# Patient Record
Sex: Female | Born: 1935
Health system: Southern US, Community
[De-identification: ages and names within clinical notes are randomized; demographics above are authoritative.]

## PROBLEM LIST (undated history)

## (undated) DIAGNOSIS — F32A Depression, unspecified: Secondary | ICD-10-CM

## (undated) DIAGNOSIS — F329 Major depressive disorder, single episode, unspecified: Secondary | ICD-10-CM

## (undated) DIAGNOSIS — I639 Cerebral infarction, unspecified: Secondary | ICD-10-CM

## (undated) DIAGNOSIS — E7800 Pure hypercholesterolemia, unspecified: Secondary | ICD-10-CM

## (undated) DIAGNOSIS — M1990 Unspecified osteoarthritis, unspecified site: Secondary | ICD-10-CM

## (undated) DIAGNOSIS — I509 Heart failure, unspecified: Secondary | ICD-10-CM

## (undated) DIAGNOSIS — F424 Excoriation (skin-picking) disorder: Secondary | ICD-10-CM

## (undated) DIAGNOSIS — F419 Anxiety disorder, unspecified: Secondary | ICD-10-CM

## (undated) DIAGNOSIS — E119 Type 2 diabetes mellitus without complications: Secondary | ICD-10-CM

## (undated) DIAGNOSIS — F0390 Unspecified dementia without behavioral disturbance: Secondary | ICD-10-CM

## (undated) DIAGNOSIS — C50911 Malignant neoplasm of unspecified site of right female breast: Secondary | ICD-10-CM

## (undated) DIAGNOSIS — K635 Polyp of colon: Secondary | ICD-10-CM

## (undated) DIAGNOSIS — M8580 Other specified disorders of bone density and structure, unspecified site: Secondary | ICD-10-CM

## (undated) DIAGNOSIS — M549 Dorsalgia, unspecified: Secondary | ICD-10-CM

## (undated) HISTORY — DX: Cerebral infarction, unspecified: I63.9

## (undated) HISTORY — PX: HERNIA REPAIR: SHX51

## (undated) HISTORY — DX: Major depressive disorder, single episode, unspecified: F32.9

## (undated) HISTORY — PX: APPENDECTOMY: SHX54

## (undated) HISTORY — DX: Heart failure, unspecified: I50.9

## (undated) HISTORY — DX: Pure hypercholesterolemia, unspecified: E78.00

## (undated) HISTORY — PX: BACK SURGERY: SHX140

## (undated) HISTORY — PX: BUNIONECTOMY: SHX129

## (undated) HISTORY — DX: Polyp of colon: K63.5

## (undated) HISTORY — DX: Dorsalgia, unspecified: M54.9

## (undated) HISTORY — PX: FACIAL COSMETIC SURGERY: SHX629

## (undated) HISTORY — DX: Depression, unspecified: F32.A

## (undated) HISTORY — PX: TEMPOROMANDIBULAR JOINT SURGERY: SHX35

## (undated) HISTORY — PX: SHOULDER SURGERY: SHX246

## (undated) HISTORY — PX: BREAST SURGERY: SHX581

## (undated) HISTORY — DX: Other specified disorders of bone density and structure, unspecified site: M85.80

## (undated) HISTORY — PX: KNEE ARTHROSCOPY: SUR90

## (undated) HISTORY — DX: Malignant neoplasm of unspecified site of right female breast: C50.911

## (undated) HISTORY — DX: Anxiety disorder, unspecified: F41.9

## (undated) HISTORY — DX: Type 2 diabetes mellitus without complications: E11.9

---

## 2001-03-16 ENCOUNTER — Ambulatory Visit (HOSPITAL_COMMUNITY): Admission: RE | Admit: 2001-03-16 | Discharge: 2001-03-16 | Payer: Self-pay | Admitting: Pulmonary Disease

## 2001-03-22 ENCOUNTER — Encounter: Payer: Self-pay | Admitting: Orthopedic Surgery

## 2001-03-22 ENCOUNTER — Encounter: Payer: Self-pay | Admitting: Emergency Medicine

## 2001-03-22 ENCOUNTER — Emergency Department (HOSPITAL_COMMUNITY): Admission: EM | Admit: 2001-03-22 | Discharge: 2001-03-22 | Payer: Self-pay | Admitting: Emergency Medicine

## 2002-11-19 ENCOUNTER — Emergency Department (HOSPITAL_COMMUNITY): Admission: EM | Admit: 2002-11-19 | Discharge: 2002-11-19 | Payer: Self-pay | Admitting: Emergency Medicine

## 2002-11-19 ENCOUNTER — Encounter: Payer: Self-pay | Admitting: Emergency Medicine

## 2003-06-08 ENCOUNTER — Ambulatory Visit (HOSPITAL_COMMUNITY): Admission: RE | Admit: 2003-06-08 | Discharge: 2003-06-08 | Payer: Self-pay | Admitting: Unknown Physician Specialty

## 2004-03-28 ENCOUNTER — Observation Stay (HOSPITAL_COMMUNITY): Admission: RE | Admit: 2004-03-28 | Discharge: 2004-03-29 | Payer: Self-pay | Admitting: Specialist

## 2010-05-15 ENCOUNTER — Ambulatory Visit (HOSPITAL_COMMUNITY): Admission: RE | Admit: 2010-05-15 | Discharge: 2010-05-15 | Payer: Self-pay | Admitting: Pulmonary Disease

## 2010-12-14 NOTE — Op Note (Signed)
NAME:  Amy [name], Amy [name]                       ACCOUNT NO.:  [id number]   MEDICAL RECORD NO.:  [id number]                   PATIENT TYPE:  AMB   LOCATION:  DAY                                  FACILITY:  [facility]   PHYSICIAN:  Jene Every, M.D.                 DATE OF BIRTH:  [birth year]   DATE OF PROCEDURE:  03/28/2004  DATE OF DISCHARGE:                                 OPERATIVE REPORT   PREOPERATIVE DIAGNOSES:  Rotator cuff tear, AC arthrosis right shoulder.   POSTOPERATIVE DIAGNOSES:  Rotator cuff tear, AC arthrosis right shoulder.   PROCEDURE:  1.  Open rotator cuff repair.  2.  Distal clavicle resection.  3.  Subacromial decompression and acromioplasty.   ANESTHESIA:  General.   ASSISTANT:  Roma Schanz, P.A.   INDICATIONS FOR PROCEDURE:  A 76 year old with AC arthrosis, rotator cuff  tear refractory to conservative treatment confirmed with an MRI.  Operative  intervention was indicated for resection of the distal clavicle,  acromioplasty, rotator cuff repair.  The risks and benefits were discussed  including bleeding, infection, damage to vascular structures, suboptimal  range of motion, retear, persistent pain, etc.   TECHNIQUE:  The patient in the supine position, after an adequate level of  general anesthesia and 1 g of Kefzol, the right shoulder and upper extremity  was prepped and draped in the usual sterile fashion. An incision was made  over the anterior aspect of the acromion, Longer's line, subcutaneous tissue  was dissected electrocautery utilized to achieve hemostasis. The raphe  between the anterior and lateral heads of the deltoid was identified,  divided up over the [facility] joint and over the CA ligament.  The capsule of the  [facility] joint was incised, we subperiosteally elevated and skeletonized the  distal clavicle. It was markedly arthritic, the [facility] joint was. I removed the  distal 0.5 cm of the clavicle with the oscillating saw, it was undercut  inferiorly  with a 3 mm Kerrison. There was a large osteophyte projecting  inferiorly. There was an osteophyte off the medial aspect of the acromion,  this was removed as well. The rotator cuff there was torn.  Copiously  irrigated at that point and put some bone wax on the distal clavicle.  Next,  we detached the CA ligament and it was hypertrophic, it was excised, there  was an anterior spur off the acromion and oscillating saw followed by a high  speed bur was utilized to perform an acromioplasty to a near type 1.  Hypertrophic bursa was noted, this was excised, we digitally mobilized the  subacromial space, there was extensive adhesions noted. This freed it up  significantly.  Tear on the supraspinatus longitudinally in the mid portion  at its attachment was noted.  It was about a centimeter and a half in  length, it was frayed and we excised this area to good bleeding tissue and  repaired it  side to side with #1 Vicryl interrupted figure-of-eight sutures.  Following this, we inspected the rest of the cup and there was no evidence  of tear, good range of motion. I copiously irrigated it and then we repaired  the raphe with #1 Vicryl interrupted figure-of-eight sutures over top of the  acromion.  We repaired the capsule and the deltotrapezial fascia over the [facility]  joint.  Excellent repair was noted, the subcutaneous tissue reapproximated  with 2-0 Vicryl simple suture, skin was reapproximated with 4-0 subcuticular  Prolene. The wound  was reinforced with Steri-Strips.  Sterile dressings applied. She was placed  in an abduction pillow, extubated without difficulty, transported to the  recovery room in satisfactory condition. The patient tolerated the procedure  well with no complications.                                               Jene Every, M.D.    Cordelia Pen  D:  03/28/2004  T:  03/28/2004  Job:  [id number]

## 2011-04-12 ENCOUNTER — Other Ambulatory Visit (HOSPITAL_COMMUNITY): Payer: Self-pay | Admitting: Pulmonary Disease

## 2011-04-12 ENCOUNTER — Ambulatory Visit (HOSPITAL_COMMUNITY)
Admission: RE | Admit: 2011-04-12 | Discharge: 2011-04-12 | Disposition: A | Discharge status: Home / Self Care | Payer: Medicare Other | Source: Ambulatory Visit | Attending: Pulmonary Disease | Admitting: Pulmonary Disease

## 2011-04-12 DIAGNOSIS — M25579 Pain in unspecified ankle and joints of unspecified foot: Secondary | ICD-10-CM | POA: Insufficient documentation

## 2011-04-12 DIAGNOSIS — M79672 Pain in left foot: Secondary | ICD-10-CM

## 2011-07-31 ENCOUNTER — Other Ambulatory Visit (HOSPITAL_COMMUNITY): Payer: Self-pay | Admitting: Pulmonary Disease

## 2011-07-31 DIAGNOSIS — R51 Headache: Secondary | ICD-10-CM

## 2011-08-01 ENCOUNTER — Ambulatory Visit (HOSPITAL_COMMUNITY)
Admission: RE | Admit: 2011-08-01 | Discharge: 2011-08-01 | Disposition: A | Discharge status: Home / Self Care | Payer: Medicare Other | Source: Ambulatory Visit | Attending: Pulmonary Disease | Admitting: Pulmonary Disease

## 2011-08-01 DIAGNOSIS — R51 Headache: Secondary | ICD-10-CM | POA: Insufficient documentation

## 2011-08-01 DIAGNOSIS — R4182 Altered mental status, unspecified: Secondary | ICD-10-CM | POA: Insufficient documentation

## 2012-03-02 ENCOUNTER — Other Ambulatory Visit (HOSPITAL_COMMUNITY): Payer: Self-pay | Admitting: Physical Medicine and Rehabilitation

## 2012-03-02 ENCOUNTER — Ambulatory Visit (HOSPITAL_COMMUNITY)
Admission: RE | Admit: 2012-03-02 | Discharge: 2012-03-02 | Disposition: A | Discharge status: Home / Self Care | Payer: Medicare Other | Source: Ambulatory Visit | Attending: Physical Medicine and Rehabilitation | Admitting: Physical Medicine and Rehabilitation

## 2012-03-02 DIAGNOSIS — M542 Cervicalgia: Secondary | ICD-10-CM

## 2012-03-02 DIAGNOSIS — M5380 Other specified dorsopathies, site unspecified: Secondary | ICD-10-CM | POA: Insufficient documentation

## 2012-03-02 DIAGNOSIS — G894 Chronic pain syndrome: Secondary | ICD-10-CM

## 2012-09-03 ENCOUNTER — Encounter (HOSPITAL_COMMUNITY): Payer: Self-pay | Admitting: Psychiatry

## 2012-09-03 ENCOUNTER — Ambulatory Visit (HOSPITAL_COMMUNITY): Payer: Medicare Other | Admitting: Psychiatry

## 2012-09-03 ENCOUNTER — Ambulatory Visit (INDEPENDENT_AMBULATORY_CARE_PROVIDER_SITE_OTHER): Payer: Medicare Other | Admitting: Psychiatry

## 2012-09-03 VITALS — Wt 189.0 lb

## 2012-09-03 DIAGNOSIS — F418 Other specified anxiety disorders: Secondary | ICD-10-CM

## 2012-09-03 DIAGNOSIS — R52 Pain, unspecified: Secondary | ICD-10-CM

## 2012-09-03 DIAGNOSIS — F332 Major depressive disorder, recurrent severe without psychotic features: Secondary | ICD-10-CM

## 2012-09-03 DIAGNOSIS — F5105 Insomnia due to other mental disorder: Secondary | ICD-10-CM

## 2012-09-03 DIAGNOSIS — F411 Generalized anxiety disorder: Secondary | ICD-10-CM

## 2012-09-03 MED ORDER — GABAPENTIN 100 MG PO CAPS: 100.0000 mg | ORAL_CAPSULE | Freq: Three times a day (TID) | ORAL | Status: DC

## 2012-09-03 MED ORDER — LIDOCAINE 5 % EX PTCH: 1.0000 | MEDICATED_PATCH | Freq: Two times a day (BID) | CUTANEOUS | Status: DC

## 2012-09-03 MED ORDER — MELOXICAM 7.5 MG PO TABS: 7.5000 mg | ORAL_TABLET | Freq: Every day | ORAL | Status: DC

## 2012-09-03 MED ORDER — BUPROPION HCL ER (SR) 150 MG PO TB12: 150.0000 mg | ORAL_TABLET | Freq: Two times a day (BID) | ORAL | Status: DC

## 2012-09-03 MED ORDER — ESCITALOPRAM OXALATE 10 MG PO TABS: 10.0000 mg | ORAL_TABLET | Freq: Every day | ORAL | Status: DC
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
## 2012-09-03 NOTE — Patient Instructions (Signed)
Yoga is a very helpful exercise method.  On TV or on line [employer] is a source of high quality information about yoga and videos on yoga.  Renee Ramus is the world's number one video yoga instructor according to some experts.  There are exceptional health benefits that can be achieved through yoga.  The main principles of yoga is acceptance, no competition, no comparison, and no judgement.  It is exceptional in helping people meditate and get to a very relaxed state.   Relaxation is the ultimate solution for you.  You can seek it through tub baths, bubble baths, essential oils or incense, walking or chatting with friends, listening to soft music, watching a candle burn and just letting all thoughts go and appreciating the true essence of the Creator.   Strongly consider attending at least 6 Alanon Meetings to help you learn about how your helping others to the exclusion of helping yourself is actually hurting yourself and is actually an addiction to fixing others and that you need to work the 12 Step to Happiness through the [employer]. Al-Anon Family Groups could be helpful with how to deal with substance abusing family and friends. Or your own issues of being in victim role.  There are only 40 Alanon Family Group meetings a week here in [place].  Online are current listing of those meetings @ [email or web address]  There are [employer].  Search on line and there you can learn the format and can access the schedule for yourself.  Their number is [phone]  Call if problems or concerns.

## 2012-10-16 ENCOUNTER — Encounter (HOSPITAL_COMMUNITY): Payer: Self-pay | Admitting: Psychiatry

## 2012-10-16 ENCOUNTER — Ambulatory Visit (INDEPENDENT_AMBULATORY_CARE_PROVIDER_SITE_OTHER): Payer: 59 | Admitting: Psychiatry

## 2012-10-16 VITALS — Wt 193.0 lb

## 2012-10-16 DIAGNOSIS — F332 Major depressive disorder, recurrent severe without psychotic features: Secondary | ICD-10-CM

## 2012-10-16 DIAGNOSIS — F418 Other specified anxiety disorders: Secondary | ICD-10-CM

## 2012-10-16 DIAGNOSIS — F411 Generalized anxiety disorder: Secondary | ICD-10-CM

## 2012-10-16 DIAGNOSIS — R52 Pain, unspecified: Secondary | ICD-10-CM

## 2012-10-16 DIAGNOSIS — F5105 Insomnia due to other mental disorder: Secondary | ICD-10-CM

## 2012-10-16 DIAGNOSIS — E559 Vitamin D deficiency, unspecified: Secondary | ICD-10-CM

## 2012-10-16 MED ORDER — LIDOCAINE 5 % EX PTCH: 2.0000 | MEDICATED_PATCH | Freq: Two times a day (BID) | CUTANEOUS | Status: DC

## 2012-10-16 MED ORDER — METHOCARBAMOL 500 MG PO TABS: 500.0000 mg | ORAL_TABLET | Freq: Four times a day (QID) | ORAL | Status: DC

## 2012-10-16 MED ORDER — MELOXICAM 7.5 MG PO TABS: 7.5000 mg | ORAL_TABLET | Freq: Every day | ORAL | Status: DC

## 2012-10-16 MED ORDER — GABAPENTIN 100 MG PO CAPS: 100.0000 mg | ORAL_CAPSULE | Freq: Four times a day (QID) | ORAL | Status: DC
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
## 2012-10-16 NOTE — Patient Instructions (Addendum)
"  Native Wisdom for [employer]" by Camelia Phenes is a book that could be very helpful.  Some have gotten it on [employer] for very low cost.  Adult children of alcoholics has some amazing literature available on line.  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  Counseling talking really does help.  Get an appointment soon.  Call if problems or concerns.

## 2012-10-20 LAB — VITAMIN D 1,25 DIHYDROXY
Vitamin D 1, 25 (OH)2 Total: 48 pg/mL (ref 18–72)
Vitamin D2 1, 25 (OH)2: <8 pg/mL
Vitamin D3 1, 25 (OH)2: 48 pg/mL

## 2012-10-22 ENCOUNTER — Ambulatory Visit (INDEPENDENT_AMBULATORY_CARE_PROVIDER_SITE_OTHER): Payer: 59 | Admitting: Psychology

## 2012-10-22 DIAGNOSIS — F489 Nonpsychotic mental disorder, unspecified: Secondary | ICD-10-CM

## 2012-10-22 DIAGNOSIS — F341 Dysthymic disorder: Secondary | ICD-10-CM

## 2012-10-22 DIAGNOSIS — F418 Other specified anxiety disorders: Secondary | ICD-10-CM

## 2012-10-22 DIAGNOSIS — F5105 Insomnia due to other mental disorder: Secondary | ICD-10-CM

## 2012-11-05 ENCOUNTER — Encounter (HOSPITAL_COMMUNITY): Payer: Self-pay | Admitting: Psychology

## 2012-11-05 ENCOUNTER — Ambulatory Visit (INDEPENDENT_AMBULATORY_CARE_PROVIDER_SITE_OTHER): Payer: 59 | Admitting: Psychology

## 2012-11-05 DIAGNOSIS — R52 Pain, unspecified: Secondary | ICD-10-CM

## 2012-11-05 DIAGNOSIS — F418 Other specified anxiety disorders: Secondary | ICD-10-CM

## 2012-11-05 DIAGNOSIS — F5105 Insomnia due to other mental disorder: Secondary | ICD-10-CM

## 2012-11-05 DIAGNOSIS — F341 Dysthymic disorder: Secondary | ICD-10-CM

## 2012-11-05 DIAGNOSIS — F489 Nonpsychotic mental disorder, unspecified: Secondary | ICD-10-CM
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
## 2012-11-06 ENCOUNTER — Ambulatory Visit (HOSPITAL_COMMUNITY): Payer: Self-pay | Admitting: Psychology

## 2012-11-12 ENCOUNTER — Ambulatory Visit (INDEPENDENT_AMBULATORY_CARE_PROVIDER_SITE_OTHER): Payer: 59 | Admitting: Psychiatry

## 2012-11-12 ENCOUNTER — Encounter (HOSPITAL_COMMUNITY): Payer: Self-pay | Admitting: Psychiatry

## 2012-11-12 VITALS — Wt 194.2 lb

## 2012-11-12 DIAGNOSIS — F5105 Insomnia due to other mental disorder: Secondary | ICD-10-CM

## 2012-11-12 DIAGNOSIS — R52 Pain, unspecified: Secondary | ICD-10-CM

## 2012-11-12 DIAGNOSIS — F332 Major depressive disorder, recurrent severe without psychotic features: Secondary | ICD-10-CM

## 2012-11-12 DIAGNOSIS — F418 Other specified anxiety disorders: Secondary | ICD-10-CM

## 2012-11-12 DIAGNOSIS — F411 Generalized anxiety disorder: Secondary | ICD-10-CM

## 2012-11-12 MED ORDER — GABAPENTIN 100 MG PO CAPS: 100.0000 mg | ORAL_CAPSULE | Freq: Four times a day (QID) | ORAL | Status: DC

## 2012-11-12 MED ORDER — LIDOCAINE 5 % EX PTCH: 2.0000 | MEDICATED_PATCH | Freq: Two times a day (BID) | CUTANEOUS | Status: DC

## 2012-11-12 MED ORDER — BUPROPION HCL ER (SR) 150 MG PO TB12: 150.0000 mg | ORAL_TABLET | Freq: Two times a day (BID) | ORAL | Status: DC

## 2012-11-12 MED ORDER — ESCITALOPRAM OXALATE 10 MG PO TABS: 10.0000 mg | ORAL_TABLET | Freq: Every day | ORAL | Status: DC

## 2012-11-12 MED ORDER — MELOXICAM 7.5 MG PO TABS: 7.5000 mg | ORAL_TABLET | Freq: Every day | ORAL | Status: AC

## 2012-11-12 NOTE — Patient Instructions (Addendum)
Adult Children of Alcoholics has some amazing literature available on line.  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  It could be very helpful to get the workbook and work in that to learn how you have come to be where you are.  Strongly consider attending at least 6 Alanon Meetings to help you learn about how your helping others to the exclusion of helping yourself is actually hurting yourself and is actually an addiction to fixing others and that you need to work the 12 Step to Happiness through the [employer]. Al-Anon Family Groups could be helpful with how to deal with substance abusing family and friends. Or your own issues of being in victim role.  There are only 40 Alanon Family Group meetings a week here in [place].  Online are current listing of those meetings @ [email or web address]  There are [employer].  Search on line and there you can learn the format and can access the schedule for yourself.  Their number is [phone]  Relaxation is the ultimate solution for you.  You can seek it through tub baths, bubble baths, essential oils or incense, walking or chatting with friends, listening to soft music, watching a candle burn and just letting all thoughts go and appreciating the true essence of the Creator.  Pets or animals may be very helpful.  You might spend some time with them and then go do more directed meditation.  Yoga is a very helpful exercise method.  On TV, on line, or by DVD Adelfa Koh is a source of high quality information about yoga and videos on yoga.  Renee Ramus is the world's number one video yoga instructor according to some experts.  There are exceptional health benefits that can be achieved through yoga.  The main principles of yoga is acceptance, no competition, no comparison, and no judgement.  It is exceptional in helping people meditate and get to a very relaxed state.   Take care of yourself.  No one else is standing up to do the job and only you  know what you need.   GET SERIOUS about taking care of yourself.  Do the next right thing and that often means doing something to care for yourself along the lines of are you hungry, are you angry, are you lonely, are you tired, are you scared?  HALTS is what that stands for.  Call if problems or concerns.
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
## 2012-11-30 ENCOUNTER — Ambulatory Visit (INDEPENDENT_AMBULATORY_CARE_PROVIDER_SITE_OTHER): Payer: 59 | Admitting: Psychology

## 2012-11-30 DIAGNOSIS — F5105 Insomnia due to other mental disorder: Secondary | ICD-10-CM

## 2012-11-30 DIAGNOSIS — F341 Dysthymic disorder: Secondary | ICD-10-CM

## 2012-11-30 DIAGNOSIS — R52 Pain, unspecified: Secondary | ICD-10-CM

## 2012-11-30 DIAGNOSIS — F418 Other specified anxiety disorders: Secondary | ICD-10-CM

## 2012-11-30 DIAGNOSIS — F489 Nonpsychotic mental disorder, unspecified: Secondary | ICD-10-CM

## 2012-12-09 ENCOUNTER — Encounter (HOSPITAL_COMMUNITY): Payer: Self-pay | Admitting: Psychology
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
## 2012-12-14 ENCOUNTER — Ambulatory Visit (INDEPENDENT_AMBULATORY_CARE_PROVIDER_SITE_OTHER): Payer: 59 | Admitting: Psychology

## 2012-12-14 DIAGNOSIS — R52 Pain, unspecified: Secondary | ICD-10-CM

## 2012-12-14 DIAGNOSIS — F341 Dysthymic disorder: Secondary | ICD-10-CM

## 2012-12-14 DIAGNOSIS — F489 Nonpsychotic mental disorder, unspecified: Secondary | ICD-10-CM

## 2012-12-14 DIAGNOSIS — F418 Other specified anxiety disorders: Secondary | ICD-10-CM

## 2012-12-14 DIAGNOSIS — F5105 Insomnia due to other mental disorder: Secondary | ICD-10-CM

## 2012-12-28 ENCOUNTER — Encounter (HOSPITAL_COMMUNITY): Payer: Self-pay | Admitting: Psychology

## 2012-12-28 ENCOUNTER — Ambulatory Visit (INDEPENDENT_AMBULATORY_CARE_PROVIDER_SITE_OTHER): Payer: 59 | Admitting: Psychology

## 2012-12-28 DIAGNOSIS — F489 Nonpsychotic mental disorder, unspecified: Secondary | ICD-10-CM

## 2012-12-28 DIAGNOSIS — R52 Pain, unspecified: Secondary | ICD-10-CM

## 2012-12-28 DIAGNOSIS — F418 Other specified anxiety disorders: Secondary | ICD-10-CM

## 2012-12-28 DIAGNOSIS — F5105 Insomnia due to other mental disorder: Secondary | ICD-10-CM

## 2012-12-28 DIAGNOSIS — F341 Dysthymic disorder: Secondary | ICD-10-CM
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
## 2013-01-07 ENCOUNTER — Encounter (HOSPITAL_COMMUNITY): Payer: Self-pay | Admitting: Psychology
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
## 2013-01-12 ENCOUNTER — Ambulatory Visit (INDEPENDENT_AMBULATORY_CARE_PROVIDER_SITE_OTHER): Payer: 59 | Admitting: Psychiatry

## 2013-01-12 ENCOUNTER — Encounter (HOSPITAL_COMMUNITY): Payer: Self-pay | Admitting: Psychiatry

## 2013-01-12 VITALS — BP 125/80 | Ht 63.5 in | Wt 190.4 lb

## 2013-01-12 DIAGNOSIS — F5105 Insomnia due to other mental disorder: Secondary | ICD-10-CM

## 2013-01-12 DIAGNOSIS — F418 Other specified anxiety disorders: Secondary | ICD-10-CM

## 2013-01-12 DIAGNOSIS — F411 Generalized anxiety disorder: Secondary | ICD-10-CM

## 2013-01-12 DIAGNOSIS — R52 Pain, unspecified: Secondary | ICD-10-CM

## 2013-01-12 DIAGNOSIS — F332 Major depressive disorder, recurrent severe without psychotic features: Secondary | ICD-10-CM

## 2013-01-12 MED ORDER — ESCITALOPRAM OXALATE 10 MG PO TABS: 10.0000 mg | ORAL_TABLET | Freq: Every day | ORAL | Status: DC

## 2013-01-12 MED ORDER — BUPROPION HCL ER (SR) 150 MG PO TB12: 150.0000 mg | ORAL_TABLET | Freq: Two times a day (BID) | ORAL | Status: DC

## 2013-01-12 MED ORDER — GABAPENTIN 100 MG PO CAPS: 100.0000 mg | ORAL_CAPSULE | Freq: Four times a day (QID) | ORAL | Status: DC

## 2013-01-12 MED ORDER — BUPROPION HCL ER (SR) 200 MG PO TB12: 200.0000 mg | ORAL_TABLET | Freq: Two times a day (BID) | ORAL | Status: DC
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
## 2013-01-12 NOTE — Patient Instructions (Addendum)
Keep up with the increased activity in MODERATION.  Have Dr Juanetta Gosling take over prescribing the Mobic.  Call if problems or concerns.

## 2013-01-20 ENCOUNTER — Ambulatory Visit (HOSPITAL_COMMUNITY): Payer: Self-pay | Admitting: Psychology

## 2013-01-26 ENCOUNTER — Ambulatory Visit (INDEPENDENT_AMBULATORY_CARE_PROVIDER_SITE_OTHER): Payer: 59 | Admitting: Psychology

## 2013-01-26 DIAGNOSIS — F341 Dysthymic disorder: Secondary | ICD-10-CM

## 2013-01-26 DIAGNOSIS — F418 Other specified anxiety disorders: Secondary | ICD-10-CM

## 2013-01-26 DIAGNOSIS — F489 Nonpsychotic mental disorder, unspecified: Secondary | ICD-10-CM

## 2013-01-26 DIAGNOSIS — F5105 Insomnia due to other mental disorder: Secondary | ICD-10-CM

## 2013-02-09 ENCOUNTER — Ambulatory Visit (INDEPENDENT_AMBULATORY_CARE_PROVIDER_SITE_OTHER): Payer: 59 | Admitting: Psychology

## 2013-02-09 DIAGNOSIS — F418 Other specified anxiety disorders: Secondary | ICD-10-CM

## 2013-02-09 DIAGNOSIS — R52 Pain, unspecified: Secondary | ICD-10-CM

## 2013-02-09 DIAGNOSIS — F5105 Insomnia due to other mental disorder: Secondary | ICD-10-CM

## 2013-02-09 DIAGNOSIS — F489 Nonpsychotic mental disorder, unspecified: Secondary | ICD-10-CM

## 2013-02-09 DIAGNOSIS — F341 Dysthymic disorder: Secondary | ICD-10-CM

## 2013-02-12 ENCOUNTER — Encounter (HOSPITAL_COMMUNITY): Payer: Self-pay | Admitting: Psychology
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
## 2013-02-23 ENCOUNTER — Encounter (HOSPITAL_COMMUNITY): Payer: Self-pay | Admitting: Psychology

## 2013-02-23 ENCOUNTER — Ambulatory Visit (INDEPENDENT_AMBULATORY_CARE_PROVIDER_SITE_OTHER): Payer: 59 | Admitting: Psychology

## 2013-02-23 DIAGNOSIS — F418 Other specified anxiety disorders: Secondary | ICD-10-CM

## 2013-02-23 DIAGNOSIS — F5105 Insomnia due to other mental disorder: Secondary | ICD-10-CM

## 2013-02-23 DIAGNOSIS — F489 Nonpsychotic mental disorder, unspecified: Secondary | ICD-10-CM

## 2013-02-23 DIAGNOSIS — F341 Dysthymic disorder: Secondary | ICD-10-CM

## 2013-02-23 DIAGNOSIS — R52 Pain, unspecified: Secondary | ICD-10-CM
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
## 2013-03-09 ENCOUNTER — Ambulatory Visit (INDEPENDENT_AMBULATORY_CARE_PROVIDER_SITE_OTHER): Payer: 59 | Admitting: Psychology

## 2013-03-09 DIAGNOSIS — R52 Pain, unspecified: Secondary | ICD-10-CM

## 2013-03-09 DIAGNOSIS — F418 Other specified anxiety disorders: Secondary | ICD-10-CM

## 2013-03-09 DIAGNOSIS — F341 Dysthymic disorder: Secondary | ICD-10-CM

## 2013-03-09 DIAGNOSIS — F5105 Insomnia due to other mental disorder: Secondary | ICD-10-CM

## 2013-03-09 DIAGNOSIS — F489 Nonpsychotic mental disorder, unspecified: Secondary | ICD-10-CM

## 2013-03-11 ENCOUNTER — Encounter (HOSPITAL_COMMUNITY): Payer: Self-pay | Admitting: Psychology
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
## 2013-03-30 ENCOUNTER — Ambulatory Visit (INDEPENDENT_AMBULATORY_CARE_PROVIDER_SITE_OTHER): Payer: 59 | Admitting: Psychology

## 2013-03-30 DIAGNOSIS — F489 Nonpsychotic mental disorder, unspecified: Secondary | ICD-10-CM

## 2013-03-30 DIAGNOSIS — R52 Pain, unspecified: Secondary | ICD-10-CM

## 2013-03-30 DIAGNOSIS — F341 Dysthymic disorder: Secondary | ICD-10-CM

## 2013-03-30 DIAGNOSIS — F5105 Insomnia due to other mental disorder: Secondary | ICD-10-CM

## 2013-03-30 DIAGNOSIS — F418 Other specified anxiety disorders: Secondary | ICD-10-CM

## 2013-04-06 ENCOUNTER — Ambulatory Visit (INDEPENDENT_AMBULATORY_CARE_PROVIDER_SITE_OTHER): Payer: 59 | Admitting: Psychiatry

## 2013-04-06 ENCOUNTER — Encounter (HOSPITAL_COMMUNITY): Payer: Self-pay | Admitting: Psychiatry

## 2013-04-06 VITALS — BP 160/100 | Ht 63.0 in | Wt 191.0 lb

## 2013-04-06 DIAGNOSIS — F5105 Insomnia due to other mental disorder: Secondary | ICD-10-CM

## 2013-04-06 DIAGNOSIS — F418 Other specified anxiety disorders: Secondary | ICD-10-CM

## 2013-04-06 DIAGNOSIS — F411 Generalized anxiety disorder: Secondary | ICD-10-CM

## 2013-04-06 DIAGNOSIS — F332 Major depressive disorder, recurrent severe without psychotic features: Secondary | ICD-10-CM

## 2013-04-06 MED ORDER — ESCITALOPRAM OXALATE 20 MG PO TABS: 20.0000 mg | ORAL_TABLET | Freq: Every day | ORAL | Status: DC

## 2013-04-06 MED ORDER — GABAPENTIN 100 MG PO CAPS: 100.0000 mg | ORAL_CAPSULE | Freq: Four times a day (QID) | ORAL | Status: DC

## 2013-04-06 MED ORDER — BUPROPION HCL ER (SR) 200 MG PO TB12: 200.0000 mg | ORAL_TABLET | Freq: Two times a day (BID) | ORAL | Status: DC
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
## 2013-04-08 ENCOUNTER — Ambulatory Visit (HOSPITAL_COMMUNITY): Payer: Self-pay | Admitting: Psychiatry

## 2013-04-20 ENCOUNTER — Ambulatory Visit (HOSPITAL_COMMUNITY): Payer: Self-pay | Admitting: Psychology

## 2013-04-22 ENCOUNTER — Ambulatory Visit (INDEPENDENT_AMBULATORY_CARE_PROVIDER_SITE_OTHER): Payer: 59 | Admitting: Psychology

## 2013-04-22 DIAGNOSIS — F489 Nonpsychotic mental disorder, unspecified: Secondary | ICD-10-CM

## 2013-04-22 DIAGNOSIS — F341 Dysthymic disorder: Secondary | ICD-10-CM

## 2013-04-22 DIAGNOSIS — F5105 Insomnia due to other mental disorder: Secondary | ICD-10-CM

## 2013-04-22 DIAGNOSIS — F418 Other specified anxiety disorders: Secondary | ICD-10-CM

## 2013-05-05 ENCOUNTER — Ambulatory Visit (INDEPENDENT_AMBULATORY_CARE_PROVIDER_SITE_OTHER): Payer: Medicare Other | Admitting: Podiatry

## 2013-05-05 ENCOUNTER — Encounter: Payer: Self-pay | Admitting: Podiatry

## 2013-05-05 VITALS — BP 162/94 | HR 70 | Resp 16

## 2013-05-05 DIAGNOSIS — M7750 Other enthesopathy of unspecified foot: Secondary | ICD-10-CM

## 2013-05-05 NOTE — Progress Notes (Signed)
Subjective:     Patient ID: Amy [name], female   DOB: 1935-12-10, 77 y.o.   MRN: [id number]  HPI patient states her heel is feeling much better. States that she does not wear her orthotics she will notice some recurrence of pain.   Review of Systems  All other systems reviewed and are negative.       Objective:   Physical Exam  Nursing note and vitals reviewed. Cardiovascular: Intact distal pulses.    Neurological intact bilateral. Minimal discomfort to the plantar fascia right when palpated.    Assessment:     Improving plantar fasciitis right with combination of physical therapy and orthotics.    Plan:     H&P performed. Advised on continued orthotic usage and physical therapy. Continue supportive shoes.

## 2013-05-05 NOTE — Patient Instructions (Signed)
Call with any problems

## 2013-05-06 ENCOUNTER — Ambulatory Visit (INDEPENDENT_AMBULATORY_CARE_PROVIDER_SITE_OTHER): Payer: 59 | Admitting: Psychiatry

## 2013-05-06 ENCOUNTER — Encounter (HOSPITAL_COMMUNITY): Payer: Self-pay | Admitting: Psychiatry

## 2013-05-06 VITALS — BP 140/88 | Ht 63.0 in | Wt 190.0 lb

## 2013-05-06 DIAGNOSIS — F418 Other specified anxiety disorders: Secondary | ICD-10-CM

## 2013-05-06 DIAGNOSIS — F411 Generalized anxiety disorder: Secondary | ICD-10-CM

## 2013-05-06 DIAGNOSIS — F332 Major depressive disorder, recurrent severe without psychotic features: Secondary | ICD-10-CM
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
## 2013-05-13 ENCOUNTER — Encounter (HOSPITAL_COMMUNITY): Payer: Self-pay | Admitting: Psychology
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
## 2013-05-21 ENCOUNTER — Ambulatory Visit (INDEPENDENT_AMBULATORY_CARE_PROVIDER_SITE_OTHER): Payer: 59 | Admitting: Psychology

## 2013-05-21 DIAGNOSIS — F418 Other specified anxiety disorders: Secondary | ICD-10-CM

## 2013-05-21 DIAGNOSIS — F489 Nonpsychotic mental disorder, unspecified: Secondary | ICD-10-CM

## 2013-05-21 DIAGNOSIS — F341 Dysthymic disorder: Secondary | ICD-10-CM

## 2013-05-21 DIAGNOSIS — F5105 Insomnia due to other mental disorder: Secondary | ICD-10-CM

## 2013-06-15 ENCOUNTER — Encounter (HOSPITAL_COMMUNITY): Payer: Self-pay | Admitting: Psychology
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
## 2013-06-21 ENCOUNTER — Encounter (HOSPITAL_COMMUNITY): Payer: Self-pay | Admitting: Psychology

## 2013-06-21 ENCOUNTER — Ambulatory Visit (INDEPENDENT_AMBULATORY_CARE_PROVIDER_SITE_OTHER): Payer: 59 | Admitting: Psychology

## 2013-06-21 DIAGNOSIS — F341 Dysthymic disorder: Secondary | ICD-10-CM

## 2013-06-21 DIAGNOSIS — F489 Nonpsychotic mental disorder, unspecified: Secondary | ICD-10-CM

## 2013-06-21 DIAGNOSIS — F418 Other specified anxiety disorders: Secondary | ICD-10-CM

## 2013-06-21 DIAGNOSIS — F5105 Insomnia due to other mental disorder: Secondary | ICD-10-CM
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
## 2013-06-22 ENCOUNTER — Encounter (HOSPITAL_COMMUNITY): Payer: Self-pay | Admitting: Psychology
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
## 2013-07-06 ENCOUNTER — Encounter (HOSPITAL_COMMUNITY): Payer: Self-pay | Admitting: Psychiatry

## 2013-07-06 ENCOUNTER — Ambulatory Visit (INDEPENDENT_AMBULATORY_CARE_PROVIDER_SITE_OTHER): Payer: 59 | Admitting: Psychiatry

## 2013-07-06 VITALS — BP 150/88 | Ht 63.0 in | Wt 190.0 lb

## 2013-07-06 DIAGNOSIS — F332 Major depressive disorder, recurrent severe without psychotic features: Secondary | ICD-10-CM

## 2013-07-06 DIAGNOSIS — F411 Generalized anxiety disorder: Secondary | ICD-10-CM

## 2013-07-06 DIAGNOSIS — F418 Other specified anxiety disorders: Secondary | ICD-10-CM

## 2013-07-06 DIAGNOSIS — F5105 Insomnia due to other mental disorder: Secondary | ICD-10-CM

## 2013-07-06 MED ORDER — ESCITALOPRAM OXALATE 20 MG PO TABS: 20.0000 mg | ORAL_TABLET | Freq: Every day | ORAL | Status: DC

## 2013-07-06 MED ORDER — BUPROPION HCL ER (SR) 200 MG PO TB12: 200.0000 mg | ORAL_TABLET | Freq: Two times a day (BID) | ORAL | Status: DC

## 2013-07-06 MED ORDER — GABAPENTIN 100 MG PO CAPS: 100.0000 mg | ORAL_CAPSULE | Freq: Four times a day (QID) | ORAL | Status: DC
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
## 2013-08-05 ENCOUNTER — Ambulatory Visit (HOSPITAL_COMMUNITY): Payer: Self-pay | Admitting: Psychology

## 2013-08-23 ENCOUNTER — Ambulatory Visit (INDEPENDENT_AMBULATORY_CARE_PROVIDER_SITE_OTHER): Payer: 59 | Admitting: Psychology

## 2013-08-23 DIAGNOSIS — F341 Dysthymic disorder: Secondary | ICD-10-CM

## 2013-08-23 DIAGNOSIS — R52 Pain, unspecified: Secondary | ICD-10-CM

## 2013-08-23 DIAGNOSIS — F5105 Insomnia due to other mental disorder: Secondary | ICD-10-CM

## 2013-08-23 DIAGNOSIS — F418 Other specified anxiety disorders: Secondary | ICD-10-CM

## 2013-08-23 DIAGNOSIS — F489 Nonpsychotic mental disorder, unspecified: Secondary | ICD-10-CM

## 2013-09-06 ENCOUNTER — Encounter (HOSPITAL_COMMUNITY): Payer: Self-pay | Admitting: Psychiatry

## 2013-09-06 ENCOUNTER — Ambulatory Visit (INDEPENDENT_AMBULATORY_CARE_PROVIDER_SITE_OTHER): Payer: 59 | Admitting: Psychiatry

## 2013-09-06 VITALS — BP 130/82 | Ht 63.0 in | Wt 190.0 lb

## 2013-09-06 DIAGNOSIS — F5105 Insomnia due to other mental disorder: Secondary | ICD-10-CM

## 2013-09-06 DIAGNOSIS — F332 Major depressive disorder, recurrent severe without psychotic features: Secondary | ICD-10-CM

## 2013-09-06 DIAGNOSIS — F418 Other specified anxiety disorders: Secondary | ICD-10-CM

## 2013-09-06 DIAGNOSIS — F411 Generalized anxiety disorder: Secondary | ICD-10-CM

## 2013-09-06 MED ORDER — GABAPENTIN 100 MG PO CAPS: 100.0000 mg | ORAL_CAPSULE | Freq: Four times a day (QID) | ORAL | Status: DC

## 2013-09-06 MED ORDER — METHYLPHENIDATE HCL 10 MG PO TABS: 10.0000 mg | ORAL_TABLET | Freq: Two times a day (BID) | ORAL | Status: DC

## 2013-09-06 MED ORDER — BUPROPION HCL ER (SR) 200 MG PO TB12: 200.0000 mg | ORAL_TABLET | Freq: Two times a day (BID) | ORAL | Status: DC

## 2013-09-06 MED ORDER — ESCITALOPRAM OXALATE 20 MG PO TABS: 20.0000 mg | ORAL_TABLET | Freq: Every day | ORAL | Status: DC
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
## 2013-09-13 ENCOUNTER — Telehealth (HOSPITAL_COMMUNITY): Payer: Self-pay | Admitting: *Deleted

## 2013-09-13 ENCOUNTER — Ambulatory Visit (HOSPITAL_COMMUNITY): Payer: Self-pay | Admitting: Psychology

## 2013-09-15 ENCOUNTER — Encounter (HOSPITAL_COMMUNITY): Payer: Self-pay | Admitting: Psychology
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
## 2013-10-04 ENCOUNTER — Ambulatory Visit (HOSPITAL_COMMUNITY): Payer: Self-pay | Admitting: Psychiatry

## 2013-10-05 ENCOUNTER — Telehealth (HOSPITAL_COMMUNITY): Payer: Self-pay | Admitting: *Deleted

## 2013-10-05 ENCOUNTER — Other Ambulatory Visit (HOSPITAL_COMMUNITY): Payer: Self-pay | Admitting: Psychiatry

## 2013-10-05 MED ORDER — METHYLPHENIDATE HCL 10 MG PO TABS: 10.0000 mg | ORAL_TABLET | Freq: Two times a day (BID) | ORAL | Status: DC

## 2013-10-05 NOTE — Telephone Encounter (Signed)
printed

## 2013-10-14 ENCOUNTER — Encounter (HOSPITAL_COMMUNITY): Payer: Self-pay | Admitting: Psychiatry

## 2013-10-14 ENCOUNTER — Ambulatory Visit (INDEPENDENT_AMBULATORY_CARE_PROVIDER_SITE_OTHER): Payer: 59 | Admitting: Psychiatry

## 2013-10-14 VITALS — BP 120/84 | Ht 63.0 in | Wt 193.0 lb

## 2013-10-14 DIAGNOSIS — F411 Generalized anxiety disorder: Secondary | ICD-10-CM

## 2013-10-14 DIAGNOSIS — F5105 Insomnia due to other mental disorder: Secondary | ICD-10-CM

## 2013-10-14 DIAGNOSIS — F418 Other specified anxiety disorders: Secondary | ICD-10-CM

## 2013-10-14 DIAGNOSIS — F332 Major depressive disorder, recurrent severe without psychotic features: Secondary | ICD-10-CM

## 2013-10-14 MED ORDER — ESCITALOPRAM OXALATE 20 MG PO TABS: 20.0000 mg | ORAL_TABLET | Freq: Every day | ORAL | Status: DC

## 2013-10-14 MED ORDER — GABAPENTIN 100 MG PO CAPS: 100.0000 mg | ORAL_CAPSULE | Freq: Four times a day (QID) | ORAL | Status: DC

## 2013-10-14 MED ORDER — METHYLPHENIDATE HCL 10 MG PO TABS
10.0000 mg | ORAL_TABLET | Freq: Two times a day (BID) | ORAL | Status: DC
Start: 2013-10-14 — End: 2013-10-28

## 2013-10-14 MED ORDER — BUPROPION HCL ER (SR) 200 MG PO TB12: 200.0000 mg | ORAL_TABLET | Freq: Two times a day (BID) | ORAL | Status: DC

## 2013-10-14 MED ORDER — ALPRAZOLAM 0.5 MG PO TABS: 0.5000 mg | ORAL_TABLET | Freq: Three times a day (TID) | ORAL | Status: DC
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
## 2013-10-21 ENCOUNTER — Ambulatory Visit (INDEPENDENT_AMBULATORY_CARE_PROVIDER_SITE_OTHER): Payer: 59 | Admitting: Psychology

## 2013-10-21 ENCOUNTER — Encounter (HOSPITAL_COMMUNITY): Payer: Self-pay | Admitting: Psychology

## 2013-10-21 DIAGNOSIS — F418 Other specified anxiety disorders: Secondary | ICD-10-CM

## 2013-10-21 DIAGNOSIS — F341 Dysthymic disorder: Secondary | ICD-10-CM
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
## 2013-10-28 ENCOUNTER — Ambulatory Visit (INDEPENDENT_AMBULATORY_CARE_PROVIDER_SITE_OTHER): Payer: 59 | Admitting: Psychiatry

## 2013-10-28 ENCOUNTER — Encounter (HOSPITAL_COMMUNITY): Payer: Self-pay | Admitting: Psychiatry

## 2013-10-28 VITALS — BP 130/80 | Ht 63.0 in | Wt 196.0 lb

## 2013-10-28 DIAGNOSIS — F332 Major depressive disorder, recurrent severe without psychotic features: Secondary | ICD-10-CM

## 2013-10-28 DIAGNOSIS — F418 Other specified anxiety disorders: Secondary | ICD-10-CM

## 2013-10-28 DIAGNOSIS — F411 Generalized anxiety disorder: Secondary | ICD-10-CM

## 2013-10-28 MED ORDER — METHYLPHENIDATE HCL 10 MG PO TABS: 10.0000 mg | ORAL_TABLET | Freq: Two times a day (BID) | ORAL | Status: DC
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
## 2013-11-18 ENCOUNTER — Encounter (HOSPITAL_COMMUNITY): Payer: Self-pay | Admitting: Psychology

## 2013-11-18 ENCOUNTER — Ambulatory Visit (INDEPENDENT_AMBULATORY_CARE_PROVIDER_SITE_OTHER): Payer: 59 | Admitting: Psychology

## 2013-11-18 DIAGNOSIS — F341 Dysthymic disorder: Secondary | ICD-10-CM

## 2013-11-18 DIAGNOSIS — F418 Other specified anxiety disorders: Secondary | ICD-10-CM
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
## 2013-11-25 ENCOUNTER — Encounter (HOSPITAL_COMMUNITY): Payer: Self-pay | Admitting: Psychiatry

## 2013-11-25 ENCOUNTER — Ambulatory Visit (INDEPENDENT_AMBULATORY_CARE_PROVIDER_SITE_OTHER): Payer: 59 | Admitting: Psychiatry

## 2013-11-25 VITALS — BP 140/90 | Ht 63.0 in | Wt 192.0 lb

## 2013-11-25 DIAGNOSIS — F411 Generalized anxiety disorder: Secondary | ICD-10-CM

## 2013-11-25 DIAGNOSIS — F332 Major depressive disorder, recurrent severe without psychotic features: Secondary | ICD-10-CM

## 2013-11-25 DIAGNOSIS — F418 Other specified anxiety disorders: Secondary | ICD-10-CM

## 2013-11-25 MED ORDER — METHYLPHENIDATE HCL 10 MG PO TABS: 10.0000 mg | ORAL_TABLET | Freq: Two times a day (BID) | ORAL | Status: DC
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
## 2013-11-26 ENCOUNTER — Telehealth (HOSPITAL_COMMUNITY): Payer: Self-pay | Admitting: *Deleted

## 2013-11-26 NOTE — Telephone Encounter (Signed)
noted 

## 2013-12-16 ENCOUNTER — Ambulatory Visit (INDEPENDENT_AMBULATORY_CARE_PROVIDER_SITE_OTHER): Payer: 59 | Admitting: Psychology

## 2013-12-16 ENCOUNTER — Encounter (HOSPITAL_COMMUNITY): Payer: Self-pay | Admitting: Psychology

## 2013-12-16 DIAGNOSIS — F341 Dysthymic disorder: Secondary | ICD-10-CM

## 2013-12-16 DIAGNOSIS — F5105 Insomnia due to other mental disorder: Secondary | ICD-10-CM

## 2013-12-16 DIAGNOSIS — F418 Other specified anxiety disorders: Secondary | ICD-10-CM

## 2013-12-16 DIAGNOSIS — F489 Nonpsychotic mental disorder, unspecified: Secondary | ICD-10-CM
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
## 2013-12-23 ENCOUNTER — Encounter (HOSPITAL_COMMUNITY): Payer: Self-pay | Admitting: Psychiatry

## 2013-12-23 ENCOUNTER — Ambulatory Visit (INDEPENDENT_AMBULATORY_CARE_PROVIDER_SITE_OTHER): Payer: 59 | Admitting: Psychiatry

## 2013-12-23 VITALS — BP 120/82 | Ht 63.0 in | Wt 191.0 lb

## 2013-12-23 DIAGNOSIS — F332 Major depressive disorder, recurrent severe without psychotic features: Secondary | ICD-10-CM

## 2013-12-23 DIAGNOSIS — F5105 Insomnia due to other mental disorder: Secondary | ICD-10-CM

## 2013-12-23 DIAGNOSIS — F418 Other specified anxiety disorders: Secondary | ICD-10-CM

## 2013-12-23 DIAGNOSIS — F411 Generalized anxiety disorder: Secondary | ICD-10-CM

## 2013-12-23 MED ORDER — ESCITALOPRAM OXALATE 20 MG PO TABS: 20.0000 mg | ORAL_TABLET | Freq: Every day | ORAL | Status: DC

## 2013-12-23 MED ORDER — BUPROPION HCL ER (SR) 200 MG PO TB12: 200.0000 mg | ORAL_TABLET | Freq: Two times a day (BID) | ORAL | Status: DC

## 2013-12-23 MED ORDER — GABAPENTIN 100 MG PO CAPS: 100.0000 mg | ORAL_CAPSULE | Freq: Four times a day (QID) | ORAL | Status: DC

## 2013-12-23 MED ORDER — METHYLPHENIDATE HCL 10 MG PO TABS: 10.0000 mg | ORAL_TABLET | Freq: Two times a day (BID) | ORAL | Status: DC
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
## 2014-01-13 ENCOUNTER — Ambulatory Visit (HOSPITAL_COMMUNITY): Payer: Self-pay | Admitting: Psychology

## 2014-02-18 ENCOUNTER — Ambulatory Visit (HOSPITAL_COMMUNITY): Payer: Self-pay | Admitting: Psychiatry

## 2014-02-24 ENCOUNTER — Encounter (HOSPITAL_COMMUNITY): Payer: Self-pay | Admitting: Psychology

## 2014-02-24 ENCOUNTER — Ambulatory Visit (INDEPENDENT_AMBULATORY_CARE_PROVIDER_SITE_OTHER): Payer: 59 | Admitting: Psychology

## 2014-02-24 DIAGNOSIS — F489 Nonpsychotic mental disorder, unspecified: Secondary | ICD-10-CM

## 2014-02-24 DIAGNOSIS — F341 Dysthymic disorder: Secondary | ICD-10-CM

## 2014-02-24 DIAGNOSIS — F418 Other specified anxiety disorders: Secondary | ICD-10-CM

## 2014-02-24 DIAGNOSIS — F5105 Insomnia due to other mental disorder: Secondary | ICD-10-CM
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
## 2014-02-28 ENCOUNTER — Ambulatory Visit (INDEPENDENT_AMBULATORY_CARE_PROVIDER_SITE_OTHER): Payer: 59 | Admitting: Psychiatry

## 2014-02-28 ENCOUNTER — Encounter (HOSPITAL_COMMUNITY): Payer: Self-pay | Admitting: Psychiatry

## 2014-02-28 VITALS — BP 142/90 | HR 77 | Ht 63.0 in | Wt 186.8 lb

## 2014-02-28 DIAGNOSIS — F418 Other specified anxiety disorders: Secondary | ICD-10-CM

## 2014-02-28 DIAGNOSIS — F341 Dysthymic disorder: Secondary | ICD-10-CM

## 2014-02-28 DIAGNOSIS — F489 Nonpsychotic mental disorder, unspecified: Secondary | ICD-10-CM

## 2014-02-28 DIAGNOSIS — F5105 Insomnia due to other mental disorder: Secondary | ICD-10-CM

## 2014-02-28 MED ORDER — GABAPENTIN 100 MG PO CAPS: 100.0000 mg | ORAL_CAPSULE | Freq: Four times a day (QID) | ORAL | Status: DC

## 2014-02-28 MED ORDER — METHYLPHENIDATE HCL 10 MG PO TABS: 10.0000 mg | ORAL_TABLET | Freq: Two times a day (BID) | ORAL | Status: DC

## 2014-02-28 MED ORDER — ESCITALOPRAM OXALATE 20 MG PO TABS: 20.0000 mg | ORAL_TABLET | Freq: Every day | ORAL | Status: DC

## 2014-02-28 MED ORDER — ALPRAZOLAM 0.5 MG PO TABS: 0.5000 mg | ORAL_TABLET | Freq: Three times a day (TID) | ORAL | Status: DC | PRN

## 2014-02-28 MED ORDER — BUPROPION HCL ER (SR) 200 MG PO TB12: 200.0000 mg | ORAL_TABLET | Freq: Two times a day (BID) | ORAL | Status: DC
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
## 2014-03-05 ENCOUNTER — Emergency Department (HOSPITAL_COMMUNITY)
Admission: EM | Admit: 2014-03-05 | Discharge: 2014-03-05 | Disposition: A | Discharge status: Home / Self Care | Payer: Medicare Other | Attending: Emergency Medicine | Admitting: Emergency Medicine

## 2014-03-05 ENCOUNTER — Encounter (HOSPITAL_COMMUNITY): Payer: Self-pay | Admitting: Emergency Medicine

## 2014-03-05 ENCOUNTER — Emergency Department (HOSPITAL_COMMUNITY): Payer: Medicare Other

## 2014-03-05 DIAGNOSIS — F411 Generalized anxiety disorder: Secondary | ICD-10-CM | POA: Insufficient documentation

## 2014-03-05 DIAGNOSIS — F3289 Other specified depressive episodes: Secondary | ICD-10-CM | POA: Diagnosis not present

## 2014-03-05 DIAGNOSIS — Y9389 Activity, other specified: Secondary | ICD-10-CM | POA: Diagnosis not present

## 2014-03-05 DIAGNOSIS — F329 Major depressive disorder, single episode, unspecified: Secondary | ICD-10-CM | POA: Insufficient documentation

## 2014-03-05 DIAGNOSIS — E7800 Pure hypercholesterolemia, unspecified: Secondary | ICD-10-CM | POA: Diagnosis not present

## 2014-03-05 DIAGNOSIS — S298XXA Other specified injuries of thorax, initial encounter: Secondary | ICD-10-CM | POA: Insufficient documentation

## 2014-03-05 DIAGNOSIS — Y92009 Unspecified place in unspecified non-institutional (private) residence as the place of occurrence of the external cause: Secondary | ICD-10-CM | POA: Insufficient documentation

## 2014-03-05 DIAGNOSIS — E119 Type 2 diabetes mellitus without complications: Secondary | ICD-10-CM | POA: Diagnosis not present

## 2014-03-05 DIAGNOSIS — Z860100 Personal history of colon polyps, unspecified: Secondary | ICD-10-CM | POA: Insufficient documentation

## 2014-03-05 DIAGNOSIS — W1809XA Striking against other object with subsequent fall, initial encounter: Secondary | ICD-10-CM | POA: Diagnosis not present

## 2014-03-05 DIAGNOSIS — Z79899 Other long term (current) drug therapy: Secondary | ICD-10-CM | POA: Insufficient documentation

## 2014-03-05 DIAGNOSIS — S20219A Contusion of unspecified front wall of thorax, initial encounter: Secondary | ICD-10-CM | POA: Diagnosis not present

## 2014-03-05 DIAGNOSIS — S20211A Contusion of right front wall of thorax, initial encounter: Secondary | ICD-10-CM

## 2014-03-05 DIAGNOSIS — S40029A Contusion of unspecified upper arm, initial encounter: Secondary | ICD-10-CM | POA: Insufficient documentation

## 2014-03-05 DIAGNOSIS — W010XXA Fall on same level from slipping, tripping and stumbling without subsequent striking against object, initial encounter: Secondary | ICD-10-CM | POA: Insufficient documentation

## 2014-03-05 DIAGNOSIS — Z87891 Personal history of nicotine dependence: Secondary | ICD-10-CM | POA: Insufficient documentation

## 2014-03-05 DIAGNOSIS — S40021A Contusion of right upper arm, initial encounter: Secondary | ICD-10-CM

## 2014-03-05 NOTE — ED Notes (Signed)
Patient with no complaints at this time. Respirations even and unlabored. Skin warm/dry. Discharge instructions reviewed with patient at this time. Patient given opportunity to voice concerns/ask questions. Patient discharged at this time and left Emergency Department with steady gait.   

## 2014-03-05 NOTE — ED Notes (Signed)
Pt was attempting to climb back into her bed after picking up her dog last night when she slipped off the mattress causing her to fall hitting her right elbow and right rib cage against the nightstand. Pt alert, denies any other injury, denies any sob,

## 2014-03-05 NOTE — ED Provider Notes (Signed)
CSN: [id number]     Arrival date & time 03/05/14  1203 History   First MD Initiated Contact with Patient 03/05/14 1323     Chief Complaint  Patient presents with  . Rib Injury     (Consider location/radiation/quality/duration/timing/severity/associated sxs/prior Treatment) The history is provided by the patient.   Amy [name] is a 78 y.o. female who presents to the ED with right rib pain. She states that during the middle of the night her dog got out of the bed with her and later was trying to get back in so she got up and got him. When she was getting back in the bed she slipped off the mattress and fell back and her her right upper arm and right rib area against the night stand. She denies head injury or LOC. She applied ice but came today to make sure she did not break a rib. She denies shortness of breath. She has been seeing Dr. Percell Miller and Noemi Chapel for her back and hip problems and has been getting cortisone injections for that. She has pain medication that they have given her.  Past Medical History  Diagnosis Date  . Anxiety   . Back pain   . Depression   . Hypercholesterolemia   . Colon polyps   . Diabetes mellitus, type II     diet controlled   Past Surgical History  Procedure Laterality Date  . Appendectomy    . Hernia repair    . Breast surgery    . Bunionectomy    . Facial cosmetic surgery      drooping eyelid and brow  . Temporomandibular joint surgery    . Shoulder surgery     Family History  Problem Relation Age of Onset  . Anxiety disorder Mother   . Depression Mother   . Depression Maternal Grandmother   . ADD / ADHD Neg Hx   . Bipolar disorder Neg Hx   . Dementia Neg Hx   . Drug abuse Neg Hx   . OCD Neg Hx   . Paranoid behavior Neg Hx   . Schizophrenia Neg Hx   . Seizures Neg Hx   . Sexual abuse Neg Hx   . Physical abuse Neg Hx   . Alcohol abuse Brother   . Cancer - Other Sister    History  Substance Use Topics  . Smoking status: Former Smoker     Quit date: 09/04/1995  . Smokeless tobacco: Never Used  . Alcohol Use: No   OB History   Grav Para Term Preterm Abortions TAB SAB Ect Mult Living                 Review of Systems Negative except as stated in HPI   Allergies  Cymbalta  Home Medications   Prior to Admission medications   Medication Sig Start Date End Date Taking? Authorizing Provider  ALPRAZolam Duanne Moron) 0.5 MG tablet Take 1 tablet (0.5 mg total) by mouth 3 (three) times daily as needed. 02/28/14  Yes Levonne Spiller, MD  buPROPion [facility] SR) 200 MG 12 hr tablet Take 1 tablet (200 mg total) by mouth 2 (two) times daily. Take last dose before 4 PM 02/28/14  Yes Levonne Spiller, MD  escitalopram (LEXAPRO) 20 MG tablet Take 1 tablet (20 mg total) by mouth daily. 02/28/14 02/28/15 Yes Levonne Spiller, MD  gabapentin (NEURONTIN) 100 MG capsule Take 1-2 capsules (100-200 mg total) by mouth 4 (four) times daily. Take TWO at night for insomnia 02/28/14  02/28/15 Yes Levonne Spiller, MD  lisinopril (PRINIVIL,ZESTRIL) 10 MG tablet Take 10 mg by mouth every evening.  06/28/13  Yes Historical Provider, MD  methylphenidate (RITALIN) 10 MG tablet Take 1 tablet (10 mg total) by mouth 2 (two) times daily with breakfast and lunch. 02/28/14 02/28/15 Yes Levonne Spiller, MD  oxyCODONE-acetaminophen (PERCOCET/ROXICET) 5-325 MG per tablet Take 1 tablet by mouth every 6 (six) hours as needed for moderate pain or severe pain.   Yes Historical Provider, MD  simvastatin (ZOCOR) 40 MG tablet Take 40 mg by mouth at bedtime.  08/27/12  Yes Historical Provider, MD   BP 138/87  Pulse 71  Temp(Src) 98 F (36.7 C) (Oral)  Resp 16  Ht 5\' 4"  (1.626 m)  Wt 186 lb (84.369 kg)  BMI 31.91 kg/m2  SpO2 99% Physical Exam  Nursing note and vitals reviewed. Constitutional: She is oriented to person, place, and time. She appears well-developed and well-nourished. No distress.  HENT:  Head: Normocephalic.  Eyes: EOM are normal.  Neck: Neck supple.  Cardiovascular: Normal  rate.   Pulmonary/Chest: Effort normal.  Musculoskeletal: Normal range of motion.  Radial pulses equal, adequate circulation, good grips, good touch sensation. Tender with palpation to the upper right arm, ecchymosis noted. Full range of motion of wrist, elbow and shoulder without pain. Tender right anterior rib area.   Neurological: She is alert and oriented to person, place, and time. No cranial nerve deficit.  Ambulatory without difficulty.  Skin: Skin is warm and dry.  Psychiatric: She has a normal mood and affect. Her behavior is normal.    ED Course  Procedures (including critical care time) Labs Review Labs Reviewed - No data to display  Imaging Review Dg Ribs Unilateral W/chest Right  03/05/2014   CLINICAL DATA:  [place] out of the bed last night. Pain in the anterior mid ribs under the right breast.  EXAM: RIGHT RIBS AND CHEST - 3+ VIEW  COMPARISON:  03/28/2004  FINDINGS: No fracture or other bone lesions are seen involving the ribs. There is no evidence of pneumothorax or pleural effusion. Both lungs are clear. Heart size and mediastinal contours are within normal limits.  IMPRESSION: Negative.   Electronically Signed   By: Shon Hale M.D.   On: 03/05/2014 13:23    MDM  78 y.o. female with contusion to the right upper arm and right rib area s/p fall. She will take her medication as needed that she has from her orthopedic doctor. She will apply ice to the area and follow up with Dr. Noemi Chapel if needed. Stable for discharge without neurovascular deficits. I have reviewed this patient's vital signs, nurses notes, appropriate imaging and discussed findings and plan of care with the patient. She voices understanding.     [place], [place] 03/05/14 1530

## 2014-03-06 NOTE — ED Provider Notes (Signed)
Medical screening examination/treatment/procedure(s) were performed by non-physician practitioner and as supervising physician I was immediately available for consultation/collaboration.   EKG Interpretation None       Nat Christen, MD 03/06/14 [phone]

## 2014-03-22 ENCOUNTER — Ambulatory Visit (HOSPITAL_COMMUNITY)
Admission: RE | Admit: 2014-03-22 | Discharge: 2014-03-22 | Disposition: A | Discharge status: Home / Self Care | Payer: Medicare Other | Source: Ambulatory Visit | Attending: Orthopedic Surgery | Admitting: Orthopedic Surgery

## 2014-03-22 DIAGNOSIS — M25559 Pain in unspecified hip: Secondary | ICD-10-CM | POA: Insufficient documentation

## 2014-03-22 DIAGNOSIS — M545 Low back pain, unspecified: Secondary | ICD-10-CM | POA: Insufficient documentation

## 2014-03-22 DIAGNOSIS — M25659 Stiffness of unspecified hip, not elsewhere classified: Secondary | ICD-10-CM | POA: Diagnosis not present

## 2014-03-22 DIAGNOSIS — M5442 Lumbago with sciatica, left side: Secondary | ICD-10-CM

## 2014-03-22 DIAGNOSIS — IMO0001 Reserved for inherently not codable concepts without codable children: Secondary | ICD-10-CM | POA: Diagnosis not present

## 2014-03-22 DIAGNOSIS — R262 Difficulty in walking, not elsewhere classified: Secondary | ICD-10-CM | POA: Diagnosis not present

## 2014-03-22 DIAGNOSIS — R269 Unspecified abnormalities of gait and mobility: Secondary | ICD-10-CM | POA: Insufficient documentation

## 2014-03-22 DIAGNOSIS — M6281 Muscle weakness (generalized): Secondary | ICD-10-CM | POA: Diagnosis not present

## 2014-03-22 NOTE — Evaluation (Signed)
Physical Therapy Evaluation  Patient Details  Name: Amy [name] MRN: [id number] Date of Birth: [birth year]  Today's Date: 03/22/2014 Time: 1100-1145 PT Time Calculation (min): 45 min      Charges: 1 Evaluation, and Manual 1135-1145        Visit#: 1 of 16  Re-eval: 04/21/14 Assessment Diagnosis: Lt hip and low back pain Next MD Visit: unsure, Doctor [facility] Prior Therapy: yes "long time ago."  Authorization: Medicare UHC     Past Medical History:  Past Medical History  Diagnosis Date  . Anxiety   . Back pain   . Depression   . Hypercholesterolemia   . Colon polyps   . Diabetes mellitus, type II     diet controlled   Past Surgical History:  Past Surgical History  Procedure Laterality Date  . Appendectomy    . Hernia repair    . Breast surgery    . Bunionectomy    . Facial cosmetic surgery      drooping eyelid and brow  . Temporomandibular joint surgery    . Shoulder surgery      Subjective Symptoms/Limitations Symptoms: No numbness and tingling, noted that she feels the back and hip pain are unrelated to significanly limiting.  Pertinent History: Patient fell out of bed onto floor resultign in back and hip pain. Pt was attempting to climb back into her bed after picking up her dog on 03/04/14 when she slipped off the mattress causing her to fall hitting her right elbow and right rib cage against the nightstand. Patient states she has been in back and hip pain since before Christmas. Patient has had multiple MRI's and injections notign the  injectons sdecreased the pain a little for a short period of time.  Patient notes  that the fall does nto seem to have made the back of hip pain worse, though patient notes nother recent fall when her dog ran across the street and she tripped on  railroad tire. in the grass.  Patient reports multiple falls in the last year especially with climbing up/down stairs at home. Patient feels back and hip pain are unrelated.  How long can you  sit comfortably?: unable to sit without pain.  How long can you stand comfortably?: <6minutes, patient notes difficulty with standing >1 hour Patient Stated Goals: to decrease pain.  Pain Assessment Currently in Pain?: Yes Pain Score: 7  Pain Location: Back Pain Orientation: Left;Posterior Pain Type: Chronic pain Pain Onset: More than a month ago Pain Frequency: Constant Pain Relieving Factors: medication Effect of Pain on Daily Activities: difficulty sitting, standing, walking Multiple Pain Sites: Yes  Cognition/Observation Observation/Other Assessments Observations: Posture: Other Assessments: Gait: excessive toe out, limited frontal plain ovement, limited tibial internal rotation, limited hip extension/stride length.   Sensation/Coordination/Flexibility/Functional Tests Flexibility Thomas: Positive Obers: Positive 90/90: Negative Functional Tests Functional Tests: 3D hip excursion:  limited hip extenison, limited Frontal plane mobil;ity with pain in Lt hip with L side excursion, minor limitation bilateral Functional Tests: +piriformis test, + ely test Functional Tests: Hip alignemnt: Rt hip aterior tilted > Lt , Lt hip elevated   Assessment LLE AROM (degrees) Left Hip External Rotation : 46 Left Hip Internal Rotation : 50 Left Ankle Dorsiflexion: 9 LLE Strength Left Hip Flexion: 4/5 Left Hip Extension: 3+/5 Left Hip ABduction: 3+/5 Left Knee Flexion: 4/5 Left Knee Extension: 5/5 Left Ankle Dorsiflexion: 5/5 Lumbar AROM Lumbar Flexion:  WNL, no pain Lumbar Extension: 30 degrees pain full  Exercise/Treatments Manual Therapy Manual Therapy:  Other (comment) Other Manual Therapy: Muscle energy technique to improve hip alighment (Lt hip posterior tilt, Rt hip anterior tilt) limited lumbar spine mobility throughout, to be further assessend next session.  Physical Therapy Assessment and Plan PT Assessment and Plan Clinical Impression Statement: Patient displayus Low  back and Lt hip pain econday to abnormal gait attributed to gluteal muscle weakness and bilateral LE mobility limitations of quadraceps muscles, ITband, and tensor fascia latae, and wll as abdominal muscle weakness all resulting in abnormal body mechnics includign scoliosis and Hip malalignemnt. Patient will benefti from skilled phsyical therapy to adrdress the above listed limitations and return to prior level of function.  Pt will benefit from skilled therapeutic intervention in order to improve on the following deficits: Abnormal gait;Decreased activity tolerance;Decreased balance;Decreased strength;Difficulty walking;Pain;Increased muscle spasms;Improper body mechanics;Improper spinal/pelvic alignment;Increased fascial restricitons Rehab Potential: Fair Clinical Impairments Affecting Rehab Potential: patient is highly motivated and demosntrated a positive response to initial treatment.  PT Frequency: Min 2X/week PT Duration: 8 weeks PT Treatment/Interventions: Gait training;Stair training;Functional mobility training;Therapeutic activities;Therapeutic exercise;Balance training;Neuromuscular re-education;Modalities;Manual techniques;Patient/family education PT Plan: Initial focus on normalizign  pelvic alignement and improvign posture with secodnay focus on improvign LE mobility and trunk stability. aas mobility/stability increases focus to shift towards improvign LE strength to decrease strain on lumbar spine during lifing and heavy house hold chores.     Goals Home Exercise Program Pt/caregiver will Perform Home Exercise Program: For increased ROM PT Goal: Perform Home Exercise Program - Progress: Goal set today PT Short Term Goals Time to Complete Short Term Goals: 4 weeks PT Short Term Goal 1: Patient will demosntrate a negative piriformis test so patient can sit withAnkle crossed over knee PT Short Term Goal 2: Patient will demonstrate a negative thomase test to indicate improved stride  length PT Short Term Goal 3: Patient will demosntrate a negative obers test indicating improved hip frontal plane mobility for improved ability to decelerate landing during gait.  PT Short Term Goal 4: Patient will be able to extend back 40 degrees to be able to look up more easily when someone yells "heads up."  PT Long Term Goals Time to Complete Long Term Goals: 8 weeks PT Long Term Goal 1: Patient will demosntrate equal hip alignement > 1 week PT Long Term Goal 2: Patient will dmeosntrate increased trunk flexion/extension strength 4/5 MMT to indicte improved postural stability Long Term Goal 3: Patient will demosntrate increased glut max/med strength of 4/5MMT to indicate decreased strain on pirifomris and quadraceps msucles.   Problem List Patient Active Problem List   Diagnosis Date Noted  . Lumbago 03/22/2014  . Stiffness of joint, not elsewhere classified, pelvic region and thigh 03/22/2014  . Muscle weakness (generalized) 03/22/2014  . Abnormality of gait 03/22/2014  . Depression with anxiety 09/03/2012  . Insomnia secondary to depression with anxiety 09/03/2012  . Pain 09/03/2012    PT - End of Session Activity Tolerance: Patient tolerated treatment well General Behavior During Therapy: WFL for tasks assessed/performed PT Plan of Care PT Home Exercise Plan: prone quad stretch and 3D hip excursions  GP Functional Assessment Tool Used: FOTO 56% limited Functional Limitation: Mobility: Walking and moving around Mobility: Walking and Moving Around Current Status ([id number]): At least 40 percent but less than 60 percent impaired, limited or restricted Mobility: Walking and Moving Around Goal Status [phone]): At least 20 percent but less than 40 percent impaired, limited or restricted  Leia Alf 03/22/2014, 7:37 PM  Physician Documentation Your signature is required  to indicate approval of the treatment plan as stated above.  Please sign and either send electronically or make  a copy of this report for your files and return this physician signed original.   Please mark one 1.__approve of plan  2. ___approve of plan with the following conditions.   ______________________________                                                          _____________________ Physician Signature                                                                                                             Date

## 2014-03-24 ENCOUNTER — Ambulatory Visit (HOSPITAL_COMMUNITY): Payer: Self-pay | Admitting: Physical Therapy

## 2014-03-24 ENCOUNTER — Ambulatory Visit (HOSPITAL_COMMUNITY)
Admission: RE | Admit: 2014-03-24 | Discharge: 2014-03-24 | Disposition: A | Discharge status: Home / Self Care | Payer: Medicare Other | Source: Ambulatory Visit | Attending: Orthopedic Surgery | Admitting: Orthopedic Surgery

## 2014-03-24 DIAGNOSIS — IMO0001 Reserved for inherently not codable concepts without codable children: Secondary | ICD-10-CM | POA: Diagnosis not present

## 2014-03-24 NOTE — Progress Notes (Signed)
Physical Therapy Treatment Patient Details  Name: Amy [name] MRN: [id number] Date of Birth: 1936-04-12  Today's Date: 03/24/2014 Time: 6837-2902 PT Time Calculation (min): 46 min Charge maual [id number]; there ex 0223-3612 Visit#: 2 of 16  Re-eval: 04/21/14    Authorization: Medicare UHC   Subjective: Symptoms/Limitations Symptoms: Pt states her Lt leg feels weak.  She states she is having pain in the front of he leg.  Pt states that she is very tired today  Pain Assessment Currently in Pain?: Yes Pain Score: 6  Pain Location: Leg Pain Orientation: Anterior;Left Pain Type: Chronic pain  Exercise/Treatments Ab Set: 10 reps Glut Set: 10 reps Bent Knee Raise: 5 reps Bridge: 5 reps Other Supine Lumbar Exercises: decompression exercises 1-5 Other Supine Lumbar Exercises: isometric ab/adduction x 10    Manual Therapy Other Manual Therapy: mm energy techniques to improve SI alighnment.    Physical Therapy Assessment and Plan PT Assessment and Plan Clinical Impression Statement: Pt demonstarted improper technique for sit to supine to sit.  Instructed in proper technique.  Pt SI alignment corrected using mm energy technique.  Pt began gentle decompressive exercises as well as introduction to stabilization. PT Plan: Begin decompression supine t-band exercises to assist in alignment of spine, continue with both flexibilty and range.  Pt will benefit from ITB stretch     Goals    Problem List Patient Active Problem List   Diagnosis Date Noted  . Lumbago 03/22/2014  . Stiffness of joint, not elsewhere classified, pelvic region and thigh 03/22/2014  . Muscle weakness (generalized) 03/22/2014  . Abnormality of gait 03/22/2014  . Depression with anxiety 09/03/2012  . Insomnia secondary to depression with anxiety 09/03/2012  . Pain 09/03/2012       GP    Cobain Morici,CINDY 03/24/2014, 12:28 PM

## 2014-03-25 ENCOUNTER — Ambulatory Visit (INDEPENDENT_AMBULATORY_CARE_PROVIDER_SITE_OTHER): Payer: 59 | Admitting: Psychology

## 2014-03-25 ENCOUNTER — Encounter (HOSPITAL_COMMUNITY): Payer: Self-pay | Admitting: Psychology

## 2014-03-25 DIAGNOSIS — F5105 Insomnia due to other mental disorder: Secondary | ICD-10-CM

## 2014-03-25 DIAGNOSIS — F341 Dysthymic disorder: Secondary | ICD-10-CM

## 2014-03-25 DIAGNOSIS — F418 Other specified anxiety disorders: Secondary | ICD-10-CM

## 2014-03-25 DIAGNOSIS — F489 Nonpsychotic mental disorder, unspecified: Secondary | ICD-10-CM
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
## 2014-03-31 ENCOUNTER — Inpatient Hospital Stay (HOSPITAL_COMMUNITY): Admission: RE | Admit: 2014-03-31 | Payer: Self-pay | Source: Ambulatory Visit | Admitting: Physical Therapy

## 2014-03-31 ENCOUNTER — Telehealth (HOSPITAL_COMMUNITY): Payer: Self-pay

## 2014-04-05 ENCOUNTER — Ambulatory Visit (HOSPITAL_COMMUNITY)
Admission: RE | Admit: 2014-04-05 | Discharge: 2014-04-05 | Disposition: A | Discharge status: Home / Self Care | Payer: Medicare Other | Source: Ambulatory Visit | Attending: Orthopedic Surgery | Admitting: Orthopedic Surgery

## 2014-04-05 DIAGNOSIS — M25659 Stiffness of unspecified hip, not elsewhere classified: Secondary | ICD-10-CM | POA: Diagnosis not present

## 2014-04-05 DIAGNOSIS — M545 Low back pain, unspecified: Secondary | ICD-10-CM | POA: Insufficient documentation

## 2014-04-05 DIAGNOSIS — R262 Difficulty in walking, not elsewhere classified: Secondary | ICD-10-CM | POA: Insufficient documentation

## 2014-04-05 DIAGNOSIS — M6281 Muscle weakness (generalized): Secondary | ICD-10-CM | POA: Diagnosis not present

## 2014-04-05 DIAGNOSIS — M25559 Pain in unspecified hip: Secondary | ICD-10-CM | POA: Insufficient documentation

## 2014-04-05 DIAGNOSIS — IMO0001 Reserved for inherently not codable concepts without codable children: Secondary | ICD-10-CM | POA: Insufficient documentation

## 2014-04-05 NOTE — Progress Notes (Signed)
Physical Therapy Treatment Patient Details  Name: Amy [name] MRN: [id number] Date of Birth: [birth year]  Today's Date: 04/05/2014 Time: 4268-3419 PT Time Calculation (min): 4 min TE 6222-9798  Visit#: 3 of 16  Re-eval: 04/21/14 Assessment Diagnosis: Lt hip and low back pain   Subjective: Symptoms/Limitations Symptoms: Pt reports some pain in the Lt hip and Lt foot, particularly when transferring from sit -> stand.  Pt reports she has recieved injections into her hip and back, with hip pain diagnosed as bursitis.  Pt reports increased complaints to Lt lateral hip today, with tenderness to palpation to bursa and piriformis insertion.   Pt reports she was recently started on Meloxicam  which has helped control some of her pain. Pain Assessment Currently in Pain?: Yes Pain Score: 3  Pain Location: Hip Pain Orientation: Left  Exercise/Treatments Stretches Passive Hamstring Stretch: 3 reps;30 seconds;Limitations Passive Hamstring Stretch Limitations: On 14" Box Quad Stretch: 3 reps;30 seconds;Limitations Sports administrator Limitations: Prone with rope ITB Stretch: 2 reps;30 seconds Piriformis Stretch: 3 reps;30 seconds;Limitations Piriformis Stretch Limitations: Supine with rope Standing Other Standing Lumbar Exercises: Gastroc Stretch, Slantboard, 30" x3 Supine Ab Set: 2 seconds;10 reps Bridge: 10 reps Straight Leg Raise: 10 reps Other Supine Lumbar Exercises: Decompression exercises #5 x10, with GTB #1-4 x10 Prone  Straight Leg Raise: 10 reps  Physical Therapy Assessment and Plan PT Assessment and Plan Clinical Impression Statement: Pt reports varying complaints of pain today, from Lt hip to Lt foot.  Pt reports hx of bursitis, which she has recieved injecitons for this condition; tenderness with palpation along bursa and piriformis insertion on Lt hip.  Pt was able to tolerate piriformis stretching though increased complaints of pulling on lt > Rt side.  Pt reports onset of lt  foot pain which started today; hx reported of surgery to remove neuroma to Lt foot.  Pt had 1 episode of lt foot pain after standing at end of session.   During exercises, pt reported discomfort in Rt shoulder with resistive exercises with Tband; pt does report hx of Rt RCR which has been causing some discomfort in the morning as pt sleeps on this side.  Pt was able to complete all stretches and exercises today without increases in hip pain.  SIJ alignment checked without notable  alignment issues, and MET completed for hip ADD/ABD to maintain alignement.  Pt will benefit from skilled therapeutic intervention in order to improve on the following deficits: Abnormal gait;Decreased activity tolerance;Decreased balance;Decreased strength;Difficulty walking;Pain;Increased muscle spasms;Improper body mechanics;Improper spinal/pelvic alignment;Increased fascial restricitons Rehab Potential: Fair PT Frequency: Min 2X/week PT Duration: 8 weeks PT Treatment/Interventions: Gait training;Stair training;Functional mobility training;Therapeutic activities;Therapeutic exercise;Balance training;Neuromuscular re-education;Modalities;Manual techniques;Patient/family education PT Plan: Add hip ABD strengthening exercises to decompress hip bursa.      Problem List Patient Active Problem List   Diagnosis Date Noted  . Lumbago 03/22/2014  . Stiffness of joint, not elsewhere classified, pelvic region and thigh 03/22/2014  . Muscle weakness (generalized) 03/22/2014  . Abnormality of gait 03/22/2014  . Depression with anxiety 09/03/2012  . Insomnia secondary to depression with anxiety 09/03/2012  . Pain 09/03/2012    PT - End of Session Activity Tolerance: Patient tolerated treatment well General Behavior During Therapy: [facility] for tasks assessed/performed   Telina Kleckley 04/05/2014, 4:25 PM

## 2014-04-07 ENCOUNTER — Ambulatory Visit (HOSPITAL_COMMUNITY)
Admission: RE | Admit: 2014-04-07 | Discharge: 2014-04-07 | Disposition: A | Discharge status: Home / Self Care | Payer: Medicare Other | Source: Ambulatory Visit | Attending: Orthopedic Surgery | Admitting: Orthopedic Surgery

## 2014-04-07 DIAGNOSIS — IMO0001 Reserved for inherently not codable concepts without codable children: Secondary | ICD-10-CM | POA: Diagnosis not present

## 2014-04-07 NOTE — Progress Notes (Signed)
Physical Therapy Treatment Patient Details  Name: Amy [name] MRN: [id number] Date of Birth: 28-Aug-1935  Today's Date: 04/07/2014 Time: 1345-1430 PT Time Calculation (min): 45 min    Charges: [id number] TherEx, manual [id number],  1422-1430 Gait training Visit#: 4 of 16  Re-eval: 04/21/14 Assessment Diagnosis: Lt hip and low back pain  Authorization: Medicare UHC   Subjective: Symptoms/Limitations Symptoms: Patient arriveds with complaint of Lt foot pain which initially prevents therapy for back and hip. Pati ent states no pain follwoign jont mobilization of fibula. Patient states she continues to have only minor back and knee pain.   Exercise/Treatments Stretches Passive Hamstring Stretch: Limitations Passive Hamstring Stretch Limitations: 10x 3" 3 way On 14" Box ITB Stretch: 2 reps;30 seconds Piriformis Stretch: 3 reps;30 seconds;Limitations Piriformis Stretch Limitations: seated in chair  Standing Functional Squats: Limitations Functional Squats Limitations: squat walk around 10x Other Standing Lumbar Exercises: Gastroc Stretch, Slantboard, 10x 3"  Other Standing Lumbar Exercises: 3D hip excursion 10x 2D walking hip excursions 3ft each  Manual Therapy Manual Therapy: Joint mobilization Joint Mobilization: talus and tibial fibula joint mobilizations.  Myofascial Release: plantar fascia, gastroc, and peroneal tendons  Physical Therapy Assessment and Plan PT Assessment and Plan Clinical Impression Statement: Followign joint mobilization and myofascial release of the plantar fascia and gastroc patient noted pain in foot extinguished. Patient displays improved gait and decreased but continued pain in Lt hip, pointing at ischial tuberosity. Following squat walk around patient noted that there was less pain in hip and that she was able to walk without pain. Patient is making steady progress, but displayed difficulty performing dynamic gait activities requiring thrapist assist to  erform walking hip excursions PT Plan: Add hip ABD strengthening exercises to increase hip stability and balance to improve dynamic gait activities.     Goals Home Exercise Program Pt/caregiver will Perform Home Exercise Program: For increased ROM PT Goal: Perform Home Exercise Program - Progress: Progressing toward goal PT Short Term Goals PT Short Term Goal 1: Patient will demosntrate a negative piriformis test so patient can sit withAnkle crossed over knee PT Short Term Goal 1 - Progress: Progressing toward goal PT Short Term Goal 2: Patient will demonstrate a negative thomase test to indicate improved stride length PT Short Term Goal 2 - Progress: Progressing toward goal PT Short Term Goal 3: Patient will demosntrate a negative obers test indicating improved hip frontal plane mobility for improved ability to decelerate landing during gait.  PT Short Term Goal 3 - Progress: Progressing toward goal PT Short Term Goal 4: Patient will be able to extend back 40 degrees to be able to look up more easily when someone yells "heads up."  PT Short Term Goal 4 - Progress: Progressing toward goal  Problem List Patient Active Problem List   Diagnosis Date Noted  . Lumbago 03/22/2014  . Stiffness of joint, not elsewhere classified, pelvic region and thigh 03/22/2014  . Muscle weakness (generalized) 03/22/2014  . Abnormality of gait 03/22/2014  . Depression with anxiety 09/03/2012  . Insomnia secondary to depression with anxiety 09/03/2012  . Pain 09/03/2012    PT - End of Session Activity Tolerance: Patient tolerated treatment well General Behavior During Therapy: [facility] for tasks assessed/performed  GP    Cahterine Heinzel R 04/07/2014, 2:42 PM

## 2014-04-12 ENCOUNTER — Telehealth (HOSPITAL_COMMUNITY): Payer: Self-pay | Admitting: *Deleted

## 2014-04-12 ENCOUNTER — Ambulatory Visit (HOSPITAL_COMMUNITY): Payer: Medicare Other

## 2014-04-13 NOTE — Telephone Encounter (Signed)
She was given TWO prescriptions for ritalin at that visit, one for August, one for Sept. Ask her what happened to the 2nd one

## 2014-04-14 ENCOUNTER — Ambulatory Visit (HOSPITAL_COMMUNITY): Payer: Self-pay | Admitting: Physical Therapy

## 2014-04-15 ENCOUNTER — Other Ambulatory Visit (HOSPITAL_COMMUNITY): Payer: Self-pay | Admitting: Psychiatry

## 2014-04-15 ENCOUNTER — Ambulatory Visit (HOSPITAL_COMMUNITY)
Admission: RE | Admit: 2014-04-15 | Discharge: 2014-04-15 | Disposition: A | Discharge status: Home / Self Care | Payer: Medicare Other | Source: Ambulatory Visit | Attending: Orthopedic Surgery | Admitting: Orthopedic Surgery

## 2014-04-15 DIAGNOSIS — IMO0001 Reserved for inherently not codable concepts without codable children: Secondary | ICD-10-CM | POA: Diagnosis not present

## 2014-04-15 MED ORDER — METHYLPHENIDATE HCL 10 MG PO TABS: 10.0000 mg | ORAL_TABLET | Freq: Two times a day (BID) | ORAL | Status: DC

## 2014-04-15 NOTE — Progress Notes (Signed)
Physical Therapy Treatment Patient Details  Name: Amy [name] MRN: [id number] Date of Birth: 22-Mar-1936  Today's Date: 04/15/2014 Time: 1101-1146 PT Time Calculation (min): 45 min  92' TE Visit#: 5 of 16  Re-eval: 04/21/14    Authorization: Medicare UHC  Authorization Time Period:    Authorization Visit#:   of     Subjective: Symptoms/Limitations Symptoms: no ankle pain today; L hip 2/10; patient states she is a bit frazeld today, due to MD not writing her second prescrip "so you'll have to be patient with me" Pain Assessment Pain Score: 2  Pain Location: Hip Pain Orientation: Left   Exercise/Treatments    Stretches Passive Hamstring Stretch: Limitations Passive Hamstring Stretch Limitations: 10x 3" 3 way On 14" Box Quad Stretch: 4 reps;20 seconds;Limitations (prone) Quad Stretch Limitations: Prone with rope ITB Stretch: 2 reps;30 seconds Piriformis Stretch: 3 reps;30 seconds;Limitations Piriformis Stretch Limitations: seated in chair  Standing Functional Squats: Limitations Functional Squats Limitations: squat walk around 10x Other Standing Lumbar Exercises: Gastroc Stretch, Slantboard, 10x 3"  Other Standing Lumbar Exercises: 3D hip excursion 10x 2D walking hip excursions 23ft each        Physical Therapy Assessment and Plan PT Assessment and Plan Clinical Impression Statement: Patient was a bit "out of sorts"(anxious) today as she stated she was attending [facility] and getting her meds straighten out. She is very stressed about visiting so many doctors that she has no time to take care of her home or time to just "be". Added standing hip abduction exercises. Poor balance today as well, which patient attributes most to amount of meds she is taking PT Plan: Continue to increase hip stability and balance to improve dynamic gait activities;continue with PT POC    Goals    Problem List Patient Active Problem List   Diagnosis Date Noted  . Lumbago  03/22/2014  . Stiffness of joint, not elsewhere classified, pelvic region and thigh 03/22/2014  . Muscle weakness (generalized) 03/22/2014  . Abnormality of gait 03/22/2014  . Depression with anxiety 09/03/2012  . Insomnia secondary to depression with anxiety 09/03/2012  . Pain 09/03/2012    PT - End of Session Activity Tolerance: Patient tolerated treatment well General Behavior During Therapy: Anxious  GP    Trinh Sanjose, [place] 04/15/2014, 11:58 AM

## 2014-04-15 NOTE — Telephone Encounter (Signed)
Asked pt about what happened to her 2 Rx of Ratlin. Per Pt she did not get a 2nd Rx of Ratalin and need refills. Pt next appt is May 02, 2014. Pt number is [phone] and fills medication at [facility]..../or

## 2014-04-15 NOTE — Telephone Encounter (Signed)
Pt is aware of Dr. Ross decisions and shows understanding 

## 2014-04-15 NOTE — Telephone Encounter (Signed)
She DEFINITELY got 2 scripts as it is in the medical record. However, I will giver her an extra one this time only.She will need to pick it up

## 2014-04-19 ENCOUNTER — Telehealth (HOSPITAL_COMMUNITY): Payer: Self-pay

## 2014-04-19 ENCOUNTER — Ambulatory Visit (HOSPITAL_COMMUNITY): Payer: Medicare Other | Admitting: Physical Therapy

## 2014-04-19 NOTE — Telephone Encounter (Signed)
cx - tooth infection

## 2014-04-20 ENCOUNTER — Telehealth (HOSPITAL_COMMUNITY): Payer: Self-pay | Admitting: *Deleted

## 2014-04-20 NOTE — Telephone Encounter (Signed)
Called pt to remind her that she still have her Ritalin script ready for her to pick up and pt stated she decided she will no longer be taking this medication anymore. Asked pt why shedon't want to and she asked to speak to Dr. [name] and I transferred the call to Dr. [name].

## 2014-04-21 ENCOUNTER — Ambulatory Visit (HOSPITAL_COMMUNITY): Admission: RE | Admit: 2014-04-21 | Payer: Medicare Other | Source: Ambulatory Visit | Admitting: Physical Therapy

## 2014-04-21 ENCOUNTER — Telehealth (HOSPITAL_COMMUNITY): Payer: Self-pay | Admitting: Physical Therapy

## 2014-04-21 NOTE — Telephone Encounter (Signed)
She has an infected tooth and can not come in for PT

## 2014-04-26 ENCOUNTER — Ambulatory Visit (INDEPENDENT_AMBULATORY_CARE_PROVIDER_SITE_OTHER): Payer: 59 | Admitting: Psychology

## 2014-04-26 DIAGNOSIS — F418 Other specified anxiety disorders: Secondary | ICD-10-CM

## 2014-04-26 DIAGNOSIS — R52 Pain, unspecified: Secondary | ICD-10-CM

## 2014-04-26 DIAGNOSIS — F5105 Insomnia due to other mental disorder: Secondary | ICD-10-CM

## 2014-04-26 DIAGNOSIS — F341 Dysthymic disorder: Secondary | ICD-10-CM

## 2014-04-26 DIAGNOSIS — F489 Nonpsychotic mental disorder, unspecified: Secondary | ICD-10-CM

## 2014-05-02 ENCOUNTER — Ambulatory Visit (HOSPITAL_COMMUNITY): Payer: Self-pay | Admitting: Psychiatry

## 2014-05-03 ENCOUNTER — Ambulatory Visit (INDEPENDENT_AMBULATORY_CARE_PROVIDER_SITE_OTHER): Payer: 59 | Admitting: Psychiatry

## 2014-05-03 ENCOUNTER — Encounter (HOSPITAL_COMMUNITY): Payer: Self-pay | Admitting: Psychiatry

## 2014-05-03 VITALS — BP 125/74 | HR 84 | Ht 64.0 in | Wt 189.6 lb

## 2014-05-03 DIAGNOSIS — F411 Generalized anxiety disorder: Secondary | ICD-10-CM

## 2014-05-03 DIAGNOSIS — F332 Major depressive disorder, recurrent severe without psychotic features: Secondary | ICD-10-CM

## 2014-05-03 DIAGNOSIS — F418 Other specified anxiety disorders: Secondary | ICD-10-CM

## 2014-05-03 DIAGNOSIS — F5105 Insomnia due to other mental disorder: Secondary | ICD-10-CM

## 2014-05-03 MED ORDER — GABAPENTIN 100 MG PO CAPS: 100.0000 mg | ORAL_CAPSULE | Freq: Four times a day (QID) | ORAL | Status: DC

## 2014-05-03 MED ORDER — BUPROPION HCL ER (SR) 200 MG PO TB12: 200.0000 mg | ORAL_TABLET | Freq: Two times a day (BID) | ORAL | Status: DC

## 2014-05-03 MED ORDER — ALPRAZOLAM 0.5 MG PO TABS: 0.5000 mg | ORAL_TABLET | Freq: Three times a day (TID) | ORAL | Status: DC | PRN

## 2014-05-03 MED ORDER — ESCITALOPRAM OXALATE 20 MG PO TABS: 20.0000 mg | ORAL_TABLET | Freq: Every day | ORAL | Status: DC
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
## 2014-07-06 ENCOUNTER — Encounter (HOSPITAL_COMMUNITY): Payer: Self-pay | Admitting: Psychology
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
## 2014-10-12 ENCOUNTER — Telehealth (HOSPITAL_COMMUNITY): Payer: Self-pay | Admitting: *Deleted

## 2014-10-12 NOTE — Telephone Encounter (Signed)
Called pt to make f/u appt due to pt not having f/u appt. Pt was last seen 05-03-14 and her pharmacy is requesting her Alprazolam 0.5 mg QD PRN. Per pt, she is not coming back as of right now and will come back when she needs office services. Per pt, Dr. Luan Pulling is filling her medications and to disregard her pharmacy request for her Xanax.

## 2014-12-27 ENCOUNTER — Telehealth (HOSPITAL_COMMUNITY): Payer: Self-pay | Admitting: *Deleted

## 2014-12-27 NOTE — Telephone Encounter (Signed)
Called pt to sch appt due to pharmacy requesting refills for her Bupropion SR 200 mg. Per pt, she had not made any plans about seeing Dr. [name]. Per pt she is feeling better and she is getting her medications from her PCP Dr. Luan Pulling. Called pt pharmacy and spoke with Desira and she stated that refill request was sent to the wrong place.

## 2015-01-16 ENCOUNTER — Telehealth (HOSPITAL_COMMUNITY): Payer: Self-pay | Admitting: *Deleted

## 2015-01-16 NOTE — Telephone Encounter (Signed)
noted 

## 2015-01-16 NOTE — Telephone Encounter (Signed)
Returned phone call to patient.  She stated Dr. Luan Pulling is prescribing her medications.   She said she is confused and don't know what to do.   Said she is doing as well as she want to, but did not want to schedule appointment at this time, since nothing is available for a couple of weeks.    She said she will speak with Dr. Luan Pulling and have his office call us if needed.

## 2015-06-15 ENCOUNTER — Other Ambulatory Visit (HOSPITAL_COMMUNITY): Payer: Self-pay | Admitting: Pulmonary Disease

## 2015-06-15 DIAGNOSIS — Z780 Asymptomatic menopausal state: Secondary | ICD-10-CM

## 2015-06-15 DIAGNOSIS — S72102D Unspecified trochanteric fracture of left femur, subsequent encounter for closed fracture with routine healing: Secondary | ICD-10-CM

## 2015-06-20 ENCOUNTER — Ambulatory Visit (HOSPITAL_COMMUNITY)
Admission: RE | Admit: 2015-06-20 | Discharge: 2015-06-20 | Disposition: A | Discharge status: Home / Self Care | Payer: Medicare Other | Source: Ambulatory Visit | Attending: Pulmonary Disease | Admitting: Pulmonary Disease

## 2015-06-20 DIAGNOSIS — S72102D Unspecified trochanteric fracture of left femur, subsequent encounter for closed fracture with routine healing: Secondary | ICD-10-CM

## 2015-06-20 DIAGNOSIS — X58XXXD Exposure to other specified factors, subsequent encounter: Secondary | ICD-10-CM | POA: Diagnosis not present

## 2015-06-20 DIAGNOSIS — M8580 Other specified disorders of bone density and structure, unspecified site: Secondary | ICD-10-CM | POA: Diagnosis not present

## 2015-06-20 DIAGNOSIS — Z780 Asymptomatic menopausal state: Secondary | ICD-10-CM | POA: Insufficient documentation

## 2015-08-02 DIAGNOSIS — L308 Other specified dermatitis: Secondary | ICD-10-CM | POA: Diagnosis not present

## 2015-08-02 DIAGNOSIS — D485 Neoplasm of uncertain behavior of skin: Secondary | ICD-10-CM | POA: Diagnosis not present

## 2015-08-02 DIAGNOSIS — L270 Generalized skin eruption due to drugs and medicaments taken internally: Secondary | ICD-10-CM | POA: Diagnosis not present

## 2015-09-14 ENCOUNTER — Ambulatory Visit (INDEPENDENT_AMBULATORY_CARE_PROVIDER_SITE_OTHER): Payer: Medicare HMO | Admitting: Psychology

## 2015-09-14 DIAGNOSIS — F5105 Insomnia due to other mental disorder: Secondary | ICD-10-CM

## 2015-09-14 DIAGNOSIS — F341 Dysthymic disorder: Secondary | ICD-10-CM | POA: Diagnosis not present

## 2015-09-14 DIAGNOSIS — R52 Pain, unspecified: Secondary | ICD-10-CM

## 2015-09-14 DIAGNOSIS — F418 Other specified anxiety disorders: Secondary | ICD-10-CM | POA: Diagnosis not present

## 2015-09-18 ENCOUNTER — Ambulatory Visit (HOSPITAL_COMMUNITY): Payer: Self-pay | Admitting: Psychiatry

## 2015-09-19 ENCOUNTER — Ambulatory Visit (INDEPENDENT_AMBULATORY_CARE_PROVIDER_SITE_OTHER): Payer: Medicare HMO | Admitting: Psychiatry

## 2015-09-19 ENCOUNTER — Encounter (HOSPITAL_COMMUNITY): Payer: Self-pay | Admitting: Psychiatry

## 2015-09-19 VITALS — BP 167/91 | HR 75 | Ht 64.0 in | Wt 185.2 lb

## 2015-09-19 DIAGNOSIS — F418 Other specified anxiety disorders: Secondary | ICD-10-CM

## 2015-09-19 DIAGNOSIS — F411 Generalized anxiety disorder: Secondary | ICD-10-CM

## 2015-09-19 DIAGNOSIS — F332 Major depressive disorder, recurrent severe without psychotic features: Secondary | ICD-10-CM | POA: Diagnosis not present

## 2015-09-19 MED ORDER — BUPROPION HCL ER (SR) 200 MG PO TB12: 200.0000 mg | ORAL_TABLET | Freq: Two times a day (BID) | ORAL | Status: DC

## 2015-09-19 MED ORDER — ESCITALOPRAM OXALATE 20 MG PO TABS: 20.0000 mg | ORAL_TABLET | Freq: Every day | ORAL | Status: DC

## 2015-09-19 MED ORDER — DONEPEZIL HCL 5 MG PO TABS: 5.0000 mg | ORAL_TABLET | Freq: Every day | ORAL | Status: DC
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
## 2015-09-21 ENCOUNTER — Ambulatory Visit (HOSPITAL_COMMUNITY): Payer: Self-pay | Admitting: Psychiatry

## 2015-09-26 ENCOUNTER — Ambulatory Visit (INDEPENDENT_AMBULATORY_CARE_PROVIDER_SITE_OTHER): Payer: Medicare HMO | Admitting: Psychology

## 2015-09-26 DIAGNOSIS — F341 Dysthymic disorder: Secondary | ICD-10-CM

## 2015-09-26 DIAGNOSIS — F5105 Insomnia due to other mental disorder: Secondary | ICD-10-CM

## 2015-09-26 DIAGNOSIS — F418 Other specified anxiety disorders: Secondary | ICD-10-CM | POA: Diagnosis not present

## 2015-10-04 ENCOUNTER — Telehealth (HOSPITAL_COMMUNITY): Payer: Self-pay | Admitting: *Deleted

## 2015-10-04 NOTE — Telephone Encounter (Signed)
Pt called stating she don't think her Aricept is working. Per pt, yesterday her stomach was hurting really bad. Per pt she was having really bad aches and hurt the entire day and it hurt really bad like cramps like in her LLQ. Per pt she had diarrhea as well. Per pt, she decided to not take her Aricept last night and woke up this morning around 8:30am and she feels much better. Per pt, she do not want to take the medication anymore. Asked pt if she would like Dr. [name] to do anything for her and she stated it's up to the provider but she just do not want to take it. Per pt, she just wanted to let the provider know. Pt number is [phone].

## 2015-10-04 NOTE — Telephone Encounter (Signed)
Informed pt with what Dr. [name] stated and she showed understanding.

## 2015-10-04 NOTE — Telephone Encounter (Signed)
Tell her i agree that she should stop it

## 2015-10-17 ENCOUNTER — Ambulatory Visit (HOSPITAL_COMMUNITY): Payer: Self-pay | Admitting: Psychiatry

## 2015-10-23 ENCOUNTER — Encounter (HOSPITAL_COMMUNITY): Payer: Self-pay | Admitting: Psychology

## 2015-10-23 ENCOUNTER — Ambulatory Visit (HOSPITAL_COMMUNITY): Payer: Self-pay | Admitting: Psychology

## 2015-10-31 ENCOUNTER — Ambulatory Visit (INDEPENDENT_AMBULATORY_CARE_PROVIDER_SITE_OTHER): Payer: Medicare HMO | Admitting: Psychology

## 2015-10-31 DIAGNOSIS — F5105 Insomnia due to other mental disorder: Secondary | ICD-10-CM | POA: Diagnosis not present

## 2015-10-31 DIAGNOSIS — F341 Dysthymic disorder: Secondary | ICD-10-CM | POA: Diagnosis not present

## 2015-10-31 DIAGNOSIS — F418 Other specified anxiety disorders: Secondary | ICD-10-CM

## 2015-11-02 ENCOUNTER — Ambulatory Visit (INDEPENDENT_AMBULATORY_CARE_PROVIDER_SITE_OTHER): Payer: Medicare HMO | Admitting: Psychiatry

## 2015-11-02 ENCOUNTER — Encounter (HOSPITAL_COMMUNITY): Payer: Self-pay | Admitting: Psychiatry

## 2015-11-02 VITALS — BP 168/95 | HR 81 | Ht 64.0 in | Wt 182.0 lb

## 2015-11-02 DIAGNOSIS — F411 Generalized anxiety disorder: Secondary | ICD-10-CM | POA: Diagnosis not present

## 2015-11-02 DIAGNOSIS — F329 Major depressive disorder, single episode, unspecified: Secondary | ICD-10-CM | POA: Diagnosis not present

## 2015-11-02 DIAGNOSIS — F418 Other specified anxiety disorders: Secondary | ICD-10-CM

## 2015-11-02 MED ORDER — VENLAFAXINE HCL ER 75 MG PO CP24: ORAL_CAPSULE | ORAL | Status: DC

## 2015-11-02 MED ORDER — ESCITALOPRAM OXALATE 20 MG PO TABS: 20.0000 mg | ORAL_TABLET | Freq: Every day | ORAL | Status: DC

## 2015-11-02 MED ORDER — DULOXETINE HCL 60 MG PO CPEP: 60.0000 mg | ORAL_CAPSULE | Freq: Every day | ORAL | Status: DC

## 2015-11-02 NOTE — Patient Instructions (Signed)
Start Cymbalta 60 mg daily Cut down wellbutrin to one pill a day for one week, then stop
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
## 2015-11-06 ENCOUNTER — Telehealth (HOSPITAL_COMMUNITY): Payer: Self-pay | Admitting: *Deleted

## 2015-11-06 NOTE — Telephone Encounter (Signed)
noted 

## 2015-11-06 NOTE — Telephone Encounter (Signed)
Prior authorization for venlafaxine ER received. Called [phone] spoke with Meda Coffee who gave approval until 07/28/16 #[id number]. Called to notify pharmacy.

## 2015-11-08 ENCOUNTER — Telehealth (HOSPITAL_COMMUNITY): Payer: Self-pay | Admitting: *Deleted

## 2015-11-08 NOTE — Telephone Encounter (Signed)
venlafaxine was not at pharmacy the other day, but it came in today and she took what she had been taking.   She has not taken the Venlafaxine yet.  she wants instructions now that the medicine is in.

## 2015-11-08 NOTE — Telephone Encounter (Signed)
Route to Rosalia to tell her. She she cut Wellbutrin down to one pill a day for one week, then stop. At the same time start venlafaxine one pill a day for one week, then increase to two pills a day

## 2015-11-08 NOTE — Telephone Encounter (Signed)
Informed pt of how to take medication and she showed understanding.

## 2015-11-09 DIAGNOSIS — M545 Low back pain: Secondary | ICD-10-CM | POA: Diagnosis not present

## 2015-11-09 DIAGNOSIS — E119 Type 2 diabetes mellitus without complications: Secondary | ICD-10-CM | POA: Diagnosis not present

## 2015-11-09 DIAGNOSIS — R69 Illness, unspecified: Secondary | ICD-10-CM | POA: Diagnosis not present

## 2015-11-21 ENCOUNTER — Ambulatory Visit (INDEPENDENT_AMBULATORY_CARE_PROVIDER_SITE_OTHER): Payer: Medicare HMO | Admitting: Psychology

## 2015-11-21 ENCOUNTER — Encounter (HOSPITAL_COMMUNITY): Payer: Self-pay | Admitting: Psychology

## 2015-11-21 DIAGNOSIS — F341 Dysthymic disorder: Secondary | ICD-10-CM

## 2015-11-21 DIAGNOSIS — F5105 Insomnia due to other mental disorder: Secondary | ICD-10-CM | POA: Diagnosis not present

## 2015-11-21 DIAGNOSIS — F418 Other specified anxiety disorders: Secondary | ICD-10-CM
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
## 2015-11-30 ENCOUNTER — Ambulatory Visit (INDEPENDENT_AMBULATORY_CARE_PROVIDER_SITE_OTHER): Payer: Medicare HMO | Admitting: Psychiatry

## 2015-11-30 ENCOUNTER — Telehealth (HOSPITAL_COMMUNITY): Payer: Self-pay | Admitting: *Deleted

## 2015-11-30 ENCOUNTER — Encounter (HOSPITAL_COMMUNITY): Payer: Self-pay | Admitting: Psychiatry

## 2015-11-30 VITALS — BP 174/96 | HR 84 | Ht 64.0 in | Wt 181.6 lb

## 2015-11-30 DIAGNOSIS — F418 Other specified anxiety disorders: Secondary | ICD-10-CM

## 2015-11-30 DIAGNOSIS — F332 Major depressive disorder, recurrent severe without psychotic features: Secondary | ICD-10-CM

## 2015-11-30 DIAGNOSIS — F411 Generalized anxiety disorder: Secondary | ICD-10-CM | POA: Diagnosis not present

## 2015-11-30 MED ORDER — VENLAFAXINE HCL ER 75 MG PO CP24: 75.0000 mg | ORAL_CAPSULE | Freq: Two times a day (BID) | ORAL | Status: DC

## 2015-11-30 MED ORDER — ESCITALOPRAM OXALATE 20 MG PO TABS: 20.0000 mg | ORAL_TABLET | Freq: Every day | ORAL | Status: DC

## 2015-11-30 MED ORDER — ALPRAZOLAM 0.5 MG PO TABS: 0.5000 mg | ORAL_TABLET | Freq: Three times a day (TID) | ORAL | Status: DC | PRN

## 2015-11-30 NOTE — Telephone Encounter (Signed)
Per Dr. [name] to call Dr. Luan Pulling office to get status on pt Ref for the Neurology office. Called Dr. Luan Pulling office at [phone] and lm on [place] voicemail to call office. Number was provided
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
## 2015-12-12 DIAGNOSIS — E119 Type 2 diabetes mellitus without complications: Secondary | ICD-10-CM | POA: Diagnosis not present

## 2015-12-12 DIAGNOSIS — R69 Illness, unspecified: Secondary | ICD-10-CM | POA: Diagnosis not present

## 2015-12-12 DIAGNOSIS — M545 Low back pain: Secondary | ICD-10-CM | POA: Diagnosis not present

## 2015-12-20 ENCOUNTER — Ambulatory Visit (INDEPENDENT_AMBULATORY_CARE_PROVIDER_SITE_OTHER): Payer: Medicare HMO | Admitting: Psychology

## 2015-12-20 DIAGNOSIS — F418 Other specified anxiety disorders: Secondary | ICD-10-CM | POA: Diagnosis not present

## 2015-12-20 DIAGNOSIS — F5105 Insomnia due to other mental disorder: Secondary | ICD-10-CM

## 2015-12-20 DIAGNOSIS — F341 Dysthymic disorder: Secondary | ICD-10-CM | POA: Diagnosis not present

## 2015-12-21 ENCOUNTER — Encounter (HOSPITAL_COMMUNITY): Payer: Self-pay | Admitting: Psychology
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
## 2016-01-01 ENCOUNTER — Telehealth (HOSPITAL_COMMUNITY): Payer: Self-pay | Admitting: *Deleted

## 2016-01-01 NOTE — Telephone Encounter (Signed)
It has 2 refills, please inform her

## 2016-01-01 NOTE — Telephone Encounter (Signed)
Per Radene Journey, informed pt to call pharmacy to get refills and pt showed understanding.

## 2016-01-01 NOTE — Telephone Encounter (Signed)
Per Radene Journey, she informed pt to call her pharmacy to get her refill medication and pt showed understanding.

## 2016-01-01 NOTE — Telephone Encounter (Signed)
patient returned phone call.

## 2016-01-01 NOTE — Telephone Encounter (Signed)
phone call from patient, calling for status of her earlier request for venlafaxine XR.

## 2016-01-01 NOTE — Telephone Encounter (Signed)
phone call from patient, said she need a refill on venlafaxine XR.   She believe it is working pretty good.   She is not having any problems.

## 2016-01-01 NOTE — Telephone Encounter (Signed)
Please tell her the prescription has refills, she can pick it up at the pharmacy

## 2016-01-03 ENCOUNTER — Encounter (HOSPITAL_COMMUNITY): Payer: Self-pay | Admitting: Psychology
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
## 2016-01-11 ENCOUNTER — Encounter (HOSPITAL_COMMUNITY): Payer: Self-pay | Admitting: Psychiatry

## 2016-01-11 ENCOUNTER — Ambulatory Visit (INDEPENDENT_AMBULATORY_CARE_PROVIDER_SITE_OTHER): Payer: Medicare HMO | Admitting: Psychiatry

## 2016-01-11 VITALS — BP 140/92 | HR 88 | Ht 64.0 in | Wt 178.6 lb

## 2016-01-11 DIAGNOSIS — F411 Generalized anxiety disorder: Secondary | ICD-10-CM | POA: Diagnosis not present

## 2016-01-11 DIAGNOSIS — F332 Major depressive disorder, recurrent severe without psychotic features: Secondary | ICD-10-CM

## 2016-01-11 DIAGNOSIS — F418 Other specified anxiety disorders: Secondary | ICD-10-CM

## 2016-01-11 MED ORDER — VENLAFAXINE HCL ER 75 MG PO CP24: 75.0000 mg | ORAL_CAPSULE | Freq: Two times a day (BID) | ORAL | Status: DC

## 2016-01-11 MED ORDER — ESCITALOPRAM OXALATE 20 MG PO TABS: 20.0000 mg | ORAL_TABLET | Freq: Every day | ORAL | Status: DC

## 2016-01-11 MED ORDER — ALPRAZOLAM 0.5 MG PO TABS: 0.5000 mg | ORAL_TABLET | Freq: Three times a day (TID) | ORAL | Status: DC | PRN
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
## 2016-01-17 ENCOUNTER — Ambulatory Visit (INDEPENDENT_AMBULATORY_CARE_PROVIDER_SITE_OTHER): Payer: Medicare HMO | Admitting: Psychology

## 2016-01-17 ENCOUNTER — Encounter (HOSPITAL_COMMUNITY): Payer: Self-pay | Admitting: Psychology

## 2016-01-17 DIAGNOSIS — F418 Other specified anxiety disorders: Secondary | ICD-10-CM | POA: Diagnosis not present

## 2016-01-17 DIAGNOSIS — F341 Dysthymic disorder: Secondary | ICD-10-CM | POA: Diagnosis not present

## 2016-01-17 DIAGNOSIS — F5105 Insomnia due to other mental disorder: Secondary | ICD-10-CM
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
## 2016-02-08 DIAGNOSIS — E119 Type 2 diabetes mellitus without complications: Secondary | ICD-10-CM | POA: Diagnosis not present

## 2016-02-08 DIAGNOSIS — R69 Illness, unspecified: Secondary | ICD-10-CM | POA: Diagnosis not present

## 2016-02-08 DIAGNOSIS — R441 Visual hallucinations: Secondary | ICD-10-CM | POA: Diagnosis not present

## 2016-02-08 DIAGNOSIS — I10 Essential (primary) hypertension: Secondary | ICD-10-CM | POA: Diagnosis not present

## 2016-02-08 DIAGNOSIS — R440 Auditory hallucinations: Secondary | ICD-10-CM | POA: Diagnosis not present

## 2016-02-13 ENCOUNTER — Other Ambulatory Visit (HOSPITAL_COMMUNITY): Payer: Self-pay | Admitting: Pulmonary Disease

## 2016-02-13 DIAGNOSIS — R440 Auditory hallucinations: Secondary | ICD-10-CM

## 2016-02-13 DIAGNOSIS — R441 Visual hallucinations: Secondary | ICD-10-CM
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
## 2016-02-15 ENCOUNTER — Encounter (HOSPITAL_COMMUNITY): Payer: Self-pay | Admitting: Psychiatry

## 2016-02-15 ENCOUNTER — Ambulatory Visit (INDEPENDENT_AMBULATORY_CARE_PROVIDER_SITE_OTHER): Payer: Medicare HMO | Admitting: Psychiatry

## 2016-02-15 VITALS — BP 153/62 | HR 93 | Ht 64.0 in | Wt 180.2 lb

## 2016-02-15 DIAGNOSIS — F418 Other specified anxiety disorders: Secondary | ICD-10-CM

## 2016-02-15 MED ORDER — RISPERIDONE 0.5 MG PO TABS: 0.5000 mg | ORAL_TABLET | Freq: Every day | ORAL | Status: DC
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
## 2016-02-21 ENCOUNTER — Telehealth (HOSPITAL_COMMUNITY): Payer: Self-pay | Admitting: *Deleted

## 2016-02-21 NOTE — Telephone Encounter (Signed)
Tell pt she will need to try to get it because going off effexor abruptly can cause withdrawal. Why is she running out early?

## 2016-02-21 NOTE — Telephone Encounter (Signed)
patient is here.  she went to pharmacy to pick up venlafaxine XR. they said 13 days too early.  she said there was a period of time between the time prescribed and the time it was filled.   She said she need her medication.  They said she can pay for the 13 pills, but said she don't want to do that since it was not her fault.

## 2016-02-21 NOTE — Telephone Encounter (Signed)
Called pt pharmacy and spoke with Estill Bamberg. Per Estill Bamberg, pt is early for refills and her insurance will not pay for the next 5 days. Per Estill Bamberg, the next date that insurance will pay will be Feb 27, 2016. Asked Estill Bamberg how much it would be if pt was to pick up her medication for Effexor XR early out of pocket and if provider did approve for early refill? Per Estill Bamberg, pt will have to pay $45.88 for 5 days worth. Asked Estill Bamberg if they offer bubble packs or pre packaging services and she stated they do not do that.

## 2016-02-21 NOTE — Telephone Encounter (Signed)
Please ask Amy [name] to call pharmacy

## 2016-02-21 NOTE — Telephone Encounter (Signed)
Called pt pharmacy and spoke with Estill Bamberg. Per Estill Bamberg, pt is early for refills and her insurance will not pay for the next 5 days. Per Estill Bamberg, the next date that insurance will pay will be Feb 27, 2016. Asked Estill Bamberg how much it would be if pt was to pick up her medication for Effexor XR early out of pocket and if provider did approve for early refill? Per Estill Bamberg, pt will have to pay $45.88 for 5 days worth. Asked Estill Bamberg if they offer bubble packs or pre packaging services and she stated they do not do that. Message sent to provider

## 2016-02-22 NOTE — Telephone Encounter (Signed)
Spoke with pt 02-21-16. Informed pt with what Dr. [name] stated to not stop her Effexor. Informed pt that she may have to pay out of pocket for those missing scripts. Per pt, money is not the issue, she just want to know what happened to those capsules. Informed pt unfortunately office do not know but office will call the pharmacy and inform them that Dr. [name] do not want you to stop this medication at any time. Per pt, she do not mind paying the amount but she just want Dr. [name] to be aware. Called pt pharmacy and spoke with Estill Bamberg and informed her with what provider stated and that patient will be coming by office to pick up medication and pt is aware she have to pay out of pocket. Per Estill Bamberg, they will go ahead and get the 5 days worth of medications ready for pt until her insurance can pay for the whole 30 days worth. Informed pt with this information and pt verbalized understanding and stated she will go to pharmacy to get medication.

## 2016-02-26 ENCOUNTER — Ambulatory Visit (INDEPENDENT_AMBULATORY_CARE_PROVIDER_SITE_OTHER): Payer: Medicare HMO | Admitting: Psychology

## 2016-02-26 DIAGNOSIS — R413 Other amnesia: Secondary | ICD-10-CM

## 2016-02-26 DIAGNOSIS — F5105 Insomnia due to other mental disorder: Secondary | ICD-10-CM

## 2016-02-26 DIAGNOSIS — F418 Other specified anxiety disorders: Secondary | ICD-10-CM | POA: Diagnosis not present

## 2016-02-26 DIAGNOSIS — F341 Dysthymic disorder: Secondary | ICD-10-CM

## 2016-02-27 ENCOUNTER — Telehealth (HOSPITAL_COMMUNITY): Payer: Self-pay | Admitting: *Deleted

## 2016-02-27 ENCOUNTER — Ambulatory Visit (HOSPITAL_COMMUNITY): Payer: Medicare HMO

## 2016-02-27 DIAGNOSIS — E119 Type 2 diabetes mellitus without complications: Secondary | ICD-10-CM | POA: Diagnosis not present

## 2016-02-27 DIAGNOSIS — R109 Unspecified abdominal pain: Secondary | ICD-10-CM | POA: Diagnosis not present

## 2016-02-27 DIAGNOSIS — R634 Abnormal weight loss: Secondary | ICD-10-CM | POA: Diagnosis not present

## 2016-02-27 NOTE — Telephone Encounter (Signed)
Amy [name] with Dr. Sinda Du office called in reference to pt. Dr. Luan Pulling would like to talk with Dr. Sima Matas about pt. [phone].

## 2016-02-29 DIAGNOSIS — E119 Type 2 diabetes mellitus without complications: Secondary | ICD-10-CM | POA: Diagnosis not present

## 2016-02-29 DIAGNOSIS — R634 Abnormal weight loss: Secondary | ICD-10-CM | POA: Diagnosis not present

## 2016-02-29 DIAGNOSIS — R109 Unspecified abdominal pain: Secondary | ICD-10-CM | POA: Diagnosis not present

## 2016-03-05 ENCOUNTER — Telehealth (HOSPITAL_COMMUNITY): Payer: Self-pay | Admitting: *Deleted

## 2016-03-05 NOTE — Telephone Encounter (Signed)
Opened in Error.

## 2016-03-12 ENCOUNTER — Ambulatory Visit (INDEPENDENT_AMBULATORY_CARE_PROVIDER_SITE_OTHER): Payer: Medicare HMO | Admitting: Psychiatry

## 2016-03-12 ENCOUNTER — Encounter (HOSPITAL_COMMUNITY): Payer: Self-pay | Admitting: Psychiatry

## 2016-03-12 VITALS — BP 137/84 | HR 84 | Ht 64.0 in | Wt 181.2 lb

## 2016-03-12 DIAGNOSIS — F418 Other specified anxiety disorders: Secondary | ICD-10-CM | POA: Diagnosis not present

## 2016-03-12 MED ORDER — ALPRAZOLAM 0.5 MG PO TABS: 0.5000 mg | ORAL_TABLET | Freq: Three times a day (TID) | ORAL | 2 refills | Status: DC | PRN

## 2016-03-12 MED ORDER — ESCITALOPRAM OXALATE 20 MG PO TABS: 20.0000 mg | ORAL_TABLET | Freq: Every day | ORAL | 2 refills | Status: DC

## 2016-03-12 MED ORDER — VENLAFAXINE HCL ER 75 MG PO CP24: 75.0000 mg | ORAL_CAPSULE | Freq: Two times a day (BID) | ORAL | 2 refills | Status: DC

## 2016-03-12 MED ORDER — RISPERIDONE 0.5 MG PO TABS: 0.5000 mg | ORAL_TABLET | Freq: Every day | ORAL | 2 refills | Status: DC
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
## 2016-03-21 DIAGNOSIS — L299 Pruritus, unspecified: Secondary | ICD-10-CM | POA: Diagnosis not present

## 2016-03-21 DIAGNOSIS — D2262 Melanocytic nevi of left upper limb, including shoulder: Secondary | ICD-10-CM | POA: Diagnosis not present

## 2016-03-21 DIAGNOSIS — D0439 Carcinoma in situ of skin of other parts of face: Secondary | ICD-10-CM | POA: Diagnosis not present

## 2016-03-21 DIAGNOSIS — D485 Neoplasm of uncertain behavior of skin: Secondary | ICD-10-CM | POA: Diagnosis not present

## 2016-03-21 DIAGNOSIS — L0100 Impetigo, unspecified: Secondary | ICD-10-CM | POA: Diagnosis not present

## 2016-03-21 DIAGNOSIS — L570 Actinic keratosis: Secondary | ICD-10-CM | POA: Diagnosis not present

## 2016-03-25 ENCOUNTER — Ambulatory Visit (INDEPENDENT_AMBULATORY_CARE_PROVIDER_SITE_OTHER): Payer: Medicare HMO | Admitting: Psychology

## 2016-03-25 DIAGNOSIS — R413 Other amnesia: Secondary | ICD-10-CM

## 2016-03-25 DIAGNOSIS — F418 Other specified anxiety disorders: Secondary | ICD-10-CM | POA: Diagnosis not present

## 2016-03-26 ENCOUNTER — Encounter (HOSPITAL_COMMUNITY): Payer: Self-pay | Admitting: Psychology
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
## 2016-03-28 DIAGNOSIS — C44321 Squamous cell carcinoma of skin of nose: Secondary | ICD-10-CM | POA: Diagnosis not present

## 2016-04-04 DIAGNOSIS — L570 Actinic keratosis: Secondary | ICD-10-CM | POA: Diagnosis not present

## 2016-04-12 DIAGNOSIS — M25551 Pain in right hip: Secondary | ICD-10-CM | POA: Diagnosis not present

## 2016-04-12 DIAGNOSIS — M25561 Pain in right knee: Secondary | ICD-10-CM | POA: Diagnosis not present

## 2016-04-12 DIAGNOSIS — M25562 Pain in left knee: Secondary | ICD-10-CM | POA: Diagnosis not present

## 2016-04-18 ENCOUNTER — Ambulatory Visit (INDEPENDENT_AMBULATORY_CARE_PROVIDER_SITE_OTHER): Payer: Medicare HMO | Admitting: Psychology

## 2016-05-03 ENCOUNTER — Other Ambulatory Visit (HOSPITAL_COMMUNITY): Payer: Self-pay | Admitting: Orthopedic Surgery

## 2016-05-03 DIAGNOSIS — M25561 Pain in right knee: Secondary | ICD-10-CM

## 2016-05-08 DIAGNOSIS — L280 Lichen simplex chronicus: Secondary | ICD-10-CM | POA: Diagnosis not present

## 2016-05-09 ENCOUNTER — Ambulatory Visit (HOSPITAL_COMMUNITY)
Admission: RE | Admit: 2016-05-09 | Discharge: 2016-05-09 | Disposition: A | Discharge status: Home / Self Care | Payer: Medicare HMO | Source: Ambulatory Visit | Attending: Orthopedic Surgery | Admitting: Orthopedic Surgery

## 2016-05-09 ENCOUNTER — Ambulatory Visit (HOSPITAL_COMMUNITY): Payer: Self-pay | Admitting: Psychiatry

## 2016-05-09 DIAGNOSIS — M7121 Synovial cyst of popliteal space [Baker], right knee: Secondary | ICD-10-CM | POA: Insufficient documentation

## 2016-05-09 DIAGNOSIS — X58XXXA Exposure to other specified factors, initial encounter: Secondary | ICD-10-CM | POA: Insufficient documentation

## 2016-05-09 DIAGNOSIS — S83241A Other tear of medial meniscus, current injury, right knee, initial encounter: Secondary | ICD-10-CM | POA: Insufficient documentation

## 2016-05-09 DIAGNOSIS — M25461 Effusion, right knee: Secondary | ICD-10-CM | POA: Insufficient documentation

## 2016-05-09 DIAGNOSIS — S83281A Other tear of lateral meniscus, current injury, right knee, initial encounter: Secondary | ICD-10-CM | POA: Insufficient documentation

## 2016-05-09 DIAGNOSIS — M25561 Pain in right knee: Secondary | ICD-10-CM | POA: Insufficient documentation

## 2016-05-10 DIAGNOSIS — M25561 Pain in right knee: Secondary | ICD-10-CM | POA: Diagnosis not present

## 2016-05-13 ENCOUNTER — Ambulatory Visit (HOSPITAL_COMMUNITY): Payer: Self-pay | Admitting: Psychology

## 2016-05-14 ENCOUNTER — Ambulatory Visit (INDEPENDENT_AMBULATORY_CARE_PROVIDER_SITE_OTHER): Payer: Medicare HMO | Admitting: Psychiatry

## 2016-05-14 ENCOUNTER — Encounter (HOSPITAL_COMMUNITY): Payer: Self-pay | Admitting: Psychiatry

## 2016-05-14 VITALS — BP 140/77 | HR 84 | Ht 64.0 in | Wt 175.6 lb

## 2016-05-14 DIAGNOSIS — Z818 Family history of other mental and behavioral disorders: Secondary | ICD-10-CM

## 2016-05-14 DIAGNOSIS — F418 Other specified anxiety disorders: Secondary | ICD-10-CM

## 2016-05-14 DIAGNOSIS — Z809 Family history of malignant neoplasm, unspecified: Secondary | ICD-10-CM

## 2016-05-14 DIAGNOSIS — Z833 Family history of diabetes mellitus: Secondary | ICD-10-CM

## 2016-05-14 DIAGNOSIS — R413 Other amnesia: Secondary | ICD-10-CM | POA: Diagnosis not present

## 2016-05-14 DIAGNOSIS — Z79899 Other long term (current) drug therapy: Secondary | ICD-10-CM

## 2016-05-14 DIAGNOSIS — Z811 Family history of alcohol abuse and dependence: Secondary | ICD-10-CM | POA: Diagnosis not present

## 2016-05-14 MED ORDER — RISPERIDONE 0.5 MG PO TABS: 0.5000 mg | ORAL_TABLET | Freq: Every day | ORAL | 2 refills | Status: DC

## 2016-05-14 MED ORDER — ALPRAZOLAM 0.5 MG PO TABS: 0.5000 mg | ORAL_TABLET | Freq: Three times a day (TID) | ORAL | 2 refills | Status: DC | PRN

## 2016-05-14 MED ORDER — VENLAFAXINE HCL ER 75 MG PO CP24: 75.0000 mg | ORAL_CAPSULE | Freq: Two times a day (BID) | ORAL | 2 refills | Status: DC

## 2016-05-14 MED ORDER — ESCITALOPRAM OXALATE 20 MG PO TABS: 20.0000 mg | ORAL_TABLET | Freq: Every day | ORAL | 2 refills | Status: DC
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
## 2016-05-17 ENCOUNTER — Ambulatory Visit (HOSPITAL_COMMUNITY): Payer: Self-pay | Admitting: Psychology

## 2016-05-21 DIAGNOSIS — Z23 Encounter for immunization: Secondary | ICD-10-CM | POA: Diagnosis not present

## 2016-05-21 DIAGNOSIS — E119 Type 2 diabetes mellitus without complications: Secondary | ICD-10-CM | POA: Diagnosis not present

## 2016-05-21 DIAGNOSIS — I10 Essential (primary) hypertension: Secondary | ICD-10-CM | POA: Diagnosis not present

## 2016-05-21 DIAGNOSIS — M545 Low back pain: Secondary | ICD-10-CM | POA: Diagnosis not present

## 2016-05-21 DIAGNOSIS — R69 Illness, unspecified: Secondary | ICD-10-CM | POA: Diagnosis not present

## 2016-05-22 ENCOUNTER — Telehealth (HOSPITAL_COMMUNITY): Payer: Self-pay | Admitting: Physical Therapy

## 2016-05-22 NOTE — Telephone Encounter (Signed)
Spoke to [place] said it will be ok for her to start PT on Nov 6th. NF 05/22/16  Called Dr. French Ana ask if he wants her to start PT on Friday or Monday after surgery NF 05/15/16

## 2016-05-23 DIAGNOSIS — M545 Low back pain: Secondary | ICD-10-CM | POA: Diagnosis not present

## 2016-05-23 DIAGNOSIS — R69 Illness, unspecified: Secondary | ICD-10-CM | POA: Diagnosis not present

## 2016-05-23 DIAGNOSIS — I10 Essential (primary) hypertension: Secondary | ICD-10-CM | POA: Diagnosis not present

## 2016-05-23 DIAGNOSIS — E119 Type 2 diabetes mellitus without complications: Secondary | ICD-10-CM | POA: Diagnosis not present

## 2016-05-27 ENCOUNTER — Telehealth (HOSPITAL_COMMUNITY): Payer: Self-pay | Admitting: *Deleted

## 2016-05-27 NOTE — Telephone Encounter (Signed)
RETURNED PHONE CALL.

## 2016-05-29 DIAGNOSIS — S83281A Other tear of lateral meniscus, current injury, right knee, initial encounter: Secondary | ICD-10-CM | POA: Diagnosis not present

## 2016-05-29 DIAGNOSIS — M6751 Plica syndrome, right knee: Secondary | ICD-10-CM | POA: Diagnosis not present

## 2016-05-29 DIAGNOSIS — S83241A Other tear of medial meniscus, current injury, right knee, initial encounter: Secondary | ICD-10-CM | POA: Diagnosis not present

## 2016-05-29 DIAGNOSIS — G8918 Other acute postprocedural pain: Secondary | ICD-10-CM | POA: Diagnosis not present

## 2016-05-29 DIAGNOSIS — M94261 Chondromalacia, right knee: Secondary | ICD-10-CM | POA: Diagnosis not present

## 2016-05-31 ENCOUNTER — Ambulatory Visit (HOSPITAL_COMMUNITY): Payer: Medicare HMO | Admitting: Physical Therapy

## 2016-06-03 ENCOUNTER — Ambulatory Visit (HOSPITAL_COMMUNITY): Payer: Medicare HMO | Admitting: Physical Therapy

## 2016-06-04 ENCOUNTER — Ambulatory Visit (HOSPITAL_COMMUNITY): Payer: Medicare HMO | Attending: Orthopedic Surgery | Admitting: Physical Therapy

## 2016-06-04 ENCOUNTER — Encounter (HOSPITAL_COMMUNITY): Payer: Self-pay | Admitting: Physical Therapy

## 2016-06-04 DIAGNOSIS — R2689 Other abnormalities of gait and mobility: Secondary | ICD-10-CM

## 2016-06-04 DIAGNOSIS — R29898 Other symptoms and signs involving the musculoskeletal system: Secondary | ICD-10-CM | POA: Diagnosis not present

## 2016-06-04 DIAGNOSIS — M25561 Pain in right knee: Secondary | ICD-10-CM | POA: Diagnosis not present

## 2016-06-04 NOTE — Therapy (Signed)
[facility] [address] [place], [place], [address] Phone: [phone]   Fax:  [phone]  Physical Therapy Evaluation  Patient Details  Name: Amy [name] MRN: [id number] Date of Birth: [birth year] Referring Provider: Earlie Server, MD  Encounter Date: 06/04/2016      PT End of Session - 06/04/16 1719    Visit Number 1   Number of Visits 6   Date for PT Re-Evaluation 06/25/16   Authorization Type [employer] medicare   Authorization Time Period 06/04/16 to 07/16/16   PT Start Time 1603   PT Stop Time 1645   PT Time Calculation (min) 42 min   Activity Tolerance Patient tolerated treatment well;No increased pain   Behavior During Therapy WFL for tasks assessed/performed      Past Medical History:  Diagnosis Date  . Anxiety   . Back pain   . Colon polyps   . Depression   . Diabetes mellitus, type II ([place])    diet controlled  . Hypercholesterolemia     Past Surgical History:  Procedure Laterality Date  . APPENDECTOMY    . BREAST SURGERY    . BUNIONECTOMY    . FACIAL COSMETIC SURGERY     drooping eyelid and brow  . HERNIA REPAIR    . SHOULDER SURGERY    . TEMPOROMANDIBULAR JOINT SURGERY      There were no vitals filed for this visit.       Subjective Assessment - 06/04/16 1609    Subjective Pt reports having a knee scope on 05/29/16. She tore B meniscus prior to surgery, not sure what caused it. She had been having pain for about 6 weeks. Since the surgery she has been doing well.    Pertinent History anxiety/depression, LBP, DM, depression   Limitations Other (comment)  steps    How long can you sit comfortably? unlimited    How long can you stand comfortably? 5 minutes    How long can you walk comfortably? immediately    Patient Stated Goals improve the pain and make sure she has as much ROM as she should    Currently in Pain? Yes   Pain Score 5    Pain Location Knee   Pain Orientation Right   Pain  Descriptors / Indicators Aching   Pain Type Acute pain   Pain Radiating Towards none, but soreness noted Rt lateral thigh   Pain Onset More than a month ago   Pain Frequency Intermittent   Aggravating Factors  weight bearing, steps   Pain Relieving Factors ice/elevation   Effect of Pain on Daily Activities limited activity tolerance             [facility] PT Assessment - 06/04/16 0001      Assessment   Medical Diagnosis Rt knee scope    Referring Provider Earlie Server, MD   Onset Date/Surgical Date 05/29/16   Next MD Visit 06/07/16   Prior Therapy none      Precautions   Precautions None     Restrictions   Weight Bearing Restrictions No     Balance Screen   Has the patient fallen in the past 6 months No   Has the patient had a decrease in activity level because of a fear of falling?  No   Is the patient reluctant to leave their home because of a fear of falling?  No     Home Ecologist residence  Prior Function   Level of Independence Independent   Leisure walking, helping out with her nephew who recently broke his leg      Cognition   Overall Cognitive Status Within Functional Limits for tasks assessed     Observation/Other Assessments   Observations sitting with RLE extended      Sensation   Light Touch Appears Intact     ROM / Strength   AROM / PROM / Strength AROM;Strength     AROM   AROM Assessment Site Knee   Right/Left Knee Right;Left   Right Knee Extension 0   Right Knee Flexion 120   Left Knee Extension 0   Left Knee Flexion 145     Strength   Strength Assessment Site Hip;Knee;Ankle   Right/Left Hip Right;Left   Right Hip Flexion 4+/5   Right Hip Extension 4+/5   Right Hip ABduction 4/5   Left Hip Flexion 4+/5   Left Hip Extension 4+/5   Left Hip ABduction 4/5   Right/Left Knee Right;Left   Right Knee Flexion 4+/5   Right Knee Extension 5/5   Left Knee Flexion 4+/5   Left Knee Extension 5/5   Right/Left  Ankle Right;Left   Right Ankle Dorsiflexion 5/5   Left Ankle Dorsiflexion 5/5     Palpation   Palpation comment portal sights intact and healing well; tenderness noted along Rt lateral hamstring/ITB     Transfers   Five time sit to stand comments  12.9, no UE      Ambulation/Gait   Ambulation/Gait Yes   Ambulation Distance (Feet) 50 Feet   Assistive device None   Gait Pattern Within Functional Limits   Pre-Gait Activities Ascend/descend steps with step to pattern x2 trials; x3 trials with verbal cues for improved step over step technique     Standardized Balance Assessment   Standardized Balance Assessment Timed Up and Go Test     Timed Up and Go Test   TUG Comments 9 sec, no AD     High Level Balance   High Level Balance Comments SLS no greater than 3-4 sec each                            PT Education - 06/04/16 1715    Education provided Yes   Education Details eval findings/POC; HEP; encouraged ascending/descending steps with reciprocal step over step pattern to improve knee strength    Person(s) Educated Patient   Methods Explanation;Demonstration;Handout   Comprehension Verbalized understanding;Returned demonstration          PT Short Term Goals - 06/04/16 1736      PT SHORT TERM GOAL #1   Title Pt will demo consistency and independence with her HEP to improve BLE strength and Rt knee ROM   Time 2   Period Weeks   Status New     PT SHORT TERM GOAL #2   Title Pt will demo improved Rt knee flexion ROM to atleast 130 deg to improve her overall mobility.    Time 6   Period Weeks   Status New           PT Long Term Goals - 06/04/16 1737      PT LONG TERM GOAL #1   Title Pt will demo improved BLE strength to 5/5 MMT to increase her safety with functional tasks at home.    Time 6   Period Weeks   Status New  PT LONG TERM GOAL #2   Title Pt will demo improved functional strength evident by her ability to perform 5x sit to stand in  less than 10 sec without UE support or report of pain.    Time 6   Period Weeks   Status New     PT LONG TERM GOAL #3   Title Pt will demo improved balance evident by her ability to maintain SLS on each LE up to 10 sec, 2/3 trials without LOB, to decrease risk of falling.    Time 6   Period Weeks   Status New     PT LONG TERM GOAL #4   Title Pt will demo improved functional strength of her RLE evident by her ability to ascend/descend 6" steps with reciprocal pattern and with no more than minimal pain report, x3 trials.    Time 6   Period Weeks   Status New               Plan - July 02, 2016 1722    Clinical Impression Statement Pt is a 80yo F referred to [facility]T s/p Rt medial and lateral meniscectomy on 05/29/16 and reports she has been doing fine ever since. She presents today with pain and mild limitations in knee ROM and LE strength. She performed very well on functional tests such as the TUG and 5x sit to stand and was able to ascend/descend steps with both handrails and step to pattern. I reviewed proper step over step pattern and pt was able to return demonstration after several trials with verbal cues. She would benefit from several sessions of skilled PT to address noted limitations and ensure she is able to return to full activity and home remodeling without any concerns.   Rehab Potential Good   PT Frequency 1x / week   PT Duration 6 weeks   PT Treatment/Interventions ADLs/Self Care Home Management;Cryotherapy;Gait training;Stair training;Functional mobility training;Therapeutic activities;Therapeutic exercise;Patient/family education;Neuromuscular re-education;Balance training;Manual techniques;Dry needling;Passive range of motion;Scar mobilization   PT Next Visit Plan LE functional strengthening, balance activity progression   PT Home Exercise Plan knee flexion stretch, ascend/descend steps with handrails and step over step pattern, SLS at counter    Recommended Other Services  none   Consulted and Agree with Plan of Care Patient      Patient will benefit from skilled therapeutic intervention in order to improve the following deficits and impairments:  Decreased balance, Decreased strength, Pain, Decreased scar mobility, Decreased range of motion, Increased muscle spasms  Visit Diagnosis: Acute pain of right knee  Other abnormalities of gait and mobility  Other symptoms and signs involving the musculoskeletal system      G-Codes - 2016/07/02 1739    Functional Assessment Tool Used clinical judgement based on assessment of ROM, strength and mobility   Functional Limitation Mobility: Walking and moving around   Mobility: Walking and Moving Around Current Status [id number]) At least 20 percent but less than 40 percent impaired, limited or restricted   Mobility: Walking and Moving Around Goal Status [phone]) At least 1 percent but less than 20 percent impaired, limited or restricted       Problem List Patient Active Problem List   Diagnosis Date Noted  . Lumbago 03/22/2014  . Stiffness of joint, not elsewhere classified, pelvic region and thigh 03/22/2014  . Muscle weakness (generalized) 03/22/2014  . Abnormality of gait 03/22/2014  . Depression with anxiety 09/03/2012  . Insomnia secondary to depression with anxiety 09/03/2012  . Pain 09/03/2012  5:47 PM,06/04/16 Elly Modena PT, DPT [facility] [address] [place], [place], [address] Phone: [phone]   Fax:  [phone]  Name: Amy [name] MRN: [id number] Date of Birth: 1936/03/21

## 2016-06-06 DIAGNOSIS — Z1231 Encounter for screening mammogram for malignant neoplasm of breast: Secondary | ICD-10-CM | POA: Diagnosis not present

## 2016-06-06 DIAGNOSIS — R928 Other abnormal and inconclusive findings on diagnostic imaging of breast: Secondary | ICD-10-CM | POA: Diagnosis not present

## 2016-06-12 ENCOUNTER — Ambulatory Visit (HOSPITAL_COMMUNITY): Payer: Medicare HMO

## 2016-06-12 ENCOUNTER — Telehealth (HOSPITAL_COMMUNITY): Payer: Self-pay

## 2016-06-12 DIAGNOSIS — R69 Illness, unspecified: Secondary | ICD-10-CM | POA: Diagnosis not present

## 2016-06-12 NOTE — Telephone Encounter (Signed)
No show, called and left message about missed apt.  Included next apt date and time with contact info given.    Casey Cockerham, LPTA; CBIS [phone]  

## 2016-06-18 ENCOUNTER — Ambulatory Visit (HOSPITAL_COMMUNITY): Payer: Medicare HMO

## 2016-06-18 DIAGNOSIS — M25561 Pain in right knee: Secondary | ICD-10-CM | POA: Diagnosis not present

## 2016-06-18 DIAGNOSIS — R29898 Other symptoms and signs involving the musculoskeletal system: Secondary | ICD-10-CM

## 2016-06-18 DIAGNOSIS — R2689 Other abnormalities of gait and mobility: Secondary | ICD-10-CM

## 2016-06-18 NOTE — Therapy (Signed)
[facility] [address] [place], [place], [address] Phone: [phone]   Fax:  [phone]  Physical Therapy Treatment  Patient Details  Name: Amy [name] MRN: [id number] Date of Birth: Nov 08, 1935 Referring Provider: Earlie Server, MD  Encounter Date: 06/18/2016      PT End of Session - 06/18/16 1310    Visit Number 2   Number of Visits 6   Date for PT Re-Evaluation 06/25/16   Authorization Type [employer] medicare   Authorization Time Period 06/04/16 to 07/16/16   PT Start Time 1302   PT Stop Time 1348   PT Time Calculation (min) 46 min   Activity Tolerance Patient tolerated treatment well;No increased pain   Behavior During Therapy WFL for tasks assessed/performed      Past Medical History:  Diagnosis Date  . Anxiety   . Back pain   . Colon polyps   . Depression   . Diabetes mellitus, type II ([place])    diet controlled  . Hypercholesterolemia     Past Surgical History:  Procedure Laterality Date  . APPENDECTOMY    . BREAST SURGERY    . BUNIONECTOMY    . FACIAL COSMETIC SURGERY     drooping eyelid and brow  . HERNIA REPAIR    . SHOULDER SURGERY    . TEMPOROMANDIBULAR JOINT SURGERY      There were no vitals filed for this visit.      Subjective Assessment - 06/18/16 1259    Subjective Pt stated her knee is doing well, no reports of pain in knee today.  Reports pain with sit to stands and weight bearing activities that gets better as she is on feet.  Pt presents with neurodermaitis open wounds Bil UE and thigh with current pain scale 7/10.   Pertinent History anxiety/depression, LBP, DM, depression   Patient Stated Goals improve the pain and make sure she has as much ROM as she should    Currently in Pain? Yes   Pain Score 7    Pain Location Arm   Pain Orientation Right;Left   Pain Descriptors / Indicators Burning   Pain Type Chronic pain   Pain Onset More than a month ago             [facility] Adult PT  Treatment/Exercise - 06/18/16 0001      Ambulation/Gait   Pre-Gait Activities Ascend/descend 7in step reciprocal pattern     Exercises   Exercises Knee/Hip     Knee/Hip Exercises: Stretches   Knee: Self-Stretch to increase Flexion 10 seconds   Knee: Self-Stretch Limitations knee drives [id number]" on [id number] step     Knee/Hip Exercises: Standing   Heel Raises 15 reps   Heel Raises Limitations Toe raises 10x with minimal HHA   Stairs 3RT ascend/descend 7in reciprocal pattern    Rocker Board 2 minutes   Rocker Board Limitations R/L with intermittent 1 finger A   SLS Rt 3" Lt 4" max of 5   Other Standing Knee Exercises Tandem stance 5x 30"   Other Standing Knee Exercises Tandem gait 2RT     Knee/Hip Exercises: Seated   Sit to Sand 10 reps;without UE support  low mat height                 PT Education - 06/18/16 1707    Education provided Yes   Education Details Reviewed eval findings/goals, complaince with HEP, copy of eval given to pt   Person(s) Educated Patient  Methods Explanation;Handout   Comprehension Verbalized understanding;Returned demonstration          PT Short Term Goals - 06/04/16 1736      PT SHORT TERM GOAL #1   Title Pt will demo consistency and independence with her HEP to improve BLE strength and Rt knee ROM   Time 2   Period Weeks   Status New     PT SHORT TERM GOAL #2   Title Pt will demo improved Rt knee flexion ROM to atleast 130 deg to improve her overall mobility.    Time 6   Period Weeks   Status New           PT Long Term Goals - 06/04/16 1737      PT LONG TERM GOAL #1   Title Pt will demo improved BLE strength to 5/5 MMT to increase her safety with functional tasks at home.    Time 6   Period Weeks   Status New     PT LONG TERM GOAL #2   Title Pt will demo improved functional strength evident by her ability to perform 5x sit to stand in less than 10 sec without UE support or report of pain.    Time 6   Period Weeks    Status New     PT LONG TERM GOAL #3   Title Pt will demo improved balance evident by her ability to maintain SLS on each LE up to 10 sec, 2/3 trials without LOB, to decrease risk of falling.    Time 6   Period Weeks   Status New     PT LONG TERM GOAL #4   Title Pt will demo improved functional strength of her RLE evident by her ability to ascend/descend 6" steps with reciprocal pattern and with no more than minimal pain report, x3 trials.    Time 6   Period Weeks   Status New               Plan - 06/18/16 1320    Clinical Impression Statement Reviewed goals, complaince wiht HEP and copy of eval given to pt.  Pt entered dept with several open wounds on UE, pt advised to keep covered to reduce risk of infection.  Session foucs on improving functional strengthening and balance training.  Pt able to demonstrate improvements wiht functional strengthening noted by abilty to complete reciprocal pattern on 7in step height ascending and descending with minimal cueing for eccentric control, safely.  Increased focus on balance training with min A required and cueing to improve spatial awareness to assist with balance.  EOS pt limitied by fatigue, no reports of pain through session.     Rehab Potential Good   PT Frequency 1x / week   PT Duration 6 weeks   PT Treatment/Interventions ADLs/Self Care Home Management;Cryotherapy;Gait training;Stair training;Functional mobility training;Therapeutic activities;Therapeutic exercise;Patient/family education;Neuromuscular re-education;Balance training;Manual techniques;Dry needling;Passive range of motion;Scar mobilization   PT Next Visit Plan LE functional strengthening, balance activity progression   PT Home Exercise Plan knee flexion stretch, ascend/descend steps with handrails and step over step pattern, SLS at counter       Patient will benefit from skilled therapeutic intervention in order to improve the following deficits and impairments:   Decreased balance, Decreased strength, Pain, Decreased scar mobility, Decreased range of motion, Increased muscle spasms  Visit Diagnosis: Acute pain of right knee  Other abnormalities of gait and mobility  Other symptoms and signs involving the musculoskeletal system  Problem List Patient Active Problem List   Diagnosis Date Noted  . Lumbago 03/22/2014  . Stiffness of joint, not elsewhere classified, pelvic region and thigh 03/22/2014  . Muscle weakness (generalized) 03/22/2014  . Abnormality of gait 03/22/2014  . Depression with anxiety 09/03/2012  . Insomnia secondary to depression with anxiety 09/03/2012  . Pain 09/03/2012   Ihor Austin, [place]; [place]  Aldona Lento 06/18/2016, 5:08 PM  [facility] [address] [place], [place], [address] Phone: [phone]   Fax:  [phone]  Name: Amy [name] MRN: [id number] Date of Birth: [birth year]

## 2016-06-24 ENCOUNTER — Ambulatory Visit (HOSPITAL_COMMUNITY): Payer: Self-pay | Admitting: Psychology

## 2016-06-25 ENCOUNTER — Ambulatory Visit (HOSPITAL_COMMUNITY): Payer: Medicare HMO | Admitting: Physical Therapy

## 2016-06-26 DIAGNOSIS — R928 Other abnormal and inconclusive findings on diagnostic imaging of breast: Secondary | ICD-10-CM | POA: Diagnosis not present

## 2016-06-26 DIAGNOSIS — N6311 Unspecified lump in the right breast, upper outer quadrant: Secondary | ICD-10-CM | POA: Diagnosis not present

## 2016-06-26 DIAGNOSIS — N6312 Unspecified lump in the right breast, upper inner quadrant: Secondary | ICD-10-CM | POA: Diagnosis not present

## 2016-06-27 ENCOUNTER — Ambulatory Visit (HOSPITAL_COMMUNITY): Payer: Medicare HMO | Admitting: Physical Therapy

## 2016-06-27 DIAGNOSIS — R29898 Other symptoms and signs involving the musculoskeletal system: Secondary | ICD-10-CM

## 2016-06-27 DIAGNOSIS — E1151 Type 2 diabetes mellitus with diabetic peripheral angiopathy without gangrene: Secondary | ICD-10-CM | POA: Diagnosis not present

## 2016-06-27 DIAGNOSIS — M25561 Pain in right knee: Secondary | ICD-10-CM

## 2016-06-27 DIAGNOSIS — R2689 Other abnormalities of gait and mobility: Secondary | ICD-10-CM

## 2016-06-27 DIAGNOSIS — E1140 Type 2 diabetes mellitus with diabetic neuropathy, unspecified: Secondary | ICD-10-CM | POA: Diagnosis not present

## 2016-06-27 NOTE — Therapy (Signed)
[facility] [address] [place], [place], [address] Phone: [phone]   Fax:  [phone]  Physical Therapy Treatment/Discharge   Patient Details  Name: Amy [name] MRN: [id number] Date of Birth: February 09, 1936 Referring Provider: Earlie Server, MD  Encounter Date: 06/27/2016      PT End of Session - 06/27/16 0849    Visit Number 3   Number of Visits 6   Date for PT Re-Evaluation 06/25/16   Authorization Type [employer] medicare   Authorization Time Period 06/04/16 to 07/16/16   PT Start Time 0817   PT Stop Time 0854   PT Time Calculation (min) 37 min   Activity Tolerance Patient tolerated treatment well;No increased pain   Behavior During Therapy WFL for tasks assessed/performed      Past Medical History:  Diagnosis Date  . Anxiety   . Back pain   . Colon polyps   . Depression   . Diabetes mellitus, type II ([place])    diet controlled  . Hypercholesterolemia     Past Surgical History:  Procedure Laterality Date  . APPENDECTOMY    . BREAST SURGERY    . BUNIONECTOMY    . FACIAL COSMETIC SURGERY     drooping eyelid and brow  . HERNIA REPAIR    . SHOULDER SURGERY    . TEMPOROMANDIBULAR JOINT SURGERY      There were no vitals filed for this visit.      Subjective Assessment - 06/27/16 0820    Subjective Pt states that her knee is doing pretty good unless she twists it. Her feet are really bothering her with callouses and pain on the base of her 1st MTP. She requests that we take things a little easy today until she can see her podiatrist this evening.    Pertinent History anxiety/depression, LBP, DM, depression   Patient Stated Goals improve the pain and make sure she has as much ROM as she should    Currently in Pain? No/denies            [facility] PT Assessment - 06/27/16 0001      Assessment   Medical Diagnosis Rt knee scope    Referring Provider Earlie Server, MD   Onset Date/Surgical Date 05/29/16   Next MD  Visit She does not go back.    Prior Therapy none      Precautions   Precautions None     Restrictions   Weight Bearing Restrictions No     Home Environment   Living Environment Private residence     Prior Function   Level of Independence Independent   Leisure walking, helping out with her nephew who recently broke his leg      Cognition   Overall Cognitive Status Within Functional Limits for tasks assessed     Observation/Other Assessments   Observations --     Sensation   Light Touch Appears Intact     AROM   Right Knee Extension 0   Right Knee Flexion 140   Left Knee Extension 0   Left Knee Flexion 145     Strength   Right Hip Flexion 5/5   Right Hip Extension 5/5   Right Hip ABduction 4+/5   Left Hip Flexion 4+/5   Left Hip Extension 5/5   Left Hip ABduction 4+/5   Right Knee Flexion 5/5   Right Knee Extension 5/5   Left Knee Flexion 5/5   Left Knee Extension 5/5   Right Ankle  Dorsiflexion 5/5   Left Ankle Dorsiflexion 5/5     Palpation   Palpation comment Incision intact and healed      Transfers   Five time sit to stand comments  10 sec, no UE     Ambulation/Gait   Ambulation/Gait --   Ambulation Distance (Feet) --   Assistive device --   Gait Pattern --     Standardized Balance Assessment   Standardized Balance Assessment Timed Up and Go Test     Timed Up and Go Test   TUG Comments 9 sec, no AD  past assessment     High Level Balance   High Level Balance Comments SLS: Rt 5 sec max, Lt: 9 sec max   partially limited due to pain in her feet Rt>Lt                     [facility] Adult PT Treatment/Exercise - 06/27/16 0001      Knee/Hip Exercises: Supine   Bridges Limitations x15 reps    Other Supine Knee/Hip Exercises hip abduction ball squeeze 2x10 reps      Knee/Hip Exercises: Sidelying   Clams 2x15 with red band                 PT Education - 06/27/16 0946    Education provided Yes   Education Details goals met/  progress; continued HEP adherence and balance actvitiy to ensure this improves further once her feet are addressed by podiatrist.    Person(s) Educated Patient   Methods Explanation   Comprehension Verbalized understanding          PT Short Term Goals - 06/27/16 0834      PT SHORT TERM GOAL #1   Title Pt will demo consistency and independence with her HEP to improve BLE strength and Rt knee ROM   Time 2   Period Weeks   Status Achieved     PT SHORT TERM GOAL #2   Title Pt will demo improved Rt knee flexion ROM to atleast 130 deg to improve her overall mobility.    Baseline 140 degrees   Time 6   Period Weeks   Status Achieved           PT Long Term Goals - 06/27/16 9179      PT LONG TERM GOAL #1   Title Pt will demo improved BLE strength to 5/5 MMT to increase her safety with functional tasks at home.    Baseline 4+ and 5/5   Time 6   Period Weeks   Status Achieved     PT LONG TERM GOAL #2   Title Pt will demo improved functional strength evident by her ability to perform 5x sit to stand in less than 10 sec without UE support or report of pain.    Baseline 10 sec   Time 6   Period Weeks   Status Achieved     PT LONG TERM GOAL #3   Title Pt will demo improved balance evident by her ability to maintain SLS on each LE up to 10 sec, 2/3 trials without LOB, to decrease risk of falling.    Baseline Rt 5 sec, Lt 10 sec    Time 6   Period Weeks   Status Partially Met     PT LONG TERM GOAL #4   Title Pt will demo improved functional strength of her RLE evident by her ability to ascend/descend 6" steps with reciprocal pattern and with  no more than minimal pain report, x3 trials.    Baseline achieved at last session   Time 6   Period Weeks   Status Achieved               Plan - 07/10/2016 0849    Clinical Impression Statement Ms. Storck has made great progress since beginning PT several weeks ago. She demonstrates improved BLE strength, full knee ROM,  excellent performance of functional testing and improved single leg balance. She has no reported limitations in activity due to her knee at this time and her only limitation at this time is the pain in her feet secondary to excess callousing. I discussed the importance of continuing with her previous HEP and going on daily walks to improve her endurance. Ended session with some exercises to address hip strength as well, with report of fatigue by the end of the session. She no longer requires skilled PT to address her limitations and is being discharged with all goals met. Pt voicing concern about her excess weight loss, so I encouraged her to notify PCP if they are not already aware of this. She verbalized understanding and will follow up with them.   Rehab Potential Good   PT Frequency 1x / week   PT Duration 6 weeks   PT Treatment/Interventions ADLs/Self Care Home Management;Cryotherapy;Gait training;Stair training;Functional mobility training;Therapeutic activities;Therapeutic exercise;Patient/family education;Neuromuscular re-education;Balance training;Manual techniques;Dry needling;Passive range of motion;Scar mobilization   PT Next Visit Plan d/c    PT Home Exercise Plan knee flexion stretch, ascend/descend steps with handrails and step over step pattern, SLS at counter    Recommended Other Services none    Consulted and Agree with Plan of Care Patient      Patient will benefit from skilled therapeutic intervention in order to improve the following deficits and impairments:  Decreased balance, Decreased strength, Pain, Decreased scar mobility, Decreased range of motion, Increased muscle spasms  Visit Diagnosis: Acute pain of right knee  Other abnormalities of gait and mobility  Other symptoms and signs involving the musculoskeletal system       G-Codes - 07-10-16 0900    Functional Assessment Tool Used clinical judgement based on assessment of ROM, strength and mobility   Functional  Limitation Mobility: Walking and moving around   Mobility: Walking and Moving Around Goal Status [phone]) At least 1 percent but less than 20 percent impaired, limited or restricted   Mobility: Walking and Moving Around Discharge Status [phone]) At least 1 percent but less than 20 percent impaired, limited or restricted      Problem List Patient Active Problem List   Diagnosis Date Noted  . Lumbago 03/22/2014  . Stiffness of joint, not elsewhere classified, pelvic region and thigh 03/22/2014  . Muscle weakness (generalized) 03/22/2014  . Abnormality of gait 03/22/2014  . Depression with anxiety 09/03/2012  . Insomnia secondary to depression with anxiety 09/03/2012  . Pain 09/03/2012    PHYSICAL THERAPY DISCHARGE SUMMARY  Visits from Start of Care: 3  Current functional level related to goals / functional outcomes: Strength atleast 4+/5 MMT or higher, improved SLS, improved activity tolerance with knee discomfort, improved functional strength and great performance on 5x sit to stand.   Remaining deficits: SLS improved but still less than 10 sec, this could easily be attributed to pt's foot pain from calloused MTP heads. All areas will continue to improve with HEP adherence    Education / Equipment: Encouraged daily walking, HEP adherence several times a week. Continue with  updated HEP  Plan: Patient agrees to discharge.  Patient goals were met. Patient is being discharged due to meeting the stated rehab goals.  ?????        Elly Modena 06/27/2016, 9:46 AM  [facility] [place], [place], [address] Phone: [phone]   Fax:  [phone]  Name: Amy [name] MRN: [id number] Date of Birth: May 21, 1936

## 2016-07-02 ENCOUNTER — Encounter (HOSPITAL_COMMUNITY): Payer: Self-pay | Admitting: Physical Therapy

## 2016-07-02 DIAGNOSIS — L280 Lichen simplex chronicus: Secondary | ICD-10-CM | POA: Diagnosis not present

## 2016-07-02 DIAGNOSIS — L0100 Impetigo, unspecified: Secondary | ICD-10-CM | POA: Diagnosis not present

## 2016-07-02 DIAGNOSIS — L570 Actinic keratosis: Secondary | ICD-10-CM | POA: Diagnosis not present

## 2016-07-03 DIAGNOSIS — D241 Benign neoplasm of right breast: Secondary | ICD-10-CM | POA: Diagnosis not present

## 2016-07-03 DIAGNOSIS — N6001 Solitary cyst of right breast: Secondary | ICD-10-CM | POA: Diagnosis not present

## 2016-07-07 DIAGNOSIS — R69 Illness, unspecified: Secondary | ICD-10-CM | POA: Diagnosis not present

## 2016-07-09 ENCOUNTER — Encounter (HOSPITAL_COMMUNITY): Payer: Self-pay | Admitting: Physical Therapy

## 2016-07-09 DIAGNOSIS — L0100 Impetigo, unspecified: Secondary | ICD-10-CM | POA: Diagnosis not present

## 2016-07-09 DIAGNOSIS — E1151 Type 2 diabetes mellitus with diabetic peripheral angiopathy without gangrene: Secondary | ICD-10-CM | POA: Diagnosis not present

## 2016-07-09 DIAGNOSIS — L309 Dermatitis, unspecified: Secondary | ICD-10-CM | POA: Diagnosis not present

## 2016-07-09 DIAGNOSIS — E1140 Type 2 diabetes mellitus with diabetic neuropathy, unspecified: Secondary | ICD-10-CM | POA: Diagnosis not present

## 2016-07-15 ENCOUNTER — Ambulatory Visit (INDEPENDENT_AMBULATORY_CARE_PROVIDER_SITE_OTHER): Payer: Medicare HMO | Admitting: Psychiatry

## 2016-07-15 ENCOUNTER — Encounter (HOSPITAL_COMMUNITY): Payer: Self-pay | Admitting: Psychiatry

## 2016-07-15 VITALS — BP 136/80 | Ht 64.0 in | Wt 163.0 lb

## 2016-07-15 DIAGNOSIS — Z79899 Other long term (current) drug therapy: Secondary | ICD-10-CM

## 2016-07-15 DIAGNOSIS — F411 Generalized anxiety disorder: Secondary | ICD-10-CM | POA: Diagnosis not present

## 2016-07-15 DIAGNOSIS — Z888 Allergy status to other drugs, medicaments and biological substances status: Secondary | ICD-10-CM | POA: Diagnosis not present

## 2016-07-15 DIAGNOSIS — F332 Major depressive disorder, recurrent severe without psychotic features: Secondary | ICD-10-CM | POA: Diagnosis not present

## 2016-07-15 DIAGNOSIS — R69 Illness, unspecified: Secondary | ICD-10-CM | POA: Diagnosis not present

## 2016-07-15 DIAGNOSIS — F418 Other specified anxiety disorders: Secondary | ICD-10-CM

## 2016-07-15 DIAGNOSIS — Z9889 Other specified postprocedural states: Secondary | ICD-10-CM

## 2016-07-15 MED ORDER — ESCITALOPRAM OXALATE 20 MG PO TABS: 20.0000 mg | ORAL_TABLET | Freq: Every day | ORAL | 2 refills | Status: DC

## 2016-07-15 MED ORDER — VENLAFAXINE HCL ER 75 MG PO CP24: 75.0000 mg | ORAL_CAPSULE | Freq: Two times a day (BID) | ORAL | 2 refills | Status: DC

## 2016-07-15 MED ORDER — RISPERIDONE 0.5 MG PO TABS: 0.5000 mg | ORAL_TABLET | Freq: Every day | ORAL | 2 refills | Status: DC

## 2016-07-15 MED ORDER — ALPRAZOLAM 0.5 MG PO TABS: 0.5000 mg | ORAL_TABLET | Freq: Three times a day (TID) | ORAL | 2 refills | Status: DC | PRN
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
## 2016-07-16 DIAGNOSIS — E1151 Type 2 diabetes mellitus with diabetic peripheral angiopathy without gangrene: Secondary | ICD-10-CM | POA: Diagnosis not present

## 2016-07-16 DIAGNOSIS — L89892 Pressure ulcer of other site, stage 2: Secondary | ICD-10-CM | POA: Diagnosis not present

## 2016-07-16 DIAGNOSIS — S61419A Laceration without foreign body of unspecified hand, initial encounter: Secondary | ICD-10-CM | POA: Diagnosis not present

## 2016-07-16 DIAGNOSIS — L280 Lichen simplex chronicus: Secondary | ICD-10-CM | POA: Diagnosis not present

## 2016-07-16 DIAGNOSIS — E1140 Type 2 diabetes mellitus with diabetic neuropathy, unspecified: Secondary | ICD-10-CM | POA: Diagnosis not present

## 2016-07-30 ENCOUNTER — Other Ambulatory Visit (HOSPITAL_COMMUNITY): Payer: Self-pay | Admitting: General Surgery

## 2016-07-30 DIAGNOSIS — D241 Benign neoplasm of right breast: Secondary | ICD-10-CM

## 2016-07-31 DIAGNOSIS — L280 Lichen simplex chronicus: Secondary | ICD-10-CM | POA: Diagnosis not present

## 2016-07-31 NOTE — H&P (Signed)
  NTS SOAP Note  Vital Signs:  Vitals as of: [id number]: Systolic A999333: Diastolic 72: Heart Rate 90: Temp 97.54F (Temporal): Height 34ft 4in: Weight 167Lbs 0 Ounces: BMI 28.67   BMI : 28.67 kg/m2  Subjective: This 81 year old female presents for of right breast papilloma.  Referred by [facility].  Has had a right breast biopsy in remote past for benign disease.  No lump noted by patient.  No nipple discharge.  Sister had breast cancer at an early age.    Review of Symptoms:  Constitutional:negative Head:negative Eyes:negative Nose/Mouth/Throat:negative Cardiovascular:negative Respiratory:negative Gastrointestinnegative Genitourinary:negative joint, neck, and back pain dry skin as above Hematolgic/Lymphatic:negative Allergic/Immunologic:negative   Past Medical History:Reviewed  Past Medical History  Surgical History: appendectomy, riught breast cyst excision, bunionectomies, femoral and inguinal herniorrhaphies Medical Problems: NIDDM, arthritis, depression Medications: effexor, lexapro, xanax, percocet, risperdal, valtrax, glucophage, meloxicam   Social History:Reviewed  Social History  Preferred Language: English Race:  White Ethnicity: Not Hispanic / Latino Age: 10 year Marital Status:  D Alcohol: no   Smoking Status: Former smoker reviewed on 07/30/2016 Started Date: 07/30/1995 Stopped Date:  Functional Status reviewed on 07/30/2016 ------------------------------------------------ Bathing: Normal Cooking: Normal Dressing: Normal Driving: Normal Eating: Normal Managing Meds: Normal Oral Care: Normal Shopping: Normal Toileting: Normal Transferring: Normal Walking: Disablilty - arthritis Cognitive Status reviewed on 07/30/2016 ------------------------------------------------ Attention: Normal Decision Making: Normal Language: Normal Memory: Normal Motor: Normal Perception: Normal Problem Solving: Normal Visual and Spatial:  Normal   Family History:Reviewed  Family Health History Mother, Deceased; Colon cancer;  Father, Deceased; Lung cancer;  Sister, Unknown; Breast cancer;     Objective Information: General:Well appearing, well nourished in no distress. Head:Atraumatic; no masses; no abnormalities Neck:Supple without lymphadenopathy.  Heart:RRR, no murmur or gallop.  Normal S1, S2.  No S3, S4.  Lungs:CTA bilaterally, no wheezes, rhonchi, rales.  Breathing unlabored. No dominant mass, nipple discharge, dimpling in either breast.  Surgical scar noted in upper, inner quadrant.  Axillas negative for palpable nodes. Mammogram and pathology reports reviewed. Assessment:Papilloma, right breast  Diagnoses: 3  D24.1 Duct papilloma of breast (Benign neoplasm of right breast)  Procedures: [id number] - OFFICE OUTPATIENT NEW 30 MINUTES    Plan:  Scheduled for right breast biopsy after needle localization on 08/08/15.   Patient Education:Alternative treatments to surgery were discussed with patient (and family).Risks and benefits  of procedure including bleeding and infection were fully explained to the patient (and family) who gave informed consent. Patient/family questions were addressed.  Follow-up:Pending Surgery

## 2016-08-01 NOTE — Patient Instructions (Signed)
Your procedure is scheduled on: 08/07/2016  Report to [facility] at    7:30 AM.  Call this number if you have problems the morning of surgery: [phone]   Remember:   Do not drink or eat food:After Midnight.  :  Take these medicines the morning of surgery with A SIP OF WATER: Lexapro, Xanax and Effexor   Do not wear jewelry, make-up or nail polish.  Do not wear lotions, powders, or perfumes. You may wear deodorant.  Do not shave 48 hours prior to surgery. Men may shave face and neck.  Do not bring valuables to the hospital.  Contacts, dentures or bridgework may not be worn into surgery.  Leave suitcase in the car. After surgery it may be brought to your room.  For patients admitted to the hospital, checkout time is 11:00 AM the day of discharge.   Patients discharged the day of surgery will not be allowed to drive home.    Special Instructions: Shower using CHG night before surgery and shower the day of surgery use CHG.  Use special wash - you have one bottle of CHG for all showers.  You should use approximately 1/2 of the bottle for each shower.   Breast Biopsy, Care After Introduction These instructions give you information about caring for yourself after your procedure. Your doctor may also give you more specific instructions. Call your doctor if you have any problems or questions after your procedure. Follow these instructions at home: Medicines  Take over-the-counter and prescription medicines only as told by your doctor.  Do not drive for 24 hours if you received a sedative.  Do not drink alcohol while taking pain medicine.  Do not drive or use heavy machinery while taking prescription pain medicine. Biopsy Site Care   Follow instructions from your doctor about how to take care of your cut from surgery (incision) or puncture area. Make sure you:  Wash your hands with soap and water before you change your bandage. If you cannot use soap and water, use hand  sanitizer.  Change any bandages (dressings) as told by your doctor.  Leave any stitches (sutures), skin glue, or skin tape (adhesive) strips in place. They may need to stay in place for 2 weeks or longer. If tape strips get loose and curl up, you may trim the loose edges. Do not remove tape strips completely unless your doctor says it is okay.  If you have stitches, keep them dry when you take a bath or a shower.  Check your cut or puncture area every day for signs of infection. Check for:  More redness, swelling, or pain.  More fluid or blood.  Warmth.  Pus or a bad smell.  Protect the biopsy area. Do not let the area get bumped. Activity  Avoid activities that could pull the biopsy site open.  Avoid stretching.  Avoid reaching.  Avoid exercise.  Avoid sports.  Avoid lifting anything that is heavier than 3 pounds (1.4 kg).  Return to your normal activities as told by your doctor. Ask your doctor what activities are safe for you. General instructions  Continue your normal diet.  Wear a good support bra for as long as told by your doctor.  Get checked for extra fluid in your body (lymphedema) as often as told by your doctor.  Keep all follow-up visits as told by your doctor. This is important. Contact a health care provider if:  You have more redness, swelling, or pain at the  biopsy site.  You have more fluid or blood coming from your biopsy site.  Your biopsy site feels warm to the touch.  You have pus or a bad smell coming from the biopsy site.  Your biopsy site breaks open after the stitches, staples, or skin tape strips have been removed.  You have a rash.  You have a fever. Get help right away if:  You have more bleeding (more than a small spot) from the biopsy site.  You have trouble breathing.  You have red streaks around the biopsy site. This information is not intended to replace advice given to you by your health care provider. Make sure you  discuss any questions you have with your health care provider. Document Released: 05/11/2009 Document Revised: 03/21/2016 Document Reviewed: 04/18/2015  2017 Elsevier General Anesthesia, Adult, Care After These instructions provide you with information about caring for yourself after your procedure. Your health care provider may also give you more specific instructions. Your treatment has been planned according to current medical practices, but problems sometimes occur. Call your health care provider if you have any problems or questions after your procedure. What can I expect after the procedure? After the procedure, it is common to have:  Vomiting.  A sore throat.  Mental slowness. It is common to feel:  Nauseous.  Cold or shivery.  Sleepy.  Tired.  Sore or achy, even in parts of your body where you did not have surgery. Follow these instructions at home: For at least 24 hours after the procedure:  Do not:  Participate in activities where you could fall or become injured.  Drive.  Use heavy machinery.  Drink alcohol.  Take sleeping pills or medicines that cause drowsiness.  Make important decisions or sign legal documents.  Take care of children on your own.  Rest. Eating and drinking  If you vomit, drink water, juice, or soup when you can drink without vomiting.  Drink enough fluid to keep your urine clear or pale yellow.  Make sure you have little or no nausea before eating solid foods.  Follow the diet recommended by your health care provider. General instructions  Have a responsible adult stay with you until you are awake and alert.  Return to your normal activities as told by your health care provider. Ask your health care provider what activities are safe for you.  Take over-the-counter and prescription medicines only as told by your health care provider.  If you smoke, do not smoke without supervision.  Keep all follow-up visits as told by your  health care provider. This is important. Contact a health care provider if:  You continue to have nausea or vomiting at home, and medicines are not helpful.  You cannot drink fluids or start eating again.  You cannot urinate after 8-12 hours.  You develop a skin rash.  You have fever.  You have increasing redness at the site of your procedure. Get help right away if:  You have difficulty breathing.  You have chest pain.  You have unexpected bleeding.  You feel that you are having a life-threatening or urgent problem. This information is not intended to replace advice given to you by your health care provider. Make sure you discuss any questions you have with your health care provider. Document Released: 10/21/2000 Document Revised: 12/18/2015 Document Reviewed: 06/29/2015 Elsevier Interactive Patient Education  2017 [employer].

## 2016-08-02 ENCOUNTER — Encounter (HOSPITAL_COMMUNITY): Payer: Self-pay

## 2016-08-05 ENCOUNTER — Other Ambulatory Visit: Payer: Self-pay

## 2016-08-05 ENCOUNTER — Inpatient Hospital Stay (HOSPITAL_COMMUNITY)
Admission: RE | Admit: 2016-08-05 | Discharge: 2016-08-05 | Disposition: A | Discharge status: Home / Self Care | Payer: Self-pay | Source: Ambulatory Visit

## 2016-08-05 ENCOUNTER — Encounter (HOSPITAL_COMMUNITY)
Admission: RE | Admit: 2016-08-05 | Discharge: 2016-08-05 | Disposition: A | Discharge status: Home / Self Care | Payer: Medicare HMO | Source: Ambulatory Visit | Attending: General Surgery | Admitting: General Surgery

## 2016-08-05 DIAGNOSIS — R9431 Abnormal electrocardiogram [ECG] [EKG]: Secondary | ICD-10-CM | POA: Diagnosis not present

## 2016-08-05 DIAGNOSIS — I10 Essential (primary) hypertension: Secondary | ICD-10-CM | POA: Diagnosis not present

## 2016-08-05 DIAGNOSIS — I491 Atrial premature depolarization: Secondary | ICD-10-CM | POA: Diagnosis not present

## 2016-08-05 DIAGNOSIS — R000 Tachycardia, unspecified: Secondary | ICD-10-CM | POA: Diagnosis not present

## 2016-08-05 DIAGNOSIS — I700 Atherosclerosis of aorta: Secondary | ICD-10-CM | POA: Diagnosis not present

## 2016-08-05 DIAGNOSIS — R05 Cough: Secondary | ICD-10-CM | POA: Diagnosis not present

## 2016-08-06 DIAGNOSIS — C50919 Malignant neoplasm of unspecified site of unspecified female breast: Secondary | ICD-10-CM | POA: Diagnosis not present

## 2016-08-06 DIAGNOSIS — I10 Essential (primary) hypertension: Secondary | ICD-10-CM | POA: Diagnosis not present

## 2016-08-06 DIAGNOSIS — J209 Acute bronchitis, unspecified: Secondary | ICD-10-CM | POA: Diagnosis not present

## 2016-08-06 DIAGNOSIS — E119 Type 2 diabetes mellitus without complications: Secondary | ICD-10-CM | POA: Diagnosis not present

## 2016-08-07 ENCOUNTER — Encounter (HOSPITAL_COMMUNITY): Admission: RE | Payer: Self-pay | Source: Ambulatory Visit

## 2016-08-07 ENCOUNTER — Ambulatory Visit (HOSPITAL_COMMUNITY)
Admission: RE | Admit: 2016-08-07 | Discharge: 2016-08-07 | Disposition: A | Discharge status: Home / Self Care | Payer: Medicare HMO | Source: Ambulatory Visit | Attending: General Surgery | Admitting: General Surgery

## 2016-08-07 ENCOUNTER — Ambulatory Visit (HOSPITAL_COMMUNITY)
Admission: RE | Admit: 2016-08-07 | Discharge: 2016-08-07 | Disposition: A | Discharge status: Home / Self Care | Payer: Medicare HMO | Source: Ambulatory Visit | Attending: Pulmonary Disease | Admitting: Pulmonary Disease

## 2016-08-07 ENCOUNTER — Ambulatory Visit (HOSPITAL_COMMUNITY): Admission: RE | Admit: 2016-08-07 | Payer: Medicare HMO | Source: Ambulatory Visit | Admitting: General Surgery

## 2016-08-07 ENCOUNTER — Other Ambulatory Visit (HOSPITAL_COMMUNITY): Payer: Self-pay | Admitting: General Surgery

## 2016-08-07 ENCOUNTER — Other Ambulatory Visit (HOSPITAL_COMMUNITY): Payer: Self-pay | Admitting: Pulmonary Disease

## 2016-08-07 ENCOUNTER — Encounter (HOSPITAL_COMMUNITY): Payer: Self-pay

## 2016-08-07 DIAGNOSIS — R059 Cough, unspecified: Secondary | ICD-10-CM

## 2016-08-07 DIAGNOSIS — D241 Benign neoplasm of right breast: Secondary | ICD-10-CM

## 2016-08-07 DIAGNOSIS — N6310 Unspecified lump in the right breast, unspecified quadrant: Secondary | ICD-10-CM

## 2016-08-07 DIAGNOSIS — I491 Atrial premature depolarization: Secondary | ICD-10-CM | POA: Insufficient documentation

## 2016-08-07 DIAGNOSIS — I10 Essential (primary) hypertension: Secondary | ICD-10-CM | POA: Insufficient documentation

## 2016-08-07 DIAGNOSIS — R52 Pain, unspecified: Secondary | ICD-10-CM

## 2016-08-07 DIAGNOSIS — R9431 Abnormal electrocardiogram [ECG] [EKG]: Secondary | ICD-10-CM | POA: Insufficient documentation

## 2016-08-07 DIAGNOSIS — R05 Cough: Secondary | ICD-10-CM | POA: Diagnosis not present

## 2016-08-07 DIAGNOSIS — I700 Atherosclerosis of aorta: Secondary | ICD-10-CM | POA: Insufficient documentation

## 2016-08-07 DIAGNOSIS — R000 Tachycardia, unspecified: Secondary | ICD-10-CM | POA: Insufficient documentation

## 2016-08-07 SURGERY — BREAST BIOPSY WITH NEEDLE LOCALIZATION
Anesthesia: General | Laterality: Right

## 2016-08-21 ENCOUNTER — Telehealth (HOSPITAL_COMMUNITY): Payer: Self-pay | Admitting: *Deleted

## 2016-08-21 NOTE — Telephone Encounter (Signed)
Prior authorization for Venlafaxine ER received. Called [phone] spoke with Levada Dy who states it will be approved until 07/28/17 [id number].

## 2016-08-22 NOTE — Telephone Encounter (Signed)
noted 

## 2016-08-23 DIAGNOSIS — M1711 Unilateral primary osteoarthritis, right knee: Secondary | ICD-10-CM | POA: Diagnosis not present

## 2016-08-23 DIAGNOSIS — S83241D Other tear of medial meniscus, current injury, right knee, subsequent encounter: Secondary | ICD-10-CM | POA: Diagnosis not present

## 2016-08-23 DIAGNOSIS — M25511 Pain in right shoulder: Secondary | ICD-10-CM | POA: Diagnosis not present

## 2016-08-30 ENCOUNTER — Telehealth (HOSPITAL_COMMUNITY): Payer: Self-pay | Admitting: *Deleted

## 2016-08-30 NOTE — Telephone Encounter (Signed)
[employer] faxed office prior auth approval to office for pt Venlafaxine HCL ER 75 mg Cap ER 24H 60 tablets per 30. Approval is form 07-29-2016 until 07-28-2017.

## 2016-09-04 DIAGNOSIS — R69 Illness, unspecified: Secondary | ICD-10-CM | POA: Diagnosis not present

## 2016-09-04 DIAGNOSIS — M159 Polyosteoarthritis, unspecified: Secondary | ICD-10-CM | POA: Diagnosis not present

## 2016-09-04 DIAGNOSIS — E1165 Type 2 diabetes mellitus with hyperglycemia: Secondary | ICD-10-CM | POA: Diagnosis not present

## 2016-09-04 DIAGNOSIS — I10 Essential (primary) hypertension: Secondary | ICD-10-CM | POA: Diagnosis not present

## 2016-09-06 DIAGNOSIS — I10 Essential (primary) hypertension: Secondary | ICD-10-CM | POA: Diagnosis not present

## 2016-09-06 DIAGNOSIS — E1165 Type 2 diabetes mellitus with hyperglycemia: Secondary | ICD-10-CM | POA: Diagnosis not present

## 2016-09-06 DIAGNOSIS — M1711 Unilateral primary osteoarthritis, right knee: Secondary | ICD-10-CM | POA: Diagnosis not present

## 2016-09-06 DIAGNOSIS — M25511 Pain in right shoulder: Secondary | ICD-10-CM | POA: Diagnosis not present

## 2016-09-06 DIAGNOSIS — R69 Illness, unspecified: Secondary | ICD-10-CM | POA: Diagnosis not present

## 2016-09-06 DIAGNOSIS — M159 Polyosteoarthritis, unspecified: Secondary | ICD-10-CM | POA: Diagnosis not present

## 2016-09-06 DIAGNOSIS — S83241D Other tear of medial meniscus, current injury, right knee, subsequent encounter: Secondary | ICD-10-CM | POA: Diagnosis not present

## 2016-09-25 NOTE — H&P (Signed)
NTS SOAP Note  Vital Signs:  Vitals as of: 123456: Systolic [id number]: Diastolic 72: Heart Rate 90: Temp 97.83F (Temporal): Height 104ft 4in: Weight 167Lbs 0 Ounces: BMI 28.67   BMI : 28.67 kg/m2  Subjective: This 81 year old female presents for of right breast papilloma.  Referred by [facility].  Has had a right breast biopsy in remote past for benign disease.  No lump noted by patient.  No nipple discharge.  Sister had breast cancer at an early age.    Review of Symptoms:  Constitutional:negative Head:negative Eyes:negative Nose/Mouth/Throat:negative Cardiovascular:negative Respiratory:negative Gastrointestinnegative Genitourinary:negative joint, neck, and back pain dry skin as above Hematolgic/Lymphatic:negative Allergic/Immunologic:negative   Past Medical History:Reviewed  Past Medical History  Surgical History: appendectomy, riught breast cyst excision, bunionectomies, femoral and inguinal herniorrhaphies Medical Problems: NIDDM, arthritis, depression Medications: effexor, lexapro, xanax, percocet, risperdal, valtrax, glucophage, meloxicam   Social History:Reviewed  Social History  Preferred Language: English Race:  White Ethnicity: Not Hispanic / Latino Age: 81 year Marital Status:  D Alcohol: no   Smoking Status: Former smoker reviewed on 07/30/2016 Started Date: 07/30/1995 Stopped Date:  Functional Status reviewed on 07/30/2016 ------------------------------------------------ Bathing: Normal Cooking: Normal Dressing: Normal Driving: Normal Eating: Normal Managing Meds: Normal Oral Care: Normal Shopping: Normal Toileting: Normal Transferring: Normal Walking: Disablilty - arthritis Cognitive Status reviewed on 07/30/2016 ------------------------------------------------ Attention: Normal Decision Making: Normal Language: Normal Memory: Normal Motor: Normal Perception: Normal Problem Solving: Normal Visual and Spatial:  Normal   Family History:Reviewed  Family Health History Mother, Deceased; Colon cancer;  Father, Deceased; Lung cancer;  Sister, Unknown; Breast cancer;     Objective Information: General:Well appearing, well nourished in no distress. Head:Atraumatic; no masses; no abnormalities Neck:Supple without lymphadenopathy.  Heart:RRR, no murmur or gallop.  Normal S1, S2.  No S3, S4.  Lungs:CTA bilaterally, no wheezes, rhonchi, rales.  Breathing unlabored. No dominant mass, nipple discharge, dimpling in either breast.  Surgical scar noted in upper, inner quadrant.  Axillas negative for palpable nodes. Mammogram and pathology reports reviewed. Assessment:Papilloma, right breast  Diagnoses: 45  D24.1 Duct papilloma of breast (Benign neoplasm of right breast)  Procedures: [id number] - OFFICE OUTPATIENT NEW 30 MINUTES    Plan:  Scheduled for right breast biopsy after needle localization on 10/09/16.   Patient Education:Alternative treatments to surgery were discussed with patient (and family).Risks and benefits  of procedure including bleeding and infection were fully explained to the patient (and family) who gave informed consent. Patient/family questions were addressed.  Follow-up:Pending Surgery

## 2016-10-01 NOTE — Patient Instructions (Signed)
Amy [name]  10/01/2016     @PREFPERIOPPHARMACY @   Your procedure is scheduled on 10/09/2016  Report to [facility] at 8:50 A.M.  Call this number if you have problems the morning of surgery:  [phone]   Remember:  Do not eat food or drink liquids after midnight.  Take these medicines the morning of surgery with A SIP OF WATER Xanax, Lexapro, Oxycodone if needed, Effexor, Risperdal  DO NOT TAKE DIABETIC MEDICATION MORNING OF SURGERY   Do not wear jewelry, make-up or nail polish.  Do not wear lotions, powders, or perfumes, or deoderant.  Do not shave 48 hours prior to surgery.  Men may shave face and neck.  Do not bring valuables to the hospital.  [facility] is not responsible for any belongings or valuables.  Contacts, dentures or bridgework may not be worn into surgery.  Leave your suitcase in the car.  After surgery it may be brought to your room.  For patients admitted to the hospital, discharge time will be determined by your treatment team.  Patients discharged the day of surgery will not be allowed to drive home.    Please read over the following fact sheets that you were given. Surgical Site Infection Prevention and Anesthesia Post-op Instructions     PATIENT INSTRUCTIONS POST-ANESTHESIA  IMMEDIATELY FOLLOWING SURGERY:  Do not drive or operate machinery for the first twenty four hours after surgery.  Do not make any important decisions for twenty four hours after surgery or while taking narcotic pain medications or sedatives.  If you develop intractable nausea and vomiting or a severe headache please notify your doctor immediately.  FOLLOW-UP:  Please make an appointment with your surgeon as instructed. You do not need to follow up with anesthesia unless specifically instructed to do so.  WOUND CARE INSTRUCTIONS (if applicable):  Keep a dry clean dressing on the anesthesia/puncture wound site if there is drainage.  Once the wound has quit draining you may  leave it open to air.  Generally you should leave the bandage intact for twenty four hours unless there is drainage.  If the epidural site drains for more than 36-48 hours please call the anesthesia department.  QUESTIONS?:  Please feel free to call your physician or the hospital operator if you have any questions, and they will be happy to assist you.      Breast Biopsy A breast biopsy is a procedure in which a sample of suspicious breast tissue is removed from your breast. Following the procedure, the tissue or liquid that is removed from the breast is examined under a microscope to see if cancerous cells are present. You may need a breast biopsy if you have:  Any undiagnosed breast mass (tumor).  Nipple abnormalities, dimpling, crusting, or ulcerations.  Abnormal discharge from the nipple, especially blood.  Redness, swelling, and pain of the breast.  Calcium deposits (calcifications) or abnormalities seen on a mammogram, ultrasound results, or MRI results.  Suspicious changes in the breast seen on your mammogram. If the breast abnormality is found to be cancerous (malignant), a breast biopsy can help to determine what the best treatment is for you. There are many different types of breast biopsies. Talk with your health care provider about your options and which type is best for you. Tell a health care provider about:  Any allergies you have.  All medicines you are taking, including vitamins, herbs, eye drops, creams, and over-the-counter medicines.  Any problems you or family members  have had with anesthetic medicines.  Any blood disorders you have.  Any surgeries you have had.  Any medical conditions you have.  Whether you are pregnant or may be pregnant. What are the risks? Generally, this is a safe procedure. However, problems may occur, including:  Bleeding.  Infection.  Discomfort. This is temporary.  Allergic reactions to medicines.  Bruising and swelling of  the breast.  Alteration in the shape of the breast.  Damage to other tissues.  Not finding the lump or abnormality.  Needing more surgery. What happens before the procedure?  Plan to have someone take you home after the procedure.  Do not use any tobacco products, such as cigarettes, chewing tobacco, and e-cigarettes. If you need help quitting, ask your health care provider.  Do not drink alcohol for 24 hours before the procedure.  Ask your health care provider about:  Changing or stopping your regular medicines. This is especially important if you are taking diabetes medicines or blood thinners.  Taking medicines such as aspirin and ibuprofen. These medicines can thin your blood. Do not take these medicines before your procedure if your health care provider instructs you not to.  Wear a good support bra to the procedure.  Ask your health care provider how your surgical site will be marked or identified.  You may be given antibiotic medicine to help prevent infection.  Your health care provider may perform a procedure to place a wire (needle localization) or a seed that gives off radiation (radioactive seed localization) in the breast lump. A mammogram, ultrasound, MRI, or a combination of these techniques will be done during this procedure to identify the location of the breast abnormality. The imaging technique used will depend on the type of biopsy you are having. The wire or seed will help the health care provider locate the lump when performing the biopsy, especially if the lump cannot be felt. What happens during the procedure? You may be given one or both of the following:  A medicine to numb the breast area (local anesthetic).  A medicine to help you relax (sedative) during the procedure. The following are the different types of biopsies that can be performed. Fine-Needle Aspiration  A thin needle will be attached to a syringe and inserted into a breast cyst. Fluid and  cells will be removed. This technique is not as common as a core needle biopsy. Core Needle Biopsy  A wide, hollow needle (core needle) will be inserted into a breast lump multiple times to remove tissue samples or cores. Stereotactic Biopsy  You will lie face-down on a table. Your breast will pass through an opening in the table and will be gently compressed into a fixed position. X-ray equipment and a computer will be used to locate the breast lump. The surgeon will use this information to collect several samples of tissue using a needle collection device. Vacuum-Assisted Biopsy  A small incision (less than  inch) will be made in your breast. A biopsy device that includes a hollow needle and vacuum will be passed through the incision and into the breast tissue. The vacuum will gently draw abnormal breast tissue into the needle to remove it. No stitches (sutures) will be needed. The incision will be covered with a bandage (dressing). In this type of biopsy, a larger tissue sample is removed than in a regular core needle biopsy. Ultrasound-Guided Core Needle Biopsy  A high-frequency ultrasound will be used to help guide the core needle to the area  of the mass or abnormality. An incision will be made to insert the needle. Then tissue samples will be removed. Surgical Biopsy  This method requires an incision in the breast to remove part or all of the suspicious tissue. After the tissue is removed, the skin over the area will be closed with sutures and covered with a dressing. There are two types of surgical biopsies:  Incisional biopsy. The surgeon will remove part of the breast lump.  Excisional biopsy. The surgeon will attempt to remove the whole breast lump or as much of it as possible. After any of these procedures, the tissue or liquid that was removed will be examined under a microscope. What happens after the procedure?  You will be taken to the recovery area. If you are doing well and have  no problems, you will be allowed to go home.  You may notice bruising on your breast. This is normal.  You may have a pressure dressing applied on your breast for 24-48 hours. A pressure dressing is a bandage that is wrapped tightly around the chest to stop fluid from collecting underneath tissues. You may also be advised to wear a supportive bra during this time.  Do not drive for 24 hours if you received a sedative. This information is not intended to replace advice given to you by your health care provider. Make sure you discuss any questions you have with your health care provider. Document Released: 07/15/2005 Document Revised: 11/23/2015 Document Reviewed: 04/18/2015 Elsevier Interactive Patient Education  2017 [employer].

## 2016-10-02 ENCOUNTER — Encounter (HOSPITAL_COMMUNITY): Payer: Self-pay

## 2016-10-04 ENCOUNTER — Encounter (HOSPITAL_COMMUNITY): Payer: Self-pay

## 2016-10-04 ENCOUNTER — Encounter (HOSPITAL_COMMUNITY)
Admission: RE | Admit: 2016-10-04 | Discharge: 2016-10-04 | Disposition: A | Discharge status: Home / Self Care | Payer: Medicare HMO | Source: Ambulatory Visit | Attending: General Surgery | Admitting: General Surgery

## 2016-10-04 DIAGNOSIS — D241 Benign neoplasm of right breast: Secondary | ICD-10-CM | POA: Insufficient documentation

## 2016-10-04 DIAGNOSIS — S83241D Other tear of medial meniscus, current injury, right knee, subsequent encounter: Secondary | ICD-10-CM | POA: Diagnosis not present

## 2016-10-04 DIAGNOSIS — Z01812 Encounter for preprocedural laboratory examination: Secondary | ICD-10-CM | POA: Diagnosis not present

## 2016-10-04 DIAGNOSIS — M1711 Unilateral primary osteoarthritis, right knee: Secondary | ICD-10-CM | POA: Diagnosis not present

## 2016-10-04 HISTORY — DX: Unspecified osteoarthritis, unspecified site: M19.90

## 2016-10-04 HISTORY — DX: Excoriation (skin-picking) disorder: F42.4

## 2016-10-04 LAB — CBC WITH DIFFERENTIAL/PLATELET
Basophils Absolute: 0 10*3/uL (ref 0.0–0.1)
Basophils Relative: 0 %
Eosinophils Absolute: 0.2 10*3/uL (ref 0.0–0.7)
Eosinophils Relative: 2 %
HCT: 39.7 % (ref 36.0–46.0)
Hemoglobin: 13.5 g/dL (ref 12.0–15.0)
Lymphocytes Relative: 20 %
Lymphs Abs: 1.8 10*3/uL (ref 0.7–4.0)
MCH: 30.8 pg (ref 26.0–34.0)
MCHC: 34 g/dL (ref 30.0–36.0)
MCV: 90.4 fL (ref 78.0–100.0)
Monocytes Absolute: 0.4 10*3/uL (ref 0.1–1.0)
Monocytes Relative: 5 %
Neutro Abs: 6.5 10*3/uL (ref 1.7–7.7)
Neutrophils Relative %: 73 %
Platelets: 282 10*3/uL (ref 150–400)
RBC: 4.39 MIL/uL (ref 3.87–5.11)
RDW: 13.3 % (ref 11.5–15.5)
WBC: 8.9 10*3/uL (ref 4.0–10.5)

## 2016-10-04 LAB — BASIC METABOLIC PANEL
Anion gap: 9 (ref 5–15)
BUN: 17 mg/dL (ref 6–20)
CO2: 24 mmol/L (ref 22–32)
Calcium: 8.8 mg/dL — ABNORMAL LOW (ref 8.9–10.3)
Chloride: 103 mmol/L (ref 101–111)
Creatinine, Ser: 0.68 mg/dL (ref 0.44–1.00)
GFR calc Af Amer: >60 mL/min (ref >60)
GFR calc non Af Amer: >60 mL/min (ref >60)
Glucose, Bld: 136 mg/dL — ABNORMAL HIGH (ref 65–99)
Potassium: 4.4 mmol/L (ref 3.5–5.1)
Sodium: 136 mmol/L (ref 135–145)

## 2016-10-07 NOTE — Pre-Procedure Instructions (Signed)
Patient in for PAT. She has three quarter sized open areas on her right forearm. Dr Arnoldo Morale notified of this since this is the operative side. No further orders given.

## 2016-10-08 ENCOUNTER — Ambulatory Visit (HOSPITAL_COMMUNITY): Payer: Self-pay | Admitting: Psychiatry

## 2016-10-08 ENCOUNTER — Encounter (HOSPITAL_COMMUNITY): Payer: Self-pay

## 2016-10-09 ENCOUNTER — Ambulatory Visit (HOSPITAL_COMMUNITY)
Admission: RE | Admit: 2016-10-09 | Discharge: 2016-10-09 | Disposition: A | Discharge status: Home / Self Care | Payer: Medicare HMO | Source: Ambulatory Visit | Attending: General Surgery | Admitting: General Surgery

## 2016-10-09 ENCOUNTER — Ambulatory Visit (HOSPITAL_COMMUNITY): Payer: Medicare HMO | Admitting: Anesthesiology

## 2016-10-09 ENCOUNTER — Encounter (HOSPITAL_COMMUNITY)
Admission: RE | Disposition: A | Discharge status: Home / Self Care | Payer: Self-pay | Source: Ambulatory Visit | Attending: General Surgery

## 2016-10-09 ENCOUNTER — Other Ambulatory Visit: Payer: Self-pay | Admitting: General Surgery

## 2016-10-09 DIAGNOSIS — E119 Type 2 diabetes mellitus without complications: Secondary | ICD-10-CM | POA: Diagnosis not present

## 2016-10-09 DIAGNOSIS — R69 Illness, unspecified: Secondary | ICD-10-CM | POA: Diagnosis not present

## 2016-10-09 DIAGNOSIS — Z79899 Other long term (current) drug therapy: Secondary | ICD-10-CM | POA: Diagnosis not present

## 2016-10-09 DIAGNOSIS — N6310 Unspecified lump in the right breast, unspecified quadrant: Secondary | ICD-10-CM

## 2016-10-09 DIAGNOSIS — C50911 Malignant neoplasm of unspecified site of right female breast: Secondary | ICD-10-CM | POA: Diagnosis not present

## 2016-10-09 DIAGNOSIS — D241 Benign neoplasm of right breast: Secondary | ICD-10-CM | POA: Diagnosis not present

## 2016-10-09 DIAGNOSIS — F419 Anxiety disorder, unspecified: Secondary | ICD-10-CM | POA: Diagnosis not present

## 2016-10-09 DIAGNOSIS — Z803 Family history of malignant neoplasm of breast: Secondary | ICD-10-CM | POA: Diagnosis not present

## 2016-10-09 DIAGNOSIS — F329 Major depressive disorder, single episode, unspecified: Secondary | ICD-10-CM | POA: Insufficient documentation

## 2016-10-09 DIAGNOSIS — Z170 Estrogen receptor positive status [ER+]: Secondary | ICD-10-CM | POA: Diagnosis not present

## 2016-10-09 DIAGNOSIS — Z87891 Personal history of nicotine dependence: Secondary | ICD-10-CM | POA: Diagnosis not present

## 2016-10-09 DIAGNOSIS — R928 Other abnormal and inconclusive findings on diagnostic imaging of breast: Secondary | ICD-10-CM | POA: Diagnosis not present

## 2016-10-09 DIAGNOSIS — D493 Neoplasm of unspecified behavior of breast: Secondary | ICD-10-CM

## 2016-10-09 DIAGNOSIS — D0511 Intraductal carcinoma in situ of right breast: Secondary | ICD-10-CM | POA: Diagnosis not present

## 2016-10-09 DIAGNOSIS — Z7984 Long term (current) use of oral hypoglycemic drugs: Secondary | ICD-10-CM | POA: Diagnosis not present

## 2016-10-09 HISTORY — PX: BREAST BIOPSY: SHX20

## 2016-10-09 LAB — GLUCOSE, CAPILLARY
Glucose-Capillary: 171 mg/dL — ABNORMAL HIGH (ref 65–99)
Glucose-Capillary: 162 mg/dL — ABNORMAL HIGH (ref 65–99)

## 2016-10-09 SURGERY — BREAST BIOPSY WITH NEEDLE LOCALIZATION
Anesthesia: General | Laterality: Right

## 2016-10-09 MED ORDER — BUPIVACAINE HCL (PF) 0.5 % IJ SOLN
INTRAMUSCULAR | Status: DC | PRN
  Administered 2016-10-09: 6 mL

## 2016-10-09 MED ORDER — LIDOCAINE HCL (PF) 2 % IJ SOLN
INTRAMUSCULAR | Status: AC
  Filled 2016-10-09: qty 10

## 2016-10-09 MED ORDER — PROPOFOL 10 MG/ML IV BOLUS
INTRAVENOUS | Status: DC | PRN
  Administered 2016-10-09 (×2): 20 mg via INTRAVENOUS
  Administered 2016-10-09: 30 mg via INTRAVENOUS
  Administered 2016-10-09: 130 mg via INTRAVENOUS

## 2016-10-09 MED ORDER — FENTANYL CITRATE (PF) 100 MCG/2ML IJ SOLN
INTRAMUSCULAR | Status: AC
  Filled 2016-10-09: qty 2

## 2016-10-09 MED ORDER — CHLORHEXIDINE GLUCONATE CLOTH 2 % EX PADS: 6.0000 | MEDICATED_PAD | Freq: Once | CUTANEOUS | Status: DC

## 2016-10-09 MED ORDER — ONDANSETRON HCL 4 MG/2ML IJ SOLN
4.0000 mg | Freq: Once | INTRAMUSCULAR | Status: AC
  Administered 2016-10-09: 4 mg via INTRAVENOUS

## 2016-10-09 MED ORDER — MIDAZOLAM HCL 5 MG/5ML IJ SOLN
INTRAMUSCULAR | Status: DC | PRN
  Administered 2016-10-09: 2 mg via INTRAVENOUS

## 2016-10-09 MED ORDER — KETOROLAC TROMETHAMINE 30 MG/ML IJ SOLN
INTRAMUSCULAR | Status: AC
  Filled 2016-10-09: qty 1

## 2016-10-09 MED ORDER — LACTATED RINGERS IV SOLN
INTRAVENOUS | Status: DC
  Administered 2016-10-09: 1000 mL via INTRAVENOUS

## 2016-10-09 MED ORDER — MIDAZOLAM HCL 2 MG/2ML IJ SOLN
1.0000 mg | INTRAMUSCULAR | Status: AC
  Administered 2016-10-09: 2 mg via INTRAVENOUS

## 2016-10-09 MED ORDER — ONDANSETRON HCL 4 MG/2ML IJ SOLN
INTRAMUSCULAR | Status: AC
  Filled 2016-10-09: qty 2

## 2016-10-09 MED ORDER — LIDOCAINE HCL 1 % IJ SOLN
INTRAMUSCULAR | Status: DC | PRN
  Administered 2016-10-09: 25 mg via INTRADERMAL

## 2016-10-09 MED ORDER — MIDAZOLAM HCL 2 MG/2ML IJ SOLN
INTRAMUSCULAR | Status: AC
  Filled 2016-10-09: qty 2

## 2016-10-09 MED ORDER — LIDOCAINE HCL (PF) 1 % IJ SOLN
INTRAMUSCULAR | Status: AC
  Filled 2016-10-09: qty 5

## 2016-10-09 MED ORDER — FENTANYL CITRATE (PF) 100 MCG/2ML IJ SOLN: 25.0000 ug | INTRAMUSCULAR | Status: DC | PRN

## 2016-10-09 MED ORDER — FENTANYL CITRATE (PF) 250 MCG/5ML IJ SOLN
INTRAMUSCULAR | Status: AC
  Filled 2016-10-09: qty 5

## 2016-10-09 MED ORDER — BUPIVACAINE HCL (PF) 0.5 % IJ SOLN
INTRAMUSCULAR | Status: AC
  Filled 2016-10-09: qty 30

## 2016-10-09 MED ORDER — KETOROLAC TROMETHAMINE 30 MG/ML IJ SOLN
30.0000 mg | Freq: Once | INTRAMUSCULAR | Status: AC
  Administered 2016-10-09: 30 mg via INTRAVENOUS

## 2016-10-09 MED ORDER — FENTANYL CITRATE (PF) 100 MCG/2ML IJ SOLN
25.0000 ug | Freq: Once | INTRAMUSCULAR | Status: AC
  Administered 2016-10-09: 25 ug via INTRAVENOUS

## 2016-10-09 MED ORDER — EPHEDRINE SULFATE 50 MG/ML IJ SOLN
INTRAMUSCULAR | Status: AC
  Filled 2016-10-09: qty 1

## 2016-10-09 MED ORDER — FENTANYL CITRATE (PF) 100 MCG/2ML IJ SOLN
INTRAMUSCULAR | Status: DC | PRN
  Administered 2016-10-09 (×2): 50 ug via INTRAVENOUS

## 2016-10-09 MED ORDER — CEFAZOLIN SODIUM-DEXTROSE 2-4 GM/100ML-% IV SOLN
2.0000 g | INTRAVENOUS | Status: AC
  Administered 2016-10-09: 2 g via INTRAVENOUS
  Filled 2016-10-09: qty 100

## 2016-10-09 MED ORDER — OXYCODONE-ACETAMINOPHEN 5-325 MG PO TABS: 1.0000 | ORAL_TABLET | Freq: Four times a day (QID) | ORAL | 0 refills | Status: DC | PRN

## 2016-10-09 MED ORDER — EPHEDRINE SULFATE 50 MG/ML IJ SOLN
INTRAMUSCULAR | Status: DC | PRN
  Administered 2016-10-09: 15 mg via INTRAVENOUS

## 2016-10-09 SURGICAL SUPPLY — 29 items
BAG HAMPER (MISCELLANEOUS) ×2 IMPLANT
BLADE SURG 15 STRL LF DISP TIS (BLADE) ×1 IMPLANT
BLADE SURG 15 STRL SS (BLADE) ×2
CHLORAPREP W/TINT 26ML (MISCELLANEOUS) ×2 IMPLANT
CLOTH BEACON ORANGE TIMEOUT ST (SAFETY) ×2 IMPLANT
COVER LIGHT HANDLE STERIS (MISCELLANEOUS) ×4 IMPLANT
DECANTER SPIKE VIAL GLASS SM (MISCELLANEOUS) ×2 IMPLANT
DERMABOND ADVANCED (GAUZE/BANDAGES/DRESSINGS) ×1
DERMABOND ADVANCED .7 DNX12 (GAUZE/BANDAGES/DRESSINGS) ×1 IMPLANT
ELECT REM PT RETURN 9FT ADLT (ELECTROSURGICAL) ×2
ELECTRODE REM PT RTRN 9FT ADLT (ELECTROSURGICAL) ×1 IMPLANT
FORMALIN 10 PREFIL 120ML (MISCELLANEOUS) ×2 IMPLANT
GLOVE BIOGEL PI IND STRL 7.0 (GLOVE) ×1 IMPLANT
GLOVE BIOGEL PI INDICATOR 7.0 (GLOVE) ×1
GLOVE SURG SS PI 7.5 STRL IVOR (GLOVE) ×4 IMPLANT
GOWN STRL REUS W/TWL LRG LVL3 (GOWN DISPOSABLE) ×6 IMPLANT
KIT ROOM TURNOVER APOR (KITS) ×2 IMPLANT
MANIFOLD NEPTUNE II (INSTRUMENTS) ×2 IMPLANT
NEEDLE HYPO 18GX1.5 BLUNT FILL (NEEDLE) ×2 IMPLANT
NEEDLE HYPO 25X1 1.5 SAFETY (NEEDLE) ×2 IMPLANT
NS IRRIG 1000ML POUR BTL (IV SOLUTION) ×2 IMPLANT
PACK MINOR (CUSTOM PROCEDURE TRAY) ×2 IMPLANT
PAD ARMBOARD 7.5X6 YLW CONV (MISCELLANEOUS) ×2 IMPLANT
SET BASIN LINEN APH (SET/KITS/TRAYS/PACK) ×2 IMPLANT
SUT SILK 2 0 SH (SUTURE) ×2 IMPLANT
SUT VIC AB 3-0 SH 27 (SUTURE) ×2
SUT VIC AB 3-0 SH 27X BRD (SUTURE) ×1 IMPLANT
SUT VIC AB 4-0 PS2 27 (SUTURE) ×2 IMPLANT
SYR CONTROL 10ML LL (SYRINGE) ×2 IMPLANT

## 2016-10-09 NOTE — Op Note (Signed)
Patient:  Amy [name]  DOB:  [birth year]  MRN:  [id number]   Preop Diagnosis:  Right breast neoplasm  Postop Diagnosis:  Same  Procedure:  Right breast biopsy after needle localization  Surgeon:  [name], M.D.  Anes:  Gen.  Indications:  Patient is a 81 year old white female who was found on recent core biopsy of the right breast to have a papilloma. She was referred by the breast center for an open biopsy. The risks and benefits of the procedure including bleeding, infection, and the possibility of malignancy were fully explained to the patient, who gave informed consent.  Procedure note:  The patient was placed in the supine position. The patient already undergone needle localization in radiology department. After induction of general endotracheal anesthesia, the right breast was prepped and draped using the usual sterile technique with DuraPrep. Surgical site confirmation was performed.  The guidewire was located at the 3:00 position of the right breast. An incision was made to include the needle. The dissection was taken down to the tip of the wire. A representative section around the wire was taken. The specimen was then sent to radiology for specimen radiography. The clip and suspicious lesion were noted within the specimen removed. The specimen was then sent to pathology for further examination. A bleeding was controlled using Bovie electrocautery. 0.5% Sensorcaine was instilled into the surrounding wound. The skin was closed using a 4-0 Vicryl subcuticular suture. Dermabond was applied.  All tape and needle counts were correct at the end the procedure. The patient was awakened and transferred to PACU in stable condition.  Complications:  None  EBL:  Minimal  Specimen:  Right breast tissue

## 2016-10-09 NOTE — Anesthesia Procedure Notes (Signed)
Procedure Name: LMA Insertion Date/Time: 10/09/2016 10:12 AM Performed by: Charmaine Downs Pre-anesthesia Checklist: Patient identified, Patient being monitored, Emergency Drugs available, Timeout performed and Suction available Patient Re-evaluated:Patient Re-evaluated prior to inductionOxygen Delivery Method: Circle System Utilized Preoxygenation: Pre-oxygenation with 100% oxygen Intubation Type: IV induction Ventilation: Mask ventilation without difficulty LMA: LMA inserted LMA Size: 4.0 Number of attempts: 1 Placement Confirmation: positive ETCO2 and breath sounds checked- equal and bilateral Tube secured with: Tape Dental Injury: Teeth and Oropharynx as per pre-operative assessment

## 2016-10-09 NOTE — Anesthesia Preprocedure Evaluation (Addendum)
Anesthesia Evaluation  Patient identified by MRN, date of birth, ID band Patient awake    Reviewed: Allergy & Precautions, NPO status , Patient's Chart, lab work & pertinent test results  Airway Mallampati: I  TM Distance: >3 FB     Dental  (+) Teeth Intact   Pulmonary former smoker,    breath sounds clear to auscultation       Cardiovascular negative cardio ROS   Rhythm:Regular Rate:Normal     Neuro/Psych PSYCHIATRIC DISORDERS Anxiety Depression    GI/Hepatic negative GI ROS,   Endo/Other  diabetes, Type 2, Oral Hypoglycemic Agents  Renal/GU      Musculoskeletal   Abdominal   Peds  Hematology   Anesthesia Other Findings   Reproductive/Obstetrics                            Anesthesia Physical Anesthesia Plan  ASA: II  Anesthesia Plan: General   Post-op Pain Management:    Induction: Intravenous  Airway Management Planned: LMA  Additional Equipment:   Intra-op Plan:   Post-operative Plan: Extubation in OR  Informed Consent: I have reviewed the patients History and Physical, chart, labs and discussed the procedure including the risks, benefits and alternatives for the proposed anesthesia with the patient or authorized representative who has indicated his/her understanding and acceptance.     Plan Discussed with:   Anesthesia Plan Comments:         Anesthesia Quick Evaluation

## 2016-10-09 NOTE — Transfer of Care (Signed)
Immediate Anesthesia Transfer of Care Note  Patient: Amy [name]  Procedure(s) Performed: Procedure(s): BREAST BIOPSY WITH NEEDLE LOCALIZATION (Right)  Patient Location: PACU  Anesthesia Type:General  Level of Consciousness: awake and patient cooperative  Airway & Oxygen Therapy: Patient Spontanous Breathing and Patient connected to face mask oxygen  Post-op Assessment: Report given to RN and Post -op Vital signs reviewed and stable  Post vital signs: Reviewed and stable  Last Vitals:  Vitals:   10/09/16 0950 10/09/16 0955  BP: (!) 144/76 (!) 157/73  Pulse:    Resp: (!) 21 (!) 39  Temp:      Last Pain: There were no vitals filed for this visit.       Complications: No apparent anesthesia complications

## 2016-10-09 NOTE — Anesthesia Postprocedure Evaluation (Signed)
Anesthesia Post Note  Patient: Amy [name]  Procedure(s) Performed: Procedure(s) (LRB): BREAST BIOPSY WITH NEEDLE LOCALIZATION (Right)  Patient location during evaluation: PACU Anesthesia Type: General Level of consciousness: awake and alert, oriented and patient cooperative Pain management: pain level controlled Vital Signs Assessment: post-procedure vital signs reviewed and stable Respiratory status: spontaneous breathing, nonlabored ventilation and respiratory function stable Cardiovascular status: blood pressure returned to baseline Postop Assessment: no signs of nausea or vomiting Anesthetic complications: no     Last Vitals:  Vitals:   10/09/16 0950 10/09/16 0955  BP: (!) 144/76 (!) 157/73  Pulse:    Resp: (!) 21 (!) 39  Temp:      Last Pain: There were no vitals filed for this visit.               Matthias Bogus J

## 2016-10-09 NOTE — Interval H&P Note (Signed)
History and Physical Interval Note:  10/09/2016 9:24 AM  Amy [name]  has presented today for surgery, with the diagnosis of right breast papilloma  The various methods of treatment have been discussed with the patient and family. After consideration of risks, benefits and other options for treatment, the patient has consented to  Procedure(s): BREAST BIOPSY WITH NEEDLE LOCALIZATION (Right) as a surgical intervention .  The patient's history has been reviewed, patient examined, no change in status, stable for surgery.  I have reviewed the patient's chart and labs.  Questions were answered to the patient's satisfaction.     [name]

## 2016-10-09 NOTE — Discharge Instructions (Signed)
Breast Biopsy, Care After  These instructions give you information about caring for yourself after your procedure. Your doctor may also give you more specific instructions. Call your doctor if you have any problems or questions after your procedure.  Follow these instructions at home:  Medicines   · Take over-the-counter and prescription medicines only as told by your doctor.  · Do not drive for 24 hours if you received a sedative.  · Do not drink alcohol while taking pain medicine.  · Do not drive or use heavy machinery while taking prescription pain medicine.  Biopsy Site Care     · Follow instructions from your doctor about how to take care of your cut from surgery (incision) or puncture area. Make sure you:  ? Wash your hands with soap and water before you change your bandage. If you cannot use soap and water, use hand sanitizer.  ? Change any bandages (dressings) as told by your doctor.  ? Leave any stitches (sutures), skin glue, or skin tape (adhesive) strips in place. They may need to stay in place for 2 weeks or longer. If tape strips get loose and curl up, you may trim the loose edges. Do not remove tape strips completely unless your doctor says it is okay.  · If you have stitches, keep them dry when you take a bath or a shower.  · Check your cut or puncture area every day for signs of infection. Check for:  ? More redness, swelling, or pain.  ? More fluid or blood.  ? Warmth.  ? Pus or a bad smell.  · Protect the biopsy area. Do not let the area get bumped.  Activity   · Avoid activities that could pull the biopsy site open.  ? Avoid stretching.  ? Avoid reaching.  ? Avoid exercise.  ? Avoid sports.  ? Avoid lifting anything that is heavier than 3 pounds (1.4 kg).  · Return to your normal activities as told by your doctor. Ask your doctor what activities are safe for you.  General instructions   · Continue your normal diet.  · Wear a good support bra for as long as told by your doctor.  · Get checked for  extra fluid in your body (lymphedema) as often as told by your doctor.  · Keep all follow-up visits as told by your doctor. This is important.  Contact a health care provider if:  · You have more redness, swelling, or pain at the biopsy site.  · You have more fluid or blood coming from your biopsy site.  · Your biopsy site feels warm to the touch.  · You have pus or a bad smell coming from the biopsy site.  · Your biopsy site breaks open after the stitches, staples, or skin tape strips have been removed.  · You have a rash.  · You have a fever.  Get help right away if:  · You have more bleeding (more than a small spot) from the biopsy site.  · You have trouble breathing.  · You have red streaks around the biopsy site.  This information is not intended to replace advice given to you by your health care provider. Make sure you discuss any questions you have with your health care provider.  Document Released: 05/11/2009 Document Revised: 03/21/2016 Document Reviewed: 04/18/2015  Elsevier Interactive Patient Education © 2017 [employer]

## 2016-10-10 DIAGNOSIS — L0100 Impetigo, unspecified: Secondary | ICD-10-CM | POA: Diagnosis not present

## 2016-10-10 DIAGNOSIS — S51811A Laceration without foreign body of right forearm, initial encounter: Secondary | ICD-10-CM | POA: Diagnosis not present

## 2016-10-11 ENCOUNTER — Encounter (HOSPITAL_COMMUNITY): Payer: Self-pay | Admitting: General Surgery

## 2016-10-15 ENCOUNTER — Encounter: Payer: Self-pay | Admitting: General Surgery

## 2016-10-15 ENCOUNTER — Ambulatory Visit (INDEPENDENT_AMBULATORY_CARE_PROVIDER_SITE_OTHER): Payer: Self-pay | Admitting: General Surgery

## 2016-10-15 VITALS — BP 143/67 | HR 86 | Temp 96.9°F | Resp 18 | Ht 64.0 in | Wt 165.0 lb

## 2016-10-15 DIAGNOSIS — Z09 Encounter for follow-up examination after completed treatment for conditions other than malignant neoplasm: Secondary | ICD-10-CM

## 2016-10-15 DIAGNOSIS — D0511 Intraductal carcinoma in situ of right breast: Secondary | ICD-10-CM

## 2016-10-15 NOTE — Patient Instructions (Signed)
Ductal Carcinoma in Situ Ductal carcinoma in situ is a growth of abnormal cells in the breast. The abnormal cells are located in the tubes that carry milk to the nipple (milk ducts) and have not spread to other areas. Ductal carcinoma in situ is the earliest form of breast cancer. What are the causes? The exact cause of ductal carcinoma in situ is not known. What increases the risk? Risk factors for ductal carcinoma in situ include:  Age. Your risk increases as you get older.  Family history of breast cancer.  Having prior radiation treatments to your breasts or chest area.  Being overweight.  Using or having used hormones, such as estrogen.  Prior history of:  Breast cancer.  Noncancerous breast conditions.  Dense breasts.  Drinking more than one alcoholic beverage per day.  Starting menstruation before the age of 69.  Never having given birth.  Giving birth to your first child when you were over the age of 24.  Not breastfeeding, if you have given birth.  Not exercising consistently. What are the signs or symptoms? Ductal carcinoma in situ does not cause any symptoms. How is this diagnosed? Ductal carcinoma in situ is usually discovered during a routine X-ray study of the breasts (mammogram). To diagnose the condition, your health care provider will take a tissue sample from your breast so it can be examined under a microscope (breast biopsy). How is this treated? Ductal carcinoma in situ treatment may include:  A lumpectomy. This is the removal of the area of abnormal cells, along with a ring of normal tissue.This may also be called breast-conserving surgery.  A simple mastectomy. This is the removal of breast tissue, the nipple, and the circle of colored tissue around the nipple (areola). Sometimes, one or more lymph nodes from under the arm are also removed.  Preventative mastectomy. This is the removal of both breasts. This is usually done only if you have a very  high risk of developing breast cancer.  Radiation. This is a treatment that uses X-rays to kill cancer cells.  Medicines to keep the cancer from spreading. Follow these instructions at home:  Take medicines only as directed by your health care provider.  Keep all follow-up visits as directed by your health care provider. This is important.  Limit alcohol intake to no more than 1 drink per day for nonpregnant women. One drink equals 12 ounces of beer, 5 ounces of wine, or 1 ounces of hard liquor. Contact a health care provider if: You have a fever. Get help right away if: You have difficulty breathing. This information is not intended to replace advice given to you by your health care provider. Make sure you discuss any questions you have with your health care provider. Document Released: 02/09/2014 Document Revised: 12/21/2015 Document Reviewed: 12/16/2013 Elsevier Interactive Patient Education  2017 [employer].

## 2016-10-15 NOTE — Progress Notes (Signed)
Subjective:     Amy [name]  Status post right breast biopsy after needle localization. Patient denies any problems. Objective:    BP (!) 143/67   Pulse 86   Temp (!) 96.9 F (36.1 C)   Resp 18   Ht 5\' 4"  (1.626 m)   Wt 165 lb (74.8 kg)   BMI 28.32 kg/m   General:  alert, cooperative and no distress  Right breast incision healing well. Ecchymosis resolving. No hematoma.     Assessment:    Doing well postoperatively.    Plan:  Patient has been told that final diagnosis was DCIS of the right breast. Will refer patient to oncology for further management treatment. I did tell her options down the road included a right simple mastectomy versus radiation therapy. She would like to wait and talk to the oncologist first.

## 2016-10-17 DIAGNOSIS — L0100 Impetigo, unspecified: Secondary | ICD-10-CM | POA: Diagnosis not present

## 2016-10-17 DIAGNOSIS — L280 Lichen simplex chronicus: Secondary | ICD-10-CM | POA: Diagnosis not present

## 2016-10-22 ENCOUNTER — Encounter (HOSPITAL_COMMUNITY): Payer: Self-pay | Admitting: Psychiatry

## 2016-10-22 ENCOUNTER — Ambulatory Visit (INDEPENDENT_AMBULATORY_CARE_PROVIDER_SITE_OTHER): Payer: Medicare HMO | Admitting: Psychiatry

## 2016-10-22 ENCOUNTER — Telehealth (HOSPITAL_COMMUNITY): Payer: Self-pay | Admitting: *Deleted

## 2016-10-22 VITALS — BP 162/90 | HR 88 | Ht 64.0 in | Wt 163.0 lb

## 2016-10-22 DIAGNOSIS — Z79899 Other long term (current) drug therapy: Secondary | ICD-10-CM

## 2016-10-22 DIAGNOSIS — R69 Illness, unspecified: Secondary | ICD-10-CM | POA: Diagnosis not present

## 2016-10-22 DIAGNOSIS — F418 Other specified anxiety disorders: Secondary | ICD-10-CM

## 2016-10-22 DIAGNOSIS — F332 Major depressive disorder, recurrent severe without psychotic features: Secondary | ICD-10-CM

## 2016-10-22 MED ORDER — RISPERIDONE 0.5 MG PO TABS: 0.5000 mg | ORAL_TABLET | Freq: Every day | ORAL | 2 refills | Status: DC

## 2016-10-22 MED ORDER — ESCITALOPRAM OXALATE 20 MG PO TABS: 20.0000 mg | ORAL_TABLET | Freq: Every day | ORAL | 2 refills | Status: DC

## 2016-10-22 MED ORDER — VENLAFAXINE HCL ER 75 MG PO CP24: ORAL_CAPSULE | ORAL | 2 refills | Status: DC

## 2016-10-22 MED ORDER — ALPRAZOLAM 0.5 MG PO TABS: 0.5000 mg | ORAL_TABLET | Freq: Three times a day (TID) | ORAL | 2 refills | Status: DC | PRN
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
## 2016-10-22 NOTE — Telephone Encounter (Signed)
Prior authorization for Venlafaxine ER received. Submitted online with cover my meds.

## 2016-10-23 ENCOUNTER — Encounter (HOSPITAL_COMMUNITY): Payer: Self-pay | Admitting: Oncology

## 2016-10-23 ENCOUNTER — Encounter (HOSPITAL_COMMUNITY): Payer: Medicare HMO | Attending: Oncology | Admitting: Oncology

## 2016-10-23 VITALS — BP 131/59 | HR 90 | Resp 20 | Ht 64.0 in | Wt 160.0 lb

## 2016-10-23 DIAGNOSIS — Z87891 Personal history of nicotine dependence: Secondary | ICD-10-CM

## 2016-10-23 DIAGNOSIS — Z800 Family history of malignant neoplasm of digestive organs: Secondary | ICD-10-CM | POA: Diagnosis not present

## 2016-10-23 DIAGNOSIS — F418 Other specified anxiety disorders: Secondary | ICD-10-CM

## 2016-10-23 DIAGNOSIS — C50911 Malignant neoplasm of unspecified site of right female breast: Secondary | ICD-10-CM | POA: Diagnosis not present

## 2016-10-23 DIAGNOSIS — Z808 Family history of malignant neoplasm of other organs or systems: Secondary | ICD-10-CM

## 2016-10-23 DIAGNOSIS — E119 Type 2 diabetes mellitus without complications: Secondary | ICD-10-CM

## 2016-10-23 DIAGNOSIS — Z801 Family history of malignant neoplasm of trachea, bronchus and lung: Secondary | ICD-10-CM | POA: Diagnosis not present

## 2016-10-23 DIAGNOSIS — R634 Abnormal weight loss: Secondary | ICD-10-CM

## 2016-10-23 DIAGNOSIS — R69 Illness, unspecified: Secondary | ICD-10-CM | POA: Diagnosis not present

## 2016-10-23 DIAGNOSIS — G4700 Insomnia, unspecified: Secondary | ICD-10-CM | POA: Diagnosis not present

## 2016-10-23 DIAGNOSIS — M8588 Other specified disorders of bone density and structure, other site: Secondary | ICD-10-CM

## 2016-10-23 DIAGNOSIS — M8580 Other specified disorders of bone density and structure, unspecified site: Secondary | ICD-10-CM

## 2016-10-23 DIAGNOSIS — Z803 Family history of malignant neoplasm of breast: Secondary | ICD-10-CM | POA: Diagnosis not present

## 2016-10-23 DIAGNOSIS — Z809 Family history of malignant neoplasm, unspecified: Secondary | ICD-10-CM

## 2016-10-23 LAB — CBC WITH DIFFERENTIAL/PLATELET
Basophils Absolute: 0 10*3/uL (ref 0.0–0.1)
Basophils Relative: 0 %
Eosinophils Absolute: 0.2 10*3/uL (ref 0.0–0.7)
Eosinophils Relative: 3 %
HCT: 37.9 % (ref 36.0–46.0)
Hemoglobin: 12.5 g/dL (ref 12.0–15.0)
Lymphocytes Relative: 29 %
Lymphs Abs: 1.9 10*3/uL (ref 0.7–4.0)
MCH: 30.7 pg (ref 26.0–34.0)
MCHC: 33 g/dL (ref 30.0–36.0)
MCV: 93.1 fL (ref 78.0–100.0)
Monocytes Absolute: 0.5 10*3/uL (ref 0.1–1.0)
Monocytes Relative: 7 %
Neutro Abs: 4 10*3/uL (ref 1.7–7.7)
Neutrophils Relative %: 61 %
Platelets: 282 10*3/uL (ref 150–400)
RBC: 4.07 MIL/uL (ref 3.87–5.11)
RDW: 13.3 % (ref 11.5–15.5)
WBC: 6.6 10*3/uL (ref 4.0–10.5)

## 2016-10-23 LAB — COMPREHENSIVE METABOLIC PANEL
ALT: 11 U/L — ABNORMAL LOW (ref 14–54)
AST: 15 U/L (ref 15–41)
Albumin: 4.2 g/dL (ref 3.5–5.0)
Alkaline Phosphatase: 58 U/L (ref 38–126)
Anion gap: 5 (ref 5–15)
BUN: 21 mg/dL — ABNORMAL HIGH (ref 6–20)
CO2: 26 mmol/L (ref 22–32)
Calcium: 8.8 mg/dL — ABNORMAL LOW (ref 8.9–10.3)
Chloride: 102 mmol/L (ref 101–111)
Creatinine, Ser: 0.99 mg/dL (ref 0.44–1.00)
GFR calc Af Amer: >60 mL/min (ref >60)
GFR calc non Af Amer: 53 mL/min — ABNORMAL LOW (ref >60)
Glucose, Bld: 93 mg/dL (ref 65–99)
Potassium: 4.3 mmol/L (ref 3.5–5.1)
Sodium: 133 mmol/L — ABNORMAL LOW (ref 135–145)
Total Bilirubin: 0.4 mg/dL (ref 0.3–1.2)
Total Protein: 6.8 g/dL (ref 6.5–8.1)

## 2016-10-23 MED ORDER — VITAMIN D 1000 UNITS PO TABS: 1000.0000 [IU] | ORAL_TABLET | Freq: Every day | ORAL | 11 refills | Status: DC

## 2016-10-23 MED ORDER — CALCIUM 600 MG PO TABS: 600.0000 mg | ORAL_TABLET | Freq: Two times a day (BID) | ORAL | 11 refills | Status: DC

## 2016-10-23 MED ORDER — ANASTROZOLE 1 MG PO TABS: 1.0000 mg | ORAL_TABLET | Freq: Every day | ORAL | 2 refills | Status: DC

## 2016-10-23 NOTE — Progress Notes (Signed)
[facility] Hematology/Oncology Consultation   Name: Amy [name]      MRN: [id number]    Location: Room/bed info not found  Date: 10/23/2016 Time:6:45 PM   REFERRING PHYSICIAN:  [name], MD (Gen Surg)  REASON FOR CONSULT: Invasive ductal carcinoma of right breast.  Invasive ductal carcinoma of right breast,   DIAGNOSIS:  Invasive ductal carcinoma right breast, Stage IA (pT1aN0M0), ER/PR positive, HER-2 negative, S/P lumpectomy by Dr. Arnoldo Morale on 10/09/2016.  HISTORY OF PRESENT ILLNESS:   Amy [name] is a 81 y.o. female with a medical history significant for depression with anxiety, insomnia secondary to depression, lumbago, diabetes, joint pain who is referred to the [facility] for Invasive ductal carcinoma right breast, Stage IA (pT1aN0M0), ER/PR positive, HER-2 negative.    Invasive ductal carcinoma of breast, female, right ([place])   10/09/2016 Procedure    Left breast lumpectomy by Dr. Arnoldo Morale      10/16/2016 Pathology Results    Invasive, grade 1, ductal carcinoma, spanning 0.18 cmIn greatest dimension.  Low and intermediate grade DCIS with associated calcifications.  Intraductal papilloma formation with associated low-grade DCIS.  DCIS is less than 0.1 cm from the margin in multiple areas including intermediate ductal carcinoma in situ adjacent to cauterized edge.       10/16/2016 Pathology Results    ER +100%, PR +90%, HER-2 negative.      10/23/2016 Cancer Staging    Cancer Staging Invasive ductal carcinoma of breast, female, right [facility]) Staging form: Breast, AJCC 8th Edition - Pathologic stage from 10/23/2016: Stage Unknown (pT1a, pNX, cM0, G1, ER: Positive, PR: Positive, HER2: Negative) - Signed by Baird Cancer, PA-C on 10/23/2016       The patient reports a history of right sided papilloma in the past requiring resection.  She notes that she underwent a screening mammogram and abnormality was noted at that time.  Biopsy was  performed at [facility] and demonstrated papilloma.  It was recommended that she undergo resection of this and therefore she was referred to Dr. Arnoldo Morale for consideration of lumpectomy.  She underwent lumpectomy on 10/09/2016 and this revealed a very small invasive component of disease with DCIS.  Margins were clear.  She is here for medical oncology recommendations moving forward.  She denies any oncology complaints.  She continues to recover from her left sided lumpectomy.  She notes ongoing tenderness.  She notices ecchymoses as well.  She denies any drainage from the incision site.  She denies any fevers or chills.  She does report a 30 pound weight loss that is unintentional over the past 1 year.  She notes a diagnosis of osteopenia in the past with intolerance to 3 separate types of bisphosphonate therapy orally.  She is not currently on any therapy for her bones.  Review of Systems  Constitutional: Negative.  Negative for chills, fever and weight loss.  HENT: Negative.   Eyes: Negative.   Respiratory: Negative.  Negative for cough.   Cardiovascular: Negative.  Negative for chest pain.  Gastrointestinal: Negative.  Negative for blood in stool, constipation, diarrhea, melena, nausea and vomiting.  Genitourinary: Negative.   Musculoskeletal: Positive for joint pain (chronic).  Skin: Negative.   Neurological: Negative.  Negative for weakness.  Endo/Heme/Allergies: Negative.   Psychiatric/Behavioral: The patient is nervous/anxious.      PAST MEDICAL HISTORY:   Past Medical History:  Diagnosis Date  . Anxiety   . Arthritis   .  Back pain   . Colon polyps   . Depression   . Diabetes mellitus, type II ([place])    diet controlled  . Hypercholesterolemia   . Invasive ductal carcinoma of breast, female, right ([place])   . Skin-picking disorder    has 3 open wounds on right wrist that are being treated. About a quarter in size.    ALLERGIES: Allergies  Allergen Reactions  . Cymbalta  [Duloxetine Hcl] Other (See Comments)    Hallucinated and couldn't think that got worse and worse the longer she took this.      MEDICATIONS: I have reviewed the patient's current medications.    Current Outpatient Prescriptions on File Prior to Visit  Medication Sig Dispense Refill  . ALPRAZolam (XANAX) 0.5 MG tablet Take 1 tablet (0.5 mg total) by mouth 3 (three) times daily as needed for anxiety. 90 tablet 2  . cyanocobalamin 1000 MCG tablet Take 1,000 mcg by mouth daily.    Marland Kitchen escitalopram (LEXAPRO) 20 MG tablet Take 1 tablet (20 mg total) by mouth daily. 30 tablet 2  . meloxicam (MOBIC) 15 MG tablet Take 15 mg by mouth daily as needed for pain.    . metFORMIN (GLUCOPHAGE) 500 MG tablet Take 500 mg by mouth 2 (two) times daily with a meal.     . oxyCODONE-acetaminophen (PERCOCET/ROXICET) 5-325 MG tablet Take 1 tablet by mouth every 6 (six) hours as needed for moderate pain or severe pain. 30 tablet 0  . risperiDONE (RISPERDAL) 0.5 MG tablet Take 1 tablet (0.5 mg total) by mouth at bedtime. 30 tablet 2  . valACYclovir (VALTREX) 500 MG tablet Take 1,000 mg by mouth See admin instructions. During outbreak - pt takes 2 tabs for 3 days     . venlafaxine XR (EFFEXOR XR) 75 MG 24 hr capsule Take one in the am and two at night 90 capsule 2   No current facility-administered medications on file prior to visit.      PAST SURGICAL HISTORY Past Surgical History:  Procedure Laterality Date  . APPENDECTOMY    . BREAST BIOPSY Right 10/09/2016   Procedure: BREAST BIOPSY WITH NEEDLE LOCALIZATION;  Surgeon: [name], MD;  Location: [facility];  Service: General;  Laterality: Right;  . BREAST SURGERY Right   . BUNIONECTOMY Bilateral   . FACIAL COSMETIC SURGERY     drooping eyelid and brow  . HERNIA REPAIR Right    Inguinal x2  . KNEE ARTHROSCOPY Right   . SHOULDER SURGERY Right    rotator cuff repair and removal of bone spur  . TEMPOROMANDIBULAR JOINT SURGERY Right     FAMILY HISTORY: Family  History  Problem Relation Age of Onset  . Anxiety disorder Mother   . Depression Mother   . Depression Maternal Grandmother   . Alcohol abuse Brother   . Cancer - Other Sister   . ADD / ADHD Neg Hx   . Bipolar disorder Neg Hx   . Dementia Neg Hx   . Drug abuse Neg Hx   . OCD Neg Hx   . Paranoid behavior Neg Hx   . Schizophrenia Neg Hx   . Seizures Neg Hx   . Sexual abuse Neg Hx   . Physical abuse Neg Hx    Mother deceased at the age of 57 secondary to colon cancer.  She was a chewing tobacco user. Father passed away at the age of 48 secondary to lung cancer; he was a smoker. She has 2 adopted children in her  58s. She has 1 living sister. She has one brother who passed away as a toddler secondary to pneumonia She has 1 sister who passed away in her 68s from breast cancer. One brother passed away in his 16s secondary to head and neck cancer. Maternal grandparents both deceased from cancer of unknown kind.  SOCIAL HISTORY:  reports that she quit smoking about 21 years ago. Her smoking use included Cigarettes. She has a 20.00 pack-year smoking history. She has never used smokeless tobacco. She reports that she does not drink alcohol or use drugs.  She is Psychologist, forensic and religion.  She is retired from [employer] as a Marine scientist.  She is divorced.  She has no biological children.  Social History   Social History  . Marital status: Divorced    Spouse name: N/A  . Number of children: N/A  . Years of education: N/A   Social History Main Topics  . Smoking status: Former Smoker    Packs/day: 0.50    Years: 40.00    Types: Cigarettes    Quit date: 09/04/1995  . Smokeless tobacco: Never Used  . Alcohol use No  . Drug use: No  . Sexual activity: No   Other Topics Concern  . None   Social History Narrative  . None    PERFORMANCE STATUS: The patient's performance status is 1 - Symptomatic but completely ambulatory  PHYSICAL EXAM: Most Recent Vital Signs: Blood  pressure (!) 131/59, pulse 90, resp. rate 20, height '5\' 4"'$  (1.626 m), weight 160 lb (72.6 kg), SpO2 99 %. BP (!) 131/59 (BP Location: Left Arm, Patient Position: Sitting)   Pulse 90   Resp 20   Ht '5\' 4"'$  (1.626 m)   Wt 160 lb (72.6 kg)   SpO2 99%   BMI 27.46 kg/m   General Appearance:    Alert, cooperative, no distress, appears stated age, unaccompanied, difficult to keep on topic during discussion.  Head:    Normocephalic, without obvious abnormality, atraumatic  Eyes:    Conjunctiva/corneas clear, EOM's intact, benign, both eyes  Ears:    Normal TM's and external ear canals, both ears  Nose:   Nares normal, septum midline, mucosa normal, no drainage    or sinus tenderness  Throat:   Lips, mucosa, and tongue normal; teeth and gums normal  Neck:   Supple, symmetrical, trachea midline, no adenopathy.  Back:     Symmetric, no curvature, ROM normal, no CVA tenderness  Lungs:     Clear to auscultation bilaterally, respirations unlabored  Chest Wall:    No tenderness or deformity   Heart:    Regular rate and rhythm, S1 and S2 normal, no murmur, rub   or gallop  Breast Exam:    Left breast without any palpable abnormality or nipple inversion but with a inner lower quadrant ecchymosis.  Right breast with surgical site without any drainage or discharge but widespread ecchymosis in the outer lower quadrant and tenderness to light palpation without erythema.  Abdomen:     Soft, non-tender, bowel sounds active all four quadrants,    no masses, no organomegaly  Genitalia:    Not examined  Rectal:    Not examined  Extremities:   Extremities normal, atraumatic, no cyanosis or edema  Pulses:   2+ and symmetric all extremities  Skin:   Skin color, texture, turgor normal, no rashes or lesions  Lymph nodes:   Cervical, supraclavicular, and axillary nodes normal  Neurologic:   CNII-XII intact, normal  strength, sensation and reflexes    throughout    LABORATORY DATA:  CBC    Component Value Date/Time    WBC 6.6 10/23/2016 1618   RBC 4.07 10/23/2016 1618   HGB 12.5 10/23/2016 1618   HCT 37.9 10/23/2016 1618   PLT 282 10/23/2016 1618   MCV 93.1 10/23/2016 1618   MCH 30.7 10/23/2016 1618   MCHC 33.0 10/23/2016 1618   RDW 13.3 10/23/2016 1618   LYMPHSABS 1.9 10/23/2016 1618   MONOABS 0.5 10/23/2016 1618   EOSABS 0.2 10/23/2016 1618   BASOSABS 0.0 10/23/2016 1618     Chemistry      Component Value Date/Time   NA 133 (L) 10/23/2016 1618   K 4.3 10/23/2016 1618   CL 102 10/23/2016 1618   CO2 26 10/23/2016 1618   BUN 21 (H) 10/23/2016 1618   CREATININE 0.99 10/23/2016 1618      Component Value Date/Time   CALCIUM 8.8 (L) 10/23/2016 1618   ALKPHOS 58 10/23/2016 1618   AST 15 10/23/2016 1618   ALT 11 (L) 10/23/2016 1618   BILITOT 0.4 10/23/2016 1618       RADIOGRAPHY: No results found.     PATHOLOGY:    REASON FOR ADDENDUM, AMENDMENT OR CORRECTION: [id number]: AMENDMENT NOTE: Previously reported as: - LOW AND INTERMEDIATE GRADE DUCTAL CARCINOMA IN SITU WITH ASSOCIATED CALCIFICATIONS. - INTRADUCTAL PAPILLOMA FORMATION WITH ASSOCIATED LOW GRADE DUCTAL CARCINOMA IN SITU. - DUCTAL CARCINOMA IN SITU IS LESS THAN 0.1 CM FROM THE MARGIN IN MULTIPLE AREAS INCLUDING INTERMEDIATE DUCTAL CARCINOMA IN SITU ADJACENT TO CAUTERIZED EDGE. - SEE ONCOLOGY TEMPLATE. AMENDMENT REASON: On deeper levels cut for the quantitative estrogen receptor and progesterone receptor studies, an additional deeper level H&E slide was also cut and examined. On this additional H&E slide there is a small focus of invasive grade 1 ductal carcinoma measuring 0.18 cm in greatest dimension. The focus is away from the margins. The presence of tumor is confirmed with immunohistochemical stains. The diagnosis is updated to include this additional finding and the oncology template is replaced with an invasive carcinoma oncology template (the comment is also updated). The findings are called to Dr. Franky Macho  on 10/16/2016. ATTENTION: Revised diagnosis report. Previous sign out date: 10/24/2016 (initials: RAH / ecj date: 10/16/2016) ADDITIONAL INFORMATION: FLUORESCENCE IN-SITU HYBRIDIZATION Results: HER2 - NEGATIVE RATIO OF HER2/CEP17 SIGNALS 1.38 AVERAGE HER2 COPY NUMBER PER CELL 2.35 Reference Range: NEGATIVE HER2/CEP17 Ratio <2.0 and average HER2 copy number <4.0 EQUIVOCAL HER2/CEP17 Ratio <2.0 and average HER2 copy number >=4.0 and <6.0 POSITIVE HER2/CEP17 Ratio >=2.0 or <2.0 and average HER2 copy number >=6.0 Valinda Hoar MD 1 of 4 Amended copy Amended FINAL for SURY, WENTWORTH ([id number]) ADDITIONAL INFORMATION:(continued) Sports administrator, Electronic Signature ( Signed 10/21/2016) PROGNOSTIC INDICATORS Results: IMMUNOHISTOCHEMICAL AND MORPHOMETRIC ANALYSIS PERFORMED MANUALLY Estrogen Receptor: 100%, POSITIVE, STRONG STAINING INTENSITY Progesterone Receptor: 90%, POSITIVE, STRONG STAINING INTENSITY Proliferation Marker Ki67: 1% REFERENCE RANGE ESTROGEN RECEPTOR NEGATIVE 0% POSITIVE =>1% REFERENCE RANGE PROGESTERONE RECEPTOR NEGATIVE 0% POSITIVE =>1% All controls stained appropriately Valinda Hoar MD Pathologist, Electronic Signature ( Signed 10/17/2016) FINAL DIAGNOSIS Diagnosis Breast, lumpectomy, right - INVASIVE GRADE 1 DUCTAL CARCINOMA, SPANNING 0.18 CM IN GREATEST DIMENSION. - LOW AND INTERMEDIATE GRADE DUCTAL CARCINOMA IN SITU WITH ASSOCIATED CALCIFICATIONS. - INTRADUCTAL PAPILLOMA FORMATION WITH ASSOCIATED LOW GRADE DUCTAL CARCINOMA IN SITU. - DUCTAL CARCINOMA IN SITU IS LESS THAN 0.1 CM FROM THE MARGIN IN MULTIPLE AREAS INCLUDING INTERMEDIATE DUCTAL CARCINOMA IN SITU ADJACENT TO CAUTERIZED EDGE. - SEE ONCOLOGY TEMPLATE. Microscopic  Comment BREAST, INVASIVE TUMOR Procedure: Right breast lumpectomy. Laterality: Right. Tumor Size: 0.18 cm. Histologic Type: Invasive ductal carcinoma. Grade: 1. Tubular Differentiation: 1. Nuclear Pleomorphism: 1. Mitotic Count:  1. Ductal Carcinoma in Situ (DCIS): Yes, low and intermediate grade ductal carcinoma in situ with calcifications is present. Extent of Tumor: Invasive and in situ tumor is present in breast tissue. 2 of 4 Amended copy Amended FINAL for NIKERIA, KALMAN ([id number]) Microscopic Comment(continued) Skin: Not received. Nipple: Not received. Skeletal muscle: Not received. Margins: Invasive carcinoma, distance from closest margin: Greater than 0.4 cm from the margin. DCIS, distance from closest margin: Ductal carcinoma in situ is less than 0.1 cm from the margin in multiple areas. Regional Lymph Nodes: Number of Lymph Nodes Examined: 0. Number of Sentinel Lymph Nodes Examined: 0. Lymph Nodes with Macrometastases: No lymph nodes examined. Lymph Nodes with Micrometastases: No lymph nodes examined. Lymph Nodes with Isolated Tumor Cells: No lymph nodes examined. Breast Prognostic Profile: Estrogen Receptor: Will be performed on current invasive tumor. Progesterone Receptor: Will be performed on current tumor. Her-2 FISH: Will be performed on current tumor. Ki-67: Will be performed on current tumor. Pathologic Stage Classification (pTNM, AJCC 8th Edition): Primary Tumor (pT): pT1a. Regional Lymph Nodes (pN): pNX. Comments: Immunohistochemical stains are performed for p63, calponin and smooth muscle myosin on deeper levels of block 1C. The stains confirm a small focus of invasive ductal carcinoma. Dr. Lyndon Code has seen the slides from block 1C, including deeper levels and immunohistochemical stained slides, with agreement that there is a 0.18 cm focus of invasive grade 1 ductal carcinoma present. Dr. Lanae Crumbly has seen the original slides of the case with agreement that the findings on the original slides represent low and intermediate grade ductal carcinoma in situ with associated calcifications; intraductal papilloma formation with associated low grade ductal carcinoma in situ; and that  ductal carcinoma in situ is less than 0.1 cm from the margin in multiple areas (including intermediate grade ductal carcinoma in situ adjacent to the cauterized edge). (RH:ecj 10/16/2016) Willeen Niece MD Pathologist, Electronic Signature (Case signed 10/14/2016) Corrected Report Signer Willeen Niece MD Pathologist, Electronic Signature (Case signed 10/16/2016)  ASSESSMENT/PLAN:   Invasive ductal carcinoma of breast, female, right ([place]) Invasive ductal carcinoma right breast, Stage IA (pT1aN0M0), ER/PR positive, HER-2 negative, S/P lumpectomy by Dr. Arnoldo Morale on 10/09/2016. AND Previous Dx of osteoporosis complicated by intolerance to oral bisphosphonate therapy x 3.  Oncology history developed.  Staging in [facility] problem list completed.  I personally reviewed and went over laboratory results with the patient.  The results are noted within this dictation.  I personally reviewed and went over radiographic studies with the patient.  The results are noted within this dictation.    I personally reviewed and went over pathology results with the patient.  I have reviewed her staging and invasive breast cancer.  I have reviewed the NCCN guidelines pertaining to treatment options moving forward.  She is provided a copy of this as well for her own record.  She is also provided education for her own review at home.  She is given an early stage breast cancer book.  We discussed treatment options moving forward.  She is a candidate for aromatase inhibitor.  She is post-menopausal.  I have reviewed the options of different aromatase inhibitors.  I reviewed the risks, benefits, alternatives, and side effects of this therapy, including, but not limited to, hot flashes, arthralgias, myalgias, and increased risk of osteoporosis.  She will need a baseline bone  density exam.  Order is placed.  Orders are placed for baseline labs today: CBC diff, CMET.  Rx is provided for the patient for Arimidex daily.   I have reviewed the risks, benefits, alternatives, and side effects of Arimidex therapy including, but not limited to, hot flashes, arthralgias, myalgias, and increased risk for osteoporosis.  She is provided a prescription for 1200 mg of calcium and 1000 units of vitamin D to be taken daily.  Due to her age being greater than 89, and stage IA disease, she is not a candidate for radiation therapy as there is no benefit.   Due to her unintentional weight loss in the setting of a newly diagnosed breast cancer, orders are placed for CT CAP w contrast to rule out occult malignancy.  She will return in 4-5 weeks for follow-up and tolerability check with labs.     ORDERS PLACED FOR THIS ENCOUNTER: Orders Placed This Encounter  Procedures  . DG Bone Density  . CT Abdomen Pelvis W Contrast  . CT Chest W Contrast  . CBC with Differential  . Comprehensive metabolic panel  . CBC with Differential  . Comprehensive metabolic panel  . Ambulatory referral to Social Work    MEDICATIONS PRESCRIBED THIS ENCOUNTER: Meds ordered this encounter  Medications  . calcium carbonate (OS-CAL) 600 MG tablet    Sig: Take 1 tablet (600 mg total) by mouth 2 (two) times daily with a meal.    Dispense:  60 tablet    Refill:  11    Order Specific Question:   Supervising Provider    Answer:   Brunetta Genera [[id number]]  . cholecalciferol (VITAMIN D) 1000 units tablet    Sig: Take 1 tablet (1,000 Units total) by mouth daily.    Dispense:  30 tablet    Refill:  11    Order Specific Question:   Supervising Provider    Answer:   Brunetta Genera [[id number]]  . anastrozole (ARIMIDEX) 1 MG tablet    Sig: Take 1 tablet (1 mg total) by mouth daily.    Dispense:  30 tablet    Refill:  2    Order Specific Question:   Supervising Provider    Answer:   Brunetta Genera [[id number]]    All questions were answered. The patient knows to call the clinic with any problems, questions or concerns. We can certainly  see the patient much sooner if necessary.  Patient discussed with Dr. Talbert Cage and together we ascertained an up-to-date interval history, and examined the patient.  Dr. Talbert Cage developed the patient's assessment and plan.  This was a shared visit-consultation.  Her attestation will follow below.  This note is electronically signed by: Doy Mince 10/23/2016 6:45 PM

## 2016-10-23 NOTE — Patient Instructions (Addendum)
[place] at [facility] Med Discharge Instructions  RECOMMENDATIONS MADE BY THE CONSULTANT AND ANY TEST RESULTS WILL BE SENT TO YOUR REFERRING PHYSICIAN.  You were seen today by Kirby Crigler PA-C and Dr. Talbert Cage. CT scan and Bone Density. Labs today, we will call you with results. Start taking Arimidex tomorrow. Start taking Calcium and Vit D. Return in 5 weeks for labs and follow up.    Thank you for choosing [place] at [facility] to provide your oncology and hematology care.  To afford each patient quality time with our provider, please arrive at least 15 minutes before your scheduled appointment time.    If you have a lab appointment with the [facility] please come in thru the  Main Entrance and check in at the main information desk  You need to re-schedule your appointment should you arrive 10 or more minutes late.  We strive to give you quality time with our providers, and arriving late affects you and other patients whose appointments are after yours.  Also, if you no show three or more times for appointments you may be dismissed from the clinic at the providers discretion.     Again, thank you for choosing [facility].  Our hope is that these requests will decrease the amount of time that you wait before being seen by our physicians.       _____________________________________________________________  Should you have questions after your visit to [facility], please contact our office at [phone] between the hours of 8:30 a.m. and 4:30 p.m.  Voicemails left after 4:30 p.m. will not be returned until the following business day.  For prescription refill requests, have your pharmacy contact our office.       Resources For Cancer Patients and their Caregivers ? American Cancer Society: Can assist with transportation, wigs, general needs, runs Look Good Feel Better.        646 012 9847 ? Cancer  Care: Provides financial assistance, online support groups, medication/co-pay assistance.  1-800-813-HOPE 726-410-9940) ? [place] Assists [place] cancer patients and their families through emotional , educational and financial support.  540-437-7479 ? [place] DSS Where to apply for food stamps, Medicaid and utility assistance. 319-832-1876 ? [facility]: Transportation to medical appointments. 352-157-0599 ? Social Security Administration: May apply for disability if have a Stage IV cancer. 813-474-0264 (781)073-8779 ? [employer], Disability and Transit Services: Assists with nutrition, care and transit needs. [place] Support Programs: @10RELATIVEDAYS @ > Cancer Support Group  2nd Tuesday of the month 1pm-2pm, Journey Room  > Creative Journey  3rd Tuesday of the month 1130am-1pm, Journey Room  > Look Good Feel Better  1st Wednesday of the month 10am-12 noon, Journey Room (Call [place] to register [phone])

## 2016-10-23 NOTE — Assessment & Plan Note (Addendum)
Invasive ductal carcinoma right breast, Stage IA ([id number]), ER/PR positive, HER-2 negative, S/P lumpectomy by Dr. Arnoldo Morale on 10/09/2016. AND Previous Dx of osteoporosis complicated by intolerance to oral bisphosphonate therapy x 3.  Oncology history developed.  Staging in [facility] problem list completed.  I personally reviewed and went over laboratory results with the patient.  The results are noted within this dictation.  I personally reviewed and went over radiographic studies with the patient.  The results are noted within this dictation.    I personally reviewed and went over pathology results with the patient.  I have reviewed her staging and invasive breast cancer.  I have reviewed the NCCN guidelines pertaining to treatment options moving forward.  She is provided a copy of this as well for her own record.  She is also provided education for her own review at home.  She is given an early stage breast cancer book.  We discussed treatment options moving forward.  She is a candidate for aromatase inhibitor.  She is post-menopausal.  I have reviewed the options of different aromatase inhibitors.  I reviewed the risks, benefits, alternatives, and side effects of this therapy, including, but not limited to, hot flashes, arthralgias, myalgias, and increased risk of osteoporosis.  She will need a baseline bone density exam.  Order is placed.  Orders are placed for baseline labs today: CBC diff, CMET.  Rx is provided for the patient for Arimidex daily.  I have reviewed the risks, benefits, alternatives, and side effects of Arimidex therapy including, but not limited to, hot flashes, arthralgias, myalgias, and increased risk for osteoporosis.  She is provided a prescription for 1200 mg of calcium and 1000 units of vitamin D to be taken daily.  Due to her age being greater than 24, and stage IA disease, she is not a candidate for radiation therapy as there is no benefit.   Due to her unintentional  weight loss in the setting of a newly diagnosed breast cancer, orders are placed for CT CAP w contrast to rule out occult malignancy.  She will return in 4-5 weeks for follow-up and tolerability check with labs.

## 2016-10-23 NOTE — Telephone Encounter (Signed)
Office received fax from [place] stated this medication does not require PA

## 2016-10-24 ENCOUNTER — Telehealth (HOSPITAL_COMMUNITY): Payer: Self-pay | Admitting: *Deleted

## 2016-10-24 NOTE — Telephone Encounter (Signed)
Received fax from [place] stating that prior authorization is not required for Venlafaxine

## 2016-10-28 ENCOUNTER — Telehealth (HOSPITAL_COMMUNITY): Payer: Self-pay

## 2016-10-28 NOTE — Telephone Encounter (Signed)
Patient called stating since she started the Arimidex last week she has been having cramps in her joints from her waist down. Reviewed with PA-C. Called patient back and left message instructing her to try water with quinine. If that didn't help to call us back.

## 2016-10-29 ENCOUNTER — Encounter: Payer: Self-pay | Admitting: *Deleted

## 2016-10-29 ENCOUNTER — Other Ambulatory Visit (HOSPITAL_COMMUNITY): Payer: Self-pay | Admitting: Oncology

## 2016-10-29 DIAGNOSIS — M5442 Lumbago with sciatica, left side: Secondary | ICD-10-CM

## 2016-10-29 DIAGNOSIS — R52 Pain, unspecified: Secondary | ICD-10-CM

## 2016-10-29 MED ORDER — MELOXICAM 15 MG PO TABS: 15.0000 mg | ORAL_TABLET | Freq: Every day | ORAL | 0 refills | Status: DC | PRN

## 2016-10-29 NOTE — Telephone Encounter (Signed)
Spoke with patient this am. She states she is still extremely uncomfortable and having difficulty walking especially the left leg. She did not know what to do with the the water and quinine. She states she was unsure if she was supposed to drink the quinine water, bathe in it or rub it on the painful areas. She states she has pain in her left buttock also. I explained that the pain she is describing is more like sciatica pain. She states she has percocet that helps for a little while. She is out of Meloxicam. She sees an orthopedic doctor on Friday. She states she needs something to help her get through the week until she sees ortho. Explained that I would review with the PA again and call her back.

## 2016-10-29 NOTE — Telephone Encounter (Signed)
Called patient and left message that Mobic had been sent to her pharmacy to help her make it to her appts. This week. Further refills will need to come from PCP or Ortho. Call if any further needs.

## 2016-10-29 NOTE — Patient Instructions (Signed)
[place] Psychosocial Distress Screening Clinical Social Work  Clinical Social Work was referred by distress screening protocol.  The patient scored a 8 on the Psychosocial Distress Thermometer which indicates severe distress. Clinical Social Worker reviewed chart and contacted pt via phone to assess for distress and other psychosocial needs. CSW introduced self, explained role of CSW/Pt and Family Support Team, support groups and other resources to assist. Pt reports she is most concerned about her knee concerns and pain related to that. She continues to be seen by [facility] and is a pt of Dr [name]. She reports her medications are well managed through there for mental health concerns. Pt very interested in support group and plans to attend. Pt aware to reach out as needed and CSW reviewed transportation resources to assist as well. CSW to follow.    ONCBCN DISTRESS SCREENING 10/23/2016  Screening Type Initial Screening  Distress experienced in past week (1-10) 8  Family Problem type Other (comment)  Emotional problem type Depression;Adjusting to illness  Information Concerns Type Lack of info about diagnosis;Lack of info about treatment  Physical Problem type Getting around;Other (comment)    Clinical Social Worker follow up needed: Yes.    If yes, follow up plan:  See above Loren Racer, LCSW, OSW-C [place] [facility]   [phone]

## 2016-10-29 NOTE — Progress Notes (Deleted)
Opened in error Loren Racer, LCSW, OSW-C [place] [facility]   [phone]

## 2016-10-29 NOTE — Telephone Encounter (Signed)
I have escribed a 2 week supply of Mobic to pharmacy.  Future refills will need to be from PCP or Ortho  TK

## 2016-10-31 ENCOUNTER — Telehealth (HOSPITAL_COMMUNITY): Payer: Self-pay | Admitting: *Deleted

## 2016-10-31 NOTE — Telephone Encounter (Signed)
Received prior authorization for Venlafaxine stating maximum daily dose of 2. Called [employer] at [phone] spoke with pharmacist Neoma Laming who gave approval for 3 capsules daily #[id number] from 07/29/16-07/28/17. Called to notify pharmacy of approval.

## 2016-11-01 DIAGNOSIS — M1711 Unilateral primary osteoarthritis, right knee: Secondary | ICD-10-CM | POA: Diagnosis not present

## 2016-11-01 DIAGNOSIS — S83241D Other tear of medial meniscus, current injury, right knee, subsequent encounter: Secondary | ICD-10-CM | POA: Diagnosis not present

## 2016-11-01 NOTE — Telephone Encounter (Signed)
noted 

## 2016-11-04 ENCOUNTER — Telehealth (HOSPITAL_COMMUNITY): Payer: Self-pay | Admitting: *Deleted

## 2016-11-04 NOTE — Telephone Encounter (Signed)
Office received fax from [place] with response to authorization and exception for pt Venlafaxine HCL. Per fax, a clinical prior authorization is not required on the drug requested.

## 2016-11-13 ENCOUNTER — Ambulatory Visit (HOSPITAL_COMMUNITY): Payer: Self-pay

## 2016-11-13 ENCOUNTER — Ambulatory Visit (HOSPITAL_COMMUNITY)
Admission: RE | Admit: 2016-11-13 | Discharge: 2016-11-13 | Disposition: A | Discharge status: Home / Self Care | Payer: Medicare HMO | Source: Ambulatory Visit | Attending: Oncology | Admitting: Oncology

## 2016-11-13 DIAGNOSIS — C50911 Malignant neoplasm of unspecified site of right female breast: Secondary | ICD-10-CM | POA: Insufficient documentation

## 2016-11-13 DIAGNOSIS — K5732 Diverticulitis of large intestine without perforation or abscess without bleeding: Secondary | ICD-10-CM | POA: Insufficient documentation

## 2016-11-13 DIAGNOSIS — I700 Atherosclerosis of aorta: Secondary | ICD-10-CM | POA: Diagnosis not present

## 2016-11-13 DIAGNOSIS — M4316 Spondylolisthesis, lumbar region: Secondary | ICD-10-CM | POA: Insufficient documentation

## 2016-11-13 DIAGNOSIS — K639 Disease of intestine, unspecified: Secondary | ICD-10-CM | POA: Diagnosis not present

## 2016-11-13 DIAGNOSIS — R918 Other nonspecific abnormal finding of lung field: Secondary | ICD-10-CM | POA: Insufficient documentation

## 2016-11-13 DIAGNOSIS — K469 Unspecified abdominal hernia without obstruction or gangrene: Secondary | ICD-10-CM | POA: Insufficient documentation

## 2016-11-13 MED ORDER — IOPAMIDOL (ISOVUE-300) INJECTION 61%
100.0000 mL | Freq: Once | INTRAVENOUS | Status: AC | PRN
  Administered 2016-11-13: 100 mL via INTRAVENOUS

## 2016-11-18 ENCOUNTER — Other Ambulatory Visit (HOSPITAL_COMMUNITY): Payer: Self-pay

## 2016-11-19 DIAGNOSIS — L280 Lichen simplex chronicus: Secondary | ICD-10-CM | POA: Diagnosis not present

## 2016-11-20 ENCOUNTER — Other Ambulatory Visit (HOSPITAL_COMMUNITY): Payer: Self-pay

## 2016-11-20 ENCOUNTER — Ambulatory Visit (HOSPITAL_COMMUNITY): Payer: Self-pay

## 2016-11-25 ENCOUNTER — Ambulatory Visit (HOSPITAL_COMMUNITY)
Admission: RE | Admit: 2016-11-25 | Discharge: 2016-11-25 | Disposition: A | Discharge status: Home / Self Care | Payer: Medicare HMO | Source: Ambulatory Visit | Attending: Oncology | Admitting: Oncology

## 2016-11-25 ENCOUNTER — Encounter: Payer: Self-pay | Admitting: Orthopedic Surgery

## 2016-11-25 DIAGNOSIS — C50911 Malignant neoplasm of unspecified site of right female breast: Secondary | ICD-10-CM | POA: Insufficient documentation

## 2016-11-25 DIAGNOSIS — M85852 Other specified disorders of bone density and structure, left thigh: Secondary | ICD-10-CM | POA: Diagnosis not present

## 2016-11-25 DIAGNOSIS — M8580 Other specified disorders of bone density and structure, unspecified site: Secondary | ICD-10-CM | POA: Diagnosis not present

## 2016-11-26 ENCOUNTER — Encounter (HOSPITAL_COMMUNITY): Payer: Self-pay | Admitting: Oncology

## 2016-11-26 DIAGNOSIS — L280 Lichen simplex chronicus: Secondary | ICD-10-CM | POA: Diagnosis not present

## 2016-11-26 DIAGNOSIS — M8580 Other specified disorders of bone density and structure, unspecified site: Secondary | ICD-10-CM

## 2016-11-26 HISTORY — DX: Other specified disorders of bone density and structure, unspecified site: M85.80

## 2016-11-28 ENCOUNTER — Ambulatory Visit (INDEPENDENT_AMBULATORY_CARE_PROVIDER_SITE_OTHER): Payer: Medicare HMO | Admitting: Psychiatry

## 2016-11-28 ENCOUNTER — Encounter (HOSPITAL_COMMUNITY): Payer: Self-pay | Admitting: Psychiatry

## 2016-11-28 VITALS — BP 107/75 | HR 76 | Ht 64.0 in | Wt 169.8 lb

## 2016-11-28 DIAGNOSIS — Z79899 Other long term (current) drug therapy: Secondary | ICD-10-CM

## 2016-11-28 DIAGNOSIS — F411 Generalized anxiety disorder: Secondary | ICD-10-CM

## 2016-11-28 DIAGNOSIS — Z79891 Long term (current) use of opiate analgesic: Secondary | ICD-10-CM

## 2016-11-28 DIAGNOSIS — F332 Major depressive disorder, recurrent severe without psychotic features: Secondary | ICD-10-CM | POA: Diagnosis not present

## 2016-11-28 DIAGNOSIS — R69 Illness, unspecified: Secondary | ICD-10-CM | POA: Diagnosis not present

## 2016-11-28 DIAGNOSIS — F418 Other specified anxiety disorders: Secondary | ICD-10-CM

## 2016-11-28 MED ORDER — ALPRAZOLAM 0.5 MG PO TABS: 0.5000 mg | ORAL_TABLET | Freq: Three times a day (TID) | ORAL | 2 refills | Status: DC | PRN

## 2016-11-28 MED ORDER — VENLAFAXINE HCL ER 75 MG PO CP24: ORAL_CAPSULE | ORAL | 2 refills | Status: DC

## 2016-11-28 MED ORDER — RISPERIDONE 0.5 MG PO TABS: 0.5000 mg | ORAL_TABLET | Freq: Every day | ORAL | 2 refills | Status: DC

## 2016-11-28 MED ORDER — ESCITALOPRAM OXALATE 20 MG PO TABS: 20.0000 mg | ORAL_TABLET | Freq: Every day | ORAL | 2 refills | Status: DC
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
## 2016-12-04 DIAGNOSIS — L280 Lichen simplex chronicus: Secondary | ICD-10-CM | POA: Diagnosis not present

## 2016-12-04 DIAGNOSIS — L0100 Impetigo, unspecified: Secondary | ICD-10-CM | POA: Diagnosis not present

## 2016-12-11 DIAGNOSIS — L280 Lichen simplex chronicus: Secondary | ICD-10-CM | POA: Diagnosis not present

## 2016-12-11 DIAGNOSIS — S40811A Abrasion of right upper arm, initial encounter: Secondary | ICD-10-CM | POA: Diagnosis not present

## 2016-12-13 ENCOUNTER — Encounter (HOSPITAL_COMMUNITY): Payer: Medicare HMO | Attending: Oncology | Admitting: Oncology

## 2016-12-13 ENCOUNTER — Ambulatory Visit (HOSPITAL_COMMUNITY): Payer: Medicare HMO

## 2016-12-13 ENCOUNTER — Encounter (HOSPITAL_COMMUNITY): Payer: Self-pay

## 2016-12-13 VITALS — BP 137/64 | HR 85 | Temp 97.7°F | Resp 20 | Wt 170.6 lb

## 2016-12-13 DIAGNOSIS — M25552 Pain in left hip: Secondary | ICD-10-CM | POA: Diagnosis not present

## 2016-12-13 DIAGNOSIS — G8929 Other chronic pain: Secondary | ICD-10-CM

## 2016-12-13 DIAGNOSIS — M818 Other osteoporosis without current pathological fracture: Secondary | ICD-10-CM

## 2016-12-13 DIAGNOSIS — M545 Low back pain: Secondary | ICD-10-CM | POA: Diagnosis not present

## 2016-12-13 DIAGNOSIS — M2550 Pain in unspecified joint: Secondary | ICD-10-CM

## 2016-12-13 DIAGNOSIS — M25551 Pain in right hip: Secondary | ICD-10-CM | POA: Diagnosis not present

## 2016-12-13 DIAGNOSIS — M810 Age-related osteoporosis without current pathological fracture: Secondary | ICD-10-CM | POA: Insufficient documentation

## 2016-12-13 DIAGNOSIS — C50911 Malignant neoplasm of unspecified site of right female breast: Secondary | ICD-10-CM | POA: Insufficient documentation

## 2016-12-13 NOTE — Addendum Note (Signed)
Addended by: Donetta Potts on: 12/13/2016 04:30 PM   Modules accepted: Orders

## 2016-12-13 NOTE — Patient Instructions (Signed)
[facility] Discharge Instructions  RECOMMENDATIONS MADE BY THE CONSULTANT AND ANY TEST RESULTS WILL BE SENT TO YOUR REFERRING PHYSICIAN.  You were seen today by Dr. Louise Zhou    Thank you for choosing [facility] at [facility] to provide your oncology and hematology care.  To afford each patient quality time with our provider, please arrive at least 15 minutes before your scheduled appointment time.    If you have a lab appointment with the Cancer Center please come in thru the  Main Entrance and check in at the main information desk  You need to re-schedule your appointment should you arrive 10 or more minutes late.  We strive to give you quality time with our providers, and arriving late affects you and other patients whose appointments are after yours.  Also, if you no show three or more times for appointments you may be dismissed from the clinic at the providers discretion.     Again, thank you for choosing [facility].  Our hope is that these requests will decrease the amount of time that you wait before being seen by our physicians.       _____________________________________________________________  Should you have questions after your visit to [facility], please contact our office at [phone] between the hours of 8:30 a.m. and 4:30 p.m.  Voicemails left after 4:30 p.m. will not be returned until the following business day.  For prescription refill requests, have your pharmacy contact our office.       Resources For Cancer Patients and their Caregivers ? American Cancer Society: Can assist with transportation, wigs, general needs, runs Look Good Feel Better.        1-888-227-6333 ? Cancer Care: Provides financial assistance, online support groups, medication/co-pay assistance.  1-800-813-HOPE (4673) ? [facility] Assists [place] cancer patients and their  families through emotional , educational and financial support.  336-427-4357 ? [place] DSS Where to apply for food stamps, Medicaid and utility assistance. 336-342-1394 ? [facility]: Transportation to medical appointments. 336-347-2287 ? Social Security Administration: May apply for disability if have a Stage IV cancer. 336-342-7796 1-800-772-1213 ? [place] Aging, Disability and Transit Services: Assists with nutrition, care and transit needs. 336-349-2343  Cancer Center Support Programs: @10RELATIVEDAYS@ > Cancer Support Group  2nd Tuesday of the month 1pm-2pm, Journey Room  > Creative Journey  3rd Tuesday of the month 1130am-1pm, Journey Room  > Look Good Feel Better  1st Wednesday of the month 10am-12 noon, Journey Room (Call American Cancer Society to register 1-800-395-5775)    

## 2016-12-13 NOTE — Progress Notes (Signed)
[facility] Hematology/Oncology Progress Note  Name: Amy [name]      MRN: [id number]    Location: Room/bed info not found  Date: 12/13/2016 Time:3:00 PM   REFERRING PHYSICIAN:  [name], MD (Gen Surg)  REASON FOR CONSULT: Invasive ductal carcinoma of right breast.  Invasive ductal carcinoma of right breast,   DIAGNOSIS:  Invasive ductal carcinoma right breast, Stage IA (pT1aN0M0), ER/PR positive, HER-2 negative, S/P lumpectomy by Dr. Arnoldo Morale on 10/09/2016.  HISTORY OF PRESENT ILLNESS:   Amy [name] is a 81 y.o. female with a medical history significant for depression with anxiety, insomnia secondary to depression, lumbago, diabetes, joint pain who is referred to the [facility] for Invasive ductal carcinoma right breast, Stage IA (pT1aN0M0), ER/PR positive, HER-2 negative.    Invasive ductal carcinoma of breast, female, right ([place])   10/09/2016 Procedure    Left breast lumpectomy by Dr. Arnoldo Morale      10/16/2016 Pathology Results    Invasive, grade 1, ductal carcinoma, spanning 0.18 cmIn greatest dimension.  Low and intermediate grade DCIS with associated calcifications.  Intraductal papilloma formation with associated low-grade DCIS.  DCIS is less than 0.1 cm from the margin in multiple areas including intermediate ductal carcinoma in situ adjacent to cauterized edge.       10/16/2016 Pathology Results    ER +100%, PR +90%, HER-2 negative.      10/23/2016 Cancer Staging    Cancer Staging Invasive ductal carcinoma of breast, female, right [facility]) Staging form: Breast, AJCC 8th Edition - Pathologic stage from 10/23/2016: Stage Unknown (pT1a, pNX, cM0, G1, ER: Positive, PR: Positive, HER2: Negative) - Signed by Baird Cancer, PA-C on 10/23/2016       11/14/2016 Imaging    CT CAP-  1. No compelling findings of metastatic breast cancer. 2. Acute mild diverticulitis of the descending colon. 3. Further distally in the descending colon but  there is a cluster of small lymph nodes adjacent to the colon. Although possibly related to the diverticulitis this is a bit distend and this type of clustered nodes is unusual in this location. Following resolution of the acute process, consider colonoscopy to exclude an underlying occult cancer. 4. 3.7 cm fatty mass along the lumen of the descending colon favors a lipoma or fatty polyp. 5. Aortic Atherosclerosis (ICD10-I70.0). 6. Minimal tree-in-bud reticulonodular opacities in the left lower lobe characteristic for atypical infectious bronchiolitis. 7. Left groin hernia containing adipose tissue, probably a direct inguinal hernia. 8. Exaggerated lumbar lordosis with grade 1 degenerative anterolisthesis at L4-5.      11/26/2016 Imaging    Bone density- BMD as determined from Femur Neck Left is 0.820 g/cm2 with a T-Score of -1.6.  This patient is considered OSTEOPENIC according to [facility]) criteria.      The patient had CT C/A/P with contrast on 11/14/16 which showed no compelling findings of breast cancer.   She began taking Arimidex on 10/23/16. She has noticed increased joint pain in her lower back since beginning Arimidex. She has scoliosis but it did not hurt nearly as bad. She reports back and bilateral hip pain. States she is getting a 2nd opinion on her right knee for a torn meniscus. She reports it is difficult for her to get up and walk. She will see Dr. Aline Brochure next Wednesday for a second opinion on her knee.   She is taking calcium and Vitamin D. She has been eating well  and has a good appetite. Denies weight loss. She has an appointment with her PCP next Monday.  Review of Systems  Constitutional: Negative.  Negative for chills, fever and weight loss.  HENT: Negative.   Eyes: Negative.   Respiratory: Negative.  Negative for cough.   Cardiovascular: Negative.  Negative for chest pain.  Gastrointestinal: Negative.  Negative for blood in stool,  constipation, diarrhea, melena, nausea and vomiting.  Genitourinary: Negative.   Musculoskeletal: Positive for back pain and joint pain (chronic, increasing).       Bilateral hip pain  Skin: Negative.   Neurological: Negative.  Negative for weakness.  Endo/Heme/Allergies: Negative.   Psychiatric/Behavioral: Positive for depression. The patient is nervous/anxious.     PAST MEDICAL HISTORY:   Past Medical History:  Diagnosis Date  . Anxiety   . Arthritis   . Back pain   . Colon polyps   . Depression   . Diabetes mellitus, type II ([place])    diet controlled  . Hypercholesterolemia   . Invasive ductal carcinoma of breast, female, right ([place])   . Osteopenia 11/26/2016  . Skin-picking disorder    has 3 open wounds on right wrist that are being treated. About a quarter in size.    ALLERGIES: Allergies  Allergen Reactions  . Cymbalta [Duloxetine Hcl] Other (See Comments)    Hallucinated and couldn't think that got worse and worse the longer she took this.      MEDICATIONS: I have reviewed the patient's current medications.    Current Outpatient Prescriptions on File Prior to Visit  Medication Sig Dispense Refill  . ALPRAZolam (XANAX) 0.5 MG tablet Take 1 tablet (0.5 mg total) by mouth 3 (three) times daily as needed for anxiety. 90 tablet 2  . anastrozole (ARIMIDEX) 1 MG tablet Take 1 tablet (1 mg total) by mouth daily. 30 tablet 2  . calcium carbonate (OS-CAL) 600 MG tablet Take 1 tablet (600 mg total) by mouth 2 (two) times daily with a meal. 60 tablet 11  . Calcium Carbonate-Vitamin D (OYSTER CALCIUM + D PO) Take by mouth 2 (two) times daily.    . cholecalciferol (VITAMIN D) 1000 units tablet Take 1 tablet (1,000 Units total) by mouth daily. 30 tablet 11  . cyanocobalamin 1000 MCG tablet Take 1,000 mcg by mouth daily.    Marland Kitchen escitalopram (LEXAPRO) 20 MG tablet Take 1 tablet (20 mg total) by mouth daily. 30 tablet 2  . metFORMIN (GLUCOPHAGE) 500 MG tablet Take 500 mg by mouth 2 (two)  times daily with a meal.     . oxyCODONE-acetaminophen (PERCOCET/ROXICET) 5-325 MG tablet Take 1 tablet by mouth every 6 (six) hours as needed for moderate pain or severe pain. 30 tablet 0  . risperiDONE (RISPERDAL) 0.5 MG tablet Take 1 tablet (0.5 mg total) by mouth at bedtime. 30 tablet 2  . valACYclovir (VALTREX) 500 MG tablet Take 1,000 mg by mouth See admin instructions. During outbreak - pt takes 2 tabs for 3 days     . venlafaxine XR (EFFEXOR XR) 75 MG 24 hr capsule Take one in the am and two at night 90 capsule 2   No current facility-administered medications on file prior to visit.      PAST SURGICAL HISTORY Past Surgical History:  Procedure Laterality Date  . APPENDECTOMY    . BREAST BIOPSY Right 10/09/2016   Procedure: BREAST BIOPSY WITH NEEDLE LOCALIZATION;  Surgeon: [name], MD;  Location: [facility];  Service: General;  Laterality: Right;  . BREAST  SURGERY Right   . BUNIONECTOMY Bilateral   . FACIAL COSMETIC SURGERY     drooping eyelid and brow  . HERNIA REPAIR Right    Inguinal x2  . KNEE ARTHROSCOPY Right   . SHOULDER SURGERY Right    rotator cuff repair and removal of bone spur  . TEMPOROMANDIBULAR JOINT SURGERY Right     FAMILY HISTORY: Family History  Problem Relation Age of Onset  . Anxiety disorder Mother   . Depression Mother   . Depression Maternal Grandmother   . Alcohol abuse Brother   . Cancer - Other Sister   . ADD / ADHD Neg Hx   . Bipolar disorder Neg Hx   . Dementia Neg Hx   . Drug abuse Neg Hx   . OCD Neg Hx   . Paranoid behavior Neg Hx   . Schizophrenia Neg Hx   . Seizures Neg Hx   . Sexual abuse Neg Hx   . Physical abuse Neg Hx    Mother deceased at the age of 31 secondary to colon cancer.  She was a chewing tobacco user. Father passed away at the age of 30 secondary to lung cancer; he was a smoker. She has 2 adopted children in her 80s. She has 1 living sister. She has one brother who passed away as a toddler secondary to  pneumonia She has 1 sister who passed away in her 70s from breast cancer. One brother passed away in his 2s secondary to head and neck cancer. Maternal grandparents both deceased from cancer of unknown kind.  SOCIAL HISTORY:  reports that she quit smoking about 21 years ago. Her smoking use included Cigarettes. She has a 20.00 pack-year smoking history. She has never used smokeless tobacco. She reports that she does not drink alcohol or use drugs.  She is Psychologist, forensic and religion.  She is retired from [employer] as a Marine scientist.  She is divorced.  She has no biological children.  Social History   Social History  . Marital status: Divorced    Spouse name: N/A  . Number of children: N/A  . Years of education: N/A   Social History Main Topics  . Smoking status: Former Smoker    Packs/day: 0.50    Years: 40.00    Types: Cigarettes    Quit date: 09/04/1995  . Smokeless tobacco: Never Used  . Alcohol use No  . Drug use: No  . Sexual activity: No   Other Topics Concern  . Not on file   Social History Narrative  . No narrative on file    PERFORMANCE STATUS: The patient's performance status is 1 - Symptomatic but completely ambulatory  PHYSICAL EXAM: Most Recent Vital Signs: Blood pressure 137/64, pulse 85, temperature 97.7 F (36.5 C), temperature source Oral, resp. rate 20, weight 170 lb 9.6 oz (77.4 kg), SpO2 96 %. BP 137/64 (BP Location: Left Arm, Patient Position: Sitting)   Pulse 85   Temp 97.7 F (36.5 C) (Oral)   Resp 20   Wt 170 lb 9.6 oz (77.4 kg)   SpO2 96%   BMI 29.28 kg/m   General Appearance:    Alert, cooperative, no distress, appears stated age, unaccompanied.  Head:    Normocephalic, without obvious abnormality, atraumatic  Eyes:    Conjunctiva/corneas clear, EOM's intact, benign, both eyes  Ears:    Normal TM's and external ear canals, both ears  Nose:   Nares normal, septum midline, mucosa normal, no drainage  or sinus tenderness  Throat:    Lips, mucosa, and tongue normal; teeth and gums normal  Neck:   Supple, symmetrical, trachea midline, no adenopathy.  Back:     Symmetric, no curvature, ROM normal, no CVA tenderness  Lungs:     Clear to auscultation bilaterally, respirations unlabored  Chest Wall:    No tenderness or deformity   Heart:    Regular rate and rhythm, S1 and S2 normal, no murmur, rub   or gallop     Abdomen:     Soft, non-tender, bowel sounds active all four quadrants,    no masses, no organomegaly  Genitalia:    Not examined  Rectal:    Not examined  Extremities:   Extremities normal, atraumatic, no cyanosis or edema  Pulses:   2+ and symmetric all extremities  Skin:   Skin color, texture, turgor normal, no rashes or lesions  Lymph nodes:   Cervical, supraclavicular, and axillary nodes normal  Neurologic:   CNII-XII intact, normal strength, sensation and reflexes    throughout    LABORATORY DATA:  I have reviewed the data as listed. CBC    Component Value Date/Time   WBC 6.6 10/23/2016 1618   RBC 4.07 10/23/2016 1618   HGB 12.5 10/23/2016 1618   HCT 37.9 10/23/2016 1618   PLT 282 10/23/2016 1618   MCV 93.1 10/23/2016 1618   MCH 30.7 10/23/2016 1618   MCHC 33.0 10/23/2016 1618   RDW 13.3 10/23/2016 1618   LYMPHSABS 1.9 10/23/2016 1618   MONOABS 0.5 10/23/2016 1618   EOSABS 0.2 10/23/2016 1618   BASOSABS 0.0 10/23/2016 1618     Chemistry      Component Value Date/Time   NA 133 (L) 10/23/2016 1618   K 4.3 10/23/2016 1618   CL 102 10/23/2016 1618   CO2 26 10/23/2016 1618   BUN 21 (H) 10/23/2016 1618   CREATININE 0.99 10/23/2016 1618      Component Value Date/Time   CALCIUM 8.8 (L) 10/23/2016 1618   ALKPHOS 58 10/23/2016 1618   AST 15 10/23/2016 1618   ALT 11 (L) 10/23/2016 1618   BILITOT 0.4 10/23/2016 1618       RADIOGRAPHY: I have personally reviewed the radiological images as listed and agreed with the findings in the report.  CT C/A/P 11/14/2016 IMPRESSION: 1. No compelling  findings of metastatic breast cancer. 2. Acute mild diverticulitis of the descending colon. 3. Further distally in the descending colon but there is a cluster of small lymph nodes adjacent to the colon. Although possibly related to the diverticulitis this is a bit distend and this type of clustered nodes is unusual in this location. Following resolution of the acute process, consider colonoscopy to exclude an underlying occult cancer. 4. 3.7 cm fatty mass along the lumen of the descending colon favors a lipoma or fatty polyp. 5.  Aortic Atherosclerosis (ICD10-I70.0). 6. Minimal tree-in-bud reticulonodular opacities in the left lower lobe characteristic for atypical infectious bronchiolitis. 7. Left groin hernia containing adipose tissue, probably a direct inguinal hernia. 8. Exaggerated lumbar lordosis with grade 1 degenerative anterolisthesis at L4-5.  Bone Density 11/25/2016 DENSITOMETRY RESULTS: Site         Region     Measured Date Measured Age WHO Classification Young Adult T-score BMD         %Change vs. Previous Significant Change (*) DualFemur Neck Left 11/25/[age] Oste[age] -1.8 0.820 g/cm2 -8.1% Yes DualFemur Neck Left 06/20/2015 77.4 Normal -1.0 0.892 g/cm2 - -  Left Forearm Radius 33% 11/25/2016 78.9 Normal -0.1 0.709 g/cm2 -1.8% - Left Forearm Radius 33% 06/20/2015 77.4 Normal 0.1 0.721 g/cm2 - - ASSESSMENT: BMD as determined from Femur Neck Left is 0.820 g/cm2 with a T-Score of -1.6.  This patient is considered OSTEOPENIC according to World Health Organization [facility]) criteria.  Compared with the prior study on 06/20/2015, the BMD of the left femoral neck shows a statistically significant decrease.  Compared with the prior study on 06/20/2015, the BMD of the left forearm shows no statistically significant change. No results found.     PATHOLOGY:    REASON FOR ADDENDUM, AMENDMENT OR CORRECTION: [id number]: AMENDMENT NOTE: Previously  reported as: - LOW AND INTERMEDIATE GRADE DUCTAL CARCINOMA IN SITU WITH ASSOCIATED CALCIFICATIONS. - INTRADUCTAL PAPILLOMA FORMATION WITH ASSOCIATED LOW GRADE DUCTAL CARCINOMA IN SITU. - DUCTAL CARCINOMA IN SITU IS LESS THAN 0.1 CM FROM THE MARGIN IN MULTIPLE AREAS INCLUDING INTERMEDIATE DUCTAL CARCINOMA IN SITU ADJACENT TO CAUTERIZED EDGE. - SEE ONCOLOGY TEMPLATE. AMENDMENT REASON: On deeper levels cut for the quantitative estrogen receptor and progesterone receptor studies, an additional deeper level H&E slide was also cut and examined. On this additional H&E slide there is a small focus of invasive grade 1 ductal carcinoma measuring 0.18 cm in greatest dimension. The focus is away from the margins. The presence of tumor is confirmed with immunohistochemical stains. The diagnosis is updated to include this additional finding and the oncology template is replaced with an invasive carcinoma oncology template (the comment is also updated). The findings are called to Dr. Franky Macho on 10/16/2016. ATTENTION: Revised diagnosis report. Previous sign out date: 10/24/2016 (initials: RAH / ecj date: 10/16/2016) ADDITIONAL INFORMATION: FLUORESCENCE IN-SITU HYBRIDIZATION Results: HER2 - NEGATIVE RATIO OF HER2/CEP17 SIGNALS 1.38 AVERAGE HER2 COPY NUMBER PER CELL 2.35 Reference Range: NEGATIVE HER2/CEP17 Ratio <2.0 and average HER2 copy number <4.0 EQUIVOCAL HER2/CEP17 Ratio <2.0 and average HER2 copy number >=4.0 and <6.0 POSITIVE HER2/CEP17 Ratio >=2.0 or <2.0 and average HER2 copy number >=6.0 Valinda Hoar MD 1 of 4 Amended copy Amended FINAL for JACEE, ENERSON ([id number]) ADDITIONAL INFORMATION:(continued) Sports administrator, Electronic Signature ( Signed 10/21/2016) PROGNOSTIC INDICATORS Results: IMMUNOHISTOCHEMICAL AND MORPHOMETRIC ANALYSIS PERFORMED MANUALLY Estrogen Receptor: 100%, POSITIVE, STRONG STAINING INTENSITY Progesterone Receptor: 90%, POSITIVE, STRONG STAINING  INTENSITY Proliferation Marker Ki67: 1% REFERENCE RANGE ESTROGEN RECEPTOR NEGATIVE 0% POSITIVE =>1% REFERENCE RANGE PROGESTERONE RECEPTOR NEGATIVE 0% POSITIVE =>1% All controls stained appropriately Valinda Hoar MD Pathologist, Electronic Signature ( Signed 10/17/2016) FINAL DIAGNOSIS Diagnosis Breast, lumpectomy, right - INVASIVE GRADE 1 DUCTAL CARCINOMA, SPANNING 0.18 CM IN GREATEST DIMENSION. - LOW AND INTERMEDIATE GRADE DUCTAL CARCINOMA IN SITU WITH ASSOCIATED CALCIFICATIONS. - INTRADUCTAL PAPILLOMA FORMATION WITH ASSOCIATED LOW GRADE DUCTAL CARCINOMA IN SITU. - DUCTAL CARCINOMA IN SITU IS LESS THAN 0.1 CM FROM THE MARGIN IN MULTIPLE AREAS INCLUDING INTERMEDIATE DUCTAL CARCINOMA IN SITU ADJACENT TO CAUTERIZED EDGE. - SEE ONCOLOGY TEMPLATE. Microscopic Comment BREAST, INVASIVE TUMOR Procedure: Right breast lumpectomy. Laterality: Right. Tumor Size: 0.18 cm. Histologic Type: Invasive ductal carcinoma. Grade: 1. Tubular Differentiation: 1. Nuclear Pleomorphism: 1. Mitotic Count: 1. Ductal Carcinoma in Situ (DCIS): Yes, low and intermediate grade ductal carcinoma in situ with calcifications is present. Extent of Tumor: Invasive and in situ tumor is present in breast tissue. 2 of 4 Amended copy Amended FINAL for JERELYN, TRIMARCO ([id number]) Microscopic Comment(continued) Skin: Not received. Nipple: Not received. Skeletal muscle: Not received. Margins: Invasive carcinoma, distance from closest margin: Greater than 0.4 cm from the margin. DCIS, distance from closest margin: Ductal  carcinoma in situ is less than 0.1 cm from the margin in multiple areas. Regional Lymph Nodes: Number of Lymph Nodes Examined: 0. Number of Sentinel Lymph Nodes Examined: 0. Lymph Nodes with Macrometastases: No lymph nodes examined. Lymph Nodes with Micrometastases: No lymph nodes examined. Lymph Nodes with Isolated Tumor Cells: No lymph nodes examined. Breast Prognostic Profile: Estrogen  Receptor: Will be performed on current invasive tumor. Progesterone Receptor: Will be performed on current tumor. Her-2 FISH: Will be performed on current tumor. Ki-67: Will be performed on current tumor. Pathologic Stage Classification (pTNM, AJCC 8th Edition): Primary Tumor (pT): pT1a. Regional Lymph Nodes (pN): pNX. Comments: Immunohistochemical stains are performed for p63, calponin and smooth muscle myosin on deeper levels of block 1C. The stains confirm a small focus of invasive ductal carcinoma. Dr. Colonel Bald has seen the slides from block 1C, including deeper levels and immunohistochemical stained slides, with agreement that there is a 0.18 cm focus of invasive grade 1 ductal carcinoma present. Dr. Leticia Penna has seen the original slides of the case with agreement that the findings on the original slides represent low and intermediate grade ductal carcinoma in situ with associated calcifications; intraductal papilloma formation with associated low grade ductal carcinoma in situ; and that ductal carcinoma in situ is less than 0.1 cm from the margin in multiple areas (including intermediate grade ductal carcinoma in situ adjacent to the cauterized edge). (RH:ecj 10/16/2016) Zandra Abts MD Pathologist, Electronic Signature (Case signed 10/14/2016) Corrected Report Signer Zandra Abts MD Pathologist, Electronic Signature (Case signed 10/16/2016)  ASSESSMENT/PLAN:   Invasive ductal carcinoma right breast, Stage IA (pT1aN0M0), ER/PR positive, HER-2 negative, S/P lumpectomy by Dr. Lovell Sheehan on 10/09/2016. AND Previous Dx of osteoporosis complicated by intolerance to oral bisphosphonate therapy x 3.  PLAN: Clinically NED. Most recent bone density scan on 11/25/16 demonstrated osteoporosis. I have discussed prolia q 6 months with the patient and have reviewed side effects including osteonecrosis of the jaw and she is willing to proceed.  She will begin Prolia next week.  The patient had CT  C/A/P with contrast on 11/14/16 to evaluate for her unexplained weight loss and there was no evidence of metastatic breast cancer or any other signs of malignancy.   She is taking Arimidex. She has chronic joint pain associated with scoliosis and reports increasing joint pain since beginning Arimidex. I have told her that once she gets used to the arimidex her symptoms may improve and also her back pain may be related to her right knee pain. So for now we will see if she gets her torn meniscus fixed if this will resolve her back pain as well. If her arthralgias worsen, then we will plan to switch her to tamoxifen on her next visit. For now, she will continue Arimidex at this time.  She will return for follow up in 3 months with labs. At this visit, we will reassess her joint pain. If joint pain persists or worsens, then she will contact our clinic to be seen sooner.   ORDERS PLACED FOR THIS ENCOUNTER: Orders Placed This Encounter  Procedures  . CBC with Differential  . Comprehensive metabolic panel     All questions were answered. The patient knows to call the clinic with any problems, questions or concerns. We can certainly see the patient much sooner if necessary.  This document serves as a record of services personally performed by Ralene Cork, MD. It was created on her behalf by Delana Meyer, a trained medical scribe. The creation of this  record is based on the scribe's personal observations and the provider's statements to them. This document has been checked and approved by the attending provider.  I have reviewed the above documentation for accuracy and completeness and I agree with the above.  This note is electronically signed by: Carlis Abbott 12/13/2016 3:00 PM

## 2016-12-16 DIAGNOSIS — C50919 Malignant neoplasm of unspecified site of unspecified female breast: Secondary | ICD-10-CM | POA: Diagnosis not present

## 2016-12-16 DIAGNOSIS — I10 Essential (primary) hypertension: Secondary | ICD-10-CM | POA: Diagnosis not present

## 2016-12-16 DIAGNOSIS — E119 Type 2 diabetes mellitus without complications: Secondary | ICD-10-CM | POA: Diagnosis not present

## 2016-12-16 DIAGNOSIS — M159 Polyosteoarthritis, unspecified: Secondary | ICD-10-CM | POA: Diagnosis not present

## 2016-12-18 ENCOUNTER — Encounter: Payer: Self-pay | Admitting: Orthopedic Surgery

## 2016-12-18 ENCOUNTER — Ambulatory Visit (INDEPENDENT_AMBULATORY_CARE_PROVIDER_SITE_OTHER): Payer: Medicare HMO | Admitting: Orthopedic Surgery

## 2016-12-18 VITALS — BP 152/97 | HR 86 | Ht 64.0 in | Wt 167.0 lb

## 2016-12-18 DIAGNOSIS — M1711 Unilateral primary osteoarthritis, right knee: Secondary | ICD-10-CM | POA: Diagnosis not present

## 2016-12-18 DIAGNOSIS — Z9889 Other specified postprocedural states: Secondary | ICD-10-CM | POA: Diagnosis not present

## 2016-12-18 NOTE — Patient Instructions (Addendum)
Total Knee Replacement Total knee replacement is a procedure to replace the knee joint with an artificial (prosthetic) knee joint. The purpose of this surgery is to reduce knee pain and improve knee function. The prosthetic knee joint (prosthesis) is usually made of metal and plastic. It replaces parts of the thigh bone (femur), lower leg bone (tibia), and kneecap (patella) that are removed during the procedure. Tell a health care provider about:  Any allergies you have.  All medicines you are taking, including vitamins, herbs, eye drops, creams, and over-the-counter medicines.  Any problems you or family members have had with anesthetic medicines.  Any blood disorders you have.  Any surgeries you have had.  Any medical conditions you have.  Whether you are pregnant or may be pregnant. What are the risks? Generally, this is a safe procedure. However, problems may occur, including:  Infection.  Bleeding.  Allergic reactions to medicines.  Damage to other structures or organs.  Decreased range of motion of the knee.  Instability of the knee.  Loosening of the prosthetic joint.  Knee pain that does not go away (chronic pain).  What happens before the procedure?  Ask your health care provider about: ? Changing or stopping your regular medicines. This is especially important if you are taking diabetes medicines or blood thinners. ? Taking medicines such as aspirin and ibuprofen. These medicines can thin your blood. Do not take these medicines before your procedure if your health care provider instructs you not to.  Have dental care and routine cleanings completed before your procedure. Plan to not have dental work done for 3 months after your procedure. Germs from anywhere in your body, including your mouth, can travel to your new joint and infect it.  Follow instructions from your health care provider about eating or drinking restrictions.  Ask your health care provider how  your surgical site will be marked or identified.  You may be given antibiotic medicine to help prevent infection.  If your health care provider prescribes physical therapy, do exercises as instructed.  Do not use any tobacco products, such as cigarettes, chewing tobacco, or e-cigarettes. If you need help quitting, ask your health care provider.  You may have a physical exam.  You may have tests, such as: ? X-rays. ? MRI. ? CT scan. ? Bone scans.  You may have a blood or urine sample taken.  Plan to have someone take you home after the procedure.  If you will be going home right after the procedure, plan to have someone with you for at least 24 hours. It is recommended that you have someone to help care for you for at least 4-6 weeks after your procedure. What happens during the procedure?  To reduce your risk of infection: ? Your health care team will wash or sanitize their hands. ? Your skin will be washed with soap.  An IV tube will be inserted into one of your veins.  You will be given one or more of the following: ? A medicine to help you relax (sedative). ? A medicine to numb the area (local anesthetic). ? A medicine to make you fall asleep (general anesthetic). ? A medicine that is injected into your spine to numb the area below and slightly above the injection site (spinal anesthetic). ? A medicine that is injected into an area of your body to numb everything below the injection site (regional anesthetic).  An incision will be made in your knee.  Damaged cartilage and bone   will be removed from your femur, tibia, and patella.  Parts of the prosthesis (liners)will be placed over the areas of bone and cartilage that were removed. A metal liner will be placed over your femur, and plastic liners will be placed over your tibia and the underside of your patella.  One or more small tubes (drains) may be placed near your incision to help drain extra fluid from your surgical  site.  Your incision will be closed with stitches (sutures), skin glue, or adhesive strips. Medicine may be applied to your incision.  A bandage (dressing) will be placed over your incision. The procedure may vary among health care providers and hospitals. What happens after the procedure?  Your blood pressure, heart rate, breathing rate, and blood oxygen level will be monitored often until the medicines you were given have worn off.  You may continue to receive fluids and medicines through an IV tube.  You will have some pain. Pain medicines will be available to help you.  You may have fluid coming from one or more drains in your incision.  You may have to wear compression stockings. These stockings help to prevent blood clots and reduce swelling in your legs.  You will be encouraged to move around as much as possible.  You may be given a continuous passive motion machine to use at home. You will be shown how to use this machine.  Do not drive for 24 hours if you received a sedative. This information is not intended to replace advice given to you by your health care provider. Make sure you discuss any questions you have with your health care provider. Document Released: 10/21/2000 Document Revised: 03/18/2016 Document Reviewed: 06/21/2015 Elsevier Interactive Patient Education  2017 Elsevier Inc.  

## 2016-12-18 NOTE — Progress Notes (Addendum)
NEW PATIENT OFFICE VISIT    Chief Complaint  Patient presents with  . 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 She's having trouble getting up from a seated position.  The pre-surgical MRI indicated 3 compartment arthritis and torn medial and lateral menisci.  The operative notes indicate medial and lateral meniscectomy and chondroplasty    Review of Systems  Respiratory: Negative for shortness of breath.   Cardiovascular: Negative for chest pain.  Musculoskeletal: Positive for back pain and joint pain.   Past Medical History:  Diagnosis Date  . Anxiety   . Arthritis   . Back pain   . Colon polyps   . Depression   . Diabetes mellitus, type II ([place])    diet controlled  . Hypercholesterolemia   . Invasive ductal carcinoma of breast, female, right ([place])   . Osteopenia 11/26/2016  . Skin-picking disorder    has 3 open wounds on right wrist that are being treated. About a quarter in size.    Past Surgical History:  Procedure Laterality Date  . APPENDECTOMY    . BREAST BIOPSY Right 10/09/2016   Procedure: BREAST BIOPSY WITH NEEDLE LOCALIZATION;  Surgeon: [name], MD;  Location: [facility];  Service: General;  Laterality: Right;  . BREAST SURGERY Right   . BUNIONECTOMY Bilateral   . FACIAL COSMETIC SURGERY     drooping eyelid and brow  . HERNIA REPAIR Right    Inguinal x2  . KNEE ARTHROSCOPY Right   . SHOULDER SURGERY Right    rotator cuff repair and removal of bone spur  . TEMPOROMANDIBULAR JOINT SURGERY Right     Family History  Problem Relation Age of Onset  . Anxiety disorder Mother   . Depression Mother   . Depression Maternal Grandmother   . Alcohol abuse Brother   . Cancer - Other Sister   . ADD / ADHD Neg Hx   . Bipolar  disorder Neg Hx   . Dementia Neg Hx   . Drug abuse Neg Hx   . OCD Neg Hx   . Paranoid behavior Neg Hx   . Schizophrenia Neg Hx   . Seizures Neg Hx   . Sexual abuse Neg Hx   . Physical abuse Neg Hx    Social History  Substance Use Topics  . Smoking status: Former Smoker    Packs/day: 0.50    Years: 40.00    Types: Cigarettes    Quit date: 09/04/1995  . Smokeless tobacco: Never Used  . Alcohol use No    BP (!) 152/97   Pulse 86   Ht 5\' 4"  (1.626 m)   Wt 167 lb (75.8 kg)   BMI 28.67 kg/m   Physical Exam  Constitutional: She is oriented to person, place, and time. She appears well-developed and well-nourished.  Neurological: She is alert and oriented to person, place, and time.  Psychiatric: She has a normal mood and affect.  Vitals reviewed.   Ortho Exam Left knee. The patient is normal alignment in the left knee without swelling she has full range of motion ligaments are stable motor exam shows no atrophy she's neurovascularly intact she has some skin discoloration near the ankle.  Right knee is swollen compared to the left she is tender  over the medial and lateral femoral condyles. Her flexion is at least 125 she appears to come to full extension there is no ligamentous instability. Motor exam is normal neurovascular exam is intact skin is normal except for some mild discoloration distally  X-rays from September 2017 compared with March 2018 show accelerated narrowing of the lateral compartment with cyst formation on the tibial plateau. This is best seen on the PA flexion view  Encounter Diagnoses  Name Primary?  . Primary osteoarthritis of right knee Yes  . S/P right knee arthroscopy      PLAN:  At this point I think the patient would either have to have bracing to unload the lateral compartment were knee replacement. However, because of the recent diagnosis of breast cancer and ongoing treatment she is not ready to do that yet and may not want to have that  done.  We left her situation open for further evaluation and treatment and discussion in the future depending on her health.

## 2016-12-19 ENCOUNTER — Encounter (HOSPITAL_COMMUNITY): Payer: Medicare HMO

## 2016-12-19 ENCOUNTER — Encounter (HOSPITAL_BASED_OUTPATIENT_CLINIC_OR_DEPARTMENT_OTHER): Payer: Medicare HMO

## 2016-12-19 VITALS — BP 158/72 | HR 78 | Temp 98.2°F | Resp 20

## 2016-12-19 DIAGNOSIS — C50911 Malignant neoplasm of unspecified site of right female breast: Secondary | ICD-10-CM | POA: Diagnosis not present

## 2016-12-19 DIAGNOSIS — M810 Age-related osteoporosis without current pathological fracture: Secondary | ICD-10-CM

## 2016-12-19 DIAGNOSIS — L280 Lichen simplex chronicus: Secondary | ICD-10-CM | POA: Diagnosis not present

## 2016-12-19 DIAGNOSIS — L0100 Impetigo, unspecified: Secondary | ICD-10-CM | POA: Diagnosis not present

## 2016-12-19 DIAGNOSIS — M818 Other osteoporosis without current pathological fracture: Secondary | ICD-10-CM

## 2016-12-19 LAB — CBC WITH DIFFERENTIAL/PLATELET
Basophils Absolute: 0 10*3/uL (ref 0.0–0.1)
Basophils Relative: 0 %
Eosinophils Absolute: 0.2 10*3/uL (ref 0.0–0.7)
Eosinophils Relative: 3 %
HCT: 39 % (ref 36.0–46.0)
Hemoglobin: 12.9 g/dL (ref 12.0–15.0)
Lymphocytes Relative: 25 %
Lymphs Abs: 1.8 10*3/uL (ref 0.7–4.0)
MCH: 30.1 pg (ref 26.0–34.0)
MCHC: 33.1 g/dL (ref 30.0–36.0)
MCV: 90.9 fL (ref 78.0–100.0)
Monocytes Absolute: 0.4 10*3/uL (ref 0.1–1.0)
Monocytes Relative: 5 %
Neutro Abs: 4.6 10*3/uL (ref 1.7–7.7)
Neutrophils Relative %: 67 %
Platelets: 268 10*3/uL (ref 150–400)
RBC: 4.29 MIL/uL (ref 3.87–5.11)
RDW: 12.2 % (ref 11.5–15.5)
WBC: 6.9 10*3/uL (ref 4.0–10.5)

## 2016-12-19 LAB — COMPREHENSIVE METABOLIC PANEL
ALT: 10 U/L — ABNORMAL LOW (ref 14–54)
AST: 18 U/L (ref 15–41)
Albumin: 3.7 g/dL (ref 3.5–5.0)
Alkaline Phosphatase: 65 U/L (ref 38–126)
Anion gap: 7 (ref 5–15)
BUN: 14 mg/dL (ref 6–20)
CO2: 29 mmol/L (ref 22–32)
Calcium: 8.9 mg/dL (ref 8.9–10.3)
Chloride: 101 mmol/L (ref 101–111)
Creatinine, Ser: 0.79 mg/dL (ref 0.44–1.00)
GFR calc Af Amer: >60 mL/min (ref >60)
GFR calc non Af Amer: >60 mL/min (ref >60)
Glucose, Bld: 127 mg/dL — ABNORMAL HIGH (ref 65–99)
Potassium: 4.2 mmol/L (ref 3.5–5.1)
Sodium: 137 mmol/L (ref 135–145)
Total Bilirubin: 0.2 mg/dL — ABNORMAL LOW (ref 0.3–1.2)
Total Protein: 6.5 g/dL (ref 6.5–8.1)

## 2016-12-19 MED ORDER — DENOSUMAB 60 MG/ML ~~LOC~~ SOLN
60.0000 mg | Freq: Once | SUBCUTANEOUS | Status: AC
  Administered 2016-12-19: 60 mg via SUBCUTANEOUS
  Filled 2016-12-19: qty 1

## 2016-12-19 NOTE — Progress Notes (Signed)
Amy [name] presents today for injection per MD orders. Prolia 60 mg administered SQ in left Abdomen. Administration without incident. Patient tolerated well. Stable and ambulatory on discharge home to self.

## 2016-12-19 NOTE — Patient Instructions (Signed)
[place] at [facility] Discharge Instructions  RECOMMENDATIONS MADE BY THE CONSULTANT AND ANY TEST RESULTS WILL BE SENT TO YOUR REFERRING PHYSICIAN.  Prolia 60 mg injection given today as ordered. You may take OTC ibuprofen/aleve if needed for bone pain/discomfort over the next few days. You can also try OTC claritine for the next few days to treat same bone pain/discomfort.  Denosumab injection What is this medicine? DENOSUMAB (den oh sue mab) slows bone breakdown. Prolia is used to treat osteoporosis in women after menopause and in men. Delton See is used to treat a high calcium level due to cancer and to prevent bone fractures and other bone problems caused by multiple myeloma or cancer bone metastases. Delton See is also used to treat giant cell tumor of the bone. This medicine may be used for other purposes; ask your health care provider or pharmacist if you have questions. COMMON BRAND NAME(S): Prolia, XGEVA What should I tell my health care provider before I take this medicine? They need to know if you have any of these conditions: -dental disease -having surgery or tooth extraction -infection -kidney disease -low levels of calcium or Vitamin D in the blood -malnutrition -on hemodialysis -skin conditions or sensitivity -thyroid or parathyroid disease -an unusual reaction to denosumab, other medicines, foods, dyes, or preservatives -pregnant or trying to get pregnant -breast-feeding How should I use this medicine? This medicine is for injection under the skin. It is given by a health care professional in a hospital or clinic setting. If you are getting Prolia, a special MedGuide will be given to you by the pharmacist with each prescription and refill. Be sure to read this information carefully each time. For Prolia, talk to your pediatrician regarding the use of this medicine in children. Special care may be needed. For Delton See, talk to your pediatrician regarding  the use of this medicine in children. While this drug may be prescribed for children as young as 13 years for selected conditions, precautions do apply. Overdosage: If you think you have taken too much of this medicine contact a poison control center or emergency room at once. NOTE: This medicine is only for you. Do not share this medicine with others. What if I miss a dose? It is important not to miss your dose. Call your doctor or health care professional if you are unable to keep an appointment. What may interact with this medicine? Do not take this medicine with any of the following medications: -other medicines containing denosumab This medicine may also interact with the following medications: -medicines that lower your chance of fighting infection -steroid medicines like prednisone or cortisone This list may not describe all possible interactions. Give your health care provider a list of all the medicines, herbs, non-prescription drugs, or dietary supplements you use. Also tell them if you smoke, drink alcohol, or use illegal drugs. Some items may interact with your medicine. What should I watch for while using this medicine? Visit your doctor or health care professional for regular checks on your progress. Your doctor or health care professional may order blood tests and other tests to see how you are doing. Call your doctor or health care professional for advice if you get a fever, chills or sore throat, or other symptoms of a cold or flu. Do not treat yourself. This drug may decrease your body's ability to fight infection. Try to avoid being around people who are sick. You should make sure you get enough calcium and vitamin D  while you are taking this medicine, unless your doctor tells you not to. Discuss the foods you eat and the vitamins you take with your health care professional. See your dentist regularly. Brush and floss your teeth as directed. Before you have any dental work done,  tell your dentist you are receiving this medicine. Do not become pregnant while taking this medicine or for 5 months after stopping it. Talk with your doctor or health care professional about your birth control options while taking this medicine. Women should inform their doctor if they wish to become pregnant or think they might be pregnant. There is a potential for serious side effects to an unborn child. Talk to your health care professional or pharmacist for more information. What side effects may I notice from receiving this medicine? Side effects that you should report to your doctor or health care professional as soon as possible: -allergic reactions like skin rash, itching or hives, swelling of the face, lips, or tongue -bone pain -breathing problems -dizziness -jaw pain, especially after dental work -redness, blistering, peeling of the skin -signs and symptoms of infection like fever or chills; cough; sore throat; pain or trouble passing urine -signs of low calcium like fast heartbeat, muscle cramps or muscle pain; pain, tingling, numbness in the hands or feet; seizures -unusual bleeding or bruising -unusually weak or tired Side effects that usually do not require medical attention (report to your doctor or health care professional if they continue or are bothersome): -constipation -diarrhea -headache -joint pain -loss of appetite -muscle pain -runny nose -tiredness -upset stomach This list may not describe all possible side effects. Call your doctor for medical advice about side effects. You may report side effects to FDA at 1-800-FDA-1088. Where should I keep my medicine? This medicine is only given in a clinic, doctor's office, or other health care setting and will not be stored at home. NOTE: This sheet is a summary. It may not cover all possible information. If you have questions about this medicine, talk to your doctor, pharmacist, or health care provider.  2018  Elsevier/Gold Standard (2016-08-06 19:17:21)   Thank you for choosing [facility] to provide your oncology and hematology care.  To afford each patient quality time with our provider, please arrive at least 15 minutes before your scheduled appointment time.    If you have a lab appointment with the Cancer Center please come in thru the  Main Entrance and check in at the main information desk  You need to re-schedule your appointment should you arrive 10 or more minutes late.  We strive to give you quality time with our providers, and arriving late affects you and other patients whose appointments are after yours.  Also, if you no show three or more times for appointments you may be dismissed from the clinic at the providers discretion.     Again, thank you for choosing [facility].  Our hope is that these requests will decrease the amount of time that you wait before being seen by our physicians.       _____________________________________________________________  Should you have questions after your visit to [facility], please contact our office at [phone] between the hours of 8:30 a.m. and 4:30 p.m.  Voicemails left after 4:30 p.m. will not be returned until the following business day.  For prescription refill requests, have your pharmacy contact our office.       Resources For Cancer  Patients and their Caregivers ? American Cancer Society: Can assist with transportation, wigs, general needs, runs Look Good Feel Better.        318-457-0352 ? Cancer Care: Provides financial assistance, online support groups, medication/co-pay assistance.  1-800-813-HOPE 619-734-7996) ? [facility] Assists [place] cancer patients and their families through emotional , educational and financial support.  802 083 1968 ? [place] DSS Where to apply for food stamps, Medicaid and utility  assistance. 606-582-3472 ? [facility]: Transportation to medical appointments. 3208807604 ? Social Security Administration: May apply for disability if have a Stage IV cancer. 5318348196 (564) 706-6561 ? [employer], Disability and Transit Services: Assists with nutrition, care and transit needs. [place] Support Programs: '@10RELATIVEDAYS'$ @ > Cancer Support Group  2nd Tuesday of the month 1pm-2pm, Journey Room  > Creative Journey  3rd Tuesday of the month 1130am-1pm, Journey Room  > Look Good Feel Better  1st Wednesday of the month 10am-12 noon, Journey Room (Call [place] to register [phone])

## 2017-01-01 DIAGNOSIS — L0100 Impetigo, unspecified: Secondary | ICD-10-CM | POA: Diagnosis not present

## 2017-01-01 DIAGNOSIS — L280 Lichen simplex chronicus: Secondary | ICD-10-CM | POA: Diagnosis not present

## 2017-01-15 ENCOUNTER — Telehealth (HOSPITAL_COMMUNITY): Payer: Self-pay | Admitting: *Deleted

## 2017-01-15 NOTE — Telephone Encounter (Signed)
voice message from patient, she would like for Dr. [name] to call her.   She has a question.

## 2017-01-16 ENCOUNTER — Other Ambulatory Visit (HOSPITAL_COMMUNITY): Payer: Self-pay | Admitting: Psychiatry

## 2017-01-16 MED ORDER — RISPERIDONE 1 MG PO TABS: 1.0000 mg | ORAL_TABLET | Freq: Every day | ORAL | 2 refills | Status: DC

## 2017-01-16 NOTE — Telephone Encounter (Signed)
risperdal increased. Please call her to get appt with [facility]

## 2017-01-16 NOTE — Telephone Encounter (Signed)
Called pt due to previous call. Spoke to pt and asked pt for more details and she stated she rather speak with Dr. [name] about it. Per pt it's about how she is feeling. Per pt she would like for Dr. [name] to call her at [phone].

## 2017-01-16 NOTE — Telephone Encounter (Signed)
Spoke with pt and she sch appt

## 2017-01-22 ENCOUNTER — Ambulatory Visit (HOSPITAL_COMMUNITY): Payer: Self-pay | Admitting: Licensed Clinical Social Worker

## 2017-01-27 DIAGNOSIS — R69 Illness, unspecified: Secondary | ICD-10-CM | POA: Diagnosis not present

## 2017-01-27 DIAGNOSIS — M545 Low back pain: Secondary | ICD-10-CM | POA: Diagnosis not present

## 2017-01-27 DIAGNOSIS — M546 Pain in thoracic spine: Secondary | ICD-10-CM | POA: Diagnosis not present

## 2017-01-27 DIAGNOSIS — I10 Essential (primary) hypertension: Secondary | ICD-10-CM | POA: Diagnosis not present

## 2017-01-28 ENCOUNTER — Encounter (HOSPITAL_COMMUNITY): Payer: Self-pay | Admitting: Psychiatry

## 2017-01-28 ENCOUNTER — Ambulatory Visit (INDEPENDENT_AMBULATORY_CARE_PROVIDER_SITE_OTHER): Payer: Medicare HMO | Admitting: Psychiatry

## 2017-01-28 VITALS — BP 138/72 | HR 77 | Ht 64.0 in | Wt 166.0 lb

## 2017-01-28 DIAGNOSIS — F411 Generalized anxiety disorder: Secondary | ICD-10-CM | POA: Diagnosis not present

## 2017-01-28 DIAGNOSIS — F418 Other specified anxiety disorders: Secondary | ICD-10-CM

## 2017-01-28 DIAGNOSIS — F332 Major depressive disorder, recurrent severe without psychotic features: Secondary | ICD-10-CM | POA: Diagnosis not present

## 2017-01-28 DIAGNOSIS — Z818 Family history of other mental and behavioral disorders: Secondary | ICD-10-CM | POA: Diagnosis not present

## 2017-01-28 DIAGNOSIS — Z79891 Long term (current) use of opiate analgesic: Secondary | ICD-10-CM | POA: Diagnosis not present

## 2017-01-28 DIAGNOSIS — Z7984 Long term (current) use of oral hypoglycemic drugs: Secondary | ICD-10-CM | POA: Diagnosis not present

## 2017-01-28 DIAGNOSIS — Z811 Family history of alcohol abuse and dependence: Secondary | ICD-10-CM

## 2017-01-28 DIAGNOSIS — R69 Illness, unspecified: Secondary | ICD-10-CM | POA: Diagnosis not present

## 2017-01-28 DIAGNOSIS — Z79899 Other long term (current) drug therapy: Secondary | ICD-10-CM

## 2017-01-28 DIAGNOSIS — Z888 Allergy status to other drugs, medicaments and biological substances status: Secondary | ICD-10-CM

## 2017-01-28 DIAGNOSIS — Z87891 Personal history of nicotine dependence: Secondary | ICD-10-CM

## 2017-01-28 MED ORDER — VENLAFAXINE HCL ER 150 MG PO CP24: 150.0000 mg | ORAL_CAPSULE | Freq: Every day | ORAL | 2 refills | Status: DC

## 2017-01-28 MED ORDER — ESCITALOPRAM OXALATE 20 MG PO TABS: 20.0000 mg | ORAL_TABLET | Freq: Every day | ORAL | 2 refills | Status: DC

## 2017-01-28 MED ORDER — RISPERIDONE 1 MG PO TABS: 1.0000 mg | ORAL_TABLET | Freq: Every day | ORAL | 2 refills | Status: DC

## 2017-01-28 MED ORDER — ALPRAZOLAM 0.5 MG PO TABS: 0.5000 mg | ORAL_TABLET | Freq: Three times a day (TID) | ORAL | 2 refills | Status: DC | PRN

## 2017-01-28 MED ORDER — VENLAFAXINE HCL ER 150 MG PO CP24: 150.0000 mg | ORAL_CAPSULE | Freq: Two times a day (BID) | ORAL | 2 refills | Status: DC
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
## 2017-01-30 ENCOUNTER — Other Ambulatory Visit (HOSPITAL_COMMUNITY): Payer: Self-pay | Admitting: Pulmonary Disease

## 2017-01-30 ENCOUNTER — Encounter (HOSPITAL_COMMUNITY): Payer: Self-pay | Admitting: Licensed Clinical Social Worker

## 2017-01-30 ENCOUNTER — Ambulatory Visit (INDEPENDENT_AMBULATORY_CARE_PROVIDER_SITE_OTHER): Payer: Medicare HMO | Admitting: Licensed Clinical Social Worker

## 2017-01-30 ENCOUNTER — Ambulatory Visit (HOSPITAL_COMMUNITY)
Admission: RE | Admit: 2017-01-30 | Discharge: 2017-01-30 | Disposition: A | Discharge status: Home / Self Care | Payer: Medicare HMO | Source: Ambulatory Visit | Attending: Pulmonary Disease | Admitting: Pulmonary Disease

## 2017-01-30 DIAGNOSIS — F330 Major depressive disorder, recurrent, mild: Secondary | ICD-10-CM | POA: Diagnosis not present

## 2017-01-30 DIAGNOSIS — M48061 Spinal stenosis, lumbar region without neurogenic claudication: Secondary | ICD-10-CM | POA: Insufficient documentation

## 2017-01-30 DIAGNOSIS — M4317 Spondylolisthesis, lumbosacral region: Secondary | ICD-10-CM | POA: Insufficient documentation

## 2017-01-30 DIAGNOSIS — M546 Pain in thoracic spine: Secondary | ICD-10-CM | POA: Diagnosis not present

## 2017-01-30 DIAGNOSIS — M545 Low back pain: Secondary | ICD-10-CM

## 2017-01-30 DIAGNOSIS — M4807 Spinal stenosis, lumbosacral region: Secondary | ICD-10-CM | POA: Insufficient documentation

## 2017-01-30 DIAGNOSIS — R69 Illness, unspecified: Secondary | ICD-10-CM | POA: Diagnosis not present
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
## 2017-02-11 DIAGNOSIS — I10 Essential (primary) hypertension: Secondary | ICD-10-CM | POA: Diagnosis not present

## 2017-02-11 DIAGNOSIS — M545 Low back pain: Secondary | ICD-10-CM | POA: Diagnosis not present

## 2017-02-11 DIAGNOSIS — M546 Pain in thoracic spine: Secondary | ICD-10-CM | POA: Diagnosis not present

## 2017-02-11 DIAGNOSIS — E119 Type 2 diabetes mellitus without complications: Secondary | ICD-10-CM | POA: Diagnosis not present

## 2017-02-11 DIAGNOSIS — R69 Illness, unspecified: Secondary | ICD-10-CM | POA: Diagnosis not present

## 2017-02-12 ENCOUNTER — Other Ambulatory Visit (HOSPITAL_COMMUNITY): Payer: Self-pay | Admitting: Pulmonary Disease

## 2017-02-12 DIAGNOSIS — E119 Type 2 diabetes mellitus without complications: Secondary | ICD-10-CM | POA: Diagnosis not present

## 2017-02-12 DIAGNOSIS — M545 Low back pain: Secondary | ICD-10-CM

## 2017-02-12 DIAGNOSIS — R69 Illness, unspecified: Secondary | ICD-10-CM | POA: Diagnosis not present

## 2017-02-12 DIAGNOSIS — M159 Polyosteoarthritis, unspecified: Secondary | ICD-10-CM | POA: Diagnosis not present

## 2017-02-13 ENCOUNTER — Ambulatory Visit (INDEPENDENT_AMBULATORY_CARE_PROVIDER_SITE_OTHER): Payer: Medicare HMO | Admitting: Licensed Clinical Social Worker

## 2017-02-13 ENCOUNTER — Encounter (HOSPITAL_COMMUNITY): Payer: Self-pay | Admitting: Licensed Clinical Social Worker

## 2017-02-13 DIAGNOSIS — F330 Major depressive disorder, recurrent, mild: Secondary | ICD-10-CM

## 2017-02-13 DIAGNOSIS — R69 Illness, unspecified: Secondary | ICD-10-CM | POA: Diagnosis not present
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
## 2017-02-20 ENCOUNTER — Ambulatory Visit (HOSPITAL_COMMUNITY)
Admission: RE | Admit: 2017-02-20 | Discharge: 2017-02-20 | Disposition: A | Discharge status: Home / Self Care | Payer: Medicare HMO | Source: Ambulatory Visit | Attending: Pulmonary Disease | Admitting: Pulmonary Disease

## 2017-02-20 DIAGNOSIS — M545 Low back pain: Secondary | ICD-10-CM | POA: Diagnosis not present

## 2017-02-20 DIAGNOSIS — M4807 Spinal stenosis, lumbosacral region: Secondary | ICD-10-CM | POA: Diagnosis not present

## 2017-02-20 DIAGNOSIS — M1388 Other specified arthritis, other site: Secondary | ICD-10-CM | POA: Insufficient documentation

## 2017-02-20 DIAGNOSIS — M4186 Other forms of scoliosis, lumbar region: Secondary | ICD-10-CM | POA: Insufficient documentation

## 2017-02-24 ENCOUNTER — Other Ambulatory Visit (HOSPITAL_COMMUNITY): Payer: Self-pay | Admitting: Oncology

## 2017-02-24 DIAGNOSIS — C50911 Malignant neoplasm of unspecified site of right female breast: Secondary | ICD-10-CM

## 2017-02-25 DIAGNOSIS — B351 Tinea unguium: Secondary | ICD-10-CM | POA: Diagnosis not present

## 2017-02-25 DIAGNOSIS — E1142 Type 2 diabetes mellitus with diabetic polyneuropathy: Secondary | ICD-10-CM | POA: Diagnosis not present

## 2017-02-25 DIAGNOSIS — M2040 Other hammer toe(s) (acquired), unspecified foot: Secondary | ICD-10-CM | POA: Diagnosis not present

## 2017-02-25 DIAGNOSIS — R252 Cramp and spasm: Secondary | ICD-10-CM | POA: Diagnosis not present

## 2017-02-25 DIAGNOSIS — L851 Acquired keratosis [keratoderma] palmaris et plantaris: Secondary | ICD-10-CM | POA: Diagnosis not present

## 2017-02-26 ENCOUNTER — Other Ambulatory Visit (HOSPITAL_COMMUNITY): Payer: Self-pay | Admitting: Podiatry

## 2017-02-26 DIAGNOSIS — E1142 Type 2 diabetes mellitus with diabetic polyneuropathy: Secondary | ICD-10-CM

## 2017-02-26 DIAGNOSIS — R252 Cramp and spasm: Secondary | ICD-10-CM

## 2017-03-04 ENCOUNTER — Other Ambulatory Visit: Payer: Self-pay | Admitting: Pulmonary Disease

## 2017-03-04 ENCOUNTER — Ambulatory Visit (HOSPITAL_COMMUNITY)
Admission: RE | Admit: 2017-03-04 | Discharge: 2017-03-04 | Disposition: A | Discharge status: Home / Self Care | Payer: Medicare HMO | Source: Ambulatory Visit | Attending: Podiatry | Admitting: Podiatry

## 2017-03-04 DIAGNOSIS — R252 Cramp and spasm: Secondary | ICD-10-CM

## 2017-03-04 DIAGNOSIS — M48061 Spinal stenosis, lumbar region without neurogenic claudication: Secondary | ICD-10-CM

## 2017-03-04 DIAGNOSIS — E119 Type 2 diabetes mellitus without complications: Secondary | ICD-10-CM | POA: Diagnosis not present

## 2017-03-04 DIAGNOSIS — E1142 Type 2 diabetes mellitus with diabetic polyneuropathy: Secondary | ICD-10-CM | POA: Diagnosis not present

## 2017-03-11 ENCOUNTER — Ambulatory Visit (INDEPENDENT_AMBULATORY_CARE_PROVIDER_SITE_OTHER): Payer: Medicare HMO | Admitting: Licensed Clinical Social Worker

## 2017-03-11 DIAGNOSIS — F330 Major depressive disorder, recurrent, mild: Secondary | ICD-10-CM | POA: Diagnosis not present

## 2017-03-11 DIAGNOSIS — R69 Illness, unspecified: Secondary | ICD-10-CM | POA: Diagnosis not present
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
## 2017-03-12 ENCOUNTER — Ambulatory Visit
Admission: RE | Admit: 2017-03-12 | Discharge: 2017-03-12 | Disposition: A | Discharge status: Home / Self Care | Payer: Medicare HMO | Source: Ambulatory Visit | Attending: Pulmonary Disease | Admitting: Pulmonary Disease

## 2017-03-12 DIAGNOSIS — M545 Low back pain: Secondary | ICD-10-CM | POA: Diagnosis not present

## 2017-03-12 DIAGNOSIS — M48061 Spinal stenosis, lumbar region without neurogenic claudication: Secondary | ICD-10-CM

## 2017-03-12 MED ORDER — IOPAMIDOL (ISOVUE-M 200) INJECTION 41%
1.0000 mL | Freq: Once | INTRAMUSCULAR | Status: AC
  Administered 2017-03-12: 1 mL via EPIDURAL

## 2017-03-12 MED ORDER — METHYLPREDNISOLONE ACETATE 40 MG/ML INJ SUSP (RADIOLOG
120.0000 mg | Freq: Once | INTRAMUSCULAR | Status: AC
  Administered 2017-03-12: 120 mg via EPIDURAL

## 2017-03-12 NOTE — Discharge Instructions (Signed)

## 2017-03-14 ENCOUNTER — Other Ambulatory Visit (HOSPITAL_COMMUNITY): Payer: Self-pay

## 2017-03-14 ENCOUNTER — Ambulatory Visit (HOSPITAL_COMMUNITY): Payer: Self-pay | Admitting: Adult Health

## 2017-03-21 DIAGNOSIS — E1142 Type 2 diabetes mellitus with diabetic polyneuropathy: Secondary | ICD-10-CM | POA: Diagnosis not present

## 2017-03-21 DIAGNOSIS — R252 Cramp and spasm: Secondary | ICD-10-CM | POA: Diagnosis not present

## 2017-03-24 DIAGNOSIS — R413 Other amnesia: Secondary | ICD-10-CM | POA: Diagnosis not present

## 2017-03-24 DIAGNOSIS — E119 Type 2 diabetes mellitus without complications: Secondary | ICD-10-CM | POA: Diagnosis not present

## 2017-03-24 DIAGNOSIS — R69 Illness, unspecified: Secondary | ICD-10-CM | POA: Diagnosis not present

## 2017-03-24 DIAGNOSIS — M159 Polyosteoarthritis, unspecified: Secondary | ICD-10-CM | POA: Diagnosis not present

## 2017-03-24 DIAGNOSIS — M545 Low back pain: Secondary | ICD-10-CM | POA: Diagnosis not present

## 2017-03-26 DIAGNOSIS — E1342 Other specified diabetes mellitus with diabetic polyneuropathy: Secondary | ICD-10-CM | POA: Diagnosis not present

## 2017-04-01 ENCOUNTER — Ambulatory Visit (HOSPITAL_COMMUNITY): Payer: Medicare HMO | Admitting: Psychiatry

## 2017-04-02 ENCOUNTER — Ambulatory Visit (INDEPENDENT_AMBULATORY_CARE_PROVIDER_SITE_OTHER): Payer: Medicare HMO | Admitting: Licensed Clinical Social Worker

## 2017-04-02 DIAGNOSIS — F330 Major depressive disorder, recurrent, mild: Secondary | ICD-10-CM

## 2017-04-02 DIAGNOSIS — R69 Illness, unspecified: Secondary | ICD-10-CM | POA: Diagnosis not present

## 2017-04-03 ENCOUNTER — Telehealth (HOSPITAL_COMMUNITY): Payer: Self-pay | Admitting: *Deleted

## 2017-04-03 ENCOUNTER — Ambulatory Visit (HOSPITAL_COMMUNITY): Payer: Self-pay | Admitting: Psychiatry

## 2017-04-03 NOTE — Telephone Encounter (Signed)
phone call from patient at 11:45 a.m. today.   She said she is too weak to come to appointment today.   She said yesterday she went to the grocery store and couldn't hardly make it back home, she was so weak.
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
## 2017-04-07 ENCOUNTER — Ambulatory Visit (INDEPENDENT_AMBULATORY_CARE_PROVIDER_SITE_OTHER): Payer: Medicare HMO | Admitting: Psychiatry

## 2017-04-07 ENCOUNTER — Encounter (HOSPITAL_COMMUNITY): Payer: Self-pay | Admitting: Psychiatry

## 2017-04-07 VITALS — BP 120/80 | Ht 64.0 in | Wt 163.0 lb

## 2017-04-07 DIAGNOSIS — R450 Nervousness: Secondary | ICD-10-CM

## 2017-04-07 DIAGNOSIS — R69 Illness, unspecified: Secondary | ICD-10-CM | POA: Diagnosis not present

## 2017-04-07 DIAGNOSIS — R5383 Other fatigue: Secondary | ICD-10-CM

## 2017-04-07 DIAGNOSIS — M542 Cervicalgia: Secondary | ICD-10-CM | POA: Diagnosis not present

## 2017-04-07 DIAGNOSIS — Z811 Family history of alcohol abuse and dependence: Secondary | ICD-10-CM

## 2017-04-07 DIAGNOSIS — M549 Dorsalgia, unspecified: Secondary | ICD-10-CM | POA: Diagnosis not present

## 2017-04-07 DIAGNOSIS — F411 Generalized anxiety disorder: Secondary | ICD-10-CM

## 2017-04-07 DIAGNOSIS — F419 Anxiety disorder, unspecified: Secondary | ICD-10-CM | POA: Diagnosis not present

## 2017-04-07 DIAGNOSIS — Z818 Family history of other mental and behavioral disorders: Secondary | ICD-10-CM

## 2017-04-07 DIAGNOSIS — F330 Major depressive disorder, recurrent, mild: Secondary | ICD-10-CM | POA: Diagnosis not present

## 2017-04-07 DIAGNOSIS — F418 Other specified anxiety disorders: Secondary | ICD-10-CM

## 2017-04-07 MED ORDER — RISPERIDONE 1 MG PO TABS: 1.0000 mg | ORAL_TABLET | Freq: Every day | ORAL | 2 refills | Status: DC

## 2017-04-07 MED ORDER — VENLAFAXINE HCL ER 150 MG PO CP24: 150.0000 mg | ORAL_CAPSULE | Freq: Two times a day (BID) | ORAL | 2 refills | Status: DC

## 2017-04-07 MED ORDER — ESCITALOPRAM OXALATE 20 MG PO TABS: 20.0000 mg | ORAL_TABLET | Freq: Every day | ORAL | 2 refills | Status: DC

## 2017-04-07 MED ORDER — ALPRAZOLAM 0.5 MG PO TABS: 0.5000 mg | ORAL_TABLET | Freq: Three times a day (TID) | ORAL | 2 refills | Status: DC | PRN
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
## 2017-04-16 ENCOUNTER — Ambulatory Visit (INDEPENDENT_AMBULATORY_CARE_PROVIDER_SITE_OTHER): Payer: Medicare HMO | Admitting: Licensed Clinical Social Worker

## 2017-04-16 DIAGNOSIS — F330 Major depressive disorder, recurrent, mild: Secondary | ICD-10-CM

## 2017-04-16 DIAGNOSIS — R69 Illness, unspecified: Secondary | ICD-10-CM | POA: Diagnosis not present

## 2017-04-16 NOTE — Progress Notes (Signed)
   THERAPIST PROGRESS NOTE  Session Time: 10:00 am-10:55 am  Participation Level: Active  Behavioral Response: CasualAlertDepressed  Type of Therapy: Individual Therapy  Treatment Goals addressed: Coping  Interventions: CBT and Solution Focused  Summary: Amy [name] is a 81 y.o. female who presents oriented x5 (person, place, situation, time and object), alert but distracted, appropriate grooming, appropriate dress, and cooperative to address symptoms of depression. Patient has a history of medical treatment for chronic pain including back, and breast cancer. Patient has a history of mental health treatment including outpatient therapy, medication management and was hospitalized over 20 years ago for mental health concerns. Patient denies symptoms of mania. She denies suicidal and homicidal ideations. Patient denies psychosis including auditory and visual hallucinations. Patient denies substance use. She is at low risk for lethality at this time. Patient's mood is impacted by her health issues.  Patient had an average score of 6.25 out of 10 on the Outcome Rating Scale which is an increase of 1.75. Patient reports continued chronic pain that makes it difficult to clean her home and do what she "should" do, which leads to feelings of depression which leads to lack of energy as well as motivation. Patient also noted that she has a strained relationship with her daughter in law and doesn't like to ask her son to help her too much due her daughter in law getting upset. Patient noted she has "bad thoughts" about "giving up" but knows she would never act on them. Patient's depressed mood this week was triggered by not hearing back from her doctor about getting shots that could help with the chronic pain. Patient committed to continue to focus on what she can do and break up her activities into manageable portions. Patient rated session 10 out of 10.  Patient engaged in session. She responded well  to interventions. Patient continues to meet criteria for Major depressive disorder, recurrent episode, mild with anxious distress. Patient will continue in outpatient therapy due to being the least restrictive service to meet her needs at this time. Patient made minimal  progress on her goals at this time.  Suicidal/Homicidal: Negativewithout intent/plan  Therapist Response: Therapist reviewed patient's recent thoughts and behaviors. Therapist utilized CBT to address mood. Therapist processed patient's mood to identify triggers. Therapist assisted patient in identify what she was capable of. Therapist discussed the connection between physical pain and depressed mood. Therapist committed patient to continue to focus on what she can do and break up her activities into manageable portions.  Therapist administered the Outcome Rating Scale and the Session Rating Scale.   Plan: Return again in 2-3 weeks. Therapist will review patient goals on or before 10.03.2018.  Diagnosis: Axis I: Major depressive disorder, recurrent episode, mild with anxious distress    Axis II: No diagnosis    Glori Bickers, LCSW 04/16/2017

## 2017-04-17 ENCOUNTER — Other Ambulatory Visit: Payer: Self-pay | Admitting: Pulmonary Disease

## 2017-04-17 DIAGNOSIS — M545 Low back pain, unspecified: Secondary | ICD-10-CM

## 2017-04-17 DIAGNOSIS — G8929 Other chronic pain: Secondary | ICD-10-CM

## 2017-04-18 ENCOUNTER — Ambulatory Visit (HOSPITAL_COMMUNITY): Payer: Self-pay | Admitting: Adult Health

## 2017-04-18 ENCOUNTER — Other Ambulatory Visit (HOSPITAL_COMMUNITY): Payer: Self-pay

## 2017-04-29 ENCOUNTER — Encounter (HOSPITAL_COMMUNITY): Payer: Self-pay | Admitting: Adult Health

## 2017-04-29 ENCOUNTER — Encounter (HOSPITAL_COMMUNITY): Payer: Medicare HMO | Attending: Oncology

## 2017-04-29 ENCOUNTER — Encounter (HOSPITAL_BASED_OUTPATIENT_CLINIC_OR_DEPARTMENT_OTHER): Payer: Medicare HMO | Admitting: Adult Health

## 2017-04-29 VITALS — BP 136/99 | HR 87 | Resp 18 | Ht 64.0 in | Wt 167.0 lb

## 2017-04-29 DIAGNOSIS — F329 Major depressive disorder, single episode, unspecified: Secondary | ICD-10-CM

## 2017-04-29 DIAGNOSIS — C50911 Malignant neoplasm of unspecified site of right female breast: Secondary | ICD-10-CM | POA: Diagnosis not present

## 2017-04-29 DIAGNOSIS — M818 Other osteoporosis without current pathological fracture: Secondary | ICD-10-CM

## 2017-04-29 DIAGNOSIS — R69 Illness, unspecified: Secondary | ICD-10-CM | POA: Diagnosis not present

## 2017-04-29 DIAGNOSIS — M2550 Pain in unspecified joint: Secondary | ICD-10-CM | POA: Diagnosis not present

## 2017-04-29 DIAGNOSIS — M8580 Other specified disorders of bone density and structure, unspecified site: Secondary | ICD-10-CM | POA: Diagnosis not present

## 2017-04-29 DIAGNOSIS — F32A Depression, unspecified: Secondary | ICD-10-CM

## 2017-04-29 LAB — COMPREHENSIVE METABOLIC PANEL
ALT: 58 U/L — ABNORMAL HIGH (ref 14–54)
AST: 18 U/L (ref 15–41)
Albumin: 3.9 g/dL (ref 3.5–5.0)
Alkaline Phosphatase: 80 U/L (ref 38–126)
Anion gap: 8 (ref 5–15)
BUN: 16 mg/dL (ref 6–20)
CO2: 27 mmol/L (ref 22–32)
Calcium: 9 mg/dL (ref 8.9–10.3)
Chloride: 101 mmol/L (ref 101–111)
Creatinine, Ser: 0.9 mg/dL (ref 0.44–1.00)
GFR calc Af Amer: >60 mL/min (ref >60)
GFR calc non Af Amer: 59 mL/min — ABNORMAL LOW (ref >60)
Glucose, Bld: 123 mg/dL — ABNORMAL HIGH (ref 65–99)
Potassium: 4.3 mmol/L (ref 3.5–5.1)
Sodium: 136 mmol/L (ref 135–145)
Total Bilirubin: 0.6 mg/dL (ref 0.3–1.2)
Total Protein: 6.6 g/dL (ref 6.5–8.1)

## 2017-04-29 LAB — CBC WITH DIFFERENTIAL/PLATELET
Basophils Absolute: 0 10*3/uL (ref 0.0–0.1)
Basophils Relative: 1 %
Eosinophils Absolute: 0.2 10*3/uL (ref 0.0–0.7)
Eosinophils Relative: 4 %
HCT: 39.4 % (ref 36.0–46.0)
Hemoglobin: 13.1 g/dL (ref 12.0–15.0)
Lymphocytes Relative: 25 %
Lymphs Abs: 1.5 10*3/uL (ref 0.7–4.0)
MCH: 30.4 pg (ref 26.0–34.0)
MCHC: 33.2 g/dL (ref 30.0–36.0)
MCV: 91.4 fL (ref 78.0–100.0)
Monocytes Absolute: 0.5 10*3/uL (ref 0.1–1.0)
Monocytes Relative: 9 %
Neutro Abs: 3.8 10*3/uL (ref 1.7–7.7)
Neutrophils Relative %: 61 %
Platelets: 228 10*3/uL (ref 150–400)
RBC: 4.31 MIL/uL (ref 3.87–5.11)
RDW: 13.2 % (ref 11.5–15.5)
WBC: 6.1 10*3/uL (ref 4.0–10.5)

## 2017-04-29 NOTE — Patient Instructions (Addendum)
[place] at [facility] Discharge Instructions  RECOMMENDATIONS MADE BY THE CONSULTANT AND ANY TEST RESULTS WILL BE SENT TO YOUR REFERRING PHYSICIAN.  You were seen today by Mike Craze NP. STOP TAKING ANASTROZOLE/ARIMIDEX TODAY, DO NOT TAKE FOR 5 WEEKS. Return in 5 weeks for follow up with Elzie Rings.   Thank you for choosing [place] at [facility] to provide your oncology and hematology care.  To afford each patient quality time with our provider, please arrive at least 15 minutes before your scheduled appointment time.    If you have a lab appointment with the [facility] please come in thru the  Main Entrance and check in at the main information desk  You need to re-schedule your appointment should you arrive 10 or more minutes late.  We strive to give you quality time with our providers, and arriving late affects you and other patients whose appointments are after yours.  Also, if you no show three or more times for appointments you may be dismissed from the clinic at the providers discretion.     Again, thank you for choosing [facility].  Our hope is that these requests will decrease the amount of time that you wait before being seen by our physicians.       _____________________________________________________________  Should you have questions after your visit to [facility], please contact our office at [phone] between the hours of 8:30 a.m. and 4:30 p.m.  Voicemails left after 4:30 p.m. will not be returned until the following business day.  For prescription refill requests, have your pharmacy contact our office.       Resources For Cancer Patients and their Caregivers ? American Cancer Society: Can assist with transportation, wigs, general needs, runs Look Good Feel Better.        (231)607-5194 ? Cancer Care: Provides financial assistance, online support groups, medication/co-pay  assistance.  1-800-813-HOPE (260)663-5197) ? [place] Assists [place] cancer patients and their families through emotional , educational and financial support.  (646)266-4515 ? [place] DSS Where to apply for food stamps, Medicaid and utility assistance. 540-857-1539 ? [facility]: Transportation to medical appointments. 413-105-8189 ? Social Security Administration: May apply for disability if have a Stage IV cancer. 279-435-3915 616 560 2479 ? [employer], Disability and Transit Services: Assists with nutrition, care and transit needs. [place] Support Programs: @10RELATIVEDAYS @ > Cancer Support Group  2nd Tuesday of the month 1pm-2pm, Journey Room  > Creative Journey  3rd Tuesday of the month 1130am-1pm, Journey Room  > Look Good Feel Better  1st Wednesday of the month 10am-12 noon, Journey Room (Call [facility] to register [phone])

## 2017-04-30 ENCOUNTER — Ambulatory Visit
Admission: RE | Admit: 2017-04-30 | Discharge: 2017-04-30 | Disposition: A | Discharge status: Home / Self Care | Payer: Medicare HMO | Source: Ambulatory Visit | Attending: Pulmonary Disease | Admitting: Pulmonary Disease

## 2017-04-30 DIAGNOSIS — M545 Low back pain, unspecified: Secondary | ICD-10-CM

## 2017-04-30 DIAGNOSIS — G8929 Other chronic pain: Secondary | ICD-10-CM

## 2017-04-30 MED ORDER — IOPAMIDOL (ISOVUE-M 200) INJECTION 41%
1.0000 mL | Freq: Once | INTRAMUSCULAR | Status: AC
  Administered 2017-04-30: 1 mL via EPIDURAL

## 2017-04-30 MED ORDER — METHYLPREDNISOLONE ACETATE 40 MG/ML INJ SUSP (RADIOLOG
120.0000 mg | Freq: Once | INTRAMUSCULAR | Status: AC
  Administered 2017-04-30: 120 mg via EPIDURAL

## 2017-04-30 NOTE — Discharge Instructions (Signed)

## 2017-04-30 NOTE — Progress Notes (Signed)
[address], [place] [address]   CLINIC:  Medical Oncology/Hematology  PCP:  Sinda Du, MD [address] [address] [phone]   REASON FOR VISIT:  Follow-up for Stage IA invasive ductal carcinoma of (R) breast; ER+/PR+/HER2-  CURRENT THERAPY: Arimidex daily, since 10/23/16   BRIEF ONCOLOGIC HISTORY:    Invasive ductal carcinoma of breast, female, right ([place])   10/09/2016 Procedure    Left breast lumpectomy by Dr. Arnoldo Morale      10/16/2016 Pathology Results    Invasive, grade 1, ductal carcinoma, spanning 0.18 cmIn greatest dimension.  Low and intermediate grade DCIS with associated calcifications.  Intraductal papilloma formation with associated low-grade DCIS.  DCIS is less than 0.1 cm from the margin in multiple areas including intermediate ductal carcinoma in situ adjacent to cauterized edge.       10/16/2016 Pathology Results    ER +100%, PR +90%, HER-2 negative.      10/23/2016 Cancer Staging    Cancer Staging Invasive ductal carcinoma of breast, female, right [facility]) Staging form: Breast, AJCC 8th Edition - Pathologic stage from 10/23/2016: Stage Unknown (pT1a, pNX, cM0, G1, ER: Positive, PR: Positive, HER2: Negative) - Signed by Baird Cancer, PA-C on 10/23/2016       11/14/2016 Imaging    CT CAP-  1. No compelling findings of metastatic breast cancer. 2. Acute mild diverticulitis of the descending colon. 3. Further distally in the descending colon but there is a cluster of small lymph nodes adjacent to the colon. Although possibly related to the diverticulitis this is a bit distend and this type of clustered nodes is unusual in this location. Following resolution of the acute process, consider colonoscopy to exclude an underlying occult cancer. 4. 3.7 cm fatty mass along the lumen of the descending colon favors a lipoma or fatty polyp. 5. Aortic Atherosclerosis (ICD10-I70.0). 6. Minimal  tree-in-bud reticulonodular opacities in the left lower lobe characteristic for atypical infectious bronchiolitis. 7. Left groin hernia containing adipose tissue, probably a direct inguinal hernia. 8. Exaggerated lumbar lordosis with grade 1 degenerative anterolisthesis at L4-5.      11/26/2016 Imaging    Bone density- BMD as determined from Femur Neck Left is 0.820 g/cm2 with a T-Score of -1.6.  This patient is considered OSTEOPENIC according to [facility]) criteria.          INTERVAL HISTORY:  Ms. Schnieders 81 y.o. female returns for routine follow-up for right breast cancer.   She is here alone today. Overall, she tells me she has not been feeling great, which she attributes to her pain, particularly to her back, hips, & buttocks.  She is scheduled to have another injection at [facility], which she hopes will help her pain. MRI spine in 01/2017 revealed no evidence of metastatic breast cancer, but did show progressive/severe spinal stenosis with compression, as well as progressive/severe bilateral facet arthritis with increased lumbar scoliosis.   Denies any breast concerns including lumps, nipple discharge, or skin changes to breasts. Remains on Arimidex.  It is difficult for her to tell me if her joint pain is worse since starting Arimidex about 6 months ago "but I think it might be worse."  Denies any hot flashes or vaginal dryness.   She continues to struggle with depression.  She sees Dr. [name] and a counselor with behavioral health; she tells me that she feels like she is "better than I used to be many  years ago when I was in a really bad place, but I'm still not good."  Endorses feeling hopeless at times.  She has suicidal thoughts but they are not constant; reports that she occasionally considers ways she would kill herself including hanging, taking rat poison, or taking other pills.  She denies any active plans of suicide; denies any thoughts  or plans of homicide.  She feels like her chronic pain and not being able to be as active dramatically affects her mood.  She has strong family history of suicide in her family "so it's something I know I'm at risk for."  Again denies any active plans of suicide; does report that she feels hopeful for the future. "I have 3 grandsons, and 2 of them are in college and I want to be around for them."    Other symptoms she reports today, which she understands are not likely related to her history of breast cancer include: worsening fatigue, constipation, shortness of breath, urinary problems, intermittent night sweats (denies drenching night sweats or hot flashes), weakness, peripheral neuropathy to feet, and sleep problems.      REVIEW OF SYSTEMS:  Review of Systems - Oncology, per HPI    PAST MEDICAL/SURGICAL HISTORY:  Past Medical History:  Diagnosis Date  . Anxiety   . Arthritis   . Back pain   . Colon polyps   . Depression   . Diabetes mellitus, type II ([place])    diet controlled  . Hypercholesterolemia   . Invasive ductal carcinoma of breast, female, right ([place])   . Osteopenia 11/26/2016  . Skin-picking disorder    has 3 open wounds on right wrist that are being treated. About a quarter in size.   Past Surgical History:  Procedure Laterality Date  . APPENDECTOMY    . BREAST BIOPSY Right 10/09/2016   Procedure: BREAST BIOPSY WITH NEEDLE LOCALIZATION;  Surgeon: [name], MD;  Location: [facility];  Service: General;  Laterality: Right;  . BREAST SURGERY Right   . BUNIONECTOMY Bilateral   . FACIAL COSMETIC SURGERY     drooping eyelid and brow  . HERNIA REPAIR Right    Inguinal x2  . KNEE ARTHROSCOPY Right   . SHOULDER SURGERY Right    rotator cuff repair and removal of bone spur  . TEMPOROMANDIBULAR JOINT SURGERY Right      SOCIAL HISTORY:  Social History   Social History  . Marital status: Divorced    Spouse name: N/A  . Number of children: N/A  . Years of education: N/A     Occupational History  . Not on file.   Social History Main Topics  . Smoking status: Former Smoker    Packs/day: 0.50    Years: 40.00    Types: Cigarettes    Quit date: 09/04/1995  . Smokeless tobacco: Never Used  . Alcohol use No  . Drug use: No  . Sexual activity: No   Other Topics Concern  . Not on file   Social History Narrative  . No narrative on file    FAMILY HISTORY:  Family History  Problem Relation Age of Onset  . Anxiety disorder Mother   . Depression Mother   . Depression Maternal Grandmother   . Alcohol abuse Brother   . Cancer - Other Sister   . ADD / ADHD Neg Hx   . Bipolar disorder Neg Hx   . Dementia Neg Hx   . Drug abuse Neg Hx   . OCD Neg Hx   .  Paranoid behavior Neg Hx   . Schizophrenia Neg Hx   . Seizures Neg Hx   . Sexual abuse Neg Hx   . Physical abuse Neg Hx     CURRENT MEDICATIONS:  Outpatient Encounter Prescriptions as of 04/29/2017  Medication Sig  . ALPRAZolam (XANAX) 0.5 MG tablet Take 1 tablet (0.5 mg total) by mouth 3 (three) times daily as needed for anxiety.  Marland Kitchen anastrozole (ARIMIDEX) 1 MG tablet take 1 tablet by mouth once daily  . Calcium Carb-Cholecalciferol (OYSTER CALCIUM/D3) 500-200 MG-UNIT TABS Take 2 tablets by mouth daily.  . cholecalciferol (VITAMIN D) 1000 units tablet Take 1 tablet (1,000 Units total) by mouth daily. (Patient taking differently: Take 2,000 Units by mouth daily. )  . docusate sodium (COLACE) 100 MG capsule Take 100 mg by mouth as needed for mild constipation.  Marland Kitchen escitalopram (LEXAPRO) 20 MG tablet Take 1 tablet (20 mg total) by mouth daily.  . metFORMIN (GLUCOPHAGE) 500 MG tablet Take 500 mg by mouth 2 (two) times daily with a meal.   . oxyCODONE-acetaminophen (PERCOCET/ROXICET) 5-325 MG tablet Take 1 tablet by mouth every 6 (six) hours as needed for moderate pain or severe pain.  Marland Kitchen risperiDONE (RISPERDAL) 1 MG tablet Take 1 tablet (1 mg total) by mouth at bedtime.  . valACYclovir (VALTREX) 500 MG tablet  Take 1,000 mg by mouth See admin instructions. During outbreak - pt takes 2 tabs for 3 days   . venlafaxine XR (EFFEXOR XR) 150 MG 24 hr capsule Take 1 capsule (150 mg total) by mouth 2 (two) times daily.  . [DISCONTINUED] cyanocobalamin 1000 MCG tablet Take 1,000 mcg by mouth daily.   No facility-administered encounter medications on file as of 04/29/2017.     ALLERGIES:  Allergies  Allergen Reactions  . Cymbalta [Duloxetine Hcl] Other (See Comments)    Hallucinated and couldn't think; got worse and worse the longer she took this.     PHYSICAL EXAM:  ECOG Performance status: 2 - Symptomatic; requires occasional assistance   Vitals:   04/29/17 1003  BP: (!) 136/99  Pulse: 87  Resp: 18  SpO2: 96%   Filed Weights   04/29/17 1003  Weight: 167 lb (75.8 kg)    Physical Exam  Constitutional: She is oriented to person, place, and time.  Chronically-ill appearing female in no acute distress   HENT:  Head: Normocephalic.  Mouth/Throat: Oropharynx is clear and moist. No oropharyngeal exudate.  Eyes: Pupils are equal, round, and reactive to light. Conjunctivae are normal. No scleral icterus.  Neck: Normal range of motion. Neck supple.  Cardiovascular: Normal rate and regular rhythm.   Pulmonary/Chest: Effort normal and breath sounds normal. No respiratory distress.    Abdominal: Soft. Bowel sounds are normal. There is no tenderness.  Musculoskeletal: She exhibits no edema.  Limited back, legs/hips ROM  Lymphadenopathy:    She has no cervical adenopathy.  Neurological: She is alert and oriented to person, place, and time.  Skin: Skin is warm and dry. No rash noted.  Psychiatric: Judgment normal.  -Mildly flat affect  -Slightly slow to respond to questions at times and states, "See, my thinking is off."   Nursing note and vitals reviewed.    LABORATORY DATA:  I have reviewed the labs as listed.  CBC    Component Value Date/Time   WBC 6.1 04/29/2017 0919   RBC 4.31  04/29/2017 0919   HGB 13.1 04/29/2017 0919   HCT 39.4 04/29/2017 0919   PLT 228 04/29/2017 0919  MCV 91.4 04/29/2017 0919   MCH 30.4 04/29/2017 0919   MCHC 33.2 04/29/2017 0919   RDW 13.2 04/29/2017 0919   LYMPHSABS 1.5 04/29/2017 0919   MONOABS 0.5 04/29/2017 0919   EOSABS 0.2 04/29/2017 0919   BASOSABS 0.0 04/29/2017 0919   CMP Latest Ref Rng & Units 04/29/2017 12/19/2016 10/23/2016  Glucose 65 - 99 mg/dL 123(H) 127(H) 93  BUN 6 - 20 mg/dL 16 14 21(H)  Creatinine 0.44 - 1.00 mg/dL 0.90 0.79 0.99  Sodium 135 - 145 mmol/L 136 137 133(L)  Potassium 3.5 - 5.1 mmol/L 4.3 4.2 4.3  Chloride 101 - 111 mmol/L 101 101 102  CO2 22 - 32 mmol/L '27 29 26  '$ Calcium 8.9 - 10.3 mg/dL 9.0 8.9 8.8(L)  Total Protein 6.5 - 8.1 g/dL 6.6 6.5 6.8  Total Bilirubin 0.3 - 1.2 mg/dL 0.6 0.2(L) 0.4  Alkaline Phos 38 - 126 U/L 80 65 58  AST 15 - 41 U/L '18 18 15  '$ ALT 14 - 54 U/L 58(H) 10(L) 11(L)    PENDING LABS:    DIAGNOSTIC IMAGING:    PATHOLOGY:  (L) breast lumpectomy surgical path: 10/09/16           ASSESSMENT & PLAN:   Stage IA invasive ductal carcinoma of (R) breast; ER+/PR+/HER2-:  -Diagnosed in 09/2016. Treated with (L) lumpectomy by Dr. Arnoldo Morale. No adjuvant radiation recommended. Started anti-estrogen therapy with Arimidex in late 09/2016.  -Clinical breast exam performed today and negative for recurrent disease.  -Mammogram will be due in 09/2017; will place orders at subsequent follow-up visit.  -Remains on Arimidex with significant arthralgias. I have low suspicion that the Arimidex is causing the biggest majority of her joint and back pain after reviewing her MRI, which showed significant disc/nerve disease. However, it could certainly be contributing to her overall pain.  Discussed the risks/benefits of a "drug holiday" from Arimidex for 4-6 weeks to see if her symptoms improve.  If bone pain improves, then either her orthopedic interventions have been helpful and/or Arimidex was  indeed making her symptoms worse.  Shared with her that it is not uncommon for patients to undergo "drug holiday" when side effects become severe.  Since it is difficult for her to ascertain if the Arimidex is contributing to her pain, and I suspect may affect compliance with her anti-estrogen therapy in the future, we will hold Arimidex for 4-6 weeks. She is enthusiastic about this plan.   -Will plan to follow-up with her in ~5 weeks here at the cancer center.  If her pain does not improve, then we can confidently say that Arimidex was not the main source of her pain.  If her pain improves, then we can consider trying letrozole or exemestane for her anti-estrogen therapy at that time.   -Return to cancer center in ~5 weeks.   Bone health:  -Last DEXA scan 11/25/16; osteopenia with T-score -1.6.  -Continue Prolia inj every 6 months.  -Continue calcium/vitamin D supplementation and weight-bearing exercises (which is difficult given her chronic pain).   Depression:  -Endorses having periods of hopelessness with suicidal ideation. She has considered plans for suicide in the past, but no active plan to harm herself or others.  She is able to share with me that she does have hope for the future and her children/grandchildren provide her joy and hope.   -Provided support today through active listening, validation of her concerns, and expressive supportive counseling.   -Recommended continued close follow-up with behavioral health team (  MD and counselor).       Dispo:  -Return to cancer center in ~5 weeks for follow-up after "drug holiday" from Arimidex.    All questions were answered to patient's stated satisfaction. Encouraged patient to call with any new concerns or questions before her next visit to the cancer center and we can certain see her sooner, if needed.    Plan of care discussed with Dr. Talbert Cage, who agrees with the above aforementioned.    A total of 35 minutes was spent in face-to-face  care of this patient, with greater than 50% of that time spent in counseling and care-coordination.    Orders placed this encounter:  No orders of the defined types were placed in this encounter.     Mike Craze, NP [place] [phone]

## 2017-05-06 ENCOUNTER — Ambulatory Visit (INDEPENDENT_AMBULATORY_CARE_PROVIDER_SITE_OTHER): Payer: Medicare HMO | Admitting: Licensed Clinical Social Worker

## 2017-05-06 DIAGNOSIS — R69 Illness, unspecified: Secondary | ICD-10-CM | POA: Diagnosis not present

## 2017-05-06 DIAGNOSIS — F330 Major depressive disorder, recurrent, mild: Secondary | ICD-10-CM | POA: Diagnosis not present

## 2017-05-07 NOTE — Progress Notes (Signed)
   THERAPIST PROGRESS NOTE  Session Time: 1:00 pm-1:55 pm  Participation Level: Active  Behavioral Response: CasualAlertDepressed  Type of Therapy: Individual Therapy  Treatment Goals addressed: Coping  Interventions: CBT and Solution Focused  Summary: Amy [name] is a 81 y.o. female who presents oriented x5 (person, place, situation, time and object), alert but distracted, appropriate grooming, appropriate dress, and cooperative to address symptoms of depression. Patient has a history of medical treatment for chronic pain including back, and breast cancer. Patient has a history of mental health treatment including outpatient therapy, medication management and was hospitalized over 20 years ago for mental health concerns. Patient denies symptoms of mania. She denies suicidal and homicidal ideations. Patient denies psychosis including auditory and visual hallucinations. Patient denies substance use. She is at low risk for lethality at this time. Patient's mood is impacted by her health issues.  Patient had an average score of 7.50 out of 10 on the Outcome Rating Scale. Patient reported that she has been feeling better physically which has improved her mood. Patient reported that she asked her family for help in cleaning her home and she got it cleaned to a point where she is satisfied. Patient also explained that she is having people look at her home to possibly buy it and she is looking for something smaller and manageable. Patient admitted that she is still feeling physically weak but she also is not eating regularly.  Patient  Patient rated session 10 out of 10.  Patient engaged in session. She responded well to interventions. Patient continues to meet criteria for Major depressive disorder, recurrent episode, mild with anxious distress. Patient will continue in outpatient therapy due to being the least restrictive service to meet her needs at this time. Patient made moderate progress on  her goals at this time.  Suicidal/Homicidal: Negativewithout intent/plan  Therapist Response: Therapist reviewed patient's recent thoughts and behaviors. Therapist utilized CBT to address mood. Therapist processed patient's mood to identify triggers. Therapist praised patient for asking for help. Therapist discussed with patient ways she can manage her mood. Therapist committed patient to be active but manage her mood by not over doing it.  Therapist administered the Outcome Rating Scale and the Session Rating Scale.   Plan: Return again in 2-3 weeks. Therapist will review patient goals on or before 10.03.2018.  Diagnosis: Axis I: Major depressive disorder, recurrent episode, mild with anxious distress    Axis II: No diagnosis    Glori Bickers, LCSW 05/07/2017

## 2017-05-26 ENCOUNTER — Ambulatory Visit (HOSPITAL_COMMUNITY): Payer: Self-pay | Admitting: Licensed Clinical Social Worker

## 2017-05-27 DIAGNOSIS — E1142 Type 2 diabetes mellitus with diabetic polyneuropathy: Secondary | ICD-10-CM | POA: Diagnosis not present

## 2017-05-27 DIAGNOSIS — M159 Polyosteoarthritis, unspecified: Secondary | ICD-10-CM | POA: Diagnosis not present

## 2017-05-27 DIAGNOSIS — M25559 Pain in unspecified hip: Secondary | ICD-10-CM | POA: Diagnosis not present

## 2017-05-27 DIAGNOSIS — E119 Type 2 diabetes mellitus without complications: Secondary | ICD-10-CM | POA: Diagnosis not present

## 2017-05-27 DIAGNOSIS — M545 Low back pain: Secondary | ICD-10-CM | POA: Diagnosis not present

## 2017-05-27 DIAGNOSIS — B351 Tinea unguium: Secondary | ICD-10-CM | POA: Diagnosis not present

## 2017-05-29 ENCOUNTER — Other Ambulatory Visit (HOSPITAL_COMMUNITY): Payer: Self-pay | Admitting: Pulmonary Disease

## 2017-05-29 ENCOUNTER — Ambulatory Visit (HOSPITAL_COMMUNITY)
Admission: RE | Admit: 2017-05-29 | Discharge: 2017-05-29 | Disposition: A | Discharge status: Home / Self Care | Payer: Medicare HMO | Source: Ambulatory Visit | Attending: Pulmonary Disease | Admitting: Pulmonary Disease

## 2017-05-29 ENCOUNTER — Telehealth (HOSPITAL_COMMUNITY): Payer: Self-pay | Admitting: *Deleted

## 2017-05-29 DIAGNOSIS — M160 Bilateral primary osteoarthritis of hip: Secondary | ICD-10-CM | POA: Diagnosis not present

## 2017-05-29 DIAGNOSIS — M159 Polyosteoarthritis, unspecified: Secondary | ICD-10-CM | POA: Diagnosis not present

## 2017-05-29 DIAGNOSIS — M25559 Pain in unspecified hip: Secondary | ICD-10-CM

## 2017-05-29 DIAGNOSIS — R52 Pain, unspecified: Secondary | ICD-10-CM

## 2017-05-29 DIAGNOSIS — M545 Low back pain: Secondary | ICD-10-CM | POA: Diagnosis not present

## 2017-05-29 DIAGNOSIS — I10 Essential (primary) hypertension: Secondary | ICD-10-CM | POA: Diagnosis not present

## 2017-05-29 DIAGNOSIS — E119 Type 2 diabetes mellitus without complications: Secondary | ICD-10-CM | POA: Diagnosis not present

## 2017-05-29 DIAGNOSIS — M47896 Other spondylosis, lumbar region: Secondary | ICD-10-CM | POA: Insufficient documentation

## 2017-05-29 NOTE — Telephone Encounter (Signed)
returned phone call regarding an appointment, left voice message.

## 2017-05-30 DIAGNOSIS — L851 Acquired keratosis [keratoderma] palmaris et plantaris: Secondary | ICD-10-CM | POA: Diagnosis not present

## 2017-05-30 DIAGNOSIS — E1142 Type 2 diabetes mellitus with diabetic polyneuropathy: Secondary | ICD-10-CM | POA: Diagnosis not present

## 2017-06-04 ENCOUNTER — Other Ambulatory Visit (HOSPITAL_COMMUNITY): Payer: Self-pay

## 2017-06-04 DIAGNOSIS — C50911 Malignant neoplasm of unspecified site of right female breast: Secondary | ICD-10-CM

## 2017-06-04 NOTE — Progress Notes (Signed)
Amy [name], Amy [name] [id number]   CLINIC:  Medical Oncology/Hematology  PCP:  Amy Du, MD [place] [address] [phone]   REASON FOR VISIT:  Follow-up for Stage IA invasive ductal carcinoma of (R) breast; ER+/PR+/HER2-  CURRENT THERAPY: Arimidex daily, since 10/23/16 ("drug holiday" from 04/29/17-present)   BRIEF ONCOLOGIC HISTORY:    Invasive ductal carcinoma of breast, female, right ([place])   10/09/2016 Procedure    Left breast lumpectomy by Dr. Arnoldo [name]      10/16/2016 Pathology Results    Invasive, grade 1, ductal carcinoma, spanning 0.18 cmIn greatest dimension.  Low and intermediate grade DCIS with associated calcifications.  Intraductal papilloma formation with associated low-grade DCIS.  DCIS is less than 0.1 cm from the margin in multiple areas including intermediate ductal carcinoma in situ adjacent to cauterized edge.       10/16/2016 Pathology Results    ER +100%, PR +90%, HER-2 negative.      10/23/2016 Cancer Staging    Cancer Staging Invasive ductal carcinoma of breast, female, right [facility]) Staging form: Breast, AJCC 8th Edition - Pathologic stage from 10/23/2016: Stage Unknown (pT1a, pNX, cM0, G1, ER: Positive, PR: Positive, HER2: Negative) - Signed by Baird Cancer, PA-C on 10/23/2016       11/14/2016 Imaging    CT CAP-  1. No compelling findings of metastatic breast cancer. 2. Acute mild diverticulitis of the descending colon. 3. Further distally in the descending colon but there is a cluster of small lymph nodes adjacent to the colon. Although possibly related to the diverticulitis this is a bit distend and this type of clustered nodes is unusual in this location. Following resolution of the acute process, consider colonoscopy to exclude an underlying occult cancer. 4. 3.7 cm fatty mass along the lumen of the descending colon favors a lipoma or fatty polyp. 5. Aortic  Atherosclerosis (ICD10-I70.0). 6. Minimal tree-in-bud reticulonodular opacities in the left lower lobe characteristic for atypical infectious bronchiolitis. 7. Left groin hernia containing adipose tissue, probably a direct inguinal hernia. 8. Exaggerated lumbar lordosis with grade 1 degenerative anterolisthesis at L4-5.      11/26/2016 Imaging    Bone density- BMD as determined from Fem[facility] Neck Left is 0.820 g/cm2 with a T-Score of -1.6.  This patient is considered OSTEOPENIC according to [facility]) criteria.          INTERVAL HISTORY:  [name] 81 y.o. female returns for routine follow-up for right breast cancer.   At her last visit, we stopped her Anastrozole for short-interval "drug holiday" for significant arthralgias.  She thinks that stopping the anastrozole has helped "a little bit" in terms of her bone pain.  Admittedly, she does share that it is hard to tell because of her co-existing degenerative disc/bone disease.  She wants to talk about her options in terms of anti-estrogen medications.    Appetite 75%; energy levels 25%. Continues to have significant pain to her left hip and back.  She has chronic bladder incontinence, constipation, rash/itching to her arms, and some shortness of breath with exertion.    She continues to see behavioral health/counselor for her depression.  She feels like her mood is overall stable.    She shares with me that she recently had an X-ray on 05/29/17, but has not received those results from her PCP yet.   Her son is active in her care.  She is unaccompanied today.  REVIEW OF SYSTEMS:  Review of Systems  Constitutional: Positive for fatigue.  HENT:  Negative.   Eyes: Negative.   Respiratory: Positive for shortness of breath (with exertion).   Cardiovascular: Negative.   Gastrointestinal: Positive for constipation.  Endocrine: Negative.   Genitourinary: Positive for bladder incontinence.     Musculoskeletal: Positive for arthralgias and back pain.  Skin: Positive for itching and rash.  Neurological: Negative.   Hematological: Negative.   Psychiatric/Behavioral: Negative.       PAST MEDICAL/SURGICAL HISTORY:  Past Medical History:  Diagnosis Date  . Anxiety   . Arthritis   . Back pain   . Colon polyps   . Depression   . Diabetes mellitus, type II ([place])    diet controlled  . Hypercholesterolemia   . Invasive ductal carcinoma of breast, female, right ([place])   . Osteopenia 11/26/2016  . Skin-picking disorder    has 3 open wounds on right wrist that are being treated. About a quarter in size.   Past Surgical History:  Procedure Laterality Date  . APPENDECTOMY    . BREAST SURGERY Right   . BUNIONECTOMY Bilateral   . FACIAL COSMETIC SURGERY     drooping eyelid and brow  . HERNIA REPAIR Right    Inguinal x2  . KNEE ARTHROSCOPY Right   . SHOULDER SURGERY Right    rotator cuff repair and removal of bone spur  . TEMPOROMANDIBULAR JOINT SURGERY Right      SOCIAL HISTORY:  Social History   Socioeconomic History  . Marital status: Divorced    Spouse name: Not on file  . Number of children: Not on file  . Years of education: Not on file  . Highest education level: Not on file  Social Needs  . Financial resource strain: Not hard at all  . Food insecurity - worry: Never true  . Food insecurity - inability: Never true  . Transportation needs - medical: No  . Transportation needs - non-medical: No  Occupational History  . Occupation: Nurse    Comment: Retired  Tobacco Use  . Smoking status: Former Smoker    Packs/day: 0.50    Years: 40.00    Pack years: 20.00    Types: Cigarettes    Last attempt to quit: 09/04/1995    Years since quitting: 21.7  . Smokeless tobacco: Never Used  Substance and Sexual Activity  . Alcohol use: No  . Drug use: No  . Sexual activity: No  Other Topics Concern  . Not on file  Social History Narrative  . Not on file     FAMILY HISTORY:  Family History  Problem Relation Age of Onset  . Anxiety disorder Mother   . Depression Mother   . Depression Maternal Grandmother   . Alcohol abuse Brother   . Cancer - Other Sister   . ADD / ADHD Neg Hx   . Bipolar disorder Neg Hx   . Dementia Neg Hx   . Drug abuse Neg Hx   . OCD Neg Hx   . Paranoid behavior Neg Hx   . Schizophrenia Neg Hx   . Seizures Neg Hx   . Sexual abuse Neg Hx   . Physical abuse Neg Hx     CURRENT MEDICATIONS:  Outpatient Encounter Medications as of 06/05/2017  Medication Sig  . ALPRAZolam (XANAX) 0.5 MG tablet Take 1 tablet (0.5 mg total) by mouth 3 (three) times daily as needed for anxiety.  . Calcium Carb-Cholecalciferol (OYSTER CALCIUM/D3) 500-200 MG-UNIT TABS  Take 2 tablets by mouth daily.  . cholecalciferol (VITAMIN D) 1000 units tablet Take 1 tablet (1,000 Units total) by mouth daily. (Patient taking differently: Take 2,000 Units by mouth daily. )  . escitalopram (LEXAPRO) 20 MG tablet Take 1 tablet (20 mg total) by mouth daily.  . metFORMIN (GLUCOPHAGE) 500 MG tablet Take 500 mg by mouth 2 (two) times daily with a meal.   . oxyCODONE-acetaminophen (PERCOCET/ROXICET) 5-325 MG tablet Take 1 tablet by mouth every 6 (six) hours as needed for moderate pain or severe pain.  Marland Kitchen risperiDONE (RISPERDAL) 1 MG tablet Take 1 tablet (1 mg total) by mouth at bedtime.  Marland Kitchen venlafaxine XR (EFFEXOR XR) 150 MG 24 hr capsule Take 1 capsule (150 mg total) by mouth 2 (two) times daily.  Marland Kitchen docusate sodium (COLACE) 100 MG capsule Take 100 mg by mouth as needed for mild constipation.  Marland Kitchen letrozole (FEMARA) 2.5 MG tablet Take 1 tablet (2.5 mg total) daily by mouth.  . valACYclovir (VALTREX) 500 MG tablet Take 1,000 mg by mouth See admin instructions. During outbreak - pt takes 2 tabs for 3 days   . [DISCONTINUED] anastrozole (ARIMIDEX) 1 MG tablet take 1 tablet by mouth once daily (Patient not taking: Reported on 06/05/2017)   No facility-administered  encounter medications on file as of 06/05/2017.     ALLERGIES:  Allergies  Allergen Reactions  . Cymbalta [Duloxetine Hcl] Other (See Comments)    Hallucinated and couldn't think; got worse and worse the longer she took this.     PHYSICAL EXAM:  ECOG Performance status: 2 - Symptomatic; requires occasional assistance   Vitals:   06/05/17 1010  BP: (!) 151/70  Pulse: 95  Resp: 20  Temp: 97.6 F (36.4 C)  SpO2: 97%   Filed Weights   06/05/17 1010  Weight: 160 lb (72.6 kg)    Physical Exam  Constitutional: She is oriented to person, place, and time.  -Ambulates with walker Female in no acute distress  -Exam done with patient seated in chair given her significant back and hip pain.   HENT:  Head: Normocephalic.  Mouth/Throat: Oropharynx is clear and moist. No oropharyngeal exudate.  Eyes: Conjunctivae are normal. Pupils are equal, round, and reactive to light. No scleral icterus.  Neck: Normal range of motion. Neck supple.  Cardiovascular: Normal rate and regular rhythm.  Pulmonary/Chest: Effort normal and breath sounds normal. No respiratory distress. She has no wheezes.  Abdominal: Soft. Bowel sounds are normal. There is no tenderness.  Musculoskeletal: She exhibits no edema.  Ambulates with walker  Lymphadenopathy:    She has no cervical adenopathy.       Right: No supraclavicular adenopathy present.       Left: No supraclavicular adenopathy present.  Neurological: She is alert and oriented to person, place, and time. No cranial nerve deficit.  Skin: Skin is warm and dry. No rash noted.  Psychiatric: Mood, memory, affect and judgment normal.  Mood and affect much improved from previous visit   Nursing note and vitals reviewed.    LABORATORY DATA:  I have reviewed the labs as listed.  CBC    Component Value Date/Time   WBC 7.5 06/05/2017 0928   RBC 4.47 06/05/2017 0928   HGB 13.6 06/05/2017 0928   HCT 40.7 06/05/2017 0928   PLT 279 06/05/2017 0928   MCV  91.1 06/05/2017 0928   MCH 30.4 06/05/2017 0928   MCHC 33.4 06/05/2017 0928   RDW 12.9 06/05/2017 0928   LYMPHSABS 1.7  06/05/2017 0928   MONOABS 0.5 06/05/2017 0928   EOSABS 0.1 06/05/2017 0928   BASOSABS 0.0 06/05/2017 0928   CMP Latest Ref Rng & Units 06/05/2017 04/29/2017 12/19/2016  Glucose 65 - 99 mg/dL 131(H) 123(H) 127(H)  BUN 6 - 20 mg/dL '11 16 14  '$ Creatinine 0.44 - 1.00 mg/dL 0.77 0.90 0.79  Sodium 135 - 145 mmol/L 136 136 137  Potassium 3.5 - 5.1 mmol/L 4.3 4.3 4.2  Chloride 101 - 111 mmol/L 101 101 101  CO2 22 - 32 mmol/L '27 27 29  '$ Calcium 8.9 - 10.3 mg/dL 9.3 9.0 8.9  Total Protein 6.5 - 8.1 g/dL 6.9 6.6 6.5  Total Bilirubin 0.3 - 1.2 mg/dL 0.5 0.6 0.2(L)  Alkaline Phos 38 - 126 U/L 59 80 65  AST 15 - 41 U/L '20 18 18  '$ ALT 14 - 54 U/L 13(L) 58(H) 10(L)    PENDING LABS:    DIAGNOSTIC IMAGING:    PATHOLOGY:  (L) breast lumpectomy surgical path: 10/09/16              ASSESSMENT & PLAN:   Stage IA invasive ductal carcinoma of (R) breast; ER+/PR+/HER2-:  -Diagnosed in 09/2016. Treated with (L) lumpectomy by Dr. Arnoldo [name]. No adjuvant radiation recommended. Started anti-estrogen therapy with Arimidex in late 09/2016.  -Arimidex drug holiday from 04/29/17-today. Reports some symptomatic improvement in her symptoms, "but it's hard to tell."  I think it reasonable to stop Arimidex and try a different aromatase inhibitor. She is agreeable to this plan. She understands that all AIs carry the same risks/possible side effects, but she is willing to try a new medication.  Therefore, I have e-scribed Letrozole to her pharmacy. Gave her verbal and written instructions on how to take this medication. Encouraged her to call us if she has progressive symptoms in the coming weeks on this new medication. She agreed with this plan.   -Clinical breast exam deferred today as it was performed at visit ~1 month ago.  -Mammogram will be due in 09/2017; orders placed today.  -Return to  cancer center in ~4 months for follow-up after mammogram.   Bone health:  -Last DEXA scan 11/25/16; osteopenia with T-score -1.6.  Next DEXA scan will be due in 10/2018; will place orders at subsequent visits.  -Continue Prolia inj every 6 months. She will receive Prolia injection today as scheduled.  -Continue calcium/vitamin D supplementation and weight-bearing exercises (which is difficult given her chronic pain).   Arthralgias:  -Likely secondary to significant degenerative disc/bone disease.  Being managed by outside provider; defer additional management to them.    Depression:  -Continue follow-up with psychiatry/counselor as directed. Clinically, her mood and affect are both much improved from her last visit here at the cancer center.      Dispo:  -Mammogram due in 09/2017; orders placed today.  -Return to cancer center in ~4 months for follow-up after mammogram.     All questions were answered to patient's stated satisfaction. Encouraged patient to call with any new concerns or questions before her next visit to the cancer center and we can certain see her sooner, if needed.    Plan of care discussed with Dr. Talbert Cage, who agrees with the above aforementioned.     Orders placed this encounter:  Orders Placed This Encounter  Procedures  . MM DIAG BREAST TOMO BILATERAL      Mike Craze, NP [place] [phone]

## 2017-06-05 ENCOUNTER — Encounter (HOSPITAL_COMMUNITY): Payer: Medicare HMO

## 2017-06-05 ENCOUNTER — Encounter (HOSPITAL_COMMUNITY): Payer: Medicare HMO | Attending: Oncology

## 2017-06-05 ENCOUNTER — Encounter (HOSPITAL_BASED_OUTPATIENT_CLINIC_OR_DEPARTMENT_OTHER): Payer: Medicare HMO | Admitting: Adult Health

## 2017-06-05 ENCOUNTER — Other Ambulatory Visit (HOSPITAL_COMMUNITY): Payer: Self-pay

## 2017-06-05 ENCOUNTER — Encounter (HOSPITAL_COMMUNITY): Payer: Self-pay | Admitting: Adult Health

## 2017-06-05 ENCOUNTER — Other Ambulatory Visit: Payer: Self-pay

## 2017-06-05 VITALS — BP 151/70 | HR 95 | Temp 97.6°F | Resp 20 | Wt 160.0 lb

## 2017-06-05 DIAGNOSIS — K5900 Constipation, unspecified: Secondary | ICD-10-CM | POA: Diagnosis not present

## 2017-06-05 DIAGNOSIS — R0609 Other forms of dyspnea: Secondary | ICD-10-CM | POA: Diagnosis not present

## 2017-06-05 DIAGNOSIS — R21 Rash and other nonspecific skin eruption: Secondary | ICD-10-CM

## 2017-06-05 DIAGNOSIS — M545 Low back pain: Secondary | ICD-10-CM

## 2017-06-05 DIAGNOSIS — M2550 Pain in unspecified joint: Secondary | ICD-10-CM

## 2017-06-05 DIAGNOSIS — C50911 Malignant neoplasm of unspecified site of right female breast: Secondary | ICD-10-CM | POA: Insufficient documentation

## 2017-06-05 DIAGNOSIS — R69 Illness, unspecified: Secondary | ICD-10-CM | POA: Diagnosis not present

## 2017-06-05 DIAGNOSIS — M810 Age-related osteoporosis without current pathological fracture: Secondary | ICD-10-CM | POA: Diagnosis not present

## 2017-06-05 DIAGNOSIS — M818 Other osteoporosis without current pathological fracture: Secondary | ICD-10-CM

## 2017-06-05 DIAGNOSIS — M25552 Pain in left hip: Secondary | ICD-10-CM | POA: Diagnosis not present

## 2017-06-05 DIAGNOSIS — G8929 Other chronic pain: Secondary | ICD-10-CM | POA: Diagnosis not present

## 2017-06-05 DIAGNOSIS — M8588 Other specified disorders of bone density and structure, other site: Secondary | ICD-10-CM

## 2017-06-05 DIAGNOSIS — M8580 Other specified disorders of bone density and structure, unspecified site: Secondary | ICD-10-CM | POA: Diagnosis not present

## 2017-06-05 DIAGNOSIS — F329 Major depressive disorder, single episode, unspecified: Secondary | ICD-10-CM | POA: Diagnosis not present

## 2017-06-05 LAB — CBC WITH DIFFERENTIAL/PLATELET
Basophils Absolute: 0 10*3/uL (ref 0.0–0.1)
Basophils Relative: 0 %
Eosinophils Absolute: 0.1 10*3/uL (ref 0.0–0.7)
Eosinophils Relative: 1 %
HCT: 40.7 % (ref 36.0–46.0)
Hemoglobin: 13.6 g/dL (ref 12.0–15.0)
Lymphocytes Relative: 22 %
Lymphs Abs: 1.7 10*3/uL (ref 0.7–4.0)
MCH: 30.4 pg (ref 26.0–34.0)
MCHC: 33.4 g/dL (ref 30.0–36.0)
MCV: 91.1 fL (ref 78.0–100.0)
Monocytes Absolute: 0.5 10*3/uL (ref 0.1–1.0)
Monocytes Relative: 6 %
Neutro Abs: 5.3 10*3/uL (ref 1.7–7.7)
Neutrophils Relative %: 71 %
Platelets: 279 10*3/uL (ref 150–400)
RBC: 4.47 MIL/uL (ref 3.87–5.11)
RDW: 12.9 % (ref 11.5–15.5)
WBC: 7.5 10*3/uL (ref 4.0–10.5)

## 2017-06-05 LAB — COMPREHENSIVE METABOLIC PANEL
ALT: 13 U/L — ABNORMAL LOW (ref 14–54)
AST: 20 U/L (ref 15–41)
Albumin: 3.9 g/dL (ref 3.5–5.0)
Alkaline Phosphatase: 59 U/L (ref 38–126)
Anion gap: 8 (ref 5–15)
BUN: 11 mg/dL (ref 6–20)
CO2: 27 mmol/L (ref 22–32)
Calcium: 9.3 mg/dL (ref 8.9–10.3)
Chloride: 101 mmol/L (ref 101–111)
Creatinine, Ser: 0.77 mg/dL (ref 0.44–1.00)
GFR calc Af Amer: >60 mL/min (ref >60)
GFR calc non Af Amer: >60 mL/min (ref >60)
Glucose, Bld: 131 mg/dL — ABNORMAL HIGH (ref 65–99)
Potassium: 4.3 mmol/L (ref 3.5–5.1)
Sodium: 136 mmol/L (ref 135–145)
Total Bilirubin: 0.5 mg/dL (ref 0.3–1.2)
Total Protein: 6.9 g/dL (ref 6.5–8.1)

## 2017-06-05 MED ORDER — DENOSUMAB 60 MG/ML ~~LOC~~ SOLN
60.0000 mg | Freq: Once | SUBCUTANEOUS | Status: AC
  Administered 2017-06-05: 60 mg via SUBCUTANEOUS
  Filled 2017-06-05: qty 1

## 2017-06-05 MED ORDER — LETROZOLE 2.5 MG PO TABS: 2.5000 mg | ORAL_TABLET | Freq: Every day | ORAL | 3 refills | Status: DC

## 2017-06-05 NOTE — Patient Instructions (Addendum)
[place] at [facility] Discharge Instructions  RECOMMENDATIONS MADE BY THE CONSULTANT AND ANY TEST RESULTS WILL BE SENT TO YOUR REFERRING PHYSICIAN.  You saw Mike Craze, NP, today Stop Anastrozole Start Letrozole as soon as you can- prescription has been sent to your pharmacy. Follow up in 4 months with labs. See Amy at checkout for appointments.   Thank you for choosing [facility] to provide your oncology and hematology care.  To afford each patient quality time with our provider, please arrive at least 15 minutes before your scheduled appointment time.    If you have a lab appointment with the [facility] please come in thru the  Main Entrance and check in at the main information desk  You need to re-schedule your appointment should you arrive 10 or more minutes late.  We strive to give you quality time with our providers, and arriving late affects you and other patients whose appointments are after yours.  Also, if you no show three or more times for appointments you may be dismissed from the clinic at the providers discretion.     Again, thank you for choosing [facility].  Our hope is that these requests will decrease the amount of time that you wait before being seen by our physicians.       _____________________________________________________________  Should you have questions after your visit to [facility], please contact our office at [phone] between the hours of 8:30 a.m. and 4:30 p.m.  Voicemails left after 4:30 p.m. will not be returned until the following business day.  For prescription refill requests, have your pharmacy contact our office.       Resources For Cancer Patients and their Caregivers ? American Cancer Society: Can assist with transportation, wigs, general needs, runs Look Good Feel Better.        248 578 8595 ? Cancer Care: Provides financial  assistance, online support groups, medication/co-pay assistance.  1-800-813-HOPE 805-190-1077) ? [facility] Assists [place] cancer patients and their families through emotional , educational and financial support.  (708) 717-8632 ? [place] DSS Where to apply for food stamps, Medicaid and utility assistance. 775-029-4781 ? [employer]: Transportation to medical appointments. 930 820 0786 ? Social Security Administration: May apply for disability if have a Stage IV cancer. (859)357-5389 772-075-2660 ? [employer], Disability and Transit Services: Assists with nutrition, care and transit needs. [place] Support Programs: @10RELATIVEDAYS @ > Cancer Support Group  2nd Tuesday of the month 1pm-2pm, Journey Room  > Creative Journey  3rd Tuesday of the month 1130am-1pm, Journey Room  > Look Good Feel Better  1st Wednesday of the month 10am-12 noon, Journey Room (Call [place] to register [phone])

## 2017-06-05 NOTE — Progress Notes (Signed)
Amy [name] presents today for injection per the provider's orders.  Prolia administration without incident; see MAR for injection details.  Patient tolerated procedure well and without incident.  No questions or complaints noted at this time.  Discharged ambulatory.

## 2017-06-16 ENCOUNTER — Telehealth (HOSPITAL_COMMUNITY): Payer: Self-pay

## 2017-06-16 NOTE — Telephone Encounter (Signed)
Patient called today concerned about her aches and joint pain. Stated she saw the NP here last week and changed her Aromatase inhibitor to Lotrozole to see if this would help her any better. Patient is concerned her Primary MD is treating her for arthritis and maybe does not need to be , wanted a nurse to call Dr. Luan Pulling and let him know what medication she was changed too and make sure he knew. She is concerned about all her pain and that she is having to take a lot of extra pain medication.  I contacted a nurse at Dr. Luan Pulling office, relayed all the information and she will let him know. I called patient back to let her know I contacted Dr. Kathaleen Grinder Nurse. She was very appreciative of this.

## 2017-07-01 ENCOUNTER — Telehealth (HOSPITAL_COMMUNITY): Payer: Self-pay

## 2017-07-01 NOTE — Telephone Encounter (Signed)
Patient called and said the letrozole she started in place of the Arimidex is causing her more pain and difficulty than the Arimidex. She is getting to the point where she cannot take care of herself. Her PCP, Dr. Luan Pulling, is treating her for arthritis with pain medication with tylenol in between doses with little help. She needs to know what to do. Is there another medication she can try besides letrozole? She wants to know what she will do if she cannot take any anti-estrogen therapy. She states she has reached her wits end and doesn't know what to do or where to go to get some assistance. Explained to patient I would review with Elzie Rings and call her back. She verbalized understanding.

## 2017-07-01 NOTE — Telephone Encounter (Signed)
We could try Aromasin; this would likely be the last best option in terms of anti-estrogen therapy.  If she chooses to stop anti-estrogen therapy, she may have a slightly higher risk of her breast cancer coming back. However, there are some people who cannot tolerate anti-estrogen therapy and we continue to monitor them with breast exams and mammograms.  If she would like to try Aromasin, we can.  Just let me know what she decides to do and/or we can bring her back in to talk about her options more in-person if she prefers.   Mike Craze, NP [place] [phone]

## 2017-07-01 NOTE — Telephone Encounter (Signed)
Noted; this is not unreasonable and I understand. She has had a hard time with her pain for quite some time. As I've talked with her in-person before, it may be difficult to tell if the anti-estrogen therapy is truly making her pain worse, or if her pre-existing pain is so severe causing the perception that the AI is making the pain worse. Either way, we respect her decision.   Please ensure she maintains her follow-up visits here and her mammograms as scheduled; they will be continue to be very important in her breast cancer surveillance.   gwd

## 2017-07-01 NOTE — Telephone Encounter (Signed)
Patient has decided to stop all anti estrogen therapy. She states she has to stop because she cannot "live like this". She is going to call in 1-2 weeks and give an update on how she is feeling.

## 2017-07-03 ENCOUNTER — Ambulatory Visit (HOSPITAL_COMMUNITY): Payer: Medicare HMO | Admitting: Psychiatry

## 2017-07-03 ENCOUNTER — Encounter (HOSPITAL_COMMUNITY): Payer: Self-pay | Admitting: Psychiatry

## 2017-07-03 VITALS — BP 144/78 | HR 104 | Ht 64.0 in | Wt 163.0 lb

## 2017-07-03 DIAGNOSIS — R69 Illness, unspecified: Secondary | ICD-10-CM | POA: Diagnosis not present

## 2017-07-03 DIAGNOSIS — Z87891 Personal history of nicotine dependence: Secondary | ICD-10-CM | POA: Diagnosis not present

## 2017-07-03 DIAGNOSIS — R413 Other amnesia: Secondary | ICD-10-CM | POA: Diagnosis not present

## 2017-07-03 DIAGNOSIS — F418 Other specified anxiety disorders: Secondary | ICD-10-CM | POA: Diagnosis not present

## 2017-07-03 DIAGNOSIS — Z811 Family history of alcohol abuse and dependence: Secondary | ICD-10-CM | POA: Diagnosis not present

## 2017-07-03 DIAGNOSIS — M2550 Pain in unspecified joint: Secondary | ICD-10-CM | POA: Diagnosis not present

## 2017-07-03 DIAGNOSIS — Z818 Family history of other mental and behavioral disorders: Secondary | ICD-10-CM | POA: Diagnosis not present

## 2017-07-03 DIAGNOSIS — M549 Dorsalgia, unspecified: Secondary | ICD-10-CM | POA: Diagnosis not present

## 2017-07-03 DIAGNOSIS — R450 Nervousness: Secondary | ICD-10-CM

## 2017-07-03 MED ORDER — RISPERIDONE 1 MG PO TABS: 1.0000 mg | ORAL_TABLET | Freq: Every day | ORAL | 2 refills | Status: DC

## 2017-07-03 MED ORDER — ALPRAZOLAM 0.5 MG PO TABS: 0.5000 mg | ORAL_TABLET | Freq: Three times a day (TID) | ORAL | 2 refills | Status: DC | PRN

## 2017-07-03 MED ORDER — VENLAFAXINE HCL ER 150 MG PO CP24: 150.0000 mg | ORAL_CAPSULE | Freq: Two times a day (BID) | ORAL | 2 refills | Status: DC

## 2017-07-03 MED ORDER — ESCITALOPRAM OXALATE 20 MG PO TABS: 20.0000 mg | ORAL_TABLET | Freq: Every day | ORAL | 2 refills | Status: DC
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
## 2017-07-10 ENCOUNTER — Ambulatory Visit (HOSPITAL_COMMUNITY): Payer: Medicare HMO | Admitting: Licensed Clinical Social Worker

## 2017-07-24 DIAGNOSIS — E785 Hyperlipidemia, unspecified: Secondary | ICD-10-CM | POA: Diagnosis not present

## 2017-07-24 DIAGNOSIS — M545 Low back pain: Secondary | ICD-10-CM | POA: Diagnosis not present

## 2017-07-24 DIAGNOSIS — E119 Type 2 diabetes mellitus without complications: Secondary | ICD-10-CM | POA: Diagnosis not present

## 2017-07-24 DIAGNOSIS — I10 Essential (primary) hypertension: Secondary | ICD-10-CM | POA: Diagnosis not present

## 2017-08-07 ENCOUNTER — Ambulatory Visit (HOSPITAL_COMMUNITY): Payer: Medicare HMO | Admitting: Licensed Clinical Social Worker

## 2017-08-07 ENCOUNTER — Encounter (HOSPITAL_COMMUNITY): Payer: Self-pay | Admitting: Licensed Clinical Social Worker

## 2017-08-07 DIAGNOSIS — F330 Major depressive disorder, recurrent, mild: Secondary | ICD-10-CM

## 2017-08-07 DIAGNOSIS — R69 Illness, unspecified: Secondary | ICD-10-CM | POA: Diagnosis not present

## 2017-08-07 NOTE — Progress Notes (Signed)
   THERAPIST PROGRESS NOTE  Session Time: 1:00 pm-1:55 pm  Participation Level: Active  Behavioral Response: CasualAlertDepressed  Type of Therapy: Individual Therapy  Treatment Goals addressed: Coping  Interventions: CBT and Solution Focused  Summary: Amy [name] is a 82 y.o. female who presents oriented x5 (person, place, situation, time and object), alert but distracted, appropriate grooming, appropriate dress, and cooperative to address symptoms of depression. Patient has a history of medical treatment for chronic pain including back, and breast cancer. Patient has a history of mental health treatment including outpatient therapy, medication management and was hospitalized over 20 years ago for mental health concerns. Patient denies symptoms of mania. She denies suicidal and homicidal ideations. Patient denies psychosis including auditory and visual hallucinations. Patient denies substance use. She is at low risk for lethality at this time. Patient's mood is impacted by her health issues.  Patient reported that she has some increased physical pain in her back. She was told by her doctor that she has no other options but surgery. Patient noted that even though her pain has increased she has adjusted and adapted to be able to achieve daily activities. She has accepted where she is at in life and she is going to continue to do what she can and get help to accomplish the things she has difficulty achieving.   Patient engaged in session. She responded well to interventions. Patient continues to meet criteria for Major depressive disorder, recurrent episode, mild with anxious distress. Patient will continue in outpatient therapy due to being the least restrictive service to meet her needs at this time. Patient made moderate progress on her goals at this time.  Suicidal/Homicidal: Negativewithout intent/plan  Therapist Response: Therapist reviewed patient's recent thoughts and behaviors.  Therapist utilized CBT to address mood. Therapist processed patient's mood to identify triggers. Therapist discussed how pain can impact mood. Therapist had patient identify how she has been able to adapt to manage her physical pain.   Plan: Return again in 2-3 weeks.   Diagnosis: Axis I: Major depressive disorder, recurrent episode, mild with anxious distress    Axis II: No diagnosis    Glori Bickers, LCSW 08/07/2017

## 2017-08-08 DIAGNOSIS — B351 Tinea unguium: Secondary | ICD-10-CM | POA: Diagnosis not present

## 2017-08-08 DIAGNOSIS — E1142 Type 2 diabetes mellitus with diabetic polyneuropathy: Secondary | ICD-10-CM | POA: Diagnosis not present

## 2017-08-08 DIAGNOSIS — L851 Acquired keratosis [keratoderma] palmaris et plantaris: Secondary | ICD-10-CM | POA: Diagnosis not present

## 2017-08-08 DIAGNOSIS — Z79891 Long term (current) use of opiate analgesic: Secondary | ICD-10-CM | POA: Diagnosis not present

## 2017-08-14 DIAGNOSIS — M4156 Other secondary scoliosis, lumbar region: Secondary | ICD-10-CM | POA: Diagnosis not present

## 2017-08-14 DIAGNOSIS — M4310 Spondylolisthesis, site unspecified: Secondary | ICD-10-CM | POA: Diagnosis not present

## 2017-08-14 DIAGNOSIS — M419 Scoliosis, unspecified: Secondary | ICD-10-CM | POA: Insufficient documentation

## 2017-08-18 DIAGNOSIS — R69 Illness, unspecified: Secondary | ICD-10-CM | POA: Diagnosis not present

## 2017-08-19 DIAGNOSIS — R69 Illness, unspecified: Secondary | ICD-10-CM | POA: Diagnosis not present

## 2017-08-21 ENCOUNTER — Ambulatory Visit (HOSPITAL_COMMUNITY): Payer: Self-pay | Admitting: Licensed Clinical Social Worker

## 2017-08-26 ENCOUNTER — Ambulatory Visit (HOSPITAL_COMMUNITY): Payer: Self-pay | Admitting: Licensed Clinical Social Worker

## 2017-09-01 ENCOUNTER — Encounter: Payer: Self-pay | Admitting: Orthopedic Surgery

## 2017-09-01 ENCOUNTER — Ambulatory Visit: Payer: Medicare HMO | Admitting: Orthopedic Surgery

## 2017-09-01 ENCOUNTER — Ambulatory Visit (INDEPENDENT_AMBULATORY_CARE_PROVIDER_SITE_OTHER): Payer: Medicare HMO

## 2017-09-01 VITALS — BP 99/65 | HR 90 | Ht 64.0 in | Wt 162.0 lb

## 2017-09-01 DIAGNOSIS — M25561 Pain in right knee: Secondary | ICD-10-CM

## 2017-09-01 DIAGNOSIS — M1711 Unilateral primary osteoarthritis, right knee: Secondary | ICD-10-CM | POA: Diagnosis not present

## 2017-09-01 DIAGNOSIS — G8929 Other chronic pain: Secondary | ICD-10-CM

## 2017-09-01 DIAGNOSIS — M25461 Effusion, right knee: Secondary | ICD-10-CM | POA: Diagnosis not present

## 2017-09-01 NOTE — Patient Instructions (Signed)
Today's visit included evaluation of the right knee  We found that the patient has severe arthritis of the lateral compartment of the right knee with effusion we aspirated 25 cc of clear fluid and placed her in a brace

## 2017-09-01 NOTE — Progress Notes (Signed)
Progress Note   Patient ID: Amy [name], female   DOB: 1935-09-15, 82 y.o.   MRN: [id number]  Chief Complaint  Patient presents with  . Follow-up    Recheck on right knee.    82 year old female evaluated ongoing for spinal stenosis with Dr. Trenton Gammon presents for reevaluation of her right knee  She had a knee arthroscopy for torn medial and lateral meniscus developed postoperative arthritis became unhappy with her care and sought second opinion here.  At that time we thought she had arthrosis and would need a knee replacement  She comes in with recent onset (last 3 weeks) of increasing pain in the lateral compartment of the right knee with effusion limp and decreased ability to perform her activities of daily living.  She denies any history of a fever related to her knee joint.     Review of Systems  Constitutional: Negative for chills, fever and weight loss.  Genitourinary: Positive for frequency.  Neurological: Negative for tingling.   No outpatient medications have been marked as taking for the 09/01/17 encounter (Office Visit) with [name], MD.    Past Medical History:  Diagnosis Date  . Anxiety   . Arthritis   . Back pain   . Colon polyps   . Depression   . Diabetes mellitus, type II ([place])    diet controlled  . Hypercholesterolemia   . Invasive ductal carcinoma of breast, female, right ([place])   . Osteopenia 11/26/2016  . Skin-picking disorder    has 3 open wounds on right wrist that are being treated. About a quarter in size.     Allergies  Allergen Reactions  . Cymbalta [Duloxetine Hcl] Other (See Comments)    Hallucinated and couldn't think; got worse and worse the longer she took this.    BP 99/65   Pulse 90   Ht 5\' 4"  (1.626 m)   Wt 162 lb (73.5 kg)   BMI 27.81 kg/m    Physical Exam  Constitutional: She is oriented to person, place, and time. She appears well-developed and well-nourished.  Neurological: She is alert and oriented to person,  place, and time. Gait abnormal.  Psychiatric: She has a normal mood and affect.  Vitals reviewed.   Ortho Exam Right knee effusion large tenderness lateral compartment knee flexion 115 degrees no extension deficit instability normal strength intact muscle tone good skin normal pulses good sensation normal  Left knee range of motion stability and strength were normal   see dictated report for severe arthritis lateral compartment right knee  Encounter Diagnoses  Name Primary?  . Chronic pain of right knee Yes  . Arthritis of right knee   . Effusion, right knee    Procedure note injection and aspiration right knee joint  Verbal consent was obtained to aspirate and inject the right knee joint   Timeout was completed to confirm the site of aspiration and injection  An 18-gauge needle was used to aspirate the knee joint from a suprapatellar lateral approach.  The medications used were 40 mg of Depo-Medrol and 1% lidocaine 3 cc  Anesthesia was provided by ethyl chloride and the skin was prepped with alcohol.  After cleaning the skin with alcohol an 18-gauge needle was used to aspirate the right knee joint.  We obtained 25  cc of fluid  We follow this by injection of 40 mg of Depo-Medrol and 3 cc 1% lidocaine.  There were no complications. A sterile bandage was applied.  Brace right knee  Follow-up in a month  I feel that the patient will not do well with knee surgery with ongoing lumbar disc problems and would not recommend surgery until that is quiesced and treated Arther Abbott, MD 09/01/2017 3:29 PM

## 2017-09-12 ENCOUNTER — Other Ambulatory Visit (HOSPITAL_COMMUNITY)
Admission: RE | Admit: 2017-09-12 | Discharge: 2017-09-12 | Disposition: A | Discharge status: Home / Self Care | Payer: Medicare HMO | Source: Ambulatory Visit | Attending: Pulmonary Disease | Admitting: Pulmonary Disease

## 2017-09-12 DIAGNOSIS — Z79890 Hormone replacement therapy (postmenopausal): Secondary | ICD-10-CM | POA: Insufficient documentation

## 2017-09-12 DIAGNOSIS — Z79891 Long term (current) use of opiate analgesic: Secondary | ICD-10-CM | POA: Diagnosis not present

## 2017-09-24 DIAGNOSIS — I10 Essential (primary) hypertension: Secondary | ICD-10-CM | POA: Diagnosis not present

## 2017-09-24 DIAGNOSIS — E119 Type 2 diabetes mellitus without complications: Secondary | ICD-10-CM | POA: Diagnosis not present

## 2017-09-24 DIAGNOSIS — M25561 Pain in right knee: Secondary | ICD-10-CM | POA: Diagnosis not present

## 2017-09-24 DIAGNOSIS — M545 Low back pain: Secondary | ICD-10-CM | POA: Diagnosis not present

## 2017-09-25 ENCOUNTER — Other Ambulatory Visit (HOSPITAL_COMMUNITY): Payer: Self-pay | Admitting: Neurosurgery

## 2017-09-25 DIAGNOSIS — M818 Other osteoporosis without current pathological fracture: Secondary | ICD-10-CM | POA: Insufficient documentation

## 2017-09-25 DIAGNOSIS — M4310 Spondylolisthesis, site unspecified: Secondary | ICD-10-CM

## 2017-09-25 DIAGNOSIS — Z780 Asymptomatic menopausal state: Secondary | ICD-10-CM | POA: Insufficient documentation

## 2017-09-25 DIAGNOSIS — M810 Age-related osteoporosis without current pathological fracture: Secondary | ICD-10-CM | POA: Insufficient documentation

## 2017-09-29 DIAGNOSIS — M545 Low back pain: Secondary | ICD-10-CM | POA: Diagnosis not present

## 2017-09-29 DIAGNOSIS — M25561 Pain in right knee: Secondary | ICD-10-CM | POA: Diagnosis not present

## 2017-09-29 DIAGNOSIS — I10 Essential (primary) hypertension: Secondary | ICD-10-CM | POA: Diagnosis not present

## 2017-09-29 DIAGNOSIS — E119 Type 2 diabetes mellitus without complications: Secondary | ICD-10-CM | POA: Diagnosis not present

## 2017-10-01 ENCOUNTER — Ambulatory Visit (HOSPITAL_COMMUNITY): Payer: Medicare HMO | Admitting: Psychiatry

## 2017-10-01 ENCOUNTER — Encounter (HOSPITAL_COMMUNITY): Payer: Self-pay | Admitting: Psychiatry

## 2017-10-01 ENCOUNTER — Encounter: Payer: Self-pay | Admitting: Orthopedic Surgery

## 2017-10-01 ENCOUNTER — Ambulatory Visit: Payer: Medicare HMO | Admitting: Orthopedic Surgery

## 2017-10-01 VITALS — BP 121/82 | HR 66 | Ht 64.0 in | Wt 169.0 lb

## 2017-10-01 VITALS — BP 148/75 | HR 72 | Ht 64.0 in | Wt 169.0 lb

## 2017-10-01 DIAGNOSIS — M7631 Iliotibial band syndrome, right leg: Secondary | ICD-10-CM

## 2017-10-01 DIAGNOSIS — Z818 Family history of other mental and behavioral disorders: Secondary | ICD-10-CM | POA: Diagnosis not present

## 2017-10-01 DIAGNOSIS — Z9889 Other specified postprocedural states: Secondary | ICD-10-CM | POA: Diagnosis not present

## 2017-10-01 DIAGNOSIS — F330 Major depressive disorder, recurrent, mild: Secondary | ICD-10-CM | POA: Diagnosis not present

## 2017-10-01 DIAGNOSIS — M2550 Pain in unspecified joint: Secondary | ICD-10-CM

## 2017-10-01 DIAGNOSIS — R69 Illness, unspecified: Secondary | ICD-10-CM | POA: Diagnosis not present

## 2017-10-01 DIAGNOSIS — Z87891 Personal history of nicotine dependence: Secondary | ICD-10-CM

## 2017-10-01 DIAGNOSIS — M1711 Unilateral primary osteoarthritis, right knee: Secondary | ICD-10-CM

## 2017-10-01 DIAGNOSIS — G4700 Insomnia, unspecified: Secondary | ICD-10-CM

## 2017-10-01 DIAGNOSIS — M791 Myalgia, unspecified site: Secondary | ICD-10-CM

## 2017-10-01 DIAGNOSIS — M549 Dorsalgia, unspecified: Secondary | ICD-10-CM | POA: Diagnosis not present

## 2017-10-01 DIAGNOSIS — Z811 Family history of alcohol abuse and dependence: Secondary | ICD-10-CM

## 2017-10-01 MED ORDER — RISPERIDONE 1 MG PO TABS: 1.0000 mg | ORAL_TABLET | Freq: Every day | ORAL | 2 refills | Status: DC

## 2017-10-01 MED ORDER — VENLAFAXINE HCL ER 150 MG PO CP24: 150.0000 mg | ORAL_CAPSULE | Freq: Two times a day (BID) | ORAL | 2 refills | Status: DC

## 2017-10-01 MED ORDER — TRAZODONE HCL 50 MG PO TABS: 50.0000 mg | ORAL_TABLET | Freq: Every day | ORAL | 2 refills | Status: DC

## 2017-10-01 MED ORDER — ESCITALOPRAM OXALATE 20 MG PO TABS: 20.0000 mg | ORAL_TABLET | Freq: Every day | ORAL | 2 refills | Status: DC

## 2017-10-01 MED ORDER — ALPRAZOLAM 0.5 MG PO TABS: 0.5000 mg | ORAL_TABLET | Freq: Three times a day (TID) | ORAL | 2 refills | Status: DC | PRN

## 2017-10-01 NOTE — Progress Notes (Signed)
Progress Note   Patient ID: Amy [name], female   DOB: March 01, 1936, 82 y.o.   MRN: [id number]  Chief Complaint  Patient presents with  . Knee Pain    right knee    C/O PAIN LATERAL KNEE AT ITB X 2 WEEKS      Review of Systems  Genitourinary: Positive for frequency.  Musculoskeletal: Positive for back pain and joint pain.   Current Meds  Medication Sig  . ALPRAZolam (XANAX) 0.5 MG tablet Take 1 tablet (0.5 mg total) by mouth 3 (three) times daily as needed for anxiety.  . Calcium Carb-Cholecalciferol (OYSTER CALCIUM/D3) 500-200 MG-UNIT TABS Take 2 tablets by mouth daily.  . cholecalciferol (VITAMIN D) 1000 units tablet Take 1 tablet (1,000 Units total) by mouth daily. (Patient taking differently: Take 2,000 Units by mouth daily. )  . docusate sodium (COLACE) 100 MG capsule Take 100 mg by mouth as needed for mild constipation.  Marland Kitchen escitalopram (LEXAPRO) 20 MG tablet Take 1 tablet (20 mg total) by mouth daily.  Marland Kitchen letrozole (FEMARA) 2.5 MG tablet Take 1 tablet (2.5 mg total) daily by mouth.  . metFORMIN (GLUCOPHAGE) 500 MG tablet Take 500 mg by mouth 2 (two) times daily with a meal.   . oxyCODONE-acetaminophen (PERCOCET/ROXICET) 5-325 MG tablet Take 1 tablet by mouth every 6 (six) hours as needed for moderate pain or severe pain.  Marland Kitchen risperiDONE (RISPERDAL) 1 MG tablet Take 1 tablet (1 mg total) by mouth at bedtime.  . traZODone (DESYREL) 50 MG tablet Take 1 tablet (50 mg total) by mouth at bedtime.  . valACYclovir (VALTREX) 500 MG tablet Take 1,000 mg by mouth See admin instructions. During outbreak - pt takes 2 tabs for 3 days   . venlafaxine XR (EFFEXOR XR) 150 MG 24 hr capsule Take 1 capsule (150 mg total) by mouth 2 (two) times daily.    Allergies  Allergen Reactions  . Cymbalta [Duloxetine Hcl] Other (See Comments)    Hallucinated and couldn't think; got worse and worse the longer she took this.     BP 121/82   Pulse 66   Ht 5\' 4"  (1.626 m)   Wt 169 lb (76.7 kg)   BMI  29.01 kg/m   Physical Exam  Constitutional: She is oriented to person, place, and time. She appears well-developed and well-nourished.  Musculoskeletal:       Right knee: She exhibits decreased range of motion and abnormal alignment. Tenderness found.       Legs: Neurological: She is alert and oriented to person, place, and time.  Psychiatric: She has a normal mood and affect. Judgment normal.  Vitals reviewed.    Medical decision-making Encounter Diagnoses  Name Primary?  . Primary osteoarthritis of right knee Yes  . S/P right knee arthroscopy   . Iliotibial band syndrome of right side    TIME OUT AND VERBAL CONSENT OBTAINED A steroid injection was performed at RIGHT ITB using 1% plain Lidocaine and 40 mg of DEPOMEDROL . This was well tolerated.  No orders of the defined types were placed in this encounter.  FU 3 MONTHS   Arther Abbott, MD 10/01/2017 4:46 PM
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
## 2017-10-06 ENCOUNTER — Other Ambulatory Visit: Payer: Self-pay | Admitting: Adult Health

## 2017-10-06 DIAGNOSIS — Z853 Personal history of malignant neoplasm of breast: Secondary | ICD-10-CM

## 2017-10-09 ENCOUNTER — Ambulatory Visit (HOSPITAL_COMMUNITY): Payer: Medicare HMO | Admitting: Licensed Clinical Social Worker

## 2017-10-09 ENCOUNTER — Encounter (HOSPITAL_COMMUNITY): Payer: Self-pay | Admitting: Licensed Clinical Social Worker

## 2017-10-09 DIAGNOSIS — R69 Illness, unspecified: Secondary | ICD-10-CM | POA: Diagnosis not present

## 2017-10-09 DIAGNOSIS — F330 Major depressive disorder, recurrent, mild: Secondary | ICD-10-CM | POA: Diagnosis not present

## 2017-10-09 NOTE — Progress Notes (Signed)
   THERAPIST PROGRESS NOTE  Session Time: 1:00 pm-1:55 pm  Participation Level: Active  Behavioral Response: CasualAlertDepressed  Type of Therapy: Individual Therapy  Treatment Goals addressed: Coping  Interventions: CBT and Solution Focused  Summary: Amy [name] is a 82 y.o. female who presents oriented x5 (person, place, situation, time and object), alert but distracted, appropriate grooming, appropriate dress, and cooperative to address symptoms of depression. Patient has a history of medical treatment for chronic pain including back, and breast cancer. Patient has a history of mental health treatment including outpatient therapy, medication management and was hospitalized over 20 years ago for mental health concerns. Patient denies symptoms of mania. She denies suicidal and homicidal ideations. Patient denies psychosis including auditory and visual hallucinations. Patient denies substance use. She is at low risk for lethality at this time. Patient's mood is impacted by her health issues.  Physically: Patient has several health issues including multiple back diagnosis. She was having difficulty with bladder control and was voiding in her clothing and bedding. She noted this has improved but she continues to have back pain. Patient is unable to cook for herself or do many daily activities due to her health.  Spiritually/Values: No identified.  Relationships: Patient's relationship with her son is going better. She had no problem with him but more so his wife getting upset that she would call on him so much. Patient has backed off some but still calls him if she has a major need.  Emotional/Mental/Behavior: Patient reports feelings of depression due to the physical pain that she is in. She feels like she is getting to the point where she doesn't care anymore due to how her health condition has not improved. She denies suicidal thoughts. She is just not getting as upset over small things  she can't control.   Patient engaged in session. She responded well to interventions. Patient continues to meet criteria for Major depressive disorder, recurrent episode, mild with anxious distress. Patient will continue in outpatient therapy due to being the least restrictive service to meet her needs at this time. Patient made moderate progress on her goals at this time.  Suicidal/Homicidal: Negativewithout intent/plan  Therapist Response: Therapist reviewed patient's recent thoughts and behaviors. Therapist utilized CBT to address mood. Therapist processed patient's mood to identify triggers. Therapist discussed how pain can impact mood. Therapist discussed with patient continuing to manage her health to manage her mood.   Plan: Return again in 2-3 weeks.   Diagnosis: Axis I: Major depressive disorder, recurrent episode, mild with anxious distress    Axis II: No diagnosis    Glori Bickers, LCSW 10/09/2017

## 2017-10-13 ENCOUNTER — Other Ambulatory Visit (HOSPITAL_COMMUNITY): Payer: Self-pay | Admitting: Neurosurgery

## 2017-10-13 DIAGNOSIS — M810 Age-related osteoporosis without current pathological fracture: Secondary | ICD-10-CM

## 2017-10-14 ENCOUNTER — Ambulatory Visit (HOSPITAL_COMMUNITY)
Admission: RE | Admit: 2017-10-14 | Discharge: 2017-10-14 | Disposition: A | Discharge status: Home / Self Care | Payer: Medicare HMO | Source: Ambulatory Visit | Attending: Adult Health | Admitting: Adult Health

## 2017-10-14 DIAGNOSIS — C50911 Malignant neoplasm of unspecified site of right female breast: Secondary | ICD-10-CM | POA: Diagnosis not present

## 2017-10-14 DIAGNOSIS — Z1231 Encounter for screening mammogram for malignant neoplasm of breast: Secondary | ICD-10-CM | POA: Diagnosis not present

## 2017-10-14 DIAGNOSIS — Z9889 Other specified postprocedural states: Secondary | ICD-10-CM | POA: Insufficient documentation

## 2017-10-14 DIAGNOSIS — R922 Inconclusive mammogram: Secondary | ICD-10-CM | POA: Diagnosis not present

## 2017-10-16 ENCOUNTER — Ambulatory Visit (HOSPITAL_COMMUNITY): Payer: Self-pay | Admitting: Internal Medicine

## 2017-10-17 DIAGNOSIS — L851 Acquired keratosis [keratoderma] palmaris et plantaris: Secondary | ICD-10-CM | POA: Diagnosis not present

## 2017-10-17 DIAGNOSIS — E1142 Type 2 diabetes mellitus with diabetic polyneuropathy: Secondary | ICD-10-CM | POA: Diagnosis not present

## 2017-10-17 DIAGNOSIS — B351 Tinea unguium: Secondary | ICD-10-CM | POA: Diagnosis not present

## 2017-10-18 DIAGNOSIS — R69 Illness, unspecified: Secondary | ICD-10-CM | POA: Diagnosis not present

## 2017-10-28 ENCOUNTER — Other Ambulatory Visit: Payer: Self-pay

## 2017-10-28 ENCOUNTER — Encounter (HOSPITAL_COMMUNITY): Payer: Self-pay | Admitting: Internal Medicine

## 2017-10-28 ENCOUNTER — Inpatient Hospital Stay (HOSPITAL_COMMUNITY): Payer: Medicare HMO | Attending: Internal Medicine | Admitting: Internal Medicine

## 2017-10-28 VITALS — BP 120/80 | HR 91 | Temp 98.1°F | Resp 16 | Ht 64.0 in | Wt 169.1 lb

## 2017-10-28 DIAGNOSIS — Z7984 Long term (current) use of oral hypoglycemic drugs: Secondary | ICD-10-CM | POA: Diagnosis not present

## 2017-10-28 DIAGNOSIS — Z87891 Personal history of nicotine dependence: Secondary | ICD-10-CM | POA: Diagnosis not present

## 2017-10-28 DIAGNOSIS — M8580 Other specified disorders of bone density and structure, unspecified site: Secondary | ICD-10-CM | POA: Diagnosis not present

## 2017-10-28 DIAGNOSIS — Z79811 Long term (current) use of aromatase inhibitors: Secondary | ICD-10-CM | POA: Insufficient documentation

## 2017-10-28 DIAGNOSIS — M129 Arthropathy, unspecified: Secondary | ICD-10-CM | POA: Insufficient documentation

## 2017-10-28 DIAGNOSIS — R69 Illness, unspecified: Secondary | ICD-10-CM | POA: Diagnosis not present

## 2017-10-28 DIAGNOSIS — F329 Major depressive disorder, single episode, unspecified: Secondary | ICD-10-CM | POA: Insufficient documentation

## 2017-10-28 DIAGNOSIS — M545 Low back pain: Secondary | ICD-10-CM | POA: Insufficient documentation

## 2017-10-28 DIAGNOSIS — C50911 Malignant neoplasm of unspecified site of right female breast: Secondary | ICD-10-CM | POA: Insufficient documentation

## 2017-10-28 DIAGNOSIS — Z170 Estrogen receptor positive status [ER+]: Secondary | ICD-10-CM | POA: Insufficient documentation

## 2017-10-28 DIAGNOSIS — I700 Atherosclerosis of aorta: Secondary | ICD-10-CM | POA: Insufficient documentation

## 2017-10-28 DIAGNOSIS — E119 Type 2 diabetes mellitus without complications: Secondary | ICD-10-CM | POA: Insufficient documentation

## 2017-10-28 DIAGNOSIS — Z79899 Other long term (current) drug therapy: Secondary | ICD-10-CM | POA: Diagnosis not present

## 2017-10-28 DIAGNOSIS — F418 Other specified anxiety disorders: Secondary | ICD-10-CM | POA: Diagnosis not present

## 2017-10-28 DIAGNOSIS — E7800 Pure hypercholesterolemia, unspecified: Secondary | ICD-10-CM | POA: Diagnosis not present

## 2017-10-28 DIAGNOSIS — Z86010 Personal history of colonic polyps: Secondary | ICD-10-CM | POA: Diagnosis not present

## 2017-10-28 NOTE — Progress Notes (Signed)
Diagnosis Invasive ductal carcinoma of breast, female, right ([place]) - Plan: CBC with Differential/Platelet, Comprehensive metabolic panel, Lactate dehydrogenase  Staging Cancer Staging Invasive ductal carcinoma of breast, female, right [facility]) Staging form: Breast, AJCC 8th Edition - Pathologic stage from 10/23/2016: Stage Unknown (pT1a, pNX, cM0, G1, ER: Positive, PR: Positive, HER2: Negative) - Signed by Baird Cancer, PA-C on 10/23/2016   DIAGNOSIS:  Invasive ductal carcinoma right breast, Stage IA (pT1aN0M0), ER/PR positive, HER-2 negative, S/P lumpectomy by Dr. Arnoldo Morale on 10/09/2016.  Assessment and Plan:  1.  Stage IA invasive ductal carcinoma of (R) breast; ER+/PR+/HER2-: Pt was diagnosed in 09/2016. Treated with (L) lumpectomy by Dr. Arnoldo Morale. No adjuvant radiation recommended. Started anti-estrogen therapy with Arimidex in late 09/2016.   She has been on Arimidex drug holiday from 04/29/17-today. She was recently seen by NP Renato Battles in 05/2017 and discussion was held regarding change of therapy to Femara 2.5 mg po daily.  Medication was reportedly e-scribed Letrozole to her pharmacy.   She is here to go over her diagnostic mammogram that was done on 10/14/2017 and was negative other than lumpectomy changes in the right breast.  She is recommended for bilateral diagnostic mammogram in March 2020.  She will return to clinic in November 2019 for follow-up as she is due for Prolia at that time.  She should continue Femara as directed and notify the office if she has any problems with the medication.   2.  Osteopenia. Most recent DEXA scan 11/25/16; osteopenia with T-score -1.6.  Next DEXA scan will be due in 10/2018.  She is currently on Prolia inj every 6 months. She reports she is due for Prolia in May 2019 and will return to clinic for her injection at that time. She should continue calcium/vitamin D.    3.  Bone pain.  She will continue to follow-up with PCP or Dr. Aline Brochure as directed.  4.   Depression: Follow-up with psychiatry as directed.  Interval History:  82 y.o. female with a medical history significant for depression with anxiety, insomnia secondary to depression, lumbago, diabetes, joint pain who was previously followed by Dr. Talbert Cage for Invasive ductal carcinoma right breast, Stage IA (pT1aN0M0), ER/PR positive, HER-2 negative.  Pt had been on Arimidex   Current Status: Patient is seen today for follow-up.  She is here today to go over her mammogram.  She reportedly has picked up Femara.  PATHOLOGY:    REASON FOR ADDENDUM, AMENDMENT OR CORRECTION: [id number]: AMENDMENT NOTE: Previously reported as: - LOW AND INTERMEDIATE GRADE DUCTAL CARCINOMA IN SITU WITH ASSOCIATED CALCIFICATIONS. - INTRADUCTAL PAPILLOMA FORMATION WITH ASSOCIATED LOW GRADE DUCTAL CARCINOMA IN SITU. - DUCTAL CARCINOMA IN SITU IS LESS THAN 0.1 CM FROM THE MARGIN IN MULTIPLE AREAS INCLUDING INTERMEDIATE DUCTAL CARCINOMA IN SITU ADJACENT TO CAUTERIZED EDGE. - SEE ONCOLOGY TEMPLATE. AMENDMENT REASON: On deeper levels cut for the quantitative estrogen receptor and progesterone receptor studies, an additional deeper level H&E slide was also cut and examined. On this additional H&E slide there is a small focus of invasive grade 1 ductal carcinoma measuring 0.18 cm in greatest dimension. The focus is away from the margins. The presence of tumor is confirmed with immunohistochemical stains. The diagnosis is updated to include this additional finding and the oncology template is replaced with an invasive carcinoma oncology template (the comment is also updated). The findings are called to Dr. [name] on 10/16/2016. ATTENTION: Revised diagnosis report. Previous sign out date: 10/24/2016 (initials: RAH / ecj date: 10/16/2016) ADDITIONAL INFORMATION:  FLUORESCENCE IN-SITU HYBRIDIZATION Results: HER2 - NEGATIVE RATIO OF HER2/CEP17 SIGNALS 1.38 AVERAGE HER2 COPY NUMBER PER CELL 2.35 Reference  Range: NEGATIVE HER2/CEP17 Ratio <2.0 and average HER2 copy number <4.0 EQUIVOCAL HER2/CEP17 Ratio <2.0 and average HER2 copy number >=4.0 and <6.0 POSITIVE HER2/CEP17 Ratio >=2.0 or <2.0 and average HER2 copy number >=6.0 Vicente Males MD 1 of 4 Amended copy Amended FINAL for Amy [name], Amy [name] ([id number]) ADDITIONAL INFORMATION:(continued) Industrial/product designer, Electronic Signature ( Signed 10/21/2016) PROGNOSTIC INDICATORS Results: IMMUNOHISTOCHEMICAL AND MORPHOMETRIC ANALYSIS PERFORMED MANUALLY Estrogen Receptor: 100%, POSITIVE, STRONG STAINING INTENSITY Progesterone Receptor: 90%, POSITIVE, STRONG STAINING INTENSITY Proliferation Marker Ki67: 1% REFERENCE RANGE ESTROGEN RECEPTOR NEGATIVE 0% POSITIVE =>1% REFERENCE RANGE PROGESTERONE RECEPTOR NEGATIVE 0% POSITIVE =>1% All controls stained appropriately Vicente Males MD Pathologist, Electronic Signature ( Signed 10/17/2016) FINAL DIAGNOSIS Diagnosis Breast, lumpectomy, right - INVASIVE GRADE 1 DUCTAL CARCINOMA, SPANNING 0.18 CM IN GREATEST DIMENSION. - LOW AND INTERMEDIATE GRADE DUCTAL CARCINOMA IN SITU WITH ASSOCIATED CALCIFICATIONS. - INTRADUCTAL PAPILLOMA FORMATION WITH ASSOCIATED LOW GRADE DUCTAL CARCINOMA IN SITU. - DUCTAL CARCINOMA IN SITU IS LESS THAN 0.1 CM FROM THE MARGIN IN MULTIPLE AREAS INCLUDING INTERMEDIATE DUCTAL CARCINOMA IN SITU ADJACENT TO CAUTERIZED EDGE. - SEE ONCOLOGY TEMPLATE. Microscopic Comment BREAST, INVASIVE TUMOR Procedure: Right breast lumpectomy. Laterality: Right. Tumor Size: 0.18 cm. Histologic Type: Invasive ductal carcinoma. Grade: 1. Tubular Differentiation: 1. Nuclear Pleomorphism: 1. Mitotic Count: 1. Ductal Carcinoma in Situ (DCIS): Yes, low and intermediate grade ductal carcinoma in situ with calcifications is present. Extent of Tumor: Invasive and in situ tumor is present in breast tissue. 2 of 4 Amended copy Amended FINAL for Amy [name], Amy [name] ([id number]) Microscopic  Comment(continued) Skin: Not received. Nipple: Not received. Skeletal muscle: Not received. Margins: Invasive carcinoma, distance from closest margin: Greater than 0.4 cm from the margin. DCIS, distance from closest margin: Ductal carcinoma in situ is less than 0.1 cm from the margin in multiple areas. Regional Lymph Nodes: Number of Lymph Nodes Examined: 0. Number of Sentinel Lymph Nodes Examined: 0. Lymph Nodes with Macrometastases: No lymph nodes examined. Lymph Nodes with Micrometastases: No lymph nodes examined. Lymph Nodes with Isolated Tumor Cells: No lymph nodes examined. Breast Prognostic Profile: Estrogen Receptor: Will be performed on current invasive tumor. Progesterone Receptor: Will be performed on current tumor. Her-2 FISH: Will be performed on current tumor. Ki-67: Will be performed on current tumor. Pathologic Stage Classification (pTNM, AJCC 8th Edition): Primary Tumor (pT): pT1a. Regional Lymph Nodes (pN): pNX. Comments: Immunohistochemical stains are performed for p63, calponin and smooth muscle myosin on deeper levels of block 1C. The stains confirm a small focus of invasive ductal carcinoma. Dr. Lyndon Code has seen the slides from block 1C, including deeper levels and immunohistochemical stained slides, with agreement that there is a 0.18 cm focus of invasive grade 1 ductal carcinoma present. Dr. Lanae Crumbly has seen the original slides of the case with agreement that the findings on the original slides represent low and intermediate grade ductal carcinoma in situ with associated calcifications; intraductal papilloma formation with associated low grade ductal carcinoma in situ; and that ductal carcinoma in situ is less than 0.1 cm from the margin in multiple areas (including intermediate grade ductal carcinoma in situ adjacent to the cauterized edge). (RH:ecj 10/16/2016) Willeen Niece MD Pathologist, Electronic Signature (Case signed 10/14/2016) Corrected Report  Signer Willeen Niece MD Pathologist, Electronic Signature (Case signed 10/16/2016)      Invasive ductal carcinoma of breast, female, right ([place])   10/09/2016 Procedure    Left breast lumpectomy by  Dr. Arnoldo Morale      10/16/2016 Pathology Results    Invasive, grade 1, ductal carcinoma, spanning 0.18 cmIn greatest dimension.  Low and intermediate grade DCIS with associated calcifications.  Intraductal papilloma formation with associated low-grade DCIS.  DCIS is less than 0.1 cm from the margin in multiple areas including intermediate ductal carcinoma in situ adjacent to cauterized edge.       10/16/2016 Pathology Results    ER +100%, PR +90%, HER-2 negative.      10/23/2016 Cancer Staging    Cancer Staging Invasive ductal carcinoma of breast, female, right [facility]) Staging form: Breast, AJCC 8th Edition - Pathologic stage from 10/23/2016: Stage Unknown (pT1a, pNX, cM0, G1, ER: Positive, PR: Positive, HER2: Negative) - Signed by Baird Cancer, PA-C on 10/23/2016       11/14/2016 Imaging    CT CAP-  1. No compelling findings of metastatic breast cancer. 2. Acute mild diverticulitis of the descending colon. 3. Further distally in the descending colon but there is a cluster of small lymph nodes adjacent to the colon. Although possibly related to the diverticulitis this is a bit distend and this type of clustered nodes is unusual in this location. Following resolution of the acute process, consider colonoscopy to exclude an underlying occult cancer. 4. 3.7 cm fatty mass along the lumen of the descending colon favors a lipoma or fatty polyp. 5. Aortic Atherosclerosis (ICD10-I70.0). 6. Minimal tree-in-bud reticulonodular opacities in the left lower lobe characteristic for atypical infectious bronchiolitis. 7. Left groin hernia containing adipose tissue, probably a direct inguinal hernia. 8. Exaggerated lumbar lordosis with grade 1 degenerative anterolisthesis at L4-5.      11/26/2016  Imaging    Bone density- BMD as determined from Femur Neck Left is 0.820 g/cm2 with a T-Score of -1.6.  This patient is considered OSTEOPENIC according to [facility]) criteria.        Problem List Patient Active Problem List   Diagnosis Date Noted  . Osteoporosis [M81.0] 12/13/2016  . Osteopenia [M85.80] 11/26/2016  . Invasive ductal carcinoma of breast, female, right ([place]) [C50.911]   . Lumbago [M54.5] 03/22/2014  . Stiffness of joint, not elsewhere classified, pelvic region and thigh [M25.659] 03/22/2014  . Muscle weakness (generalized) [M62.81] 03/22/2014  . Abnormality of gait [R26.9] 03/22/2014  . Depression with anxiety [F41.8] 09/03/2012  . Insomnia secondary to depression with anxiety [F51.05, F41.8] 09/03/2012  . Pain [R52] 09/03/2012    Past Medical History Past Medical History:  Diagnosis Date  . Anxiety   . Arthritis   . Back pain   . Colon polyps   . Depression   . Diabetes mellitus, type II ([place])    diet controlled  . Hypercholesterolemia   . Invasive ductal carcinoma of breast, female, right ([place])   . Osteopenia 11/26/2016  . Skin-picking disorder    has 3 open wounds on right wrist that are being treated. About a quarter in size.    Past Surgical History Past Surgical History:  Procedure Laterality Date  . APPENDECTOMY    . BREAST BIOPSY Right 10/09/2016   Procedure: BREAST BIOPSY WITH NEEDLE LOCALIZATION;  Surgeon: [name], MD;  Location: [facility];  Service: General;  Laterality: Right;  . BREAST SURGERY Right   . BUNIONECTOMY Bilateral   . FACIAL COSMETIC SURGERY     drooping eyelid and brow  . HERNIA REPAIR Right    Inguinal x2  . KNEE ARTHROSCOPY Right   . SHOULDER SURGERY Right  rotator cuff repair and removal of bone spur  . TEMPOROMANDIBULAR JOINT SURGERY Right     Family History Family History  Problem Relation Age of Onset  . Anxiety disorder Mother   . Depression Mother   . Depression Maternal  Grandmother   . Alcohol abuse Brother   . Cancer - Other Sister   . ADD / ADHD Neg Hx   . Bipolar disorder Neg Hx   . Dementia Neg Hx   . Drug abuse Neg Hx   . OCD Neg Hx   . Paranoid behavior Neg Hx   . Schizophrenia Neg Hx   . Seizures Neg Hx   . Sexual abuse Neg Hx   . Physical abuse Neg Hx      Social History  reports that she quit smoking about 22 years ago. Her smoking use included cigarettes. She has a 20.00 pack-year smoking history. She has never used smokeless tobacco. She reports that she does not drink alcohol or use drugs.  Medications  Current Outpatient Medications:  .  ALPRAZolam (XANAX) 0.5 MG tablet, Take 1 tablet (0.5 mg total) by mouth 3 (three) times daily as needed for anxiety., Disp: 90 tablet, Rfl: 2 .  Calcium Carb-Cholecalciferol (OYSTER CALCIUM/D3) 500-200 MG-UNIT TABS, Take 2 tablets by mouth daily., Disp: , Rfl:  .  cholecalciferol (VITAMIN D) 1000 units tablet, Take 1 tablet (1,000 Units total) by mouth daily. (Patient taking differently: Take 2,000 Units by mouth daily. ), Disp: 30 tablet, Rfl: 11 .  denosumab (PROLIA) 60 MG/ML SOLN injection, Inject 60 mg into the skin every 6 (six) months. Administer in upper arm, thigh, or abdomen, Disp: , Rfl:  .  docusate sodium (COLACE) 100 MG capsule, Take 100 mg by mouth as needed for mild constipation., Disp: , Rfl:  .  escitalopram (LEXAPRO) 20 MG tablet, Take 1 tablet (20 mg total) by mouth daily., Disp: 30 tablet, Rfl: 2 .  letrozole (FEMARA) 2.5 MG tablet, Take 1 tablet (2.5 mg total) daily by mouth., Disp: 30 tablet, Rfl: 3 .  metFORMIN (GLUCOPHAGE) 500 MG tablet, Take 500 mg by mouth 2 (two) times daily with a meal. , Disp: , Rfl:  .  oxyCODONE-acetaminophen (PERCOCET/ROXICET) 5-325 MG tablet, Take 1 tablet by mouth every 6 (six) hours as needed for moderate pain or severe pain., Disp: 30 tablet, Rfl: 0 .  risperiDONE (RISPERDAL) 1 MG tablet, Take 1 tablet (1 mg total) by mouth at bedtime., Disp: 30 tablet,  Rfl: 2 .  traZODone (DESYREL) 50 MG tablet, Take 1 tablet (50 mg total) by mouth at bedtime., Disp: 30 tablet, Rfl: 2 .  valACYclovir (VALTREX) 500 MG tablet, Take 1,000 mg by mouth See admin instructions. During outbreak - pt takes 2 tabs for 3 days , Disp: , Rfl:  .  venlafaxine XR (EFFEXOR XR) 150 MG 24 hr capsule, Take 1 capsule (150 mg total) by mouth 2 (two) times daily., Disp: 60 capsule, Rfl: 2  Allergies Cymbalta [duloxetine hcl]  Review of Systems Review of Systems - Oncology ROS as per HPI otherwise 12 point ROS is negative.   Physical Exam  Vitals Wt Readings from Last 3 Encounters:  10/01/17 169 lb (76.7 kg)  09/01/17 162 lb (73.5 kg)  06/05/17 160 lb (72.6 kg)   Temp Readings from Last 3 Encounters:  06/05/17 97.6 F (36.4 C)  12/19/16 98.2 F (36.8 C) (Oral)  12/13/16 97.7 F (36.5 C) (Oral)   BP Readings from Last 3 Encounters:  10/01/17 121/82  09/01/17 99/65  06/05/17 (!) 151/70   Pulse Readings from Last 3 Encounters:  10/01/17 66  09/01/17 90  06/05/17 95   Constitutional: Well-developed, well-nourished, and in no distress.   HENT: Head: Normocephalic and atraumatic.  Mouth/Throat: No oropharyngeal exudate. Mucosa moist. Eyes: Pupils are equal, round, and reactive to light. Conjunctivae are normal. No scleral icterus.  Neck: Normal range of motion. Neck supple. No JVD present.  Cardiovascular: Normal rate, regular rhythm and normal heart sounds.  Exam reveals no gallop and no friction rub.   No murmur heard. Pulmonary/Chest: Effort normal and breath sounds normal. No respiratory distress. No wheezes.No rales.  Abdominal: Soft. Bowel sounds are normal. No distension. There is no tenderness. There is no guarding.  Musculoskeletal: No edema or tenderness.  Lymphadenopathy: No cervical, axillary or supraclavicular adenopathy.  Neurological: Alert and oriented to person, place, and time. No cranial nerve deficit.  Skin: Skin is warm and dry. No rash  noted. No erythema. No pallor.  Psychiatric: Affect and judgment normal.  Bilateral breast exam: Chaperone present.  Lumpectomy changes noted in the right breast.  No palpable masses noted bilaterally. Labs No visits with results within 3 Day(s) from this visit.  Latest known visit with results is:  Appointment on 06/05/2017  Component Date Value Ref Range Status  . WBC 06/05/2017 7.5  4.0 - 10.5 K/uL Final  . RBC 06/05/2017 4.47  3.87 - 5.11 MIL/uL Final  . Hemoglobin 06/05/2017 13.6  12.0 - 15.0 g/dL Final  . HCT 06/05/2017 40.7  36.0 - 46.0 % Final  . MCV 06/05/2017 91.1  78.0 - 100.0 fL Final  . MCH 06/05/2017 30.4  26.0 - 34.0 pg Final  . MCHC 06/05/2017 33.4  30.0 - 36.0 g/dL Final  . RDW 06/05/2017 12.9  11.5 - 15.5 % Final  . Platelets 06/05/2017 279  150 - 400 K/uL Final  . Neutrophils Relative % 06/05/2017 71  % Final  . Neutro Abs 06/05/2017 5.3  1.7 - 7.7 K/uL Final  . Lymphocytes Relative 06/05/2017 22  % Final  . Lymphs Abs 06/05/2017 1.7  0.7 - 4.0 K/uL Final  . Monocytes Relative 06/05/2017 6  % Final  . Monocytes Absolute 06/05/2017 0.5  0.1 - 1.0 K/uL Final  . Eosinophils Relative 06/05/2017 1  % Final  . Eosinophils Absolute 06/05/2017 0.1  0.0 - 0.7 K/uL Final  . Basophils Relative 06/05/2017 0  % Final  . Basophils Absolute 06/05/2017 0.0  0.0 - 0.1 K/uL Final  . Sodium 06/05/2017 136  135 - 145 mmol/L Final  . Potassium 06/05/2017 4.3  3.5 - 5.1 mmol/L Final  . Chloride 06/05/2017 101  101 - 111 mmol/L Final  . CO2 06/05/2017 27  22 - 32 mmol/L Final  . Glucose, Bld 06/05/2017 131* 65 - 99 mg/dL Final  . BUN 06/05/2017 11  6 - 20 mg/dL Final  . Creatinine, Ser 06/05/2017 0.77  0.44 - 1.00 mg/dL Final  . Calcium 06/05/2017 9.3  8.9 - 10.3 mg/dL Final  . Total Protein 06/05/2017 6.9  6.5 - 8.1 g/dL Final  . Albumin 06/05/2017 3.9  3.5 - 5.0 g/dL Final  . AST 06/05/2017 20  15 - 41 U/L Final  . ALT 06/05/2017 13* 14 - 54 U/L Final  . Alkaline Phosphatase  06/05/2017 59  38 - 126 U/L Final  . Total Bilirubin 06/05/2017 0.5  0.3 - 1.2 mg/dL Final  . GFR calc non Af Amer 06/05/2017 >60  >60 mL/min Final  .  GFR calc Af Amer 06/05/2017 >60  >60 mL/min Final   Comment: (NOTE) The eGFR has been calculated using the CKD EPI equation. This calculation has not been validated in all clinical situations. eGFR's persistently <60 mL/min signify possible Chronic Kidney Disease.   . Anion gap 06/05/2017 8  5 - 15 Final     Pathology Orders Placed This Encounter  Procedures  . CBC with Differential/Platelet    Standing Status:   Future    Standing Expiration Date:   10/29/2018  . Comprehensive metabolic panel    Standing Status:   Future    Standing Expiration Date:   10/29/2018  . Lactate dehydrogenase    Standing Status:   Future    Standing Expiration Date:   10/29/2018       Zoila Shutter MD

## 2017-11-06 ENCOUNTER — Ambulatory Visit: Payer: Medicare HMO | Admitting: Orthopedic Surgery

## 2017-11-10 ENCOUNTER — Ambulatory Visit: Payer: Medicare HMO | Admitting: Orthopedic Surgery

## 2017-11-11 ENCOUNTER — Encounter: Payer: Self-pay | Admitting: Orthopedic Surgery

## 2017-11-11 ENCOUNTER — Ambulatory Visit: Payer: Medicare HMO | Admitting: Orthopedic Surgery

## 2017-11-11 NOTE — Progress Notes (Deleted)
Progress Note   Patient ID: ZAHARA REMBERT, female   DOB: 01/28/1936, 82 y.o.   MRN: [id number]  No chief complaint on file.   HPI   ROS No outpatient medications have been marked as taking for the 11/11/17 encounter (Appointment) with [name], MD.    Past Medical History:  Diagnosis Date  . Anxiety   . Arthritis   . Back pain   . Colon polyps   . Depression   . Diabetes mellitus, type II ([place])    diet controlled  . Hypercholesterolemia   . Invasive ductal carcinoma of breast, female, right ([place])   . Osteopenia 11/26/2016  . Skin-picking disorder    has 3 open wounds on right wrist that are being treated. About a quarter in size.     Allergies  Allergen Reactions  . Cymbalta [Duloxetine Hcl] Other (See Comments)    Hallucinated and couldn't think; got worse and worse the longer she took this.    There were no vitals taken for this visit.   Physical Exam  Ortho Exam   Medical decision-making  Imaging:  Xrays ordered: y/n ? ***  Encounter Diagnoses  Name Primary?  . Effusion, right knee Yes  . S/P right knee arthroscopy   . Primary osteoarthritis of right knee       No orders of the defined types were placed in this encounter.    Arther Abbott, MD 11/11/2017 8:39 AM

## 2017-11-17 ENCOUNTER — Ambulatory Visit (HOSPITAL_COMMUNITY): Payer: Medicare HMO | Admitting: Licensed Clinical Social Worker

## 2017-11-26 ENCOUNTER — Ambulatory Visit (HOSPITAL_COMMUNITY)
Admission: RE | Admit: 2017-11-26 | Discharge: 2017-11-26 | Disposition: A | Discharge status: Home / Self Care | Payer: Medicare HMO | Source: Ambulatory Visit | Attending: Neurosurgery | Admitting: Neurosurgery

## 2017-11-26 DIAGNOSIS — Z13820 Encounter for screening for osteoporosis: Secondary | ICD-10-CM | POA: Insufficient documentation

## 2017-11-26 DIAGNOSIS — M8580 Other specified disorders of bone density and structure, unspecified site: Secondary | ICD-10-CM | POA: Insufficient documentation

## 2017-11-26 DIAGNOSIS — Z780 Asymptomatic menopausal state: Secondary | ICD-10-CM | POA: Diagnosis not present

## 2017-11-26 DIAGNOSIS — M8589 Other specified disorders of bone density and structure, multiple sites: Secondary | ICD-10-CM | POA: Diagnosis not present

## 2017-11-26 DIAGNOSIS — M810 Age-related osteoporosis without current pathological fracture: Secondary | ICD-10-CM

## 2017-12-03 ENCOUNTER — Other Ambulatory Visit: Payer: Self-pay | Admitting: Neurosurgery

## 2017-12-03 DIAGNOSIS — M4156 Other secondary scoliosis, lumbar region: Secondary | ICD-10-CM | POA: Diagnosis not present

## 2017-12-03 DIAGNOSIS — M4310 Spondylolisthesis, site unspecified: Secondary | ICD-10-CM | POA: Diagnosis not present

## 2017-12-03 DIAGNOSIS — Z6828 Body mass index (BMI) 28.0-28.9, adult: Secondary | ICD-10-CM | POA: Diagnosis not present

## 2017-12-04 ENCOUNTER — Other Ambulatory Visit: Payer: Self-pay

## 2017-12-04 ENCOUNTER — Inpatient Hospital Stay (HOSPITAL_COMMUNITY): Payer: Medicare HMO

## 2017-12-04 ENCOUNTER — Inpatient Hospital Stay (HOSPITAL_COMMUNITY): Payer: Medicare HMO | Attending: Internal Medicine

## 2017-12-04 DIAGNOSIS — M8580 Other specified disorders of bone density and structure, unspecified site: Secondary | ICD-10-CM | POA: Diagnosis not present

## 2017-12-04 DIAGNOSIS — C50911 Malignant neoplasm of unspecified site of right female breast: Secondary | ICD-10-CM | POA: Diagnosis not present

## 2017-12-04 LAB — CBC WITH DIFFERENTIAL/PLATELET
Basophils Absolute: 0 10*3/uL (ref 0.0–0.1)
Basophils Relative: 0 %
Eosinophils Absolute: 0.1 10*3/uL (ref 0.0–0.7)
Eosinophils Relative: 2 %
HCT: 42.5 % (ref 36.0–46.0)
Hemoglobin: 13.7 g/dL (ref 12.0–15.0)
Lymphocytes Relative: 27 %
Lymphs Abs: 1.9 10*3/uL (ref 0.7–4.0)
MCH: 29.8 pg (ref 26.0–34.0)
MCHC: 32.2 g/dL (ref 30.0–36.0)
MCV: 92.6 fL (ref 78.0–100.0)
Monocytes Absolute: 0.5 10*3/uL (ref 0.1–1.0)
Monocytes Relative: 7 %
Neutro Abs: 4.7 10*3/uL (ref 1.7–7.7)
Neutrophils Relative %: 64 %
Platelets: 238 10*3/uL (ref 150–400)
RBC: 4.59 MIL/uL (ref 3.87–5.11)
RDW: 12.9 % (ref 11.5–15.5)
WBC: 7.3 10*3/uL (ref 4.0–10.5)

## 2017-12-04 LAB — COMPREHENSIVE METABOLIC PANEL
ALT: 13 U/L — ABNORMAL LOW (ref 14–54)
AST: 19 U/L (ref 15–41)
Albumin: 4.2 g/dL (ref 3.5–5.0)
Alkaline Phosphatase: 55 U/L (ref 38–126)
Anion gap: 9 (ref 5–15)
BUN: 23 mg/dL — ABNORMAL HIGH (ref 6–20)
CO2: 28 mmol/L (ref 22–32)
Calcium: 9.1 mg/dL (ref 8.9–10.3)
Chloride: 101 mmol/L (ref 101–111)
Creatinine, Ser: 1.04 mg/dL — ABNORMAL HIGH (ref 0.44–1.00)
GFR calc Af Amer: 58 mL/min — ABNORMAL LOW (ref >60)
GFR calc non Af Amer: 50 mL/min — ABNORMAL LOW (ref >60)
Glucose, Bld: 208 mg/dL — ABNORMAL HIGH (ref 65–99)
Potassium: 4.7 mmol/L (ref 3.5–5.1)
Sodium: 138 mmol/L (ref 135–145)
Total Bilirubin: 0.6 mg/dL (ref 0.3–1.2)
Total Protein: 7 g/dL (ref 6.5–8.1)

## 2017-12-04 LAB — LACTATE DEHYDROGENASE: LDH: 184 U/L (ref 98–192)

## 2017-12-04 MED ORDER — DENOSUMAB 60 MG/ML ~~LOC~~ SOSY
60.0000 mg | PREFILLED_SYRINGE | Freq: Once | SUBCUTANEOUS | Status: AC
  Administered 2017-12-04: 60 mg via SUBCUTANEOUS
  Filled 2017-12-04: qty 1

## 2017-12-04 NOTE — Progress Notes (Signed)
[name] presents today for injection per the provider's orders.  Prolia administration without incident; see MAR for injection details.  Patient tolerated procedure well and without incident.  No questions or complaints noted at this time.  Discharged ambulatory.

## 2017-12-08 ENCOUNTER — Encounter: Payer: Self-pay | Admitting: Orthopedic Surgery

## 2017-12-08 ENCOUNTER — Ambulatory Visit: Payer: Medicare HMO | Admitting: Orthopedic Surgery

## 2017-12-08 VITALS — BP 159/106 | HR 82 | Ht 64.0 in | Wt 167.0 lb

## 2017-12-08 DIAGNOSIS — M25461 Effusion, right knee: Secondary | ICD-10-CM

## 2017-12-08 DIAGNOSIS — M25561 Pain in right knee: Secondary | ICD-10-CM | POA: Diagnosis not present

## 2017-12-08 DIAGNOSIS — M1711 Unilateral primary osteoarthritis, right knee: Secondary | ICD-10-CM | POA: Diagnosis not present

## 2017-12-08 DIAGNOSIS — Z9889 Other specified postprocedural states: Secondary | ICD-10-CM | POA: Diagnosis not present

## 2017-12-08 DIAGNOSIS — G8929 Other chronic pain: Secondary | ICD-10-CM | POA: Diagnosis not present

## 2017-12-08 NOTE — Progress Notes (Signed)
Progress Note   Patient ID: Amy [name], female   DOB: July 16, 1936, 82 y.o.   MRN: [id number] Chief Complaint  Patient presents with  . Knee Pain    right      Medical decision-making  Imaging:  Xrays ordered: y/n ? no  Encounter Diagnoses  Name Primary?  . Primary osteoarthritis of right knee   . S/P right knee arthroscopy   . Chronic pain of right knee   . Effusion, right knee Yes      No orders of the defined types were placed in this encounter.   PLAN: Aspiration injection right knee Procedure note injection and aspiration right knee joint  Verbal consent was obtained to aspirate and inject the right knee joint   Timeout was completed to confirm the site of aspiration and injection  An 18-gauge needle was used to aspirate the knee joint from a suprapatellar lateral approach.  The medications used were 40 mg of Depo-Medrol and 1% lidocaine 3 cc  Anesthesia was provided by ethyl chloride and the skin was prepped with alcohol.  After cleaning the skin with alcohol an 18-gauge needle was used to aspirate the right knee joint.  We obtained 70  cc of fluid, clear fluid  We follow this by injection of 40 mg of Depo-Medrol and 3 cc 1% lidocaine.  There were no complications. A sterile bandage was applied.     Arther Abbott, MD 12/08/2017 2:17 PM     Chief Complaint  Patient presents with  . Knee Pain    right      82 year old female had a knee arthroscopy elsewhere did not do well continue to have pain in the lateral joint line x-rays show severe arthritis of the lateral compartment with near bone-on-bone changes subchondral sclerosis  She is scheduled for lumbar fusion  She presents with pain and swelling of the right knee decreased range of motion.  She says is the worst pain she is ever had she is on oxycodone 7.5 without relief    ROS  Urinary incontinence   Current Meds  Medication Sig  . ALPRAZolam (XANAX) 0.5 MG tablet Take 1 tablet  (0.5 mg total) by mouth 3 (three) times daily as needed for anxiety.  . Calcium Carb-Cholecalciferol (OYSTER CALCIUM/D3) 500-200 MG-UNIT TABS Take 2 tablets by mouth daily.  . cholecalciferol (VITAMIN D) 1000 units tablet Take 1 tablet (1,000 Units total) by mouth daily. (Patient taking differently: Take 2,000 Units by mouth daily. )  . denosumab (PROLIA) 60 MG/ML SOLN injection Inject 60 mg into the skin every 6 (six) months. Administer in upper arm, thigh, or abdomen  . docusate sodium (COLACE) 100 MG capsule Take 100 mg by mouth as needed for mild constipation.  Marland Kitchen escitalopram (LEXAPRO) 20 MG tablet Take 1 tablet (20 mg total) by mouth daily.  Marland Kitchen letrozole (FEMARA) 2.5 MG tablet Take 1 tablet (2.5 mg total) daily by mouth.  . metFORMIN (GLUCOPHAGE) 500 MG tablet Take 500 mg by mouth 2 (two) times daily with a meal.   . ONE TOUCH ULTRA TEST test strip USE TO TEST BLOOD SUGAR ONCE DAILY  . oxyCODONE-acetaminophen (PERCOCET) 7.5-325 MG tablet   . risperiDONE (RISPERDAL) 1 MG tablet Take 1 tablet (1 mg total) by mouth at bedtime.  . traZODone (DESYREL) 50 MG tablet Take 1 tablet (50 mg total) by mouth at bedtime.  . valACYclovir (VALTREX) 500 MG tablet Take 1,000 mg by mouth See admin instructions. During outbreak - pt takes 2 tabs  for 3 days   . venlafaxine XR (EFFEXOR XR) 150 MG 24 hr capsule Take 1 capsule (150 mg total) by mouth 2 (two) times daily.    Past Medical History:  Diagnosis Date  . Anxiety   . Arthritis   . Back pain   . Colon polyps   . Depression   . Diabetes mellitus, type II ([place])    diet controlled  . Hypercholesterolemia   . Invasive ductal carcinoma of breast, female, right ([place])   . Osteopenia 11/26/2016  . Skin-picking disorder    has 3 open wounds on right wrist that are being treated. About a quarter in size.     Allergies  Allergen Reactions  . Cymbalta [Duloxetine Hcl] Other (See Comments)    Hallucinated and couldn't think; got worse and worse the longer  she took this.    BP (!) 159/106   Pulse 82   Ht 5\' 4"  (1.626 m)   Wt 167 lb (75.8 kg)   BMI 28.67 kg/m     Physical Exam  Constitutional: She is oriented to person, place, and time. She appears well-developed and well-nourished.  Neurological: She is alert and oriented to person, place, and time.  Psychiatric: Her mood appears anxious. Cognition and memory are impaired. She exhibits a depressed mood.  Vitals reviewed.   Ortho Exam Muscle tone normal Right knee effusion moderate to large valgus alignment lateral tenderness decreased flexion knee stable

## 2017-12-16 ENCOUNTER — Other Ambulatory Visit (HOSPITAL_COMMUNITY): Payer: Self-pay | Admitting: Psychiatry

## 2017-12-23 ENCOUNTER — Telehealth: Payer: Self-pay | Admitting: Orthopedic Surgery

## 2017-12-23 NOTE — Telephone Encounter (Signed)
Is there any way to confirm that they actually declined certification for the surgery, that does not seem accurate

## 2017-12-23 NOTE — Telephone Encounter (Signed)
Patient wanted to be sure Dr. Aline Brochure is aware that she will not be having back surgery. Stated that she was told that her insurance declined it. Her knee is hurting her real bad, so I have put her on the schedule for Friday (5/31)

## 2017-12-23 NOTE — Pre-Procedure Instructions (Addendum)
Amy [name]  12/23/2017      [facility] - [place], [place] - [address] AT [address] [place] [address] Phone: [phone] Fax: [phone]    Your procedure is scheduled on June 4  Report to [facility] at 0600 A.M.  Call this number if you have problems the morning of surgery:  [phone]   Remember:  NOTHING TO EAT OR DRINK AFTER MIDNIGHT    Take these medicines the morning of surgery with A SIP OF WATER  acetaminophen (TYLENOL)  ALPRAZolam (XANAX)  escitalopram (LEXAPRO) letrozole (FEMARA) oxyCODONE-acetaminophen (PERCOCET venlafaxine XR (EFFEXOR XR)   7 days prior to surgery STOP taking any Aspirin(unless otherwise instructed by your surgeon), Aleve, Naproxen, Ibuprofen, Motrin, Advil, Goody's, BC's, all herbal medications, fish oil, and all vitamins    WHAT DO I DO ABOUT MY DIABETES MEDICATION?   Marland Kitchen Do not take oral diabetes medicines (pills) the morning of surgery. METFORMIN   How to Manage Your Diabetes Before and After Surgery  Why is it important to control my blood sugar before and after surgery? . Improving blood sugar levels before and after surgery helps healing and can limit problems. . A way of improving blood sugar control is eating a healthy diet by: o  Eating less sugar and carbohydrates o  Increasing activity/exercise o  Talking with your doctor about reaching your blood sugar goals . High blood sugars (greater than 180 mg/dL) can raise your risk of infections and slow your recovery, so you will need to focus on controlling your diabetes during the weeks before surgery. . Make sure that the doctor who takes care of your diabetes knows about your planned surgery including the date and location.  How do I manage my blood sugar before surgery? . Check your blood sugar at least 4 times a day, starting 2 days before surgery, to make sure that the level  is not too high or low. o Check your blood sugar the morning of your surgery when you wake up and every 2 hours until you get to the Short Stay unit. . If your blood sugar is less than 70 mg/dL, you will need to treat for low blood sugar: o Do not take insulin. o Treat a low blood sugar (less than 70 mg/dL) with  cup of clear juice (cranberry or apple), 4 glucose tablets, OR glucose gel. o Recheck blood sugar in 15 minutes after treatment (to make sure it is greater than 70 mg/dL). If your blood sugar is not greater than 70 mg/dL on recheck, call [phone] for further instructions. . Report your blood sugar to the short stay nurse when you get to Short Stay.  . If you are admitted to the hospital after surgery: o Your blood sugar will be checked by the staff and you will probably be given insulin after surgery (instead of oral diabetes medicines) to make sure you have good blood sugar levels. o The goal for blood sugar control after surgery is 80-180 mg/dL.      Do not wear jewelry, make-up or nail polish.  Do not wear lotions, powders, or perfumes, or deodorant.  Do not shave 48 hours prior to surgery.    Do not bring valuables to the hospital.  [facility] is not responsible for any belongings or valuables.  Contacts, dentures or bridgework may not be worn into surgery.  Leave your  suitcase in the car.  After surgery it may be brought to your room.  For patients admitted to the hospital, discharge time will be determined by your treatment team.  Patients discharged the day of surgery will not be allowed to drive home.    Special instructions:   [facility]- Preparing For Surgery  Before surgery, you can play an important role. Because skin is not sterile, your skin needs to be as free of germs as possible. You can reduce the number of germs on your skin by washing with CHG (chlorahexidine gluconate) Soap before surgery.  CHG is an antiseptic cleaner which kills germs and bonds  with the skin to continue killing germs even after washing.    Oral Hygiene is also important to reduce your risk of infection.  Remember - BRUSH YOUR TEETH THE MORNING OF SURGERY WITH YOUR REGULAR TOOTHPASTE  Please do not use if you have an allergy to CHG or antibacterial soaps. If your skin becomes reddened/irritated stop using the CHG.  Do not shave (including legs and underarms) for at least 48 hours prior to first CHG shower. It is OK to shave your face.  Please follow these instructions carefully.   1. Shower the NIGHT BEFORE SURGERY and the MORNING OF SURGERY with CHG.   2. If you chose to wash your hair, wash your hair first as usual with your normal shampoo.  3. After you shampoo, rinse your hair and body thoroughly to remove the shampoo.  4. Use CHG as you would any other liquid soap. You can apply CHG directly to the skin and wash gently with a scrungie or a clean washcloth.   5. Apply the CHG Soap to your body ONLY FROM THE NECK DOWN.  Do not use on open wounds or open sores. Avoid contact with your eyes, ears, mouth and genitals (private parts). Wash Face and genitals (private parts)  with your normal soap.  6. Wash thoroughly, paying special attention to the area where your surgery will be performed.  7. Thoroughly rinse your body with warm water from the neck down.  8. DO NOT shower/wash with your normal soap after using and rinsing off the CHG Soap.  9. Pat yourself dry with a CLEAN TOWEL.  10. Wear CLEAN PAJAMAS to bed the night before surgery, wear comfortable clothes the morning of surgery  11. Place CLEAN SHEETS on your bed the night of your first shower and DO NOT SLEEP WITH PETS.    Day of Surgery:  Do not apply any deodorants/lotions.  Please wear clean clothes to the hospital/surgery center.   Remember to brush your teeth WITH YOUR REGULAR TOOTHPASTE.    Please read over the following fact sheets that you were given.

## 2017-12-23 NOTE — Telephone Encounter (Signed)
Ok but her knee pain is from her back

## 2017-12-24 ENCOUNTER — Inpatient Hospital Stay (HOSPITAL_COMMUNITY)
Admission: RE | Admit: 2017-12-24 | Discharge: 2017-12-24 | Disposition: A | Discharge status: Home / Self Care | Payer: Self-pay | Source: Ambulatory Visit

## 2017-12-24 NOTE — Telephone Encounter (Signed)
Called Dr. Marchelle Folks office and spoke with Thayer Headings, who ask Lexine Baton, surgery scheduler, and she stated the [employer] did deny her back surgery.

## 2017-12-26 ENCOUNTER — Other Ambulatory Visit (HOSPITAL_COMMUNITY): Payer: Self-pay | Admitting: Psychiatry

## 2017-12-26 ENCOUNTER — Telehealth (HOSPITAL_COMMUNITY): Payer: Self-pay | Admitting: *Deleted

## 2017-12-26 ENCOUNTER — Ambulatory Visit: Payer: Medicare HMO | Admitting: Orthopedic Surgery

## 2017-12-26 MED ORDER — TRAZODONE HCL 50 MG PO TABS: 50.0000 mg | ORAL_TABLET | Freq: Every day | ORAL | 2 refills | Status: DC

## 2017-12-26 MED ORDER — ESCITALOPRAM OXALATE 20 MG PO TABS: 20.0000 mg | ORAL_TABLET | Freq: Every day | ORAL | 2 refills | Status: DC

## 2017-12-26 MED ORDER — RISPERIDONE 1 MG PO TABS: 1.0000 mg | ORAL_TABLET | Freq: Every day | ORAL | 2 refills | Status: DC

## 2017-12-26 MED ORDER — VENLAFAXINE HCL ER 150 MG PO CP24
150.0000 mg | ORAL_CAPSULE | Freq: Two times a day (BID) | ORAL | 2 refills | Status: DC
Start: 2017-12-26 — End: 2018-07-14

## 2017-12-26 MED ORDER — ALPRAZOLAM 0.5 MG PO TABS
ORAL_TABLET | ORAL | 0 refills | Status: DC
Start: 2017-12-26 — End: 2018-01-01
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
## 2017-12-26 NOTE — Telephone Encounter (Signed)
Patient  No Answer( will continue to call) , [facility] received the scripts but have received  Rejection notice's from the insurance company Alprazolam, Risperdal, Trazodone to Early to refill per [employer]. Accepted  Lexapro &  Venlaxfine.

## 2017-12-26 NOTE — Telephone Encounter (Signed)
Dr [name]  Patient stated (her word's) all my  Medication I get from Dr [name]
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
## 2017-12-26 NOTE — Telephone Encounter (Signed)
I resent all [facility]s to [facility],. Please let her know. Please call [facility] to make sure they will fill them

## 2017-12-26 NOTE — Telephone Encounter (Signed)
This is dr. Alan Ripper patient

## 2017-12-26 NOTE — Telephone Encounter (Signed)
Spoke with Larene Beach Pharm-D & she stated that she would do a over ride. Issue is controlled substances   aren't usually allowed. Did inform that the provider is concerned patient may decompensate without medications. She did say she would try to help getting medications

## 2017-12-26 NOTE — Telephone Encounter (Signed)
Spoke with patient & made her aware that she may incur a cost for medications due to insurance ejections & early refills. She said she has no problem paying a few extra dollars to get her medication
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
## 2017-12-30 ENCOUNTER — Inpatient Hospital Stay (HOSPITAL_COMMUNITY): Admission: RE | Admit: 2017-12-30 | Payer: Medicare HMO | Source: Ambulatory Visit | Admitting: Neurosurgery

## 2017-12-30 ENCOUNTER — Encounter (HOSPITAL_COMMUNITY): Admission: RE | Payer: Self-pay | Source: Ambulatory Visit

## 2017-12-30 SURGERY — POSTERIOR LUMBAR FUSION 2 LEVEL
Anesthesia: General | Site: Back

## 2017-12-31 ENCOUNTER — Other Ambulatory Visit: Payer: Self-pay | Admitting: Neurosurgery

## 2017-12-31 ENCOUNTER — Ambulatory Visit: Payer: Self-pay | Admitting: Orthopedic Surgery

## 2018-01-01 ENCOUNTER — Encounter (HOSPITAL_COMMUNITY): Payer: Self-pay | Admitting: Psychiatry

## 2018-01-01 ENCOUNTER — Ambulatory Visit (HOSPITAL_COMMUNITY): Payer: Medicare HMO | Admitting: Psychiatry

## 2018-01-01 VITALS — BP 135/77 | HR 83 | Ht 64.0 in | Wt 166.0 lb

## 2018-01-01 DIAGNOSIS — Z818 Family history of other mental and behavioral disorders: Secondary | ICD-10-CM | POA: Diagnosis not present

## 2018-01-01 DIAGNOSIS — F419 Anxiety disorder, unspecified: Secondary | ICD-10-CM | POA: Diagnosis not present

## 2018-01-01 DIAGNOSIS — F330 Major depressive disorder, recurrent, mild: Secondary | ICD-10-CM | POA: Diagnosis not present

## 2018-01-01 DIAGNOSIS — Z91410 Personal history of adult physical and sexual abuse: Secondary | ICD-10-CM | POA: Diagnosis not present

## 2018-01-01 DIAGNOSIS — Z87891 Personal history of nicotine dependence: Secondary | ICD-10-CM | POA: Diagnosis not present

## 2018-01-01 DIAGNOSIS — M2550 Pain in unspecified joint: Secondary | ICD-10-CM

## 2018-01-01 DIAGNOSIS — M549 Dorsalgia, unspecified: Secondary | ICD-10-CM

## 2018-01-01 DIAGNOSIS — Z811 Family history of alcohol abuse and dependence: Secondary | ICD-10-CM

## 2018-01-01 DIAGNOSIS — R69 Illness, unspecified: Secondary | ICD-10-CM | POA: Diagnosis not present

## 2018-01-01 MED ORDER — TRAZODONE HCL 50 MG PO TABS: 50.0000 mg | ORAL_TABLET | Freq: Every day | ORAL | 2 refills | Status: DC

## 2018-01-01 MED ORDER — ALPRAZOLAM 0.5 MG PO TABS: ORAL_TABLET | ORAL | 2 refills | Status: DC

## 2018-01-01 MED ORDER — RISPERIDONE 1 MG PO TABS: 1.0000 mg | ORAL_TABLET | Freq: Every day | ORAL | 2 refills | Status: DC

## 2018-01-01 MED ORDER — ESCITALOPRAM OXALATE 20 MG PO TABS
20.0000 mg | ORAL_TABLET | Freq: Every day | ORAL | 2 refills | Status: DC
Start: 2018-01-01 — End: 2018-04-14
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
## 2018-01-07 ENCOUNTER — Ambulatory Visit (HOSPITAL_COMMUNITY): Payer: Medicare HMO | Admitting: Licensed Clinical Social Worker

## 2018-01-07 ENCOUNTER — Encounter (HOSPITAL_COMMUNITY): Payer: Self-pay | Admitting: Licensed Clinical Social Worker

## 2018-01-07 DIAGNOSIS — F330 Major depressive disorder, recurrent, mild: Secondary | ICD-10-CM

## 2018-01-07 DIAGNOSIS — R69 Illness, unspecified: Secondary | ICD-10-CM | POA: Diagnosis not present
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
## 2018-01-13 DIAGNOSIS — L851 Acquired keratosis [keratoderma] palmaris et plantaris: Secondary | ICD-10-CM | POA: Diagnosis not present

## 2018-01-13 DIAGNOSIS — B351 Tinea unguium: Secondary | ICD-10-CM | POA: Diagnosis not present

## 2018-01-13 DIAGNOSIS — E1142 Type 2 diabetes mellitus with diabetic polyneuropathy: Secondary | ICD-10-CM | POA: Diagnosis not present

## 2018-01-14 ENCOUNTER — Encounter: Payer: Self-pay | Admitting: Orthopedic Surgery

## 2018-01-14 ENCOUNTER — Ambulatory Visit: Payer: Medicare HMO | Admitting: Orthopedic Surgery

## 2018-01-14 VITALS — BP 143/83 | HR 94 | Ht 64.0 in | Wt 167.0 lb

## 2018-01-14 DIAGNOSIS — Z9889 Other specified postprocedural states: Secondary | ICD-10-CM

## 2018-01-14 DIAGNOSIS — M1711 Unilateral primary osteoarthritis, right knee: Secondary | ICD-10-CM

## 2018-01-14 DIAGNOSIS — M4716 Other spondylosis with myelopathy, lumbar region: Secondary | ICD-10-CM

## 2018-01-14 DIAGNOSIS — G8929 Other chronic pain: Secondary | ICD-10-CM

## 2018-01-14 DIAGNOSIS — M25561 Pain in right knee: Secondary | ICD-10-CM

## 2018-01-14 NOTE — Progress Notes (Signed)
   Chief Complaint  Patient presents with  . Knee Pain    right     hpi h/o spinal stenosis and right knee OA   Scheduled for surgery: Lumbar fusion   requests injection right knee  Procedure note right knee injection verbal consent was obtained to inject right knee joint  Timeout was completed to confirm the site of injection  The medications used were 40 mg of Depo-Medrol and 1% lidocaine 3 cc  Anesthesia was provided by ethyl chloride and the skin was prepped with alcohol.  After cleaning the skin with alcohol a 20-gauge needle was used to inject the right knee joint. There were no complications. A sterile bandage was applied.  Encounter Diagnoses  Name Primary?  . Primary osteoarthritis of right knee Yes  . S/P right knee arthroscopy   . Chronic pain of right knee   . Lumbar spondylosis with myelopathy    Fu prn

## 2018-01-15 NOTE — Pre-Procedure Instructions (Signed)
Amy [name]  01/15/2018      [id number] - [place], [place] - [address] AT [place] [address] [place] [address] Phone: [phone] Fax: [phone]    Your procedure is scheduled on July 1  Report to [facility] at [phone] A.M.  Call this number if you have problems the morning of surgery:  [phone]   Remember:  NOTHING TO EAT OR DRINK AFTER MIDNIGHT    Take these medicines the morning of surgery with A SIP OF WATER  acetaminophen (TYLENOL)  ALPRAZolam (XANAX)  escitalopram (LEXAPRO)  oxyCODONE-acetaminophen (PERCOCET) valACYclovir (VALTREX)  7 days prior to surgery STOP taking any Aspirin(unless otherwise instructed by your surgeon), Aleve, Naproxen, Ibuprofen, Motrin, Advil, Goody's, BC's, all herbal medications, fish oil, and all vitamins     Do not wear jewelry, make-up or nail polish.  Do not wear lotions, powders, or perfumes, or deodorant.  Do not shave 48 hours prior to surgery.    Do not bring valuables to the hospital.  [facility] is not responsible for any belongings or valuables.  Contacts, dentures or bridgework may not be worn into surgery.  Leave your suitcase in the car.  After surgery it may be brought to your room.  For patients admitted to the hospital, discharge time will be determined by your treatment team.  Patients discharged the day of surgery will not be allowed to drive home.    Special instructions:   [facility]- Preparing For Surgery  Before surgery, you can play an important role. Because skin is not sterile, your skin needs to be as free of germs as possible. You can reduce the number of germs on your skin by washing with CHG (chlorahexidine gluconate) Soap before surgery.  CHG is an antiseptic cleaner which kills germs and bonds with the skin to continue killing germs even after washing.    Oral Hygiene is also important to reduce your risk of  infection.  Remember - BRUSH YOUR TEETH THE MORNING OF SURGERY WITH YOUR REGULAR TOOTHPASTE  Please do not use if you have an allergy to CHG or antibacterial soaps. If your skin becomes reddened/irritated stop using the CHG.  Do not shave (including legs and underarms) for at least 48 hours prior to first CHG shower. It is OK to shave your face.  Please follow these instructions carefully.   1. Shower the NIGHT BEFORE SURGERY and the MORNING OF SURGERY with CHG.   2. If you chose to wash your hair, wash your hair first as usual with your normal shampoo.  3. After you shampoo, rinse your hair and body thoroughly to remove the shampoo.  4. Use CHG as you would any other liquid soap. You can apply CHG directly to the skin and wash gently with a scrungie or a clean washcloth.   5. Apply the CHG Soap to your body ONLY FROM THE NECK DOWN.  Do not use on open wounds or open sores. Avoid contact with your eyes, ears, mouth and genitals (private parts). Wash Face and genitals (private parts)  with your normal soap.  6. Wash thoroughly, paying special attention to the area where your surgery will be performed.  7. Thoroughly rinse your body with warm water from the neck down.  8. DO NOT shower/wash with your normal soap after using and rinsing off the CHG Soap.  9. Pat yourself dry with a  CLEAN TOWEL.  10. Wear CLEAN PAJAMAS to bed the night before surgery, wear comfortable clothes the morning of surgery  11. Place CLEAN SHEETS on your bed the night of your first shower and DO NOT SLEEP WITH PETS.    Day of Surgery:  Do not apply any deodorants/lotions.  Please wear clean clothes to the hospital/surgery center.   Remember to brush your teeth WITH YOUR REGULAR TOOTHPASTE.   Please read over the following fact sheets that you were given.

## 2018-01-16 ENCOUNTER — Encounter (HOSPITAL_COMMUNITY)
Admission: RE | Admit: 2018-01-16 | Discharge: 2018-01-16 | Disposition: A | Discharge status: Home / Self Care | Payer: Medicare HMO | Source: Ambulatory Visit | Attending: Neurosurgery | Admitting: Neurosurgery

## 2018-01-16 ENCOUNTER — Other Ambulatory Visit: Payer: Self-pay

## 2018-01-16 ENCOUNTER — Encounter (HOSPITAL_COMMUNITY): Payer: Self-pay

## 2018-01-16 DIAGNOSIS — Z01812 Encounter for preprocedural laboratory examination: Secondary | ICD-10-CM | POA: Diagnosis not present

## 2018-01-16 DIAGNOSIS — Z01810 Encounter for preprocedural cardiovascular examination: Secondary | ICD-10-CM | POA: Insufficient documentation

## 2018-01-16 LAB — CBC WITH DIFFERENTIAL/PLATELET
Abs Immature Granulocytes: 0 10*3/uL (ref 0.0–0.1)
Basophils Absolute: 0 10*3/uL (ref 0.0–0.1)
Basophils Relative: 1 %
Eosinophils Absolute: 0.1 10*3/uL (ref 0.0–0.7)
Eosinophils Relative: 1 %
HCT: 43.6 % (ref 36.0–46.0)
Hemoglobin: 14.5 g/dL (ref 12.0–15.0)
Immature Granulocytes: 0 %
Lymphocytes Relative: 26 %
Lymphs Abs: 2.2 10*3/uL (ref 0.7–4.0)
MCH: 30.5 pg (ref 26.0–34.0)
MCHC: 33.3 g/dL (ref 30.0–36.0)
MCV: 91.6 fL (ref 78.0–100.0)
Monocytes Absolute: 0.6 10*3/uL (ref 0.1–1.0)
Monocytes Relative: 7 %
Neutro Abs: 5.5 10*3/uL (ref 1.7–7.7)
Neutrophils Relative %: 65 %
Platelets: 271 10*3/uL (ref 150–400)
RBC: 4.76 MIL/uL (ref 3.87–5.11)
RDW: 12.3 % (ref 11.5–15.5)
WBC: 8.5 10*3/uL (ref 4.0–10.5)

## 2018-01-16 LAB — SURGICAL PCR SCREEN
MRSA, PCR: NEGATIVE
Staphylococcus aureus: POSITIVE — AB

## 2018-01-16 LAB — HEMOGLOBIN A1C
Hgb A1c MFr Bld: 6.4 % — ABNORMAL HIGH (ref 4.8–5.6)
Mean Plasma Glucose: 136.98 mg/dL

## 2018-01-16 LAB — BASIC METABOLIC PANEL
Anion gap: 9 (ref 5–15)
BUN: 14 mg/dL (ref 6–20)
CO2: 29 mmol/L (ref 22–32)
Calcium: 9.6 mg/dL (ref 8.9–10.3)
Chloride: 101 mmol/L (ref 101–111)
Creatinine, Ser: 1.13 mg/dL — ABNORMAL HIGH (ref 0.44–1.00)
GFR calc Af Amer: 52 mL/min — ABNORMAL LOW (ref >60)
GFR calc non Af Amer: 45 mL/min — ABNORMAL LOW (ref >60)
Glucose, Bld: 87 mg/dL (ref 65–99)
Potassium: 4.6 mmol/L (ref 3.5–5.1)
Sodium: 139 mmol/L (ref 135–145)

## 2018-01-16 LAB — GLUCOSE, CAPILLARY: Glucose-Capillary: 88 mg/dL (ref 65–99)

## 2018-01-16 LAB — ABO/RH: ABO/RH(D): O POS

## 2018-01-16 NOTE — Progress Notes (Signed)
PCP: Sinda Du, MD  Cardiologist: denies  EKG: obtained today  Stress test: pt denies ever  ECHO: pt denies ever  Cardiac Cath: pt denies ever  Chest x-ray: pt denies past year, no recent respiratory complications

## 2018-01-21 ENCOUNTER — Other Ambulatory Visit (HOSPITAL_COMMUNITY): Payer: Self-pay | Admitting: *Deleted

## 2018-01-21 ENCOUNTER — Telehealth (HOSPITAL_COMMUNITY): Payer: Self-pay | Admitting: *Deleted

## 2018-01-21 DIAGNOSIS — C50911 Malignant neoplasm of unspecified site of right female breast: Secondary | ICD-10-CM

## 2018-01-21 MED ORDER — LETROZOLE 2.5 MG PO TABS: 2.5000 mg | ORAL_TABLET | Freq: Every day | ORAL | 3 refills | Status: DC

## 2018-01-21 NOTE — Telephone Encounter (Signed)
[name], Amy J  P Onc Nurse [facility]        Please call pt she has questions about current med list she is taking.  Call her at [phone]    Spoke with pt - pt is scheduled for back surgery on 01/26/18 is concerned after reviewing her medication list; states she is not taking Femara and does not recall ever taking it.  Upon reviewing pt's last office visit (Dr. Walden Field) note (on 10/28/17):  "She has been on Arimidex drug holiday from 04/29/17-today.She was recently seen by NP Renato Battles in 05/2017 and discussion was held regarding change of therapy to Femara 2.5 mg po daily.  Medication was reportedly e-scribed Letrozole to her pharmacy.   She is here to go over her diagnostic mammogram that was done on 10/14/2017 and was negative other than lumpectomy changes in the right breast.  She is recommended for bilateral diagnostic mammogram in March 2020.  She will return to clinic in November 2019 for follow-up as she is due for Prolia at that time.  She should continue Femara as directed and notify the office if she has any problems with the medication.".    Pt advised that she should start taking her Femara after her surgery.  New Rx with 3 refills sent to [facility] on [address] [place].

## 2018-01-26 ENCOUNTER — Inpatient Hospital Stay (HOSPITAL_COMMUNITY): Payer: Medicare HMO

## 2018-01-26 ENCOUNTER — Inpatient Hospital Stay (HOSPITAL_COMMUNITY)
Admission: RE | Admit: 2018-01-26 | Discharge: 2018-02-02 | DRG: 454 | Disposition: A | Discharge status: Skilled Nursing Facility | Payer: Medicare HMO | Source: Ambulatory Visit | Attending: Neurosurgery | Admitting: Neurosurgery

## 2018-01-26 ENCOUNTER — Inpatient Hospital Stay (HOSPITAL_COMMUNITY): Payer: Medicare HMO | Admitting: Anesthesiology

## 2018-01-26 ENCOUNTER — Encounter (HOSPITAL_COMMUNITY): Payer: Self-pay

## 2018-01-26 ENCOUNTER — Encounter (HOSPITAL_COMMUNITY)
Admission: RE | Disposition: A | Discharge status: Skilled Nursing Facility | Payer: Self-pay | Source: Ambulatory Visit | Attending: Neurosurgery

## 2018-01-26 DIAGNOSIS — I471 Supraventricular tachycardia: Secondary | ICD-10-CM | POA: Diagnosis not present

## 2018-01-26 DIAGNOSIS — M4317 Spondylolisthesis, lumbosacral region: Secondary | ICD-10-CM | POA: Diagnosis not present

## 2018-01-26 DIAGNOSIS — M4310 Spondylolisthesis, site unspecified: Secondary | ICD-10-CM | POA: Diagnosis not present

## 2018-01-26 DIAGNOSIS — R42 Dizziness and giddiness: Secondary | ICD-10-CM | POA: Diagnosis not present

## 2018-01-26 DIAGNOSIS — M4316 Spondylolisthesis, lumbar region: Principal | ICD-10-CM | POA: Diagnosis present

## 2018-01-26 DIAGNOSIS — E785 Hyperlipidemia, unspecified: Secondary | ICD-10-CM | POA: Diagnosis present

## 2018-01-26 DIAGNOSIS — M4156 Other secondary scoliosis, lumbar region: Secondary | ICD-10-CM | POA: Diagnosis not present

## 2018-01-26 DIAGNOSIS — Z87891 Personal history of nicotine dependence: Secondary | ICD-10-CM | POA: Diagnosis not present

## 2018-01-26 DIAGNOSIS — E7800 Pure hypercholesterolemia, unspecified: Secondary | ICD-10-CM | POA: Diagnosis not present

## 2018-01-26 DIAGNOSIS — M545 Low back pain: Secondary | ICD-10-CM | POA: Diagnosis not present

## 2018-01-26 DIAGNOSIS — G8918 Other acute postprocedural pain: Secondary | ICD-10-CM | POA: Diagnosis not present

## 2018-01-26 DIAGNOSIS — E119 Type 2 diabetes mellitus without complications: Secondary | ICD-10-CM | POA: Diagnosis present

## 2018-01-26 DIAGNOSIS — M4807 Spinal stenosis, lumbosacral region: Secondary | ICD-10-CM | POA: Diagnosis not present

## 2018-01-26 DIAGNOSIS — Z853 Personal history of malignant neoplasm of breast: Secondary | ICD-10-CM

## 2018-01-26 DIAGNOSIS — M4326 Fusion of spine, lumbar region: Secondary | ICD-10-CM | POA: Diagnosis not present

## 2018-01-26 DIAGNOSIS — R262 Difficulty in walking, not elsewhere classified: Secondary | ICD-10-CM | POA: Diagnosis not present

## 2018-01-26 DIAGNOSIS — R69 Illness, unspecified: Secondary | ICD-10-CM | POA: Diagnosis not present

## 2018-01-26 DIAGNOSIS — Z7984 Long term (current) use of oral hypoglycemic drugs: Secondary | ICD-10-CM

## 2018-01-26 DIAGNOSIS — M4186 Other forms of scoliosis, lumbar region: Secondary | ICD-10-CM | POA: Diagnosis not present

## 2018-01-26 DIAGNOSIS — Z9889 Other specified postprocedural states: Secondary | ICD-10-CM

## 2018-01-26 DIAGNOSIS — M48062 Spinal stenosis, lumbar region with neurogenic claudication: Secondary | ICD-10-CM | POA: Diagnosis not present

## 2018-01-26 DIAGNOSIS — M6281 Muscle weakness (generalized): Secondary | ICD-10-CM | POA: Diagnosis not present

## 2018-01-26 DIAGNOSIS — D62 Acute posthemorrhagic anemia: Secondary | ICD-10-CM | POA: Diagnosis not present

## 2018-01-26 DIAGNOSIS — Z419 Encounter for procedure for purposes other than remedying health state, unspecified: Secondary | ICD-10-CM

## 2018-01-26 DIAGNOSIS — Z743 Need for continuous supervision: Secondary | ICD-10-CM | POA: Diagnosis not present

## 2018-01-26 DIAGNOSIS — Z981 Arthrodesis status: Secondary | ICD-10-CM | POA: Diagnosis not present

## 2018-01-26 DIAGNOSIS — R279 Unspecified lack of coordination: Secondary | ICD-10-CM | POA: Diagnosis not present

## 2018-01-26 DIAGNOSIS — B029 Zoster without complications: Secondary | ICD-10-CM | POA: Diagnosis not present

## 2018-01-26 LAB — POCT I-STAT 4, (NA,K, GLUC, HGB,HCT)
Glucose, Bld: 171 mg/dL — ABNORMAL HIGH (ref 70–99)
Glucose, Bld: 136 mg/dL — ABNORMAL HIGH (ref 70–99)
Glucose, Bld: 184 mg/dL — ABNORMAL HIGH (ref 70–99)
HCT: 28 % — ABNORMAL LOW (ref 36.0–46.0)
HCT: 18 % — ABNORMAL LOW (ref 36.0–46.0)
HCT: 30 % — ABNORMAL LOW (ref 36.0–46.0)
Hemoglobin: 9.5 g/dL — ABNORMAL LOW (ref 12.0–15.0)
Hemoglobin: 6.1 g/dL — CL (ref 12.0–15.0)
Hemoglobin: 10.2 g/dL — ABNORMAL LOW (ref 12.0–15.0)
Potassium: 4.2 mmol/L (ref 3.5–5.1)
Potassium: 3.4 mmol/L — ABNORMAL LOW (ref 3.5–5.1)
Potassium: 4.1 mmol/L (ref 3.5–5.1)
Sodium: 139 mmol/L (ref 135–145)
Sodium: 145 mmol/L (ref 135–145)
Sodium: 141 mmol/L (ref 135–145)

## 2018-01-26 LAB — GLUCOSE, CAPILLARY
Glucose-Capillary: 115 mg/dL — ABNORMAL HIGH (ref 70–99)
Glucose-Capillary: 147 mg/dL — ABNORMAL HIGH (ref 70–99)
Glucose-Capillary: 223 mg/dL — ABNORMAL HIGH (ref 70–99)
Glucose-Capillary: 228 mg/dL — ABNORMAL HIGH (ref 70–99)

## 2018-01-26 LAB — POCT I-STAT 7, (LYTES, BLD GAS, ICA,H+H)
Bicarbonate: 25.8 mmol/L (ref 20.0–28.0)
Calcium, Ion: 1.03 mmol/L — ABNORMAL LOW (ref 1.15–1.40)
HCT: 34 % — ABNORMAL LOW (ref 36.0–46.0)
Hemoglobin: 11.6 g/dL — ABNORMAL LOW (ref 12.0–15.0)
O2 Saturation: 100 %
Patient temperature: 37.4
Potassium: 4.7 mmol/L (ref 3.5–5.1)
Sodium: 137 mmol/L (ref 135–145)
TCO2: 27 mmol/L (ref 22–32)
pCO2 arterial: 46.3 mmHg (ref 32.0–48.0)
pH, Arterial: 7.355 (ref 7.350–7.450)
pO2, Arterial: 247 mmHg — ABNORMAL HIGH (ref 83.0–108.0)

## 2018-01-26 LAB — PREPARE RBC (CROSSMATCH)

## 2018-01-26 SURGERY — POSTERIOR LUMBAR FUSION 2 LEVEL
Anesthesia: General | Site: Back

## 2018-01-26 MED ORDER — LACTATED RINGERS IV SOLN
INTRAVENOUS | Status: DC | PRN
  Administered 2018-01-26 (×2): via INTRAVENOUS

## 2018-01-26 MED ORDER — LACTATED RINGERS IV SOLN
INTRAVENOUS | Status: DC
  Administered 2018-01-26 (×3): via INTRAVENOUS

## 2018-01-26 MED ORDER — RISPERIDONE 0.5 MG PO TABS
1.0000 mg | ORAL_TABLET | Freq: Every day | ORAL | Status: DC
  Administered 2018-01-26 – 2018-02-01 (×7): 1 mg via ORAL
  Filled 2018-01-26 (×2): qty 1
  Filled 2018-01-26: qty 2
  Filled 2018-01-26 (×2): qty 1
  Filled 2018-01-26: qty 2
  Filled 2018-01-26: qty 1
  Filled 2018-01-26: qty 2

## 2018-01-26 MED ORDER — FENTANYL CITRATE (PF) 100 MCG/2ML IJ SOLN
INTRAMUSCULAR | Status: AC
  Filled 2018-01-26: qty 2

## 2018-01-26 MED ORDER — PHENYLEPHRINE HCL 10 MG/ML IJ SOLN
INTRAVENOUS | Status: DC | PRN
  Administered 2018-01-26: 10 ug/min via INTRAVENOUS
  Administered 2018-01-26: 19:00:00 via INTRAVENOUS

## 2018-01-26 MED ORDER — PHENYLEPHRINE HCL 10 MG/ML IJ SOLN
INTRAMUSCULAR | Status: AC
  Filled 2018-01-26: qty 1

## 2018-01-26 MED ORDER — BUPIVACAINE HCL (PF) 0.25 % IJ SOLN
INTRAMUSCULAR | Status: DC | PRN
  Administered 2018-01-26: 30 mL

## 2018-01-26 MED ORDER — SUGAMMADEX SODIUM 200 MG/2ML IV SOLN
INTRAVENOUS | Status: AC
  Filled 2018-01-26: qty 2

## 2018-01-26 MED ORDER — THROMBIN 20000 UNITS EX SOLR
CUTANEOUS | Status: DC | PRN
  Administered 2018-01-26: 16:00:00 via TOPICAL

## 2018-01-26 MED ORDER — FENTANYL CITRATE (PF) 250 MCG/5ML IJ SOLN
INTRAMUSCULAR | Status: AC
  Filled 2018-01-26: qty 5

## 2018-01-26 MED ORDER — METFORMIN HCL 500 MG PO TABS
500.0000 mg | ORAL_TABLET | Freq: Two times a day (BID) | ORAL | Status: DC
  Administered 2018-01-27 – 2018-02-02 (×14): 500 mg via ORAL
  Filled 2018-01-26 (×14): qty 1

## 2018-01-26 MED ORDER — ALBUMIN HUMAN 5 % IV SOLN
INTRAVENOUS | Status: DC | PRN
  Administered 2018-01-26 (×3): via INTRAVENOUS

## 2018-01-26 MED ORDER — PROMETHAZINE HCL 25 MG/ML IJ SOLN: 6.2500 mg | INTRAMUSCULAR | Status: DC | PRN

## 2018-01-26 MED ORDER — SODIUM CHLORIDE 0.9 % IV SOLN
INTRAVENOUS | Status: DC | PRN
  Administered 2018-01-26: 19:00:00 via INTRAVENOUS

## 2018-01-26 MED ORDER — SODIUM CHLORIDE 0.9% FLUSH: 3.0000 mL | INTRAVENOUS | Status: DC | PRN

## 2018-01-26 MED ORDER — SODIUM CHLORIDE 0.9 % IV SOLN
INTRAVENOUS | Status: DC | PRN
  Administered 2018-01-26: 16:00:00

## 2018-01-26 MED ORDER — TRAZODONE HCL 50 MG PO TABS
50.0000 mg | ORAL_TABLET | Freq: Every day | ORAL | Status: DC
  Administered 2018-01-26 – 2018-02-01 (×7): 50 mg via ORAL
  Filled 2018-01-26 (×8): qty 1

## 2018-01-26 MED ORDER — PHENOL 1.4 % MT LIQD: 1.0000 | OROMUCOSAL | Status: DC | PRN

## 2018-01-26 MED ORDER — POTASSIUM CHLORIDE IN NACL 20-0.9 MEQ/L-% IV SOLN
INTRAVENOUS | Status: DC
  Administered 2018-01-26 – 2018-01-30 (×4): via INTRAVENOUS
  Filled 2018-01-26 (×4): qty 1000

## 2018-01-26 MED ORDER — LIDOCAINE 2% (20 MG/ML) 5 ML SYRINGE
INTRAMUSCULAR | Status: AC
  Filled 2018-01-26: qty 10

## 2018-01-26 MED ORDER — DEXAMETHASONE SODIUM PHOSPHATE 10 MG/ML IJ SOLN
10.0000 mg | INTRAMUSCULAR | Status: AC
  Administered 2018-01-26: 10 mg via INTRAVENOUS
  Filled 2018-01-26: qty 1

## 2018-01-26 MED ORDER — ROCURONIUM BROMIDE 100 MG/10ML IV SOLN
INTRAVENOUS | Status: DC | PRN
  Administered 2018-01-26: 15 mg via INTRAVENOUS
  Administered 2018-01-26 (×4): 25 mg via INTRAVENOUS
  Administered 2018-01-26: 60 mg via INTRAVENOUS
  Administered 2018-01-26: 20 mg via INTRAVENOUS

## 2018-01-26 MED ORDER — ALPRAZOLAM 0.5 MG PO TABS: 0.5000 mg | ORAL_TABLET | Freq: Every evening | ORAL | Status: DC | PRN

## 2018-01-26 MED ORDER — ONDANSETRON HCL 4 MG PO TABS: 4.0000 mg | ORAL_TABLET | Freq: Four times a day (QID) | ORAL | Status: DC | PRN

## 2018-01-26 MED ORDER — POLYETHYLENE GLYCOL 3350 17 G PO PACK: 17.0000 g | PACK | Freq: Every day | ORAL | Status: DC | PRN

## 2018-01-26 MED ORDER — SODIUM CHLORIDE 0.9 % IJ SOLN
INTRAMUSCULAR | Status: AC
  Filled 2018-01-26: qty 10

## 2018-01-26 MED ORDER — EPHEDRINE SULFATE 50 MG/ML IJ SOLN
INTRAMUSCULAR | Status: AC
  Filled 2018-01-26: qty 1

## 2018-01-26 MED ORDER — FENTANYL CITRATE (PF) 100 MCG/2ML IJ SOLN
25.0000 ug | INTRAMUSCULAR | Status: DC | PRN
  Administered 2018-01-26: 25 ug via INTRAVENOUS
  Administered 2018-01-26: 50 ug via INTRAVENOUS
  Administered 2018-01-26: 25 ug via INTRAVENOUS

## 2018-01-26 MED ORDER — ROCURONIUM BROMIDE 10 MG/ML (PF) SYRINGE
PREFILLED_SYRINGE | INTRAVENOUS | Status: AC
  Filled 2018-01-26: qty 20

## 2018-01-26 MED ORDER — THROMBIN 20000 UNITS EX SOLR
CUTANEOUS | Status: AC
  Filled 2018-01-26: qty 20000

## 2018-01-26 MED ORDER — THROMBIN 5000 UNITS EX SOLR
CUTANEOUS | Status: AC
  Filled 2018-01-26: qty 5000

## 2018-01-26 MED ORDER — LETROZOLE 2.5 MG PO TABS
2.5000 mg | ORAL_TABLET | Freq: Every day | ORAL | Status: DC
  Administered 2018-01-28 – 2018-02-02 (×6): 2.5 mg via ORAL
  Filled 2018-01-26 (×7): qty 1

## 2018-01-26 MED ORDER — CHLORHEXIDINE GLUCONATE CLOTH 2 % EX PADS: 6.0000 | MEDICATED_PAD | Freq: Once | CUTANEOUS | Status: DC

## 2018-01-26 MED ORDER — DEXAMETHASONE SODIUM PHOSPHATE 10 MG/ML IJ SOLN
INTRAMUSCULAR | Status: AC
  Filled 2018-01-26: qty 1

## 2018-01-26 MED ORDER — ONDANSETRON HCL 4 MG/2ML IJ SOLN
INTRAMUSCULAR | Status: DC | PRN
  Administered 2018-01-26: 4 mg via INTRAVENOUS

## 2018-01-26 MED ORDER — VANCOMYCIN HCL 1 G IV SOLR
INTRAVENOUS | Status: DC | PRN
  Administered 2018-01-26: 1000 mg

## 2018-01-26 MED ORDER — SURGIFOAM 100 EX MISC
CUTANEOUS | Status: DC | PRN
  Administered 2018-01-26: 17:00:00 via TOPICAL

## 2018-01-26 MED ORDER — SUGAMMADEX SODIUM 200 MG/2ML IV SOLN
INTRAVENOUS | Status: DC | PRN
  Administered 2018-01-26: 200 mg via INTRAVENOUS

## 2018-01-26 MED ORDER — ONDANSETRON HCL 4 MG/2ML IJ SOLN: 4.0000 mg | Freq: Four times a day (QID) | INTRAMUSCULAR | Status: DC | PRN

## 2018-01-26 MED ORDER — CEFAZOLIN SODIUM-DEXTROSE 1-4 GM/50ML-% IV SOLN
1.0000 g | Freq: Three times a day (TID) | INTRAVENOUS | Status: AC
  Administered 2018-01-27 (×2): 1 g via INTRAVENOUS
  Filled 2018-01-26 (×2): qty 50

## 2018-01-26 MED ORDER — CEFAZOLIN SODIUM-DEXTROSE 2-4 GM/100ML-% IV SOLN
2.0000 g | INTRAVENOUS | Status: AC
  Administered 2018-01-26 (×2): 2 g via INTRAVENOUS
  Filled 2018-01-26: qty 100

## 2018-01-26 MED ORDER — VITAMIN D 1000 UNITS PO TABS
1000.0000 [IU] | ORAL_TABLET | Freq: Every day | ORAL | Status: DC
  Administered 2018-01-27 – 2018-02-02 (×7): 1000 [IU] via ORAL
  Filled 2018-01-26 (×7): qty 1

## 2018-01-26 MED ORDER — INSULIN ASPART 100 UNIT/ML ~~LOC~~ SOLN
0.0000 [IU] | Freq: Three times a day (TID) | SUBCUTANEOUS | Status: DC
  Administered 2018-01-27: 5 [IU] via SUBCUTANEOUS
  Administered 2018-01-27: 8 [IU] via SUBCUTANEOUS
  Administered 2018-01-28: 3 [IU] via SUBCUTANEOUS
  Administered 2018-01-28 – 2018-01-29 (×4): 2 [IU] via SUBCUTANEOUS
  Administered 2018-01-29: 3 [IU] via SUBCUTANEOUS
  Administered 2018-01-30 – 2018-01-31 (×2): 2 [IU] via SUBCUTANEOUS
  Administered 2018-01-31: 3 [IU] via SUBCUTANEOUS
  Administered 2018-01-31: 2 [IU] via SUBCUTANEOUS
  Administered 2018-02-01: 3 [IU] via SUBCUTANEOUS
  Administered 2018-02-02 (×2): 2 [IU] via SUBCUTANEOUS

## 2018-01-26 MED ORDER — 0.9 % SODIUM CHLORIDE (POUR BTL) OPTIME
TOPICAL | Status: DC | PRN
  Administered 2018-01-26: 1000 mL

## 2018-01-26 MED ORDER — HYDROMORPHONE HCL 1 MG/ML IJ SOLN
1.0000 mg | INTRAMUSCULAR | Status: DC | PRN
  Administered 2018-01-26 – 2018-01-31 (×3): 1 mg via INTRAVENOUS
  Filled 2018-01-26 (×4): qty 1

## 2018-01-26 MED ORDER — VENLAFAXINE HCL ER 75 MG PO CP24
150.0000 mg | ORAL_CAPSULE | Freq: Two times a day (BID) | ORAL | Status: DC
  Administered 2018-01-26 – 2018-02-02 (×14): 150 mg via ORAL
  Filled 2018-01-26 (×2): qty 2
  Filled 2018-01-26 (×2): qty 1
  Filled 2018-01-26 (×3): qty 2
  Filled 2018-01-26 (×3): qty 1
  Filled 2018-01-26: qty 2
  Filled 2018-01-26 (×5): qty 1

## 2018-01-26 MED ORDER — DOCUSATE SODIUM 100 MG PO CAPS
100.0000 mg | ORAL_CAPSULE | ORAL | Status: DC | PRN
  Administered 2018-02-01: 100 mg via ORAL
  Filled 2018-01-26: qty 1

## 2018-01-26 MED ORDER — FLEET ENEMA 7-19 GM/118ML RE ENEM: 1.0000 | ENEMA | Freq: Once | RECTAL | Status: DC | PRN

## 2018-01-26 MED ORDER — GELATIN ABSORBABLE MT POWD
OROMUCOSAL | Status: DC | PRN
  Administered 2018-01-26: 5 mL via TOPICAL

## 2018-01-26 MED ORDER — OXYCODONE HCL 5 MG PO TABS
10.0000 mg | ORAL_TABLET | ORAL | Status: DC | PRN
  Administered 2018-01-27 – 2018-02-02 (×13): 10 mg via ORAL
  Filled 2018-01-26 (×13): qty 2

## 2018-01-26 MED ORDER — HYDROCODONE-ACETAMINOPHEN 10-325 MG PO TABS
1.0000 | ORAL_TABLET | ORAL | Status: DC | PRN
  Administered 2018-01-27 – 2018-01-31 (×8): 1 via ORAL
  Filled 2018-01-26 (×8): qty 1

## 2018-01-26 MED ORDER — HYDROMORPHONE HCL 1 MG/ML IJ SOLN
INTRAMUSCULAR | Status: AC
  Administered 2018-01-26: 1 mg
  Filled 2018-01-26: qty 1

## 2018-01-26 MED ORDER — CALCIUM CARBONATE-VITAMIN D 500-200 MG-UNIT PO TABS
2.0000 | ORAL_TABLET | Freq: Two times a day (BID) | ORAL | Status: DC
  Administered 2018-01-26 – 2018-02-02 (×14): 2 via ORAL
  Filled 2018-01-26 (×15): qty 2

## 2018-01-26 MED ORDER — PROPOFOL 10 MG/ML IV BOLUS
INTRAVENOUS | Status: AC
  Filled 2018-01-26: qty 20

## 2018-01-26 MED ORDER — ESCITALOPRAM OXALATE 10 MG PO TABS
20.0000 mg | ORAL_TABLET | Freq: Every day | ORAL | Status: DC
  Administered 2018-01-27 – 2018-02-02 (×7): 20 mg via ORAL
  Filled 2018-01-26 (×7): qty 2

## 2018-01-26 MED ORDER — BUPIVACAINE HCL (PF) 0.25 % IJ SOLN
INTRAMUSCULAR | Status: AC
  Filled 2018-01-26: qty 30

## 2018-01-26 MED ORDER — SODIUM CHLORIDE 0.9% IV SOLUTION: Freq: Once | INTRAVENOUS | Status: DC

## 2018-01-26 MED ORDER — SODIUM CHLORIDE 0.9 % IV SOLN: 250.0000 mL | INTRAVENOUS | Status: DC

## 2018-01-26 MED ORDER — ACETAMINOPHEN 650 MG RE SUPP: 650.0000 mg | RECTAL | Status: DC | PRN

## 2018-01-26 MED ORDER — BISACODYL 10 MG RE SUPP
10.0000 mg | Freq: Every day | RECTAL | Status: DC | PRN
  Administered 2018-01-29: 10 mg via RECTAL
  Filled 2018-01-26: qty 1

## 2018-01-26 MED ORDER — FENTANYL CITRATE (PF) 100 MCG/2ML IJ SOLN
INTRAMUSCULAR | Status: DC | PRN
  Administered 2018-01-26 (×11): 50 ug via INTRAVENOUS

## 2018-01-26 MED ORDER — CEFAZOLIN SODIUM 1 G IJ SOLR
INTRAMUSCULAR | Status: AC
  Filled 2018-01-26: qty 20

## 2018-01-26 MED ORDER — PROPOFOL 10 MG/ML IV BOLUS
INTRAVENOUS | Status: DC | PRN
  Administered 2018-01-26: 130 mg via INTRAVENOUS

## 2018-01-26 MED ORDER — ROCURONIUM BROMIDE 10 MG/ML (PF) SYRINGE
PREFILLED_SYRINGE | INTRAVENOUS | Status: AC
  Filled 2018-01-26: qty 10

## 2018-01-26 MED ORDER — SODIUM CHLORIDE 0.9% FLUSH
3.0000 mL | Freq: Two times a day (BID) | INTRAVENOUS | Status: DC
  Administered 2018-01-26 – 2018-02-02 (×11): 3 mL via INTRAVENOUS

## 2018-01-26 MED ORDER — ACETAMINOPHEN 325 MG PO TABS
650.0000 mg | ORAL_TABLET | ORAL | Status: DC | PRN
  Administered 2018-01-27 – 2018-02-02 (×6): 650 mg via ORAL
  Filled 2018-01-26 (×7): qty 2

## 2018-01-26 MED ORDER — MENTHOL 3 MG MT LOZG: 1.0000 | LOZENGE | OROMUCOSAL | Status: DC | PRN

## 2018-01-26 MED ORDER — LIDOCAINE HCL (CARDIAC) PF 100 MG/5ML IV SOSY
PREFILLED_SYRINGE | INTRAVENOUS | Status: DC | PRN
  Administered 2018-01-26: 100 mg via INTRAVENOUS

## 2018-01-26 MED ORDER — VANCOMYCIN HCL 1000 MG IV SOLR
INTRAVENOUS | Status: AC
  Filled 2018-01-26: qty 1000

## 2018-01-26 MED ORDER — PHENYLEPHRINE 40 MCG/ML (10ML) SYRINGE FOR IV PUSH (FOR BLOOD PRESSURE SUPPORT)
PREFILLED_SYRINGE | INTRAVENOUS | Status: AC
  Filled 2018-01-26: qty 10

## 2018-01-26 MED ORDER — ONDANSETRON HCL 4 MG/2ML IJ SOLN
INTRAMUSCULAR | Status: AC
  Filled 2018-01-26: qty 2

## 2018-01-26 MED ORDER — DIAZEPAM 5 MG PO TABS
5.0000 mg | ORAL_TABLET | Freq: Four times a day (QID) | ORAL | Status: DC | PRN
  Administered 2018-01-28 – 2018-02-01 (×3): 5 mg via ORAL
  Administered 2018-02-02: 10 mg via ORAL
  Filled 2018-01-26: qty 1
  Filled 2018-01-26: qty 2
  Filled 2018-01-26 (×2): qty 1
  Filled 2018-01-26: qty 2

## 2018-01-26 SURGICAL SUPPLY — 73 items
ADH SKN CLS APL DERMABOND .7 (GAUZE/BANDAGES/DRESSINGS) ×1
APL SKNCLS STERI-STRIP NONHPOA (GAUZE/BANDAGES/DRESSINGS) ×1
BAG DECANTER FOR FLEXI CONT (MISCELLANEOUS) ×2 IMPLANT
BENZOIN TINCTURE PRP APPL 2/3 (GAUZE/BANDAGES/DRESSINGS) ×2 IMPLANT
BLADE CLIPPER SURG (BLADE) IMPLANT
BUR CUTTER 7.0 ROUND (BURR) IMPLANT
BUR MATCHSTICK NEURO 3.0 LAGG (BURR) ×2 IMPLANT
CANISTER SUCT 3000ML PPV (MISCELLANEOUS) ×2 IMPLANT
CAP LCK SPNE (Orthopedic Implant) ×18 IMPLANT
CAP LOCK SPINE RADIUS (Orthopedic Implant) ×18 IMPLANT
CAP LOCKING (Orthopedic Implant) ×36 IMPLANT
CARTRIDGE OIL MAESTRO DRILL (MISCELLANEOUS) ×1 IMPLANT
CONT SPEC 4OZ CLIKSEAL STRL BL (MISCELLANEOUS) ×2 IMPLANT
COVER BACK TABLE 60X90IN (DRAPES) ×2 IMPLANT
DECANTER SPIKE VIAL GLASS SM (MISCELLANEOUS) ×2 IMPLANT
DERMABOND ADVANCED (GAUZE/BANDAGES/DRESSINGS) ×1
DERMABOND ADVANCED .7 DNX12 (GAUZE/BANDAGES/DRESSINGS) ×1 IMPLANT
DEVICE INTERBODY ELEVATE 23X9 (Cage) ×8 IMPLANT
DIFFUSER DRILL AIR PNEUMATIC (MISCELLANEOUS) ×2 IMPLANT
DRAPE C-ARM 35X43 STRL (DRAPES) ×2 IMPLANT
DRAPE C-ARM 42X72 X-RAY (DRAPES) ×6 IMPLANT
DRAPE HALF SHEET 40X57 (DRAPES) IMPLANT
DRAPE LAPAROTOMY 100X72X124 (DRAPES) ×2 IMPLANT
DRAPE SURG 17X23 STRL (DRAPES) ×8 IMPLANT
DRSG OPSITE 4X5.5 SM (GAUZE/BANDAGES/DRESSINGS) ×2 IMPLANT
DRSG OPSITE POSTOP 4X10 (GAUZE/BANDAGES/DRESSINGS) ×2 IMPLANT
DRSG OPSITE POSTOP 4X6 (GAUZE/BANDAGES/DRESSINGS) ×2 IMPLANT
DURAPREP 26ML APPLICATOR (WOUND CARE) ×2 IMPLANT
ELECT REM PT RETURN 9FT ADLT (ELECTROSURGICAL) ×2
ELECTRODE REM PT RTRN 9FT ADLT (ELECTROSURGICAL) ×1 IMPLANT
EVACUATOR 1/8 PVC DRAIN (DRAIN) IMPLANT
GAUZE SPONGE 4X4 12PLY STRL (GAUZE/BANDAGES/DRESSINGS) ×2 IMPLANT
GAUZE SPONGE 4X4 16PLY XRAY LF (GAUZE/BANDAGES/DRESSINGS) ×6 IMPLANT
GLOVE BIO SURGEON STRL SZ7 (GLOVE) ×2 IMPLANT
GLOVE BIOGEL PI IND STRL 7.5 (GLOVE) ×1 IMPLANT
GLOVE BIOGEL PI INDICATOR 7.5 (GLOVE) ×1
GLOVE ECLIPSE 9.0 STRL (GLOVE) ×4 IMPLANT
GLOVE EXAM NITRILE LRG STRL (GLOVE) IMPLANT
GLOVE EXAM NITRILE XL STR (GLOVE) IMPLANT
GLOVE EXAM NITRILE XS STR PU (GLOVE) IMPLANT
GOWN STRL REUS W/ TWL LRG LVL3 (GOWN DISPOSABLE) IMPLANT
GOWN STRL REUS W/ TWL XL LVL3 (GOWN DISPOSABLE) ×2 IMPLANT
GOWN STRL REUS W/TWL 2XL LVL3 (GOWN DISPOSABLE) IMPLANT
GOWN STRL REUS W/TWL LRG LVL3 (GOWN DISPOSABLE)
GOWN STRL REUS W/TWL XL LVL3 (GOWN DISPOSABLE) ×4
GRAFT BN 10X1XDBM MAGNIFUSE (Bone Implant) ×3 IMPLANT
GRAFT BONE MAGNIFUSE 1X10CM (Bone Implant) ×6 IMPLANT
IV CATH AUTO 14GX1.75 SAFE ORG (IV SOLUTION) ×2 IMPLANT
KIT BASIN OR (CUSTOM PROCEDURE TRAY) ×2 IMPLANT
KIT INFUSE MEDIUM (Orthopedic Implant) ×2 IMPLANT
KIT TURNOVER KIT B (KITS) ×2 IMPLANT
MILL MEDIUM DISP (BLADE) ×2 IMPLANT
NEEDLE HYPO 22GX1.5 SAFETY (NEEDLE) ×2 IMPLANT
NS IRRIG 1000ML POUR BTL (IV SOLUTION) ×2 IMPLANT
OIL CARTRIDGE MAESTRO DRILL (MISCELLANEOUS) ×2
PACK LAMINECTOMY NEURO (CUSTOM PROCEDURE TRAY) ×2 IMPLANT
PATTIES SURGICAL 1X1 (DISPOSABLE) ×2 IMPLANT
ROD 260MM (Rod) ×4 IMPLANT
ROD SPINAL RAD 5.5X280 (Rod) ×2 IMPLANT
SCREW 4.75X40MM (Screw) ×12 IMPLANT
SCREW 5.75X40M (Screw) ×8 IMPLANT
SCREW 5.75X45MM (Screw) ×16 IMPLANT
SPONGE SURGIFOAM ABS GEL 100 (HEMOSTASIS) ×2 IMPLANT
STRIP CLOSURE SKIN 1/2X4 (GAUZE/BANDAGES/DRESSINGS) ×4 IMPLANT
SUT VIC AB 0 CT1 18XCR BRD8 (SUTURE) ×2 IMPLANT
SUT VIC AB 0 CT1 8-18 (SUTURE) ×4
SUT VIC AB 2-0 CT1 18 (SUTURE) ×2 IMPLANT
SUT VIC AB 3-0 FS2 27 (SUTURE) ×2 IMPLANT
SUT VIC AB 3-0 SH 8-18 (SUTURE) ×4 IMPLANT
TOWEL GREEN STERILE (TOWEL DISPOSABLE) ×2 IMPLANT
TOWEL GREEN STERILE FF (TOWEL DISPOSABLE) ×2 IMPLANT
TRAY FOLEY MTR SLVR 16FR STAT (SET/KITS/TRAYS/PACK) ×2 IMPLANT
WATER STERILE IRR 1000ML POUR (IV SOLUTION) ×2 IMPLANT

## 2018-01-26 NOTE — Progress Notes (Signed)
MD called back, no new orders received at this time. Will continue to monitor.

## 2018-01-26 NOTE — H&P (Signed)
Amy [name] is an 82 y.o. female.   Chief Complaint: Back and leg pain cancer just the edges need to come out and even HPI: 82 year old female with progressively worsening back pain and bilateral lower extremity symptoms consistent with neurogenic claudication which has failed conservative management.  Work-up demonstrates evidence of marked multilevel degenerative disease including significant degenerative scoliosis and degenerative spondylolisthesis worse at the L4-5 and L5-S1 levels.  (Patient with transitional anatomy so numbering system is based on the radiology report.)  Patient presents now for L4-5 and L5-S1 decompression and interbody fusion with posterior lateral fusion from T10-S1.   Past Medical History:  Diagnosis Date  . Anxiety   . Arthritis   . Back pain   . Colon polyps   . Depression   . Diabetes mellitus, type II ([place])    diet controlled  . Hypercholesterolemia   . Invasive ductal carcinoma of breast, female, right ([place])   . Osteopenia 11/26/2016  . Skin-picking disorder    has 3 open wounds on right wrist that are being treated. About a quarter in size.    Past Surgical History:  Procedure Laterality Date  . APPENDECTOMY    . BREAST BIOPSY Right 10/09/2016   Procedure: BREAST BIOPSY WITH NEEDLE LOCALIZATION;  Surgeon: [name], MD;  Location: [facility];  Service: General;  Laterality: Right;  . BREAST SURGERY Right   . BUNIONECTOMY Bilateral   . FACIAL COSMETIC SURGERY     drooping eyelid and brow  . HERNIA REPAIR Right    Inguinal x2  . KNEE ARTHROSCOPY Right   . SHOULDER SURGERY Right    rotator cuff repair and removal of bone spur  . TEMPOROMANDIBULAR JOINT SURGERY Right     Family History  Problem Relation Age of Onset  . Anxiety disorder Mother   . Depression Mother   . Depression Maternal Grandmother   . Alcohol abuse Brother   . Cancer - Other Sister   . ADD / ADHD Neg Hx   . Bipolar disorder Neg Hx   . Dementia Neg Hx   . Drug abuse Neg  Hx   . OCD Neg Hx   . Paranoid behavior Neg Hx   . Schizophrenia Neg Hx   . Seizures Neg Hx   . Sexual abuse Neg Hx   . Physical abuse Neg Hx    Social History:  reports that she quit smoking about 22 years ago. Her smoking use included cigarettes. She has a 20.00 pack-year smoking history. She has never used smokeless tobacco. She reports that she does not drink alcohol or use drugs.  Allergies:  Allergies  Allergen Reactions  . Cymbalta [Duloxetine Hcl] Other (See Comments)    Hallucinated and couldn't think; got worse and worse the longer she took this.    Medications Prior to Admission  Medication Sig Dispense Refill  . acetaminophen (TYLENOL) 325 MG tablet Take 325-650 mg by mouth every 6 (six) hours as needed (for pain. (in between percocet doses)).    Marland Kitchen ALPRAZolam (XANAX) 0.5 MG tablet TAKE 1 TABLET BY MOUTH THREE TIMES A DAY IF NEEDED FOR ANXIETY 90 tablet 2  . Calcium Carb-Cholecalciferol (OYSTER CALCIUM/D3) 500-200 MG-UNIT TABS Take 2 tablets by mouth 2 (two) times daily.     . cholecalciferol (VITAMIN D) 1000 units tablet Take 1 tablet (1,000 Units total) by mouth daily. 30 tablet 11  . docusate sodium (COLACE) 100 MG capsule Take 100 mg by mouth as needed for mild constipation.    Marland Kitchen  escitalopram (LEXAPRO) 20 MG tablet Take 1 tablet (20 mg total) by mouth daily. 30 tablet 2  . metFORMIN (GLUCOPHAGE) 500 MG tablet Take 500 mg by mouth 2 (two) times daily with a meal.     . oxyCODONE-acetaminophen (PERCOCET) 7.5-325 MG tablet Take 1 tablet by mouth every 4 (four) hours as needed for moderate pain or severe pain.     Marland Kitchen risperiDONE (RISPERDAL) 1 MG tablet Take 1 tablet (1 mg total) by mouth at bedtime. 30 tablet 2  . traZODone (DESYREL) 50 MG tablet Take 1 tablet (50 mg total) by mouth at bedtime. 30 tablet 2  . venlafaxine XR (EFFEXOR XR) 150 MG 24 hr capsule Take 1 capsule (150 mg total) by mouth 2 (two) times daily. 60 capsule 2  . denosumab (PROLIA) 60 MG/ML SOLN injection  Inject 60 mg into the skin every 6 (six) months. Administer in upper arm, thigh, or abdomen    . letrozole (FEMARA) 2.5 MG tablet Take 1 tablet (2.5 mg total) by mouth daily. 30 tablet 3  . ONE TOUCH ULTRA TEST test strip USE TO TEST BLOOD SUGAR ONCE DAILY  4  . valACYclovir (VALTREX) 500 MG tablet Take 1,000 mg by mouth See admin instructions. Take 2 tablets (1000 mg) by mouth daily for 3 days as needed for outbreaks.      Results for orders placed or performed during the hospital encounter of 01/26/18 (from the past 48 hour(s))  Glucose, capillary     Status: Abnormal   Collection Time: 01/26/18 12:50 PM  Result Value Ref Range   Glucose-Capillary 115 (H) 70 - 99 mg/dL   Comment 1 Notify RN    Comment 2 Document in Chart    No results found.  Pertinent items noted in HPI and remainder of comprehensive ROS otherwise negative.  Blood pressure (!) 183/86, pulse 85, temperature 98.3 F (36.8 C), temperature source Oral, resp. rate 20, SpO2 98 %.  Patient is awake and alert.  She is oriented and appropriate.  Speech is fluent.  Judgment and insight are intact.  Cranial nerve function normal bilateral.  Motor examination reveals some mild weakness of dorsiflexion bilaterally otherwise motor strength intact.  Sensory examination with decreased sensation pinprick and light touch in her L4 and L5 dermatomes bilaterally.  Deep tender mixes are hypoactive.  Gait is antalgic.  Posture is flexed.  Examination head ears eyes and throat is unremarkable her chest and abdomen are benign.  Extremities are free from injury deformity. Assessment/Plan Severe multilevel disc degeneration with degenerative spondylolisthesis and marked stenosis with neurogenic claudication at L4-5 and L5-S1.  Patient also with significant lumbar degenerative scoliosis.  Plan bilateral L4-5 and L5-S1 decompressive laminotomies and foraminotomies followed by posterior lumbar interbody fusion utilizing interbody peek cages, locally  harvested autograft, and augmented with posterior lateral arthrodesis.  Posterior lateral fusion this will extend from T10-S1 with segmental pedicle screw fixation.  Risks and benefits of been explained.  Patient wishes to proceed.  Cooper Render Esaul Dorwart 01/26/2018, 2:28 PM

## 2018-01-26 NOTE — Progress Notes (Signed)
Patient's blood pressure low at 85/52, MD paged to notify. Awaiting a call back.

## 2018-01-26 NOTE — Op Note (Signed)
Date of procedure: 01/26/2018  Date of dictation: Same  Service: Neurosurgery  Preoperative diagnosis: L4-5, L5-S1 degenerative spondylolisthesis with severe stenosis and neurogenic claudication.  Severe lumbar degenerative scoliosis  Postoperative diagnosis: Same  Procedure Name: Bilateral L4-L5 L5-S1 decompressive laminotomies with bilateral L4-L5 L5-S1 decompressive foraminotomies, more than would be required for simple interbody fusion alone.  L4-5, L5-S1 posterior lumbar interbody fusion utilizing interbody cages and locally harvested autograft  T10-S1 posterior lateral arthrodesis utilizing morselized allograft, morselized locally harvested autograft, and bone morphogenic protein soaked sponges and segmental pedicle screw fixation Jenkins  Surgeon:Kimi Kroft A.Dmani Mizer, M.D.  Asst. Surgeon: Arnoldo Morale  Anesthesia: General  Indication: 82 year old female with intractable back and bilateral lower extremity pain failing conservative management.  Work-up demonstrates evidence of severe multilevel disc degeneration with disc space collapse, lateral listhesis and marked degenerative lumbar scoliosis with marked coronal plane imbalance.  She also has sagittal plane and balanced evidence by unstable degenerative spondylolisthesis at L4-5 and L5-S1.  Patient has failed conservative management and presents now for decompression and fusion.  Operative note: After induction of anesthesia, patient position prone on the Wilson frame and appropriately padded.  Lumbar region prepped and draped sterilely.  Incision made overlying T10-S1.  Dissection performed bilaterally.  Retractor placed.  Fluoroscopy used.  Levels confirmed.  Decompressive laminotomies and facetectomies were then performed at L4-5 and L5-S1 to remove the inferior aspect of the lamina of L4 and L5 the entire inferior facet of L4 and L5 bilaterally the majority the superior facet of L5 and S1 bilaterally.  And the superior rim of the L5 and S1  lamina.  Ligament flavum elevated and resected.  Wide decompressive foraminotomies were performed on the course exiting L4, L5 and S1 nerve roots.  Bilateral discectomies then performed at L4-5 and L5-S1.  The space was then prepared for interbody fusion.  Disc spaces distracted.  With a distractor placed in patient's right side the space was cleaned of all soft tissue on the left side.  9 mm [employer] expandable cages packed with locally harvested autograft were then impacted into place and expanded to their full extent.  Distractor removed patient's right side.  The space prepared for interbody fusion on the right side.  Morselized autograft packed into the interspace.  A second cage was then impacted in place and expanded to its full extent.  Procedure was then repeated at L5-S1 and getting using 9 mm cages bilaterally.  Pedicles of T10-T11-T12 L1-L2-L3 L4-L5 and S1 were identified bilaterally using intraoperative fluoroscopy and superficial landmarks.  Superficial bone overlying the pedicle was then removed using high-speed drill E.  Each pedicle was then probed using pedicle all each pedicle tract was then tapped with a screw tapped.  Each screw table was probed and found to be solidly within bone.  Radius bran screws from [employer] were placed bilaterally from T10-S1.  Titanium rod was then cut and contoured to place over the screw heads.  Locking caps were placed over the screws and locking caps were then engaged with a construct under mild compression.  Final images reveal good position of the implants at the proper operative level with improved alignment of the spine.  Wound is then irrigated one final time.  The posterior elements from T10-S1 were decorticated using a high-speed drill.  Morselized autograft and magna views packages of allograft from [employer] were placed bilaterally.  Small bone morphogenic protein soaked sponges were placed bilaterally.  A drain was left in the epidural  space.  Gelfoam was  placed over the laminotomies.  Wound is then closed in layers of Vicryl sutures.  Steri-Strips and sterile dressing were applied.  Vancomycin was placed in the deep wound space prior to closure.  There were no apparent complications.  Patient tolerated the procedure well and she returns to the recovery room postop.

## 2018-01-26 NOTE — Anesthesia Preprocedure Evaluation (Signed)
Anesthesia Evaluation  Patient identified by MRN, date of birth, ID band Patient awake    Reviewed: Allergy & Precautions, NPO status , Patient's Chart, lab work & pertinent test results  History of Anesthesia Complications Negative for: history of anesthetic complications  Airway Mallampati: II  TM Distance: >3 FB Neck ROM: Full    Dental no notable dental hx. (+) Dental Advisory Given   Pulmonary former smoker,    Pulmonary exam normal        Cardiovascular negative cardio ROS Normal cardiovascular exam     Neuro/Psych PSYCHIATRIC DISORDERS Anxiety Depression negative neurological ROS     GI/Hepatic negative GI ROS, Neg liver ROS,   Endo/Other  diabetes  Renal/GU negative Renal ROS  negative genitourinary   Musculoskeletal negative musculoskeletal ROS (+)   Abdominal   Peds negative pediatric ROS (+)  Hematology negative hematology ROS (+)   Anesthesia Other Findings   Reproductive/Obstetrics negative OB ROS                             Anesthesia Physical Anesthesia Plan  ASA: III  Anesthesia Plan: General   Post-op Pain Management:    Induction: Intravenous  PONV Risk Score and Plan: 4 or greater and Ondansetron, Dexamethasone, Diphenhydramine and Treatment may vary due to age or medical condition  Airway Management Planned: Oral ETT  Additional Equipment:   Intra-op Plan:   Post-operative Plan: Extubation in OR  Informed Consent:   Plan Discussed with:   Anesthesia Plan Comments:         Anesthesia Quick Evaluation

## 2018-01-26 NOTE — Brief Op Note (Signed)
01/26/2018  9:08 PM  PATIENT:  Amy [name]  82 y.o. female  PRE-OPERATIVE DIAGNOSIS:  Spondylolisthesis  POST-OPERATIVE DIAGNOSIS:  Spondylolisthesis  PROCEDURE:  Procedure(s) with comments: Posterior Lumbar Interbody Fusion Lumbar Four-Lumbar Five - Lumbar Five-Sacral One;  Thoracic Ten-Sacral One post lateral fusion with pedicle screws (N/A) - Posterior Lumbar Interbody Fusion Lumbar Four-Lumbar Five - Lumbar Five-Sacral One;  Thoracic Ten-Sacral One post lateral fusion with pedicle screws  SURGEON:  Surgeon(s) and Role:    Earnie Larsson, MD - Primary  PHYSICIAN ASSISTANT:   ASSISTANTS: Jenkins   ANESTHESIA:   general  EBL:  1500 mL   BLOOD ADMINISTERED:2 units CC PRBC  DRAINS: (med) Hemovact drain(s) in the Epidural space with  Suction Open   LOCAL MEDICATIONS USED:  NONE  SPECIMEN:  No Specimen  DISPOSITION OF SPECIMEN:  N/A  COUNTS:  YES  TOURNIQUET:  * No tourniquets in log *  DICTATION: .Dragon Dictation  PLAN OF CARE: Admit to inpatient   PATIENT DISPOSITION:  PACU - hemodynamically stable.   Delay start of Pharmacological VTE agent (>24hrs) due to surgical blood loss or risk of bleeding: yes

## 2018-01-26 NOTE — Anesthesia Procedure Notes (Signed)
Procedure Name: Intubation Date/Time: 01/26/2018 2:56 PM Performed by: Inda Coke, CRNA Pre-anesthesia Checklist: Patient identified, Emergency Drugs available, Suction available and Patient being monitored Patient Re-evaluated:Patient Re-evaluated prior to induction Oxygen Delivery Method: Circle System Utilized Preoxygenation: Pre-oxygenation with 100% oxygen Induction Type: IV induction Ventilation: Mask ventilation without difficulty Laryngoscope Size: Mac and 3 Grade View: Grade III Tube type: Oral Tube size: 7.0 mm Number of attempts: 1 Airway Equipment and Method: Stylet and Oral airway Placement Confirmation: ETT inserted through vocal cords under direct vision,  positive ETCO2 and breath sounds checked- equal and bilateral Secured at: 21 cm Tube secured with: Tape Dental Injury: Teeth and Oropharynx as per pre-operative assessment  Difficulty Due To: Difficult Airway- due to anterior larynx

## 2018-01-26 NOTE — Progress Notes (Signed)
Patient admitted from PACU. Patient alert and oriented x 3. Patient very uncomfortable, complaining of pain 8 out of pain. Pain given per order. Will continue to monitor.

## 2018-01-26 NOTE — Transfer of Care (Signed)
Immediate Anesthesia Transfer of Care Note  Patient: Amy [name]  Procedure(s) Performed: Posterior Lumbar Interbody Fusion Lumbar Four-Lumbar Five - Lumbar Five-Sacral One;  Thoracic Ten-Sacral One post lateral fusion with pedicle screws (N/A Back)  Patient Location: PACU  Anesthesia Type:General  Level of Consciousness: awake, sedated and patient cooperative  Airway & Oxygen Therapy: Patient connected to nasal cannula oxygen  Post-op Assessment: Report given to RN and Post -op Vital signs reviewed and stable  Post vital signs: Reviewed and stable  Last Vitals:  Vitals Value Taken Time  BP 131/94 01/26/2018  9:23 PM  Temp    Pulse 100 01/26/2018  9:28 PM  Resp 14 01/26/2018  9:28 PM  SpO2 100 % 01/26/2018  9:28 PM  Vitals shown include unvalidated device data.  Last Pain:  Vitals:   01/26/18 1322  TempSrc:   PainSc: 0-No pain         Complications: No apparent anesthesia complications

## 2018-01-27 ENCOUNTER — Other Ambulatory Visit: Payer: Self-pay

## 2018-01-27 LAB — CBC
HCT: 33.9 % — ABNORMAL LOW (ref 36.0–46.0)
Hemoglobin: 11 g/dL — ABNORMAL LOW (ref 12.0–15.0)
MCH: 29.7 pg (ref 26.0–34.0)
MCHC: 32.4 g/dL (ref 30.0–36.0)
MCV: 91.6 fL (ref 78.0–100.0)
Platelets: 133 10*3/uL — ABNORMAL LOW (ref 150–400)
RBC: 3.7 MIL/uL — ABNORMAL LOW (ref 3.87–5.11)
RDW: 13.9 % (ref 11.5–15.5)
WBC: 16.8 10*3/uL — ABNORMAL HIGH (ref 4.0–10.5)

## 2018-01-27 LAB — BPAM RBC
Blood Product Expiration Date: 201908042359
Blood Product Expiration Date: 201908042359
ISSUE DATE / TIME: 201907011844
ISSUE DATE / TIME: 201907011844
Unit Type and Rh: 5100
Unit Type and Rh: 5100

## 2018-01-27 LAB — BASIC METABOLIC PANEL
Anion gap: 7 (ref 5–15)
BUN: 20 mg/dL (ref 8–23)
CO2: 22 mmol/L (ref 22–32)
Calcium: 6.9 mg/dL — ABNORMAL LOW (ref 8.9–10.3)
Chloride: 108 mmol/L (ref 98–111)
Creatinine, Ser: 1.02 mg/dL — ABNORMAL HIGH (ref 0.44–1.00)
GFR calc Af Amer: 59 mL/min — ABNORMAL LOW (ref >60)
GFR calc non Af Amer: 51 mL/min — ABNORMAL LOW (ref >60)
Glucose, Bld: 284 mg/dL — ABNORMAL HIGH (ref 70–99)
Potassium: 4.5 mmol/L (ref 3.5–5.1)
Sodium: 137 mmol/L (ref 135–145)

## 2018-01-27 LAB — GLUCOSE, CAPILLARY
Glucose-Capillary: 272 mg/dL — ABNORMAL HIGH (ref 70–99)
Glucose-Capillary: 264 mg/dL — ABNORMAL HIGH (ref 70–99)
Glucose-Capillary: 202 mg/dL — ABNORMAL HIGH (ref 70–99)
Glucose-Capillary: 200 mg/dL — ABNORMAL HIGH (ref 70–99)

## 2018-01-27 LAB — TYPE AND SCREEN
ABO/RH(D): O POS
Antibody Screen: NEGATIVE
Unit division: 0
Unit division: 0

## 2018-01-27 MED FILL — Heparin Sodium (Porcine) Inj 1000 Unit/ML: INTRAMUSCULAR | Qty: 30 | Status: AC

## 2018-01-27 MED FILL — Sodium Chloride IV Soln 0.9%: INTRAVENOUS | Qty: 2000 | Status: AC

## 2018-01-27 NOTE — Evaluation (Addendum)
Physical Therapy Evaluation Patient Details Name: Amy [name] MRN: [id number] DOB: August 28, 1935 Today's Date: 01/27/2018   History of Present Illness   pt is an 82 y/o female with PMH significant for DM2, rotator cuff repair, admitted with progressively worsening back and leg pain, s/p L45, L5-S1 decompressive laminotomies and foraminotomies, L45, L5-S1 PLIF with autograft and T10-S1 PLA with alografting and fixation.  Clinical Impression  Pt admitted with/for elective spinal fusion surgery..  Pt currently limited functionally due to the problems listed. ( See problems list.)   Pt will benefit from PT to maximize function and safety in order to get ready for next venue listed below.  Goals and education to be documented after brace arrives and pt has mobilized.     Follow Up Recommendations Other (comment)(TBA after mobility)    Equipment Recommendations  None recommended by PT    Recommendations for Other Services       Precautions / Restrictions Precautions Precautions: Back Required Braces or Orthoses: Spinal Brace Spinal Brace: Lumbar corset;Applied in sitting position      Mobility  Bed Mobility                  Transfers                    Ambulation/Gait                Stairs            Wheelchair Mobility    Modified Rankin (Stroke Patients Only)       Balance                                             Pertinent Vitals/Pain Pain Assessment: Faces Faces Pain Scale: Hurts even more Pain Location: back Pain Descriptors / Indicators: Aching;Grimacing;Guarding Pain Intervention(s): Monitored during session;Premedicated before session    Home Living Family/patient expects to be discharged to:: Private residence Living Arrangements: Alone Available Help at Discharge: Family;Friend(s);Available PRN/intermittently(pt reports help from next door neighbor and nephew next door) Type of Home: House Home Access:  Stairs to enter Entrance Stairs-Rails: Psychiatric nurse of Steps: several Home Layout: Two level;Able to live on main level with bedroom/bathroom Home Equipment: Walker - 4 wheels;Cane - single point      Prior Function Level of Independence: Independent with assistive device(s)         Comments: pt's comments are often vague and I don't get a sense of how much assist she needs for IADL's     Hand Dominance        Extremity/Trunk Assessment   Upper Extremity Assessment Upper Extremity Assessment: Defer to OT evaluation    Lower Extremity Assessment Lower Extremity Assessment: (moved both legs into full hip/knee flexion)       Communication   Communication: No difficulties  Cognition Arousal/Alertness: Awake/alert Behavior During Therapy: WFL for tasks assessed/performed;Flat affect Overall Cognitive Status: No family/caregiver present to determine baseline cognitive functioning                                 General Comments: pt not a great historian      General Comments General comments (skin integrity, edema, etc.): Initiated back care/precaution and typical progression of activity.  Pt's family took her brace home and not available  at this time to get pt OOB.    Exercises     Assessment/Plan    PT Assessment Patient needs continued PT services  PT Problem List Decreased activity tolerance;Decreased mobility;Decreased knowledge of use of DME;Decreased knowledge of precautions;Pain       PT Treatment Interventions DME instruction;Gait training;Stair training;Functional mobility training;Therapeutic activities;Patient/family education    PT Goals (Current goals can be found in the Care Plan section)  Acute Rehab PT Goals Patient Stated Goal: pt did not state PT Goal Formulation: With patient Time For Goal Achievement: 02/10/18 Potential to Achieve Goals: Good    Frequency Min 5X/week   Barriers to discharge         Co-evaluation               AM-PAC PT "6 Clicks" Daily Activity  Outcome Measure Difficulty turning over in bed (including adjusting bedclothes, sheets and blankets)?: Unable Difficulty moving from lying on back to sitting on the side of the bed? : Unable Difficulty sitting down on and standing up from a chair with arms (e.g., wheelchair, bedside commode, etc,.)?: Unable Help needed moving to and from a bed to chair (including a wheelchair)?: A Little Help needed walking in hospital room?: A Little Help needed climbing 3-5 steps with a railing? : A Little 6 Click Score: 12    End of Session     Patient left: in bed Nurse Communication: Mobility status PT Visit Diagnosis: Pain;Other abnormalities of gait and mobility (R26.89) Pain - part of body: (back)    Time: 1030-1048 PT Time Calculation (min) (ACUTE ONLY): 18 min   Charges:   PT Evaluation $PT Eval Low Complexity: 1 Low     PT G Codes:        02/24/18  Donnella Sham, PT [phone]  (pager)  Tessie Fass Acxel Dingee Feb 24, 2018, 12:27 PM

## 2018-01-27 NOTE — Progress Notes (Signed)
Postop day 1.  Patient's pain currently well controlled.  No lower extremity pain.  No numbness, no paresthesias, no weakness.No abdominal pain.  She is afebrile.  Heart rate is slightly elevated.  Respiratory rate stable.  Blood pressure acceptable.  Urine output acceptable.  Drain output moderately high.  Hematocrit this morning 33.9.  Electrolytes good.  Creatinine stable.  Motor and sensory exam intact.  Abdomen soft.  Wound clean and dry.  Overall doing well following extensive thoracic lumbar fusion surgery.  Begin mobilization today.  Check follow-up labs tomorrow.

## 2018-01-27 NOTE — Anesthesia Postprocedure Evaluation (Signed)
Anesthesia Post Note  Patient: KIEARRA OYERVIDES  Procedure(s) Performed: Posterior Lumbar Interbody Fusion Lumbar Four-Lumbar Five - Lumbar Five-Sacral One;  Thoracic Ten-Sacral One post lateral fusion with pedicle screws (N/A Back)     Patient location during evaluation: PACU Anesthesia Type: General Level of consciousness: awake and patient cooperative Pain management: pain level controlled Vital Signs Assessment: post-procedure vital signs reviewed and stable Respiratory status: spontaneous breathing, nonlabored ventilation, respiratory function stable and patient connected to nasal cannula oxygen Cardiovascular status: blood pressure returned to baseline and stable Postop Assessment: no apparent nausea or vomiting Anesthetic complications: no    Last Vitals:  Vitals:   01/27/18 0328 01/27/18 0400  BP:  99/64  Pulse:  (!) 111  Resp:  11  Temp: 36.7 C   SpO2:  98%    Last Pain:  Vitals:   01/27/18 0400  TempSrc:   PainSc: 6                  Jamai Dolce

## 2018-01-27 NOTE — Progress Notes (Signed)
Clinical Impression: PTA, pt was living alone and independent with intermittent cane use for ADL and functional mobility. She currently is limited by back pain and decreased standing tolerance for ADL participation. She requires min assist overall for dressing and bathing tasks as well as bed mobility. Pt and daughter educated concerning back precautions, brace wear schedule, and LB ADL compensatory strategies. Feel that pt would benefit from short-term SNF placement prior to returning home in order to maximize return to independence as she lives alone.    01/27/18 1400  OT Visit Information  Last OT Received On 01/27/18  Assistance Needed +1  History of Present Illness  Pt is an 82 y/o female with PMH significant for DM2, rotator cuff repair, admitted with progressively worsening back and leg pain, s/p L45, L5-S1 decompressive laminotomies and foraminotomies, L45, L5-S1 PLIF with autograft and T10-S1 PLA with alografting and fixation.  Precautions  Precautions Back  Required Braces or Orthoses Spinal Brace  Spinal Brace Lumbar corset;Applied in sitting position  Restrictions  Weight Bearing Restrictions No  Home Living  Family/patient expects to be discharged to: Private residence  Living Arrangements Alone  Available Help at Discharge  (pt reports help from next door neighbor and nephew next door)  Type of [place] to enter  Entrance Stairs-Number of Steps several  Entrance Stairs-Rails [place] Two level;Able to live on main level with bedroom/bathroom  Alternate Level Stairs-Number of Steps flight  Alternate Level Stairs-Rails Left;Right  Bathroom Shower/Tub Tub/shower unit  Tax adviser - 4 wheels;Cane - single point  Prior Function  Level of Independence Independent with assistive device(s)  Comments Reports using cane for outdoor use only this session. Daughter nearby (limited physical assist) and neighbor  as well as nephew are able to assist.   Communication  Communication No difficulties  Pain Assessment  Pain Assessment Faces  Faces Pain Scale 6  Pain Location back  Pain Descriptors / Indicators Aching;Grimacing;Guarding  Pain Intervention(s) Monitored during session;Premedicated before session  Cognition  Arousal/Alertness Awake/alert  Behavior During Therapy [facility] for tasks assessed/performed;Flat affect  Overall Cognitive Status Impaired/Different from baseline  Area of Impairment Safety/judgement;Memory  Memory Decreased recall of precautions;Decreased short-term memory  Safety/Judgement Decreased awareness of safety;Decreased awareness of deficits  General Comments Pt able to follow commands but with noted decreased memory during session as well as decreased safety awareness.   Upper Extremity Assessment  Upper Extremity Assessment Generalized weakness  Lower Extremity Assessment  Lower Extremity Assessment Defer to PT evaluation  ADL  Overall ADL's  Needs assistance/impaired  Eating/Feeding Set up;Sitting  Grooming Minimal assistance;Sitting  Upper Body Bathing Minimal assistance;Standing  Lower Body Bathing Minimal assistance;Sit to/from stand  Upper Body Dressing  Minimal assistance;Sitting  Lower Body Dressing Minimal assistance;Sit to/from Retail buyer Minimal assistance;Ambulation;RW  Toilet Transfer Details (indicate cue type and reason) Assist due to posterior bias and L hip weakness.   Toileting- Clothing Manipulation and Hygiene Minimal assistance;Sit to/from stand  Functional mobility during ADLs Minimal assistance;Rolling walker  General ADL Comments Pt able to complete figure 4 method for LB ADL tasks.   Vision- History  Patient Visual Report No change from baseline  Vision- Assessment  Vision Assessment? No apparent visual deficits  Bed Mobility  Overal bed mobility Needs Assistance  Bed Mobility Rolling;Sidelying to Sit  Rolling Min assist   Sidelying to sit Min assist;+2 for safety/equipment  General bed mobility comments Assist for trunk and technique.  Transfers  Overall transfer level Needs assistance  Equipment used Rolling walker (2 wheeled)  Transfers Sit to/from Stand  Sit to Stand Min assist;+2 safety/equipment  General transfer comment Assist to power up to standing.   Balance  Overall balance assessment Needs assistance  Sitting-balance support Single extremity supported;No upper extremity supported  Sitting balance-Leahy Scale Fair  Sitting balance - Comments supervision to min guard assist for safety  Standing balance support Bilateral upper extremity supported;During functional activity  Standing balance-Leahy Scale Poor  Standing balance comment relies on B UE support from RW  General Comments  General comments (skin integrity, edema, etc.) Educated pt and daughter concerning back precautions and compensatory strategies in detail.   OT - End of Session  Equipment Utilized During Treatment Rolling walker;Back brace  Activity Tolerance Patient tolerated treatment well  Patient left in chair;with call bell/phone within reach;with family/visitor present  Nurse Communication Mobility status  OT Assessment  OT Recommendation/Assessment Patient needs continued OT Services  OT Visit Diagnosis Unsteadiness on feet (R26.81);Muscle weakness (generalized) (M62.81);Pain  Pain - part of body  (back)  OT Problem List Decreased strength;Decreased range of motion;Decreased activity tolerance;Impaired balance (sitting and/or standing);Decreased knowledge of use of DME or AE;Decreased safety awareness;Decreased knowledge of precautions;Pain  OT Plan  OT Frequency (ACUTE ONLY) Min 2X/week  OT Treatment/Interventions (ACUTE ONLY) Self-care/ADL training;Therapeutic exercise;Energy conservation;DME and/or AE instruction;Therapeutic activities;Patient/family education;Balance training;Cognitive remediation/compensation  AM-PAC  OT "6 Clicks" Daily Activity Outcome Measure  Help from another person eating meals? 3  Help from another person taking care of personal grooming? 3  Help from another person toileting, which includes using toliet, bedpan, or urinal? 3  Help from another person bathing (including washing, rinsing, drying)? 3  Help from another person to put on and taking off regular upper body clothing? 3  Help from another person to put on and taking off regular lower body clothing? 3  6 Click Score 18  ADL G Code Conversion CK  OT Recommendation  Follow Up Recommendations SNF;Supervision/Assistance - 24 hour  OT Equipment Other (comment) (defer to next venue of care)  Individuals Consulted  Consulted and Agree with Results and Recommendations Patient;Family member/caregiver  Family Member Consulted daughter  Acute Rehab OT Goals  Patient Stated Goal pt did not state  OT Goal Formulation With patient/family  Time For Goal Achievement 02/10/18  Potential to Achieve Goals Good  OT Time Calculation  OT Start Time (ACUTE ONLY) 1338  OT Stop Time (ACUTE ONLY) 1410  OT Time Calculation (min) 32 min  OT General Charges  $OT Visit 1 Visit  OT Evaluation  $OT Eval Moderate Complexity 1 Mod   Norman Herrlich, MS OTR/L  Pager: [phone]

## 2018-01-27 NOTE — Progress Notes (Signed)
Physical Therapy Treatment Patient Details Name: Amy [name] MRN: [id number] DOB: 04-08-1936 Today's Date: 01/27/2018    History of Present Illness  Pt is an 82 y/o female with PMH significant for DM2, rotator cuff repair, admitted with progressively worsening back and leg pain, s/p L45, L5-S1 decompressive laminotomies and foraminotomies, L45, L5-S1 PLIF with autograft and T10-S1 PLA with alografting and fixation.    PT Comments    Pt mobilized a short distance with mild difficulty due to R hip drop.  All education initiated with pt and dtr.   Follow Up Recommendations  Other (comment)     Equipment Recommendations  None recommended by PT    Recommendations for Other Services Rehab consult     Precautions / Restrictions Precautions Precautions: Back Required Braces or Orthoses: Spinal Brace Spinal Brace: Lumbar corset;Applied in sitting position Restrictions Weight Bearing Restrictions: No    Mobility  Bed Mobility Overal bed mobility: Needs Assistance Bed Mobility: Rolling;Sidelying to Sit Rolling: Min assist Sidelying to sit: Min assist;+2 for safety/equipment       General bed mobility comments: cues for technique and truncal assist while pt assisted to come up.  Transfers Overall transfer level: Needs assistance Equipment used: Rolling walker (2 wheeled) Transfers: Sit to/from Stand Sit to Stand: Min assist;+2 physical assistance         General transfer comment: cues for hand placement and assist to come forward with minor need for boost.  Ambulation/Gait Ambulation/Gait assistance: Min assist;+2 safety/equipment Gait Distance (Feet): 15 Feet(around the bed to the chair) Assistive device: Rolling walker (2 wheeled) Gait Pattern/deviations: Step-through pattern Gait velocity: slow Gait velocity interpretation: <1.31 ft/sec, indicative of household ambulator General Gait Details: mildly unsteady with R side hip drop with swing through   Stairs              Wheelchair Mobility    Modified Rankin (Stroke Patients Only)       Balance Overall balance assessment: Needs assistance Sitting-balance support: Single extremity supported;No upper extremity supported Sitting balance-Leahy Scale: Fair Sitting balance - Comments: able to assist with bil UE in donning the brace   Standing balance support: Bilateral upper extremity supported;During functional activity Standing balance-Leahy Scale: Poor Standing balance comment: reliant on the RW                            Cognition Arousal/Alertness: Awake/alert Behavior During Therapy: WFL for tasks assessed/performed;Flat affect Overall Cognitive Status: Impaired/Different from baseline Area of Impairment: Safety/judgement;Memory                     Memory: Decreased recall of precautions;Decreased short-term memory   Safety/Judgement: Decreased awareness of safety;Decreased awareness of deficits     General Comments: Pt able to follow commands but with noted decreased memory during session as well as decreased safety awareness.       Exercises      General Comments General comments (skin integrity, edema, etc.): Instructed pt/dtr in back care/prec., log roll/transitions to/from sidelying, lifting restrictions, donning the brace, progression of activity,      Pertinent Vitals/Pain Pain Assessment: Faces Faces Pain Scale: Hurts even more Pain Location: back Pain Descriptors / Indicators: Aching;Grimacing;Guarding Pain Intervention(s): Monitored during session    Home Living Family/patient expects to be discharged to:: Private residence Living Arrangements: Alone Available Help at Discharge: (pt reports help from next door neighbor and nephew next door) Type of Home: House Home Access: Stairs to  enter Entrance Stairs-Rails: Right;Left Home Layout: Two level;Able to live on main level with bedroom/bathroom Home Equipment: Walker - 4 wheels;Cane  - single point      Prior Function Level of Independence: Independent with assistive device(s)      Comments: Reports using cane for outdoor use only this session. Daughter nearby (limited physical assist) and neighbor as well as nephew are able to assist.    PT Goals (current goals can now be found in the care plan section) Acute Rehab PT Goals Patient Stated Goal: pt did not state PT Goal Formulation: With patient Time For Goal Achievement: 02/10/18 Potential to Achieve Goals: Good Progress towards PT goals: Progressing toward goals    Frequency    Min 5X/week      PT Plan Current plan remains appropriate    Co-evaluation              AM-PAC PT "6 Clicks" Daily Activity  Outcome Measure  Difficulty turning over in bed (including adjusting bedclothes, sheets and blankets)?: Unable Difficulty moving from lying on back to sitting on the side of the bed? : Unable Difficulty sitting down on and standing up from a chair with arms (e.g., wheelchair, bedside commode, etc,.)?: Unable Help needed moving to and from a bed to chair (including a wheelchair)?: A Little Help needed walking in hospital room?: A Little Help needed climbing 3-5 steps with a railing? : A Little 6 Click Score: 12    End of Session   Activity Tolerance: Patient tolerated treatment well Patient left: in bed Nurse Communication: Mobility status PT Visit Diagnosis: Other (comment);Other abnormalities of gait and mobility (R26.89) Pain - part of body: (back)     Time: 1327-1410 PT Time Calculation (min) (ACUTE ONLY): 43 min  Charges:  $Therapeutic Activity: 8-22 mins $Self Care/Home Management: 8-22                    G Codes:       2018/02/13  [place], [place] [phone]  (pager)02/13/2018  Donnella Sham, PT [phone]  (pager)   Tessie Fass Zaara Sprowl 02-13-18, 4:56 PM

## 2018-01-28 DIAGNOSIS — G8918 Other acute postprocedural pain: Secondary | ICD-10-CM

## 2018-01-28 DIAGNOSIS — M48062 Spinal stenosis, lumbar region with neurogenic claudication: Secondary | ICD-10-CM

## 2018-01-28 DIAGNOSIS — M4310 Spondylolisthesis, site unspecified: Secondary | ICD-10-CM

## 2018-01-28 LAB — CBC
HCT: 25.5 % — ABNORMAL LOW (ref 36.0–46.0)
Hemoglobin: 8.3 g/dL — ABNORMAL LOW (ref 12.0–15.0)
MCH: 29.4 pg (ref 26.0–34.0)
MCHC: 32.5 g/dL (ref 30.0–36.0)
MCV: 90.4 fL (ref 78.0–100.0)
Platelets: 110 10*3/uL — ABNORMAL LOW (ref 150–400)
RBC: 2.82 MIL/uL — ABNORMAL LOW (ref 3.87–5.11)
RDW: 13.8 % (ref 11.5–15.5)
WBC: 9.5 10*3/uL (ref 4.0–10.5)

## 2018-01-28 LAB — BASIC METABOLIC PANEL
Anion gap: 4 — ABNORMAL LOW (ref 5–15)
BUN: 18 mg/dL (ref 8–23)
CO2: 27 mmol/L (ref 22–32)
Calcium: 8.3 mg/dL — ABNORMAL LOW (ref 8.9–10.3)
Chloride: 105 mmol/L (ref 98–111)
Creatinine, Ser: 0.93 mg/dL (ref 0.44–1.00)
GFR calc Af Amer: >60 mL/min (ref >60)
GFR calc non Af Amer: 57 mL/min — ABNORMAL LOW (ref >60)
Glucose, Bld: 180 mg/dL — ABNORMAL HIGH (ref 70–99)
Potassium: 4.2 mmol/L (ref 3.5–5.1)
Sodium: 136 mmol/L (ref 135–145)

## 2018-01-28 LAB — GLUCOSE, CAPILLARY
Glucose-Capillary: 177 mg/dL — ABNORMAL HIGH (ref 70–99)
Glucose-Capillary: 145 mg/dL — ABNORMAL HIGH (ref 70–99)
Glucose-Capillary: 136 mg/dL — ABNORMAL HIGH (ref 70–99)
Glucose-Capillary: 101 mg/dL — ABNORMAL HIGH (ref 70–99)

## 2018-01-28 NOTE — Consult Note (Signed)
Physical Medicine and Rehabilitation Consult Reason for Consult: Decreased functional mobility Referring Physician: Dr. Annette Stable   HPI: Amy [name] is a 82 y.o. right-handed female with history of hyperlipidemia, diabetes mellitus, invasive ductal carcinoma right breast cancer.  Per chart review patient lives alone was independent with assistive device as needed prior to admission.  Two-level home with bedroom and bathroom on main.  She does have some assistance from her next door neighbor as well as a nephew.  Presented 01/26/2018 with increasing low back pain radiating to bilateral lower extremities.  Denies any bowel or bladder disturbances.  Work-up demonstrated evidence of marked multilevel degenerative disease including significant degenerative scoliosis and degenerative spondylolisthesis with radiculopathy.  Underwent multilevel bilateral L4-5, L5-S1 with decompressive laminotomies/PLIF as well as T10-S1 posterior lateral arthrodesis 01/26/2018 per Dr. Annette Stable.  Hospital course pain management.  Back brace when out of bed applied in sitting position.  Acute blood loss anemia 8.3 and monitored.  Physical therapy evaluation completed.  MD has requested physical medicine rehab consult.  Received hydrocodone 10 mg 1 tablet approximately 1 hour prior to evaluation Patient complains of back pain with movement but comfortable at rest, falling asleep   Review of Systems  Constitutional: Negative for chills and fever.  HENT: Negative for hearing loss.   Eyes: Negative for blurred vision and double vision.  Respiratory: Negative for cough and shortness of breath.   Cardiovascular: Positive for leg swelling. Negative for chest pain and palpitations.  Gastrointestinal: Positive for constipation. Negative for nausea and vomiting.  Genitourinary: Negative for dysuria and flank pain.  Musculoskeletal: Positive for back pain.  Skin: Negative for rash.  Psychiatric/Behavioral:       Anxiety  All  other systems reviewed and are negative.  Past Medical History:  Diagnosis Date  . Anxiety   . Arthritis   . Back pain   . Colon polyps   . Depression   . Diabetes mellitus, type II ([place])    diet controlled  . Hypercholesterolemia   . Invasive ductal carcinoma of breast, female, right ([place])   . Osteopenia 11/26/2016  . Skin-picking disorder    has 3 open wounds on right wrist that are being treated. About a quarter in size.   Past Surgical History:  Procedure Laterality Date  . APPENDECTOMY    . BREAST BIOPSY Right 10/09/2016   Procedure: BREAST BIOPSY WITH NEEDLE LOCALIZATION;  Surgeon: [name], MD;  Location: [facility];  Service: General;  Laterality: Right;  . BREAST SURGERY Right   . BUNIONECTOMY Bilateral   . FACIAL COSMETIC SURGERY     drooping eyelid and brow  . HERNIA REPAIR Right    Inguinal x2  . KNEE ARTHROSCOPY Right   . SHOULDER SURGERY Right    rotator cuff repair and removal of bone spur  . TEMPOROMANDIBULAR JOINT SURGERY Right    Family History  Problem Relation Age of Onset  . Anxiety disorder Mother   . Depression Mother   . Depression Maternal Grandmother   . Alcohol abuse Brother   . Cancer - Other Sister   . ADD / ADHD Neg Hx   . Bipolar disorder Neg Hx   . Dementia Neg Hx   . Drug abuse Neg Hx   . OCD Neg Hx   . Paranoid behavior Neg Hx   . Schizophrenia Neg Hx   . Seizures Neg Hx   . Sexual abuse Neg Hx   . Physical abuse Neg Hx  Social History:  reports that she quit smoking about 22 years ago. Her smoking use included cigarettes. She has a 20.00 pack-year smoking history. She has never used smokeless tobacco. She reports that she does not drink alcohol or use drugs. Allergies:  Allergies  Allergen Reactions  . Cymbalta [Duloxetine Hcl] Other (See Comments)    Hallucinated and couldn't think; got worse and worse the longer she took this.   Medications Prior to Admission  Medication Sig Dispense Refill  . acetaminophen (TYLENOL) 325  MG tablet Take 325-650 mg by mouth every 6 (six) hours as needed (for pain. (in between percocet doses)).    Marland Kitchen ALPRAZolam (XANAX) 0.5 MG tablet TAKE 1 TABLET BY MOUTH THREE TIMES A DAY IF NEEDED FOR ANXIETY 90 tablet 2  . Calcium Carb-Cholecalciferol (OYSTER CALCIUM/D3) 500-200 MG-UNIT TABS Take 2 tablets by mouth 2 (two) times daily.     . cholecalciferol (VITAMIN D) 1000 units tablet Take 1 tablet (1,000 Units total) by mouth daily. 30 tablet 11  . docusate sodium (COLACE) 100 MG capsule Take 100 mg by mouth as needed for mild constipation.    Marland Kitchen escitalopram (LEXAPRO) 20 MG tablet Take 1 tablet (20 mg total) by mouth daily. 30 tablet 2  . metFORMIN (GLUCOPHAGE) 500 MG tablet Take 500 mg by mouth 2 (two) times daily with a meal.     . oxyCODONE-acetaminophen (PERCOCET) 7.5-325 MG tablet Take 1 tablet by mouth every 4 (four) hours as needed for moderate pain or severe pain.     Marland Kitchen risperiDONE (RISPERDAL) 1 MG tablet Take 1 tablet (1 mg total) by mouth at bedtime. 30 tablet 2  . traZODone (DESYREL) 50 MG tablet Take 1 tablet (50 mg total) by mouth at bedtime. 30 tablet 2  . venlafaxine XR (EFFEXOR XR) 150 MG 24 hr capsule Take 1 capsule (150 mg total) by mouth 2 (two) times daily. 60 capsule 2  . denosumab (PROLIA) 60 MG/ML SOLN injection Inject 60 mg into the skin every 6 (six) months. Administer in upper arm, thigh, or abdomen    . letrozole (FEMARA) 2.5 MG tablet Take 1 tablet (2.5 mg total) by mouth daily. 30 tablet 3  . ONE TOUCH ULTRA TEST test strip USE TO TEST BLOOD SUGAR ONCE DAILY  4  . valACYclovir (VALTREX) 500 MG tablet Take 1,000 mg by mouth See admin instructions. Take 2 tablets (1000 mg) by mouth daily for 3 days as needed for outbreaks.      Home: Home Living Family/patient expects to be discharged to:: Private residence Living Arrangements: Alone Available Help at Discharge: (pt reports help from next door neighbor and nephew next door) Type of Home: House Home Access: Stairs  to enter [employer] of Steps: several Entrance Stairs-Rails: Right, Left Home Layout: Two level, Able to live on main level with bedroom/bathroom Alternate Level Stairs-Number of Steps: flight Alternate Level Stairs-Rails: Left, Right Bathroom Shower/Tub: Chiropodist: Standard Home Equipment: Environmental consultant - 4 wheels, Cane - single point  Functional History: Prior Function Level of Independence: Independent with assistive device(s) Comments: Reports using cane for outdoor use only this session. Daughter nearby (limited physical assist) and neighbor as well as nephew are able to assist.  Functional Status:  Mobility: Bed Mobility Overal bed mobility: Needs Assistance Bed Mobility: Rolling, Sidelying to Sit Rolling: Min assist Sidelying to sit: Min assist, +2 for safety/equipment General bed mobility comments: cues for technique and truncal assist while pt assisted to come up. Transfers Overall transfer level: Needs  assistance Equipment used: Rolling walker (2 wheeled) Transfers: Sit to/from Stand Sit to Stand: Min assist, +2 physical assistance General transfer comment: cues for hand placement and assist to come forward with minor need for boost. Ambulation/Gait Ambulation/Gait assistance: Min assist, +2 safety/equipment Gait Distance (Feet): 15 Feet(around the bed to the chair) Assistive device: Rolling walker (2 wheeled) Gait Pattern/deviations: Step-through pattern General Gait Details: mildly unsteady with R side hip drop with swing through Gait velocity: slow Gait velocity interpretation: <1.31 ft/sec, indicative of household ambulator    ADL: ADL Overall ADL's : Needs assistance/impaired Eating/Feeding: Set up, Sitting Grooming: Minimal assistance, Sitting Upper Body Bathing: Minimal assistance, Standing Lower Body Bathing: Minimal assistance, Sit to/from stand Upper Body Dressing : Minimal assistance, Sitting Lower Body Dressing: Minimal  assistance, Sit to/from stand Toilet Transfer: Minimal assistance, Ambulation, RW Toilet Transfer Details (indicate cue type and reason): Assist due to posterior bias and L hip weakness.  Toileting- Clothing Manipulation and Hygiene: Minimal assistance, Sit to/from stand Functional mobility during ADLs: Minimal assistance, Rolling walker General ADL Comments: Pt able to complete figure 4 method for LB ADL tasks.   Cognition: Cognition Overall Cognitive Status: Impaired/Different from baseline Orientation Level: Oriented X4 Cognition Arousal/Alertness: Awake/alert Behavior During Therapy: WFL for tasks assessed/performed, Flat affect Overall Cognitive Status: Impaired/Different from baseline Area of Impairment: Safety/judgement, Memory Memory: Decreased recall of precautions, Decreased short-term memory Safety/Judgement: Decreased awareness of safety, Decreased awareness of deficits General Comments: Pt able to follow commands but with noted decreased memory during session as well as decreased safety awareness.   Blood pressure 137/68, pulse 95, temperature 98 F (36.7 C), temperature source Oral, resp. rate 18, height 5\' 4"  (1.626 m), weight 78 kg (171 lb 15.3 oz), SpO2 94 %. Physical Exam  Vitals reviewed. HENT:  Head: Normocephalic.  Eyes: EOM are normal.  Neck: Normal range of motion. Neck supple. No thyromegaly present.  Cardiovascular: Normal rate and regular rhythm.  Respiratory: Effort normal and breath sounds normal. No respiratory distress.  GI: Soft. Bowel sounds are normal. She exhibits no distension.  Neurological:  Mood is a bit flat but appropriate.  She provides her name age as well as date of birth.  Follows simple commands.  Skin:  Back incision is dressed  Oriented to person place but not time.  Lethargic but awakens to voice Motor strength is 3- left shoulder 5 right shoulder 5 bilateral biceps triceps grip 2- at the ankle dorsiflexor plantar flexor bilaterally  minus at the knee extensors, 2- bilateral hip flexors, question full effort given lethargy Sensation drifts off during testing unable to assess Results for orders placed or performed during the hospital encounter of 01/26/18 (from the past 24 hour(s))  Glucose, capillary     Status: Abnormal   Collection Time: 01/27/18 11:25 AM  Result Value Ref Range   Glucose-Capillary 264 (H) 70 - 99 mg/dL   Comment 1 Notify RN    Comment 2 Document in Chart   Glucose, capillary     Status: Abnormal   Collection Time: 01/27/18  4:46 PM  Result Value Ref Range   Glucose-Capillary 202 (H) 70 - 99 mg/dL  Glucose, capillary     Status: Abnormal   Collection Time: 01/27/18  9:09 PM  Result Value Ref Range   Glucose-Capillary 200 (H) 70 - 99 mg/dL   Comment 1 Notify RN    Comment 2 Document in Chart   CBC     Status: Abnormal   Collection Time: 01/28/18  3:25 AM  Result Value Ref Range   WBC 9.5 4.0 - 10.5 K/uL   RBC 2.82 (L) 3.87 - 5.11 MIL/uL   Hemoglobin 8.3 (L) 12.0 - 15.0 g/dL   HCT 25.5 (L) 36.0 - 46.0 %   MCV 90.4 78.0 - 100.0 fL   MCH 29.4 26.0 - 34.0 pg   MCHC 32.5 30.0 - 36.0 g/dL   RDW 13.8 11.5 - 15.5 %   Platelets 110 (L) 150 - 400 K/uL  Basic metabolic panel     Status: Abnormal   Collection Time: 01/28/18  3:25 AM  Result Value Ref Range   Sodium 136 135 - 145 mmol/L   Potassium 4.2 3.5 - 5.1 mmol/L   Chloride 105 98 - 111 mmol/L   CO2 27 22 - 32 mmol/L   Glucose, Bld 180 (H) 70 - 99 mg/dL   BUN 18 8 - 23 mg/dL   Creatinine, Ser 0.93 0.44 - 1.00 mg/dL   Calcium 8.3 (L) 8.9 - 10.3 mg/dL   GFR calc non Af Amer 57 (L) >60 mL/min   GFR calc Af Amer >60 >60 mL/min   Anion gap 4 (L) 5 - 15   Dg Lumbar Spine 2-3 Views  Result Date: 01/26/2018 CLINICAL DATA:  Posterior lumbar interbody fusion EXAM: DG C-ARM 61-120 MIN; LUMBAR SPINE - 2-3 VIEW COMPARISON:  Radiograph 08/14/2017 FINDINGS: Four low resolution intraoperative spot views of the lumbar spine. Total fluoroscopy time was 120  seconds. The images demonstrate placement of fixating screws within the thoracolumbar spine with interbody device within the lower lumbar spine. IMPRESSION: Intraoperative fluoroscopic assistance provided during lumbar spine surgery. Electronically Signed   By: Donavan Foil M.D.   On: 01/26/2018 20:40   Dg C-arm 1-60 Min  Result Date: 01/26/2018 CLINICAL DATA:  Posterior lumbar interbody fusion EXAM: DG C-ARM 61-120 MIN; LUMBAR SPINE - 2-3 VIEW COMPARISON:  Radiograph 08/14/2017 FINDINGS: Four low resolution intraoperative spot views of the lumbar spine. Total fluoroscopy time was 120 seconds. The images demonstrate placement of fixating screws within the thoracolumbar spine with interbody device within the lower lumbar spine. IMPRESSION: Intraoperative fluoroscopic assistance provided during lumbar spine surgery. Electronically Signed   By: Donavan Foil M.D.   On: 01/26/2018 20:40   Dg C-arm 1-60 Min  Result Date: 01/26/2018 CLINICAL DATA:  Posterior lumbar interbody fusion EXAM: DG C-ARM 61-120 MIN; LUMBAR SPINE - 2-3 VIEW COMPARISON:  Radiograph 08/14/2017 FINDINGS: Four low resolution intraoperative spot views of the lumbar spine. Total fluoroscopy time was 120 seconds. The images demonstrate placement of fixating screws within the thoracolumbar spine with interbody device within the lower lumbar spine. IMPRESSION: Intraoperative fluoroscopic assistance provided during lumbar spine surgery. Electronically Signed   By: Donavan Foil M.D.   On: 01/26/2018 20:40     Assessment/Plan: Diagnosis: Lumbar spinal stenosis with claudication status post multilevel decompression, L4-5 L5-S1 and fusion T10-S1 1. Does the need for close, 24 hr/day medical supervision in concert with the patient's rehab needs make it unreasonable for this patient to be served in a less intensive setting? Yes 2. Co-Morbidities requiring supervision/potential complications: Pain management, at risk for pneumonia and DVT, bowel  bladder management 3. Due to bladder management, bowel management, safety, skin/wound care, disease management, medication administration, pain management and patient education, does the patient require 24 hr/day rehab nursing? Yes 4. Does the patient require coordinated care of a physician, rehab nurse, PT (1-2 hrs/day, 5 days/week) and OT (1-2 hrs/day, 5 days/week) to address physical and functional deficits in the  context of the above medical diagnosis(es)? Yes Addressing deficits in the following areas: balance, endurance, locomotion, strength, transferring, bowel/bladder control, bathing, dressing, feeding, grooming, toileting, cognition, speech, language, swallowing and psychosocial support 5. Can the patient actively participate in an intensive therapy program of at least 3 hrs of therapy per day at least 5 days per week? Should be able to tolerate but pain may limit therapy participation 6. The potential for patient to make measurable gains while on inpatient rehab is good 7. Anticipated functional outcomes upon discharge from inpatient rehab are supervision  with PT, supervision with OT, n/a with SLP. 8. Estimated rehab length of stay to reach the above functional goals is: 7 to 10 days 9. Anticipated D/C setting: Home 10. Anticipated post D/C treatments: Bath therapy 11. Overall Rehab/Functional Prognosis: good  RECOMMENDATIONS: This patient's condition is appropriate for continued rehabilitative care in the following setting: CIR please see comments below Patient has agreed to participate in recommended program. Potentially Note that insurance prior authorization may be required for reimbursement for recommended care.  Comment: Would recommend reducing hydrocodone to 5 mg to reduce confusion.  Per neurosurgery will need follow-up CBC in the a.m.  "I have personally performed a face to face diagnostic evaluation of this patient.  Additionally, I have reviewed and concur with the physician  assistant's documentation above."  Charlett Blake M.D. [facility] FAAPM&R (Sports Med, Neuromuscular Med) Diplomate Am Board of Electrodiagnostic Med  Cathlyn Parsons, PA-C 01/28/2018

## 2018-01-28 NOTE — Progress Notes (Signed)
Attempted to get patient up to bedside commode and chair with two assist. Patient stating and repeating "I cannot do it." Patient was able to get to the side of the bed and stood up with assistance; however, patient would not stand up straight and was very weak. Unable to get patient to bedside commode or chair this morning on my shift.

## 2018-01-28 NOTE — Social Work (Signed)
CSW f/u for disposition.  30 day note on chart to be signed by doctor to obtain PASSR for SNF placement.  CSW will f/u.  Elissa Hefty, LCSW Clinical Social Worker [phone]

## 2018-01-28 NOTE — Progress Notes (Signed)
Physical Therapy Treatment Patient Details Name: Amy [name] MRN: [id number] DOB: [birth year] Today's Date: 01/28/2018    History of Present Illness  Pt is an 82 y/o female with PMH significant for DM2, rotator cuff repair, admitted with progressively worsening back and leg pain, s/p L45, L5-S1 decompressive laminotomies and foraminotomies, L45, L5-S1 PLIF with autograft and T10-S1 PLA with alografting and fixation.    PT Comments    Improved over the morning session.  Emphasis on scooting, sit to stand, transfers and transitions in the bed.   Follow Up Recommendations  SNF     Equipment Recommendations  None recommended by PT    Recommendations for Other Services       Precautions / Restrictions Precautions Precautions: Back Precaution Booklet Issued: Yes (comment) Required Braces or Orthoses: Spinal Brace Spinal Brace: Lumbar corset;Applied in sitting position    Mobility  Bed Mobility Overal bed mobility: Needs Assistance Bed Mobility: Sit to Sidelying         Sit to sidelying: Mod assist General bed mobility comments: cues for technique and assist to control descent and help legs into the bed.  Transfers Overall transfer level: Needs assistance Equipment used: Rolling walker (2 wheeled) Transfers: Sit to/from [facility] Sit to Stand: Min assist;Mod assist Stand pivot transfers: Min assist       General transfer comment: cues for hand placement and assist to come forward with minor need for boost..  pt did not scoot well to EOB and needed mod assist.  frequent cues for appropriate hand placement  Ambulation/Gait                 Stairs             Wheelchair Mobility    Modified Rankin (Stroke Patients Only)       Balance     Sitting balance-Leahy Scale: Poor       Standing balance-Leahy Scale: Poor Standing balance comment: reliant on the RW and external support                             Cognition Arousal/Alertness: Awake/alert Behavior During Therapy: WFL for tasks assessed/performed;Flat affect Overall Cognitive Status: Impaired/Different from baseline                                 General Comments: Somewhat clearer of thought this afternoon.      Exercises      General Comments        Pertinent Vitals/Pain Pain Assessment: Faces Faces Pain Scale: Hurts even more Pain Location: back Pain Descriptors / Indicators: Aching;Grimacing;Guarding Pain Intervention(s): Monitored during session    Home Living                      Prior Function            PT Goals (current goals can now be found in the care plan section) Acute Rehab PT Goals Patient Stated Goal: stop the pain PT Goal Formulation: With patient Time For Goal Achievement: 02/10/18 Potential to Achieve Goals: Good Progress towards PT goals: Progressing toward goals    Frequency    Min 5X/week      PT Plan Current plan remains appropriate    Co-evaluation              AM-PAC PT "6 Clicks" Daily Activity  Outcome  Measure  Difficulty turning over in bed (including adjusting bedclothes, sheets and blankets)?: Unable Difficulty moving from lying on back to sitting on the side of the bed? : Unable Difficulty sitting down on and standing up from a chair with arms (e.g., wheelchair, bedside commode, etc,.)?: Unable Help needed moving to and from a bed to chair (including a wheelchair)?: A Lot Help needed walking in hospital room?: A Little Help needed climbing 3-5 steps with a railing? : A Lot 6 Click Score: 10    End of Session   Activity Tolerance: Patient tolerated treatment well Patient left: in bed;with call bell/phone within reach Nurse Communication: Mobility status PT Visit Diagnosis: Other abnormalities of gait and mobility (R26.89);Pain Pain - part of body: (back)     Time: 9597-4718 PT Time Calculation (min) (ACUTE ONLY): 19 min  Charges:   $Therapeutic Activity: 8-22 mins                    G Codes:       February 17, 2018  Donnella Sham, PT [phone]  (pager)   Tessie Fass Aleksander Edmiston February 17, 2018, 5:07 PM

## 2018-01-28 NOTE — Care Management Note (Signed)
Case Management Note  Patient Details  Name: CAMARIA GERALD MRN: [id number] Date of Birth: [birth year]  Subjective/Objective:   Pt admitted on 01/26/18 s/p L45, L5-S1 decompressive laminotomies and foraminotomies, L45, L5-S1 PLIF with autograft and T10-S1 PLA with alografting and fixation.  PTA, pt independent with assistive devices; lives alone.                 Action/Plan: PT/OT recommending SNF for short term rehab.  CSW consulted to facilitate dc to SNF upon medical stability.  Expected Discharge Date:                  Expected Discharge Plan:  Skilled Nursing Facility  In-House Referral:  Clinical Social Work  Discharge planning Services  CM Consult  Post Acute Care Choice:    Choice offered to:     DME Arranged:    DME Agency:     HH Arranged:    [place] Agency:     Status of Service:  In process, will continue to follow  If discussed at Long Length of Stay Meetings, dates discussed:    Additional Comments:  Reinaldo Raddle, RN, BSN  Trauma/Neuro ICU Case Manager [phone]

## 2018-01-28 NOTE — NC FL2 (Signed)
[place] LEVEL OF CARE SCREENING TOOL     IDENTIFICATION  Patient Name: Amy [name] Birthdate: [birth year] Sex: female Admission Date (Current Location): 01/26/2018  [facility] and [place] Number:  [employer] and Address:  [facility], [place] [address], [place], [place] [address]      Provider Number: [id number]  Attending Physician Name and Address:  Earnie Larsson, MD  Relative Name and Phone Number:  Dorthula Matas, son, [phone]    Current Level of Care: Hospital Recommended Level of Care: [place] Prior Approval Number:    Date Approved/Denied:   PASRR Number: pending  Discharge Plan: SNF    Current Diagnoses: Patient Active Problem List   Diagnosis Date Noted  . Status post surgery 01/26/2018  . Surgery, elective 01/26/2018  . Degenerative spondylolisthesis 01/26/2018  . Osteoporosis 12/13/2016  . Osteopenia 11/26/2016  . Invasive ductal carcinoma of breast, female, right ([place])   . Lumbago 03/22/2014  . Stiffness of joint, not elsewhere classified, pelvic region and thigh 03/22/2014  . Muscle weakness (generalized) 03/22/2014  . Abnormality of gait 03/22/2014  . Depression with anxiety 09/03/2012  . Insomnia secondary to depression with anxiety 09/03/2012  . Pain 09/03/2012    Orientation RESPIRATION BLADDER Height & Weight     Self  Normal Incontinent Weight: 171 lb 15.3 oz (78 kg) Height:  5\' 4"  (162.6 cm)  BEHAVIORAL SYMPTOMS/MOOD NEUROLOGICAL BOWEL NUTRITION STATUS      Continent Diet(See DC summary )  AMBULATORY STATUS COMMUNICATION OF NEEDS Skin   Limited Assist Verbally Surgical wounds, Other (Comment)(Bilateral L4-L5 L5-S1 decompressive laminotomies with bilateral L4-L5 L5-S1 decompressive foraminotomies,)                       Personal Care Assistance Level of Assistance  Bathing, Feeding, Dressing Bathing Assistance: Limited assistance Feeding assistance: Limited  assistance Dressing Assistance: Limited assistance     Functional Limitations Info  Sight, Hearing, Speech Sight Info: Adequate Hearing Info: Adequate Speech Info: Adequate    SPECIAL CARE FACTORS FREQUENCY  PT (By licensed PT), OT (By licensed OT)     PT Frequency: 5x week OT Frequency: 5x week            Contractures      Additional Factors Info  Code Status, Allergies, Psychotropic, Insulin Sliding Scale Code Status Info: Full Allergies Info: CYMBALTA DULOXETINE HCL  Psychotropic Info: Lexapro, Risperdal, Effexor, Trazadone Insulin Sliding Scale Info: Insulin Daily       Current Medications (01/28/2018):  This is the current hospital active medication list Current Facility-Administered Medications  Medication Dose Route Frequency Provider Last Rate Last Dose  . 0.9 %  sodium chloride infusion (Manually program via Guardrails IV Fluids)   Intravenous Once Earnie Larsson, MD      . 0.9 %  sodium chloride infusion  250 mL Intravenous Continuous Pool, Henry, MD      . 0.9 % NaCl with KCl 20 mEq/ L  infusion   Intravenous Continuous Earnie Larsson, MD 10 mL/hr at 01/28/18 1600    . acetaminophen (TYLENOL) tablet 650 mg  650 mg Oral Q4H PRN Earnie Larsson, MD   650 mg at 01/27/18 0601   Or  . acetaminophen (TYLENOL) suppository 650 mg  650 mg Rectal Q4H PRN Earnie Larsson, MD      . ALPRAZolam Duanne Moron) tablet 0.5 mg  0.5 mg Oral QHS PRN Earnie Larsson, MD      . bisacodyl (  DULCOLAX) suppository 10 mg  10 mg Rectal Daily PRN Earnie Larsson, MD      . calcium-vitamin D (OSCAL WITH D) 500-200 MG-UNIT per tablet 2 tablet  2 tablet Oral BID Earnie Larsson, MD   2 tablet at 01/28/18 0900  . cholecalciferol (VITAMIN D) tablet 1,000 Units  1,000 Units Oral Daily Earnie Larsson, MD   1,000 Units at 01/28/18 0900  . diazepam (VALIUM) tablet 5-10 mg  5-10 mg Oral Q6H PRN Earnie Larsson, MD   5 mg at 01/28/18 8127  . docusate sodium (COLACE) capsule 100 mg  100 mg Oral PRN Earnie Larsson, MD      . escitalopram  (LEXAPRO) tablet 20 mg  20 mg Oral Daily Earnie Larsson, MD   20 mg at 01/28/18 0900  . HYDROcodone-acetaminophen (NORCO) 10-325 MG per tablet 1 tablet  1 tablet Oral Q4H PRN Earnie Larsson, MD   1 tablet at 01/28/18 0900  . HYDROmorphone (DILAUDID) injection 1 mg  1 mg Intravenous Q2H PRN Earnie Larsson, MD   1 mg at 01/26/18 2227  . insulin aspart (novoLOG) injection 0-15 Units  0-15 Units Subcutaneous TID WC Earnie Larsson, MD   2 Units at 01/28/18 1218  . lactated ringers infusion   Intravenous Continuous Earnie Larsson, MD   Stopped at 01/26/18 2038  . letrozole [facility]) tablet 2.5 mg  2.5 mg Oral Daily Earnie Larsson, MD   2.5 mg at 01/28/18 0900  . menthol-cetylpyridinium (CEPACOL) lozenge 3 mg  1 lozenge Oral PRN Earnie Larsson, MD       Or  . phenol (CHLORASEPTIC) mouth spray 1 spray  1 spray Mouth/Throat PRN Earnie Larsson, MD      . metFORMIN (GLUCOPHAGE) tablet 500 mg  500 mg Oral BID WC Earnie Larsson, MD   500 mg at 01/28/18 0900  . ondansetron (ZOFRAN) tablet 4 mg  4 mg Oral Q6H PRN Earnie Larsson, MD       Or  . ondansetron [facility]) injection 4 mg  4 mg Intravenous Q6H PRN Earnie Larsson, MD      . oxyCODONE (Oxy IR/ROXICODONE) immediate release tablet 10 mg  10 mg Oral Q3H PRN Earnie Larsson, MD   10 mg at 01/28/18 1506  . polyethylene glycol (MIRALAX / GLYCOLAX) packet 17 g  17 g Oral Daily PRN Earnie Larsson, MD      . risperiDONE (RISPERDAL) tablet 1 mg  1 mg Oral QHS Earnie Larsson, MD   1 mg at 01/27/18 2209  . sodium chloride flush (NS) 0.9 % injection 3 mL  3 mL Intravenous Q12H Earnie Larsson, MD   3 mL at 01/28/18 0900  . sodium chloride flush (NS) 0.9 % injection 3 mL  3 mL Intravenous PRN Earnie Larsson, MD      . sodium phosphate (FLEET) 7-19 GM/118ML enema 1 enema  1 enema Rectal Once PRN Earnie Larsson, MD      . traZODone (DESYREL) tablet 50 mg  50 mg Oral Marti Sleigh, MD   50 mg at 01/27/18 2209  . venlafaxine XR (EFFEXOR-XR) 24 hr capsule 150 mg  150 mg Oral BID Earnie Larsson, MD   150 mg at 01/28/18 1250      Discharge Medications: Please see discharge summary for a list of discharge medications.  Relevant Imaging Results:  Relevant Lab Results:   Additional Information SS#: [id number]  [place], LCSW

## 2018-01-28 NOTE — Progress Notes (Signed)
Physical Therapy Treatment Patient Details Name: Amy [name] MRN: [id number] DOB: June 01, 1936 Today's Date: 01/28/2018    History of Present Illness  Pt is an 82 y/o female with PMH significant for DM2, rotator cuff repair, admitted with progressively worsening back and leg pain, s/p L45, L5-S1 decompressive laminotomies and foraminotomies, L45, L5-S1 PLIF with autograft and T10-S1 PLA with alografting and fixation.    PT Comments    Pt more confused today, needing more assist and cuing in general.  Emphasis on bed mobility, transitions, donning the brace and gait training.   Follow Up Recommendations  SNF     Equipment Recommendations  (TBA)    Recommendations for Other Services Rehab consult     Precautions / Restrictions Precautions Precautions: Back Precaution Booklet Issued: Yes (comment) Required Braces or Orthoses: Spinal Brace Spinal Brace: Lumbar corset;Applied in sitting position    Mobility  Bed Mobility Overal bed mobility: Needs Assistance Bed Mobility: Rolling;Sidelying to Sit Rolling: Mod assist Sidelying to sit: Mod assist       General bed mobility comments: cues for technique and truncal assist while pt assisted to come up.  Transfers Overall transfer level: Needs assistance Equipment used: Rolling walker (2 wheeled) Transfers: Sit to/from [facility] Sit to Stand: Min assist;Mod assist;+2 physical assistance Stand pivot transfers: Min assist       General transfer comment: cues for hand placement and assist to come forward with minor need for boost.  Ambulation/Gait Ambulation/Gait assistance: Min assist;Mod assist(more with fatigue) Gait Distance (Feet): 18 Feet Assistive device: Rolling walker (2 wheeled) Gait Pattern/deviations: Step-through pattern Gait velocity: slow Gait velocity interpretation: <1.8 ft/sec, indicate of risk for recurrent falls General Gait Details: mildly unsteady with R side hip drop with swing  through.  pt more flexed with posture and more weak-kneed today.   Stairs             Wheelchair Mobility    Modified Rankin (Stroke Patients Only)       Balance     Sitting balance-Leahy Scale: Poor Sitting balance - Comments: pt having difficulty sitting without 1 UE assist, not assisting with back brace today   Standing balance support: Bilateral upper extremity supported;During functional activity Standing balance-Leahy Scale: Poor Standing balance comment: reliant on the RW and external support                            Cognition Arousal/Alertness: Awake/alert Behavior During Therapy: WFL for tasks assessed/performed;Flat affect Overall Cognitive Status: Impaired/Different from baseline Area of Impairment: Memory;Awareness;Safety/judgement;Problem solving                     Memory: Decreased recall of precautions;Decreased short-term memory   Safety/Judgement: Decreased awareness of safety;Decreased awareness of deficits Awareness: Intellectual Problem Solving: Slow processing;Difficulty sequencing;Requires verbal cues General Comments: pt less able to follow instruction.  Noticeably more confused.      Exercises      General Comments        Pertinent Vitals/Pain Pain Assessment: 0-10 Pain Score: 7  Pain Location: back Pain Descriptors / Indicators: Aching;Grimacing;Guarding Pain Intervention(s): Monitored during session;Limited activity within patient's tolerance    Home Living                      Prior Function            PT Goals (current goals can now be found in the care plan section)  Acute Rehab PT Goals Patient Stated Goal: stop the pain PT Goal Formulation: With patient Time For Goal Achievement: 02/10/18 Potential to Achieve Goals: Good Progress towards PT goals: Progressing toward goals    Frequency    Min 5X/week      PT Plan Current plan remains appropriate    Co-evaluation               AM-PAC PT "6 Clicks" Daily Activity  Outcome Measure  Difficulty turning over in bed (including adjusting bedclothes, sheets and blankets)?: Unable Difficulty moving from lying on back to sitting on the side of the bed? : Unable Difficulty sitting down on and standing up from a chair with arms (e.g., wheelchair, bedside commode, etc,.)?: Unable Help needed moving to and from a bed to chair (including a wheelchair)?: A Little Help needed walking in hospital room?: A Lot Help needed climbing 3-5 steps with a railing? : A Lot 6 Click Score: 10    End of Session   Activity Tolerance: Patient tolerated treatment well Patient left: in chair;with call bell/phone within reach;with family/visitor present Nurse Communication: Mobility status PT Visit Diagnosis: Other abnormalities of gait and mobility (R26.89);Pain Pain - part of body: (back)     Time: 4818-5909 PT Time Calculation (min) (ACUTE ONLY): 38 min  Charges:  $Gait Training: 8-22 mins $Therapeutic Activity: 8-22 mins $Self Care/Home Management: 8-22                    G Codes:       30-Jan-2018  Donnella Sham, PT [phone]  (pager)   Amy [name] 01/30/18, 12:44 PM

## 2018-01-28 NOTE — Progress Notes (Signed)
Inpatient Rehabilitation Admissions Coordinator  I met with patient and her daughter in law at bedside. We discussed goals and expectations of an inpt rehab admission and need for caregiver support after a short rehab stay. Daughter in law states caregiver support is not available at home and that family was looking for SNF rehab to give her a prolonged rehab stay prior to returning home with limited caregiver support. I will alert RN CM and SW. We will sign off at this time.  Danne [facility], RN, MSN Rehab Admissions Coordinator [phone] 01/28/2018 12:45 PM

## 2018-01-28 NOTE — Clinical Social Work Note (Signed)
Clinical Social Work Assessment  Patient Details  Name: Amy [name] MRN: [id number] Date of Birth: [birth year]  Date of referral:  01/28/18               Reason for consult:  Facility Placement                Permission sought to share information with:  Facility Art therapist granted to share information::  Yes, Verbal Permission Granted  Name::        Agency::  SNF  Relationship::     Contact Information:     Housing/Transportation Living arrangements for the past 2 months:  Single Family Home Source of Information:  Patient Patient Interpreter Needed:  None Criminal Activity/Legal Involvement Pertinent to Current Situation/Hospitalization:  No - Comment as needed Significant Relationships:  Adult Children, Siblings Lives with:  Self, Pets(Dog, Charlie) Do you feel safe going back to the place where you live?  No Need for family participation in patient care:  Yes (Comment)  Care giving concerns:  Pt from home alone and will need SNF at discharge.  Social Worker assessment / plan:  CSW met with patient at bedside. Pt oriented to self and situation, however had moments of confusion. Pt indicated that she would be agreeable to SNF. CSW explained SNF process, placement and insurance auth needed.  CSW obtained permission to send referral to [place], [place] and [place]. Pt declined to have any one else in family assist with placement and advised CSW that she is the decision maker.   CSW will f/u for disposition.  Employment status:  Retired Nurse, adult PT Recommendations:  [place] / Referral to community resources:  [place]  Patient/Family's Response to care:  E[place].  Patient/Family's Understanding of and Emotional Response to Diagnosis, Current Treatment, and Prognosis:  Pt has some understanding of her diagnosis and impairment. Pt appears to be in a good emotional  state. Pt desires in patient rehab however she will need SNF. CSW explained that to patient.  Pt will return home once rehabilitative therapies are complete.  Emotional Assessment Appearance:  Appears stated age Attitude/Demeanor/Rapport:  (Cooperative) Affect (typically observed):  Accepting, Other(Some Confusion) Orientation:  Oriented to Self, Oriented to Situation Alcohol / Substance use:  Not Applicable Psych involvement (Current and /or in the community):  No (Comment)  Discharge Needs  Concerns to be addressed:  Discharge Planning Concerns Readmission within the last 30 days:  No Current discharge risk:  Dependent with Mobility, Lives alone, Physical Impairment Barriers to Discharge:  No Barriers Identified   Amy [facility], LCSW 01/28/2018, 4:35 PM

## 2018-01-28 NOTE — Progress Notes (Signed)
Postop day 2.  Patient with more pain in the back today.  Some pain at her lumbosacral junction.  Still with no radiating pain, numbness or weakness.  She is wide awake and pleasant.  She does not appear to be in any distress.  She is afebrile.  Heart rate has normalized.  Blood pressure good.  Urine output marginal.  Saturations good.  Drain output lessening.  Motor and sensory examination intact.  Wound dressing clean and dry.  Abdomen soft with normal bowel sounds.  Follow-up hematocrit trending down now 25.5.  Overall progressing as would be expected following her multilevel thoracic lumbar sacral fusion.  Continue efforts at pain management and mobilization.  Check follow-up H&H in the morning.

## 2018-01-29 LAB — CBC
HCT: 24.2 % — ABNORMAL LOW (ref 36.0–46.0)
Hemoglobin: 8 g/dL — ABNORMAL LOW (ref 12.0–15.0)
MCH: 29.7 pg (ref 26.0–34.0)
MCHC: 33.1 g/dL (ref 30.0–36.0)
MCV: 90 fL (ref 78.0–100.0)
Platelets: 113 10*3/uL — ABNORMAL LOW (ref 150–400)
RBC: 2.69 MIL/uL — ABNORMAL LOW (ref 3.87–5.11)
RDW: 13.4 % (ref 11.5–15.5)
WBC: 9 10*3/uL (ref 4.0–10.5)

## 2018-01-29 LAB — GLUCOSE, CAPILLARY
Glucose-Capillary: 133 mg/dL — ABNORMAL HIGH (ref 70–99)
Glucose-Capillary: 193 mg/dL — ABNORMAL HIGH (ref 70–99)
Glucose-Capillary: 146 mg/dL — ABNORMAL HIGH (ref 70–99)
Glucose-Capillary: 135 mg/dL — ABNORMAL HIGH (ref 70–99)
Glucose-Capillary: 111 mg/dL — ABNORMAL HIGH (ref 70–99)

## 2018-01-29 NOTE — Plan of Care (Signed)
Pt had small bm today

## 2018-01-29 NOTE — Progress Notes (Signed)
Physical Therapy Treatment Patient Details Name: Amy [name] MRN: [id number] DOB: [birth year] Today's Date: 01/29/2018    History of Present Illness  Pt is an 82 y/o female with PMH significant for DM2, rotator cuff repair, admitted with progressively worsening back and leg pain, s/p L45, L5-S1 decompressive laminotomies and foraminotomies, L45, L5-S1 PLIF with autograft and T10-S1 PLA with alografting and fixation.    PT Comments    Pt making slow progress, limited by her mentation and pain.  Emphasis on education, bed mobility, transfers and progression of gait.    Follow Up Recommendations  SNF     Equipment Recommendations  None recommended by PT    Recommendations for Other Services       Precautions / Restrictions Precautions Precautions: Back Precaution Booklet Issued: Yes (comment) Required Braces or Orthoses: Spinal Brace Spinal Brace: Lumbar corset;Applied in sitting position    Mobility  Bed Mobility Overal bed mobility: Needs Assistance Bed Mobility: Rolling;Sidelying to Sit Rolling: Mod assist Sidelying to sit: Mod assist       General bed mobility comments: Continued need for cues, sequencing, and assist for roll and truncal assist to come up.  Transfers Overall transfer level: Needs assistance Equipment used: Rolling walker (2 wheeled) Transfers: Sit to/from Stand Sit to Stand: Min assist;Mod assist(mod as she fatigued)         General transfer comment: cues for hand placement and assist to come forward with minor need for boost..  pt did not scoot well to EOB and needed mod assist.  frequent cues for appropriate hand placement  Ambulation/Gait Ambulation/Gait assistance: Mod assist   Assistive device: Rolling walker (2 wheeled) Gait Pattern/deviations: Step-through pattern Gait velocity: slow   General Gait Details: mildly unsteady with R side hip drop with swing through.  pt more flexed and listed R today.   Stairs              Wheelchair Mobility    Modified Rankin (Stroke Patients Only)       Balance Overall balance assessment: Needs assistance Sitting-balance support: Bilateral upper extremity supported Sitting balance-Leahy Scale: Poor Sitting balance - Comments: notable list to the right including the head and neck   Standing balance support: Bilateral upper extremity supported;During functional activity Standing balance-Leahy Scale: Poor Standing balance comment: reliant on the RW and external support                            Cognition Arousal/Alertness: Awake/alert Behavior During Therapy: WFL for tasks assessed/performed;Flat affect Overall Cognitive Status: Impaired/Different from baseline                       Memory: Decreased recall of precautions;Decreased short-term memory   Safety/Judgement: Decreased awareness of safety;Decreased awareness of deficits Awareness: Intellectual Problem Solving: Slow processing;Difficulty sequencing;Requires verbal cues;Requires tactile cues        Exercises      General Comments        Pertinent Vitals/Pain Faces Pain Scale: Hurts even more Pain Location: back to L hip Pain Descriptors / Indicators: Aching;Grimacing;Guarding    Home Living                      Prior Function            PT Goals (current goals can now be found in the care plan section) Acute Rehab PT Goals Patient Stated Goal: stop the pain PT Goal  Formulation: With patient Time For Goal Achievement: 02/10/18 Potential to Achieve Goals: Good    Frequency    Min 5X/week      PT Plan Current plan remains appropriate    Co-evaluation              AM-PAC PT "6 Clicks" Daily Activity  Outcome Measure  Difficulty turning over in bed (including adjusting bedclothes, sheets and blankets)?: Unable Difficulty moving from lying on back to sitting on the side of the bed? : Unable Difficulty sitting down on and standing up  from a chair with arms (e.g., wheelchair, bedside commode, etc,.)?: Unable Help needed moving to and from a bed to chair (including a wheelchair)?: A Lot Help needed walking in hospital room?: A Little Help needed climbing 3-5 steps with a railing? : A Little 6 Click Score: 11    End of Session   Activity Tolerance: Patient tolerated treatment well Patient left: in chair;with call bell/phone within reach Nurse Communication: Mobility status PT Visit Diagnosis: Other abnormalities of gait and mobility (R26.89);Pain Pain - part of body: (back and left hip)     Time:  -     Charges:                       G Codes:       February 22, 2018  Amy [name], PT [phone]  (pager)   Amy [name] 02-22-2018, 5:38 PM

## 2018-01-29 NOTE — Progress Notes (Signed)
Subjective: Patient resting in bed, comfortable.  Continuing PT and OT, is going to need rehabilitation once further stabilized.  Hemovac put out 130 cc over past 12 hours, will continue to monitor.  CBC this morning shows H/H drifted down minimally, will recheck on 7/8 a.m.  Objective: Vital signs in last 24 hours: Vitals:   01/29/18 0533 01/29/18 0600 01/29/18 0700 01/29/18 0800  BP:  (!) 157/74 (!) 163/84   Pulse:  (!) 102 (!) 107   Resp:  (!) 21 19   Temp: 99 F (37.2 C)   98.2 F (36.8 C)  TempSrc: Oral   Oral  SpO2:  96% 96%   Weight:      Height:        Intake/Output from previous day: 07/03 0701 - 07/04 0700 In: 219.9 [I.V.:219.9] Out: 965 [Drains:965] Intake/Output this shift: No intake/output data recorded.  Physical Exam: Awake, following commands.  Moving all 4 extremities well.  Minimal bloody drainage into honeycomb dressing.  CBC Recent Labs    01/28/18 0325 01/29/18 0438  WBC 9.5 9.0  HGB 8.3* 8.0*  HCT 25.5* 24.2*  PLT 110* 113*   BMET Recent Labs    01/27/18 0334 01/28/18 0325  NA 137 136  K 4.5 4.2  CL 108 105  CO2 22 27  GLUCOSE 284* 180*  BUN 20 18  CREATININE 1.02* 0.93  CALCIUM 6.9* 8.3*    Assessment/Plan: Continue to recover from extensive surgery earlier this week.  Continuing PT and OT.  Will progress to rehabilitation when further stabilized.   Hosie Spangle, MD 01/29/2018, 9:30 AM

## 2018-01-30 LAB — PREPARE RBC (CROSSMATCH)

## 2018-01-30 LAB — CBC
HCT: 24.7 % — ABNORMAL LOW (ref 36.0–46.0)
Hemoglobin: 8.3 g/dL — ABNORMAL LOW (ref 12.0–15.0)
MCH: 29.6 pg (ref 26.0–34.0)
MCHC: 33.6 g/dL (ref 30.0–36.0)
MCV: 88.2 fL (ref 78.0–100.0)
Platelets: 172 10*3/uL (ref 150–400)
RBC: 2.8 MIL/uL — ABNORMAL LOW (ref 3.87–5.11)
RDW: 13.1 % (ref 11.5–15.5)
WBC: 11.4 10*3/uL — ABNORMAL HIGH (ref 4.0–10.5)

## 2018-01-30 LAB — GLUCOSE, CAPILLARY
Glucose-Capillary: 135 mg/dL — ABNORMAL HIGH (ref 70–99)
Glucose-Capillary: 136 mg/dL — ABNORMAL HIGH (ref 70–99)
Glucose-Capillary: 121 mg/dL — ABNORMAL HIGH (ref 70–99)
Glucose-Capillary: 186 mg/dL — ABNORMAL HIGH (ref 70–99)
Glucose-Capillary: 117 mg/dL — ABNORMAL HIGH (ref 70–99)

## 2018-01-30 LAB — HEMOGLOBIN AND HEMATOCRIT, BLOOD
HCT: 34.3 % — ABNORMAL LOW (ref 36.0–46.0)
Hemoglobin: 11.7 g/dL — ABNORMAL LOW (ref 12.0–15.0)

## 2018-01-30 MED ORDER — SODIUM CHLORIDE 0.9% IV SOLUTION
Freq: Once | INTRAVENOUS | Status: AC
  Administered 2018-01-30: 14:00:00 via INTRAVENOUS

## 2018-01-30 MED ORDER — DIPHENHYDRAMINE HCL 50 MG/ML IJ SOLN
25.0000 mg | Freq: Once | INTRAMUSCULAR | Status: AC
  Administered 2018-01-30: 25 mg via INTRAVENOUS
  Filled 2018-01-30: qty 1

## 2018-01-30 NOTE — Progress Notes (Signed)
Neurosurgery Progress Note  No issues overnight.  Endorses incisional pain although pre op much improved. No focal deficits.  Endorses lightheadedness when going from sitting to standing. Noted anemia. Denies blood in stool/black tarry stools. No history of anemia.  EXAM:  BP 118/62   Pulse (!) 105   Temp 99.6 F (37.6 C) (Axillary)   Resp 19   Ht 5\' 4"  (1.626 m)   Wt 78 kg (171 lb 15.3 oz)   SpO2 96%   BMI 29.52 kg/m   Awake, alert, oriented  Speech fluent, appropriate  MAEW with good strength Bandage with bloody discharge  PLAN Stable this am post operatively - Continue to work with therapy - Will transfer to stepdown - Hopeful to be ready for SNF in 1-2 days  Anemia - Repeat CBC pending - Endorses symptoms consistent with symptomatic anemia. Will likely need transfusion. Will await CBC

## 2018-01-30 NOTE — Progress Notes (Signed)
Neurosurgery Progress Note  Reviewed CBC lab ordered earlier  CBC Latest Ref Rng & Units 01/30/2018 01/29/2018 01/28/2018  WBC 4.0 - 10.5 K/uL 11.4(H) 9.0 9.5  Hemoglobin 12.0 - 15.0 g/dL 8.3(L) 8.0(L) 8.3(L)  Hematocrit 36.0 - 46.0 % 24.7(L) 24.2(L) 25.5(L)  Platelets 150 - 400 K/uL 172 113(L) 110(L)   Relatively stable/slightly improved from yesterday She does endorse lightheadedness when going upright. Will transfuse two units.

## 2018-01-30 NOTE — Progress Notes (Signed)
Patient arrived to the unit @2145 , alert and oriented x4,  Vital signs within normal limits, tele initiated, has surgical incision on posterior and lateral back with dressing and hemovac. Back brace in the room for out of bed use. CBG is WNL.Normal saline with K20 infusing @10ml . Bed is positioned at the lowest, call bell and personal belongings within reach. Was reoriented to the room, and instructed to to use the call bell whenever possible. Patient verbalizes understanding and  acknowledge all instructions. Will continue to monitor and provide appropriate call.

## 2018-01-30 NOTE — Progress Notes (Signed)
Physical Therapy Treatment Patient Details Name: Amy [name] MRN: [id number] DOB: 1936/05/11 Today's Date: 01/30/2018    History of Present Illness  Pt is an 82 y/o female with PMH significant for DM2, rotator cuff repair, admitted with progressively worsening back and leg pain, s/p L45, L5-S1 decompressive laminotomies and foraminotomies, L45, L5-S1 PLIF with autograft and T10-S1 PLA with alografting and fixation.    PT Comments    Making steady progress, tempered by waxing/waning cognition.  Emphasis on education, transfers and progressing gait.    Follow Up Recommendations  SNF     Equipment Recommendations  None recommended by PT    Recommendations for Other Services       Precautions / Restrictions Precautions Precautions: Back Required Braces or Orthoses: Spinal Brace Spinal Brace: Lumbar corset;Applied in sitting position    Mobility  Bed Mobility Overal bed mobility: Needs Assistance Bed Mobility: Rolling;Sidelying to Sit Rolling: Mod assist;Min assist Sidelying to sit: Mod assist       General bed mobility comments: Continued need for cues, sequencing, and assist for roll and truncal assist to come up.  Transfers Overall transfer level: Needs assistance Equipment used: Rolling walker (2 wheeled) Transfers: Sit to/from Stand Sit to Stand: Min assist         General transfer comment: cues for hand placement and assist to come forward with minor need for boost..  pt did not scoot well to EOB and needed mod assist.  frequent cues for appropriate hand placement  Ambulation/Gait Ambulation/Gait assistance: Mod assist Gait Distance (Feet): 50 Feet Assistive device: Rolling walker (2 wheeled) Gait Pattern/deviations: Step-through pattern Gait velocity: slow   General Gait Details: For most of the gait trial, pt was flexed, but generally close to midline with slight list to the right.   Cues for upright posture.   Stairs             Wheelchair  Mobility    Modified Rankin (Stroke Patients Only)       Balance   Sitting-balance support: Single extremity supported;No upper extremity supported Sitting balance-Leahy Scale: Fair Sitting balance - Comments: able to assist donning the brace today.   Standing balance support: Bilateral upper extremity supported;During functional activity Standing balance-Leahy Scale: Poor Standing balance comment: reliant on the RW and external support                            Cognition Arousal/Alertness: Awake/alert Behavior During Therapy: WFL for tasks assessed/performed;Flat affect Overall Cognitive Status: Impaired/Different from baseline                                 General Comments: Somewhat clearer of thought this afternoon.  But poor ST memory      Exercises      General Comments General comments (skin integrity, edema, etc.): pt's son and dtr as well as pt instructed in all back care/prec.      Pertinent Vitals/Pain Pain Assessment: Faces Faces Pain Scale: Hurts little more Pain Location: back to L hip Pain Descriptors / Indicators: Aching;Grimacing;Guarding Pain Intervention(s): Monitored during session    Home Living                      Prior Function            PT Goals (current goals can now be found in the care plan section) Acute  Rehab PT Goals PT Goal Formulation: With patient Time For Goal Achievement: 02/10/18 Potential to Achieve Goals: Good Progress towards PT goals: Progressing toward goals    Frequency    Min 5X/week      PT Plan Current plan remains appropriate    Co-evaluation              AM-PAC PT "6 Clicks" Daily Activity  Outcome Measure  Difficulty turning over in bed (including adjusting bedclothes, sheets and blankets)?: Unable Difficulty moving from lying on back to sitting on the side of the bed? : Unable Difficulty sitting down on and standing up from a chair with arms (e.g.,  wheelchair, bedside commode, etc,.)?: Unable Help needed moving to and from a bed to chair (including a wheelchair)?: A Little Help needed walking in hospital room?: A Lot Help needed climbing 3-5 steps with a railing? : A Lot 6 Click Score: 10    End of Session   Activity Tolerance: Patient tolerated treatment well;Patient limited by fatigue Patient left: in chair;with call bell/phone within reach Nurse Communication: Mobility status PT Visit Diagnosis: Other abnormalities of gait and mobility (R26.89);Pain Pain - part of body: (back)     Time: 1710-1748 PT Time Calculation (min) (ACUTE ONLY): 38 min  Charges:  $Gait Training: 8-22 mins $Therapeutic Activity: 8-22 mins $Self Care/Home Management: 8-22                    G Codes:       02/01/18  Donnella Sham, PT [phone]  (pager)   Tessie Fass Tycen Dockter 02/01/2018, 6:00 PM

## 2018-01-30 NOTE — Social Work (Signed)
CSW f/u on disposition.  CSW provided daughter Scheryl Marten, [phone] SNF offers to review with patient.  CSW explained to daughter that patient has Bernadene Person and will need Insurance Auth to go to SNF. CSW advised that they need to select SNF as soon as possible so that SNF anc initiate Insurance Auth on next business day.  Clinicals faxed to [employer] to obtain PASSR for SNF placement.  CSW will f/u.  Elissa Hefty, LCSW Clinical Social Worker [phone]

## 2018-01-31 LAB — TYPE AND SCREEN
ABO/RH(D): O POS
Antibody Screen: NEGATIVE
Unit division: 0
Unit division: 0

## 2018-01-31 LAB — CBC
HCT: 32.5 % — ABNORMAL LOW (ref 36.0–46.0)
Hemoglobin: 10.8 g/dL — ABNORMAL LOW (ref 12.0–15.0)
MCH: 30.1 pg (ref 26.0–34.0)
MCHC: 33.2 g/dL (ref 30.0–36.0)
MCV: 90.5 fL (ref 78.0–100.0)
Platelets: 181 10*3/uL (ref 150–400)
RBC: 3.59 MIL/uL — ABNORMAL LOW (ref 3.87–5.11)
RDW: 13.2 % (ref 11.5–15.5)
WBC: 8.6 10*3/uL (ref 4.0–10.5)

## 2018-01-31 LAB — GLUCOSE, CAPILLARY
Glucose-Capillary: 161 mg/dL — ABNORMAL HIGH (ref 70–99)
Glucose-Capillary: 137 mg/dL — ABNORMAL HIGH (ref 70–99)
Glucose-Capillary: 158 mg/dL — ABNORMAL HIGH (ref 70–99)
Glucose-Capillary: 100 mg/dL — ABNORMAL HIGH (ref 70–99)

## 2018-01-31 LAB — BPAM RBC
Blood Product Expiration Date: 201908062359
Blood Product Expiration Date: 201908062359
ISSUE DATE / TIME: 201907051407
ISSUE DATE / TIME: 201907051407
Unit Type and Rh: 5100
Unit Type and Rh: 5100

## 2018-01-31 NOTE — Clinical Social Work Note (Addendum)
CSW spoke with patient and her daughter at bedside. Patient stated her first preference SNF is [facility] but they are full. Per daughter, CSW yesterday told her they do not take her insurance as well. Second preference facility is [facility]. 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 CSW left message for admissions coordinator to notify.  Dayton Scrape, [place]  12:25 pm [facility] will try to start insurance authorization today.  Dayton Scrape, [place]

## 2018-01-31 NOTE — Progress Notes (Signed)
Neurosurgery Progress Note  No issues overnight.  Given 2 units PRBC which improved anemia and improved lightheadedness No concerns this am  EXAM:  BP (!) 126/56 (BP Location: Left Arm)   Pulse 92   Temp 98 F (36.7 C) (Oral)   Resp 16   Ht 5\' 4"  (1.626 m)   Wt 81.9 kg (180 lb 8.9 oz)   SpO2 96%   BMI 30.99 kg/m   Awake, alert, oriented  Speech fluent, appropriate  MAEW with good strength Incision c/d/i  CBC Latest Ref Rng & Units 01/31/2018 01/30/2018 01/30/2018  WBC 4.0 - 10.5 K/uL 8.6 - 11.4(H)  Hemoglobin 12.0 - 15.0 g/dL 10.8(L) 11.7(L) 8.3(L)  Hematocrit 36.0 - 46.0 % 32.5(L) 34.3(L) 24.7(L)  Platelets 150 - 400 K/uL 181 - 172    PLAN Stable this am Will continue to work on pain control. Continue therapy  Anemia: slight drop in H/H. ?diluational as no active drainage or signs of bleeding. Continue to trend CBC Hopeful for d/c tomorrow/Monday

## 2018-01-31 NOTE — Progress Notes (Signed)
Nurse assessed patient incision. The honeycomb and guazes had old blood drainage. Nurse removed old dressing. Steri strip still in place. New honeycomb dressing placed. Will continue to monitor

## 2018-02-01 LAB — COMPREHENSIVE METABOLIC PANEL
ALT: 18 U/L (ref 0–44)
AST: 20 U/L (ref 15–41)
Albumin: 2.4 g/dL — ABNORMAL LOW (ref 3.5–5.0)
Alkaline Phosphatase: 48 U/L (ref 38–126)
Anion gap: 10 (ref 5–15)
BUN: 13 mg/dL (ref 8–23)
CO2: 27 mmol/L (ref 22–32)
Calcium: 8.9 mg/dL (ref 8.9–10.3)
Chloride: 97 mmol/L — ABNORMAL LOW (ref 98–111)
Creatinine, Ser: 0.77 mg/dL (ref 0.44–1.00)
GFR calc Af Amer: >60 mL/min (ref >60)
GFR calc non Af Amer: >60 mL/min (ref >60)
Glucose, Bld: 123 mg/dL — ABNORMAL HIGH (ref 70–99)
Potassium: 3.6 mmol/L (ref 3.5–5.1)
Sodium: 134 mmol/L — ABNORMAL LOW (ref 135–145)
Total Bilirubin: 0.6 mg/dL (ref 0.3–1.2)
Total Protein: 5.3 g/dL — ABNORMAL LOW (ref 6.5–8.1)

## 2018-02-01 LAB — CBC
HCT: 35.2 % — ABNORMAL LOW (ref 36.0–46.0)
Hemoglobin: 11.5 g/dL — ABNORMAL LOW (ref 12.0–15.0)
MCH: 30.1 pg (ref 26.0–34.0)
MCHC: 32.7 g/dL (ref 30.0–36.0)
MCV: 92.1 fL (ref 78.0–100.0)
Platelets: 232 10*3/uL (ref 150–400)
RBC: 3.82 MIL/uL — ABNORMAL LOW (ref 3.87–5.11)
RDW: 13.1 % (ref 11.5–15.5)
WBC: 8.9 10*3/uL (ref 4.0–10.5)

## 2018-02-01 LAB — GLUCOSE, CAPILLARY
Glucose-Capillary: 111 mg/dL — ABNORMAL HIGH (ref 70–99)
Glucose-Capillary: 106 mg/dL — ABNORMAL HIGH (ref 70–99)
Glucose-Capillary: 171 mg/dL — ABNORMAL HIGH (ref 70–99)
Glucose-Capillary: 150 mg/dL — ABNORMAL HIGH (ref 70–99)

## 2018-02-01 LAB — MAGNESIUM: Magnesium: 1.6 mg/dL — ABNORMAL LOW (ref 1.7–2.4)

## 2018-02-01 MED ORDER — SENNA 8.6 MG PO TABS
1.0000 | ORAL_TABLET | Freq: Every day | ORAL | Status: DC
  Administered 2018-02-02: 8.6 mg via ORAL
  Filled 2018-02-01: qty 1

## 2018-02-01 NOTE — Progress Notes (Signed)
Neurosurgery Progress Note  No issues overnight.  Pain much improved. Back soreness not unexpected.  EXAM:  BP 115/66 (BP Location: Right Arm)   Pulse 93   Temp 97.7 F (36.5 C) (Oral)   Resp 15   Ht 5\' 4"  (1.626 m)   Wt 81.9 kg (180 lb 8.9 oz)   SpO2 98%   BMI 30.99 kg/m   Awake, alert, oriented  Speech fluent, appropriate  CN grossly intact  MAEW with good strength Incision: c/d/i  CBC Latest Ref Rng & Units 02/01/2018 01/31/2018 01/30/2018  WBC 4.0 - 10.5 K/uL 8.9 8.6 -  Hemoglobin 12.0 - 15.0 g/dL 11.5(L) 10.8(L) 11.7(L)  Hematocrit 36.0 - 46.0 % 35.2(L) 32.5(L) 34.3(L)  Platelets 150 - 400 K/uL 232 181 -    PLAN Much improved this am.  Anemia: H/H up from yesterday. Reassuring. Will continue to monitor. CSW working on placement. Ready for d/c tomorrow if bed available.

## 2018-02-02 ENCOUNTER — Ambulatory Visit: Payer: Medicare HMO | Admitting: Orthopedic Surgery

## 2018-02-02 DIAGNOSIS — Z853 Personal history of malignant neoplasm of breast: Secondary | ICD-10-CM | POA: Diagnosis not present

## 2018-02-02 DIAGNOSIS — Z981 Arthrodesis status: Secondary | ICD-10-CM | POA: Diagnosis not present

## 2018-02-02 DIAGNOSIS — B029 Zoster without complications: Secondary | ICD-10-CM | POA: Diagnosis not present

## 2018-02-02 DIAGNOSIS — M4326 Fusion of spine, lumbar region: Secondary | ICD-10-CM | POA: Diagnosis not present

## 2018-02-02 DIAGNOSIS — M4316 Spondylolisthesis, lumbar region: Secondary | ICD-10-CM | POA: Diagnosis not present

## 2018-02-02 DIAGNOSIS — K5900 Constipation, unspecified: Secondary | ICD-10-CM | POA: Diagnosis not present

## 2018-02-02 DIAGNOSIS — M545 Low back pain: Secondary | ICD-10-CM | POA: Diagnosis not present

## 2018-02-02 DIAGNOSIS — R262 Difficulty in walking, not elsewhere classified: Secondary | ICD-10-CM | POA: Diagnosis not present

## 2018-02-02 DIAGNOSIS — M6281 Muscle weakness (generalized): Secondary | ICD-10-CM | POA: Diagnosis not present

## 2018-02-02 DIAGNOSIS — Z743 Need for continuous supervision: Secondary | ICD-10-CM | POA: Diagnosis not present

## 2018-02-02 DIAGNOSIS — R69 Illness, unspecified: Secondary | ICD-10-CM | POA: Diagnosis not present

## 2018-02-02 DIAGNOSIS — R279 Unspecified lack of coordination: Secondary | ICD-10-CM | POA: Diagnosis not present

## 2018-02-02 DIAGNOSIS — E119 Type 2 diabetes mellitus without complications: Secondary | ICD-10-CM | POA: Diagnosis not present

## 2018-02-02 LAB — CBC WITH DIFFERENTIAL/PLATELET
Abs Immature Granulocytes: 0.1 10*3/uL (ref 0.0–0.1)
Basophils Absolute: 0 10*3/uL (ref 0.0–0.1)
Basophils Relative: 0 %
Eosinophils Absolute: 0.3 10*3/uL (ref 0.0–0.7)
Eosinophils Relative: 3 %
HCT: 32.1 % — ABNORMAL LOW (ref 36.0–46.0)
Hemoglobin: 10.6 g/dL — ABNORMAL LOW (ref 12.0–15.0)
Immature Granulocytes: 1 %
Lymphocytes Relative: 15 %
Lymphs Abs: 1.2 10*3/uL (ref 0.7–4.0)
MCH: 30.4 pg (ref 26.0–34.0)
MCHC: 33 g/dL (ref 30.0–36.0)
MCV: 92 fL (ref 78.0–100.0)
Monocytes Absolute: 0.6 10*3/uL (ref 0.1–1.0)
Monocytes Relative: 8 %
Neutro Abs: 5.6 10*3/uL (ref 1.7–7.7)
Neutrophils Relative %: 73 %
Platelets: 233 10*3/uL (ref 150–400)
RBC: 3.49 MIL/uL — ABNORMAL LOW (ref 3.87–5.11)
RDW: 12.8 % (ref 11.5–15.5)
WBC: 7.8 10*3/uL (ref 4.0–10.5)

## 2018-02-02 LAB — GLUCOSE, CAPILLARY
Glucose-Capillary: 145 mg/dL — ABNORMAL HIGH (ref 70–99)
Glucose-Capillary: 112 mg/dL — ABNORMAL HIGH (ref 70–99)
Glucose-Capillary: 124 mg/dL — ABNORMAL HIGH (ref 70–99)

## 2018-02-02 MED ORDER — OXYCODONE HCL 10 MG PO TABS: 10.0000 mg | ORAL_TABLET | ORAL | 0 refills | Status: DC | PRN

## 2018-02-02 MED ORDER — DIAZEPAM 5 MG PO TABS: 5.0000 mg | ORAL_TABLET | Freq: Four times a day (QID) | ORAL | 0 refills | Status: DC | PRN

## 2018-02-02 NOTE — Discharge Summary (Signed)
Physician Discharge Summary  Patient ID: Amy [name] MRN: [id number] DOB/AGE: 02/16/1936 82 y.o.  Admit date: 01/26/2018 Discharge date: 02/02/2018  Admission Diagnoses:  Discharge Diagnoses:  Active Problems:   Status post surgery   Surgery, elective   Degenerative spondylolisthesis   Discharged Condition: good  Hospital Course: Patient admitted to the hospital where she underwent a thoracic lumbar sacral decompression and fusion with instrumentation.  Postoperative patient is done reasonably well.  Her perioperative course was complicated by acute blood loss anemia requiring transfusion.  Otherwise she has been neurologically intact.  She is gently mobilized with therapy and she reports good pain control and significant pain improvement from her preoperative state.  Current plan is for her to be discharged to skilled nursing facility for further convalescence.  Consults:   Significant Diagnostic Studies:   Treatments:  Discharge Exam: Blood pressure 123/87, pulse 93, temperature 97.6 F (36.4 C), temperature source Oral, resp. rate 18, height 5\' 4"  (1.626 m), weight 81.9 kg (180 lb 8.9 oz), SpO2 94 %. Patient is awake and alert.  She is oriented and appropriate.  Cranial nerve function intact.  Motor and sensory function extremities normal.  Wound clean and dry.  Chest and abdomen benign.  Disposition: Discharge disposition: 03-Skilled Nursing Facility        Allergies as of 02/02/2018      Reactions   Cymbalta [duloxetine Hcl] Other (See Comments)   Hallucinated and couldn't think; got worse and worse the longer she took this.      Medication List    TAKE these medications   acetaminophen 325 MG tablet Commonly known as:  TYLENOL Take 325-650 mg by mouth every 6 (six) hours as needed (for pain. (in between percocet doses)).   ALPRAZolam 0.5 MG tablet Commonly known as:  XANAX TAKE 1 TABLET BY MOUTH THREE TIMES A DAY IF NEEDED FOR ANXIETY   cholecalciferol 1000  units tablet Commonly known as:  VITAMIN D Take 1 tablet (1,000 Units total) by mouth daily.   denosumab 60 MG/ML Soln injection Commonly known as:  PROLIA Inject 60 mg into the skin every 6 (six) months. Administer in upper arm, thigh, or abdomen   diazepam 5 MG tablet Commonly known as:  VALIUM Take 1-2 tablets (5-10 mg total) by mouth every 6 (six) hours as needed for muscle spasms.   docusate sodium 100 MG capsule Commonly known as:  COLACE Take 100 mg by mouth as needed for mild constipation.   escitalopram 20 MG tablet Commonly known as:  LEXAPRO Take 1 tablet (20 mg total) by mouth daily.   letrozole 2.5 MG tablet Commonly known as:  FEMARA Take 1 tablet (2.5 mg total) by mouth daily.   metFORMIN 500 MG tablet Commonly known as:  GLUCOPHAGE Take 500 mg by mouth 2 (two) times daily with a meal.   ONE TOUCH ULTRA TEST test strip Generic drug:  glucose blood USE TO TEST BLOOD SUGAR ONCE DAILY   Oxycodone HCl 10 MG Tabs Take 1 tablet (10 mg total) by mouth every 3 (three) hours as needed for severe pain ((score 7 to 10)).   oxyCODONE-acetaminophen 7.5-325 MG tablet Commonly known as:  PERCOCET Take 1 tablet by mouth every 4 (four) hours as needed for moderate pain or severe pain.   OYSTER CALCIUM/D3 500-200 MG-UNIT Tabs Generic drug:  Calcium Carb-Cholecalciferol Take 2 tablets by mouth 2 (two) times daily.   risperiDONE 1 MG tablet Commonly known as:  RISPERDAL Take 1 tablet (1 mg total)  by mouth at bedtime.   traZODone 50 MG tablet Commonly known as:  DESYREL Take 1 tablet (50 mg total) by mouth at bedtime.   valACYclovir 500 MG tablet Commonly known as:  VALTREX Take 1,000 mg by mouth See admin instructions. Take 2 tablets (1000 mg) by mouth daily for 3 days as needed for outbreaks.   venlafaxine XR 150 MG 24 hr capsule Commonly known as:  EFFEXOR XR Take 1 capsule (150 mg total) by mouth 2 (two) times daily.            Durable Medical Equipment   (From admission, onward)        Start     Ordered   01/26/18 2218  DME Walker rolling  Once    Question:  Patient needs a walker to treat with the following condition  Answer:  Degenerative spondylolisthesis   01/26/18 2217   01/26/18 2218  DME 3 n 1  Once     01/26/18 2217     Contact information for after-discharge care    [facility] .   Service:  Skilled Nursing Contact information: [address] [place] [phone]              Signed: Charlie Pitter 02/02/2018, 3:53 PM

## 2018-02-02 NOTE — Clinical Social Work Placement (Signed)
Nurse to call report to [phone], Room [place]  NOTE  Date:  02/02/2018  Patient Details  Name: Amy [name] MRN: [id number] Date of Birth: Feb 07, 1936  Clinical Social Work is seeking post-discharge placement for this patient at the [place] level of care (*CSW will initial, date and re-position this form in  chart as items are completed):  Yes   Patient/family provided with [facility] Work Department's list of facilities offering this level of care within the geographic area requested by the patient (or if unable, by the patient's family).  Yes   Patient/family informed of their freedom to choose among providers that offer the needed level of care, that participate in Medicare, Medicaid or managed care program needed by the patient, have an available bed and are willing to accept the patient.  Yes   Patient/family informed of [facility]'s ownership interest in [facility] and [facility], as well as of the fact that they are under no obligation to receive care at these facilities.  PASRR submitted to EDS on 01/28/18     PASRR number received on       Existing PASRR number confirmed on       FL2 transmitted to all facilities in geographic area requested by pt/family on 01/28/18     FL2 transmitted to all facilities within larger geographic area on       Patient informed that his/her managed care company has contracts with or will negotiate with certain facilities, including the following:        Yes   Patient/family informed of bed offers received.  Patient chooses bed at [facility]     Physician recommends and patient chooses bed at      Patient to be transferred to [facility] on 02/02/18.  Patient to be transferred to facility by [facility]     Patient family notified on 02/02/18 of transfer.  Name of family member notified:  Johnathan     PHYSICIAN       Additional Comment:     _______________________________________________ Geralynn Ochs, [place] 02/02/2018, 4:30 PM

## 2018-02-02 NOTE — Consult Note (Signed)
[facility] CM Primary Care Navigator  02/02/2018  Amy [name] 1935-09-24 [id number]   Met withpatient and son Amy [name]) at the bedside to identify possible discharge needs.  Patient reports having lower back and hip pain causing difficulty walking that had led to this admission/ surgery. (Degenerative spondylolisthesis status post thoracic lumbar sacral decompression and fusion with instrumentation)  Patient confirmedDr. Velvet Bathe with [facility] as theprimary care provider.   [facility].  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  Patient verbalized that she has been driving prior to admission/ surgery. She mentioned that her son, daughter in-law, nephew or neighbor will be able to provide transportation to herdoctors'appointments after discharge.  Patient reports that she lives alone at home but her family or neighbor Amy [name]) will be able to assist her with needs at home when discharge from rehab facility.  Anticipated discharge plan is skilled nursing facility (SNF) for rehabilitation per therapy recommendation.   Patientand son voiced understanding to call primary care provider's office when shereturnsbackhome,for a post discharge follow-upvisitwithin 1- 2 weeks or sooner if needed.Patient letter (with PCP's contact number) was provided as a reminder.  Explained to patientabout [facility] CM services available for health managementand resourcesat homebut she indicated having no current needs or concerns for now and able to manage her health conditions so far.  Patient verbalizedunderstandingof needto seekreferral from primary care provider to [facility] care management ifdeemed necessary and appropriatefor anyservicesin the nearfuture- once shereturnsbackhome.   [facility] care management information  provided for future needs thatpatientmay have.   Primary care provider's office is listed as providing transition of care (TOC) follow-up.    For additional questions please contact:  Amy [name], BSN, RN-BC [facility] PRIMARY CARE Navigator Cell: [phone]

## 2018-02-02 NOTE — Progress Notes (Signed)
Physical Therapy Treatment Patient Details Name: Amy [name] MRN: [id number] DOB: 1936-01-23 Today's Date: 02/02/2018    History of Present Illness  Pt is an 82 y/o female with PMH significant for DM2, rotator cuff repair, admitted with progressively worsening back and leg pain, s/p L45, L5-S1 decompressive laminotomies and foraminotomies, L45, L5-S1 PLIF with autograft and T10-S1 PLA with alografting and fixation.    PT Comments    Patient is pleasant and agreeable to therapy. Patient is making gradual progress toward PT goals. Pt required assistance for all mobility and able to ambulate 95ft X2 with seated rest break. Continue to progress as tolerated with anticipated d/c to SNF for further skilled PT services.     Follow Up Recommendations  SNF     Equipment Recommendations  None recommended by PT    Recommendations for Other Services Rehab consult     Precautions / Restrictions Precautions Precautions: Back Precaution Comments: precautions reviewed with pt Required Braces or Orthoses: Spinal Brace Spinal Brace: Lumbar corset;Applied in sitting position Restrictions Weight Bearing Restrictions: No    Mobility  Bed Mobility Overal bed mobility: Needs Assistance Bed Mobility: Rolling;Sidelying to Sit Rolling: Min assist Sidelying to sit: Mod assist;+2 for physical assistance       General bed mobility comments: pt unable to roll onto L side due to L hip pain; cues for sequencing and assistance at bilat LE and trunk   Transfers Overall transfer level: Needs assistance Equipment used: Rolling walker (2 wheeled) Transfers: Sit to/from Stand Sit to Stand: Mod assist;Min assist         General transfer comment: assist to power up into standing and gain balance from EOB and recliner; cues for safe hand placement   Ambulation/Gait Ambulation/Gait assistance: Mod assist(chair follow) Gait Distance (Feet): (45ft X2) Assistive device: Rolling walker (2  wheeled) Gait Pattern/deviations: Step-through pattern;Decreased stride length;Wide base of support;Trunk flexed Gait velocity: decreased   General Gait Details: multimodal cues for posture; seated rest break required due to fatigue/pain; assistance for balance and guiding RW especially with turns    Stairs             Wheelchair Mobility    Modified Rankin (Stroke Patients Only)       Balance Overall balance assessment: Needs assistance Sitting-balance support: Single extremity supported;No upper extremity supported Sitting balance-Leahy Scale: Fair     Standing balance support: Bilateral upper extremity supported;During functional activity Standing balance-Leahy Scale: Poor                              Cognition Arousal/Alertness: Awake/alert Behavior During Therapy: WFL for tasks assessed/performed;Flat affect Overall Cognitive Status: No family/caregiver present to determine baseline cognitive functioning Area of Impairment: Memory;Awareness;Safety/judgement;Problem solving                     Memory: Decreased recall of precautions;Decreased short-term memory   Safety/Judgement: Decreased awareness of safety;Decreased awareness of deficits   Problem Solving: Slow processing;Difficulty sequencing;Requires verbal cues        Exercises      General Comments        Pertinent Vitals/Pain Pain Assessment: Faces Faces Pain Scale: Hurts even more Pain Location: grossly L hip Pain Descriptors / Indicators: Grimacing;Guarding;Sore Pain Intervention(s): Limited activity within patient's tolerance;Monitored during session;Premedicated before session;Repositioned    Home Living  Prior Function            PT Goals (current goals can now be found in the care plan section) Acute Rehab PT Goals PT Goal Formulation: With patient Time For Goal Achievement: 02/10/18 Potential to Achieve Goals: Good Progress  towards PT goals: Progressing toward goals    Frequency    Min 5X/week      PT Plan Current plan remains appropriate    Co-evaluation              AM-PAC PT "6 Clicks" Daily Activity  Outcome Measure  Difficulty turning over in bed (including adjusting bedclothes, sheets and blankets)?: Unable Difficulty moving from lying on back to sitting on the side of the bed? : Unable Difficulty sitting down on and standing up from a chair with arms (e.g., wheelchair, bedside commode, etc,.)?: Unable Help needed moving to and from a bed to chair (including a wheelchair)?: A Little Help needed walking in hospital room?: A Lot Help needed climbing 3-5 steps with a railing? : A Lot 6 Click Score: 10    End of Session Equipment Utilized During Treatment: Gait belt Activity Tolerance: Patient limited by pain Patient left: in chair;with call bell/phone within reach;with chair alarm set Nurse Communication: Mobility status PT Visit Diagnosis: Other abnormalities of gait and mobility (R26.89);Pain Pain - part of body: (back)     Time: 0051-1021 PT Time Calculation (min) (ACUTE ONLY): 26 min  Charges:  $Gait Training: 8-22 mins $Therapeutic Activity: 8-22 mins                    G Codes:       Earney Navy, PTA Pager: [phone]     Darliss Cheney 02/02/2018, 12:05 PM

## 2018-02-02 NOTE — Progress Notes (Signed)
Patient reports that she is doing well this morning.  She states her back pain is well controlled.  She had some cramping in her left foot with mobilization today but this is now passed.  She is not have any complaints of radiating pain numbness or weakness.  She is having no severe back pain.  Patient is afebrile.  Her vital signs are stable.  She had a brief run of supraventricular tachycardia this morning without symptoms and currently is having no symptoms of chest pain or any distress.  She is wide awake and alert.  Motor and sensory exam are intact.  Her dressing is clean and dry.  Overall progressing well.  She is mobilizing fluid well.  Continue efforts at therapy.  Patient ready for discharge to skilled nursing when bed available.

## 2018-02-02 NOTE — Progress Notes (Signed)
Attempted 2x to contact SNF with no answer.

## 2018-02-02 NOTE — Progress Notes (Signed)
Called MD Pool's office and spoke with Wells Guiles, Medical Assistant and told her that pt had run of SVT's earlier this morning. Wells Guiles took message and stated would let MD Pool know.

## 2018-02-02 NOTE — Progress Notes (Signed)
CSW following for discharge planning. CSW alerted by Admissions at [facility] that [place] authorization is still pending. Holland Falling will need updated therapy notes within the past 24 hours; paged PT earlier to put note in the chart with updates. CSW sent update to [facility]. SNF will be willing to take the patient under LOG pending authorization, if MD is ready to discharge.  CSW contacted Neurosurgery office and left voicemail with Caryl Pina for Dr. Annette Stable. CSW to follow.  Laveda Abbe, [place] Clinical Social Worker [phone]

## 2018-02-02 NOTE — Progress Notes (Signed)
RN gave report to [place] at [facility].

## 2018-02-02 NOTE — Discharge Instructions (Signed)

## 2018-02-04 DIAGNOSIS — M4326 Fusion of spine, lumbar region: Secondary | ICD-10-CM | POA: Diagnosis not present

## 2018-02-04 DIAGNOSIS — M545 Low back pain: Secondary | ICD-10-CM | POA: Diagnosis not present

## 2018-02-04 DIAGNOSIS — E119 Type 2 diabetes mellitus without complications: Secondary | ICD-10-CM | POA: Diagnosis not present

## 2018-02-04 DIAGNOSIS — M4316 Spondylolisthesis, lumbar region: Secondary | ICD-10-CM | POA: Diagnosis not present

## 2018-02-09 DIAGNOSIS — M4326 Fusion of spine, lumbar region: Secondary | ICD-10-CM | POA: Diagnosis not present

## 2018-02-09 DIAGNOSIS — K5900 Constipation, unspecified: Secondary | ICD-10-CM | POA: Diagnosis not present

## 2018-02-11 DIAGNOSIS — E119 Type 2 diabetes mellitus without complications: Secondary | ICD-10-CM | POA: Diagnosis not present

## 2018-02-11 DIAGNOSIS — R69 Illness, unspecified: Secondary | ICD-10-CM | POA: Diagnosis not present

## 2018-02-11 DIAGNOSIS — M4326 Fusion of spine, lumbar region: Secondary | ICD-10-CM | POA: Diagnosis not present

## 2018-02-18 DIAGNOSIS — M4316 Spondylolisthesis, lumbar region: Secondary | ICD-10-CM | POA: Diagnosis not present

## 2018-02-18 DIAGNOSIS — M545 Low back pain: Secondary | ICD-10-CM | POA: Diagnosis not present

## 2018-02-18 DIAGNOSIS — M4326 Fusion of spine, lumbar region: Secondary | ICD-10-CM | POA: Diagnosis not present

## 2018-02-23 DIAGNOSIS — M4316 Spondylolisthesis, lumbar region: Secondary | ICD-10-CM | POA: Diagnosis not present

## 2018-02-23 DIAGNOSIS — M545 Low back pain: Secondary | ICD-10-CM | POA: Diagnosis not present

## 2018-02-23 DIAGNOSIS — M1380 Other specified arthritis, unspecified site: Secondary | ICD-10-CM | POA: Diagnosis not present

## 2018-02-23 DIAGNOSIS — M4326 Fusion of spine, lumbar region: Secondary | ICD-10-CM | POA: Diagnosis not present

## 2018-02-26 DIAGNOSIS — M4156 Other secondary scoliosis, lumbar region: Secondary | ICD-10-CM | POA: Diagnosis not present

## 2018-02-27 DIAGNOSIS — M8580 Other specified disorders of bone density and structure, unspecified site: Secondary | ICD-10-CM | POA: Diagnosis not present

## 2018-02-27 DIAGNOSIS — M6281 Muscle weakness (generalized): Secondary | ICD-10-CM | POA: Diagnosis not present

## 2018-02-27 DIAGNOSIS — M4326 Fusion of spine, lumbar region: Secondary | ICD-10-CM | POA: Diagnosis not present

## 2018-02-28 DIAGNOSIS — M4316 Spondylolisthesis, lumbar region: Secondary | ICD-10-CM | POA: Diagnosis not present

## 2018-02-28 DIAGNOSIS — Z4789 Encounter for other orthopedic aftercare: Secondary | ICD-10-CM | POA: Diagnosis not present

## 2018-02-28 DIAGNOSIS — R69 Illness, unspecified: Secondary | ICD-10-CM | POA: Diagnosis not present

## 2018-02-28 DIAGNOSIS — R2689 Other abnormalities of gait and mobility: Secondary | ICD-10-CM | POA: Diagnosis not present

## 2018-02-28 DIAGNOSIS — E119 Type 2 diabetes mellitus without complications: Secondary | ICD-10-CM | POA: Diagnosis not present

## 2018-02-28 DIAGNOSIS — M8580 Other specified disorders of bone density and structure, unspecified site: Secondary | ICD-10-CM | POA: Diagnosis not present

## 2018-03-02 DIAGNOSIS — E119 Type 2 diabetes mellitus without complications: Secondary | ICD-10-CM | POA: Diagnosis not present

## 2018-03-02 DIAGNOSIS — M4316 Spondylolisthesis, lumbar region: Secondary | ICD-10-CM | POA: Diagnosis not present

## 2018-03-02 DIAGNOSIS — M8580 Other specified disorders of bone density and structure, unspecified site: Secondary | ICD-10-CM | POA: Diagnosis not present

## 2018-03-02 DIAGNOSIS — Z4789 Encounter for other orthopedic aftercare: Secondary | ICD-10-CM | POA: Diagnosis not present

## 2018-03-02 DIAGNOSIS — R2689 Other abnormalities of gait and mobility: Secondary | ICD-10-CM | POA: Diagnosis not present

## 2018-03-02 DIAGNOSIS — R69 Illness, unspecified: Secondary | ICD-10-CM | POA: Diagnosis not present

## 2018-03-04 DIAGNOSIS — M8580 Other specified disorders of bone density and structure, unspecified site: Secondary | ICD-10-CM | POA: Diagnosis not present

## 2018-03-04 DIAGNOSIS — Z4789 Encounter for other orthopedic aftercare: Secondary | ICD-10-CM | POA: Diagnosis not present

## 2018-03-04 DIAGNOSIS — M4316 Spondylolisthesis, lumbar region: Secondary | ICD-10-CM | POA: Diagnosis not present

## 2018-03-04 DIAGNOSIS — R2689 Other abnormalities of gait and mobility: Secondary | ICD-10-CM | POA: Diagnosis not present

## 2018-03-04 DIAGNOSIS — E119 Type 2 diabetes mellitus without complications: Secondary | ICD-10-CM | POA: Diagnosis not present

## 2018-03-04 DIAGNOSIS — R69 Illness, unspecified: Secondary | ICD-10-CM | POA: Diagnosis not present

## 2018-03-05 DIAGNOSIS — R69 Illness, unspecified: Secondary | ICD-10-CM | POA: Diagnosis not present

## 2018-03-06 DIAGNOSIS — Z4789 Encounter for other orthopedic aftercare: Secondary | ICD-10-CM | POA: Diagnosis not present

## 2018-03-06 DIAGNOSIS — M8580 Other specified disorders of bone density and structure, unspecified site: Secondary | ICD-10-CM | POA: Diagnosis not present

## 2018-03-06 DIAGNOSIS — M4316 Spondylolisthesis, lumbar region: Secondary | ICD-10-CM | POA: Diagnosis not present

## 2018-03-06 DIAGNOSIS — E119 Type 2 diabetes mellitus without complications: Secondary | ICD-10-CM | POA: Diagnosis not present

## 2018-03-06 DIAGNOSIS — R2689 Other abnormalities of gait and mobility: Secondary | ICD-10-CM | POA: Diagnosis not present

## 2018-03-06 DIAGNOSIS — R69 Illness, unspecified: Secondary | ICD-10-CM | POA: Diagnosis not present

## 2018-03-09 DIAGNOSIS — E119 Type 2 diabetes mellitus without complications: Secondary | ICD-10-CM | POA: Diagnosis not present

## 2018-03-09 DIAGNOSIS — M8580 Other specified disorders of bone density and structure, unspecified site: Secondary | ICD-10-CM | POA: Diagnosis not present

## 2018-03-09 DIAGNOSIS — Z4789 Encounter for other orthopedic aftercare: Secondary | ICD-10-CM | POA: Diagnosis not present

## 2018-03-09 DIAGNOSIS — R69 Illness, unspecified: Secondary | ICD-10-CM | POA: Diagnosis not present

## 2018-03-09 DIAGNOSIS — R2689 Other abnormalities of gait and mobility: Secondary | ICD-10-CM | POA: Diagnosis not present

## 2018-03-09 DIAGNOSIS — M4316 Spondylolisthesis, lumbar region: Secondary | ICD-10-CM | POA: Diagnosis not present

## 2018-03-10 ENCOUNTER — Ambulatory Visit (HOSPITAL_COMMUNITY): Payer: Medicare HMO | Admitting: Licensed Clinical Social Worker

## 2018-03-10 DIAGNOSIS — M4316 Spondylolisthesis, lumbar region: Secondary | ICD-10-CM | POA: Diagnosis not present

## 2018-03-10 DIAGNOSIS — R69 Illness, unspecified: Secondary | ICD-10-CM | POA: Diagnosis not present

## 2018-03-10 DIAGNOSIS — M8580 Other specified disorders of bone density and structure, unspecified site: Secondary | ICD-10-CM | POA: Diagnosis not present

## 2018-03-10 DIAGNOSIS — Z4789 Encounter for other orthopedic aftercare: Secondary | ICD-10-CM | POA: Diagnosis not present

## 2018-03-10 DIAGNOSIS — R2689 Other abnormalities of gait and mobility: Secondary | ICD-10-CM | POA: Diagnosis not present

## 2018-03-10 DIAGNOSIS — E119 Type 2 diabetes mellitus without complications: Secondary | ICD-10-CM | POA: Diagnosis not present

## 2018-03-11 DIAGNOSIS — M4316 Spondylolisthesis, lumbar region: Secondary | ICD-10-CM | POA: Diagnosis not present

## 2018-03-11 DIAGNOSIS — R2689 Other abnormalities of gait and mobility: Secondary | ICD-10-CM | POA: Diagnosis not present

## 2018-03-11 DIAGNOSIS — M8580 Other specified disorders of bone density and structure, unspecified site: Secondary | ICD-10-CM | POA: Diagnosis not present

## 2018-03-11 DIAGNOSIS — R69 Illness, unspecified: Secondary | ICD-10-CM | POA: Diagnosis not present

## 2018-03-11 DIAGNOSIS — E119 Type 2 diabetes mellitus without complications: Secondary | ICD-10-CM | POA: Diagnosis not present

## 2018-03-11 DIAGNOSIS — Z4789 Encounter for other orthopedic aftercare: Secondary | ICD-10-CM | POA: Diagnosis not present

## 2018-03-13 DIAGNOSIS — R2689 Other abnormalities of gait and mobility: Secondary | ICD-10-CM | POA: Diagnosis not present

## 2018-03-13 DIAGNOSIS — M8580 Other specified disorders of bone density and structure, unspecified site: Secondary | ICD-10-CM | POA: Diagnosis not present

## 2018-03-13 DIAGNOSIS — R69 Illness, unspecified: Secondary | ICD-10-CM | POA: Diagnosis not present

## 2018-03-13 DIAGNOSIS — M4316 Spondylolisthesis, lumbar region: Secondary | ICD-10-CM | POA: Diagnosis not present

## 2018-03-13 DIAGNOSIS — E119 Type 2 diabetes mellitus without complications: Secondary | ICD-10-CM | POA: Diagnosis not present

## 2018-03-13 DIAGNOSIS — Z4789 Encounter for other orthopedic aftercare: Secondary | ICD-10-CM | POA: Diagnosis not present

## 2018-03-16 DIAGNOSIS — R2689 Other abnormalities of gait and mobility: Secondary | ICD-10-CM | POA: Diagnosis not present

## 2018-03-16 DIAGNOSIS — M4316 Spondylolisthesis, lumbar region: Secondary | ICD-10-CM | POA: Diagnosis not present

## 2018-03-16 DIAGNOSIS — E119 Type 2 diabetes mellitus without complications: Secondary | ICD-10-CM | POA: Diagnosis not present

## 2018-03-16 DIAGNOSIS — M8580 Other specified disorders of bone density and structure, unspecified site: Secondary | ICD-10-CM | POA: Diagnosis not present

## 2018-03-16 DIAGNOSIS — R69 Illness, unspecified: Secondary | ICD-10-CM | POA: Diagnosis not present

## 2018-03-16 DIAGNOSIS — Z4789 Encounter for other orthopedic aftercare: Secondary | ICD-10-CM | POA: Diagnosis not present

## 2018-03-17 DIAGNOSIS — R69 Illness, unspecified: Secondary | ICD-10-CM | POA: Diagnosis not present

## 2018-03-17 DIAGNOSIS — E119 Type 2 diabetes mellitus without complications: Secondary | ICD-10-CM | POA: Diagnosis not present

## 2018-03-17 DIAGNOSIS — I10 Essential (primary) hypertension: Secondary | ICD-10-CM | POA: Diagnosis not present

## 2018-03-17 DIAGNOSIS — M545 Low back pain: Secondary | ICD-10-CM | POA: Diagnosis not present

## 2018-03-18 DIAGNOSIS — R69 Illness, unspecified: Secondary | ICD-10-CM | POA: Diagnosis not present

## 2018-03-18 DIAGNOSIS — Z4789 Encounter for other orthopedic aftercare: Secondary | ICD-10-CM | POA: Diagnosis not present

## 2018-03-18 DIAGNOSIS — M4316 Spondylolisthesis, lumbar region: Secondary | ICD-10-CM | POA: Diagnosis not present

## 2018-03-18 DIAGNOSIS — E119 Type 2 diabetes mellitus without complications: Secondary | ICD-10-CM | POA: Diagnosis not present

## 2018-03-18 DIAGNOSIS — M8580 Other specified disorders of bone density and structure, unspecified site: Secondary | ICD-10-CM | POA: Diagnosis not present

## 2018-03-18 DIAGNOSIS — R2689 Other abnormalities of gait and mobility: Secondary | ICD-10-CM | POA: Diagnosis not present

## 2018-03-25 DIAGNOSIS — R2689 Other abnormalities of gait and mobility: Secondary | ICD-10-CM | POA: Diagnosis not present

## 2018-03-25 DIAGNOSIS — R69 Illness, unspecified: Secondary | ICD-10-CM | POA: Diagnosis not present

## 2018-03-25 DIAGNOSIS — M8580 Other specified disorders of bone density and structure, unspecified site: Secondary | ICD-10-CM | POA: Diagnosis not present

## 2018-03-25 DIAGNOSIS — Z4789 Encounter for other orthopedic aftercare: Secondary | ICD-10-CM | POA: Diagnosis not present

## 2018-03-25 DIAGNOSIS — M4316 Spondylolisthesis, lumbar region: Secondary | ICD-10-CM | POA: Diagnosis not present

## 2018-03-25 DIAGNOSIS — E119 Type 2 diabetes mellitus without complications: Secondary | ICD-10-CM | POA: Diagnosis not present

## 2018-03-27 DIAGNOSIS — Z4789 Encounter for other orthopedic aftercare: Secondary | ICD-10-CM | POA: Diagnosis not present

## 2018-03-27 DIAGNOSIS — R2689 Other abnormalities of gait and mobility: Secondary | ICD-10-CM | POA: Diagnosis not present

## 2018-03-27 DIAGNOSIS — E119 Type 2 diabetes mellitus without complications: Secondary | ICD-10-CM | POA: Diagnosis not present

## 2018-03-27 DIAGNOSIS — M4316 Spondylolisthesis, lumbar region: Secondary | ICD-10-CM | POA: Diagnosis not present

## 2018-03-27 DIAGNOSIS — R69 Illness, unspecified: Secondary | ICD-10-CM | POA: Diagnosis not present

## 2018-03-27 DIAGNOSIS — M8580 Other specified disorders of bone density and structure, unspecified site: Secondary | ICD-10-CM | POA: Diagnosis not present

## 2018-03-30 DIAGNOSIS — M4326 Fusion of spine, lumbar region: Secondary | ICD-10-CM | POA: Diagnosis not present

## 2018-03-30 DIAGNOSIS — M8580 Other specified disorders of bone density and structure, unspecified site: Secondary | ICD-10-CM | POA: Diagnosis not present

## 2018-03-30 DIAGNOSIS — M6281 Muscle weakness (generalized): Secondary | ICD-10-CM | POA: Diagnosis not present

## 2018-03-31 DIAGNOSIS — M4316 Spondylolisthesis, lumbar region: Secondary | ICD-10-CM | POA: Diagnosis not present

## 2018-03-31 DIAGNOSIS — Z4789 Encounter for other orthopedic aftercare: Secondary | ICD-10-CM | POA: Diagnosis not present

## 2018-03-31 DIAGNOSIS — E119 Type 2 diabetes mellitus without complications: Secondary | ICD-10-CM | POA: Diagnosis not present

## 2018-03-31 DIAGNOSIS — M8580 Other specified disorders of bone density and structure, unspecified site: Secondary | ICD-10-CM | POA: Diagnosis not present

## 2018-03-31 DIAGNOSIS — R69 Illness, unspecified: Secondary | ICD-10-CM | POA: Diagnosis not present

## 2018-03-31 DIAGNOSIS — R2689 Other abnormalities of gait and mobility: Secondary | ICD-10-CM | POA: Diagnosis not present

## 2018-04-02 ENCOUNTER — Other Ambulatory Visit (HOSPITAL_COMMUNITY): Payer: Self-pay | Admitting: Pulmonary Disease

## 2018-04-02 ENCOUNTER — Ambulatory Visit (HOSPITAL_COMMUNITY): Payer: Self-pay | Admitting: Psychiatry

## 2018-04-02 ENCOUNTER — Ambulatory Visit (HOSPITAL_COMMUNITY)
Admission: RE | Admit: 2018-04-02 | Discharge: 2018-04-02 | Disposition: A | Discharge status: Home / Self Care | Payer: Medicare HMO | Source: Ambulatory Visit | Attending: Vascular Surgery | Admitting: Vascular Surgery

## 2018-04-02 DIAGNOSIS — M4156 Other secondary scoliosis, lumbar region: Secondary | ICD-10-CM | POA: Diagnosis not present

## 2018-04-02 DIAGNOSIS — M79662 Pain in left lower leg: Secondary | ICD-10-CM

## 2018-04-02 DIAGNOSIS — M79605 Pain in left leg: Secondary | ICD-10-CM | POA: Insufficient documentation

## 2018-04-10 ENCOUNTER — Other Ambulatory Visit (HOSPITAL_COMMUNITY): Payer: Self-pay | Admitting: Psychiatry

## 2018-04-14 ENCOUNTER — Encounter (HOSPITAL_COMMUNITY): Payer: Self-pay | Admitting: Psychiatry

## 2018-04-14 ENCOUNTER — Ambulatory Visit (HOSPITAL_COMMUNITY): Payer: Medicare HMO | Admitting: Psychiatry

## 2018-04-14 VITALS — BP 146/88 | HR 102 | Ht 64.0 in | Wt 158.0 lb

## 2018-04-14 DIAGNOSIS — F330 Major depressive disorder, recurrent, mild: Secondary | ICD-10-CM | POA: Diagnosis not present

## 2018-04-14 DIAGNOSIS — Z87891 Personal history of nicotine dependence: Secondary | ICD-10-CM

## 2018-04-14 DIAGNOSIS — R69 Illness, unspecified: Secondary | ICD-10-CM | POA: Diagnosis not present

## 2018-04-14 MED ORDER — TRAZODONE HCL 50 MG PO TABS: 50.0000 mg | ORAL_TABLET | Freq: Every day | ORAL | 2 refills | Status: DC

## 2018-04-14 MED ORDER — ESCITALOPRAM OXALATE 20 MG PO TABS: 20.0000 mg | ORAL_TABLET | Freq: Every day | ORAL | 2 refills | Status: DC

## 2018-04-14 MED ORDER — RISPERIDONE 1 MG PO TABS: 1.0000 mg | ORAL_TABLET | Freq: Every day | ORAL | 2 refills | Status: DC

## 2018-04-14 MED ORDER — ALPRAZOLAM 0.5 MG PO TABS: 0.5000 mg | ORAL_TABLET | Freq: Every day | ORAL | 2 refills | Status: DC | PRN
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
## 2018-04-22 ENCOUNTER — Ambulatory Visit: Payer: Medicare HMO | Admitting: Orthopedic Surgery

## 2018-04-22 ENCOUNTER — Encounter: Payer: Self-pay | Admitting: Orthopedic Surgery

## 2018-04-22 ENCOUNTER — Ambulatory Visit (INDEPENDENT_AMBULATORY_CARE_PROVIDER_SITE_OTHER): Payer: Medicare HMO

## 2018-04-22 ENCOUNTER — Telehealth: Payer: Self-pay | Admitting: Radiology

## 2018-04-22 VITALS — BP 123/70 | HR 90 | Ht 64.0 in | Wt 157.0 lb

## 2018-04-22 DIAGNOSIS — M1711 Unilateral primary osteoarthritis, right knee: Secondary | ICD-10-CM

## 2018-04-22 DIAGNOSIS — M48061 Spinal stenosis, lumbar region without neurogenic claudication: Secondary | ICD-10-CM | POA: Diagnosis not present

## 2018-04-22 NOTE — Progress Notes (Signed)
Progress Note   Patient ID: Amy [name], female   DOB: Aug 25, 1935, 82 y.o.   MRN: [id number]   Chief Complaint  Patient presents with  . Knee Pain    HPI The patient presents for evaluation of HER RIGHT KNEE  SHE IS S/P PLIF ON January 26, 2018.  Location RIGHT KNEE  Duration 1 YEAR OR MORE  Quality DULL  Severity 1-10/10: 8 Associated with PAIN WHEN WEIGHT BEARING *  Review of Systems  Musculoskeletal:       She says she hurts all over her back still hurts  All other systems reviewed and are negative.      Current Meds  Medication Sig  . acetaminophen (TYLENOL) 325 MG tablet Take 325-650 mg by mouth every 6 (six) hours as needed (for pain. (in between percocet doses)).  Marland Kitchen ALPRAZolam (XANAX) 0.5 MG tablet Take 1 tablet (0.5 mg total) by mouth daily as needed for anxiety.  . Calcium Carb-Cholecalciferol (OYSTER CALCIUM/D3) 500-200 MG-UNIT TABS Take 2 tablets by mouth 2 (two) times daily.   . cholecalciferol (VITAMIN D) 1000 units tablet Take 1 tablet (1,000 Units total) by mouth daily.  Marland Kitchen denosumab (PROLIA) 60 MG/ML SOLN injection Inject 60 mg into the skin every 6 (six) months. Administer in upper arm, thigh, or abdomen  . docusate sodium (COLACE) 100 MG capsule Take 100 mg by mouth as needed for mild constipation.  Marland Kitchen escitalopram (LEXAPRO) 20 MG tablet Take 1 tablet (20 mg total) by mouth daily.  Marland Kitchen gabapentin (NEURONTIN) 300 MG capsule TK ONE C PO QHS  . letrozole (FEMARA) 2.5 MG tablet Take 1 tablet (2.5 mg total) by mouth daily.  . metFORMIN (GLUCOPHAGE) 500 MG tablet Take 500 mg by mouth 2 (two) times daily with a meal.   . methocarbamol (ROBAXIN) 500 MG tablet TK 1 T PO QID PRN  . ONE TOUCH ULTRA TEST test strip USE TO TEST BLOOD SUGAR ONCE DAILY  . oxyCODONE-acetaminophen (PERCOCET) 7.5-325 MG tablet Take 1 tablet by mouth every 4 (four) hours as needed for moderate pain or severe pain.   Marland Kitchen risperiDONE (RISPERDAL) 1 MG tablet Take 1 tablet (1 mg total) by mouth at  bedtime.  . traZODone (DESYREL) 50 MG tablet Take 1 tablet (50 mg total) by mouth at bedtime.  . valACYclovir (VALTREX) 500 MG tablet Take 1,000 mg by mouth See admin instructions. Take 2 tablets (1000 mg) by mouth daily for 3 days as needed for outbreaks.  Marland Kitchen venlafaxine XR (EFFEXOR XR) 150 MG 24 hr capsule Take 1 capsule (150 mg total) by mouth 2 (two) times daily.    Past Medical History:  Diagnosis Date  . Anxiety   . Arthritis   . Back pain   . Colon polyps   . Depression   . Diabetes mellitus, type II ([place])    diet controlled  . Hypercholesterolemia   . Invasive ductal carcinoma of breast, female, right ([place])   . Osteopenia 11/26/2016  . Skin-picking disorder    has 3 open wounds on right wrist that are being treated. About a quarter in size.     Allergies  Allergen Reactions  . Cymbalta [Duloxetine Hcl] Other (See Comments)    Hallucinated and couldn't think; got worse and worse the longer she took this.     BP 123/70   Pulse 90   Ht 5\' 4"  (1.626 m)   Wt 157 lb (71.2 kg)   BMI 26.95 kg/m    Physical Exam General appearance normal  Oriented x3 normal Mood pleasant affect normal Gait when she walks she has poor posture with flexion of the lumbar-pelvic junction and slow cadence and flexed knee and short stride length     Ortho Exam Left knee and lower Extremity Inspection and palpation revealed no abnormalities Range of motion is full No instability was detected on stress testing Muscle tone and strength was normal without tremor Skin was warm dry and intact Good pulse and temperature were noted in the extremity Sensation revealed no abnormalities to light touch   Right knee: She still has excellent range of motion she is tender to palpation along the lateral compartment she has crepitance on range of motion her strength is still intact she has good stability to the knee no swelling neurovascular exam is intact no atrophy is noted     MEDICAL DECISION  MAKING   Imaging:  X-ray today compared to last year shows progressive arthritis in the lateral compartment and more severe valgus alignment see report   Encounter Diagnoses  Name Primary?  . Primary osteoarthritis of right knee Yes  . Degenerative lumbar spinal stenosis, status post posterior lumbar interbody fusion 2019      PLAN: (RX., injection, surgery,frx,mri/ct, XR 2 body ares) We need to get her A1c from Dr. Luan Pulling but basically she has end-stage arthritis valgus of 13 degrees right knee pain and difficulty walking and she would like to proceed with surgery she will get back to [place] on the date  No orders of the defined types were placed in this encounter.  3:31 PM 04/22/2018

## 2018-04-22 NOTE — Telephone Encounter (Signed)
A1C is 5.7 she is posted for total knee replacement on 05/05/18

## 2018-04-22 NOTE — Patient Instructions (Signed)
Preparing for Knee Replacement Getting prepared before knee replacement surgery can make your recovery easier and more comfortable. This document provides some tips and guidelines that will help you prepare for your surgery. You can ease any concerns about your financial responsibilities by calling your insurance company after you decide to have surgery. In addition to asking about your surgery and hospital stay, you will want to ask about coverage for medical equipment, rehabilitation facilities, and home care. How should I arrange for help? You will be stronger and more mobile every day. However, in the first couple of weeks after surgery, it is unlikely that you will be able to do all your daily activities as easily as you did before your surgery. You may tire easily and will still have limited movement in your leg. Follow these guidelines to best arrange for the help you may need after your surgery:  Plan to have someone take you home after the procedure. Your health care provider will tell you how many days you can expect to be in the hospital.  Cancel all work, caregiving, and volunteer responsibilities for at least 4-6 weeks after your surgery.  If you live alone, arrange for someone to take care of your home and pets for the first 4-6 weeks after surgery.  Plan to have someone stay with you day and night for the first week. This person should be someone you are comfortable with. You may need this person to help you with your exercises and personal care, such as bathing and using the toilet.  Arrange for drivers to take you to and from your follow-up appointments, the grocery store, and other places you may need to go for at least 4-6 weeks.  How should I prepare my home?  Pick a recovery spot, but do not plan on recovering in bed. Sitting upright is better for your health. You may want to use a recliner with a small table nearby. Place the items you use most frequently on that table. These  may include the TV remote, a cordless phone, a cell phone, a book or laptop computer, a water glass, and any other items of your choice.  Remove all clutter from your floors. Also remove any throw rugs.  To see if you will be able to move in your home with a walker, hold your hands out about 6 inches (15 cm) from your sides. Walk from your recovery spot to your kitchen and bathroom. Then walk from your bed to the bathroom. If you do not hit anything with your hands, you'll know that you have enough room for a walker.  Move the items that you use most often from your kitchen, bathroom, and bedroom to shelves and drawers that are at countertop height.  Prepare a few meals to freeze and reheat later.  Consider adding grab bars in the shower and near the toilet.  While you are in the hospital, you will learn about equipment that can be helpful for your recovery. Equipment may include raised toilet seats, tub benches, and shower benches. How should I prepare my body?  Have a preoperative exam. ? During the exam, your health care provider will make sure that your body is healthy enough to safely have this surgery. ? To your exam, take a complete list of all your medicines and supplements, including herbs and vitamins. ? You may need to have additional tests to ensure your safety.  Have elective dental care and routine cleanings completed before your surgery. Germs   from anywhere in your body, including your mouth, can travel to your new joint and infect it. It is important that you do not have any dental work performed for at least 3 months after your surgery. After surgery, be sure to tell your dentist about your joint replacement.  Maintain a healthy diet. Do not change your diet before surgery unless your health care provider advised you to do that.  Do not use any products that contain nicotine or tobacco, such as cigarettes or e-cigarettes. Tobacco and nicotine can delay bone healing. If you  need help quitting, ask your health care provider.  The day before your surgery, follow instructions from your health care provider for showering, eating, drinking, and taking medicines. These directions are for your safety. What kinds of exercises should I do? Your health care provider may have you do the following exercises before your surgery. Be sure to follow the exercise program only as directed by your health care provider. You should not feel pain while performing these exercises. Ankle pumps ( Dorsiflexion/plantar flexion) 1. Sit on a firm surface with your __________ leg straight out in front of you. Do not rest your foot on anything. 2. Move your foot at the ankle joint to tilt the top of your foot toward your shin. 3. Hold this position for __________ seconds. 4. Reverse the motion by tilting the top of your foot away from your shin. 5. Hold this position for __________ seconds. Repeat __________ times, then do the exercise with your other leg. Complete this exercise __________ times a day. Heel slides ( Knee flexion) 1. Lie on your back with both knees straight. (If this causes back discomfort, bend your healthy knee so your foot is flat on the floor. Keep this leg in this position while doing heel slides with the other leg.) 2. Slowly slide your __________ heel back toward your buttocks until you feel a gentle stretch in the front of your knee or thigh. 3. Hold this position for __________ seconds. 4. Slowly slide your __________ heel to the starting position. Repeat __________ times, then do the exercise with your other leg. Complete this exercise __________ times a day. Quadriceps, isometric 1. Lie on your back with your __________ leg extended and your other knee bent. 2. Gradually tighten the muscles in the front of your __________ thigh. This motion will push the back of your knee down toward the surface that is under it. 3. For __________ seconds, keep the muscles as tight  as you can without causing or increasing pain. 4. Relax the muscles slowly and completely after each repetition. Repeat __________ times, then do the exercise with your other leg. Complete this exercise __________ times a day. Short arc kicks 1. Lie on your back. Place a rolled towel (about 4-6 inches [10-15 cm] in diameter) under one knee so the knee is slightly bent. 2. Tighten the muscles in the front of your __________ thigh to raise only the lower part of your slightly bent leg off the floor. Do not allow your thigh to rise. 3. Hold this position for __________ seconds. Repeat __________ times, then do the exercise with your other leg. Complete this exercise __________ times a day. Straight leg raises - quadriceps 1. Lie on your back with your __________ leg extended and your other knee bent. 2. Tighten the muscles in the front of your __________ thigh. Your thigh may shake slightly. 3. Tighten these muscles even more to raise your leg 4-6 inches (10-15 cm)   off the floor. 4. Hold this position for __________ seconds. 5. Keep these muscles tight as you lower your leg. 6. Relax the muscles slowly and completely after each repetition. Repeat __________ times, then do the exercise with your other leg. Complete this exercise __________ times a day. Hamstring curls, prone If told by your health care provider, do this exercise while wearing ankle weights. Begin with __________ weights. Then increase the weight by 1 lb (0.5 kg) increments. Do not wear ankle weights that are more than __________. 1. Lie on your abdomen with your legs straight. [place] your __________ knee as far as you can comfortably do that. Keep your hips flat against the surface that is under them. When you bend your knee, bring your foot straight toward your buttock. Do not let it fall in or out. 3. Hold this position for __________ seconds. 4. Slowly lower your leg to the starting position.  Repeat __________ times, then do  the exercise with your other leg. Complete this exercise __________ times a day. Armchair push-ups 1. Find a firm, non-wheeled chair that has solid armrests. 2. Sitting in the chair, extend your __________ leg straight out in front of you. 3. Use your arms and your other leg to lift your body weight off of the seat of the chair. 4. Slowly lower your body weight to the seat. Repeat __________ times, then do the exercise with your other leg. Complete this exercise __________ times a day. This information is not intended to replace advice given to you by your health care provider. Make sure you discuss any questions you have with your health care provider. Document Released: 10/19/2010 Document Revised: 08/17/2016 Document Reviewed: 10/07/2013 Elsevier Interactive Patient Education  2018 [employer].  Total Knee Replacement Total knee replacement is a surgery to replace your knee joint with a man-made (prosthetic) joint. The man-made joint is called a prosthesis. It replaces parts of the thigh bone (femur), lower leg bone (tibia), and kneecap (patella). This surgery is done to lessen pain and improve knee movement. What happens before the procedure?  Ask your doctor about: ? Changing or stopping your normal medicines. This is especially important if you take diabetes medicines or blood thinners. ? Taking medicines such as aspirin and ibuprofen. These medicines can thin your blood. Do not take these medicines before your procedure if your doctor tells you not to.  Get all dental care that you need done before your procedure. Plan to not have dental work done for 3 months after your surgery.  Follow instructions from your doctor about what you cannot eat or drink.  Ask your doctor how your surgical site will be marked or identified.  You may be given antibiotic medicine to help prevent infection.  If your doctor prescribes physical therapy, do exercises as told.  Do not use any tobacco  products, such as cigarettes, chewing tobacco, or e-cigarettes. If you need help quitting, ask your doctor.  You may have a physical exam.  You may have tests, such as: ? X-rays. ? MRI. ? CT scan. ? Bone scans.  You may have a blood or urine sample taken.  Plan to have someone take you home after the procedure.  If you will be going home right after the procedure, plan to have someone with you for at least 24 hours. It is best to have someone help care for you for at least 4-6 weeks after surgery. What happens during the procedure?  To reduce your risk of  infection: ? Your health care team will wash or sanitize their hands. ? Your skin will be washed with soap.  An IV tube will be put into one of your veins.  You will be given one or more of the following: ? Sedative. This is a medicine that makes you relaxed. ? Local anesthetic. This is a medicine to numb the area. ? General anesthetic. This is a medicine that makes you fall asleep. ? Spinal anesthetic. This is a medicine that numbs your body below the waist. ? Regional anesthetic. This is a medicine that numbs everything below the injection site.  A cut (incision) will be made in your knee.  Damaged parts of your thigh bone, lower leg bone, and kneecap will be removed.  A piece of metal (liner) will be placed on your thigh bone. Pieces of plastic will be placed on your lower leg bone and the underside of your kneecap.  One or more small tubes (drains) may be placed near your cut to help drain fluid.  Your cut will be closed with stitches (sutures), skin glue, or skin tape (adhesive) strips. Medicine may be put on your cut.  A bandage (dressing) will be placed over your cut. The procedure may vary among doctors and hospitals. What happens after the procedure?  Your blood pressure, heart rate, breathing rate, and blood oxygen level will be monitored often until the medicines you were given have worn off.  You may  continue to get fluids and medicines through an IV tube.  You will have some pain. There will be medicines to help you.  You may have fluid coming from a drain.  You may have to wear special socks (compression stockings). These help to prevent blood clots and reduce swelling in your legs.  You will be told to move around as much as possible.  You may be given a continuous passive motion machine to use at home. You will be shown how to use this machine.  Do not drive for 24 hours if you received a sedative. This information is not intended to replace advice given to you by your health care provider. Make sure you discuss any questions you have with your health care provider. Document Released: 10/07/2011 Document Revised: 03/18/2016 Document Reviewed: 06/21/2015 Elsevier Interactive Patient Education  2017 [employer].

## 2018-04-22 NOTE — Addendum Note (Signed)
Addended byCandice Camp on: 04/22/2018 03:39 PM   Modules accepted: Orders, SmartSet

## 2018-04-22 NOTE — Telephone Encounter (Signed)
I called Dr Luan Pulling office they are faxing the most recent A1C to [place] on this patient.  She had it done in August

## 2018-04-23 ENCOUNTER — Telehealth: Payer: Self-pay | Admitting: Radiology

## 2018-04-23 DIAGNOSIS — M1711 Unilateral primary osteoarthritis, right knee: Secondary | ICD-10-CM

## 2018-04-23 DIAGNOSIS — M545 Low back pain: Secondary | ICD-10-CM | POA: Diagnosis not present

## 2018-04-23 DIAGNOSIS — R69 Illness, unspecified: Secondary | ICD-10-CM | POA: Diagnosis not present

## 2018-04-23 DIAGNOSIS — M25561 Pain in right knee: Secondary | ICD-10-CM | POA: Diagnosis not present

## 2018-04-23 DIAGNOSIS — Z23 Encounter for immunization: Secondary | ICD-10-CM | POA: Diagnosis not present

## 2018-04-23 DIAGNOSIS — I10 Essential (primary) hypertension: Secondary | ICD-10-CM | POA: Diagnosis not present

## 2018-04-23 NOTE — Telephone Encounter (Signed)
[id number] is the approval number from [facility] for CPT [phone] TKR right  have put in the referral for documentation

## 2018-04-23 NOTE — Addendum Note (Signed)
Addended byCandice Camp on: 04/23/2018 11:42 AM   Modules accepted: Orders

## 2018-04-25 DIAGNOSIS — R69 Illness, unspecified: Secondary | ICD-10-CM | POA: Diagnosis not present

## 2018-04-29 DIAGNOSIS — M4156 Other secondary scoliosis, lumbar region: Secondary | ICD-10-CM | POA: Diagnosis not present

## 2018-04-29 DIAGNOSIS — M6281 Muscle weakness (generalized): Secondary | ICD-10-CM | POA: Diagnosis not present

## 2018-04-29 DIAGNOSIS — M8580 Other specified disorders of bone density and structure, unspecified site: Secondary | ICD-10-CM | POA: Diagnosis not present

## 2018-04-29 DIAGNOSIS — M4326 Fusion of spine, lumbar region: Secondary | ICD-10-CM | POA: Diagnosis not present

## 2018-04-29 NOTE — Patient Instructions (Signed)
Amy [name]  04/29/2018     @PREFPERIOPPHARMACY @   Your procedure is scheduled on 05/05/2018.  Report to [facility] at 9:15 A.M.  Call this number if you have problems the morning of surgery:  [phone]   Remember:  Do not eat or drink after midnight.      Take these medicines the morning of surgery with A SIP OF WATER Xanax, Lexapro, gabapentin, Robaxin, Oxycodone 10 mg OR Percocet if needed  Risperdal, Effexor    Do not wear jewelry, make-up or nail polish.  Do not wear lotions, powders, or perfumes, or deodorant.  Do not shave 48 hours prior to surgery.  Men may shave face and neck.  Do not bring valuables to the hospital.  [facility] is not responsible for any belongings or valuables.  Contacts, dentures or bridgework may not be worn into surgery.  Leave your suitcase in the car.  After surgery it may be brought to your room.  For patients admitted to the hospital, discharge time will be determined by your treatment team.  Patients discharged the day of surgery will not be allowed to drive home.    Please read over the following fact sheets that you were given. Surgical Site Infection Prevention and Anesthesia Post-op Instructions        PATIENT INSTRUCTIONS POST-ANESTHESIA  IMMEDIATELY FOLLOWING SURGERY:  Do not drive or operate machinery for the first twenty four hours after surgery.  Do not make any important decisions for twenty four hours after surgery or while taking narcotic pain medications or sedatives.  If you develop intractable nausea and vomiting or a severe headache please notify your doctor immediately.  FOLLOW-UP:  Please make an appointment with your surgeon as instructed. You do not need to follow up with anesthesia unless specifically instructed to do so.  WOUND CARE INSTRUCTIONS (if applicable):  Keep a dry clean dressing on the anesthesia/puncture wound site if there is drainage.  Once the wound has quit draining you may leave it open  to air.  Generally you should leave the bandage intact for twenty four hours unless there is drainage.  If the epidural site drains for more than 36-48 hours please call the anesthesia department.  QUESTIONS?:  Please feel free to call your physician or the hospital operator if you have any questions, and they will be happy to assist you.      Total Knee Replacement Total knee replacement is a procedure to replace the knee joint with an artificial (prosthetic) knee joint. The purpose of this surgery is to reduce knee pain and improve knee function. The prosthetic knee joint (prosthesis) may be made of metal, plastic or ceramic. It replaces parts of the thigh bone (femur), lower leg bone (tibia), and kneecap (patella) that are removed during the procedure. Tell a health care provider about:  Any allergies you have.  All medicines you are taking, including vitamins, herbs, eye drops, creams, and over-the-counter medicines.  Any problems you or family members have had with anesthetic medicines.  Any blood disorders you have.  Any surgeries you have had.  Any medical conditions you have.  Whether you are pregnant or may be pregnant. What are the risks? Generally, this is a safe procedure. However, problems may occur, including:  Infection.  Bleeding.  Allergic reactions to medicines.  Damage to other structures or organs.  Decreased range of motion of the knee.  Instability of the knee.  Loosening of the prosthetic joint.  Knee pain  that does not go away (chronic pain).  What happens before the procedure?  Ask your health care provider about: ? Changing or stopping your regular medicines. This is especially important if you are taking diabetes medicines or blood thinners. ? Taking medicines such as aspirin and ibuprofen. These medicines can thin your blood. Do not take these medicines before your procedure if your health care provider instructs you not to.  Have dental  care and routine cleanings completed before your procedure. Plan to not have dental work done for 3 months after your procedure. Germs from anywhere in your body, including your mouth, can travel to your new joint and infect it.  Follow instructions from your health care provider about eating or drinking restrictions.  Ask your health care provider how your surgical site will be marked or identified.  You may be given antibiotic medicine to help prevent infection.  If your health care provider prescribes physical therapy, do exercises as instructed.  Do not use any tobacco products, such as cigarettes, chewing tobacco, or e-cigarettes. If you need help quitting, ask your health care provider.  You may have a physical exam.  You may have tests, such as: ? X-rays. ? MRI. ? CT scan. ? Bone scans.  You may have a blood or urine sample taken.  Plan to have someone take you home after the procedure.  If you will be going home right after the procedure, plan to have someone with you for at least 24 hours. It is recommended that you have someone to help care for you for at least 4-6 weeks after your procedure. What happens during the procedure?  To reduce your risk of infection: ? Your health care team will wash or sanitize their hands. ? Your skin will be washed with soap.  An IV tube will be inserted into one of your veins.  You will be given one or more of the following: ? A medicine to help you relax (sedative). ? A medicine to numb the area (local anesthetic). ? A medicine to make you fall asleep (general anesthetic). ? A medicine that is injected into your spine to numb the area below and slightly above the injection site (spinal anesthetic). ? A medicine that is injected into an area of your body to numb everything below the injection site (regional anesthetic).  An incision will be made in your knee.  Damaged cartilage and bone will be removed from your femur, tibia, and  patella.  Parts of the prosthesis (liners) will be placed over the areas of bone and cartilage that were removed. A metal liner will be placed over your femur, and plastic liners will be placed over your tibia and the underside of your patella.  One or more small tubes (drains) may be placed near your incision to help drain extra fluid from your surgical site.  Your incision will be closed with stitches (sutures), skin glue, or adhesive strips. Medicine may be applied to your incision.  A bandage (dressing) will be placed over your incision. The procedure may vary among health care providers and hospitals. What happens after the procedure?  Your blood pressure, heart rate, breathing rate, and blood oxygen level will be monitored often until the medicines you were given have worn off.  You may continue to receive fluids and medicines through an IV tube.  You will have some pain. Pain medicines will be available to help you.  You may have fluid coming from one or more drains in  your incision.  You may have to wear compression stockings. These stockings help to prevent blood clots and reduce swelling in your legs.  You will be encouraged to move around as much as possible.  You may be given a continuous passive motion machine to use at home. You will be shown how to use this machine.  Do not drive for 24 hours if you received a sedative. This information is not intended to replace advice given to you by your health care provider. Make sure you discuss any questions you have with your health care provider. Document Released: 10/21/2000 Document Revised: 08/17/2016 Document Reviewed: 06/21/2015 Elsevier Interactive Patient Education  [employer].

## 2018-05-01 ENCOUNTER — Other Ambulatory Visit: Payer: Self-pay

## 2018-05-01 ENCOUNTER — Encounter (HOSPITAL_COMMUNITY)
Admission: RE | Admit: 2018-05-01 | Discharge: 2018-05-01 | Disposition: A | Discharge status: Home / Self Care | Payer: Medicare HMO | Source: Ambulatory Visit | Attending: Orthopedic Surgery | Admitting: Orthopedic Surgery

## 2018-05-01 DIAGNOSIS — Z01812 Encounter for preprocedural laboratory examination: Secondary | ICD-10-CM | POA: Insufficient documentation

## 2018-05-01 LAB — SURGICAL PCR SCREEN
MRSA, PCR: NEGATIVE
Staphylococcus aureus: NEGATIVE

## 2018-05-01 LAB — CBC WITH DIFFERENTIAL/PLATELET
Basophils Absolute: 0 10*3/uL (ref 0.0–0.1)
Basophils Relative: 0 %
Eosinophils Absolute: 0.2 10*3/uL (ref 0.0–0.7)
Eosinophils Relative: 2 %
HCT: 42 % (ref 36.0–46.0)
Hemoglobin: 13.7 g/dL (ref 12.0–15.0)
Lymphocytes Relative: 16 %
Lymphs Abs: 1.3 10*3/uL (ref 0.7–4.0)
MCH: 29.8 pg (ref 26.0–34.0)
MCHC: 32.6 g/dL (ref 30.0–36.0)
MCV: 91.3 fL (ref 78.0–100.0)
Monocytes Absolute: 0.6 10*3/uL (ref 0.1–1.0)
Monocytes Relative: 8 %
Neutro Abs: 5.8 10*3/uL (ref 1.7–7.7)
Neutrophils Relative %: 74 %
Platelets: 258 10*3/uL (ref 150–400)
RBC: 4.6 MIL/uL (ref 3.87–5.11)
RDW: 13.7 % (ref 11.5–15.5)
WBC: 7.9 10*3/uL (ref 4.0–10.5)

## 2018-05-01 LAB — BASIC METABOLIC PANEL
Anion gap: 9 (ref 5–15)
BUN: 20 mg/dL (ref 8–23)
CO2: 28 mmol/L (ref 22–32)
Calcium: 9 mg/dL (ref 8.9–10.3)
Chloride: 103 mmol/L (ref 98–111)
Creatinine, Ser: 0.71 mg/dL (ref 0.44–1.00)
GFR calc Af Amer: >60 mL/min (ref >60)
GFR calc non Af Amer: >60 mL/min (ref >60)
Glucose, Bld: 137 mg/dL — ABNORMAL HIGH (ref 70–99)
Potassium: 4.2 mmol/L (ref 3.5–5.1)
Sodium: 140 mmol/L (ref 135–145)

## 2018-05-01 LAB — PREPARE RBC (CROSSMATCH)

## 2018-05-01 LAB — GLUCOSE, CAPILLARY: Glucose-Capillary: 135 mg/dL — ABNORMAL HIGH (ref 70–99)

## 2018-05-02 LAB — HEMOGLOBIN A1C
Hgb A1c MFr Bld: 5.9 % — ABNORMAL HIGH (ref 4.8–5.6)
Mean Plasma Glucose: 123 mg/dL

## 2018-05-04 LAB — ABO/RH: ABO/RH(D): O POS

## 2018-05-04 NOTE — H&P (Signed)
Patient ID: Amy [name], female   DOB: 12-19-1935, 82 y.o.   MRN: [id number]        Chief Complaint  Patient presents with  . Knee Pain      HPI The patient presents for evaluation of HER RIGHT KNEE  SHE IS S/P PLIF ON January 26, 2018.   Location RIGHT KNEE  Duration 1 YEAR OR MORE  Quality DULL  Severity 1-10/10: 8 Associated with PAIN WHEN WEIGHT BEARING *   Review of Systems  Musculoskeletal:       She says she hurts all over her back still hurts  All other systems reviewed and are negative.        Past Medical History:  Diagnosis Date  . Anxiety    . Arthritis    . Back pain    . Colon polyps    . Depression    . Diabetes mellitus, type II ([place])      diet controlled  . Hypercholesterolemia    . Invasive ductal carcinoma of breast, female, right ([place])    . Osteopenia 11/26/2016  . Skin-picking disorder      has 3 open wounds on right wrist that are being treated. About a quarter in size.      Past Surgical History:  Procedure Laterality Date  . APPENDECTOMY    . BREAST BIOPSY Right 10/09/2016   Procedure: BREAST BIOPSY WITH NEEDLE LOCALIZATION;  Surgeon: [name], MD;  Location: [facility];  Service: General;  Laterality: Right;  . BREAST SURGERY Right   . BUNIONECTOMY Bilateral   . FACIAL COSMETIC SURGERY     drooping eyelid and brow  . HERNIA REPAIR Right    Inguinal x2  . KNEE ARTHROSCOPY Right   . SHOULDER SURGERY Right    rotator cuff repair and removal of bone spur  . TEMPOROMANDIBULAR JOINT SURGERY Right    Family History  Problem Relation Age of Onset  . Anxiety disorder Mother   . Depression Mother   . Depression Maternal Grandmother   . Alcohol abuse Brother   . Cancer - Other Sister   . ADD / ADHD Neg Hx   . Bipolar disorder Neg Hx   . Dementia Neg Hx   . Drug abuse Neg Hx   . OCD Neg Hx   . Paranoid behavior Neg Hx   . Schizophrenia Neg Hx   . Seizures Neg Hx   . Sexual abuse Neg Hx   . Physical abuse Neg Hx    Social History    Tobacco Use  . Smoking status: Former Smoker    Packs/day: 0.50    Years: 40.00    Pack years: 20.00    Types: Cigarettes    Last attempt to quit: 09/04/1995    Years since quitting: 22.6  . Smokeless tobacco: Never Used  Substance Use Topics  . Alcohol use: No  . Drug use: No    Types: Benzodiazepines, Hydrocodone   Current Meds  Medication Sig  . ALPRAZolam (XANAX) 0.5 MG tablet Take 1 tablet (0.5 mg total) by mouth daily as needed for anxiety. (Patient taking differently: Take 0.5 mg by mouth at bedtime. )  . calcium carbonate (OSCAL) 1500 (600 Ca) MG TABS tablet Take 600 mg of elemental calcium by mouth daily.  . Cholecalciferol (VITAMIN D PO) Take 1 capsule by mouth daily.  . cholecalciferol (VITAMIN D) 1000 units tablet Take 1 tablet (1,000 Units total) by mouth daily.  Marland Kitchen escitalopram (LEXAPRO) 20 MG tablet  Take 1 tablet (20 mg total) by mouth daily.  Marland Kitchen gabapentin (NEURONTIN) 300 MG capsule Take 300 mg by mouth at bedtime.   Marland Kitchen ibuprofen (ADVIL,MOTRIN) 200 MG tablet Take 200-400 mg by mouth every 6 (six) hours as needed for mild pain.  Marland Kitchen letrozole (FEMARA) 2.5 MG tablet Take 1 tablet (2.5 mg total) by mouth daily.  . metFORMIN (GLUCOPHAGE) 500 MG tablet Take 500 mg by mouth 2 (two) times daily with a meal.   . methocarbamol (ROBAXIN) 500 MG tablet Take 500 mg by mouth 4 (four) times daily as needed for muscle spasms.   Marland Kitchen oxyCODONE 10 MG TABS Take 1 tablet (10 mg total) by mouth every 3 (three) hours as needed for severe pain ((score 7 to 10)). (Patient taking differently: Take 7.5 mg by mouth 2 (two) times daily. )  . oxyCODONE-acetaminophen (PERCOCET) 7.5-325 MG tablet Take 1 tablet by mouth 2 (two) times daily.   . risperiDONE (RISPERDAL) 1 MG tablet Take 1 tablet (1 mg total) by mouth at bedtime.  . traZODone (DESYREL) 50 MG tablet Take 1 tablet (50 mg total) by mouth at bedtime.  . valACYclovir (VALTREX) 500 MG tablet Take 1,000 mg by mouth See admin instructions. Take 2  tablets (1000 mg) by mouth daily for 3 days as needed for outbreaks.  Marland Kitchen venlafaxine XR (EFFEXOR XR) 150 MG 24 hr capsule Take 1 capsule (150 mg total) by mouth 2 (two) times daily.  . [DISCONTINUED] Calcium Carb-Cholecalciferol (OYSTER CALCIUM/D3) 500-200 MG-UNIT TABS Take 1 tablet by mouth 2 (two) times daily.            Allergies  Allergen Reactions  . Cymbalta [Duloxetine Hcl] Other (See Comments)      Hallucinated and couldn't think; got worse and worse the longer she took this.        BP 123/70   Pulse 90   Ht 5\' 4"  (1.626 m)   Wt 157 lb (71.2 kg)   BMI 26.95 kg/m      Physical Exam General appearance normal Oriented x3 normal Mood pleasant affect normal Gait when she walks she has poor posture with flexion of the lumbar-pelvic junction and slow cadence and flexed knee and short stride length    UE: NO CLUBBING CYANOSIS OR EDEMA NORMAL ALIGNMENT AND ROM, MOTOR FUNCTION NORMAL    Ortho Exam Left knee and lower Extremity Inspection and palpation revealed no abnormalities Range of motion is full No instability was detected on stress testing Muscle tone and strength was normal without tremor Skin was warm dry and intact Good pulse and temperature were noted in the extremity Sensation revealed no abnormalities to light touch     Right knee: She still has excellent range of motion she is tender to palpation along the lateral compartment she has crepitance on range of motion her strength is still intact she has good stability to the knee no swelling neurovascular exam is intact no atrophy is noted         MEDICAL DECISION MAKING    Imaging:  X-ray today compared to last year shows progressive arthritis in the lateral compartment and more severe valgus alignment see report         Encounter Diagnoses  Name Primary?  . Primary osteoarthritis of right knee Yes  . Degenerative lumbar spinal stenosis, status post posterior lumbar interbody fusion 2019           PLAN:   RT TKA   The procedure has been fully reviewed with  the patient; The risks and benefits of surgery have been discussed and explained and understood. Alternative treatment has also been reviewed, questions were encouraged and answered. The postoperative plan is also been reviewed.

## 2018-05-05 ENCOUNTER — Inpatient Hospital Stay (HOSPITAL_COMMUNITY): Payer: Medicare HMO

## 2018-05-05 ENCOUNTER — Other Ambulatory Visit: Payer: Self-pay

## 2018-05-05 ENCOUNTER — Encounter (HOSPITAL_COMMUNITY): Payer: Self-pay | Admitting: Anesthesiology

## 2018-05-05 ENCOUNTER — Encounter (HOSPITAL_COMMUNITY)
Admission: RE | Disposition: A | Discharge status: Skilled Nursing Facility | Payer: Self-pay | Source: Ambulatory Visit | Attending: Orthopedic Surgery

## 2018-05-05 ENCOUNTER — Inpatient Hospital Stay (HOSPITAL_COMMUNITY): Payer: Medicare HMO | Admitting: Anesthesiology

## 2018-05-05 ENCOUNTER — Inpatient Hospital Stay (HOSPITAL_COMMUNITY)
Admission: RE | Admit: 2018-05-05 | Discharge: 2018-05-11 | DRG: 470 | Disposition: A | Discharge status: Skilled Nursing Facility | Payer: Medicare HMO | Source: Ambulatory Visit | Attending: Orthopedic Surgery | Admitting: Orthopedic Surgery

## 2018-05-05 DIAGNOSIS — M1990 Unspecified osteoarthritis, unspecified site: Secondary | ICD-10-CM | POA: Diagnosis not present

## 2018-05-05 DIAGNOSIS — F418 Other specified anxiety disorders: Secondary | ICD-10-CM | POA: Diagnosis not present

## 2018-05-05 DIAGNOSIS — R2689 Other abnormalities of gait and mobility: Secondary | ICD-10-CM | POA: Diagnosis not present

## 2018-05-05 DIAGNOSIS — Z888 Allergy status to other drugs, medicaments and biological substances status: Secondary | ICD-10-CM

## 2018-05-05 DIAGNOSIS — R69 Illness, unspecified: Secondary | ICD-10-CM | POA: Diagnosis not present

## 2018-05-05 DIAGNOSIS — D62 Acute posthemorrhagic anemia: Secondary | ICD-10-CM | POA: Diagnosis not present

## 2018-05-05 DIAGNOSIS — E785 Hyperlipidemia, unspecified: Secondary | ICD-10-CM | POA: Diagnosis not present

## 2018-05-05 DIAGNOSIS — E7800 Pure hypercholesterolemia, unspecified: Secondary | ICD-10-CM | POA: Diagnosis not present

## 2018-05-05 DIAGNOSIS — Z7984 Long term (current) use of oral hypoglycemic drugs: Secondary | ICD-10-CM

## 2018-05-05 DIAGNOSIS — M6281 Muscle weakness (generalized): Secondary | ICD-10-CM | POA: Diagnosis not present

## 2018-05-05 DIAGNOSIS — R410 Disorientation, unspecified: Secondary | ICD-10-CM | POA: Diagnosis not present

## 2018-05-05 DIAGNOSIS — R262 Difficulty in walking, not elsewhere classified: Secondary | ICD-10-CM | POA: Diagnosis not present

## 2018-05-05 DIAGNOSIS — E119 Type 2 diabetes mellitus without complications: Secondary | ICD-10-CM | POA: Diagnosis not present

## 2018-05-05 DIAGNOSIS — Z96651 Presence of right artificial knee joint: Secondary | ICD-10-CM | POA: Diagnosis not present

## 2018-05-05 DIAGNOSIS — F419 Anxiety disorder, unspecified: Secondary | ICD-10-CM | POA: Diagnosis present

## 2018-05-05 DIAGNOSIS — M1711 Unilateral primary osteoarthritis, right knee: Principal | ICD-10-CM | POA: Diagnosis present

## 2018-05-05 DIAGNOSIS — Z853 Personal history of malignant neoplasm of breast: Secondary | ICD-10-CM | POA: Diagnosis not present

## 2018-05-05 DIAGNOSIS — Z87891 Personal history of nicotine dependence: Secondary | ICD-10-CM | POA: Diagnosis not present

## 2018-05-05 DIAGNOSIS — E1149 Type 2 diabetes mellitus with other diabetic neurological complication: Secondary | ICD-10-CM | POA: Diagnosis not present

## 2018-05-05 DIAGNOSIS — Z79899 Other long term (current) drug therapy: Secondary | ICD-10-CM | POA: Diagnosis not present

## 2018-05-05 DIAGNOSIS — Z471 Aftercare following joint replacement surgery: Secondary | ICD-10-CM | POA: Diagnosis not present

## 2018-05-05 DIAGNOSIS — C50911 Malignant neoplasm of unspecified site of right female breast: Secondary | ICD-10-CM | POA: Diagnosis not present

## 2018-05-05 DIAGNOSIS — G8929 Other chronic pain: Secondary | ICD-10-CM | POA: Diagnosis not present

## 2018-05-05 DIAGNOSIS — M48061 Spinal stenosis, lumbar region without neurogenic claudication: Secondary | ICD-10-CM | POA: Diagnosis not present

## 2018-05-05 DIAGNOSIS — D649 Anemia, unspecified: Secondary | ICD-10-CM | POA: Diagnosis not present

## 2018-05-05 HISTORY — PX: TOTAL KNEE ARTHROPLASTY: SHX125

## 2018-05-05 LAB — GLUCOSE, CAPILLARY
Glucose-Capillary: 106 mg/dL — ABNORMAL HIGH (ref 70–99)
Glucose-Capillary: 158 mg/dL — ABNORMAL HIGH (ref 70–99)
Glucose-Capillary: 204 mg/dL — ABNORMAL HIGH (ref 70–99)
Glucose-Capillary: 221 mg/dL — ABNORMAL HIGH (ref 70–99)
Glucose-Capillary: 188 mg/dL — ABNORMAL HIGH (ref 70–99)

## 2018-05-05 SURGERY — ARTHROPLASTY, KNEE, TOTAL
Anesthesia: General | Site: Knee | Laterality: Right

## 2018-05-05 MED ORDER — HYDROCODONE-ACETAMINOPHEN 7.5-325 MG PO TABS: 1.0000 | ORAL_TABLET | Freq: Once | ORAL | Status: DC | PRN

## 2018-05-05 MED ORDER — METFORMIN HCL 500 MG PO TABS
500.0000 mg | ORAL_TABLET | Freq: Two times a day (BID) | ORAL | Status: DC
  Administered 2018-05-06 – 2018-05-11 (×11): 500 mg via ORAL
  Filled 2018-05-05 (×11): qty 1

## 2018-05-05 MED ORDER — RISPERIDONE 1 MG PO TABS
1.0000 mg | ORAL_TABLET | Freq: Every day | ORAL | Status: DC
  Administered 2018-05-06 – 2018-05-10 (×5): 1 mg via ORAL
  Filled 2018-05-05 (×5): qty 1

## 2018-05-05 MED ORDER — ALUM & MAG HYDROXIDE-SIMETH 200-200-20 MG/5ML PO SUSP: 30.0000 mL | ORAL | Status: DC | PRN

## 2018-05-05 MED ORDER — FENTANYL CITRATE (PF) 250 MCG/5ML IJ SOLN
INTRAMUSCULAR | Status: AC
  Filled 2018-05-05: qty 5

## 2018-05-05 MED ORDER — METOCLOPRAMIDE HCL 5 MG/ML IJ SOLN: 5.0000 mg | Freq: Three times a day (TID) | INTRAMUSCULAR | Status: DC | PRN

## 2018-05-05 MED ORDER — FENTANYL CITRATE (PF) 100 MCG/2ML IJ SOLN
INTRAMUSCULAR | Status: AC
  Filled 2018-05-05: qty 2

## 2018-05-05 MED ORDER — VITAMIN D 1000 UNITS PO TABS
1000.0000 [IU] | ORAL_TABLET | Freq: Every day | ORAL | Status: DC
  Administered 2018-05-06 – 2018-05-11 (×6): 1000 [IU] via ORAL
  Filled 2018-05-05 (×6): qty 1

## 2018-05-05 MED ORDER — MIDAZOLAM HCL 2 MG/2ML IJ SOLN: 0.5000 mg | Freq: Once | INTRAMUSCULAR | Status: DC | PRN

## 2018-05-05 MED ORDER — ESCITALOPRAM OXALATE 10 MG PO TABS
20.0000 mg | ORAL_TABLET | Freq: Every day | ORAL | Status: DC
  Administered 2018-05-06 – 2018-05-11 (×6): 20 mg via ORAL
  Filled 2018-05-05 (×6): qty 2

## 2018-05-05 MED ORDER — PROPOFOL 10 MG/ML IV BOLUS
INTRAVENOUS | Status: AC
  Filled 2018-05-05: qty 40

## 2018-05-05 MED ORDER — METOCLOPRAMIDE HCL 10 MG PO TABS: 5.0000 mg | ORAL_TABLET | Freq: Three times a day (TID) | ORAL | Status: DC | PRN

## 2018-05-05 MED ORDER — FENTANYL CITRATE (PF) 100 MCG/2ML IJ SOLN
INTRAMUSCULAR | Status: DC | PRN
  Administered 2018-05-05 (×5): 50 ug via INTRAVENOUS

## 2018-05-05 MED ORDER — ONDANSETRON HCL 4 MG PO TABS: 4.0000 mg | ORAL_TABLET | Freq: Four times a day (QID) | ORAL | Status: DC | PRN

## 2018-05-05 MED ORDER — ONDANSETRON HCL 4 MG/2ML IJ SOLN: 4.0000 mg | Freq: Four times a day (QID) | INTRAMUSCULAR | Status: DC | PRN

## 2018-05-05 MED ORDER — MORPHINE SULFATE (PF) 4 MG/ML IV SOLN
4.0000 mg | INTRAVENOUS | Status: DC | PRN
  Filled 2018-05-05: qty 1

## 2018-05-05 MED ORDER — PREGABALIN 50 MG PO CAPS
50.0000 mg | ORAL_CAPSULE | Freq: Three times a day (TID) | ORAL | Status: DC
  Administered 2018-05-05: 50 mg via ORAL

## 2018-05-05 MED ORDER — PHENYLEPHRINE HCL 10 MG/ML IJ SOLN
INTRAMUSCULAR | Status: DC | PRN
  Administered 2018-05-05: 100 ug via INTRAVENOUS
  Administered 2018-05-05 (×3): 80 ug via INTRAVENOUS
  Administered 2018-05-05: 120 ug via INTRAVENOUS

## 2018-05-05 MED ORDER — ASPIRIN EC 325 MG PO TBEC
325.0000 mg | DELAYED_RELEASE_TABLET | Freq: Every day | ORAL | Status: DC
  Administered 2018-05-06 – 2018-05-11 (×6): 325 mg via ORAL
  Filled 2018-05-05 (×6): qty 1

## 2018-05-05 MED ORDER — ROCURONIUM 10MG/ML (10ML) SYRINGE FOR MEDFUSION PUMP - OPTIME
INTRAVENOUS | Status: DC | PRN
  Administered 2018-05-05: 40 mg via INTRAVENOUS

## 2018-05-05 MED ORDER — DIPHENHYDRAMINE HCL 12.5 MG/5ML PO ELIX: 12.5000 mg | ORAL_SOLUTION | ORAL | Status: DC | PRN

## 2018-05-05 MED ORDER — CALCIUM CARBONATE 1250 (500 CA) MG PO TABS
500.0000 mg | ORAL_TABLET | Freq: Every day | ORAL | Status: DC
  Administered 2018-05-06 – 2018-05-11 (×6): 500 mg via ORAL
  Filled 2018-05-05 (×7): qty 1

## 2018-05-05 MED ORDER — GABAPENTIN 300 MG PO CAPS: 300.0000 mg | ORAL_CAPSULE | Freq: Every day | ORAL | Status: DC

## 2018-05-05 MED ORDER — PHENYLEPHRINE 40 MCG/ML (10ML) SYRINGE FOR IV PUSH (FOR BLOOD PRESSURE SUPPORT)
PREFILLED_SYRINGE | INTRAVENOUS | Status: AC
  Filled 2018-05-05: qty 10

## 2018-05-05 MED ORDER — MENTHOL 3 MG MT LOZG: 1.0000 | LOZENGE | OROMUCOSAL | Status: DC | PRN

## 2018-05-05 MED ORDER — PROPOFOL 10 MG/ML IV BOLUS
INTRAVENOUS | Status: DC | PRN
  Administered 2018-05-05: 120 mg via INTRAVENOUS

## 2018-05-05 MED ORDER — METHOCARBAMOL 500 MG PO TABS
500.0000 mg | ORAL_TABLET | Freq: Four times a day (QID) | ORAL | Status: DC | PRN
  Administered 2018-05-11: 500 mg via ORAL
  Filled 2018-05-05: qty 1

## 2018-05-05 MED ORDER — DEXAMETHASONE SODIUM PHOSPHATE 10 MG/ML IJ SOLN
10.0000 mg | Freq: Once | INTRAMUSCULAR | Status: AC
  Administered 2018-05-06: 10 mg via INTRAVENOUS
  Filled 2018-05-05: qty 1

## 2018-05-05 MED ORDER — CEFAZOLIN SODIUM-DEXTROSE 2-4 GM/100ML-% IV SOLN
2.0000 g | Freq: Four times a day (QID) | INTRAVENOUS | Status: AC
  Administered 2018-05-05 (×2): 2 g via INTRAVENOUS
  Filled 2018-05-05 (×2): qty 100

## 2018-05-05 MED ORDER — PHENOL 1.4 % MT LIQD: 1.0000 | OROMUCOSAL | Status: DC | PRN

## 2018-05-05 MED ORDER — CHLORHEXIDINE GLUCONATE 4 % EX LIQD: 60.0000 mL | Freq: Once | CUTANEOUS | Status: DC

## 2018-05-05 MED ORDER — ONDANSETRON HCL 4 MG/2ML IJ SOLN
INTRAMUSCULAR | Status: DC | PRN
  Administered 2018-05-05: 4 mg via INTRAVENOUS

## 2018-05-05 MED ORDER — OXYCODONE-ACETAMINOPHEN 7.5-325 MG PO TABS: 1.0000 | ORAL_TABLET | ORAL | Status: DC | PRN

## 2018-05-05 MED ORDER — SODIUM CHLORIDE 0.9 % IJ SOLN
INTRAMUSCULAR | Status: AC
  Filled 2018-05-05: qty 40

## 2018-05-05 MED ORDER — DOCUSATE SODIUM 100 MG PO CAPS
100.0000 mg | ORAL_CAPSULE | Freq: Two times a day (BID) | ORAL | Status: DC
  Administered 2018-05-06 – 2018-05-11 (×11): 100 mg via ORAL
  Filled 2018-05-05 (×11): qty 1

## 2018-05-05 MED ORDER — ALPRAZOLAM 0.5 MG PO TABS: 0.5000 mg | ORAL_TABLET | Freq: Every day | ORAL | Status: DC

## 2018-05-05 MED ORDER — HYDROMORPHONE HCL 1 MG/ML IJ SOLN
0.2500 mg | INTRAMUSCULAR | Status: DC | PRN
  Administered 2018-05-05: 0.5 mg via INTRAVENOUS
  Filled 2018-05-05: qty 0.5

## 2018-05-05 MED ORDER — PROMETHAZINE HCL 25 MG/ML IJ SOLN: 6.2500 mg | INTRAMUSCULAR | Status: DC | PRN

## 2018-05-05 MED ORDER — EPHEDRINE SULFATE 50 MG/ML IJ SOLN
INTRAMUSCULAR | Status: DC | PRN
  Administered 2018-05-05: 10 mg via INTRAVENOUS
  Administered 2018-05-05 (×2): 15 mg via INTRAVENOUS
  Administered 2018-05-05: 10 mg via INTRAVENOUS

## 2018-05-05 MED ORDER — SENNOSIDES-DOCUSATE SODIUM 8.6-50 MG PO TABS: 1.0000 | ORAL_TABLET | Freq: Every evening | ORAL | Status: DC | PRN

## 2018-05-05 MED ORDER — PREGABALIN 50 MG PO CAPS
ORAL_CAPSULE | ORAL | Status: AC
  Filled 2018-05-05: qty 1

## 2018-05-05 MED ORDER — SODIUM CHLORIDE 0.9 % IR SOLN
Status: DC | PRN
  Administered 2018-05-05: 3000 mL

## 2018-05-05 MED ORDER — VENLAFAXINE HCL ER 75 MG PO CP24
150.0000 mg | ORAL_CAPSULE | Freq: Two times a day (BID) | ORAL | Status: DC
  Administered 2018-05-06 – 2018-05-11 (×11): 150 mg via ORAL
  Filled 2018-05-05 (×11): qty 2

## 2018-05-05 MED ORDER — LACTATED RINGERS IV SOLN
INTRAVENOUS | Status: DC
  Administered 2018-05-05 (×2): via INTRAVENOUS

## 2018-05-05 MED ORDER — BUPIVACAINE-EPINEPHRINE (PF) 0.5% -1:200000 IJ SOLN
INTRAMUSCULAR | Status: AC
  Filled 2018-05-05: qty 30

## 2018-05-05 MED ORDER — BUPIVACAINE-EPINEPHRINE (PF) 0.5% -1:200000 IJ SOLN
INTRAMUSCULAR | Status: DC | PRN
  Administered 2018-05-05: 20 mL via PERINEURAL

## 2018-05-05 MED ORDER — LIDOCAINE HCL (CARDIAC) PF 50 MG/5ML IV SOSY
PREFILLED_SYRINGE | INTRAVENOUS | Status: DC | PRN
  Administered 2018-05-05: 30 mg via INTRAVENOUS

## 2018-05-05 MED ORDER — CELECOXIB 100 MG PO CAPS: 200.0000 mg | ORAL_CAPSULE | Freq: Every day | ORAL | Status: DC

## 2018-05-05 MED ORDER — HYDROCODONE-ACETAMINOPHEN 5-325 MG PO TABS
1.0000 | ORAL_TABLET | Freq: Once | ORAL | Status: AC
  Administered 2018-05-05: 1 via ORAL
  Filled 2018-05-05: qty 1

## 2018-05-05 MED ORDER — ONDANSETRON HCL 4 MG/2ML IJ SOLN
4.0000 mg | Freq: Four times a day (QID) | INTRAMUSCULAR | Status: DC
  Administered 2018-05-05 – 2018-05-09 (×10): 4 mg via INTRAVENOUS
  Filled 2018-05-05 (×12): qty 2

## 2018-05-05 MED ORDER — SODIUM CHLORIDE 0.9 % IV SOLN
INTRAVENOUS | Status: DC | PRN
  Administered 2018-05-05: 60 mL

## 2018-05-05 MED ORDER — LETROZOLE 2.5 MG PO TABS
2.5000 mg | ORAL_TABLET | Freq: Every day | ORAL | Status: DC
  Administered 2018-05-06 – 2018-05-11 (×6): 2.5 mg via ORAL
  Filled 2018-05-05 (×9): qty 1

## 2018-05-05 MED ORDER — TRAZODONE HCL 50 MG PO TABS
50.0000 mg | ORAL_TABLET | Freq: Every day | ORAL | Status: DC
  Administered 2018-05-06 – 2018-05-10 (×5): 50 mg via ORAL
  Filled 2018-05-05 (×5): qty 1

## 2018-05-05 MED ORDER — 0.9 % SODIUM CHLORIDE (POUR BTL) OPTIME
TOPICAL | Status: DC | PRN
  Administered 2018-05-05: 1000 mL

## 2018-05-05 MED ORDER — ONDANSETRON HCL 4 MG/2ML IJ SOLN
INTRAMUSCULAR | Status: AC
  Filled 2018-05-05: qty 2

## 2018-05-05 MED ORDER — CELECOXIB 100 MG PO CAPS
200.0000 mg | ORAL_CAPSULE | Freq: Every day | ORAL | Status: DC
  Administered 2018-05-05 – 2018-05-11 (×7): 200 mg via ORAL
  Filled 2018-05-05 (×7): qty 2

## 2018-05-05 MED ORDER — CEFAZOLIN SODIUM-DEXTROSE 2-4 GM/100ML-% IV SOLN
2.0000 g | INTRAVENOUS | Status: AC
  Administered 2018-05-05: 2 g via INTRAVENOUS
  Filled 2018-05-05: qty 100

## 2018-05-05 MED ORDER — SODIUM CHLORIDE 0.9 % IV SOLN
INTRAVENOUS | Status: AC
  Administered 2018-05-05: 23:00:00 via INTRAVENOUS

## 2018-05-05 SURGICAL SUPPLY — 70 items
BANDAGE ELASTIC 4 LF NS (GAUZE/BANDAGES/DRESSINGS) ×4 IMPLANT
BANDAGE ELASTIC 6 LF NS (GAUZE/BANDAGES/DRESSINGS) ×2 IMPLANT
BANDAGE ESMARK 6X9 LF (GAUZE/BANDAGES/DRESSINGS) ×1 IMPLANT
BIT DRILL 3.2X128 (BIT) IMPLANT
BLADE HEX COATED 2.75 (ELECTRODE) ×2 IMPLANT
BLADE SAGITTAL 25.0X1.27X90 (BLADE) ×2 IMPLANT
BNDG CMPR 9X6 STRL LF SNTH (GAUZE/BANDAGES/DRESSINGS) ×1
BNDG CMPR MED 5X4 ELC HKLP NS (GAUZE/BANDAGES/DRESSINGS) ×2
BNDG ESMARK 6X9 LF (GAUZE/BANDAGES/DRESSINGS) ×2
CEMENT HV SMART SET (Cement) ×4 IMPLANT
CLOTH BEACON ORANGE TIMEOUT ST (SAFETY) ×2 IMPLANT
COOLER CRYO CUFF IC AND MOTOR (MISCELLANEOUS) ×2 IMPLANT
COVER LIGHT HANDLE STERIS (MISCELLANEOUS) ×4 IMPLANT
CUFF CRYO KNEE18X23 MED (MISCELLANEOUS) ×2 IMPLANT
CUFF TOURNIQUET SINGLE 34IN LL (TOURNIQUET CUFF) ×2 IMPLANT
DECANTER SPIKE VIAL GLASS SM (MISCELLANEOUS) ×4 IMPLANT
DRAPE BACK TABLE (DRAPES) ×2 IMPLANT
DRAPE EXTREMITY T 121X128X90 (DRAPE) ×2 IMPLANT
DRESSING AQUACEL AG ADV 3.5X12 (MISCELLANEOUS) ×1 IMPLANT
DRSG AQUACEL AG ADV 3.5X12 (MISCELLANEOUS) ×2
DRSG MEPILEX BORDER 4X12 (GAUZE/BANDAGES/DRESSINGS) ×2 IMPLANT
DURAPREP 26ML APPLICATOR (WOUND CARE) ×4 IMPLANT
ELECT REM PT RETURN 9FT ADLT (ELECTROSURGICAL) ×2
ELECTRODE REM PT RTRN 9FT ADLT (ELECTROSURGICAL) ×1 IMPLANT
EVACUATOR 3/16  PVC DRAIN (DRAIN) ×1
EVACUATOR 3/16 PVC DRAIN (DRAIN) ×1 IMPLANT
FEMORAL POS LUG CEM SZ 4N (Orthopedic Implant) ×2 IMPLANT
GLOVE BIO SURGEON STRL SZ7 (GLOVE) ×4 IMPLANT
GLOVE BIOGEL PI IND STRL 7.0 (GLOVE) ×2 IMPLANT
GLOVE BIOGEL PI INDICATOR 7.0 (GLOVE) ×2
GLOVE SKINSENSE NS SZ8.0 LF (GLOVE) ×2
GLOVE SKINSENSE STRL SZ8.0 LF (GLOVE) ×2 IMPLANT
GLOVE SS N UNI LF 8.5 STRL (GLOVE) ×2 IMPLANT
GOWN STRL REUS W/ TWL LRG LVL3 (GOWN DISPOSABLE) ×1 IMPLANT
GOWN STRL REUS W/TWL LRG LVL3 (GOWN DISPOSABLE) ×3 IMPLANT
GOWN STRL REUS W/TWL XL LVL3 (GOWN DISPOSABLE) ×2 IMPLANT
HANDPIECE INTERPULSE COAX TIP (DISPOSABLE) ×2
HOOD W/PEELAWAY (MISCELLANEOUS) ×6 IMPLANT
INSERT STABILIZED 10MM (Knees) ×2 IMPLANT
INST SET MAJOR BONE (KITS) ×2 IMPLANT
IV NS IRRIG 3000ML ARTHROMATIC (IV SOLUTION) ×2 IMPLANT
KIT BLADEGUARD II DBL (SET/KITS/TRAYS/PACK) ×2 IMPLANT
KIT TURNOVER CYSTO (KITS) ×2 IMPLANT
MANIFOLD NEPTUNE II (INSTRUMENTS) ×2 IMPLANT
MARKER SKIN DUAL TIP RULER LAB (MISCELLANEOUS) ×2 IMPLANT
NEEDLE HYPO 21X1.5 SAFETY (NEEDLE) ×2 IMPLANT
NS IRRIG 1000ML POUR BTL (IV SOLUTION) ×2 IMPLANT
PACK TOTAL JOINT (CUSTOM PROCEDURE TRAY) ×2 IMPLANT
PAD ARMBOARD 7.5X6 YLW CONV (MISCELLANEOUS) ×2 IMPLANT
PAD DANNIFLEX CPM (ORTHOPEDIC SUPPLIES) ×2 IMPLANT
PILLOW KNEE EXTENSION 0 DEG (MISCELLANEOUS) ×2 IMPLANT
PIN THREADED HEADED SIGMA (PIN) ×2 IMPLANT
PIN TROCAR 3 INCH (PIN) IMPLANT
PIN/DRILL PACK ORTHO 1/8X3.0 (PIN) ×2 IMPLANT
SAW OSC TIP CART 19.5X105X1.3 (SAW) ×2 IMPLANT
SET BASIN LINEN APH (SET/KITS/TRAYS/PACK) ×2 IMPLANT
SET HNDPC FAN SPRY TIP SCT (DISPOSABLE) ×1 IMPLANT
STAPLER VISISTAT 35W (STAPLE) ×2 IMPLANT
SUT BRALON NAB BRD #1 30IN (SUTURE) ×4 IMPLANT
SUT MNCRL 0 VIOLET CTX 36 (SUTURE) ×1 IMPLANT
SUT MON AB 0 CT1 (SUTURE) ×2 IMPLANT
SUT MON AB 2-0 CT1 36 (SUTURE) IMPLANT
SUT MONOCRYL 0 CTX 36 (SUTURE) ×1
SYRINGE 20CC LL (MISCELLANEOUS) ×10 IMPLANT
TOWEL OR 17X26 4PK STRL BLUE (TOWEL DISPOSABLE) ×2 IMPLANT
TOWER CARTRIDGE SMART MIX (DISPOSABLE) ×2 IMPLANT
TRAY FOLEY MTR SLVR 16FR STAT (SET/KITS/TRAYS/PACK) ×2 IMPLANT
TRAY TIBIAL SZ3 COBALT 71X47MM (Knees) ×2 IMPLANT
WATER STERILE IRR 1000ML POUR (IV SOLUTION) ×4 IMPLANT
YANKAUER SUCT 12FT TUBE ARGYLE (SUCTIONS) ×2 IMPLANT

## 2018-05-05 NOTE — Plan of Care (Signed)
  Problem: Acute Rehab PT Goals(only PT should resolve) Goal: Pt Will Go Supine/Side To Sit Outcome: Progressing Flowsheets (Taken 05/05/2018 1536) Pt will go Supine/Side to Sit: with minimal assist Goal: Patient Will Transfer Sit To/From Stand Outcome: Progressing Flowsheets (Taken 05/05/2018 1536) Patient will transfer sit to/from stand: with minimal assist Goal: Pt Will Transfer Bed To Chair/Chair To Bed Outcome: Progressing Flowsheets (Taken 05/05/2018 1536) Pt will Transfer Bed to Chair/Chair to Bed: with min assist Goal: Pt Will Ambulate Outcome: Progressing Flowsheets (Taken 05/05/2018 1536) Pt will Ambulate: 50 feet; with minimal assist; with rolling walker   3:37 PM, 05/05/18 Lonell Grandchild, MPT Physical Therapist with [facility] [phone] office [phone] mobile phone

## 2018-05-05 NOTE — Interval H&P Note (Signed)
History and Physical Interval Note:  05/05/2018 7:26 AM  Amy [name]  has presented today for surgery, with the diagnosis of Osteoarthritis right knee  The various methods of treatment have been discussed with the patient and family. After consideration of risks, benefits and other options for treatment, the patient has consented to  Procedure(s): TOTAL KNEE ARTHROPLASTY (Right) as a surgical intervention .  The patient's history has been reviewed, patient examined, no change in status, stable for surgery.  I have reviewed the patient's chart and labs.  Questions were answered to the patient's satisfaction.     Arther Abbott

## 2018-05-05 NOTE — Brief Op Note (Signed)
05/05/2018  9:27 AM  PATIENT:  Amy [name]  82 y.o. female  PRE-OPERATIVE DIAGNOSIS:  Osteoarthritis right knee  POST-OPERATIVE DIAGNOSIS:  Osteoarthritis right knee  PROCEDURE:  Procedure(s): TOTAL KNEE ARTHROPLASTY (Right)  27447   DEPUY SIGMA FB PS TKA 55F 3 T 10 PS NO PATELLA    SURGEON:  Surgeon(s) and Role:    * [name], MD - Primary  PHYSICIAN ASSISTANT:   ASSISTANTS: BETTY ASHLEY AND ANGELA WHITT   ANESTHESIA:   general  EBL:  50 mL   BLOOD ADMINISTERED:none  DRAINS: none   LOCAL MEDICATIONS USED:  MARCAINE   AND EXPAREL   SPECIMEN:  No Specimen  DISPOSITION OF SPECIMEN:  N/A  COUNTS:  YES  TOURNIQUET:   Total Tourniquet Time Documented: Thigh (Right) - 70 minutes Total: Thigh (Right) - 70 minutes   DICTATION: .Viviann Spare Dictation  PLAN OF CARE: Admit to inpatient   PATIENT DISPOSITION:  PACU - hemodynamically stable.   Delay start of Pharmacological VTE agent (>24hrs) due to surgical blood loss or risk of bleeding: no

## 2018-05-05 NOTE — Op Note (Signed)
05/05/2018  9:27 AM  PATIENT:  Amy [name]  82 y.o. female  PRE-OPERATIVE DIAGNOSIS:  Osteoarthritis right knee  POST-OPERATIVE DIAGNOSIS:  Osteoarthritis right knee  PROCEDURE:  Procedure(s): TOTAL KNEE ARTHROPLASTY (Right)  27447   DEPUY SIGMA FB PS TKA 38F 3 T 10 PS NO PATELLA   SURGEON:  Surgeon(s) and Role:    [name], MD - Primary  OPERATIVE FINDINGS:  Valgus knee mild deformity Grade 4 arthritis lateral femoral condyle lateral meniscus intact grade 1 arthritis lateral tibial plateau Grade 3 arthritis medial femoral condyle medial meniscus intact tibia look normal The ACL and PCL were intact Patella was normal   The patient was identified by 2 approved identification mechanisms. The operative extremity was evaluated and found to be acceptable for surgical treatment today. The chart was reviewed. The surgical site was confirmed and marked.  The patient was taken to the operating room and given appropriate antibiotic Ancef 2 g. This is consistent with the SCIP protocol.  The patient was given the following anesthetic: General anesthesia  The patient was then placed supine on the operating table. A Foley catheter was inserted. The operative extremity was prepped and draped sterilely from the toes to the groin.  Timeout procedure was executed confirming the patient's name, surgical site, antibiotic administration, x-rays available, and implants available.  The operative limb,  was exsanguinated with a six-inch Esmarch and the tourniquet was inflated to 300 mmHg.  A straight midline incision was made over the RIGHT KNEE and taken down to the extensor mechanism. A medial arthrotomy was performed. The patella was everted and the patellofemoral band was released. The anterior cruciate ligament and PCL were resected.  The anterior horns of the lateral and medial meniscus were resected. The medial soft tissue sleeve was elevated to the mid coronal plane.  A  three-eighths inch drill bit was used to enter the femoral canal which was decompressed with suction and irrigation until clear. The distal femoral cutting guide was set for 11 mm distal resection,  5valgus alignment, for a right knee. The distal femur was resected and checked for flatness.  The Depuy Sigma sizing femoral guide was placed and the femur was sized to a size 4. A 4-in-1 cutting block was placed along with collateral ligament retractors and the distal femoral cuts were completed.  The external alignment guide for the tibial resection was then applied to the distal and proximal tibia and set for anatomic slope along with 4 MM resection  from the medial.   Rotational alignment was set using the malleolus, the tibial tubercle and the tibial spines.  The proximal tibia was resected along with  residual menisci. The tibia was sized using a base plate to a size 3.   Spacer blocks were used to confirm equal flexion extension gaps with releases done as needed. A size 10 MM spacer block gave equal stability and flexion extension.  The correct sized notch cutting guide for the femur was then applied and the notch cut was made.  Trial reduction was completed using size 4 femur size 3 tibia size 10 posterior stabilized polyethylene trial implants. Patella tracking was normal  The patella was normal  The proximal tibia was prepared using the size 3 base plate.  Thorough irrigation was performed and the bone was dried and prepared for cement. The cement was mixed on the back table using third generation preparation techniques  20 cc of dilute Exparel was injected into the soft tissues including  the posterior capsule.  The implants were then cemented in place and excess cement was removed. The cement was allowed to cure. Irrigation was repeated and excess and residual bone fragments and cement were removed.   The extensor mechanism was closed with #1 Bralon suture followed by subcutaneous  tissue closure using 0 Monocryl suture  30 cc of Marcaine with epinephrine was injected into the joint  Skin approximation was performed using staples  A sterile dressing was applied, followed by a ace wrapped from foot to thigh and then a Cryo/Cuff which was activated   The patient was taken recovery room in stable condition   PHYSICIAN ASSISTANT:   ASSISTANTS: BETTY ASHLEY AND ANGELA WHITT   ANESTHESIA:   general  EBL:  50 mL   BLOOD ADMINISTERED:none  DRAINS: none   LOCAL MEDICATIONS USED:  MARCAINE   AND EXPAREL   SPECIMEN:  No Specimen  DISPOSITION OF SPECIMEN:  N/A  COUNTS:  YES  TOURNIQUET:   Total Tourniquet Time Documented: Thigh (Right) - 70 minutes Total: Thigh (Right) - 70 minutes   DICTATION: .Viviann Spare Dictation  PLAN OF CARE: Admit to inpatient   PATIENT DISPOSITION:  PACU - hemodynamically stable.   Delay start of Pharmacological VTE agent (>24hrs) due to surgical blood loss or risk of bleeding: no

## 2018-05-05 NOTE — Anesthesia Postprocedure Evaluation (Signed)
Anesthesia Post Note  Patient: Amy [name]  Procedure(s) Performed: TOTAL KNEE ARTHROPLASTY (Right Knee)  Patient location during evaluation: PACU Anesthesia Type: General Level of consciousness: awake and alert and oriented Pain management: pain level controlled Vital Signs Assessment: post-procedure vital signs reviewed and stable Respiratory status: spontaneous breathing Cardiovascular status: blood pressure returned to baseline and stable Postop Assessment: no apparent nausea or vomiting Anesthetic complications: no     Last Vitals:  Vitals:   05/05/18 1030 05/05/18 1045  BP: (!) 194/88 (!) 161/80  Pulse: 95 92  Resp: 15 14  Temp:    SpO2: 100% 92%    Last Pain:  Vitals:   05/05/18 1036  TempSrc:   PainSc: 6                  [name]

## 2018-05-05 NOTE — Evaluation (Signed)
Physical Therapy Evaluation Patient Details Name: Amy [name] MRN: [id number] DOB: [birth year] Today's Date: 05/05/2018   RIGHT KNEE ROM: 3-90 degrees CPM PROM: 0-60  degrees AMBULATION DISTANCE: 2 feet using RW with Max assist   History of Present Illness  Amy [name] is a 82 y/o female, s/p Right TKA, 05/05/18 with the diagnosis of Osteoarthritis right knee, history of recent L45, L5-S1 decompressive laminotomies and foraminotomies, L45, L5-S1 PLIF with autograft and T10-S1 PLA w/alografting and fixation 3 months ago    Clinical Impression  Patient presents very lethargic with difficulty staying awake, once seated more alert and tolerated sit to stands x 3 trials, limited to 2-3 very unsteady short side steps due to weakness and lethargy.  Patient put back to bed and tolerated CPM set up at 0-60 degrees with c/o min/mod increase in right knee, but able to tolerate.  Patient will benefit from continued physical therapy in hospital and recommended venue below to increase strength, balance, endurance for safe ADLs and gait.    Follow Up Recommendations SNF    Equipment Recommendations  None recommended by PT    Recommendations for Other Services       Precautions / Restrictions Precautions Precautions: Fall Restrictions Weight Bearing Restrictions: Yes RLE Weight Bearing: Weight bearing as tolerated      Mobility  Bed Mobility Overal bed mobility: Needs Assistance Bed Mobility: Supine to Sit;Sit to Supine     Supine to sit: Mod assist Sit to supine: Mod assist;Max assist   General bed mobility comments: slow labored movement  Transfers Overall transfer level: Needs assistance Equipment used: Rolling walker (2 wheeled) Transfers: Sit to/from Stand Sit to Stand: Mod assist         General transfer comment: slow labored movement  Ambulation/Gait Ambulation/Gait assistance: Max assist Gait Distance (Feet): 2 Feet Assistive device: Rolling walker (2  wheeled) Gait Pattern/deviations: Decreased step length - right;Decreased stance time - right;Decreased stride length Gait velocity: slow   General Gait Details: limited to 3-4 unsteady slow small side steps due to BLE weakness, Right knee pain and lethargy  Stairs            Wheelchair Mobility    Modified Rankin (Stroke Patients Only)       Balance Overall balance assessment: Needs assistance Sitting-balance support: Feet supported;No upper extremity supported Sitting balance-Leahy Scale: Fair     Standing balance support: Bilateral upper extremity supported;During functional activity Standing balance-Leahy Scale: Poor Standing balance comment: fair/poor using RW                             Pertinent Vitals/Pain Pain Assessment: Faces Faces Pain Scale: Hurts even more Pain Location: right knee with movement Pain Descriptors / Indicators: Sore;Sharp;Discomfort;Grimacing Pain Intervention(s): Limited activity within patient's tolerance;Monitored during session    Home Living Family/patient expects to be discharged to:: Private residence Living Arrangements: Alone Available Help at Discharge: Family Type of Home: House Home Access: Level entry     Home Layout: Two level;Able to live on main level with bedroom/bathroom(patient states she rarely goes upstairs) Home Equipment: Gilford Rile - 2 wheels;Cane - single point;Bedside commode;Shower seat;Wheelchair - manual;Hospital bed      Prior Function Level of Independence: Independent with assistive device(s)         Comments: household and short distanced community ambulator with [facility]     Hand Dominance        Extremity/Trunk Assessment   Upper Extremity  Assessment Upper Extremity Assessment: Generalized weakness    Lower Extremity Assessment Lower Extremity Assessment: Generalized weakness;RLE deficits/detail;LLE deficits/detail RLE Deficits / Details: grossly -3/5 LLE Deficits / Details:  grossly -4/5    Cervical / Trunk Assessment Cervical / Trunk Assessment: Normal  Communication   Communication: No difficulties  Cognition Arousal/Alertness: Lethargic;Suspect due to medications Behavior During Therapy: [facility] for tasks assessed/performed Overall Cognitive Status: Within Functional Limits for tasks assessed                                        General Comments      Exercises General Exercises - Lower Extremity Heel Slides: AAROM;Supine;Strengthening;Right;5 reps   Assessment/Plan    PT Assessment Patient needs continued PT services  PT Problem List Decreased strength;Decreased range of motion;Decreased activity tolerance;Decreased balance;Decreased mobility       PT Treatment Interventions Gait training;Functional mobility training;Stair training;Therapeutic activities;Therapeutic exercise;Patient/family education    PT Goals (Current goals can be found in the Care Plan section)  Acute Rehab PT Goals Patient Stated Goal: return home after rehab PT Goal Formulation: With patient Time For Goal Achievement: 05/19/18 Potential to Achieve Goals: Good    Frequency 7X/week   Barriers to discharge        Co-evaluation               AM-PAC PT "6 Clicks" Daily Activity  Outcome Measure Difficulty turning over in bed (including adjusting bedclothes, sheets and blankets)?: A Lot Difficulty moving from lying on back to sitting on the side of the bed? : A Lot Difficulty sitting down on and standing up from a chair with arms (e.g., wheelchair, bedside commode, etc,.)?: A Lot Help needed moving to and from a bed to chair (including a wheelchair)?: A Lot Help needed walking in hospital room?: Total Help needed climbing 3-5 steps with a railing? : Total 6 Click Score: 10    End of Session   Activity Tolerance: Patient limited by fatigue;Patient limited by lethargy;Patient limited by pain Patient left: in bed;in CPM;with call bell/phone  within reach Nurse Communication: Mobility status PT Visit Diagnosis: Unsteadiness on feet (R26.81);Other abnormalities of gait and mobility (R26.89);Muscle weakness (generalized) (M62.81)    Time: 3893-7342 PT Time Calculation (min) (ACUTE ONLY): 32 min   Charges:   PT Evaluation $PT Eval Moderate Complexity: 1 Mod PT Treatments $Therapeutic Activity: 23-37 mins        3:34 PM, 05/05/18 Lonell Grandchild, MPT Physical Therapist with [facility] [phone] office [phone] mobile phone

## 2018-05-05 NOTE — Transfer of Care (Signed)
Immediate Anesthesia Transfer of Care Note  Patient: Amy [name]  Procedure(s) Performed: TOTAL KNEE ARTHROPLASTY (Right Knee)  Patient Location: PACU  Anesthesia Type:General  Level of Consciousness: sedated  Airway & Oxygen Therapy: Patient Spontanous Breathing and Patient connected to face mask oxygen  Post-op Assessment: Report given to RN  Post vital signs: Reviewed and stable  Last Vitals:  Vitals Value Taken Time  BP 186/99 05/05/2018  9:47 AM  Temp    Pulse 95 05/05/2018  9:50 AM  Resp 26 05/05/2018  9:50 AM  SpO2 100 % 05/05/2018  9:50 AM  Vitals shown include unvalidated device data.  Last Pain:  Vitals:   05/05/18 2575  TempSrc: Oral  PainSc: 0-No pain         Complications: No apparent anesthesia complications

## 2018-05-05 NOTE — Anesthesia Preprocedure Evaluation (Signed)
Anesthesia Evaluation  Patient identified by MRN, date of birth, ID band Patient awake    Reviewed: Allergy & Precautions, NPO status , Patient's Chart, lab work & pertinent test results  Airway Mallampati: II  TM Distance: >3 FB Neck ROM: Full    Dental no notable dental hx.    Pulmonary neg pulmonary ROS, former smoker,    Pulmonary exam normal breath sounds clear to auscultation       Cardiovascular Exercise Tolerance: Good negative cardio ROS Normal cardiovascular examII Rhythm:Regular Rate:Normal     Neuro/Psych Anxiety Depression S/p recent multi level fusions  Still not ambulating - Dr. Lemmie Evens. Aware  Will proceed with GETA negative neurological ROS  negative psych ROS   GI/Hepatic negative GI ROS, Neg liver ROS,   Endo/Other  negative endocrine ROSdiabetes, Well Controlled, Type 2  Renal/GU negative Renal ROS  negative genitourinary   Musculoskeletal  (+) Arthritis ,   Abdominal   Peds negative pediatric ROS (+)  Hematology negative hematology ROS (+)   Anesthesia Other Findings H/o Breast Ca -tx'd with Surg - denies CT/RT  Reproductive/Obstetrics negative OB ROS                             Anesthesia Physical Anesthesia Plan  ASA: II  Anesthesia Plan: General   Post-op Pain Management:    Induction: Intravenous  PONV Risk Score and Plan:   Airway Management Planned: Oral ETT  Additional Equipment:   Intra-op Plan:   Post-operative Plan: Extubation in OR  Informed Consent: I have reviewed the patients History and Physical, chart, labs and discussed the procedure including the risks, benefits and alternatives for the proposed anesthesia with the patient or authorized representative who has indicated his/her understanding and acceptance.   Dental advisory given  Plan Discussed with: CRNA  Anesthesia Plan Comments:         Anesthesia Quick Evaluation

## 2018-05-05 NOTE — Anesthesia Procedure Notes (Signed)
Procedure Name: Intubation Date/Time: 05/05/2018 7:44 AM Performed by: Ollen Bowl, CRNA Pre-anesthesia Checklist: Patient identified, Patient being monitored, Timeout performed, Emergency Drugs available and Suction available Patient Re-evaluated:Patient Re-evaluated prior to induction Oxygen Delivery Method: Circle system utilized Preoxygenation: Pre-oxygenation with 100% oxygen Induction Type: IV induction Ventilation: Mask ventilation without difficulty Laryngoscope Size: Mac and 3 Grade View: Grade III Tube type: Oral Tube size: 7.0 mm Number of attempts: 1 Airway Equipment and Method: Stylet Placement Confirmation: ETT inserted through vocal cords under direct vision,  positive ETCO2 and breath sounds checked- equal and bilateral Secured at: 21 cm Tube secured with: Tape Dental Injury: Teeth and Oropharynx as per pre-operative assessment

## 2018-05-06 LAB — BASIC METABOLIC PANEL
Anion gap: 9 (ref 5–15)
BUN: 16 mg/dL (ref 8–23)
CO2: 27 mmol/L (ref 22–32)
Calcium: 7.7 mg/dL — ABNORMAL LOW (ref 8.9–10.3)
Chloride: 101 mmol/L (ref 98–111)
Creatinine, Ser: 0.59 mg/dL (ref 0.44–1.00)
GFR calc Af Amer: >60 mL/min (ref >60)
GFR calc non Af Amer: >60 mL/min (ref >60)
Glucose, Bld: 182 mg/dL — ABNORMAL HIGH (ref 70–99)
Potassium: 4.4 mmol/L (ref 3.5–5.1)
Sodium: 137 mmol/L (ref 135–145)

## 2018-05-06 LAB — GLUCOSE, CAPILLARY
Glucose-Capillary: 171 mg/dL — ABNORMAL HIGH (ref 70–99)
Glucose-Capillary: 165 mg/dL — ABNORMAL HIGH (ref 70–99)
Glucose-Capillary: 187 mg/dL — ABNORMAL HIGH (ref 70–99)
Glucose-Capillary: 125 mg/dL — ABNORMAL HIGH (ref 70–99)

## 2018-05-06 LAB — CBC
HCT: 31.8 % — ABNORMAL LOW (ref 36.0–46.0)
Hemoglobin: 9.7 g/dL — ABNORMAL LOW (ref 12.0–15.0)
MCH: 27.8 pg (ref 26.0–34.0)
MCHC: 30.5 g/dL (ref 30.0–36.0)
MCV: 91.1 fL (ref 80.0–100.0)
Platelets: 223 10*3/uL (ref 150–400)
RBC: 3.49 MIL/uL — ABNORMAL LOW (ref 3.87–5.11)
RDW: 13.2 % (ref 11.5–15.5)
WBC: 8.8 10*3/uL (ref 4.0–10.5)
nRBC: 0 % (ref 0.0–0.2)

## 2018-05-06 MED ORDER — ACETAMINOPHEN 500 MG PO TABS
500.0000 mg | ORAL_TABLET | ORAL | Status: DC | PRN
  Administered 2018-05-06 – 2018-05-08 (×3): 500 mg via ORAL
  Filled 2018-05-06 (×3): qty 1

## 2018-05-06 MED ORDER — OXYCODONE-ACETAMINOPHEN 7.5-325 MG PO TABS
1.0000 | ORAL_TABLET | ORAL | Status: DC | PRN
  Administered 2018-05-06 – 2018-05-11 (×7): 1 via ORAL
  Filled 2018-05-06 (×7): qty 1

## 2018-05-06 NOTE — Clinical Social Work Note (Signed)
Clinical Social Work Assessment  Patient Details  Name: Amy [name] MRN: [id number] Date of Birth: 1936/02/19  Date of referral:  05/06/18               Reason for consult:  Facility Placement                Permission sought to share information with:    Permission granted to share information::     Name::        Agency::     Relationship::     Contact Information:  daughter, Scheryl Marten  Housing/Transportation Living arrangements for the past 2 months:  Single Family Home Source of Information:  Adult Children Patient Interpreter Needed:  None Criminal Activity/Legal Involvement Pertinent to Current Situation/Hospitalization:  No - Comment as needed Significant Relationships:  Adult Children Lives with:  Self Do you feel safe going back to the place where you live?  Yes Need for family participation in patient care:  Yes (Comment)  Care giving concerns:  Patient receives assistance with ADLs.    Social Worker assessment / plan:  At baseline patient uses a cane and a walker. She feeds herself. She had back surgery in June and completed rehab.  Daughter, Leslye Peer, is agreeable to short term rehab at [facility].   Employment status:  Retired Nurse, adult PT Recommendations:  [place] / Referral to community resources:  [place]  Patient/Family's Response to care:  Patient and family are agreeable to SNF.   Patient/Family's Understanding of and Emotional Response to Diagnosis, Current Treatment, and Prognosis:  Patient and family understand patient's diagnosis, treatment and prognosis.   Emotional Assessment Appearance:  Appears stated age Attitude/Demeanor/Rapport:    Affect (typically observed):  Unable to Assess Orientation:  Oriented to Self, Oriented to Place, Oriented to  Time, Oriented to Situation Alcohol / Substance use:  Not Applicable Psych involvement (Current and /or in the community):  No  (Comment)  Discharge Needs  Concerns to be addressed:  Discharge Planning Concerns Readmission within the last 30 days:  No Current discharge risk:  None Barriers to Discharge:  Insurance Authorization   Ihor Gully, LCSW 05/06/2018, 1:49 PM

## 2018-05-06 NOTE — Anesthesia Postprocedure Evaluation (Signed)
Anesthesia Post Note  Patient: Amy [name]  Procedure(s) Performed: TOTAL KNEE ARTHROPLASTY (Right Knee)  Patient location during evaluation: Nursing Unit Anesthesia Type: General Post-procedure mental status: Patient sleepy, but oriented to self and place. Pain management: pain level controlled Vital Signs Assessment: post-procedure vital signs reviewed and stable Respiratory status: spontaneous breathing Cardiovascular status: stable Postop Assessment: no apparent nausea or vomiting Anesthetic complications: no     Last Vitals:  Vitals:   05/06/18 0211 05/06/18 0603  BP: 123/76 124/62  Pulse: 91 92  Resp:    Temp: 36.8 C 37.1 C  SpO2: 100% 99%    Last Pain:  Vitals:   05/06/18 0603  TempSrc: Oral  PainSc:                  Piers Baade A

## 2018-05-06 NOTE — Progress Notes (Signed)
Physical Therapy Treatment Patient Details Name: Amy [name] MRN: [id number] DOB: 04/27/1936 Today's Date: 05/06/2018  RIGHT KNEE ROM: 5-70 degrees CPM PROM: 0-60  degrees AMBULATION DISTANCE: 4 feet using RW with Mod assist  History of Present Illness Amy [name] is a 82 y/o female, s/p Right TKA, 05/05/18 with the diagnosis of Osteoarthritis right knee, history of recent L45, L5-S1 decompressive laminotomies and foraminotomies, L45, L5-S1 PLIF with autograft and T10-S1 PLA w/alografting and fixation 3 months ago    PT Comments    Patient present seated in chair (assisted by nursing staff), able to tolerate some exercises to right knee but c/o severe pain and apprehensive.  Patient limited for right knee end range flexion/extension due to pain/stiffness, requires less assistance for taking steps using RW, but limited to bedside due to fatigue and right knee pain.  Patient put back to bed and tolerated CPM 0-60 degrees.  Patient will benefit from continued physical therapy in hospital and recommended venue below to increase strength, balance, endurance for safe ADLs and gait.   Follow Up Recommendations  SNF     Equipment Recommendations  None recommended by PT    Recommendations for Other Services       Precautions / Restrictions Precautions Precautions: Fall Restrictions Weight Bearing Restrictions: Yes RLE Weight Bearing: Weight bearing as tolerated    Mobility  Bed Mobility Overal bed mobility: Needs Assistance Bed Mobility: Sit to Supine       Sit to supine: Mod assist;Max assist   General bed mobility comments: requires repeated verbal/tactile cueing to complete task  Transfers Overall transfer level: Needs assistance Equipment used: Rolling walker (2 wheeled) Transfers: Sit to/from [facility] Sit to Stand: Mod assist Stand pivot transfers: Mod assist       General transfer comment: slow labored  movement  Ambulation/Gait Ambulation/Gait assistance: Mod assist Gait Distance (Feet): 4 Feet Assistive device: Rolling walker (2 wheeled) Gait Pattern/deviations: Decreased step length - right;Decreased stance time - right;Decreased stride length Gait velocity: slow   General Gait Details: limted to 4-5 steps at bedside due to c/o severe pain right knee, poor endurance for taking steps   Stairs             Wheelchair Mobility    Modified Rankin (Stroke Patients Only)       Balance Overall balance assessment: Needs assistance Sitting-balance support: Feet supported;No upper extremity supported Sitting balance-Leahy Scale: Fair     Standing balance support: Bilateral upper extremity supported;During functional activity Standing balance-Leahy Scale: Fair Standing balance comment: using RW                            Cognition Arousal/Alertness: Awake/alert Behavior During Therapy: Anxious Overall Cognitive Status: Impaired/Different from baseline Area of Impairment: Following commands                       Following Commands: Follows one step commands inconsistently       General Comments: patient appears slightly confused when attempting to follow directions      Exercises Total Joint Exercises Ankle Circles/Pumps: Seated;AROM;Both;10 reps;Strengthening(seated with legs elevated) Quad Sets: Seated;AROM;Strengthening;Right;5 reps Short Arc Quad: Seated;AAROM;Strengthening;Right;10 reps(seated with legs elevated) Heel Slides: Seated;AAROM;Strengthening;Right;10 reps(seated with legs elevated) Goniometric ROM: RIGHT knee: 5-70 degrees    General Comments        Pertinent Vitals/Pain Pain Assessment: Faces Faces Pain Scale: Hurts whole lot Pain Location: right knee with movement  Pain Descriptors / Indicators: Sore;Sharp;Discomfort;Grimacing Pain Intervention(s): Limited activity within patient's tolerance;Monitored during session     Home Living                      Prior Function            PT Goals (current goals can now be found in the care plan section) Acute Rehab PT Goals Patient Stated Goal: return home after rehab PT Goal Formulation: With patient Time For Goal Achievement: 05/19/18 Potential to Achieve Goals: Good Progress towards PT goals: Progressing toward goals    Frequency    7X/week      PT Plan Current plan remains appropriate    Co-evaluation              AM-PAC PT "6 Clicks" Daily Activity  Outcome Measure  Difficulty turning over in bed (including adjusting bedclothes, sheets and blankets)?: A Lot Difficulty moving from lying on back to sitting on the side of the bed? : A Lot Difficulty sitting down on and standing up from a chair with arms (e.g., wheelchair, bedside commode, etc,.)?: A Lot Help needed moving to and from a bed to chair (including a wheelchair)?: A Lot Help needed walking in hospital room?: A Lot Help needed climbing 3-5 steps with a railing? : Total 6 Click Score: 11    End of Session   Activity Tolerance: Patient limited by fatigue;Patient limited by pain Patient left: in bed;with call bell/phone within reach;in CPM Nurse Communication: Mobility status PT Visit Diagnosis: Unsteadiness on feet (R26.81);Other abnormalities of gait and mobility (R26.89);Muscle weakness (generalized) (M62.81)     Time: 8110-3159 PT Time Calculation (min) (ACUTE ONLY): 24 min  Charges:  $Therapeutic Activity: 23-37 mins                     3:16 PM, 05/06/18 Lonell Grandchild, MPT Physical Therapist with [facility] [phone] office [phone] mobile phone

## 2018-05-06 NOTE — Addendum Note (Signed)
Addendum  created 05/06/18 1227 by Mickel Baas, CRNA   Sign clinical note

## 2018-05-06 NOTE — NC FL2 (Signed)
[place] LEVEL OF CARE SCREENING TOOL     IDENTIFICATION  Patient Name: Amy [name] Birthdate: 01/12/1936 Sex: female Admission Date (Current Location): 05/05/2018  [facility] and [place] Number:  [employer] and Address:  [place] [address] [place]      Provider Number: [id number]  Attending Physician Name and Address:  [name], MD  Relative Name and Phone Number:       Current Level of Care: Hospital Recommended Level of Care: [place] Prior Approval Number:    Date Approved/Denied:   PASRR Number:    Discharge Plan: SNF    Current Diagnoses: Patient Active Problem List   Diagnosis Date Noted  . Primary osteoarthritis of right knee   . Status post surgery 01/26/2018  . Surgery, elective 01/26/2018  . Degenerative spondylolisthesis 01/26/2018  . Osteoporosis 12/13/2016  . Osteopenia 11/26/2016  . Invasive ductal carcinoma of breast, female, right ([place])   . Lumbago 03/22/2014  . Stiffness of joint, not elsewhere classified, pelvic region and thigh 03/22/2014  . Muscle weakness (generalized) 03/22/2014  . Abnormality of gait 03/22/2014  . Depression with anxiety 09/03/2012  . Insomnia secondary to depression with anxiety 09/03/2012  . Pain 09/03/2012    Orientation RESPIRATION BLADDER Height & Weight     Self, Time, Situation, Place  Normal Continent Weight:   Height:     BEHAVIORAL SYMPTOMS/MOOD NEUROLOGICAL BOWEL NUTRITION STATUS      Continent Diet(carb modified)  AMBULATORY STATUS COMMUNICATION OF NEEDS Skin   Extensive Assist Verbally Surgical wounds(rt. knee)                       Personal Care Assistance Level of Assistance  Bathing, Feeding, Dressing Bathing Assistance: Limited assistance Feeding assistance: Independent Dressing Assistance: Limited assistance     Functional Limitations Info  Sight, Hearing, Speech Sight Info: Adequate Hearing Info:  Adequate Speech Info: Adequate    SPECIAL CARE FACTORS FREQUENCY  PT (By licensed PT)     PT Frequency: 5x/week              Contractures Contractures Info: Not present    Additional Factors Info  Code Status, Allergies, Psychotropic Code Status Info: Full Code Allergies Info: Cymbalta,  Psychotropic Info: Effexor, Lexapro, Xanax, Risperdal, Desyrel         Current Medications (05/06/2018):  This is the current hospital active medication list Current Facility-Administered Medications  Medication Dose Route Frequency Provider Last Rate Last Dose  . acetaminophen (TYLENOL) tablet 500 mg  500 mg Oral Q4H PRN [name], MD   500 mg at 05/06/18 9924  . alum & mag hydroxide-simeth (MAALOX/MYLANTA) 200-200-20 MG/5ML suspension 30 mL  30 mL Oral Q4H PRN [name], MD      . aspirin EC tablet 325 mg  325 mg Oral Q breakfast [name], MD   325 mg at 05/06/18 0937  . calcium carbonate (OS-CAL - dosed in mg of elemental calcium) tablet 500 mg of elemental calcium  500 mg of elemental calcium Oral Q breakfast [name], MD   500 mg of elemental calcium at 05/06/18 0936  . celecoxib (CELEBREX) capsule 200 mg  200 mg Oral Daily [name], MD   200 mg at 05/06/18 2683  . cholecalciferol (VITAMIN D) tablet 1,000 Units  1,000 Units Oral Daily [name], MD   1,000 Units at 05/06/18 [phone]  .  diphenhydrAMINE (BENADRYL) 12.5 MG/5ML elixir 12.5-25 mg  12.5-25 mg Oral Q4H PRN [name], MD      . docusate sodium (COLACE) capsule 100 mg  100 mg Oral BID [name], MD   100 mg at 05/06/18 0937  . escitalopram (LEXAPRO) tablet 20 mg  20 mg Oral Daily [name], MD   20 mg at 05/06/18 0936  . letrozole [facility]) tablet 2.5 mg  2.5 mg Oral Daily [name], MD   2.5 mg at 05/06/18 0936  . menthol-cetylpyridinium (CEPACOL) lozenge 3 mg  1 lozenge Oral PRN [name], MD       Or  . phenol  (CHLORASEPTIC) mouth spray 1 spray  1 spray Mouth/Throat PRN [name], MD      . metFORMIN (GLUCOPHAGE) tablet 500 mg  500 mg Oral BID WC [name], MD   500 mg at 05/06/18 0937  . methocarbamol (ROBAXIN) tablet 500 mg  500 mg Oral QID PRN [name], MD      . metoCLOPramide (REGLAN) tablet 5-10 mg  5-10 mg Oral Q8H PRN [name], MD       Or  . metoCLOPramide (REGLAN) injection 5-10 mg  5-10 mg Intravenous Q8H PRN [name], MD      . ondansetron [facility]) injection 4 mg  4 mg Intravenous Q6H [name], MD   4 mg at 05/06/18 1310  . ondansetron (ZOFRAN) tablet 4 mg  4 mg Oral Q6H PRN [name], MD       Or  . ondansetron [facility]) injection 4 mg  4 mg Intravenous Q6H PRN [name], MD      . oxyCODONE-acetaminophen (PERCOCET) 7.5-325 MG per tablet 1 tablet  1 tablet Oral Q4H PRN [name], MD      . risperiDONE (RISPERDAL) tablet 1 mg  1 mg Oral QHS [name], MD      . senna-docusate (Senokot-S) tablet 1 tablet  1 tablet Oral QHS PRN [name], MD      . traZODone (DESYREL) tablet 50 mg  50 mg Oral QHS [name], MD      . venlafaxine XR (EFFEXOR-XR) 24 hr capsule 150 mg  150 mg Oral BID [name], MD   150 mg at 05/06/18 2820     Discharge Medications: Please see discharge summary for a list of discharge medications.  Relevant Imaging Results:  Relevant Lab Results:   Additional Information SS#: [id number] Walid Haig D, LCSW

## 2018-05-06 NOTE — Progress Notes (Signed)
Subjective: 1 Day Post-Op Procedure(s) (LRB): TOTAL KNEE ARTHROPLASTY (Right)   Objective: Vital signs in last 24 hours: Temp:  [97.8 F (36.6 C)-98.8 F (37.1 C)] 98.8 F (37.1 C) (10/09 0603) Pulse Rate:  [87-102] 92 (10/09 0603) Resp:  [14-19] 16 (10/08 1803) BP: (119-197)/(42-97) 124/62 (10/09 0603) SpO2:  [92 %-100 %] 99 % (10/09 0603)  Intake/Output from previous day: 10/08 0701 - 10/09 0700 In: 3573.9 [P.O.:240; I.V.:3233.9; IV Piggyback:100] Out: 720 [Urine:670; Blood:50] Intake/Output this shift: No intake/output data recorded.  Recent Labs    05/06/18 0538  HGB 9.7*   Recent Labs    05/06/18 0538  WBC 8.8  RBC 3.49*  HCT 31.8*  PLT 223   Recent Labs    05/06/18 0538  NA 137  K 4.4  CL 101  CO2 27  BUN 16  CREATININE 0.59  GLUCOSE 182*  CALCIUM 7.7*   Assessment/Plan: 1 Day Post-Op Procedure(s) (LRB): TOTAL KNEE ARTHROPLASTY (Right)   Extremely sedate, nurses note that patient did not wake up until about 4 PM yesterday.  Still somewhat sedate.  She did recognize me this morning she was somnolent but easily arousable.  Decrease all sedated medication use Tylenol for pain until patient's mental status improves    Arther Abbott 05/06/2018, 8:00 AM

## 2018-05-07 ENCOUNTER — Encounter (HOSPITAL_COMMUNITY): Payer: Self-pay | Admitting: Orthopedic Surgery

## 2018-05-07 LAB — CBC
HCT: 25 % — ABNORMAL LOW (ref 36.0–46.0)
Hemoglobin: 7.8 g/dL — ABNORMAL LOW (ref 12.0–15.0)
MCH: 28.1 pg (ref 26.0–34.0)
MCHC: 31.2 g/dL (ref 30.0–36.0)
MCV: 89.9 fL (ref 80.0–100.0)
Platelets: 166 10*3/uL (ref 150–400)
RBC: 2.78 MIL/uL — ABNORMAL LOW (ref 3.87–5.11)
RDW: 13.2 % (ref 11.5–15.5)
WBC: 6.9 10*3/uL (ref 4.0–10.5)
nRBC: 0 % (ref 0.0–0.2)

## 2018-05-07 LAB — GLUCOSE, CAPILLARY
Glucose-Capillary: 149 mg/dL — ABNORMAL HIGH (ref 70–99)
Glucose-Capillary: 145 mg/dL — ABNORMAL HIGH (ref 70–99)
Glucose-Capillary: 115 mg/dL — ABNORMAL HIGH (ref 70–99)
Glucose-Capillary: 104 mg/dL — ABNORMAL HIGH (ref 70–99)

## 2018-05-07 MED ORDER — SODIUM CHLORIDE 0.9% IV SOLUTION
Freq: Once | INTRAVENOUS | Status: AC
  Administered 2018-05-07: 18:00:00 via INTRAVENOUS

## 2018-05-07 NOTE — Progress Notes (Signed)
Patient ID: Amy [name], female   DOB: 1936-03-08, 82 y.o.   MRN: [id number] Pod 2   confusued   BP (!) 131/57   Pulse (!) 102   Temp 98.6 F (37 C) (Oral)   Resp 18   SpO2 (!) 89%   CBC Latest Ref Rng & Units 05/07/2018 05/06/2018 05/01/2018  WBC 4.0 - 10.5 K/uL 6.9 8.8 7.9  Hemoglobin 12.0 - 15.0 g/dL 7.8(L) 9.7(L) 13.7  Hematocrit 36.0 - 46.0 % 25.0(L) 31.8(L) 42.0  Platelets 150 - 400 K/uL 166 223 258    BMP Latest Ref Rng & Units 05/06/2018 05/01/2018 02/01/2018  Glucose 70 - 99 mg/dL 182(H) 137(H) 123(H)  BUN 8 - 23 mg/dL 16 20 13   Creatinine 0.44 - 1.00 mg/dL 0.59 0.71 0.77  Sodium 135 - 145 mmol/L 137 140 134(L)  Potassium 3.5 - 5.1 mmol/L 4.4 4.2 3.6  Chloride 98 - 111 mmol/L 101 103 97(L)  CO2 22 - 32 mmol/L 27 28 27   Calcium 8.9 - 10.3 mg/dL 7.7(L) 9.0 8.9    Transfuse 2 units tonight for acute blood loss anemia

## 2018-05-07 NOTE — Progress Notes (Signed)
Physical Therapy Treatment Patient Details Name: Amy [name] MRN: [id number] DOB: [birth year] Today's Date: 05/07/2018  RIGHT KNEE ROM:3-88 degrees CPM PROM: 0-60 degrees AMBULATION DISTANCE:3feet using RW with Min/Mod assist x 2 trials  History of Present Illness Amy [name] is a 82 y/o female, s/p Right TKA, 05/05/18 with the diagnosis of Osteoarthritis right knee, history of recent L45, L5-S1 decompressive laminotomies and foraminotomies, L45, L5-S1 PLIF with autograft and T10-S1 PLA w/alografting and fixation 3 months ago    PT Comments    Patient presents seated in chair (assisted by nursing staff), agreeable and motivated for therapy.  Patient demonstrates increased BLE strength for sit to stands, transfers and gait training.   Patient able to ambulate outside of room demonstrating slow labored gait with fair return for right heel to toe stepping, followed with w/c for safety and required a 4-5 minute sitting rest break for ambulating back to bedside.  Patient able to achieve up to 88 degrees right knee flexion using her LLE self stretching and tolerated staying up in chair with RLE dangling after therapy - RN notified.  Patient will benefit from continued physical therapy in hospital and recommended venue below to increase strength, balance, endurance for safe ADLs and gait.   Follow Up Recommendations  SNF     Equipment Recommendations  None recommended by PT    Recommendations for Other Services       Precautions / Restrictions Precautions Precautions: Fall Restrictions Weight Bearing Restrictions: Yes RLE Weight Bearing: Weight bearing as tolerated    Mobility  Bed Mobility               General bed mobility comments: Patient presents up in chair  (assisted by nursing staff)  Transfers Overall transfer level: Needs assistance Equipment used: Rolling walker (2 wheeled) Transfers: Sit to/from [facility] Sit to Stand: Min  assist Stand pivot transfers: Min assist;Mod assist       General transfer comment: increased BLE strength for sit to stands  Ambulation/Gait Ambulation/Gait assistance: Min assist;Mod assist Gait Distance (Feet): 20 Feet Assistive device: Rolling walker (2 wheeled) Gait Pattern/deviations: Decreased step length - right;Decreased stance time - right;Decreased step length - left;Decreased stride length Gait velocity: slow   General Gait Details: demonstrates increased endurance/distance for ambulation with slow labored cadence, followed with w/c for safety, limited secondary to fatigue, had to take a sitting rest break before ambulating back to bedside   Stairs             Wheelchair Mobility    Modified Rankin (Stroke Patients Only)       Balance Overall balance assessment: Needs assistance Sitting-balance support: Feet supported;No upper extremity supported Sitting balance-Leahy Scale: Fair     Standing balance support: Bilateral upper extremity supported;During functional activity Standing balance-Leahy Scale: Fair Standing balance comment: using RW                            Cognition Arousal/Alertness: Awake/alert Behavior During Therapy: WFL for tasks assessed/performed Overall Cognitive Status: Within Functional Limits for tasks assessed                                 General Comments: Patient presents more alert and able follow directions consistently with occasional repeated verbal cueing      Exercises Total Joint Exercises Ankle Circles/Pumps: Seated;AROM;Both;10 reps;Strengthening Quad Sets: Seated;AROM;Strengthening;Right;5 reps Heel Slides:  Seated;AAROM;Strengthening;Right;10 reps Knee Flexion: Seated;AAROM;Right(seated self stretching right knee using LLE x 4, 30 second holds) Goniometric ROM: Right knee: 3-88 degrees    General Comments        Pertinent Vitals/Pain Pain Assessment: Faces Faces Pain Scale: Hurts  even more Pain Location: right knee with movement Pain Descriptors / Indicators: Sore;Sharp;Discomfort;Grimacing    Home Living                      Prior Function            PT Goals (current goals can now be found in the care plan section) Acute Rehab PT Goals Patient Stated Goal: return home after rehab PT Goal Formulation: With patient Time For Goal Achievement: 05/19/18 Potential to Achieve Goals: Good Progress towards PT goals: Progressing toward goals    Frequency    7X/week      PT Plan Current plan remains appropriate    Co-evaluation              AM-PAC PT "6 Clicks" Daily Activity  Outcome Measure  Difficulty turning over in bed (including adjusting bedclothes, sheets and blankets)?: A Lot Difficulty moving from lying on back to sitting on the side of the bed? : A Lot Difficulty sitting down on and standing up from a chair with arms (e.g., wheelchair, bedside commode, etc,.)?: A Little Help needed moving to and from a bed to chair (including a wheelchair)?: A Little Help needed walking in hospital room?: A Lot Help needed climbing 3-5 steps with a railing? : Total 6 Click Score: 13    End of Session Equipment Utilized During Treatment: Gait belt(followed with w/c for safety) Activity Tolerance: Patient tolerated treatment well Patient left: in bed;with call bell/phone within reach;with chair alarm set Nurse Communication: Mobility status PT Visit Diagnosis: Unsteadiness on feet (R26.81);Other abnormalities of gait and mobility (R26.89);Muscle weakness (generalized) (M62.81)     Time: 8182-9937 PT Time Calculation (min) (ACUTE ONLY): 35 min  Charges:  $Gait Training: 8-22 mins $Therapeutic Exercise: 8-22 mins                     2:30 PM, 05/07/18 Lonell Grandchild, MPT Physical Therapist with [facility] [phone] office [phone] mobile phone

## 2018-05-07 NOTE — Care Management Note (Signed)
Case Management Note  Patient Details  Name: Amy [name] MRN: [id number] Date of Birth: [birth year]  Subjective/Objective:         TKA.            Action/Plan: Recommended for SNF. CSW making arrangements. CM will sign off.   Expected Discharge Date:     unk             Expected Discharge Plan:  Skilled Nursing Facility  In-House Referral:  Clinical Social Work  Discharge planning Services  CM Consult  Post Acute Care Choice:  NA Choice offered to:  NA  DME Arranged:    DME Agency:     HH Arranged:    [place] Agency:     Status of Service:  Completed, signed off  If discussed at [employer], dates discussed:    Additional Comments:  Tilmon Wisehart, Chauncey Reading, RN 05/07/2018, 1:37 PM

## 2018-05-08 LAB — GLUCOSE, CAPILLARY
Glucose-Capillary: 124 mg/dL — ABNORMAL HIGH (ref 70–99)
Glucose-Capillary: 130 mg/dL — ABNORMAL HIGH (ref 70–99)
Glucose-Capillary: 148 mg/dL — ABNORMAL HIGH (ref 70–99)
Glucose-Capillary: 108 mg/dL — ABNORMAL HIGH (ref 70–99)
Glucose-Capillary: 136 mg/dL — ABNORMAL HIGH (ref 70–99)

## 2018-05-08 LAB — CBC
HCT: 32.1 % — ABNORMAL LOW (ref 36.0–46.0)
Hemoglobin: 10.5 g/dL — ABNORMAL LOW (ref 12.0–15.0)
MCH: 28.9 pg (ref 26.0–34.0)
MCHC: 32.7 g/dL (ref 30.0–36.0)
MCV: 88.4 fL (ref 80.0–100.0)
Platelets: 163 10*3/uL (ref 150–400)
RBC: 3.63 MIL/uL — ABNORMAL LOW (ref 3.87–5.11)
RDW: 13.2 % (ref 11.5–15.5)
WBC: 6.4 10*3/uL (ref 4.0–10.5)
nRBC: 0 % (ref 0.0–0.2)

## 2018-05-08 MED ORDER — ASPIRIN 325 MG PO TBEC: 325.0000 mg | DELAYED_RELEASE_TABLET | Freq: Every day | ORAL | 0 refills | Status: DC

## 2018-05-08 MED ORDER — RISPERIDONE 1 MG PO TABS: 1.0000 mg | ORAL_TABLET | Freq: Every day | ORAL | 2 refills | Status: DC

## 2018-05-08 MED ORDER — TRAZODONE HCL 50 MG PO TABS: 50.0000 mg | ORAL_TABLET | Freq: Every day | ORAL | 2 refills | Status: DC

## 2018-05-08 MED ORDER — DOCUSATE SODIUM 100 MG PO CAPS: 100.0000 mg | ORAL_CAPSULE | Freq: Two times a day (BID) | ORAL | 0 refills | Status: DC

## 2018-05-08 MED ORDER — ALPRAZOLAM 0.5 MG PO TABS: 0.5000 mg | ORAL_TABLET | Freq: Every day | ORAL | 0 refills | Status: DC

## 2018-05-08 MED ORDER — OXYCODONE-ACETAMINOPHEN 7.5-325 MG PO TABS: 1.0000 | ORAL_TABLET | Freq: Two times a day (BID) | ORAL | 0 refills | Status: DC

## 2018-05-08 NOTE — Progress Notes (Signed)
Called the son Roderic Palau to let him know that his mother would not be going to [facility] center today. It may be tomorrow or Monday

## 2018-05-08 NOTE — Progress Notes (Signed)
Physical Therapy Treatment Patient Details Name: Amy [name] MRN: [id number] DOB: Jul 10, 1936 Today's Date: 05/08/2018  RIGHT KNEE ROM:3-88degrees CPM PROM: 0-60 degrees AMBULATION DISTANCE:40 feet using RW with Min/assist  History of Present Illness Amy [name] is a 82 y/o female, s/p Right TKA, 05/05/18 with the diagnosis of Osteoarthritis right knee, history of recent L45, L5-S1 decompressive laminotomies and foraminotomies, L45, L5-S1 PLIF with autograft and T10-S1 PLA w/alografting and fixation 3 months ago    PT Comments    Patient requires less assistance for supine to sitting, sit to stands and transfers, increased endurance/distance for gait training without loss of balance, followed with w/c for safety, but did not have to sit, able to ambulate into hallway and back to bedside with slow labored cadence, fair return for left heel to toe stepping, limited mostly due to c/o right knee pain.  Patient tolerated sitting up in chair after therapy - RN notified.  Patient will benefit from continued physical therapy in hospital and recommended venue below to increase strength, balance, endurance for safe ADLs and gait.   Follow Up Recommendations  SNF     Equipment Recommendations  None recommended by PT    Recommendations for Other Services       Precautions / Restrictions Precautions Precautions: Fall Restrictions Weight Bearing Restrictions: Yes RLE Weight Bearing: Weight bearing as tolerated    Mobility  Bed Mobility Overal bed mobility: Needs Assistance Bed Mobility: Supine to Sit     Supine to sit: Min assist     General bed mobility comments: demonstrates improvement for using BUE for supine to sitting  Transfers Overall transfer level: Needs assistance Equipment used: Rolling walker (2 wheeled) Transfers: Sit to/from [facility] Sit to Stand: Min assist Stand pivot transfers: Min assist       General transfer comment:  increased BLE strength for sit to stands and transfers  Ambulation/Gait Ambulation/Gait assistance: Min assist Gait Distance (Feet): 40 Feet Assistive device: Rolling walker (2 wheeled) Gait Pattern/deviations: Decreased step length - right;Decreased stance time - right;Decreased step length - left;Decreased stride length Gait velocity: slow   General Gait Details: demonstrates increased endurance/distance for ambulation with slow labored cadence, followed with w/c for safety, limited secondary to fatigue, able to ambulate into hallway and back to bedside without requring to sit down in w/c   Stairs             Wheelchair Mobility    Modified Rankin (Stroke Patients Only)       Balance Overall balance assessment: Needs assistance Sitting-balance support: Feet supported;No upper extremity supported Sitting balance-Leahy Scale: Good     Standing balance support: Bilateral upper extremity supported;During functional activity Standing balance-Leahy Scale: Fair Standing balance comment: using RW                            Cognition Arousal/Alertness: Awake/alert Behavior During Therapy: WFL for tasks assessed/performed Overall Cognitive Status: Within Functional Limits for tasks assessed                                 General Comments: Patient slightly confused, but able to participate with therapy appropriately       Exercises Total Joint Exercises Ankle Circles/Pumps: Seated;AROM;Both;10 reps;Strengthening Quad Sets: Seated;AROM;Strengthening;Right;5 reps Short Arc Quad: Seated;AAROM;Strengthening;Right;10 reps Heel Slides: Seated;AAROM;Strengthening;Right;10 reps Goniometric ROM: Right knee: 3-88 degrees    General Comments  Pertinent Vitals/Pain Pain Assessment: Faces Faces Pain Scale: Hurts little more Pain Location: right knee with movement Pain Descriptors / Indicators: Sore;Sharp;Discomfort;Grimacing Pain  Intervention(s): Limited activity within patient's tolerance;Monitored during session    Home Living                      Prior Function            PT Goals (current goals can now be found in the care plan section) Acute Rehab PT Goals Patient Stated Goal: return home after rehab PT Goal Formulation: With patient Time For Goal Achievement: 05/19/18 Potential to Achieve Goals: Good Progress towards PT goals: Progressing toward goals    Frequency    7X/week      PT Plan Current plan remains appropriate    Co-evaluation              AM-PAC PT "6 Clicks" Daily Activity  Outcome Measure  Difficulty turning over in bed (including adjusting bedclothes, sheets and blankets)?: A Little Difficulty moving from lying on back to sitting on the side of the bed? : A Little Difficulty sitting down on and standing up from a chair with arms (e.g., wheelchair, bedside commode, etc,.)?: A Little Help needed moving to and from a bed to chair (including a wheelchair)?: A Little Help needed walking in hospital room?: A Lot Help needed climbing 3-5 steps with a railing? : A Lot 6 Click Score: 16    End of Session Equipment Utilized During Treatment: Gait belt(followed with w/c for safety) Activity Tolerance: Patient tolerated treatment well;Patient limited by fatigue;Patient limited by pain Patient left: in chair;with call bell/phone within reach;with chair alarm set Nurse Communication: Mobility status PT Visit Diagnosis: Unsteadiness on feet (R26.81);Other abnormalities of gait and mobility (R26.89);Muscle weakness (generalized) (M62.81)     Time: 2263-3354 PT Time Calculation (min) (ACUTE ONLY): 25 min  Charges:  $Gait Training: 8-22 mins $Therapeutic Exercise: 8-22 mins                     12:28 PM, 05/08/18 Lonell Grandchild, MPT Physical Therapist with [facility] 336 [phone] office [phone] mobile phone

## 2018-05-08 NOTE — Clinical Social Work Note (Signed)
PASARR is [id number] E 05/08/18-06/07/18.   Thresa Dozier, Clydene Pugh, LCSW

## 2018-05-08 NOTE — Clinical Social Work Note (Addendum)
Patient has a bed at [facility]. [facility] is waiting for [facility] authorization.  Patient is also awaiting PASARR number. 30 day note has been left on department 300 nurses stated for attending signature. Attending aware.     Nabilah Davoli, Clydene Pugh, LCSW

## 2018-05-08 NOTE — Discharge Summary (Addendum)
Physician Discharge Summary  Patient ID: Amy [name] MRN: [id number] DOB/AGE: 1936/01/15 82 y.o.  Admit date: 05/05/2018 Discharge date: 05/11/2018  Admission Diagnoses: Primary osteoarthritis right knee  Discharge Diagnoses: same Active Problems:   Primary osteoarthritis of right knee Acute blood loss anemia postoperatively Postoperative confusion  Discharged Condition: stable  Hospital Course:  October 8 right total knee arthroplasty: [employer] posterior stabilized implant tolerated well without complications October 6-76 the patient had periods of postoperative confusion acute blood loss anemia requiring transfusion of 2 units of blood.  She was able to ambulate with physical therapy range of motion reached 88 degrees.  CBC Latest Ref Rng & Units 05/08/2018 05/07/2018 05/06/2018  WBC 4.0 - 10.5 K/uL 6.4 6.9 8.8  Hemoglobin 12.0 - 15.0 g/dL 10.5(L) 7.8(L) 9.7(L)  Hematocrit 36.0 - 46.0 % 32.1(L) 25.0(L) 31.8(L)  Platelets 150 - 400 K/uL 163 166 223   BMP Latest Ref Rng & Units 05/06/2018 05/01/2018 02/01/2018  Glucose 70 - 99 mg/dL 182(H) 137(H) 123(H)  BUN 8 - 23 mg/dL 16 20 13   Creatinine 0.44 - 1.00 mg/dL 0.59 0.71 0.77  Sodium 135 - 145 mmol/L 137 140 134(L)  Potassium 3.5 - 5.1 mmol/L 4.4 4.2 3.6  Chloride 98 - 111 mmol/L 101 103 97(L)  CO2 22 - 32 mmol/L 27 28 27   Calcium 8.9 - 10.3 mg/dL 7.7(L) 9.0 8.9    Discharge Exam: Blood pressure 112/78, pulse 87, temperature 98.4 F (36.9 C), temperature source Oral, resp. rate 16, SpO2 98 %. Again periods of postoperative confusion seems to wax and wane depending on opioid load.  She is opioid tolerant secondary to prior surgeries and Percocet required for pain relief  Disposition: Discharge disposition: 03-Skilled [place]       Discharge Instructions    Call MD / Call 911   Complete by:  As directed    If you experience chest pain or shortness of breath, CALL 911 and be transported to the hospital  emergency room.  If you develope a fever above 101 F, pus (white drainage) or increased drainage or redness at the wound, or calf pain, call your surgeon's office.   Constipation Prevention   Complete by:  As directed    Drink plenty of fluids.  Prune juice may be helpful.  You may use a stool softener, such as Colace (over the counter) 100 mg twice a day.  Use MiraLax (over the counter) for constipation as needed.   Diet - low sodium heart healthy   Complete by:  As directed    Discharge instructions   Complete by:  As directed    Apply bone foam to heel 3 x a day for 30 minutes   cpm machine 3 x a day for 2 hrs; start 0-80 increase 10 per day until 110 degrees is reached   Change dressing tues and daily as needed   Remove staples pod 14   Do not put a pillow under the knee. Place it under the heel.   Complete by:  As directed    Increase activity slowly as tolerated   Complete by:  As directed      Allergies as of 05/11/2018      Reactions   Cymbalta [duloxetine Hcl] Other (See Comments)   Hallucinated and couldn't think; got worse and worse the longer she took this.      Medication List    TAKE these medications   ALPRAZolam 0.5 MG tablet Commonly known as:  XANAX Take  1 tablet (0.5 mg total) by mouth at bedtime.   aspirin 325 MG EC tablet Take 1 tablet (325 mg total) by mouth daily with breakfast.   calcium carbonate 1500 (600 Ca) MG Tabs tablet Commonly known as:  OSCAL Take 600 mg of elemental calcium by mouth daily.   VITAMIN D PO Take 1 capsule by mouth daily.   cholecalciferol 1000 units tablet Commonly known as:  VITAMIN D Take 1 tablet (1,000 Units total) by mouth daily.   docusate sodium 100 MG capsule Commonly known as:  COLACE Take 1 capsule (100 mg total) by mouth 2 (two) times daily.   escitalopram 20 MG tablet Commonly known as:  LEXAPRO Take 1 tablet (20 mg total) by mouth daily.   gabapentin 300 MG capsule Commonly known as:  NEURONTIN Take  300 mg by mouth at bedtime.   ibuprofen 200 MG tablet Commonly known as:  ADVIL,MOTRIN Take 200-400 mg by mouth every 6 (six) hours as needed for mild pain.   letrozole 2.5 MG tablet Commonly known as:  FEMARA Take 1 tablet (2.5 mg total) by mouth daily.   metFORMIN 500 MG tablet Commonly known as:  GLUCOPHAGE Take 500 mg by mouth 2 (two) times daily with a meal.   methocarbamol 500 MG tablet Commonly known as:  ROBAXIN Take 500 mg by mouth 4 (four) times daily as needed for muscle spasms.   oxyCODONE-acetaminophen 7.5-325 MG tablet Commonly known as:  PERCOCET Take 1 tablet by mouth 2 (two) times daily.   risperiDONE 1 MG tablet Commonly known as:  RISPERDAL Take 1 tablet (1 mg total) by mouth at bedtime.   traZODone 50 MG tablet Commonly known as:  DESYREL Take 1 tablet (50 mg total) by mouth at bedtime.   valACYclovir 500 MG tablet Commonly known as:  VALTREX Take 1,000 mg by mouth See admin instructions. Take 2 tablets (1000 mg) by mouth daily for 3 days as needed for outbreaks.   venlafaxine XR 150 MG 24 hr capsule Commonly known as:  EFFEXOR-XR Take 1 capsule (150 mg total) by mouth 2 (two) times daily.       Contact information for follow-up providers    [name], MD Follow up on 05/20/2018.   Specialties:  Orthopedic Surgery, Radiology Contact information: [address] [place] [address] [phone]            Contact information for after-discharge care    [facility] .   Service:  Skilled Nursing Contact information: [address] [place] [phone]                  Signed: Arther Abbott 05/11/2018, 12:45 PM

## 2018-05-08 NOTE — Care Management Important Message (Signed)
Important Message  Patient Details  Name: Amy [name] MRN: [id number] Date of Birth: Apr 17, 1936   Medicare Important Message Given:  Yes    [name] 05/08/2018, 10:39 AM

## 2018-05-08 NOTE — Discharge Instructions (Signed)
CPM machine 3 x a day for 2 hrs; start 0-80 increase 10 per day until 110 degrees is reached   Change dressing tues and daily as needed   Remove staples pod 14

## 2018-05-08 NOTE — Care Management (Signed)
Tim of [employer] (had been given referral for home health) notified that patient will be going to SNF.

## 2018-05-09 LAB — GLUCOSE, CAPILLARY
Glucose-Capillary: 130 mg/dL — ABNORMAL HIGH (ref 70–99)
Glucose-Capillary: 118 mg/dL — ABNORMAL HIGH (ref 70–99)
Glucose-Capillary: 97 mg/dL (ref 70–99)

## 2018-05-09 NOTE — Progress Notes (Signed)
Physical Therapy Treatment Patient Details Name: Amy [name] MRN: [id number] DOB: 01-31-1936 Today's Date: 05/09/2018 ROM 5-50  Gt:  25 ft    History of Present Illness DEANZA UPPERMAN is a 82 y/o female, s/p Right TKA, 05/05/18 with the diagnosis of Osteoarthritis right knee, history of recent L45, L5-S1 decompressive laminotomies and foraminotomies, L45, L5-S1 PLIF with autograft and T10-S1 PLA w/alografting and fixation 3 months ago    PT Comments    Pt does well with exercises but is still having significant pain which limits ambulation ability.  Pt current D/C plans remain appropriate as pt lives by herself.    Follow Up Recommendations  SNF     Equipment Recommendations  None recommended by PT    Recommendations for Other Services   OT    Precautions / Restrictions Precautions Precautions: None Restrictions Weight Bearing Restrictions: No RLE Weight Bearing: Weight bearing as tolerated    Mobility  Bed Mobility Overal bed mobility: Needs Assistance Bed Mobility: Supine to Sit     Supine to sit: Supervision        Transfers Overall transfer level: Needs assistance Equipment used: Rolling walker (2 wheeled) Transfers: Sit to/from [employer] Transfers Sit to Stand: Mod assist Stand pivot transfers: Min assist;Supervision          Ambulation/Gait Ambulation/Gait assistance: Min guard Gait Distance (Feet): 25 Feet Assistive device: Rolling walker (2 wheeled) Gait Pattern/deviations: Decreased step length - right;Decreased stance time - right;Decreased step length - left;Decreased stride length Gait velocity: slow   General Gait Details: demonstrates increased endurance/distance for ambulation with slow labored cadence, followed with w/c for safety, limited secondary to fatigue, able to ambulate into hallway and back to bedside without requring to sit down in w/c         Cognition Arousal/Alertness: Awake/alert Behavior During Therapy:  WFL for tasks assessed/performed Overall Cognitive Status: Within Functional Limits for tasks assessed                                 General Comments: Patient slightly confused, but able to participate with therapy appropriately       Exercises Total Joint Exercises Ankle Circles/Pumps: AROM;Both;10 reps;Strengthening;Supine Quad Sets: AROM;Strengthening;Supine;Both Gluteal Sets: Both;10 reps Heel Slides: AAROM;Strengthening;Right;10 reps;Supine Hip ABduction/ADduction: 10 reps;Right;Supine Straight Leg Raises: Right;Supine;10 reps    General Comments        Pertinent Vitals/Pain Pain Assessment: 0-10 Pain Score: 7  Pain Location: Rt knee when walking  Pain Descriptors / Indicators: Aching Pain Intervention(s): Limited activity within patient's tolerance           PT Goals (current goals can now be found in the care plan section) Acute Rehab PT Goals Patient Stated Goal: return home after rehab PT Goal Formulation: With patient Time For Goal Achievement: 05/19/18 Potential to Achieve Goals: Good Progress towards PT goals: Progressing toward goals    Frequency    5X/week      PT Plan Current plan remains appropriate     Activity  Outcome Measure    End of Session Equipment Utilized During Treatment: Gait belt(followed with w/c for safety) Activity Tolerance: Patient limited by fatigue;Patient limited by pain Patient left: in chair;with call bell/phone within reach Nurse Communication: Mobility status PT Visit Diagnosis: Unsteadiness on feet (R26.81);Other abnormalities of gait and mobility (R26.89);Muscle weakness (generalized) (M62.81)     Time: 3903-0092 PT Time Calculation (min) (ACUTE ONLY): 25 min  Charges:  $  Gait Training: 8-22 mins $Therapeutic Exercise: 8-22 mins                       Rayetta Humphrey, PT CLT [phone] 05/09/2018, 11:21 AM

## 2018-05-09 NOTE — Progress Notes (Signed)
CSW acknowledging discharge to SNF. Patient has not yet received authorization from [facility] to transfer to SNF at this time, and [employer] offices are closed over the weekend. CSW to follow up on Monday.  Laveda Abbe, [place] Clinical Social Worker [phone]

## 2018-05-10 LAB — BPAM RBC
Blood Product Expiration Date: 201911072359
Blood Product Expiration Date: 201911072359
ISSUE DATE / TIME: 201910102320
ISSUE DATE / TIME: 201910110306
Unit Type and Rh: 5100
Unit Type and Rh: 5100

## 2018-05-10 LAB — GLUCOSE, CAPILLARY
Glucose-Capillary: 141 mg/dL — ABNORMAL HIGH (ref 70–99)
Glucose-Capillary: 103 mg/dL — ABNORMAL HIGH (ref 70–99)
Glucose-Capillary: 83 mg/dL (ref 70–99)

## 2018-05-10 LAB — TYPE AND SCREEN
ABO/RH(D): O POS
Antibody Screen: NEGATIVE
Unit division: 0
Unit division: 0

## 2018-05-11 ENCOUNTER — Inpatient Hospital Stay
Admission: RE | Admit: 2018-05-11 | Discharge: 2018-06-01 | Disposition: A | Discharge status: Home / Self Care | Payer: Medicare HMO | Source: Ambulatory Visit | Attending: Internal Medicine | Admitting: Internal Medicine

## 2018-05-11 DIAGNOSIS — M4156 Other secondary scoliosis, lumbar region: Secondary | ICD-10-CM | POA: Diagnosis not present

## 2018-05-11 DIAGNOSIS — E119 Type 2 diabetes mellitus without complications: Secondary | ICD-10-CM | POA: Diagnosis not present

## 2018-05-11 DIAGNOSIS — Z853 Personal history of malignant neoplasm of breast: Secondary | ICD-10-CM | POA: Diagnosis not present

## 2018-05-11 DIAGNOSIS — F418 Other specified anxiety disorders: Secondary | ICD-10-CM | POA: Diagnosis not present

## 2018-05-11 DIAGNOSIS — Z7984 Long term (current) use of oral hypoglycemic drugs: Secondary | ICD-10-CM | POA: Diagnosis not present

## 2018-05-11 DIAGNOSIS — M4326 Fusion of spine, lumbar region: Secondary | ICD-10-CM | POA: Diagnosis not present

## 2018-05-11 DIAGNOSIS — Z87891 Personal history of nicotine dependence: Secondary | ICD-10-CM | POA: Diagnosis not present

## 2018-05-11 DIAGNOSIS — M6281 Muscle weakness (generalized): Secondary | ICD-10-CM | POA: Diagnosis not present

## 2018-05-11 DIAGNOSIS — M7989 Other specified soft tissue disorders: Secondary | ICD-10-CM | POA: Diagnosis not present

## 2018-05-11 DIAGNOSIS — D649 Anemia, unspecified: Secondary | ICD-10-CM | POA: Diagnosis not present

## 2018-05-11 DIAGNOSIS — Z170 Estrogen receptor positive status [ER+]: Secondary | ICD-10-CM | POA: Diagnosis not present

## 2018-05-11 DIAGNOSIS — C50911 Malignant neoplasm of unspecified site of right female breast: Secondary | ICD-10-CM | POA: Diagnosis not present

## 2018-05-11 DIAGNOSIS — Z96651 Presence of right artificial knee joint: Secondary | ICD-10-CM | POA: Diagnosis not present

## 2018-05-11 DIAGNOSIS — M79604 Pain in right leg: Secondary | ICD-10-CM | POA: Diagnosis not present

## 2018-05-11 DIAGNOSIS — Z7982 Long term (current) use of aspirin: Secondary | ICD-10-CM | POA: Diagnosis not present

## 2018-05-11 DIAGNOSIS — E785 Hyperlipidemia, unspecified: Secondary | ICD-10-CM | POA: Diagnosis not present

## 2018-05-11 DIAGNOSIS — Z471 Aftercare following joint replacement surgery: Principal | ICD-10-CM

## 2018-05-11 DIAGNOSIS — R2689 Other abnormalities of gait and mobility: Secondary | ICD-10-CM | POA: Diagnosis not present

## 2018-05-11 DIAGNOSIS — D62 Acute posthemorrhagic anemia: Secondary | ICD-10-CM | POA: Diagnosis not present

## 2018-05-11 DIAGNOSIS — M1990 Unspecified osteoarthritis, unspecified site: Secondary | ICD-10-CM | POA: Diagnosis not present

## 2018-05-11 DIAGNOSIS — Z79899 Other long term (current) drug therapy: Secondary | ICD-10-CM | POA: Diagnosis not present

## 2018-05-11 DIAGNOSIS — M1711 Unilateral primary osteoarthritis, right knee: Secondary | ICD-10-CM | POA: Diagnosis not present

## 2018-05-11 DIAGNOSIS — M8580 Other specified disorders of bone density and structure, unspecified site: Secondary | ICD-10-CM | POA: Diagnosis not present

## 2018-05-11 DIAGNOSIS — R600 Localized edema: Secondary | ICD-10-CM | POA: Diagnosis not present

## 2018-05-11 DIAGNOSIS — R609 Edema, unspecified: Secondary | ICD-10-CM

## 2018-05-11 DIAGNOSIS — E1140 Type 2 diabetes mellitus with diabetic neuropathy, unspecified: Secondary | ICD-10-CM | POA: Diagnosis not present

## 2018-05-11 DIAGNOSIS — R69 Illness, unspecified: Secondary | ICD-10-CM | POA: Diagnosis not present

## 2018-05-11 DIAGNOSIS — M810 Age-related osteoporosis without current pathological fracture: Secondary | ICD-10-CM | POA: Diagnosis present

## 2018-05-11 DIAGNOSIS — Z79811 Long term (current) use of aromatase inhibitors: Secondary | ICD-10-CM | POA: Diagnosis not present

## 2018-05-11 DIAGNOSIS — G8929 Other chronic pain: Secondary | ICD-10-CM | POA: Diagnosis not present

## 2018-05-11 DIAGNOSIS — E1149 Type 2 diabetes mellitus with other diabetic neurological complication: Secondary | ICD-10-CM | POA: Diagnosis not present

## 2018-05-11 DIAGNOSIS — M48061 Spinal stenosis, lumbar region without neurogenic claudication: Secondary | ICD-10-CM | POA: Diagnosis not present

## 2018-05-11 DIAGNOSIS — R262 Difficulty in walking, not elsewhere classified: Secondary | ICD-10-CM | POA: Diagnosis not present

## 2018-05-11 LAB — GLUCOSE, CAPILLARY
Glucose-Capillary: 105 mg/dL — ABNORMAL HIGH (ref 70–99)
Glucose-Capillary: 99 mg/dL (ref 70–99)
Glucose-Capillary: 136 mg/dL — ABNORMAL HIGH (ref 70–99)
Glucose-Capillary: 126 mg/dL — ABNORMAL HIGH (ref 70–99)

## 2018-05-11 NOTE — Progress Notes (Signed)
Physical Therapy Treatment Patient Details Name: Amy [name] MRN: [id number] DOB: 07-10-1936 Today's Date: 05/11/2018  RIGHT KNEE ROM:2-90degrees CPM PROM: 0-60 degrees AMBULATION DISTANCE:44feet using RW with Minguard assist  History of Present Illness Amy [name] is a 82 y/o female, s/p Right TKA, 05/05/18 with the diagnosis of Osteoarthritis right knee, history of recent L45, L5-S1 decompressive laminotomies and foraminotomies, L45, L5-S1 PLIF with autograft and T10-S1 PLA w/alografting and fixation 3 months ago    PT Comments    Patient demonstrated good return for completing exercises with verbal cueing and occasional tactile cueing, demonstrates slow labored cadence with fair/good return for right heel to toe stepping, no loss of balance, limited secondary to c/o fatigue and right knee pain, followed with w/c for safety.  Patient tolerated sitting up in chair after therapy.  Patient will benefit from continued physical therapy in hospital and recommended venue below to increase strength, balance, endurance for safe ADLs and gait.   Follow Up Recommendations  SNF     Equipment Recommendations  None recommended by PT    Recommendations for Other Services       Precautions / Restrictions Precautions Precautions: Fall Restrictions Weight Bearing Restrictions: Yes RLE Weight Bearing: Weight bearing as tolerated    Mobility  Bed Mobility Overal bed mobility: Needs Assistance Bed Mobility: Supine to Sit     Supine to sit: Min assist     General bed mobility comments: slow labored movement requiring verbal cueing to use BUE  Transfers Overall transfer level: Needs assistance Equipment used: Rolling walker (2 wheeled) Transfers: Sit to/from [facility] Sit to Stand: Min assist Stand pivot transfers: Min assist       General transfer comment: slow labored movement  Ambulation/Gait Ambulation/Gait assistance: Min guard Gait Distance  (Feet): 30 Feet Assistive device: Rolling walker (2 wheeled) Gait Pattern/deviations: Decreased step length - right;Decreased stance time - right;Decreased stride length Gait velocity: slow   General Gait Details: slow slightly labored cadence with fair/good return for right heel to toe stepping, limited secondary to c/o fatigue/right knee pain, no loss of balance   Stairs             Wheelchair Mobility    Modified Rankin (Stroke Patients Only)       Balance Overall balance assessment: Needs assistance Sitting-balance support: Feet supported;No upper extremity supported Sitting balance-Leahy Scale: Good     Standing balance support: Bilateral upper extremity supported;During functional activity Standing balance-Leahy Scale: Fair Standing balance comment: using RW                            Cognition Arousal/Alertness: Awake/alert Behavior During Therapy: WFL for tasks assessed/performed Overall Cognitive Status: Within Functional Limits for tasks assessed                                        Exercises Total Joint Exercises Ankle Circles/Pumps: AROM;Both;10 reps;Strengthening;Supine Quad Sets: AROM;Strengthening;Supine;Right;10 reps Short Arc Quad: Seated;AAROM;Strengthening;Right;10 reps Heel Slides: AAROM;Strengthening;Right;10 reps;Supine    General Comments        Pertinent Vitals/Pain Pain Assessment: Faces Faces Pain Scale: Hurts little more Pain Location: right knee with movement  Pain Descriptors / Indicators: Aching;Sore;Discomfort Pain Intervention(s): Limited activity within patient's tolerance;Monitored during session    Home Living  Prior Function            PT Goals (current goals can now be found in the care plan section) Acute Rehab PT Goals Patient Stated Goal: return home after rehab PT Goal Formulation: With patient Time For Goal Achievement: 05/19/18 Potential to Achieve  Goals: Good Progress towards PT goals: Progressing toward goals    Frequency           PT Plan Current plan remains appropriate    Co-evaluation              AM-PAC PT "6 Clicks" Daily Activity  Outcome Measure  Difficulty turning over in bed (including adjusting bedclothes, sheets and blankets)?: A Little Difficulty moving from lying on back to sitting on the side of the bed? : A Little Difficulty sitting down on and standing up from a chair with arms (e.g., wheelchair, bedside commode, etc,.)?: A Little Help needed moving to and from a bed to chair (including a wheelchair)?: A Little Help needed walking in hospital room?: A Lot Help needed climbing 3-5 steps with a railing? : A Lot 6 Click Score: 16    End of Session Equipment Utilized During Treatment: Gait belt Activity Tolerance: Patient tolerated treatment well;Patient limited by fatigue;Patient limited by pain Patient left: in chair;with call bell/phone within reach;with chair alarm set Nurse Communication: Mobility status PT Visit Diagnosis: Unsteadiness on feet (R26.81);Other abnormalities of gait and mobility (R26.89);Muscle weakness (generalized) (M62.81)     Time: 1275-1700 PT Time Calculation (min) (ACUTE ONLY): 27 min  Charges:  $Gait Training: 8-22 mins $Therapeutic Exercise: 8-22 mins                     12:34 PM, 05/11/18 Lonell Grandchild, MPT Physical Therapist with [facility] [phone] office [phone] mobile phone

## 2018-05-11 NOTE — Progress Notes (Signed)
[name] discharged [place] per MD order.  Discharge instructions reviewed and discussed with the patient, all questions and concerns answered. Copy of instructions and scripts sent with patient to facility. Report called to [facility], LPN.  Allergies as of 05/11/2018      Reactions   Cymbalta [duloxetine Hcl] Other (See Comments)   Hallucinated and couldn't think; got worse and worse the longer she took this.      Medication List    TAKE these medications   ALPRAZolam 0.5 MG tablet Commonly known as:  XANAX Take 1 tablet (0.5 mg total) by mouth at bedtime.   aspirin 325 MG EC tablet Take 1 tablet (325 mg total) by mouth daily with breakfast.   calcium carbonate 1500 (600 Ca) MG Tabs tablet Commonly known as:  OSCAL Take 600 mg of elemental calcium by mouth daily.   VITAMIN D PO Take 1 capsule by mouth daily.   cholecalciferol 1000 units tablet Commonly known as:  VITAMIN D Take 1 tablet (1,000 Units total) by mouth daily.   docusate sodium 100 MG capsule Commonly known as:  COLACE Take 1 capsule (100 mg total) by mouth 2 (two) times daily.   escitalopram 20 MG tablet Commonly known as:  LEXAPRO Take 1 tablet (20 mg total) by mouth daily.   gabapentin 300 MG capsule Commonly known as:  NEURONTIN Take 300 mg by mouth at bedtime.   ibuprofen 200 MG tablet Commonly known as:  ADVIL,MOTRIN Take 200-400 mg by mouth every 6 (six) hours as needed for mild pain.   letrozole 2.5 MG tablet Commonly known as:  FEMARA Take 1 tablet (2.5 mg total) by mouth daily.   metFORMIN 500 MG tablet Commonly known as:  GLUCOPHAGE Take 500 mg by mouth 2 (two) times daily with a meal.   methocarbamol 500 MG tablet Commonly known as:  ROBAXIN Take 500 mg by mouth 4 (four) times daily as needed for muscle spasms.   oxyCODONE-acetaminophen 7.5-325 MG tablet Commonly known as:  PERCOCET Take 1 tablet by mouth 2 (two) times daily.   risperiDONE 1 MG tablet Commonly known  as:  RISPERDAL Take 1 tablet (1 mg total) by mouth at bedtime.   traZODone 50 MG tablet Commonly known as:  DESYREL Take 1 tablet (50 mg total) by mouth at bedtime.   valACYclovir 500 MG tablet Commonly known as:  VALTREX Take 1,000 mg by mouth See admin instructions. Take 2 tablets (1000 mg) by mouth daily for 3 days as needed for outbreaks.   venlafaxine XR 150 MG 24 hr capsule Commonly known as:  EFFEXOR-XR Take 1 capsule (150 mg total) by mouth 2 (two) times daily.       IV site discontinued and catheter remains intact. Site without signs and symptoms of complications. Dressing and pressure applied.  Patient escorted to [facility] by NT in a wheelchair,  no distress noted upon discharge.  Ralene Muskrat Khayri Kargbo 05/11/2018 1:49 PM

## 2018-05-11 NOTE — Care Management Important Message (Signed)
Important Message  Patient Details  Name: Amy [name] MRN: [id number] Date of Birth: August 09, 1935   Medicare Important Message Given:  Yes    [name] 05/11/2018, 10:26 AM

## 2018-05-11 NOTE — Progress Notes (Signed)
Patient had a knee replacement on the right she still in the hospital I guess insurance did not give authorization for rehab  She looks good today I helped her with some of her therapy I watched her get up to a walker and take a few steps over to the bed where she got in bed and was recumbent secondary to some nausea  No evidence of DVT  Okay to go to rehab  Patient ID: Amy [name], female   DOB: 1936/05/13, 82 y.o.   MRN: [id number]

## 2018-05-11 NOTE — Clinical Social Work Placement (Signed)
   CLINICAL SOCIAL WORK PLACEMENT  NOTE  Date:  05/11/2018  Patient Details  Name: Amy [name] MRN: [id number] Date of Birth: 04-Jul-1936  Clinical Social Work is seeking post-discharge placement for this patient at the [place] level of care (*CSW will initial, date and re-position this form in  chart as items are completed):  Yes   Patient/family provided with [facility] Work Department's list of facilities offering this level of care within the geographic area requested by the patient (or if unable, by the patient's family).  Yes   Patient/family informed of their freedom to choose among providers that offer the needed level of care, that participate in Medicare, Medicaid or managed care program needed by the patient, have an available bed and are willing to accept the patient.  Yes   Patient/family informed of [facility]'s ownership interest in [facility] and [facility], as well as of the fact that they are under no obligation to receive care at these facilities.  PASRR submitted to EDS on 05/06/18     PASRR number received on 05/08/18     Existing PASRR number confirmed on       FL2 transmitted to all facilities in geographic area requested by pt/family on 05/06/18     FL2 transmitted to all facilities within larger geographic area on       Patient informed that his/her managed care company has contracts with or will negotiate with certain facilities, including the following:        Yes   Patient/family informed of bed offers received.  Patient chooses bed at [facility]     Physician recommends and patient chooses bed at      Patient to be transferred to [facility] on 05/11/18.  Patient to be transferred to facility by [facility] Staff     Patient family notified on 05/11/18 of transfer.  Name of family member notified:  daughter, Mindy     PHYSICIAN       Additional Comment:  Facility received  authorization. Discharge clinicals sent to the facility. LCSW signing off.  _______________________________________________ Ambrose Pancoast D, LCSW 05/11/2018, 11:05 AM

## 2018-05-12 ENCOUNTER — Non-Acute Institutional Stay (SKILLED_NURSING_FACILITY): Payer: Medicare HMO | Admitting: Internal Medicine

## 2018-05-12 ENCOUNTER — Encounter: Payer: Self-pay | Admitting: Internal Medicine

## 2018-05-12 ENCOUNTER — Other Ambulatory Visit: Payer: Self-pay

## 2018-05-12 DIAGNOSIS — E1149 Type 2 diabetes mellitus with other diabetic neurological complication: Secondary | ICD-10-CM | POA: Insufficient documentation

## 2018-05-12 DIAGNOSIS — D649 Anemia, unspecified: Secondary | ICD-10-CM | POA: Diagnosis not present

## 2018-05-12 DIAGNOSIS — R69 Illness, unspecified: Secondary | ICD-10-CM | POA: Diagnosis not present

## 2018-05-12 DIAGNOSIS — Z96651 Presence of right artificial knee joint: Secondary | ICD-10-CM | POA: Insufficient documentation

## 2018-05-12 DIAGNOSIS — C50911 Malignant neoplasm of unspecified site of right female breast: Secondary | ICD-10-CM | POA: Diagnosis not present

## 2018-05-12 DIAGNOSIS — F418 Other specified anxiety disorders: Secondary | ICD-10-CM | POA: Diagnosis not present

## 2018-05-12 MED ORDER — ALPRAZOLAM 0.5 MG PO TABS: 0.5000 mg | ORAL_TABLET | Freq: Every day | ORAL | 0 refills | Status: DC

## 2018-05-12 NOTE — Progress Notes (Signed)
Provider:  Veleta Miners MD Location:    [facility] Room Number: 159/P Place of Service:  SNF (31)  PCP: Sinda Du, MD Patient Care Team: Sinda Du, MD as PCP - General (Internal Medicine)  Extended Emergency Contact Information Primary Emergency Contact: Jones,Jonathan Address: [address]          [place], [place] [address] [place] Litter of [place] Phone: [phone] Mobile Phone: [phone] Relation: Son Secondary Emergency Contact: Holt,Carlene Address: [place], [place] [address] [place] Litter of [place] Phone: [phone] Mobile Phone: [phone] Relation: Sister  Code Status: Full Code Goals of Care: Advanced Directive information Advanced Directives 05/12/2018  Does Patient Have a Medical Advance Directive? Yes  Type of Advance Directive (No Data)  Does patient want to make changes to medical advance directive? No - Patient declined  Copy of [place] in Chart? No - copy requested  Would patient like information on creating a medical advance directive? No - Patient declined      Chief Complaint  Patient presents with  . New Admit To SNF    New Admission Visit    HPI: Patient is a 82 y.o. female seen today for admission to SNF for therapy after staying in the hospital from 10/08-10/14 For Right Total Knee arthroplasty. Patient has h/o diabetes mellitus, depression with anxiety and breast cancer  She was admitted electively in the hospital for TKA right knee.  Patient has a history of arthritis and was having issues with pain and mobility. Her postop course was complicated with anemia needing 2 units of transfusion and confusion. Patient doing well in the facility.  She was asking if she can have oxycodone more frequently for her pain control.  She denies any fever or chills. Patient lives by herself but has a supportive son.  She was driving before the surgery.  She is  planning to go home.  She has a caregiver who helps her.  Past Medical History:  Diagnosis Date  . Anxiety   . Arthritis   . Back pain   . Colon polyps   . Depression   . Diabetes mellitus, type II ([place])    diet controlled  . Hypercholesterolemia   . Invasive ductal carcinoma of breast, female, right ([place])   . Osteopenia 11/26/2016  . Skin-picking disorder    has 3 open wounds on right wrist that are being treated. About a quarter in size.   Past Surgical History:  Procedure Laterality Date  . APPENDECTOMY    . BREAST BIOPSY Right 10/09/2016   Procedure: BREAST BIOPSY WITH NEEDLE LOCALIZATION;  Surgeon: [name], MD;  Location: [facility];  Service: General;  Laterality: Right;  . BREAST SURGERY Right   . BUNIONECTOMY Bilateral   . FACIAL COSMETIC SURGERY     drooping eyelid and brow  . HERNIA REPAIR Right    Inguinal x2  . KNEE ARTHROSCOPY Right   . SHOULDER SURGERY Right    rotator cuff repair and removal of bone spur  . TEMPOROMANDIBULAR JOINT SURGERY Right   . TOTAL KNEE ARTHROPLASTY Right 05/05/2018   Procedure: TOTAL KNEE ARTHROPLASTY;  Surgeon: [name], MD;  Location: [facility];  Service: Orthopedics;  Laterality: Right;    reports that she quit smoking about 22 years ago. Her smoking use included cigarettes. She has a 20.00 pack-year smoking history. She has never used smokeless tobacco. She reports that she does  not drink alcohol or use drugs. Social History   Socioeconomic History  . Marital status: Divorced    Spouse name: Not on file  . Number of children: Not on file  . Years of education: Not on file  . Highest education level: Not on file  Occupational History  . 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 Financial resource strain: Not hard at all  . Food insecurity:    Worry: Never true    Inability: Never true  . Transportation needs:    Medical: No    Non-medical: No  Tobacco Use  . Smoking status: Former Smoker    Packs/day:  0.50    Years: 40.00    Pack years: 20.00    Types: Cigarettes    Last attempt to quit: 09/04/1995    Years since quitting: 22.7  . Smokeless tobacco: Never Used  Substance and Sexual Activity  . Alcohol use: No  . Drug use: No    Types: Benzodiazepines, Hydrocodone  . Sexual activity: Never  Lifestyle  . Physical activity:    Days per week: 0 days    Minutes per session: 0 min  . Stress: Only a little  Relationships  . Social connections:    Talks on phone: Once a week    Gets together: Never    Attends religious service: More than 4 times per year    Active member of club or organization: No    Attends meetings of clubs or organizations: Never    Relationship status: Divorced  . Intimate partner violence:    Fear of current or ex partner: No    Emotionally abused: No    Physically abused: No    Forced sexual activity: No  Other Topics Concern  . Not on file  Social History Narrative  . Not on file    Functional Status Survey:    Family History  Problem Relation Age of Onset  . Anxiety disorder Mother   . Depression Mother   . Depression Maternal Grandmother   . Alcohol abuse Brother   . Cancer - Other Sister   . ADD / ADHD Neg Hx   . Bipolar disorder Neg Hx   . Dementia Neg Hx   . Drug abuse Neg Hx   . OCD Neg Hx   . Paranoid behavior Neg Hx   . Schizophrenia Neg Hx   . Seizures Neg Hx   . Sexual abuse Neg Hx   . Physical abuse Neg Hx     Health Maintenance  Topic Date Due  . INFLUENZA VACCINE  06/12/2018 (Originally 02/26/2018)  . URINE MICROALBUMIN  06/12/2018 (Originally 12/30/1947)  . TETANUS/TDAP  06/12/2018 (Originally 12/29/1956)  . PNA vac Low Risk Adult (1 of 2 - PCV13) 06/12/2018 (Originally 12/30/2002)  . DEXA SCAN  Completed    Allergies  Allergen Reactions  . Cymbalta [Duloxetine Hcl] Other (See Comments)    Hallucinated and couldn't think; got worse and worse the longer she took this.    Outpatient Encounter Medications as of 05/12/2018    Medication Sig  . ALPRAZolam (XANAX) 0.5 MG tablet Take 1 tablet (0.5 mg total) by mouth at bedtime.  Marland Kitchen aspirin EC 325 MG EC tablet Take 1 tablet (325 mg total) by mouth daily with breakfast.  . calcium carbonate (OSCAL) 1500 (600 Ca) MG TABS tablet Take 600 mg of elemental calcium by mouth daily.  . cholecalciferol (VITAMIN D) 1000 units tablet Take 1 tablet (1,000  Units total) by mouth daily.  Marland Kitchen docusate sodium (COLACE) 100 MG capsule Take 1 capsule (100 mg total) by mouth 2 (two) times daily.  Marland Kitchen escitalopram (LEXAPRO) 20 MG tablet Take 1 tablet (20 mg total) by mouth daily.  Marland Kitchen gabapentin (NEURONTIN) 300 MG capsule Take 300 mg by mouth at bedtime.   Marland Kitchen ibuprofen (ADVIL,MOTRIN) 200 MG tablet Take 200-400 mg by mouth every 6 (six) hours as needed for mild pain.  Marland Kitchen letrozole (FEMARA) 2.5 MG tablet Take 1 tablet (2.5 mg total) by mouth daily.  . metFORMIN (GLUCOPHAGE) 500 MG tablet Take 500 mg by mouth 2 (two) times daily with a meal.   . methocarbamol (ROBAXIN) 500 MG tablet Take 500 mg by mouth 4 (four) times daily as needed for muscle spasms.   Marland Kitchen oxyCODONE-acetaminophen (PERCOCET) 7.5-325 MG tablet Take 1 tablet by mouth 2 (two) times daily.  . risperiDONE (RISPERDAL) 1 MG tablet Take 1 tablet (1 mg total) by mouth at bedtime.  . traZODone (DESYREL) 50 MG tablet Take 1 tablet (50 mg total) by mouth at bedtime.  Marland Kitchen venlafaxine XR (EFFEXOR XR) 150 MG 24 hr capsule Take 1 capsule (150 mg total) by mouth 2 (two) times daily.  . [DISCONTINUED] Cholecalciferol (VITAMIN D PO) Take 1 capsule by mouth daily.  . [DISCONTINUED] valACYclovir (VALTREX) 500 MG tablet Take 1,000 mg by mouth See admin instructions. Take 2 tablets (1000 mg) by mouth daily for 3 days as needed for outbreaks.   No facility-administered encounter medications on file as of 05/12/2018.      Review of Systems  Review of Systems  Constitutional: Negative for activity change, appetite change, chills, diaphoresis, fatigue and fever.   HENT: Negative for mouth sores, postnasal drip, rhinorrhea, sinus pain and sore throat.   Respiratory: Negative for apnea, cough, chest tightness, shortness of breath and wheezing.   Cardiovascular: Negative for chest pain, palpitations and leg swelling.  Gastrointestinal: Negative for abdominal distention, abdominal pain, diarrhea, nausea and vomiting. Positive for Constipation Genitourinary: Negative for dysuria and frequency.  Musculoskeletal: Negative for arthralgias, joint swelling and myalgias.  Skin: Negative for rash.  Neurological: Negative for dizziness, syncope, weakness, light-headedness and numbness.  Psychiatric/Behavioral: Negative for behavioral problems, confusion and sleep disturbance.     Vitals:   05/12/18 1058  BP: 122/68  Pulse: 90  Resp: 18  Temp: 97.9 F (36.6 C)  TempSrc: Oral   There is no height or weight on file to calculate BMI. Physical Exam  Constitutional: She is oriented to person, place, and time. She appears well-developed and well-nourished.  HENT:  Head: Normocephalic.  Mouth/Throat: Oropharynx is clear and moist.  Eyes: Pupils are equal, round, and reactive to light.  Neck: Neck supple.  Cardiovascular: Normal rate and regular rhythm.  Pulmonary/Chest: Effort normal and breath sounds normal. No stridor. No respiratory distress.  Abdominal: Soft. Bowel sounds are normal. She exhibits no distension. There is no tenderness. There is no guarding.  Musculoskeletal:  Mode Edema in Right LE. With Pain  Neurological: She is alert and oriented to person, place, and time.  No Focal deficits  Skin: Skin is warm and dry.  Psychiatric: She has a normal mood and affect. Her behavior is normal. Thought content normal.  Nursing note and vitals reviewed.   Labs reviewed: Basic Metabolic Panel: Recent Labs    02/01/18 1011 05/01/18 1152 05/06/18 0538  NA 134* 140 137  K 3.6 4.2 4.4  CL 97* 103 101  CO2 27 28 27   GLUCOSE  123* 137* 182*  BUN 13  20 16   CREATININE 0.77 0.71 0.59  CALCIUM 8.9 9.0 7.7*  MG 1.6*  --   --    Liver Function Tests: Recent Labs    06/05/17 0928 12/04/17 1218 02/01/18 1011  AST 20 19 20   ALT 13* 13* 18  ALKPHOS 59 55 48  BILITOT 0.5 0.6 0.6  PROT 6.9 7.0 5.3*  ALBUMIN 3.9 4.2 2.4*   No results for input(s): LIPASE, AMYLASE in the last 8760 hours. No results for input(s): AMMONIA in the last 8760 hours. CBC: Recent Labs    01/16/18 1322  02/02/18 0409 05/01/18 1152 05/06/18 0538 05/07/18 0544 05/08/18 0817  WBC 8.5   < > 7.8 7.9 8.8 6.9 6.4  NEUTROABS 5.5  --  5.6 5.8  --   --   --   HGB 14.5   < > 10.6* 13.7 9.7* 7.8* 10.5*  HCT 43.6   < > 32.1* 42.0 31.8* 25.0* 32.1*  MCV 91.6   < > 92.0 91.3 91.1 89.9 88.4  PLT 271   < > 233 258 223 166 163   < > = values in this interval not displayed.   Cardiac Enzymes: No results for input(s): CKTOTAL, CKMB, CKMBINDEX, TROPONINI in the last 8760 hours. BNP: Invalid input(s): POCBNP Lab Results  Component Value Date   HGBA1C 5.9 (H) 05/01/2018   No results found for: TSH No results found for: VITAMINB12 No results found for: FOLATE No results found for: IRON, TIBC, FERRITIN  Imaging and Procedures obtained prior to SNF admission: No results found.  Assessment/Plan  Depression with anxiety On Number of Meds Follows with Psych Symptoms controlled in Facility Status post total right knee replacement Will Increase her Oxycodone Q6 hours PRN for better Control of pain Continue Robaxin Disnontinue Ibuprofen after 1 week. WBAT On Aspirin Only Follow with Ortho Diabetes Mellitus On Metformin A1C was 5.8 Anemia S/P PRBC transfusion Repeat CBC   H/O Breast Cancer On Letrozole       Family/ staff Communication:   Labs/tests ordered:

## 2018-05-12 NOTE — Telephone Encounter (Signed)
RX Fax for [facility]@ [phone]  

## 2018-05-14 ENCOUNTER — Other Ambulatory Visit: Payer: Self-pay

## 2018-05-14 MED ORDER — OXYCODONE-ACETAMINOPHEN 7.5-325 MG PO TABS: 1.0000 | ORAL_TABLET | Freq: Two times a day (BID) | ORAL | 0 refills | Status: DC

## 2018-05-14 NOTE — Telephone Encounter (Signed)
RX Fax for [facility]@ [phone]  

## 2018-05-19 ENCOUNTER — Encounter: Payer: Self-pay | Admitting: Internal Medicine

## 2018-05-19 ENCOUNTER — Non-Acute Institutional Stay (SKILLED_NURSING_FACILITY): Payer: Medicare HMO | Admitting: Internal Medicine

## 2018-05-19 ENCOUNTER — Other Ambulatory Visit (HOSPITAL_COMMUNITY)
Admission: RE | Admit: 2018-05-19 | Discharge: 2018-05-19 | Disposition: A | Discharge status: Home / Self Care | Payer: Medicare HMO | Source: Skilled Nursing Facility | Attending: *Deleted | Admitting: *Deleted

## 2018-05-19 ENCOUNTER — Ambulatory Visit (HOSPITAL_COMMUNITY)
Admission: RE | Admit: 2018-05-19 | Discharge: 2018-05-19 | Disposition: A | Discharge status: Home / Self Care | Payer: Medicare HMO | Source: Skilled Nursing Facility | Attending: Internal Medicine | Admitting: Internal Medicine

## 2018-05-19 DIAGNOSIS — R600 Localized edema: Secondary | ICD-10-CM

## 2018-05-19 DIAGNOSIS — Z96651 Presence of right artificial knee joint: Secondary | ICD-10-CM | POA: Diagnosis not present

## 2018-05-19 DIAGNOSIS — M7989 Other specified soft tissue disorders: Secondary | ICD-10-CM | POA: Insufficient documentation

## 2018-05-19 DIAGNOSIS — E1149 Type 2 diabetes mellitus with other diabetic neurological complication: Secondary | ICD-10-CM | POA: Diagnosis not present

## 2018-05-19 DIAGNOSIS — Z471 Aftercare following joint replacement surgery: Secondary | ICD-10-CM | POA: Insufficient documentation

## 2018-05-19 DIAGNOSIS — D649 Anemia, unspecified: Secondary | ICD-10-CM

## 2018-05-19 LAB — COMPREHENSIVE METABOLIC PANEL
ALT: 11 U/L (ref 0–44)
AST: 13 U/L — ABNORMAL LOW (ref 15–41)
Albumin: 3.2 g/dL — ABNORMAL LOW (ref 3.5–5.0)
Alkaline Phosphatase: 85 U/L (ref 38–126)
Anion gap: 6 (ref 5–15)
BUN: 20 mg/dL (ref 8–23)
CO2: 30 mmol/L (ref 22–32)
Calcium: 9.2 mg/dL (ref 8.9–10.3)
Chloride: 102 mmol/L (ref 98–111)
Creatinine, Ser: 0.8 mg/dL (ref 0.44–1.00)
GFR calc Af Amer: >60 mL/min (ref >60)
GFR calc non Af Amer: >60 mL/min (ref >60)
Glucose, Bld: 117 mg/dL — ABNORMAL HIGH (ref 70–99)
Potassium: 4.5 mmol/L (ref 3.5–5.1)
Sodium: 138 mmol/L (ref 135–145)
Total Bilirubin: 0.7 mg/dL (ref 0.3–1.2)
Total Protein: 5.7 g/dL — ABNORMAL LOW (ref 6.5–8.1)

## 2018-05-19 NOTE — Progress Notes (Signed)
Location:    [facility] Room Number: 159/P Place of Service:  SNF [phone]) Provider:  Terisa Starr, MD  Patient Care Team: Sinda Du, MD as PCP - General (Internal Medicine)  Extended Emergency Contact Information Primary Emergency Contact: Jones,Jonathan Address: [address]          [place], [place] [address] Johnnette Litter of [place] Phone: [phone] Mobile Phone: [phone] Relation: Son Secondary Emergency Contact: Holt,Carlene Address: [place], [place] [address] Johnnette Litter of [place] Phone: [phone] Mobile Phone: [phone] Relation: Sister  Code Status:  Full Code Goals of care: Advanced Directive information Advanced Directives 05/19/2018  Does Patient Have a Medical Advance Directive? Yes  Type of Advance Directive (No Data)  Does patient want to make changes to medical advance directive? No - Patient declined  Copy of [place] in Chart? No - copy requested  Would patient like information on creating a medical advance directive? No - Patient declined     Chief Complaint  Patient presents with  . Acute Visit    Right lower leg edema    HPI:  Pt is a 82 y.o. female seen today for an acute visit for increased edema of the right lower leg.  She was recently admitted here for therapy after hospitalization for right total knee arthroplasty she also has a history of diabetes depression anxiety and breast cancer.  Postop course after the knee arthroplasty was complicated with anemia requiring a transfusion and she had confusion- confusion resolved her hemoglobin did rise to 10.5.  Her pain is currently being managed with Percocet 2 times a day routinely and PRN every 6 hours as well as Robaxin as needed she also has diabetic neuropathy treated with Neurontin at night.  Apparently after therapy she complained of some increased pain and noted to have some increased  edema apparently recently as well of her right calf.  She is on aspirin 325 mg a day for DVT prophylaxis.  Vital signs appear to be stable her main complaint of some right calf discomfort--she does not complain of any chest pain or shortness of breath      Past Medical History:  Diagnosis Date  . Anxiety   . Arthritis   . Back pain   . Colon polyps   . Depression   . Diabetes mellitus, type II ([place])    diet controlled  . Hypercholesterolemia   . Invasive ductal carcinoma of breast, female, right ([place])   . Osteopenia 11/26/2016  . Skin-picking disorder    has 3 open wounds on right wrist that are being treated. About a quarter in size.   Past Surgical History:  Procedure Laterality Date  . APPENDECTOMY    . BREAST BIOPSY Right 10/09/2016   Procedure: BREAST BIOPSY WITH NEEDLE LOCALIZATION;  Surgeon: [name], MD;  Location: [facility];  Service: General;  Laterality: Right;  . BREAST SURGERY Right   . BUNIONECTOMY Bilateral   . FACIAL COSMETIC SURGERY     drooping eyelid and brow  . HERNIA REPAIR Right    Inguinal x2  . KNEE ARTHROSCOPY Right   . SHOULDER SURGERY Right    rotator cuff repair and removal of bone spur  . TEMPOROMANDIBULAR JOINT SURGERY Right   . TOTAL KNEE ARTHROPLASTY Right 05/05/2018   Procedure: TOTAL KNEE ARTHROPLASTY;  Surgeon: [name], MD;  Location: [facility];  Service: Orthopedics;  Laterality: Right;    Allergies  Allergen Reactions  . Cymbalta [Duloxetine Hcl] Other (See Comments)    Hallucinated and couldn't think; got worse and worse the longer she took this.    Outpatient Encounter Medications as of 05/19/2018  Medication Sig  . ALPRAZolam (XANAX) 0.5 MG tablet Take 1 tablet (0.5 mg total) by mouth at bedtime.  Marland Kitchen aspirin EC 325 MG EC tablet Take 1 tablet (325 mg total) by mouth daily with breakfast.  . calcium carbonate (OSCAL) 1500 (600 Ca) MG TABS tablet Take 600 mg of elemental calcium by mouth daily.  . cholecalciferol  (VITAMIN D) 1000 units tablet Take 1 tablet (1,000 Units total) by mouth daily.  Marland Kitchen docusate sodium (COLACE) 100 MG capsule Take 1 capsule (100 mg total) by mouth 2 (two) times daily.  Marland Kitchen escitalopram (LEXAPRO) 20 MG tablet Take 1 tablet (20 mg total) by mouth daily.  Marland Kitchen gabapentin (NEURONTIN) 300 MG capsule Take 300 mg by mouth at bedtime.   Marland Kitchen letrozole (FEMARA) 2.5 MG tablet Take 1 tablet (2.5 mg total) by mouth daily.  . metFORMIN (GLUCOPHAGE) 500 MG tablet Take 500 mg by mouth 2 (two) times daily with a meal.   . methocarbamol (ROBAXIN) 500 MG tablet Take 500 mg by mouth 4 (four) times daily as needed for muscle spasms.   Marland Kitchen oxyCODONE-acetaminophen (PERCOCET) 7.5-325 MG tablet Take 1 tablet by mouth 2 (two) times daily.  Marland Kitchen oxyCODONE-acetaminophen (PERCOCET/ROXICET) 5-325 MG tablet Take 1 tablet by mouth every 6 (six) hours as needed for severe pain.  Marland Kitchen risperiDONE (RISPERDAL) 1 MG tablet Take 1 tablet (1 mg total) by mouth at bedtime.  . senna (SENOKOT) 8.6 MG tablet Take 2 tablets by mouth at bedtime.  . traZODone (DESYREL) 50 MG tablet Take 1 tablet (50 mg total) by mouth at bedtime.  Marland Kitchen venlafaxine XR (EFFEXOR XR) 150 MG 24 hr capsule Take 1 capsule (150 mg total) by mouth 2 (two) times daily.  . [DISCONTINUED] ibuprofen (ADVIL,MOTRIN) 200 MG tablet Take 200-400 mg by mouth every 6 (six) hours as needed for mild pain.   No facility-administered encounter medications on file as of 05/19/2018.     Review of Systems   In general she is not complaining any fever or chills.  Skin is not complain of rashes or itching surgical site right knee is currently covered per nursing and patient this appears fairly benign staples in place- she does have some violaceous bruising back of her right ankle and right calf.  Eyes ears nose mouth and throat does not complain of visual changes or sore throat or difficulty swallowing.  Respiratory denies shortness of breath or cough.  Cardiac does not complain  of chest pain has some increased edema on the right lower extremity.  GI is not complaining of nausea vomiting diarrhea constipation.  Musculoskeletal does complain of some pain of the right lower extremity more so the lower leg.  Neurologic does not complain of dizziness headache or numbness at this point does have a history of peripheral neuropathy on Neurontin at night.  Psych has a diagnosis of anxiety -- depression but does not appear overtly anxious or depressed this afternoon does not complain of this   There is no immunization history on file for this patient. Pertinent  Health Maintenance Due  Topic Date Due  . INFLUENZA VACCINE  06/12/2018 (Originally 02/26/2018)  . URINE MICROALBUMIN  06/12/2018 (Originally 12/30/1945)  . PNA vac Low Risk Adult (1 of 2 - PCV13) 06/12/2018 (Originally  12/30/2002)  . FOOT EXAM  06/19/2018 (Originally 12/30/1945)  . OPHTHALMOLOGY EXAM  06/19/2018 (Originally 12/30/1945)  . HEMOGLOBIN A1C  10/31/2018  . DEXA SCAN  Completed   No flowsheet data found. Functional Status Survey:    Vitals:   05/19/18 1555  BP: 128/62  Pulse: 84  Respirations are 16--temp is 97.6  Physical Exam  In general this is a pleasant elderly female in no acute distress lying in bed-.  Her skin is warm and dry she does have small areas of violaceous bruising back of right calf and her right ankle.  Surgical site is currently covered right knee- what I was able to assess staples were in place I do not see concerning drainage bleeding or erythema.  Eyes visual acuity appears to be intact sclera and conjunctive are clear.  Oropharynx is clear mucous membranes moist.  Chest is clear to auscultation there is no labored breathing.  Heart is regular rate and rhythm without murmur gallop or rub she has some moderate edema on the right pedal pulse is intact.  Abdomen is soft nontender with positive bowel sounds.  Musculoskeletal she is able to move her upper extremities with  baseline strength- as well as her left lower extremity-limited strength and mobility of her right lower extremity status post surgery.  Neurologic is grossly intact her speech is clear no lateralizing findings.  Psych she is alert and oriented pleasant and appropriate   Labs reviewed: Recent Labs    02/01/18 1011 05/01/18 1152 05/06/18 0538 05/19/18 0405  NA 134* 140 137 138  K 3.6 4.2 4.4 4.5  CL 97* 103 101 102  CO2 27 28 27 30   GLUCOSE 123* 137* 182* 117*  BUN 13 20 16 20   CREATININE 0.77 0.71 0.59 0.80  CALCIUM 8.9 9.0 7.7* 9.2  MG 1.6*  --   --   --    Recent Labs    12/04/17 1218 02/01/18 1011 05/19/18 0405  AST 19 20 13*  ALT 13* 18 11  ALKPHOS 55 48 85  BILITOT 0.6 0.6 0.7  PROT 7.0 5.3* 5.7*  ALBUMIN 4.2 2.4* 3.2*   Recent Labs    01/16/18 1322  02/02/18 0409 05/01/18 1152 05/06/18 0538 05/07/18 0544 05/08/18 0817  WBC 8.5   < > 7.8 7.9 8.8 6.9 6.4  NEUTROABS 5.5  --  5.6 5.8  --   --   --   HGB 14.5   < > 10.6* 13.7 9.7* 7.8* 10.5*  HCT 43.6   < > 32.1* 42.0 31.8* 25.0* 32.1*  MCV 91.6   < > 92.0 91.3 91.1 89.9 88.4  PLT 271   < > 233 258 223 166 163   < > = values in this interval not displayed.   No results found for: TSH Lab Results  Component Value Date   HGBA1C 5.9 (H) 05/01/2018   No results found for: CHOL, HDL, LDLCALC, LDLDIRECT, TRIG, CHOLHDL  Significant Diagnostic Results in last 30 days:  Dg Tibia/fibula Right  Result Date: 05/19/2018 CLINICAL DATA:  Right lower extremity pain and edema. Recent total knee replacement EXAM: RIGHT TIBIA AND FIBULA - 2 VIEW COMPARISON:  05/05/2018 FINDINGS: Nonspecific soft tissue swelling. There is a total knee arthroplasty that is well seated. No periprosthetic fracture. Osteopenia. IMPRESSION: Soft tissue swelling without acute osseous finding. Electronically Signed   By: Monte Fantasia M.D.   On: 05/19/2018 15:59   Dg Knee Right Port  Result Date: 05/05/2018 CLINICAL DATA:  Status post  right  knee replacement EXAM: PORTABLE RIGHT KNEE - 1-2 VIEW COMPARISON:  04/22/2018 FINDINGS: Right knee replacement is now seen in satisfactory position. No acute bony or soft tissue abnormality is noted IMPRESSION: Status post right knee replacement. Electronically Signed   By: Inez Catalina M.D.   On: 05/05/2018 10:22   Dg Knee Ap/lat W/sunrise Right  Result Date: 04/22/2018 [facility] report Dictated by Dr. Aline Brochure Chief complaint  Pain right knee Alignment valgus 13 degrees Compared to the previous film about a year ago the lateral compartment is now completely obliterated with bone-on-bone contact and subchondral sclerosis no osteophytes are seen.  Patella is centered without subluxation dislocation or tilt Lateral x-ray does not show any posterior osteophytes. Impression severe arthritis valgus of 13 degrees.    Assessment/Plan  -----#1-history of right calf edema- will order a venous Doppler also will get x-rays of the area in the ankle- she is on aspirin 325 mg a day for DVT prophylaxis and actually will have orthopedic follow-up tomorrow but would like to get a Doppler as expediently as possible to rule out any acute process with a clot- as well as rule out any ankle fracture.  She does continue on Percocet twice daily for pain and every 6 hours as well as Robaxin as needed.  2.  History of postop anemia this rose to 10.5 status post transfusion appears had been as low as 7.8- will update a CBC tomorrow to keep an eye on this.  3.  Diabetes type 2 she continues on Glucophage blood sugars appear to be stable largely in the lower 100s in the a.m.-.  4.  Anxiety this appears stable at this point she does have Xanax at night.  Addendum.  We have received the results of the x-ray and the Doppler-Doppler is negative for DVT in visualized veins- it did show edema- x-ray did not show any significant fracture it did show subcutaneous edema as well.  I did reassess patient she  appeared to be comfortable she was wondering if she can start her CPM machine back-and this was held pending results of the Doppler and x-ray which again were negative.  Again she will have orthopedic follow-up tomorrow.  [id number] --

## 2018-05-20 ENCOUNTER — Encounter: Payer: Self-pay | Admitting: Orthopedic Surgery

## 2018-05-20 ENCOUNTER — Ambulatory Visit (INDEPENDENT_AMBULATORY_CARE_PROVIDER_SITE_OTHER): Payer: Medicare HMO | Admitting: Orthopedic Surgery

## 2018-05-20 ENCOUNTER — Other Ambulatory Visit (HOSPITAL_COMMUNITY)
Admission: RE | Admit: 2018-05-20 | Discharge: 2018-05-20 | Disposition: A | Discharge status: Home / Self Care | Payer: Medicare HMO | Source: Skilled Nursing Facility | Attending: Internal Medicine | Admitting: Internal Medicine

## 2018-05-20 VITALS — BP 114/71 | HR 92 | Ht 64.0 in | Wt 162.0 lb

## 2018-05-20 DIAGNOSIS — Z96651 Presence of right artificial knee joint: Secondary | ICD-10-CM

## 2018-05-20 DIAGNOSIS — D62 Acute posthemorrhagic anemia: Secondary | ICD-10-CM | POA: Insufficient documentation

## 2018-05-20 LAB — CBC WITH DIFFERENTIAL/PLATELET
Abs Immature Granulocytes: 0.03 10*3/uL (ref 0.00–0.07)
Basophils Absolute: 0 10*3/uL (ref 0.0–0.1)
Basophils Relative: 0 %
Eosinophils Absolute: 0.3 10*3/uL (ref 0.0–0.5)
Eosinophils Relative: 5 %
HCT: 36.8 % (ref 36.0–46.0)
Hemoglobin: 11.3 g/dL — ABNORMAL LOW (ref 12.0–15.0)
Immature Granulocytes: 0 %
Lymphocytes Relative: 19 %
Lymphs Abs: 1.3 10*3/uL (ref 0.7–4.0)
MCH: 29 pg (ref 26.0–34.0)
MCHC: 30.7 g/dL (ref 30.0–36.0)
MCV: 94.6 fL (ref 80.0–100.0)
Monocytes Absolute: 0.5 10*3/uL (ref 0.1–1.0)
Monocytes Relative: 8 %
Neutro Abs: 4.6 10*3/uL (ref 1.7–7.7)
Neutrophils Relative %: 68 %
Platelets: 340 10*3/uL (ref 150–400)
RBC: 3.89 MIL/uL (ref 3.87–5.11)
RDW: 13.9 % (ref 11.5–15.5)
WBC: 6.9 10*3/uL (ref 4.0–10.5)
nRBC: 0 % (ref 0.0–0.2)

## 2018-05-20 NOTE — Progress Notes (Signed)
Chief Complaint  Patient presents with  . Knee Pain    Right knee TKA 05/05/18    Right total knee postop day 15.  The staples were removed at the nursing home her incision looks good.  She says she is not quite independent getting up to go to the bathroom or go to the eating area.  She is had a lumbar fusion about 4 months ago and that seems to be doing well  I will see her in 2 weeks so far so good  Encounter Diagnosis  Name Primary?  . Status post total right knee replacement Yes

## 2018-05-22 ENCOUNTER — Encounter (HOSPITAL_COMMUNITY)
Admission: RE | Admit: 2018-05-22 | Discharge: 2018-05-22 | Disposition: A | Discharge status: Home / Self Care | Payer: Medicare HMO | Source: Skilled Nursing Facility | Attending: Internal Medicine | Admitting: Internal Medicine

## 2018-05-22 DIAGNOSIS — M48061 Spinal stenosis, lumbar region without neurogenic claudication: Secondary | ICD-10-CM | POA: Insufficient documentation

## 2018-05-22 DIAGNOSIS — F424 Excoriation (skin-picking) disorder: Secondary | ICD-10-CM | POA: Insufficient documentation

## 2018-05-22 DIAGNOSIS — D62 Acute posthemorrhagic anemia: Secondary | ICD-10-CM | POA: Insufficient documentation

## 2018-05-22 DIAGNOSIS — Z471 Aftercare following joint replacement surgery: Secondary | ICD-10-CM | POA: Insufficient documentation

## 2018-05-22 DIAGNOSIS — Z96651 Presence of right artificial knee joint: Secondary | ICD-10-CM | POA: Insufficient documentation

## 2018-05-22 LAB — CBC WITH DIFFERENTIAL/PLATELET
Abs Immature Granulocytes: 0.03 10*3/uL (ref 0.00–0.07)
Basophils Absolute: 0 10*3/uL (ref 0.0–0.1)
Basophils Relative: 0 %
Eosinophils Absolute: 0.2 10*3/uL (ref 0.0–0.5)
Eosinophils Relative: 5 %
HCT: 37.9 % (ref 36.0–46.0)
Hemoglobin: 11.5 g/dL — ABNORMAL LOW (ref 12.0–15.0)
Immature Granulocytes: 1 %
Lymphocytes Relative: 18 %
Lymphs Abs: 0.9 10*3/uL (ref 0.7–4.0)
MCH: 28.5 pg (ref 26.0–34.0)
MCHC: 30.3 g/dL (ref 30.0–36.0)
MCV: 94 fL (ref 80.0–100.0)
Monocytes Absolute: 0.4 10*3/uL (ref 0.1–1.0)
Monocytes Relative: 8 %
Neutro Abs: 3.4 10*3/uL (ref 1.7–7.7)
Neutrophils Relative %: 68 %
Platelets: 342 10*3/uL (ref 150–400)
RBC: 4.03 MIL/uL (ref 3.87–5.11)
RDW: 14.1 % (ref 11.5–15.5)
WBC: 4.9 10*3/uL (ref 4.0–10.5)
nRBC: 0 % (ref 0.0–0.2)

## 2018-05-28 ENCOUNTER — Encounter: Payer: Self-pay | Admitting: Internal Medicine

## 2018-05-28 ENCOUNTER — Non-Acute Institutional Stay (SKILLED_NURSING_FACILITY): Payer: Medicare HMO | Admitting: Internal Medicine

## 2018-05-28 DIAGNOSIS — F418 Other specified anxiety disorders: Secondary | ICD-10-CM | POA: Diagnosis not present

## 2018-05-28 DIAGNOSIS — Z96651 Presence of right artificial knee joint: Secondary | ICD-10-CM

## 2018-05-28 DIAGNOSIS — D649 Anemia, unspecified: Secondary | ICD-10-CM | POA: Diagnosis not present

## 2018-05-28 DIAGNOSIS — E1149 Type 2 diabetes mellitus with other diabetic neurological complication: Secondary | ICD-10-CM

## 2018-05-28 DIAGNOSIS — R69 Illness, unspecified: Secondary | ICD-10-CM | POA: Diagnosis not present

## 2018-05-28 NOTE — Progress Notes (Signed)
Location:    [facility] Room Number: 159/P Place of Service:  SNF [phone])  Provider: Granville Lewis PA-C  PCP: Sinda Du, MD Patient Care Team: Sinda Du, MD as PCP - General (Internal Medicine)  Extended Emergency Contact Information Primary Emergency Contact: Jones,Jonathan Address: [address]          [place], [place] [address] Johnnette Litter of [place] Phone: [phone] Mobile Phone: [phone] Relation: Son Secondary Emergency Contact: Holt,Carlene Address: [place], [place] [address] Johnnette Litter of [place] Phone: [phone] Mobile Phone: [phone] Relation: Sister  Code Status: Full Code Goals of care:  Advanced Directive information Advanced Directives 05/28/2018  Does Patient Have a Medical Advance Directive? Yes  Type of Advance Directive (No Data)  Does patient want to make changes to medical advance directive? No - Patient declined  Copy of [place] in Chart? No - copy requested  Would patient like information on creating a medical advance directive? No - Patient declined     Allergies  Allergen Reactions  . Cymbalta [Duloxetine Hcl] Other (See Comments)    Hallucinated and couldn't think; got worse and worse the longer she took this.    Chief Complaint  Patient presents with  . Discharge Note    Discharge Visit    HPI:  82 y.o. female seen for discharge from facility early next week.  She was here for rehab after a right knee replacement secondary to end-stage osteoarthritis- postop course appears to be fairly uncomplicated she did have postop anemia but hemoglobin is rising it was 11.5 on most recent lab on October 25.-Of note she did receive a transfusion in the hospital  We also did do a venous Doppler of her right leg secondary to edema however this did not show anything acute it did show subcutaneous edema- subsequent x-ray also showed similar findings with no  acute changes.  Currently the edema has improved she is on Percocet for pain routinely as well as as needed.  She apparently has tolerated this well.  She is on 325 mg of aspirin for anticoagulation DVT prophylaxis.  Regards to other diagnoses she also has a history of type 2 diabetes on Glucophage as well as depression with anxiety-history of breast cancer as well as diabetic neuropathy and insomnia.  These have been stable during her stay she is on Glucophage with a history of type 2 diabetes hemoglobin A1c was 5.8 CBGs appear to be in the low 100s.  In regards to depression with anxiety this is been stable as well she has been followed by psychiatry in the past she is on numerous medications including Effexor--Lexapro as well as risperidone.  She is on trazodone at night for insomnia.  Currently she is sitting in her wheelchair comfortably she does not have any complaints she is looking forward to going home she does have apparently someone who is with her a significant amount of the time- she will need continued PT and OT as well as home health support for her multiple medical issues    Past Medical History:  Diagnosis Date  . Anxiety   . Arthritis   . Back pain   . Colon polyps   . Depression   . Diabetes mellitus, type II ([place])    diet controlled  . Hypercholesterolemia   . Invasive ductal carcinoma of breast, female, right ([place])   . Osteopenia 11/26/2016  . Skin-picking disorder  has 3 open wounds on right wrist that are being treated. About a quarter in size.    Past Surgical History:  Procedure Laterality Date  . APPENDECTOMY    . BREAST BIOPSY Right 10/09/2016   Procedure: BREAST BIOPSY WITH NEEDLE LOCALIZATION;  Surgeon: [name], MD;  Location: [facility];  Service: General;  Laterality: Right;  . BREAST SURGERY Right   . BUNIONECTOMY Bilateral   . FACIAL COSMETIC SURGERY     drooping eyelid and brow  . HERNIA REPAIR Right    Inguinal x2  . KNEE ARTHROSCOPY  Right   . SHOULDER SURGERY Right    rotator cuff repair and removal of bone spur  . TEMPOROMANDIBULAR JOINT SURGERY Right   . TOTAL KNEE ARTHROPLASTY Right 05/05/2018   Procedure: TOTAL KNEE ARTHROPLASTY;  Surgeon: [name], MD;  Location: [facility];  Service: Orthopedics;  Laterality: Right;      reports that she quit smoking about 22 years ago. Her smoking use included cigarettes. She has a 20.00 pack-year smoking history. She has never used smokeless tobacco. She reports that she does not drink alcohol or use drugs. Social History   Socioeconomic History  . Marital status: Divorced    Spouse name: Not on file  . Number of children: Not on file  . Years of education: Not on file  . Highest education level: Not on file  Occupational History  . 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 Financial resource strain: Not hard at all  . Food insecurity:    Worry: Never true    Inability: Never true  . Transportation needs:    Medical: No    Non-medical: No  Tobacco Use  . Smoking status: Former Smoker    Packs/day: 0.50    Years: 40.00    Pack years: 20.00    Types: Cigarettes    Last attempt to quit: 09/04/1995    Years since quitting: 22.7  . Smokeless tobacco: Never Used  Substance and Sexual Activity  . Alcohol use: No  . Drug use: No    Types: Benzodiazepines, Hydrocodone  . Sexual activity: Never  Lifestyle  . Physical activity:    Days per week: 0 days    Minutes per session: 0 min  . Stress: Only a little  Relationships  . Social connections:    Talks on phone: Once a week    Gets together: Never    Attends religious service: More than 4 times per year    Active member of club or organization: No    Attends meetings of clubs or organizations: Never    Relationship status: Divorced  . Intimate partner violence:    Fear of current or ex partner: No    Emotionally abused: No    Physically abused: No    Forced sexual activity: No  Other  Topics Concern  . Not on file  Social History Narrative  . Not on file   Functional Status Survey:    Allergies  Allergen Reactions  . Cymbalta [Duloxetine Hcl] Other (See Comments)    Hallucinated and couldn't think; got worse and worse the longer she took this.    Pertinent  Health Maintenance Due  Topic Date Due  . INFLUENZA VACCINE  06/12/2018 (Originally 02/26/2018)  . URINE MICROALBUMIN  06/12/2018 (Originally 12/30/1945)  . PNA vac Low Risk Adult (1 of 2 - PCV13) 06/12/2018 (Originally 12/30/2002)  . FOOT EXAM  06/19/2018 (Originally 12/30/1945)  . OPHTHALMOLOGY  EXAM  06/19/2018 (Originally 12/30/1945)  . HEMOGLOBIN A1C  10/31/2018  . DEXA SCAN  Completed    Medications: Outpatient Encounter Medications as of 05/28/2018  Medication Sig  . aspirin EC 325 MG EC tablet Take 1 tablet (325 mg total) by mouth daily with breakfast.  . calcium carbonate (OSCAL) 1500 (600 Ca) MG TABS tablet Take 600 mg of elemental calcium by mouth daily.  . cholecalciferol (VITAMIN D) 1000 units tablet Take 1 tablet (1,000 Units total) by mouth daily.  Marland Kitchen docusate sodium (COLACE) 100 MG capsule Take 1 capsule (100 mg total) by mouth 2 (two) times daily.  Marland Kitchen escitalopram (LEXAPRO) 20 MG tablet Take 1 tablet (20 mg total) by mouth daily.  Marland Kitchen gabapentin (NEURONTIN) 300 MG capsule Take 300 mg by mouth at bedtime.   Marland Kitchen letrozole (FEMARA) 2.5 MG tablet Take 1 tablet (2.5 mg total) by mouth daily.  . metFORMIN (GLUCOPHAGE) 500 MG tablet Take 500 mg by mouth 2 (two) times daily with a meal.   . methocarbamol (ROBAXIN) 500 MG tablet Take 500 mg by mouth 4 (four) times daily as needed for muscle spasms.   Marland Kitchen oxyCODONE-acetaminophen (PERCOCET) 7.5-325 MG tablet Take 1 tablet by mouth 2 (two) times daily.  Marland Kitchen oxyCODONE-acetaminophen (PERCOCET/ROXICET) 5-325 MG tablet Take 1 tablet by mouth every 6 (six) hours as needed for severe pain.  Marland Kitchen risperiDONE (RISPERDAL) 1 MG tablet Take 1 tablet (1 mg total) by mouth at bedtime.    . senna (SENOKOT) 8.6 MG tablet Take 2 tablets by mouth at bedtime.  . traZODone (DESYREL) 50 MG tablet Take 1 tablet (50 mg total) by mouth at bedtime.  Marland Kitchen venlafaxine XR (EFFEXOR XR) 150 MG 24 hr capsule Take 1 capsule (150 mg total) by mouth 2 (two) times daily.  . [DISCONTINUED] ALPRAZolam (XANAX) 0.5 MG tablet Take 1 tablet (0.5 mg total) by mouth at bedtime.   No facility-administered encounter medications on file as of 05/28/2018.      Review of Systems In general she is not complaining of any fever chills her weight appears to be relatively stable.  Skin does not complain of rashes or itching surgical site right knee appears to be healing unremarkably.  Head ears eyes nose mouth and throat is not complain of visual changes or sore throat.  Respiratory does not complain of shortness of breath or cough.  Cardiac does not complain of chest pain her edema appears to have improved still has some edema of her right knee typical postop it appears.  GI is not complaining of abdominal pain nausea vomiting diarrhea or constipation.  GU does not complain of dysuria.  Musculoskeletal at this point her joint pain appears to be controlled with the routine Percocet twice daily as well as as needed she also has Robaxin as needed.  Neurologic is not complain of dizziness headache or numbness or syncope.  And psych does not complain at this time of being overtly depressed or anxious does have a history of this this appears stable on current medications and has not really been an issue during her stay here to my knowledge    Temperature is 97.7 pulse 79 respirations 18 blood pressure 138/73  Physical Exam   General this is a pleasant elderly female no distress sitting comfortably in her wheelchair.  Her skin is warm and dry surgical site right knee appears to be unremarkable without drainage bleeding or surrounding erythema concerning for infection-.  Eyes visual acuity appears to be  intact sclera and  conjunctive are clear.  Oropharynx is clear mucous membranes moist.  Chest is clear to auscultation there is no labored breathing.  Heart is regular rate and rhythm without murmur gallop or rub she does not appear to have significant lower extremity edema quite-- I would say trace on the right lower extremity pedal pulses intact  Abdomen is soft nontender with positive bowel sounds.  Musculoskeletal she is able now to stand and use her walker-strength appears to be intact all 4 extremities with still some limitations of her right lower extremity because of the recent knee surgery.  There is some postop edema yet of the knee area- surgical site appears to be benign at this point healing unremarkably.  Neurologic is grossly intact her speech is clear no lateralizing findings.  Psych she is alert and oriented pleasant and appropriate   Labs reviewed: Basic Metabolic Panel: Recent Labs    02/01/18 1011 05/01/18 1152 05/06/18 0538 05/19/18 0405  NA 134* 140 137 138  K 3.6 4.2 4.4 4.5  CL 97* 103 101 102  CO2 27 28 27 30   GLUCOSE 123* 137* 182* 117*  BUN 13 20 16 20   CREATININE 0.77 0.71 0.59 0.80  CALCIUM 8.9 9.0 7.7* 9.2  MG 1.6*  --   --   --    Liver Function Tests: Recent Labs    12/04/17 1218 02/01/18 1011 05/19/18 0405  AST 19 20 13*  ALT 13* 18 11  ALKPHOS 55 48 85  BILITOT 0.6 0.6 0.7  PROT 7.0 5.3* 5.7*  ALBUMIN 4.2 2.4* 3.2*   No results for input(s): LIPASE, AMYLASE in the last 8760 hours. No results for input(s): AMMONIA in the last 8760 hours. CBC: Recent Labs    05/01/18 1152  05/08/18 0817 05/20/18 0530 05/22/18 0725  WBC 7.9   < > 6.4 6.9 4.9  NEUTROABS 5.8  --   --  4.6 3.4  HGB 13.7   < > 10.5* 11.3* 11.5*  HCT 42.0   < > 32.1* 36.8 37.9  MCV 91.3   < > 88.4 94.6 94.0  PLT 258   < > 163 340 342   < > = values in this interval not displayed.   Cardiac Enzymes: No results for input(s): CKTOTAL, CKMB, CKMBINDEX, TROPONINI  in the last 8760 hours. BNP: Invalid input(s): POCBNP CBG: Recent Labs    05/10/18 2133 05/11/18 0738 05/11/18 1115  GLUCAP 99 136* 126*    Procedures and Imaging Studies During Stay: Dg Tibia/fibula Right  Result Date: 05/19/2018 CLINICAL DATA:  Right lower extremity pain and edema. Recent total knee replacement EXAM: RIGHT TIBIA AND FIBULA - 2 VIEW COMPARISON:  05/05/2018 FINDINGS: Nonspecific soft tissue swelling. There is a total knee arthroplasty that is well seated. No periprosthetic fracture. Osteopenia. IMPRESSION: Soft tissue swelling without acute osseous finding. Electronically Signed   By: Monte Fantasia M.D.   On: 05/19/2018 15:59   US Venous Img Lower Unilateral Right  Result Date: 05/19/2018 CLINICAL DATA:  Swelling post knee surgery EXAM: RIGHT LOWER EXTREMITY VENOUS DOPPLER ULTRASOUND TECHNIQUE: Gray-scale sonography with compression, as well as color and duplex ultrasound, were performed to evaluate the deep venous system from the level of the common femoral vein through the popliteal and proximal calf veins. COMPARISON:  None FINDINGS: Normal compressibility of the common femoral, superficial femoral, and popliteal veins, as well as the proximal calf veins. No filling defects to suggest DVT on grayscale or color Doppler imaging. Doppler waveforms show normal direction of  venous flow, normal respiratory phasicity and response to augmentation. Visualized segments of the saphenous venous system normal in caliber and compressibility. Elongated 3.2 x 0.5 x 3.3 cm deep subcutaneous fluid collection anterior to the knee, presumably postoperative. Right calf subcutaneous edema. Survey views of the contralateral common femoral vein are unremarkable. IMPRESSION: No femoropopliteal and no calf DVT in the visualized calf veins. If clinical symptoms are inconsistent or if there are persistent or worsening symptoms, further imaging (possibly involving the iliac veins) may be warranted.  Electronically Signed   By: Lucrezia Europe M.D.   On: 05/19/2018 16:05   Dg Knee Right Port  Result Date: 05/05/2018 CLINICAL DATA:  Status post right knee replacement EXAM: PORTABLE RIGHT KNEE - 1-2 VIEW COMPARISON:  04/22/2018 FINDINGS: Right knee replacement is now seen in satisfactory position. No acute bony or soft tissue abnormality is noted IMPRESSION: Status post right knee replacement. Electronically Signed   By: Inez Catalina M.D.   On: 05/05/2018 10:22    Assessment/Plan:    #1 history of right knee replacement secondary to osteoarthritis- this appears to be stable she has been followed by orthopedics she is weightbearing as tolerated-she continues on Percocet twice daily routinely as well as every 6 hours as needed appears to tolerate this well she is also on Robaxin as needed.  She is on aspirin 325 mg a day DVT prophylaxis-will have orthopedic follow-up. She is on calcium with vitamin D   2.  Postop anemia status post transfusion hemoglobin continues to rise it is 11.5 on lab done on October 25-this will warrant follow-up by primary care provider.  3.-History of type 2 diabetes she is on Glucophage this appears well controlled with blood sugars consistently in the lower 100s her hemoglobin A1c in hospital was 5.8.  4.  History of depression with anxiety continues on numerous medications including Effexor as well as Lexapro in addition to risperidone- this appears to be stable she has been followed by psychiatry.  5.-History of insomnia continues on trazodone at night.  6.  History of diabetic neuropathy continues on Neurontin nightly as well.  7.-  History of breast cancer continues on Letrozole--  She appears to be doing well she will need follow-up by primary care provider as well as orthopedics-she will need PT and OT for continued strengthening and rehab as well as home health care to help with her multiple medical issues she does have someone who is with her great majority of  the time-.  Will discharge with 30 tablets of Percocet with follow-up by orthopedics and primary care provider.  [id number] note greater than 30 minutes spent on this discharge summary- greater than 50% of time spent coordinating a plan of care for numerous diagnoses

## 2018-06-03 ENCOUNTER — Encounter: Payer: Self-pay | Admitting: Orthopedic Surgery

## 2018-06-03 ENCOUNTER — Ambulatory Visit (INDEPENDENT_AMBULATORY_CARE_PROVIDER_SITE_OTHER): Payer: Medicare HMO | Admitting: Orthopedic Surgery

## 2018-06-03 VITALS — BP 144/84 | HR 120 | Ht 64.0 in

## 2018-06-03 DIAGNOSIS — Z96651 Presence of right artificial knee joint: Secondary | ICD-10-CM

## 2018-06-03 MED ORDER — OXYCODONE-ACETAMINOPHEN 5-325 MG PO TABS: 1.0000 | ORAL_TABLET | Freq: Four times a day (QID) | ORAL | 0 refills | Status: AC | PRN

## 2018-06-03 NOTE — Progress Notes (Signed)
Chief Complaint  Patient presents with  . Routine Post Op    05/05/18 right Total knee replacement     Postop day 29 status post right total knee.  Patient also had lumbar fusion about 3 months prior to the knee replacement she is doing great she is walking with a walker she has full extension 115 degrees of knee flexion she is progressing very well  We will reduce her Percocet now down to 5 mg every 6 follow-up in 4 weeks  Encounter Diagnosis  Name Primary?  . S/P total knee replacement, right 05/05/18 Yes

## 2018-06-05 ENCOUNTER — Telehealth: Payer: Self-pay | Admitting: Orthopedic Surgery

## 2018-06-05 ENCOUNTER — Emergency Department (HOSPITAL_COMMUNITY): Payer: Medicare HMO

## 2018-06-05 ENCOUNTER — Emergency Department (HOSPITAL_COMMUNITY)
Admission: EM | Admit: 2018-06-05 | Discharge: 2018-06-05 | Disposition: A | Discharge status: Home / Self Care | Payer: Medicare HMO | Attending: Emergency Medicine | Admitting: Emergency Medicine

## 2018-06-05 ENCOUNTER — Other Ambulatory Visit (HOSPITAL_COMMUNITY): Payer: Self-pay | Admitting: *Deleted

## 2018-06-05 ENCOUNTER — Other Ambulatory Visit: Payer: Self-pay

## 2018-06-05 ENCOUNTER — Encounter (HOSPITAL_COMMUNITY): Payer: Self-pay | Admitting: Emergency Medicine

## 2018-06-05 DIAGNOSIS — Z7982 Long term (current) use of aspirin: Secondary | ICD-10-CM | POA: Insufficient documentation

## 2018-06-05 DIAGNOSIS — Z7984 Long term (current) use of oral hypoglycemic drugs: Secondary | ICD-10-CM | POA: Diagnosis not present

## 2018-06-05 DIAGNOSIS — Z853 Personal history of malignant neoplasm of breast: Secondary | ICD-10-CM | POA: Diagnosis not present

## 2018-06-05 DIAGNOSIS — R600 Localized edema: Secondary | ICD-10-CM | POA: Insufficient documentation

## 2018-06-05 DIAGNOSIS — Z87891 Personal history of nicotine dependence: Secondary | ICD-10-CM | POA: Insufficient documentation

## 2018-06-05 DIAGNOSIS — Z96651 Presence of right artificial knee joint: Secondary | ICD-10-CM | POA: Insufficient documentation

## 2018-06-05 DIAGNOSIS — E1140 Type 2 diabetes mellitus with diabetic neuropathy, unspecified: Secondary | ICD-10-CM | POA: Diagnosis not present

## 2018-06-05 DIAGNOSIS — C50911 Malignant neoplasm of unspecified site of right female breast: Secondary | ICD-10-CM

## 2018-06-05 DIAGNOSIS — M79604 Pain in right leg: Secondary | ICD-10-CM | POA: Diagnosis not present

## 2018-06-05 NOTE — Telephone Encounter (Signed)
I have discussed with Dr Aline Brochure, and he would have liked to see her this am, but I did not call her in time. I have advised her to go to the Emergency room to make sure she is not getting a  Blood clot, she has voiced understanding.

## 2018-06-05 NOTE — Discharge Instructions (Addendum)
Ultrasound showed no evidence of a blood clot.  Elevate leg.  Tylenol for pain.  Follow-up with your orthopedic surgeon.

## 2018-06-05 NOTE — ED Provider Notes (Signed)
[facility] EMERGENCY DEPARTMENT Provider Note   CSN: [id number] Arrival date & time: 06/05/18  1238     History   Chief Complaint Chief Complaint  Patient presents with  . Leg Pain    HPI Amy [name] is a 82 y.o. female.  Pain and swelling in right calf for the past 24 hours.  Patient is status post right total knee replacement on 05/05/2018 by Dr. Arther Abbott.  She has had two negative Doppler studies of his right lower extremity.  Severity of symptoms is moderate.  Palpation makes symptoms worse.     Past Medical History:  Diagnosis Date  . Anxiety   . Arthritis   . Back pain   . Colon polyps   . Depression   . Diabetes mellitus, type II ([place])    diet controlled  . Hypercholesterolemia   . Invasive ductal carcinoma of breast, female, right ([name])   . Osteopenia 11/26/2016  . Skin-picking disorder    has 3 open wounds on right wrist that are being treated. About a quarter in size.    Patient Active Problem List   Diagnosis Date Noted  . Anemia 05/12/2018  . S/P total knee replacement, right 05/05/18 05/12/2018  . Type 2 diabetes mellitus with neurological complications ([place]) 11/91/4782  . Primary osteoarthritis of right knee   . Status post surgery 01/26/2018  . Surgery, elective 01/26/2018  . Degenerative spondylolisthesis 01/26/2018  . Osteoporosis 12/13/2016  . Osteopenia 11/26/2016  . Invasive ductal carcinoma of breast, female, right ([name])   . Lumbago 03/22/2014  . Stiffness of joint, not elsewhere classified, pelvic region and thigh 03/22/2014  . Muscle weakness (generalized) 03/22/2014  . Abnormality of gait 03/22/2014  . Depression with anxiety 09/03/2012  . Insomnia secondary to depression with anxiety 09/03/2012  . Pain 09/03/2012    Past Surgical History:  Procedure Laterality Date  . APPENDECTOMY    . BREAST BIOPSY Right 10/09/2016   Procedure: BREAST BIOPSY WITH NEEDLE LOCALIZATION;  Surgeon: [name], MD;  Location: [facility];   Service: General;  Laterality: Right;  . BREAST SURGERY Right   . BUNIONECTOMY Bilateral   . FACIAL COSMETIC SURGERY     drooping eyelid and brow  . HERNIA REPAIR Right    Inguinal x2  . KNEE ARTHROSCOPY Right   . SHOULDER SURGERY Right    rotator cuff repair and removal of bone spur  . TEMPOROMANDIBULAR JOINT SURGERY Right   . TOTAL KNEE ARTHROPLASTY Right 05/05/2018   Procedure: TOTAL KNEE ARTHROPLASTY;  Surgeon: [name], MD;  Location: [facility];  Service: Orthopedics;  Laterality: Right;     OB History   None      Home Medications    Prior to Admission medications   Medication Sig Start Date End Date Taking? Authorizing Provider  aspirin EC 325 MG EC tablet Take 1 tablet (325 mg total) by mouth daily with breakfast. 05/08/18  Yes [name], MD  calcium carbonate (OSCAL) 1500 (600 Ca) MG TABS tablet Take 600 mg of elemental calcium by mouth daily.   Yes [provider]  cholecalciferol (VITAMIN D) 1000 units tablet Take 1 tablet (1,000 Units total) by mouth daily. 10/23/16  Yes Kefalas, Manon Hilding, PA-C  docusate sodium (COLACE) 100 MG capsule Take 1 capsule (100 mg total) by mouth 2 (two) times daily. 05/08/18  Yes [name], MD  escitalopram (LEXAPRO) 20 MG tablet Take 1 tablet (20 mg total) by mouth daily. 04/14/18 08/31/19 Yes  Cloria Spring, MD  gabapentin (NEURONTIN) 300 MG capsule Take 300 mg by mouth at bedtime.  03/27/18  Yes [provider]  Glycerin-Hypromellose-PEG 400 (HM DRY EYE RELIEF) 0.2-0.2-1 % SOLN Apply 1 drop to eye daily as needed (dry eyes).   Yes [provider]  letrozole (FEMARA) 2.5 MG tablet Take 1 tablet (2.5 mg total) by mouth daily. 01/21/18  Yes Higgs, Mathis Dad, MD  metFORMIN (GLUCOPHAGE) 500 MG tablet Take 500 mg by mouth 2 (two) times daily with a meal.  06/30/16  Yes [provider]  methocarbamol (ROBAXIN) 500 MG tablet Take 500 mg by mouth 4 (four) times daily as needed for muscle spasms.   03/29/18  Yes [provider]  oxyCODONE-acetaminophen (PERCOCET/ROXICET) 5-325 MG tablet Take 1 tablet by mouth every 6 (six) hours as needed for up to 7 days for severe pain. 06/03/18 06/10/18 Yes [name], MD  risperiDONE (RISPERDAL) 1 MG tablet Take 1 tablet (1 mg total) by mouth at bedtime. 05/08/18 05/08/19 Yes [name], MD  senna (SENOKOT) 8.6 MG tablet Take 2 tablets by mouth at bedtime.   Yes [provider]  traZODone (DESYREL) 50 MG tablet Take 1 tablet (50 mg total) by mouth at bedtime. 05/08/18  Yes [name], MD  venlafaxine XR (EFFEXOR XR) 150 MG 24 hr capsule Take 1 capsule (150 mg total) by mouth 2 (two) times daily. 12/26/17  Yes Cloria Spring, MD    Family History Family History  Problem Relation Age of Onset  . Anxiety disorder Mother   . Depression Mother   . Depression Maternal Grandmother   . Alcohol abuse Brother   . Cancer - Other Sister   . ADD / ADHD Neg Hx   . Bipolar disorder Neg Hx   . Dementia Neg Hx   . Drug abuse Neg Hx   . OCD Neg Hx   . Paranoid behavior Neg Hx   . Schizophrenia Neg Hx   . Seizures Neg Hx   . Sexual abuse Neg Hx   . Physical abuse Neg Hx     Social History Social History   Tobacco Use  . Smoking status: Former Smoker    Packs/day: 0.50    Years: 40.00    Pack years: 20.00    Types: Cigarettes    Last attempt to quit: 09/04/1995    Years since quitting: 22.7  . Smokeless tobacco: Never Used  Substance Use Topics  . Alcohol use: No  . Drug use: No    Types: Benzodiazepines, Hydrocodone     Allergies   Cymbalta [duloxetine hcl]   Review of Systems Review of Systems  All other systems reviewed and are negative.    Physical Exam Updated Vital Signs BP (!) 162/83 (BP Location: Right Arm)   Pulse 91   Temp 97.7 F (36.5 C) (Oral)   Resp 14   Ht 5\' 4"  (1.626 m)   Wt 70.3 kg   SpO2 100%   BMI 26.61 kg/m   Physical Exam  Constitutional: She is oriented to  person, place, and time. She appears well-developed and well-nourished.  HENT:  Head: Normocephalic and atraumatic.  Eyes: Conjunctivae are normal.  Neck: Neck supple.  Cardiovascular: Normal rate and regular rhythm.  Pulmonary/Chest: Effort normal and breath sounds normal.  Abdominal: Soft. Bowel sounds are normal.  Musculoskeletal:  Right lower extremity: Minimal edema and tenderness in the posterior calf  Neurological: She is alert and oriented to person, place, and  time.  Skin: Skin is warm and dry.  Psychiatric: She has a normal mood and affect. Her behavior is normal.  Nursing note and vitals reviewed.    ED Treatments / Results  Labs (all labs ordered are listed, but only abnormal results are displayed) Labs Reviewed - No data to display  EKG None  Radiology US Venous Img Lower Unilateral Right  Result Date: 06/05/2018 CLINICAL DATA:  Right lower extremity pain and edema for the past day. History of breast cancer. Former smoker. Evaluate for DVT. EXAM: RIGHT LOWER EXTREMITY VENOUS DOPPLER ULTRASOUND TECHNIQUE: Gray-scale sonography with graded compression, as well as color Doppler and duplex ultrasound were performed to evaluate the lower extremity deep venous systems from the level of the common femoral vein and including the common femoral, femoral, profunda femoral, popliteal and calf veins including the posterior tibial, peroneal and gastrocnemius veins when visible. The superficial great saphenous vein was also interrogated. Spectral Doppler was utilized to evaluate flow at rest and with distal augmentation maneuvers in the common femoral, femoral and popliteal veins. COMPARISON:  None. FINDINGS: Contralateral Common Femoral Vein: Respiratory phasicity is normal and symmetric with the symptomatic side. No evidence of thrombus. Normal compressibility. Common Femoral Vein: No evidence of thrombus. Normal compressibility, respiratory phasicity and response to augmentation.  Saphenofemoral Junction: No evidence of thrombus. Normal compressibility and flow on color Doppler imaging. Profunda Femoral Vein: No evidence of thrombus. Normal compressibility and flow on color Doppler imaging. Femoral Vein: No evidence of thrombus. Normal compressibility, respiratory phasicity and response to augmentation. Popliteal Vein: No evidence of thrombus. Normal compressibility, respiratory phasicity and response to augmentation. Calf Veins: Appear patent where imaged. Superficial Great Saphenous Vein: No evidence of thrombus. Normal compressibility. Other Findings:  None. IMPRESSION: No evidence of DVT within the right lower extremity. Electronically Signed   By: Sandi Mariscal M.D.   On: 06/05/2018 14:23    Procedures Procedures (including critical care time)  Medications Ordered in ED Medications - No data to display   Initial Impression / Assessment and Plan / ED Course  I have reviewed the triage vital signs and the nursing notes.  Pertinent labs & imaging results that were available during my care of the patient were reviewed by me and considered in my medical decision making (see chart for details).     Patient presents with pain and swelling in her right calf status post right total knee replacement recently.  Doppler study negative.  Discussed with patient and her family  Final Clinical Impressions(s) / ED Diagnoses   Final diagnoses:  Right leg pain    ED Discharge Orders    None       Nat Christen, MD 06/05/18 1627

## 2018-06-05 NOTE — ED Triage Notes (Signed)
Patient states she had knee replacement October 8 and started having pain and swelling to lower right leg since yesterday. States pain started at ankle and is now in outer right knee area.

## 2018-06-05 NOTE — Telephone Encounter (Signed)
Patient called for advice - states her operative knee, "from the knee down to ankle" is swelling.  Pharmacy is [facility] on [address] if need this information. Please call [phone]

## 2018-06-08 ENCOUNTER — Inpatient Hospital Stay (HOSPITAL_COMMUNITY): Payer: Medicare HMO | Attending: Hematology

## 2018-06-08 DIAGNOSIS — C50911 Malignant neoplasm of unspecified site of right female breast: Secondary | ICD-10-CM

## 2018-06-08 DIAGNOSIS — M810 Age-related osteoporosis without current pathological fracture: Secondary | ICD-10-CM | POA: Insufficient documentation

## 2018-06-08 DIAGNOSIS — Z79899 Other long term (current) drug therapy: Secondary | ICD-10-CM | POA: Diagnosis not present

## 2018-06-08 LAB — COMPREHENSIVE METABOLIC PANEL
ALT: 13 U/L (ref 0–44)
AST: 19 U/L (ref 15–41)
Albumin: 3.9 g/dL (ref 3.5–5.0)
Alkaline Phosphatase: 91 U/L (ref 38–126)
Anion gap: 10 (ref 5–15)
BUN: 21 mg/dL (ref 8–23)
CO2: 27 mmol/L (ref 22–32)
Calcium: 9.5 mg/dL (ref 8.9–10.3)
Chloride: 103 mmol/L (ref 98–111)
Creatinine, Ser: 0.84 mg/dL (ref 0.44–1.00)
GFR calc Af Amer: >60 mL/min (ref >60)
GFR calc non Af Amer: >60 mL/min (ref >60)
Glucose, Bld: 134 mg/dL — ABNORMAL HIGH (ref 70–99)
Potassium: 4.5 mmol/L (ref 3.5–5.1)
Sodium: 140 mmol/L (ref 135–145)
Total Bilirubin: 0.5 mg/dL (ref 0.3–1.2)
Total Protein: 6.7 g/dL (ref 6.5–8.1)

## 2018-06-08 LAB — CBC WITH DIFFERENTIAL/PLATELET
Abs Immature Granulocytes: 0.03 10*3/uL (ref 0.00–0.07)
Basophils Absolute: 0 10*3/uL (ref 0.0–0.1)
Basophils Relative: 0 %
Eosinophils Absolute: 0.3 10*3/uL (ref 0.0–0.5)
Eosinophils Relative: 5 %
HCT: 44 % (ref 36.0–46.0)
Hemoglobin: 13.7 g/dL (ref 12.0–15.0)
Immature Granulocytes: 0 %
Lymphocytes Relative: 18 %
Lymphs Abs: 1.3 10*3/uL (ref 0.7–4.0)
MCH: 29.3 pg (ref 26.0–34.0)
MCHC: 31.1 g/dL (ref 30.0–36.0)
MCV: 94 fL (ref 80.0–100.0)
Monocytes Absolute: 0.5 10*3/uL (ref 0.1–1.0)
Monocytes Relative: 7 %
Neutro Abs: 4.9 10*3/uL (ref 1.7–7.7)
Neutrophils Relative %: 70 %
Platelets: 269 10*3/uL (ref 150–400)
RBC: 4.68 MIL/uL (ref 3.87–5.11)
RDW: 13.6 % (ref 11.5–15.5)
WBC: 7.1 10*3/uL (ref 4.0–10.5)
nRBC: 0 % (ref 0.0–0.2)

## 2018-06-08 LAB — LACTATE DEHYDROGENASE: LDH: 166 U/L (ref 98–192)

## 2018-06-09 ENCOUNTER — Ambulatory Visit (HOSPITAL_COMMUNITY): Payer: Self-pay

## 2018-06-09 ENCOUNTER — Other Ambulatory Visit (HOSPITAL_COMMUNITY): Payer: Self-pay

## 2018-06-09 ENCOUNTER — Inpatient Hospital Stay (HOSPITAL_COMMUNITY): Payer: Medicare HMO | Attending: Hematology | Admitting: Hematology

## 2018-06-09 ENCOUNTER — Ambulatory Visit (HOSPITAL_COMMUNITY): Payer: Self-pay | Admitting: Hematology

## 2018-06-09 ENCOUNTER — Inpatient Hospital Stay (HOSPITAL_COMMUNITY): Payer: Medicare HMO

## 2018-06-09 ENCOUNTER — Ambulatory Visit (HOSPITAL_COMMUNITY): Payer: Self-pay | Admitting: Adult Health

## 2018-06-09 ENCOUNTER — Encounter (HOSPITAL_COMMUNITY): Payer: Self-pay | Admitting: Hematology

## 2018-06-09 ENCOUNTER — Telehealth: Payer: Self-pay | Admitting: Orthopedic Surgery

## 2018-06-09 ENCOUNTER — Other Ambulatory Visit: Payer: Self-pay

## 2018-06-09 DIAGNOSIS — Z170 Estrogen receptor positive status [ER+]: Secondary | ICD-10-CM | POA: Diagnosis not present

## 2018-06-09 DIAGNOSIS — Z79811 Long term (current) use of aromatase inhibitors: Secondary | ICD-10-CM | POA: Insufficient documentation

## 2018-06-09 DIAGNOSIS — Z7982 Long term (current) use of aspirin: Secondary | ICD-10-CM | POA: Diagnosis not present

## 2018-06-09 DIAGNOSIS — M8580 Other specified disorders of bone density and structure, unspecified site: Secondary | ICD-10-CM | POA: Insufficient documentation

## 2018-06-09 DIAGNOSIS — C50911 Malignant neoplasm of unspecified site of right female breast: Secondary | ICD-10-CM

## 2018-06-09 DIAGNOSIS — Z79899 Other long term (current) drug therapy: Secondary | ICD-10-CM

## 2018-06-09 DIAGNOSIS — M818 Other osteoporosis without current pathological fracture: Secondary | ICD-10-CM

## 2018-06-09 DIAGNOSIS — M810 Age-related osteoporosis without current pathological fracture: Secondary | ICD-10-CM | POA: Diagnosis not present

## 2018-06-09 MED ORDER — DENOSUMAB 60 MG/ML ~~LOC~~ SOSY
60.0000 mg | PREFILLED_SYRINGE | Freq: Once | SUBCUTANEOUS | Status: AC
  Administered 2018-06-09: 60 mg via SUBCUTANEOUS
  Filled 2018-06-09: qty 1

## 2018-06-09 NOTE — Patient Instructions (Signed)
[facility]  Discharge Instructions:  Prolia  _______________________________________________________________  Thank you for choosing [facility] to provide your oncology and hematology care.  To afford each patient quality time with our providers, please arrive at least 15 minutes before your scheduled appointment.  You need to re-schedule your appointment if you arrive 10 or more minutes late.  We strive to give you quality time with our providers, and arriving late affects you and other patients whose appointments are after yours.  Also, if you no show three or more times for appointments you may be dismissed from the clinic.  Again, thank you for choosing [facility]. Our hope is that these requests will allow you access to exceptional care and in a timely manner. _______________________________________________________________  If you have questions after your visit, please contact our office at [phone] between the hours of 8:30 a.m. and 5:00 p.m. Voicemails left after 4:30 p.m. will not be returned until the following business day. _______________________________________________________________  For prescription refill requests, have your pharmacy contact our office. _______________________________________________________________  Recommendations made by the consultant and any test results will be sent to your referring physician. _______________________________________________________________ 

## 2018-06-09 NOTE — Telephone Encounter (Signed)
Call received Darlina Guys, [facility] ph [phone] to notify that home therapy will begin 06/11/18. States has received orders.

## 2018-06-09 NOTE — Progress Notes (Signed)
Patient here for Prolia injection and office visit with Dr. Delton Coombes. Vss. Labs reviewed. Per MD, " Give Prolia today.   No complaints at this time. Discharged from clinic via wheel chair. F/U with [facility] as scheduled.

## 2018-06-09 NOTE — Progress Notes (Signed)
Patient Care Team: Sinda Du, MD as PCP - General (Internal Medicine)  DIAGNOSIS:  Encounter Diagnosis  Name Primary?  . Invasive ductal carcinoma of breast, female, right ([place])     SUMMARY OF ONCOLOGIC HISTORY:   Invasive ductal carcinoma of breast, female, right ([place])   10/09/2016 Procedure    Left breast lumpectomy by Dr. Arnoldo Morale    10/16/2016 Pathology Results    Invasive, grade 1, ductal carcinoma, spanning 0.18 cmIn greatest dimension.  Low and intermediate grade DCIS with associated calcifications.  Intraductal papilloma formation with associated low-grade DCIS.  DCIS is less than 0.1 cm from the margin in multiple areas including intermediate ductal carcinoma in situ adjacent to cauterized edge.     10/16/2016 Pathology Results    ER +100%, PR +90%, HER-2 negative.    10/23/2016 Cancer Staging    Cancer Staging Invasive ductal carcinoma of breast, female, right [facility]) Staging form: Breast, AJCC 8th Edition - Pathologic stage from 10/23/2016: Stage Unknown (pT1a, pNX, cM0, G1, ER: Positive, PR: Positive, HER2: Negative) - Signed by Baird Cancer, PA-C on 10/23/2016     11/14/2016 Imaging    CT CAP-  1. No compelling findings of metastatic breast cancer. 2. Acute mild diverticulitis of the descending colon. 3. Further distally in the descending colon but there is a cluster of small lymph nodes adjacent to the colon. Although possibly related to the diverticulitis this is a bit distend and this type of clustered nodes is unusual in this location. Following resolution of the acute process, consider colonoscopy to exclude an underlying occult cancer. 4. 3.7 cm fatty mass along the lumen of the descending colon favors a lipoma or fatty polyp. 5. Aortic Atherosclerosis (ICD10-I70.0). 6. Minimal tree-in-bud reticulonodular opacities in the left lower lobe characteristic for atypical infectious bronchiolitis. 7. Left groin hernia containing adipose tissue, probably a  direct inguinal hernia. 8. Exaggerated lumbar lordosis with grade 1 degenerative anterolisthesis at L4-5.    11/26/2016 Imaging    Bone density- BMD as determined from Femur Neck Left is 0.820 g/cm2 with a T-Score of -1.6.  This patient is considered OSTEOPENIC according to [facility]) criteria.     CHIEF COMPLIANT:   INTERVAL HISTORY: Amy [name] is a 82 year old very pleasant white female who is seen in follow-up visit for her right breast cancer.  She has been taking letrozole without fail.  She is tolerating it very well.  Denies any major hot flashes or musculoskeletal symptoms.  She had recent back surgery and knee surgery done and has minor pains from it.  Denies any new onset pains.  Denies any fevers, night sweats or significant weight loss.  She is continuing calcium and vitamin D supplements.  She is continuing to tolerate Prolia very well.  She is drinking 2 cans of Ensure daily.  Appetite is 75% and energy is 50%.  REVIEW OF SYSTEMS:   Constitutional: Denies fevers, chills or abnormal weight loss Eyes: Denies blurriness of vision Ears, nose, mouth, throat, and face: Denies mucositis or sore throat Respiratory: Denies cough, dyspnea or wheezes Cardiovascular: Denies palpitation, chest discomfort.  Positive for leg swelling. Gastrointestinal:  Denies nausea, heartburn or change in bowel habits Skin: Denies abnormal skin rashes Lymphatics: Denies new lymphadenopathy or easy bruising Neurological: Positive for numbness in the hands. Behavioral/Psych: Mood is stable, no new changes  Extremities: No lower extremity edema Breast:  denies any pain or lumps or nodules in either breasts All other systems were reviewed with the  patient and are negative.  I have reviewed the past medical history, past surgical history, social history and family history with the patient and they are unchanged from previous note.  ALLERGIES:  is allergic to cymbalta  [duloxetine hcl].  MEDICATIONS:  Current Outpatient Medications  Medication Sig Dispense Refill  . aspirin EC 325 MG EC tablet Take 1 tablet (325 mg total) by mouth daily with breakfast. 30 tablet 0  . calcium carbonate (OSCAL) 1500 (600 Ca) MG TABS tablet Take 600 mg of elemental calcium by mouth daily.    . cholecalciferol (VITAMIN D) 1000 units tablet Take 1 tablet (1,000 Units total) by mouth daily. 30 tablet 11  . docusate sodium (COLACE) 100 MG capsule Take 1 capsule (100 mg total) by mouth 2 (two) times daily. 10 capsule 0  . escitalopram (LEXAPRO) 20 MG tablet Take 1 tablet (20 mg total) by mouth daily. 30 tablet 2  . gabapentin (NEURONTIN) 300 MG capsule Take 300 mg by mouth at bedtime.   5  . Glycerin-Hypromellose-PEG 400 (HM DRY EYE RELIEF) 0.2-0.2-1 % SOLN Apply 1 drop to eye daily as needed (dry eyes).    Marland Kitchen letrozole (FEMARA) 2.5 MG tablet Take 1 tablet (2.5 mg total) by mouth daily. 30 tablet 3  . metFORMIN (GLUCOPHAGE) 500 MG tablet Take 500 mg by mouth 2 (two) times daily with a meal.     . oxyCODONE-acetaminophen (PERCOCET/ROXICET) 5-325 MG tablet Take 1 tablet by mouth every 6 (six) hours as needed for up to 7 days for severe pain. 28 tablet 0  . risperiDONE (RISPERDAL) 1 MG tablet Take 1 tablet (1 mg total) by mouth at bedtime. 30 tablet 2  . traZODone (DESYREL) 50 MG tablet Take 1 tablet (50 mg total) by mouth at bedtime. 30 tablet 2  . venlafaxine XR (EFFEXOR XR) 150 MG 24 hr capsule Take 1 capsule (150 mg total) by mouth 2 (two) times daily. 60 capsule 2  . methocarbamol (ROBAXIN) 500 MG tablet Take 500 mg by mouth 4 (four) times daily as needed for muscle spasms.   0  . senna (SENOKOT) 8.6 MG tablet Take 2 tablets by mouth at bedtime.     No current facility-administered medications for this visit.     PHYSICAL EXAMINATION: ECOG PERFORMANCE STATUS: 2 - Symptomatic, <50% confined to bed  Vitals:   06/09/18 1432  BP: 113/66  Pulse: (!) 101  Resp: 18  SpO2: 98%    There were no vitals filed for this visit.  GENERAL:alert, no distress and comfortable SKIN: skin color, texture, turgor are normal, no rashes or significant lesions EYES: normal, Conjunctiva are pink and non-injected, sclera clear OROPHARYNX:no mucositis, no erythema and lips, buccal mucosa, and tongue normal  LYMPH:  no palpable lymphadenopathy in the cervical, axillary or inguinal LUNGS: clear to auscultation and percussion with normal breathing effort HEART: regular rate & rhythm and no murmurs and no lower extremity edema ABDOMEN:abdomen soft, non-tender and normal bowel sounds MUSCULOSKELETAL:no cyanosis of digits and no clubbing  EXTREMITIES: No lower extremity edema BREAST: Right lumpectomy site is within normal limits.  No palpable masses in bilateral breast.  No palpable axillary or supraclavicular adenopathy.  LABORATORY DATA:  I have reviewed the data as listed CMP Latest Ref Rng & Units 06/08/2018 05/19/2018 05/06/2018  Glucose 70 - 99 mg/dL 134(H) 117(H) 182(H)  BUN 8 - 23 mg/dL '21 20 16  '$ Creatinine 0.44 - 1.00 mg/dL 0.84 0.80 0.59  Sodium 135 - 145 mmol/L 140 138 137  Potassium 3.5 - 5.1 mmol/L 4.5 4.5 4.4  Chloride 98 - 111 mmol/L 103 102 101  CO2 22 - 32 mmol/L '27 30 27  '$ Calcium 8.9 - 10.3 mg/dL 9.5 9.2 7.7(L)  Total Protein 6.5 - 8.1 g/dL 6.7 5.7(L) -  Total Bilirubin 0.3 - 1.2 mg/dL 0.5 0.7 -  Alkaline Phos 38 - 126 U/L 91 85 -  AST 15 - 41 U/L 19 13(L) -  ALT 0 - 44 U/L 13 11 -   No results found for: [id number]   Lab Results  Component Value Date   WBC 7.1 06/08/2018   HGB 13.7 06/08/2018   HCT 44.0 06/08/2018   MCV 94.0 06/08/2018   PLT 269 06/08/2018   NEUTROABS 4.9 06/08/2018    ASSESSMENT & PLAN:  Invasive ductal carcinoma of breast, female, right ([place]) 1.  Stage Ia (PT 1 a PN 0) IDC of the right breast, ER/PR positive, HER-2 negative: -Status post lumpectomy with Dr. Arnoldo Morale on 10/09/2016 - Received no radiation.  Started on anastrozole in March  2018.  Did not take anastrozole from October 2018 through April 2019.  This is likely from intolerance. -She is currently taking letrozole 2.5 mg without any major problems.  Denies any major hot flashes or musculoskeletal symptoms. -Denies any new onset pains. -Today's physical examination did not reveal any palpable masses in bilateral breast.  Right lumpectomy site in the medial quadrant is within normal limits. -I reviewed mammogram dated 10/14/2017 which was BI-RADS Category 2.  I will see her back in 6 months for follow-up.  2.  Osteopenia: -I have reviewed last DEXA scan dated 11/26/2017 showing a T score of -1.7. -She was started on Prolia every 6 months on 12/19/2016.  Last Prolia was on 12/04/2017.  Her calcium is within normal limits.  Today she may proceed with Prolia injection.  Breast Cancer therapy associated bone loss: I have recommended calcium, Vitamin D and weight bearing exercises.  I spent 25 minutes talking to the patient of which more than half was spent in counseling and coordination of care.  Orders Placed This Encounter  Procedures  . Comprehensive metabolic panel    Standing Status:   Future    Standing Expiration Date:   06/09/2019  . CBC with Differential    Standing Status:   Future    Standing Expiration Date:   06/09/2019  . Cancer antigen 15-3    Standing Status:   Future    Standing Expiration Date:   06/09/2019   The patient has a good understanding of the overall plan. she agrees with it. she will call with any problems that may develop before the next visit here.   Derek Jack, MD 06/09/18

## 2018-06-09 NOTE — Assessment & Plan Note (Signed)
1.  Stage Ia (PT 1 a PN 0) IDC of the right breast, ER/PR positive, HER-2 negative: -Status post lumpectomy with Dr. Arnoldo Morale on 10/09/2016 - Received no radiation.  Started on anastrozole in March 2018.  Did not take anastrozole from October 2018 through April 2019.  This is likely from intolerance. -She is currently taking letrozole 2.5 mg without any major problems.  Denies any major hot flashes or musculoskeletal symptoms. -Denies any new onset pains. -Today's physical examination did not reveal any palpable masses in bilateral breast.  Right lumpectomy site in the medial quadrant is within normal limits. -I reviewed mammogram dated 10/14/2017 which was BI-RADS Category 2.  I will see her back in 6 months for follow-up.  2.  Osteopenia: -I have reviewed last DEXA scan dated 11/26/2017 showing a T score of -1.7. -She was started on Prolia every 6 months on 12/19/2016.  Last Prolia was on 12/04/2017.  Her calcium is within normal limits.  Today she may proceed with Prolia injection.

## 2018-06-10 DIAGNOSIS — M4156 Other secondary scoliosis, lumbar region: Secondary | ICD-10-CM | POA: Diagnosis not present

## 2018-06-12 DIAGNOSIS — Z853 Personal history of malignant neoplasm of breast: Secondary | ICD-10-CM | POA: Diagnosis not present

## 2018-06-12 DIAGNOSIS — Z7984 Long term (current) use of oral hypoglycemic drugs: Secondary | ICD-10-CM | POA: Diagnosis not present

## 2018-06-12 DIAGNOSIS — Z471 Aftercare following joint replacement surgery: Secondary | ICD-10-CM | POA: Diagnosis not present

## 2018-06-12 DIAGNOSIS — E1140 Type 2 diabetes mellitus with diabetic neuropathy, unspecified: Secondary | ICD-10-CM | POA: Diagnosis not present

## 2018-06-12 DIAGNOSIS — M48061 Spinal stenosis, lumbar region without neurogenic claudication: Secondary | ICD-10-CM | POA: Diagnosis not present

## 2018-06-12 DIAGNOSIS — Z96651 Presence of right artificial knee joint: Secondary | ICD-10-CM | POA: Diagnosis not present

## 2018-06-12 DIAGNOSIS — M810 Age-related osteoporosis without current pathological fracture: Secondary | ICD-10-CM | POA: Diagnosis not present

## 2018-06-12 DIAGNOSIS — R69 Illness, unspecified: Secondary | ICD-10-CM | POA: Diagnosis not present

## 2018-06-12 DIAGNOSIS — Z7982 Long term (current) use of aspirin: Secondary | ICD-10-CM | POA: Diagnosis not present

## 2018-06-16 ENCOUNTER — Telehealth: Payer: Self-pay | Admitting: Orthopedic Surgery

## 2018-06-16 NOTE — Telephone Encounter (Signed)
Thanks. I have called Amy [name] to give verbal for nursing, 1 x weekly for 3 weeks

## 2018-06-16 NOTE — Telephone Encounter (Signed)
Debra with [facility] left message on voicemail stating that she saw the patient and feels like she will only need 1 week 3 times. Message wasn't very clear about 1 week/3 times  Please call and advise [phone]

## 2018-06-22 ENCOUNTER — Telehealth: Payer: Self-pay | Admitting: Orthopedic Surgery

## 2018-06-22 NOTE — Telephone Encounter (Signed)
Mallory with [facility] called asking for a verbal.  She is asking for the patient to be seen for Homehealth OT   1 time a week for 2 weeks and  2 times for 2 weeks  She asked if it was possible she could get a return call today, she would greatly appreciate it.  Please call and advise  [phone]

## 2018-06-22 NOTE — Telephone Encounter (Signed)
Called with verbal left message

## 2018-07-01 ENCOUNTER — Ambulatory Visit (INDEPENDENT_AMBULATORY_CARE_PROVIDER_SITE_OTHER): Payer: Medicare HMO | Admitting: Orthopedic Surgery

## 2018-07-01 ENCOUNTER — Encounter: Payer: Self-pay | Admitting: Orthopedic Surgery

## 2018-07-01 VITALS — BP 139/90 | HR 95 | Ht 64.0 in

## 2018-07-01 DIAGNOSIS — M545 Low back pain, unspecified: Secondary | ICD-10-CM

## 2018-07-01 DIAGNOSIS — Z96651 Presence of right artificial knee joint: Secondary | ICD-10-CM

## 2018-07-01 MED ORDER — IBUPROFEN 800 MG PO TABS: 800.0000 mg | ORAL_TABLET | Freq: Three times a day (TID) | ORAL | 1 refills | Status: DC | PRN

## 2018-07-01 MED ORDER — HYDROCODONE-ACETAMINOPHEN 5-325 MG PO TABS: 1.0000 | ORAL_TABLET | Freq: Four times a day (QID) | ORAL | 0 refills | Status: DC | PRN

## 2018-07-01 NOTE — Progress Notes (Signed)
Global period post op   POD # 57  57/90  Encounter Diagnoses  Name Primary?  . S/P total knee replacement, right 05/05/18   . Midline low back pain without sciatica, unspecified chronicity Yes   C/o back pain Dr Trenton Gammon  (01/2018) without radiation  She is happy with her knee says she does not have any trouble with that  ROM RT KNEE: 115, surgical site clean dry healed no erythema  FU 9 MONTHS for annual x-ray  Meds ordered this encounter  Medications  . ibuprofen (ADVIL,MOTRIN) 800 MG tablet    Sig: Take 1 tablet (800 mg total) by mouth every 8 (eight) hours as needed.    Dispense:  90 tablet    Refill:  1  . HYDROcodone-acetaminophen (NORCO/VICODIN) 5-325 MG tablet    Sig: Take 1 tablet by mouth every 6 (six) hours as needed for moderate pain.    Dispense:  30 tablet    Refill:  0

## 2018-07-03 ENCOUNTER — Other Ambulatory Visit (HOSPITAL_COMMUNITY): Payer: Self-pay | Admitting: Psychiatry

## 2018-07-14 ENCOUNTER — Ambulatory Visit (HOSPITAL_COMMUNITY): Payer: Medicare HMO | Admitting: Psychiatry

## 2018-07-14 ENCOUNTER — Encounter (HOSPITAL_COMMUNITY): Payer: Self-pay | Admitting: Psychiatry

## 2018-07-14 VITALS — BP 146/86 | HR 96 | Ht 64.0 in | Wt 151.8 lb

## 2018-07-14 DIAGNOSIS — R69 Illness, unspecified: Secondary | ICD-10-CM | POA: Diagnosis not present

## 2018-07-14 DIAGNOSIS — F330 Major depressive disorder, recurrent, mild: Secondary | ICD-10-CM | POA: Diagnosis not present

## 2018-07-14 MED ORDER — VENLAFAXINE HCL ER 150 MG PO CP24: 150.0000 mg | ORAL_CAPSULE | Freq: Two times a day (BID) | ORAL | 2 refills | Status: DC

## 2018-07-14 MED ORDER — TRAZODONE HCL 50 MG PO TABS: 50.0000 mg | ORAL_TABLET | Freq: Every day | ORAL | 2 refills | Status: DC

## 2018-07-14 MED ORDER — ALPRAZOLAM 0.5 MG PO TABS: 0.5000 mg | ORAL_TABLET | Freq: Every day | ORAL | 2 refills | Status: DC | PRN

## 2018-07-14 MED ORDER — RISPERIDONE 1 MG PO TABS: 1.0000 mg | ORAL_TABLET | Freq: Every day | ORAL | 2 refills | Status: DC

## 2018-07-14 MED ORDER — ESCITALOPRAM OXALATE 20 MG PO TABS: 20.0000 mg | ORAL_TABLET | Freq: Every day | ORAL | 2 refills | Status: DC
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
## 2018-07-14 NOTE — Progress Notes (Deleted)
BH MD/PA/NP OP Progress Note  07/14/2018 2:33 PM Amy [name]  MRN:  [id number]  Chief Complaint:  Chief Complaint    Anxiety; Depression; Follow-up     HPI:  Visit Diagnosis:    ICD-10-CM   1. Major depressive disorder, recurrent episode, mild with anxious distress (HCC) F33.0 ALPRAZolam (XANAX) 0.5 MG tablet    Past Psychiatric History: ***  Past Medical History:  Past Medical History:  Diagnosis Date  . Anxiety   . Arthritis   . Back pain   . Colon polyps   . Depression   . Diabetes mellitus, type II ([place])    diet controlled  . Hypercholesterolemia   . Invasive ductal carcinoma of breast, female, right ([place])   . Osteopenia 11/26/2016  . Skin-picking disorder    has 3 open wounds on right wrist that are being treated. About a quarter in size.    Past Surgical History:  Procedure Laterality Date  . APPENDECTOMY    . BREAST BIOPSY Right 10/09/2016   Procedure: BREAST BIOPSY WITH NEEDLE LOCALIZATION;  Surgeon: [name], MD;  Location: [facility];  Service: General;  Laterality: Right;  . BREAST SURGERY Right   . BUNIONECTOMY Bilateral   . FACIAL COSMETIC SURGERY     drooping eyelid and brow  . HERNIA REPAIR Right    Inguinal x2  . KNEE ARTHROSCOPY Right   . SHOULDER SURGERY Right    rotator cuff repair and removal of bone spur  . TEMPOROMANDIBULAR JOINT SURGERY Right   . TOTAL KNEE ARTHROPLASTY Right 05/05/2018   Procedure: TOTAL KNEE ARTHROPLASTY;  Surgeon: [name], MD;  Location: [facility];  Service: Orthopedics;  Laterality: Right;    Family Psychiatric History: ***  Family History:  Family History  Problem Relation Age of Onset  . Anxiety disorder Mother   . Depression Mother   . Depression Maternal Grandmother   . Alcohol abuse Brother   . Cancer - Other Sister   . ADD / ADHD Neg Hx   . Bipolar disorder Neg Hx   . Dementia Neg Hx   . Drug abuse Neg Hx   . OCD Neg Hx   . Paranoid behavior Neg Hx   . Schizophrenia Neg Hx   . Seizures  Neg Hx   . Sexual abuse Neg Hx   . Physical abuse Neg Hx     Social History:  Social History   Socioeconomic History  . Marital status: Divorced    Spouse name: Not on file  . Number of children: Not on file  . Years of education: Not on file  . Highest education level: Not on file  Occupational History  . 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 Financial resource strain: Not hard at all  . Food insecurity:    Worry: Never true    Inability: Never true  . Transportation needs:    Medical: No    Non-medical: No  Tobacco Use  . Smoking status: Former Smoker    Packs/day: 0.50    Years: 40.00    Pack years: 20.00    Types: Cigarettes    Last attempt to quit: 09/04/1995    Years since quitting: 22.8  . Smokeless tobacco: Never Used  Substance and Sexual Activity  . Alcohol use: No  . Drug use: No    Types: Benzodiazepines, Hydrocodone  . Sexual activity: Never  Lifestyle  . Physical activity:    Days per week: 0 days  Minutes per session: 0 min  . Stress: Only a little  Relationships  . Social connections:    Talks on phone: Once a week    Gets together: Never    Attends religious service: More than 4 times per year    Active member of club or organization: No    Attends meetings of clubs or organizations: Never    Relationship status: Divorced  Other Topics Concern  . Not on file  Social History Narrative  . Not on file    Allergies:  Allergies  Allergen Reactions  . Cymbalta [Duloxetine Hcl] Other (See Comments)    Hallucinated and couldn't think; got worse and worse the longer she took this.    Metabolic Disorder Labs: Lab Results  Component Value Date   HGBA1C 5.9 (H) 05/01/2018   MPG 123 05/01/2018   MPG 136.98 01/16/2018   No results found for: PROLACTIN No results found for: CHOL, TRIG, HDL, CHOLHDL, VLDL, LDLCALC No results found for: TSH  Therapeutic Level Labs: No results found for: LITHIUM No results found for:  VALPROATE No components found for:  CBMZ  Current Medications: Current Outpatient Medications  Medication Sig Dispense Refill  . ALPRAZolam (XANAX) 0.5 MG tablet Take 1 tablet (0.5 mg total) by mouth daily as needed for anxiety. for anxiety 30 tablet 2  . aspirin EC 325 MG EC tablet Take 1 tablet (325 mg total) by mouth daily with breakfast. 30 tablet 0  . calcium carbonate (OSCAL) 1500 (600 Ca) MG TABS tablet Take 600 mg of elemental calcium by mouth daily.    . cholecalciferol (VITAMIN D) 1000 units tablet Take 1 tablet (1,000 Units total) by mouth daily. 30 tablet 11  . docusate sodium (COLACE) 100 MG capsule Take 1 capsule (100 mg total) by mouth 2 (two) times daily. 10 capsule 0  . escitalopram (LEXAPRO) 20 MG tablet Take 1 tablet (20 mg total) by mouth daily. 30 tablet 2  . gabapentin (NEURONTIN) 300 MG capsule Take 300 mg by mouth at bedtime.   5  . Glycerin-Hypromellose-PEG 400 (HM DRY EYE RELIEF) 0.2-0.2-1 % SOLN Apply 1 drop to eye daily as needed (dry eyes).    Marland Kitchen HYDROcodone-acetaminophen (NORCO/VICODIN) 5-325 MG tablet Take 1 tablet by mouth every 6 (six) hours as needed for moderate pain. 30 tablet 0  . ibuprofen (ADVIL,MOTRIN) 800 MG tablet Take 1 tablet (800 mg total) by mouth every 8 (eight) hours as needed. 90 tablet 1  . letrozole (FEMARA) 2.5 MG tablet Take 1 tablet (2.5 mg total) by mouth daily. 30 tablet 3  . metFORMIN (GLUCOPHAGE) 500 MG tablet Take 500 mg by mouth 2 (two) times daily with a meal.     . methocarbamol (ROBAXIN) 500 MG tablet Take 500 mg by mouth 4 (four) times daily as needed for muscle spasms.   0  . risperiDONE (RISPERDAL) 1 MG tablet Take 1 tablet (1 mg total) by mouth at bedtime. 30 tablet 2  . senna (SENOKOT) 8.6 MG tablet Take 2 tablets by mouth at bedtime.    . traZODone (DESYREL) 50 MG tablet Take 1 tablet (50 mg total) by mouth at bedtime. 30 tablet 2  . venlafaxine XR (EFFEXOR XR) 150 MG 24 hr capsule Take 1 capsule (150 mg total) by mouth 2 (two)  times daily. 60 capsule 2   No current facility-administered medications for this visit.      Musculoskeletal: Strength & Muscle Tone: {desc; muscle tone:32375} Gait & Station: {PE GAIT ED OZHY:86578} Patient  leans: {Patient Leans:21022755}  Psychiatric Specialty Exam: ROS  Blood pressure (!) 146/86, pulse 96, height 5\' 4"  (1.626 m), weight 151 lb 12.8 oz (68.9 kg), SpO2 98 %.Body mass index is 26.06 kg/m.  General Appearance: {Appearance:22683}  Eye Contact:  {BHH EYE CONTACT:22684}  Speech:  {Speech:22685}  Volume:  {Volume (PAA):22686}  Mood:  {BHH MOOD:22306}  Affect:  {Affect (PAA):22687}  Thought Process:  {Thought Process (PAA):22688}  Orientation:  {BHH ORIENTATION (PAA):22689}  Thought Content: {Thought Content:22690}   Suicidal Thoughts:  {ST/HT (PAA):22692}  Homicidal Thoughts:  {ST/HT (PAA):22692}  Memory:  {BHH MEMORY:22881}  Judgement:  {Judgement (PAA):22694}  Insight:  {Insight (PAA):22695}  Psychomotor Activity:  {Psychomotor (PAA):22696}  Concentration:  {Concentration:21399}  Recall:  {BHH GOOD/FAIR/POOR:22877}  Fund of Knowledge: {BHH GOOD/FAIR/POOR:22877}  Language: {BHH GOOD/FAIR/POOR:22877}  Akathisia:  {BHH YES OR NO:22294}  Handed:  {Handed:22697}  AIMS (if indicated): {Desc; done/not:10129}  Assets:  {Assets (PAA):22698}  ADL's:  {BHH YIF'O:27741}  Cognition: {chl bhh cognition:304700322}  Sleep:  {BHH GOOD/FAIR/POOR:22877}   Screenings: MDI     Office Visit from 02/15/2016 in [facility]  Total Score (max 50)  17       Assessment and Plan: ***   Levonne Spiller, MD 07/14/2018, 2:33 PM

## 2018-08-19 DIAGNOSIS — Z6827 Body mass index (BMI) 27.0-27.9, adult: Secondary | ICD-10-CM | POA: Diagnosis not present

## 2018-08-19 DIAGNOSIS — M4156 Other secondary scoliosis, lumbar region: Secondary | ICD-10-CM | POA: Diagnosis not present

## 2018-08-19 DIAGNOSIS — M4310 Spondylolisthesis, site unspecified: Secondary | ICD-10-CM | POA: Diagnosis not present

## 2018-08-19 DIAGNOSIS — I10 Essential (primary) hypertension: Secondary | ICD-10-CM | POA: Diagnosis not present

## 2018-08-27 ENCOUNTER — Other Ambulatory Visit (HOSPITAL_COMMUNITY): Payer: Self-pay | Admitting: Internal Medicine

## 2018-08-27 DIAGNOSIS — C50911 Malignant neoplasm of unspecified site of right female breast: Secondary | ICD-10-CM

## 2018-08-27 MED ORDER — LETROZOLE 2.5 MG PO TABS: 2.5000 mg | ORAL_TABLET | Freq: Every day | ORAL | 3 refills | Status: DC

## 2018-08-27 NOTE — Progress Notes (Signed)
Chart reviewed, letrozole refilled.

## 2018-08-27 NOTE — Addendum Note (Signed)
Addended by: Donetta Potts on: 08/27/2018 11:33 AM   Modules accepted: Orders

## 2018-10-12 ENCOUNTER — Ambulatory Visit: Payer: Medicare HMO | Admitting: Orthopedic Surgery

## 2018-10-12 ENCOUNTER — Encounter: Payer: Self-pay | Admitting: Orthopedic Surgery

## 2018-10-12 ENCOUNTER — Ambulatory Visit (INDEPENDENT_AMBULATORY_CARE_PROVIDER_SITE_OTHER): Payer: Medicare HMO

## 2018-10-12 ENCOUNTER — Other Ambulatory Visit: Payer: Self-pay

## 2018-10-12 VITALS — Ht 64.0 in | Wt 162.0 lb

## 2018-10-12 DIAGNOSIS — M25861 Other specified joint disorders, right knee: Secondary | ICD-10-CM

## 2018-10-12 DIAGNOSIS — Z96651 Presence of right artificial knee joint: Secondary | ICD-10-CM

## 2018-10-12 DIAGNOSIS — I872 Venous insufficiency (chronic) (peripheral): Secondary | ICD-10-CM

## 2018-10-12 DIAGNOSIS — R600 Localized edema: Secondary | ICD-10-CM | POA: Insufficient documentation

## 2018-10-12 DIAGNOSIS — R609 Edema, unspecified: Secondary | ICD-10-CM | POA: Diagnosis not present

## 2018-10-12 NOTE — Progress Notes (Addendum)
Chief Complaint  Patient presents with  . Post-op Follow-up    Crunching noise right knee  . Gait Problem    wants to walk without walker / can not use cane yet    Status post lumbar fusion followed by total knee May 05, 2018  Complains of lateral knee pain in the peripatellar region and crunching noise when she stands up or straightens the knee  She is also having increased back pain and still requiring her walker although she is anxious to use a cane  Review of Systems  Musculoskeletal: Positive for back pain.   Physical Exam Vitals signs and nursing note reviewed.  Constitutional:      Appearance: Normal appearance.  Neurological:     Mental Status: She is alert and oriented to person, place, and time.  Psychiatric:        Mood and Affect: Mood normal.    She is using a walker she is full weightbearing  She does have mild tenderness in the anterolateral peripatellar area with crepitance consistent with impingement scar tissue in the notch  Flexion 125 Extension 3  Stable in flexion extension  X-ray negative for any acute process see report patella on resurfaced  Encounter Diagnoses  Name Primary?  . S/P total knee replacement, right 05/05/18   . 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 Edema of both lower legs due to peripheral venous insufficiency    16-20 mm compression stockings    Reassess at 1 year for possible removal scar tissue

## 2018-10-14 ENCOUNTER — Ambulatory Visit (HOSPITAL_COMMUNITY): Payer: Medicare HMO | Admitting: Psychiatry

## 2018-10-28 ENCOUNTER — Other Ambulatory Visit (HOSPITAL_COMMUNITY): Payer: Self-pay | Admitting: Psychiatry

## 2018-11-05 ENCOUNTER — Other Ambulatory Visit (HOSPITAL_COMMUNITY): Payer: Self-pay | Admitting: Psychiatry

## 2018-11-05 DIAGNOSIS — F330 Major depressive disorder, recurrent, mild: Secondary | ICD-10-CM

## 2018-11-18 DIAGNOSIS — M4310 Spondylolisthesis, site unspecified: Secondary | ICD-10-CM | POA: Diagnosis not present

## 2018-11-19 ENCOUNTER — Other Ambulatory Visit (HOSPITAL_COMMUNITY): Payer: Self-pay | Admitting: Pulmonary Disease

## 2018-11-23 ENCOUNTER — Other Ambulatory Visit (HOSPITAL_COMMUNITY): Payer: Self-pay | Admitting: Pulmonary Disease

## 2018-11-23 DIAGNOSIS — Z9889 Other specified postprocedural states: Secondary | ICD-10-CM

## 2018-11-25 ENCOUNTER — Other Ambulatory Visit (HOSPITAL_COMMUNITY): Payer: Self-pay | Admitting: Psychiatry

## 2018-12-01 ENCOUNTER — Other Ambulatory Visit: Payer: Self-pay

## 2018-12-01 ENCOUNTER — Ambulatory Visit (HOSPITAL_COMMUNITY): Admission: RE | Admit: 2018-12-01 | Payer: Medicare HMO | Source: Ambulatory Visit

## 2018-12-01 ENCOUNTER — Inpatient Hospital Stay (HOSPITAL_COMMUNITY): Payer: Medicare HMO | Attending: Hematology

## 2018-12-01 ENCOUNTER — Ambulatory Visit (HOSPITAL_COMMUNITY)
Admission: RE | Admit: 2018-12-01 | Discharge: 2018-12-01 | Disposition: A | Discharge status: Home / Self Care | Payer: Medicare HMO | Source: Ambulatory Visit | Attending: Pulmonary Disease | Admitting: Pulmonary Disease

## 2018-12-01 DIAGNOSIS — E119 Type 2 diabetes mellitus without complications: Secondary | ICD-10-CM | POA: Diagnosis not present

## 2018-12-01 DIAGNOSIS — D649 Anemia, unspecified: Secondary | ICD-10-CM | POA: Insufficient documentation

## 2018-12-01 DIAGNOSIS — Z7982 Long term (current) use of aspirin: Secondary | ICD-10-CM | POA: Insufficient documentation

## 2018-12-01 DIAGNOSIS — F329 Major depressive disorder, single episode, unspecified: Secondary | ICD-10-CM | POA: Insufficient documentation

## 2018-12-01 DIAGNOSIS — M129 Arthropathy, unspecified: Secondary | ICD-10-CM | POA: Diagnosis not present

## 2018-12-01 DIAGNOSIS — C50911 Malignant neoplasm of unspecified site of right female breast: Secondary | ICD-10-CM | POA: Diagnosis not present

## 2018-12-01 DIAGNOSIS — Z87891 Personal history of nicotine dependence: Secondary | ICD-10-CM | POA: Diagnosis not present

## 2018-12-01 DIAGNOSIS — Z79811 Long term (current) use of aromatase inhibitors: Secondary | ICD-10-CM | POA: Diagnosis not present

## 2018-12-01 DIAGNOSIS — Z170 Estrogen receptor positive status [ER+]: Secondary | ICD-10-CM | POA: Insufficient documentation

## 2018-12-01 DIAGNOSIS — Z7984 Long term (current) use of oral hypoglycemic drugs: Secondary | ICD-10-CM | POA: Insufficient documentation

## 2018-12-01 DIAGNOSIS — R69 Illness, unspecified: Secondary | ICD-10-CM | POA: Diagnosis not present

## 2018-12-01 DIAGNOSIS — M8580 Other specified disorders of bone density and structure, unspecified site: Secondary | ICD-10-CM | POA: Insufficient documentation

## 2018-12-01 DIAGNOSIS — R921 Mammographic calcification found on diagnostic imaging of breast: Secondary | ICD-10-CM | POA: Insufficient documentation

## 2018-12-01 DIAGNOSIS — E7800 Pure hypercholesterolemia, unspecified: Secondary | ICD-10-CM | POA: Insufficient documentation

## 2018-12-01 DIAGNOSIS — R922 Inconclusive mammogram: Secondary | ICD-10-CM | POA: Diagnosis not present

## 2018-12-01 DIAGNOSIS — Z79899 Other long term (current) drug therapy: Secondary | ICD-10-CM | POA: Insufficient documentation

## 2018-12-01 DIAGNOSIS — C50919 Malignant neoplasm of unspecified site of unspecified female breast: Secondary | ICD-10-CM | POA: Diagnosis not present

## 2018-12-01 DIAGNOSIS — Z9889 Other specified postprocedural states: Secondary | ICD-10-CM

## 2018-12-01 LAB — COMPREHENSIVE METABOLIC PANEL
ALT: 13 U/L (ref 0–44)
AST: 14 U/L — ABNORMAL LOW (ref 15–41)
Albumin: 3.6 g/dL (ref 3.5–5.0)
Alkaline Phosphatase: 68 U/L (ref 38–126)
Anion gap: 12 (ref 5–15)
BUN: 21 mg/dL (ref 8–23)
CO2: 28 mmol/L (ref 22–32)
Calcium: 9.1 mg/dL (ref 8.9–10.3)
Chloride: 97 mmol/L — ABNORMAL LOW (ref 98–111)
Creatinine, Ser: 1.01 mg/dL — ABNORMAL HIGH (ref 0.44–1.00)
GFR calc Af Amer: >60 mL/min (ref >60)
GFR calc non Af Amer: 53 mL/min — ABNORMAL LOW (ref >60)
Glucose, Bld: 203 mg/dL — ABNORMAL HIGH (ref 70–99)
Potassium: 3.8 mmol/L (ref 3.5–5.1)
Sodium: 137 mmol/L (ref 135–145)
Total Bilirubin: 0.9 mg/dL (ref 0.3–1.2)
Total Protein: 6.8 g/dL (ref 6.5–8.1)

## 2018-12-01 LAB — CBC WITH DIFFERENTIAL/PLATELET
Abs Immature Granulocytes: 0.02 10*3/uL (ref 0.00–0.07)
Basophils Absolute: 0 10*3/uL (ref 0.0–0.1)
Basophils Relative: 0 %
Eosinophils Absolute: 0 10*3/uL (ref 0.0–0.5)
Eosinophils Relative: 0 %
HCT: 36.3 % (ref 36.0–46.0)
Hemoglobin: 12 g/dL (ref 12.0–15.0)
Immature Granulocytes: 0 %
Lymphocytes Relative: 9 %
Lymphs Abs: 0.8 10*3/uL (ref 0.7–4.0)
MCH: 30.1 pg (ref 26.0–34.0)
MCHC: 33.1 g/dL (ref 30.0–36.0)
MCV: 91 fL (ref 80.0–100.0)
Monocytes Absolute: 0.7 10*3/uL (ref 0.1–1.0)
Monocytes Relative: 8 %
Neutro Abs: 6.9 10*3/uL (ref 1.7–7.7)
Neutrophils Relative %: 83 %
Platelets: 223 10*3/uL (ref 150–400)
RBC: 3.99 MIL/uL (ref 3.87–5.11)
RDW: 11.9 % (ref 11.5–15.5)
WBC: 8.4 10*3/uL (ref 4.0–10.5)
nRBC: 0 % (ref 0.0–0.2)

## 2018-12-02 DIAGNOSIS — R69 Illness, unspecified: Secondary | ICD-10-CM | POA: Diagnosis not present

## 2018-12-02 LAB — CANCER ANTIGEN 15-3: CA 15-3: 15.7 U/mL (ref 0.0–25.0)

## 2018-12-08 ENCOUNTER — Other Ambulatory Visit (HOSPITAL_COMMUNITY): Payer: Self-pay | Admitting: Nurse Practitioner

## 2018-12-08 ENCOUNTER — Other Ambulatory Visit: Payer: Self-pay

## 2018-12-08 ENCOUNTER — Inpatient Hospital Stay (HOSPITAL_COMMUNITY): Payer: Medicare HMO | Attending: Hematology | Admitting: Nurse Practitioner

## 2018-12-08 ENCOUNTER — Inpatient Hospital Stay (HOSPITAL_COMMUNITY): Payer: Medicare HMO

## 2018-12-08 DIAGNOSIS — R69 Illness, unspecified: Secondary | ICD-10-CM | POA: Diagnosis not present

## 2018-12-08 DIAGNOSIS — F424 Excoriation (skin-picking) disorder: Secondary | ICD-10-CM | POA: Insufficient documentation

## 2018-12-08 DIAGNOSIS — Z170 Estrogen receptor positive status [ER+]: Secondary | ICD-10-CM | POA: Insufficient documentation

## 2018-12-08 DIAGNOSIS — D649 Anemia, unspecified: Secondary | ICD-10-CM

## 2018-12-08 DIAGNOSIS — E119 Type 2 diabetes mellitus without complications: Secondary | ICD-10-CM | POA: Diagnosis not present

## 2018-12-08 DIAGNOSIS — C50911 Malignant neoplasm of unspecified site of right female breast: Secondary | ICD-10-CM | POA: Diagnosis not present

## 2018-12-08 DIAGNOSIS — M129 Arthropathy, unspecified: Secondary | ICD-10-CM | POA: Diagnosis not present

## 2018-12-08 DIAGNOSIS — Z79811 Long term (current) use of aromatase inhibitors: Secondary | ICD-10-CM | POA: Insufficient documentation

## 2018-12-08 DIAGNOSIS — Z87891 Personal history of nicotine dependence: Secondary | ICD-10-CM | POA: Insufficient documentation

## 2018-12-08 DIAGNOSIS — M8580 Other specified disorders of bone density and structure, unspecified site: Secondary | ICD-10-CM | POA: Insufficient documentation

## 2018-12-08 DIAGNOSIS — F329 Major depressive disorder, single episode, unspecified: Secondary | ICD-10-CM | POA: Insufficient documentation

## 2018-12-08 DIAGNOSIS — Z86010 Personal history of colonic polyps: Secondary | ICD-10-CM | POA: Insufficient documentation

## 2018-12-08 DIAGNOSIS — E7800 Pure hypercholesterolemia, unspecified: Secondary | ICD-10-CM | POA: Diagnosis not present

## 2018-12-08 DIAGNOSIS — M818 Other osteoporosis without current pathological fracture: Secondary | ICD-10-CM

## 2018-12-08 DIAGNOSIS — Z7984 Long term (current) use of oral hypoglycemic drugs: Secondary | ICD-10-CM

## 2018-12-08 DIAGNOSIS — Z79899 Other long term (current) drug therapy: Secondary | ICD-10-CM | POA: Insufficient documentation

## 2018-12-08 DIAGNOSIS — Z7982 Long term (current) use of aspirin: Secondary | ICD-10-CM

## 2018-12-08 MED ORDER — DENOSUMAB 60 MG/ML ~~LOC~~ SOSY
60.0000 mg | PREFILLED_SYRINGE | Freq: Once | SUBCUTANEOUS | Status: AC
  Administered 2018-12-08: 60 mg via SUBCUTANEOUS

## 2018-12-08 MED ORDER — DENOSUMAB 60 MG/ML ~~LOC~~ SOSY
PREFILLED_SYRINGE | SUBCUTANEOUS | Status: AC
  Filled 2018-12-08: qty 1

## 2018-12-08 NOTE — Progress Notes (Signed)
Prolia given today per orders. Patient tolerated it well without problems. Vitals stable and discharged home from clinic ambulatory. Follow up as scheduled.

## 2018-12-08 NOTE — Patient Instructions (Signed)
[facility] at [facility] Discharge Instructions     Thank you for choosing [facility] to provide your oncology and hematology care.  To afford each patient quality time with our provider, please arrive at least 15 minutes before your scheduled appointment time.   If you have a lab appointment with the Cancer Center please come in thru the  Main Entrance and check in at the main information desk  You need to re-schedule your appointment should you arrive 10 or more minutes late.  We strive to give you quality time with our providers, and arriving late affects you and other patients whose appointments are after yours.  Also, if you no show three or more times for appointments you may be dismissed from the clinic at the providers discretion.     Again, thank you for choosing [facility].  Our hope is that these requests will decrease the amount of time that you wait before being seen by our physicians.       _____________________________________________________________  Should you have questions after your visit to [facility], please contact our office at [phone] between the hours of 8:00 a.m. and 4:30 p.m.  Voicemails left after 4:00 p.m. will not be returned until the following business day.  For prescription refill requests, have your pharmacy contact our office and allow 72 hours.    Cancer Center Support Programs:   > Cancer Support Group  2nd Tuesday of the month 1pm-2pm, Journey Room    

## 2018-12-08 NOTE — Progress Notes (Signed)
[place],  [address]   CLINIC:  Medical Oncology/Hematology  PCP:  Sinda Du, MD [address] [place] [address] [phone]   REASON FOR VISIT: Follow-up for breast cancer  CURRENT THERAPY: Letrozole  BRIEF ONCOLOGIC HISTORY:    Invasive ductal carcinoma of breast, female, right ([place])   10/09/2016 Procedure    Left breast lumpectomy by Dr. Arnoldo Morale    10/16/2016 Pathology Results    Invasive, grade 1, ductal carcinoma, spanning 0.18 cmIn greatest dimension.  Low and intermediate grade DCIS with associated calcifications.  Intraductal papilloma formation with associated low-grade DCIS.  DCIS is less than 0.1 cm from the margin in multiple areas including intermediate ductal carcinoma in situ adjacent to cauterized edge.     10/16/2016 Pathology Results    ER +100%, PR +90%, HER-2 negative.    10/23/2016 Cancer Staging    Cancer Staging Invasive ductal carcinoma of breast, female, right [facility]) Staging form: Breast, AJCC 8th Edition - Pathologic stage from 10/23/2016: Stage Unknown (pT1a, pNX, cM0, G1, ER: Positive, PR: Positive, HER2: Negative) - Signed by Baird Cancer, PA-C on 10/23/2016     11/14/2016 Imaging    CT CAP-  1. No compelling findings of metastatic breast cancer. 2. Acute mild diverticulitis of the descending colon. 3. Further distally in the descending colon but there is a cluster of small lymph nodes adjacent to the colon. Although possibly related to the diverticulitis this is a bit distend and this type of clustered nodes is unusual in this location. Following resolution of the acute process, consider colonoscopy to exclude an underlying occult cancer. 4. 3.7 cm fatty mass along the lumen of the descending colon favors a lipoma or fatty polyp. 5. Aortic Atherosclerosis (ICD10-I70.0). 6. Minimal tree-in-bud reticulonodular opacities in the left lower lobe characteristic for atypical infectious  bronchiolitis. 7. Left groin hernia containing adipose tissue, probably a direct inguinal hernia. 8. Exaggerated lumbar lordosis with grade 1 degenerative anterolisthesis at L4-5.    11/26/2016 Imaging    Bone density- BMD as determined from Femur Neck Left is 0.820 g/cm2 with a T-Score of -1.6.  This patient is considered OSTEOPENIC according to [facility]) criteria.      CANCER STAGING: Cancer Staging Invasive ductal carcinoma of breast, female, right [facility]) Staging form: Breast, AJCC 8th Edition - Pathologic stage from 10/23/2016: Stage Unknown (pT1a, pNX, cM0, G1, ER: Positive, PR: Positive, HER2: Negative) - Signed by Baird Cancer, PA-C on 10/23/2016    INTERVAL HISTORY:  Amy [name] 83 y.o. female returns for routine follow-up for the left breast cancer.  She has been doing well since her last visit.  She has no complaints at this time. Denies any nausea, vomiting, or diarrhea. Denies any new pains. Had not noticed any recent bleeding such as epistaxis, hematuria or hematochezia. Denies recent chest pain on exertion, shortness of breath on minimal exertion, pre-syncopal episodes, or palpitations. Denies any numbness or tingling in hands or feet. Denies any recent fevers, infections, or recent hospitalizations. Patient reports appetite at 100 % and energy level at 25 %.  She is eating well and maintaining her weight at this time.     REVIEW OF SYSTEMS:  Review of Systems  All other systems reviewed and are negative.    PAST MEDICAL/SURGICAL HISTORY:  Past Medical History:  Diagnosis Date  . Anxiety   . Arthritis   . Back pain   . Colon polyps   . Depression   .  Diabetes mellitus, type II ([place])    diet controlled  . Hypercholesterolemia   . Invasive ductal carcinoma of breast, female, right ([place])   . Osteopenia 11/26/2016  . Skin-picking disorder    has 3 open wounds on right wrist that are being treated. About a quarter in size.   Past  Surgical History:  Procedure Laterality Date  . APPENDECTOMY    . BREAST BIOPSY Right 10/09/2016   Procedure: BREAST BIOPSY WITH NEEDLE LOCALIZATION;  Surgeon: [name], MD;  Location: [facility];  Service: General;  Laterality: Right;  . BREAST SURGERY Right   . BUNIONECTOMY Bilateral   . FACIAL COSMETIC SURGERY     drooping eyelid and brow  . HERNIA REPAIR Right    Inguinal x2  . KNEE ARTHROSCOPY Right   . SHOULDER SURGERY Right    rotator cuff repair and removal of bone spur  . TEMPOROMANDIBULAR JOINT SURGERY Right   . TOTAL KNEE ARTHROPLASTY Right 05/05/2018   Procedure: TOTAL KNEE ARTHROPLASTY;  Surgeon: [name], MD;  Location: [facility];  Service: Orthopedics;  Laterality: Right;     SOCIAL HISTORY:  Social History   Socioeconomic History  . Marital status: Divorced    Spouse name: Not on file  . Number of children: Not on file  . Years of education: Not on file  . Highest education level: Not on file  Occupational History  . 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 Financial resource strain: Not hard at all  . Food insecurity:    Worry: Never true    Inability: Never true  . Transportation needs:    Medical: No    Non-medical: No  Tobacco Use  . Smoking status: Former Smoker    Packs/day: 0.50    Years: 40.00    Pack years: 20.00    Types: Cigarettes    Last attempt to quit: 09/04/1995    Years since quitting: 23.2  . Smokeless tobacco: Never Used  Substance and Sexual Activity  . Alcohol use: No  . Drug use: No    Types: Benzodiazepines, Hydrocodone  . Sexual activity: Never  Lifestyle  . Physical activity:    Days per week: 0 days    Minutes per session: 0 min  . Stress: Only a little  Relationships  . Social connections:    Talks on phone: Once a week    Gets together: Never    Attends religious service: More than 4 times per year    Active member of club or organization: No    Attends meetings of clubs or organizations:  Never    Relationship status: Divorced  . Intimate partner violence:    Fear of current or ex partner: No    Emotionally abused: No    Physically abused: No    Forced sexual activity: No  Other Topics Concern  . Not on file  Social History Narrative  . Not on file    FAMILY HISTORY:  Family History  Problem Relation Age of Onset  . Anxiety disorder Mother   . Depression Mother   . Depression Maternal Grandmother   . Alcohol abuse Brother   . Cancer - Other Sister   . ADD / ADHD Neg Hx   . Bipolar disorder Neg Hx   . Dementia Neg Hx   . Drug abuse Neg Hx   . OCD Neg Hx   . Paranoid behavior Neg Hx   . Schizophrenia Neg Hx   .  Seizures Neg Hx   . Sexual abuse Neg Hx   . Physical abuse Neg Hx     CURRENT MEDICATIONS:  Outpatient Encounter Medications as of 12/08/2018  Medication Sig  . ALPRAZolam (XANAX) 0.5 MG tablet TAKE 1 TABLET(0.5 MG) BY MOUTH DAILY AS NEEDED FOR ANXIETY  . calcium carbonate (OSCAL) 1500 (600 Ca) MG TABS tablet Take 600 mg of elemental calcium by mouth daily.  . cholecalciferol (VITAMIN D) 1000 units tablet Take 1 tablet (1,000 Units total) by mouth daily.  Marland Kitchen escitalopram (LEXAPRO) 20 MG tablet TAKE 1 TABLET BY MOUTH DAILY  . gabapentin (NEURONTIN) 300 MG capsule Take 300 mg by mouth at bedtime.   Marland Kitchen ibuprofen (ADVIL,MOTRIN) 800 MG tablet Take 1 tablet (800 mg total) by mouth every 8 (eight) hours as needed.  Marland Kitchen letrozole (FEMARA) 2.5 MG tablet Take 1 tablet (2.5 mg total) by mouth daily.  . metFORMIN (GLUCOPHAGE) 500 MG tablet Take 500 mg by mouth 2 (two) times daily with a meal.   . ONE TOUCH ULTRA TEST test strip USE TO TEST BLOOD SUGAR ONCE DAILY  . risperiDONE (RISPERDAL) 1 MG tablet Take 1 tablet (1 mg total) by mouth at bedtime.  . traZODone (DESYREL) 50 MG tablet Take 1 tablet (50 mg total) by mouth at bedtime.  Marland Kitchen venlafaxine XR (EFFEXOR-XR) 150 MG 24 hr capsule TAKE 1 CAPSULE(150 MG) BY MOUTH TWICE DAILY  . aspirin EC 325 MG EC tablet Take 1  tablet (325 mg total) by mouth daily with breakfast. (Patient not taking: Reported on 12/08/2018)  . Glycerin-Hypromellose-PEG 400 (HM DRY EYE RELIEF) 0.2-0.2-1 % SOLN Apply 1 drop to eye daily as needed (dry eyes).   No facility-administered encounter medications on file as of 12/08/2018.     ALLERGIES:  Allergies  Allergen Reactions  . Cymbalta [Duloxetine Hcl] Other (See Comments)    Hallucinated and couldn't think; got worse and worse the longer she took this.     PHYSICAL EXAM:  ECOG Performance status: 1  Vitals:   12/08/18 1442  BP: (!) 144/70  Pulse: (!) 103  Resp: 16  Temp: (!) 97.4 F (36.3 C)  SpO2: 97%   Filed Weights   12/08/18 1442  Weight: 165 lb 3.2 oz (74.9 kg)    Physical Exam Constitutional:      Appearance: Normal appearance. She is normal weight.  Cardiovascular:     Rate and Rhythm: Normal rate and regular rhythm.     Heart sounds: Normal heart sounds.  Pulmonary:     Effort: Pulmonary effort is normal.     Breath sounds: Normal breath sounds.  Abdominal:     General: Bowel sounds are normal.     Palpations: Abdomen is soft.  Musculoskeletal: Normal range of motion.  Skin:    General: Skin is warm and dry.  Neurological:     Mental Status: She is alert and oriented to person, place, and time. Mental status is at baseline.  Psychiatric:        Mood and Affect: Mood normal.        Behavior: Behavior normal.        Thought Content: Thought content normal.        Judgment: Judgment normal.   Breast: No palpable masses, no skin changes or nipple discharge, no adenopathy.   LABORATORY DATA:  I have reviewed the labs as listed.  CBC    Component Value Date/Time   WBC 8.4 12/01/2018 1148   RBC 3.99 12/01/2018 1148  HGB 12.0 12/01/2018 1148   HCT 36.3 12/01/2018 1148   PLT 223 12/01/2018 1148   MCV 91.0 12/01/2018 1148   MCH 30.1 12/01/2018 1148   MCHC 33.1 12/01/2018 1148   RDW 11.9 12/01/2018 1148   LYMPHSABS 0.8 12/01/2018 1148    MONOABS 0.7 12/01/2018 1148   EOSABS 0.0 12/01/2018 1148   BASOSABS 0.0 12/01/2018 1148   CMP Latest Ref Rng & Units 12/01/2018 06/08/2018 05/19/2018  Glucose 70 - 99 mg/dL 203(H) 134(H) 117(H)  BUN 8 - 23 mg/dL _0 Creatinine 0.44 - 1.00 mg/dL 1.01(H) 0.84 0.80  Sodium 135 - 145 mmol/L 137 140 138  Potassium 3.5 - 5.1 mmol/L 3.8 4.5 4.5  Chloride 98 - 111 mmol/L 97(L) 103 102  CO2 22 - 32 mmol/L _1 Calcium 8.9 - 10.3 mg/dL 9.1 9.5 9.2  Total Protein 6.5 - 8.1 g/dL 6.8 6.7 5.7(L)  Total Bilirubin 0.3 - 1.2 mg/dL 0.9 0.5 0.7  Alkaline Phos 38 - 126 U/L 68 91 85  AST 15 - 41 U/L 14(L) 19 13(L)  ALT 0 - 44 U/L _2 DIAGNOSTIC IMAGING:  I have independently reviewed the mammogram and discussed with the patient.   I personally performed a face-to-face visit.  All questions were answered to patient's stated satisfaction. Encouraged patient to call with any new concerns or questions before his next visit to the cancer center and we can certain see him sooner, if needed.     ASSESSMENT & PLAN:   Invasive ductal carcinoma of breast, female, right ([place]) 1.  Stage Ia right breast cancer: - Status post lumpectomy with Dr. Arnoldo Morale on 10/09/2016. - Invasive ductal carcinoma of the right breast, ER/PR positive, HER-2 negative. -Received no radiation. -Started on anastrozole in March 2018.  She did not take anastrozole from 05/17/2017 through 10/15/2017 likely due from intolerance. -She is now taking letrozole 2.5 mg without any major problems.  She denies any hot flashes or musculoskeletal problems. - Physical examination today did not reveal any palpable masses.  Right lumpectomy site in the medial quadrant is within normal limits. - Her last mammogram was on 12/01/2018 which was BI-RADS Category 3 probably benign. -We will repeat a diagnostic mammogram in 6 months. -We will follow-up with her after the mammogram with labs. -  2.  Osteopenia: - Her last DEXA scan was on  11/26/2017 showed a T score of -1.7. -She was started on Prolia every 6 months on 12/19/2016. -Her last Prolia injection was on 06/09/2018. - Labs on 12/01/2018 showed her calcium level 9.1 - She will proceed with her Prolia injection today      Orders placed this encounter:  Orders Placed This Encounter  Procedures  . MM DIAG BREAST TOMO UNI LEFT  . CBC with Differential/Platelet  . Comprehensive metabolic panel  . VITAMIN D 25 Hydroxy (Vit-D Deficiency, Fractures)  . Vitamin B12     Francene Finders, FNP-C [place] [phone]

## 2018-12-08 NOTE — Assessment & Plan Note (Addendum)
1.  Stage Ia right breast cancer: - Status post lumpectomy with Dr. Arnoldo Morale on 10/09/2016. - Invasive ductal carcinoma of the right breast, ER/PR positive, HER-2 negative. -Received no radiation. -Started on anastrozole in March 2018.  She did not take anastrozole from 05/17/2017 through 10/15/2017 likely due from intolerance. -She is now taking letrozole 2.5 mg without any major problems.  She denies any hot flashes or musculoskeletal problems. - Physical examination today did not reveal any palpable masses.  Right lumpectomy site in the medial quadrant is within normal limits. - Her last mammogram was on 12/01/2018 which was BI-RADS Category 3 probably benign. -We will repeat a diagnostic mammogram in 6 months. -We will follow-up with her after the mammogram with labs. -  2.  Osteopenia: - Her last DEXA scan was on 11/26/2017 showed a T score of -1.7. -She was started on Prolia every 6 months on 12/19/2016. -Her last Prolia injection was on 06/09/2018. - Labs on 12/01/2018 showed her calcium level 9.1 - She will proceed with her Prolia injection today

## 2018-12-17 DIAGNOSIS — M4310 Spondylolisthesis, site unspecified: Secondary | ICD-10-CM | POA: Diagnosis not present

## 2019-01-04 ENCOUNTER — Other Ambulatory Visit (HOSPITAL_COMMUNITY): Payer: Self-pay | Admitting: Psychiatry

## 2019-01-04 NOTE — Telephone Encounter (Signed)
Appt 01-05-2019

## 2019-01-04 NOTE — Telephone Encounter (Signed)
Please schedule appointment.

## 2019-01-05 ENCOUNTER — Encounter (HOSPITAL_COMMUNITY): Payer: Self-pay | Admitting: Psychiatry

## 2019-01-05 ENCOUNTER — Other Ambulatory Visit: Payer: Self-pay

## 2019-01-05 ENCOUNTER — Ambulatory Visit (INDEPENDENT_AMBULATORY_CARE_PROVIDER_SITE_OTHER): Payer: Medicare HMO | Admitting: Psychiatry

## 2019-01-05 DIAGNOSIS — R69 Illness, unspecified: Secondary | ICD-10-CM | POA: Diagnosis not present

## 2019-01-05 DIAGNOSIS — F330 Major depressive disorder, recurrent, mild: Secondary | ICD-10-CM | POA: Diagnosis not present

## 2019-01-05 MED ORDER — ESCITALOPRAM OXALATE 20 MG PO TABS: 20.0000 mg | ORAL_TABLET | Freq: Every day | ORAL | 2 refills | Status: DC

## 2019-01-05 MED ORDER — TRAZODONE HCL 50 MG PO TABS: ORAL_TABLET | ORAL | 2 refills | Status: DC

## 2019-01-05 MED ORDER — RISPERIDONE 1 MG PO TABS: 1.0000 mg | ORAL_TABLET | Freq: Every day | ORAL | 2 refills | Status: DC

## 2019-01-05 MED ORDER — VENLAFAXINE HCL ER 150 MG PO CP24: ORAL_CAPSULE | ORAL | 2 refills | Status: DC

## 2019-01-05 MED ORDER — ALPRAZOLAM 0.5 MG PO TABS: ORAL_TABLET | ORAL | 2 refills | Status: DC
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
## 2019-01-10 DIAGNOSIS — R69 Illness, unspecified: Secondary | ICD-10-CM | POA: Diagnosis not present

## 2019-01-11 DIAGNOSIS — R69 Illness, unspecified: Secondary | ICD-10-CM | POA: Diagnosis not present

## 2019-01-28 ENCOUNTER — Other Ambulatory Visit (HOSPITAL_COMMUNITY): Payer: Self-pay | Admitting: Hematology

## 2019-01-28 DIAGNOSIS — C50911 Malignant neoplasm of unspecified site of right female breast: Secondary | ICD-10-CM

## 2019-02-18 DIAGNOSIS — M4310 Spondylolisthesis, site unspecified: Secondary | ICD-10-CM | POA: Diagnosis not present

## 2019-02-18 DIAGNOSIS — I10 Essential (primary) hypertension: Secondary | ICD-10-CM | POA: Diagnosis not present

## 2019-02-18 DIAGNOSIS — Z6828 Body mass index (BMI) 28.0-28.9, adult: Secondary | ICD-10-CM | POA: Diagnosis not present

## 2019-02-23 ENCOUNTER — Other Ambulatory Visit: Payer: Self-pay | Admitting: Neurosurgery

## 2019-02-23 DIAGNOSIS — M4314 Spondylolisthesis, thoracic region: Secondary | ICD-10-CM

## 2019-02-23 DIAGNOSIS — M4316 Spondylolisthesis, lumbar region: Secondary | ICD-10-CM

## 2019-02-26 DIAGNOSIS — R69 Illness, unspecified: Secondary | ICD-10-CM | POA: Diagnosis not present

## 2019-03-05 ENCOUNTER — Ambulatory Visit
Admission: RE | Admit: 2019-03-05 | Discharge: 2019-03-05 | Disposition: A | Discharge status: Home / Self Care | Payer: Medicare HMO | Source: Ambulatory Visit | Attending: Neurosurgery | Admitting: Neurosurgery

## 2019-03-05 DIAGNOSIS — M4316 Spondylolisthesis, lumbar region: Secondary | ICD-10-CM

## 2019-03-05 DIAGNOSIS — M4325 Fusion of spine, thoracolumbar region: Secondary | ICD-10-CM | POA: Diagnosis not present

## 2019-03-05 DIAGNOSIS — M4314 Spondylolisthesis, thoracic region: Secondary | ICD-10-CM

## 2019-03-10 ENCOUNTER — Other Ambulatory Visit: Payer: Self-pay | Admitting: Neurosurgery

## 2019-03-10 DIAGNOSIS — R030 Elevated blood-pressure reading, without diagnosis of hypertension: Secondary | ICD-10-CM | POA: Diagnosis not present

## 2019-03-10 DIAGNOSIS — S3210XA Unspecified fracture of sacrum, initial encounter for closed fracture: Secondary | ICD-10-CM | POA: Diagnosis not present

## 2019-03-10 DIAGNOSIS — S129XXA Fracture of neck, unspecified, initial encounter: Secondary | ICD-10-CM | POA: Insufficient documentation

## 2019-03-10 DIAGNOSIS — S32009K Unspecified fracture of unspecified lumbar vertebra, subsequent encounter for fracture with nonunion: Secondary | ICD-10-CM | POA: Diagnosis not present

## 2019-03-10 DIAGNOSIS — Z6829 Body mass index (BMI) 29.0-29.9, adult: Secondary | ICD-10-CM | POA: Diagnosis not present

## 2019-03-18 ENCOUNTER — Other Ambulatory Visit: Payer: Self-pay | Admitting: Neurosurgery

## 2019-03-18 ENCOUNTER — Other Ambulatory Visit (HOSPITAL_COMMUNITY): Payer: Self-pay | Admitting: Neurosurgery

## 2019-03-18 DIAGNOSIS — S3210XA Unspecified fracture of sacrum, initial encounter for closed fracture: Secondary | ICD-10-CM

## 2019-03-20 ENCOUNTER — Emergency Department (HOSPITAL_COMMUNITY): Payer: Medicare HMO

## 2019-03-20 ENCOUNTER — Inpatient Hospital Stay (HOSPITAL_COMMUNITY)
Admission: EM | Admit: 2019-03-20 | Discharge: 2019-03-26 | DRG: 065 | Disposition: A | Discharge status: Skilled Nursing Facility | Payer: Medicare HMO | Attending: Internal Medicine | Admitting: Internal Medicine

## 2019-03-20 ENCOUNTER — Encounter (HOSPITAL_COMMUNITY): Payer: Self-pay | Admitting: Emergency Medicine

## 2019-03-20 ENCOUNTER — Other Ambulatory Visit: Payer: Self-pay

## 2019-03-20 DIAGNOSIS — Z79899 Other long term (current) drug therapy: Secondary | ICD-10-CM

## 2019-03-20 DIAGNOSIS — Z7984 Long term (current) use of oral hypoglycemic drugs: Secondary | ICD-10-CM

## 2019-03-20 DIAGNOSIS — M2550 Pain in unspecified joint: Secondary | ICD-10-CM | POA: Diagnosis not present

## 2019-03-20 DIAGNOSIS — E785 Hyperlipidemia, unspecified: Secondary | ICD-10-CM | POA: Diagnosis not present

## 2019-03-20 DIAGNOSIS — I63413 Cerebral infarction due to embolism of bilateral middle cerebral arteries: Principal | ICD-10-CM | POA: Diagnosis present

## 2019-03-20 DIAGNOSIS — I255 Ischemic cardiomyopathy: Secondary | ICD-10-CM | POA: Diagnosis not present

## 2019-03-20 DIAGNOSIS — Z7982 Long term (current) use of aspirin: Secondary | ICD-10-CM | POA: Diagnosis not present

## 2019-03-20 DIAGNOSIS — I872 Venous insufficiency (chronic) (peripheral): Secondary | ICD-10-CM | POA: Diagnosis present

## 2019-03-20 DIAGNOSIS — M1990 Unspecified osteoarthritis, unspecified site: Secondary | ICD-10-CM | POA: Diagnosis not present

## 2019-03-20 DIAGNOSIS — F330 Major depressive disorder, recurrent, mild: Secondary | ICD-10-CM

## 2019-03-20 DIAGNOSIS — R471 Dysarthria and anarthria: Secondary | ICD-10-CM | POA: Diagnosis present

## 2019-03-20 DIAGNOSIS — F5105 Insomnia due to other mental disorder: Secondary | ICD-10-CM | POA: Diagnosis present

## 2019-03-20 DIAGNOSIS — R29702 NIHSS score 2: Secondary | ICD-10-CM | POA: Diagnosis present

## 2019-03-20 DIAGNOSIS — Z79891 Long term (current) use of opiate analgesic: Secondary | ICD-10-CM

## 2019-03-20 DIAGNOSIS — Z96651 Presence of right artificial knee joint: Secondary | ICD-10-CM | POA: Diagnosis present

## 2019-03-20 DIAGNOSIS — Z888 Allergy status to other drugs, medicaments and biological substances status: Secondary | ICD-10-CM

## 2019-03-20 DIAGNOSIS — I6389 Other cerebral infarction: Secondary | ICD-10-CM | POA: Diagnosis not present

## 2019-03-20 DIAGNOSIS — Z170 Estrogen receptor positive status [ER+]: Secondary | ICD-10-CM | POA: Diagnosis not present

## 2019-03-20 DIAGNOSIS — I313 Pericardial effusion (noninflammatory): Secondary | ICD-10-CM | POA: Diagnosis present

## 2019-03-20 DIAGNOSIS — I428 Other cardiomyopathies: Secondary | ICD-10-CM | POA: Diagnosis not present

## 2019-03-20 DIAGNOSIS — Z20828 Contact with and (suspected) exposure to other viral communicable diseases: Secondary | ICD-10-CM | POA: Diagnosis present

## 2019-03-20 DIAGNOSIS — E1149 Type 2 diabetes mellitus with other diabetic neurological complication: Secondary | ICD-10-CM | POA: Diagnosis present

## 2019-03-20 DIAGNOSIS — C50911 Malignant neoplasm of unspecified site of right female breast: Secondary | ICD-10-CM | POA: Diagnosis present

## 2019-03-20 DIAGNOSIS — M810 Age-related osteoporosis without current pathological fracture: Secondary | ICD-10-CM | POA: Diagnosis present

## 2019-03-20 DIAGNOSIS — F418 Other specified anxiety disorders: Secondary | ICD-10-CM | POA: Diagnosis present

## 2019-03-20 DIAGNOSIS — I959 Hypotension, unspecified: Secondary | ICD-10-CM | POA: Diagnosis not present

## 2019-03-20 DIAGNOSIS — Z7401 Bed confinement status: Secondary | ICD-10-CM | POA: Diagnosis not present

## 2019-03-20 DIAGNOSIS — I5020 Unspecified systolic (congestive) heart failure: Secondary | ICD-10-CM | POA: Diagnosis not present

## 2019-03-20 DIAGNOSIS — I5022 Chronic systolic (congestive) heart failure: Secondary | ICD-10-CM | POA: Diagnosis not present

## 2019-03-20 DIAGNOSIS — I340 Nonrheumatic mitral (valve) insufficiency: Secondary | ICD-10-CM | POA: Diagnosis not present

## 2019-03-20 DIAGNOSIS — I700 Atherosclerosis of aorta: Secondary | ICD-10-CM | POA: Diagnosis present

## 2019-03-20 DIAGNOSIS — R410 Disorientation, unspecified: Secondary | ICD-10-CM | POA: Diagnosis not present

## 2019-03-20 DIAGNOSIS — I5021 Acute systolic (congestive) heart failure: Secondary | ICD-10-CM | POA: Diagnosis not present

## 2019-03-20 DIAGNOSIS — G8191 Hemiplegia, unspecified affecting right dominant side: Secondary | ICD-10-CM | POA: Diagnosis not present

## 2019-03-20 DIAGNOSIS — I10 Essential (primary) hypertension: Secondary | ICD-10-CM | POA: Diagnosis not present

## 2019-03-20 DIAGNOSIS — Z818 Family history of other mental and behavioral disorders: Secondary | ICD-10-CM

## 2019-03-20 DIAGNOSIS — I63512 Cerebral infarction due to unspecified occlusion or stenosis of left middle cerebral artery: Secondary | ICD-10-CM | POA: Diagnosis present

## 2019-03-20 DIAGNOSIS — Q211 Atrial septal defect: Secondary | ICD-10-CM

## 2019-03-20 DIAGNOSIS — I69898 Other sequelae of other cerebrovascular disease: Secondary | ICD-10-CM | POA: Diagnosis not present

## 2019-03-20 DIAGNOSIS — R29818 Other symptoms and signs involving the nervous system: Secondary | ICD-10-CM | POA: Diagnosis not present

## 2019-03-20 DIAGNOSIS — G459 Transient cerebral ischemic attack, unspecified: Secondary | ICD-10-CM | POA: Diagnosis not present

## 2019-03-20 DIAGNOSIS — R69 Illness, unspecified: Secondary | ICD-10-CM | POA: Diagnosis not present

## 2019-03-20 DIAGNOSIS — M6281 Muscle weakness (generalized): Secondary | ICD-10-CM | POA: Diagnosis not present

## 2019-03-20 DIAGNOSIS — I499 Cardiac arrhythmia, unspecified: Secondary | ICD-10-CM | POA: Diagnosis not present

## 2019-03-20 DIAGNOSIS — I352 Nonrheumatic aortic (valve) stenosis with insufficiency: Secondary | ICD-10-CM | POA: Diagnosis present

## 2019-03-20 DIAGNOSIS — R269 Unspecified abnormalities of gait and mobility: Secondary | ICD-10-CM | POA: Diagnosis not present

## 2019-03-20 DIAGNOSIS — I639 Cerebral infarction, unspecified: Secondary | ICD-10-CM

## 2019-03-20 DIAGNOSIS — R29810 Facial weakness: Secondary | ICD-10-CM | POA: Diagnosis present

## 2019-03-20 DIAGNOSIS — Z8673 Personal history of transient ischemic attack (TIA), and cerebral infarction without residual deficits: Secondary | ICD-10-CM | POA: Diagnosis present

## 2019-03-20 DIAGNOSIS — I361 Nonrheumatic tricuspid (valve) insufficiency: Secondary | ICD-10-CM | POA: Diagnosis not present

## 2019-03-20 DIAGNOSIS — Z87891 Personal history of nicotine dependence: Secondary | ICD-10-CM

## 2019-03-20 DIAGNOSIS — I110 Hypertensive heart disease with heart failure: Secondary | ICD-10-CM | POA: Diagnosis present

## 2019-03-20 LAB — SARS CORONAVIRUS 2 BY RT PCR (HOSPITAL ORDER, PERFORMED IN ~~LOC~~ HOSPITAL LAB): SARS Coronavirus 2: NEGATIVE

## 2019-03-20 LAB — URINALYSIS, ROUTINE W REFLEX MICROSCOPIC
Bilirubin Urine: NEGATIVE
Glucose, UA: NEGATIVE mg/dL
Hgb urine dipstick: NEGATIVE
Ketones, ur: NEGATIVE mg/dL
Nitrite: POSITIVE — AB
Protein, ur: NEGATIVE mg/dL
Specific Gravity, Urine: 1.011 (ref 1.005–1.030)
pH: 7 (ref 5.0–8.0)

## 2019-03-20 LAB — COMPREHENSIVE METABOLIC PANEL
ALT: 12 U/L (ref 0–44)
AST: 19 U/L (ref 15–41)
Albumin: 3.9 g/dL (ref 3.5–5.0)
Alkaline Phosphatase: 66 U/L (ref 38–126)
Anion gap: 10 (ref 5–15)
BUN: 19 mg/dL (ref 8–23)
CO2: 25 mmol/L (ref 22–32)
Calcium: 8.9 mg/dL (ref 8.9–10.3)
Chloride: 101 mmol/L (ref 98–111)
Creatinine, Ser: 0.72 mg/dL (ref 0.44–1.00)
GFR calc Af Amer: >60 mL/min (ref >60)
GFR calc non Af Amer: >60 mL/min (ref >60)
Glucose, Bld: 115 mg/dL — ABNORMAL HIGH (ref 70–99)
Potassium: 4.2 mmol/L (ref 3.5–5.1)
Sodium: 136 mmol/L (ref 135–145)
Total Bilirubin: 0.6 mg/dL (ref 0.3–1.2)
Total Protein: 6.9 g/dL (ref 6.5–8.1)

## 2019-03-20 LAB — I-STAT CHEM 8, ED
BUN: 19 mg/dL (ref 8–23)
Calcium, Ion: 1.02 mmol/L — ABNORMAL LOW (ref 1.15–1.40)
Chloride: 102 mmol/L (ref 98–111)
Creatinine, Ser: 0.8 mg/dL (ref 0.44–1.00)
Glucose, Bld: 113 mg/dL — ABNORMAL HIGH (ref 70–99)
HCT: 42 % (ref 36.0–46.0)
Hemoglobin: 14.3 g/dL (ref 12.0–15.0)
Potassium: 4.2 mmol/L (ref 3.5–5.1)
Sodium: 135 mmol/L (ref 135–145)
TCO2: 26 mmol/L (ref 22–32)

## 2019-03-20 LAB — DIFFERENTIAL
Abs Immature Granulocytes: 0.04 10*3/uL (ref 0.00–0.07)
Basophils Absolute: 0 10*3/uL (ref 0.0–0.1)
Basophils Relative: 0 %
Eosinophils Absolute: 0.2 10*3/uL (ref 0.0–0.5)
Eosinophils Relative: 1 %
Immature Granulocytes: 0 %
Lymphocytes Relative: 11 %
Lymphs Abs: 1.1 10*3/uL (ref 0.7–4.0)
Monocytes Absolute: 0.6 10*3/uL (ref 0.1–1.0)
Monocytes Relative: 6 %
Neutro Abs: 8.6 10*3/uL — ABNORMAL HIGH (ref 1.7–7.7)
Neutrophils Relative %: 82 %

## 2019-03-20 LAB — CBC
HCT: 43.9 % (ref 36.0–46.0)
Hemoglobin: 14.3 g/dL (ref 12.0–15.0)
MCH: 29.9 pg (ref 26.0–34.0)
MCHC: 32.6 g/dL (ref 30.0–36.0)
MCV: 91.6 fL (ref 80.0–100.0)
Platelets: 226 10*3/uL (ref 150–400)
RBC: 4.79 MIL/uL (ref 3.87–5.11)
RDW: 12.9 % (ref 11.5–15.5)
WBC: 10.6 10*3/uL — ABNORMAL HIGH (ref 4.0–10.5)
nRBC: 0 % (ref 0.0–0.2)

## 2019-03-20 LAB — PROTIME-INR
INR: 1 (ref 0.8–1.2)
Prothrombin Time: 12.9 seconds (ref 11.4–15.2)

## 2019-03-20 LAB — RAPID URINE DRUG SCREEN, HOSP PERFORMED
Amphetamines: NOT DETECTED
Barbiturates: NOT DETECTED
Benzodiazepines: NOT DETECTED
Cocaine: NOT DETECTED
Opiates: NOT DETECTED
Tetrahydrocannabinol: NOT DETECTED

## 2019-03-20 LAB — GLUCOSE, CAPILLARY: Glucose-Capillary: 114 mg/dL — ABNORMAL HIGH (ref 70–99)

## 2019-03-20 LAB — CBG MONITORING, ED
Glucose-Capillary: 113 mg/dL — ABNORMAL HIGH (ref 70–99)
Glucose-Capillary: 111 mg/dL — ABNORMAL HIGH (ref 70–99)

## 2019-03-20 LAB — APTT: aPTT: 26 seconds (ref 24–36)

## 2019-03-20 LAB — ETHANOL: Alcohol, Ethyl (B): <10 mg/dL (ref <10)

## 2019-03-20 MED ORDER — ACETAMINOPHEN 325 MG PO TABS: 650.0000 mg | ORAL_TABLET | ORAL | Status: DC | PRN

## 2019-03-20 MED ORDER — ASPIRIN EC 325 MG PO TBEC
325.0000 mg | DELAYED_RELEASE_TABLET | Freq: Every day | ORAL | Status: DC
  Administered 2019-03-21: 325 mg via ORAL
  Filled 2019-03-20: qty 1

## 2019-03-20 MED ORDER — POLYVINYL ALCOHOL 1.4 % OP SOLN
1.0000 [drp] | Freq: Every day | OPHTHALMIC | Status: DC | PRN
  Filled 2019-03-20: qty 15

## 2019-03-20 MED ORDER — INSULIN ASPART 100 UNIT/ML ~~LOC~~ SOLN
0.0000 [IU] | Freq: Three times a day (TID) | SUBCUTANEOUS | Status: DC
  Administered 2019-03-21: 8 [IU] via SUBCUTANEOUS
  Administered 2019-03-21: 2 [IU] via SUBCUTANEOUS
  Administered 2019-03-22: 3 [IU] via SUBCUTANEOUS
  Administered 2019-03-23: 2 [IU] via SUBCUTANEOUS
  Administered 2019-03-23: 3 [IU] via SUBCUTANEOUS
  Administered 2019-03-24: 5 [IU] via SUBCUTANEOUS
  Administered 2019-03-24 – 2019-03-25 (×3): 2 [IU] via SUBCUTANEOUS

## 2019-03-20 MED ORDER — INSULIN ASPART 100 UNIT/ML ~~LOC~~ SOLN: 0.0000 [IU] | Freq: Every day | SUBCUTANEOUS | Status: DC

## 2019-03-20 MED ORDER — INSULIN GLARGINE 100 UNIT/ML ~~LOC~~ SOLN
15.0000 [IU] | Freq: Every day | SUBCUTANEOUS | Status: DC
  Administered 2019-03-20 – 2019-03-24 (×4): 15 [IU] via SUBCUTANEOUS
  Filled 2019-03-20 (×7): qty 0.15

## 2019-03-20 MED ORDER — VENLAFAXINE HCL ER 75 MG PO CP24
150.0000 mg | ORAL_CAPSULE | Freq: Every day | ORAL | Status: DC
  Administered 2019-03-21 – 2019-03-26 (×6): 150 mg via ORAL
  Filled 2019-03-20 (×6): qty 2

## 2019-03-20 MED ORDER — STROKE: EARLY STAGES OF RECOVERY BOOK
Freq: Once | Status: AC
  Administered 2019-03-20: 23:00:00
  Filled 2019-03-20: qty 1

## 2019-03-20 MED ORDER — ACETAMINOPHEN 160 MG/5ML PO SOLN: 650.0000 mg | ORAL | Status: DC | PRN

## 2019-03-20 MED ORDER — ALPRAZOLAM 0.5 MG PO TABS
0.5000 mg | ORAL_TABLET | Freq: Two times a day (BID) | ORAL | Status: DC | PRN
  Administered 2019-03-21: 0.5 mg via ORAL
  Filled 2019-03-20: qty 1

## 2019-03-20 MED ORDER — ESCITALOPRAM OXALATE 10 MG PO TABS
20.0000 mg | ORAL_TABLET | Freq: Every day | ORAL | Status: DC
  Administered 2019-03-21 – 2019-03-26 (×6): 20 mg via ORAL
  Filled 2019-03-20 (×7): qty 2

## 2019-03-20 MED ORDER — SENNOSIDES-DOCUSATE SODIUM 8.6-50 MG PO TABS: 1.0000 | ORAL_TABLET | Freq: Every evening | ORAL | Status: DC | PRN

## 2019-03-20 MED ORDER — LETROZOLE 2.5 MG PO TABS
2.5000 mg | ORAL_TABLET | Freq: Every day | ORAL | Status: DC
  Administered 2019-03-21 – 2019-03-26 (×5): 2.5 mg via ORAL
  Filled 2019-03-20 (×6): qty 1

## 2019-03-20 MED ORDER — ACETAMINOPHEN 650 MG RE SUPP: 650.0000 mg | RECTAL | Status: DC | PRN

## 2019-03-20 MED ORDER — TRAZODONE HCL 50 MG PO TABS
50.0000 mg | ORAL_TABLET | Freq: Every day | ORAL | Status: DC
  Administered 2019-03-20 – 2019-03-25 (×6): 50 mg via ORAL
  Filled 2019-03-20 (×6): qty 1

## 2019-03-20 MED ORDER — ASPIRIN 325 MG PO TABS
325.0000 mg | ORAL_TABLET | Freq: Once | ORAL | Status: AC
  Administered 2019-03-20: 325 mg via ORAL
  Filled 2019-03-20: qty 1

## 2019-03-20 MED ORDER — GABAPENTIN 300 MG PO CAPS
300.0000 mg | ORAL_CAPSULE | Freq: Every day | ORAL | Status: DC
  Administered 2019-03-20 – 2019-03-25 (×6): 300 mg via ORAL
  Filled 2019-03-20 (×6): qty 1

## 2019-03-20 MED ORDER — VENLAFAXINE HCL ER 75 MG PO CP24: 150.0000 mg | ORAL_CAPSULE | Freq: Two times a day (BID) | ORAL | Status: DC

## 2019-03-20 MED ORDER — RISPERIDONE 0.5 MG PO TABS
1.0000 mg | ORAL_TABLET | Freq: Every day | ORAL | Status: DC
  Administered 2019-03-20 – 2019-03-25 (×6): 1 mg via ORAL
  Filled 2019-03-20 (×6): qty 2

## 2019-03-20 NOTE — ED Provider Notes (Signed)
[facility] EMERGENCY DEPARTMENT Provider Note   CSN: [id number] Arrival date & time: 03/20/19  1342     History   Chief Complaint Chief Complaint  Patient presents with  . Weakness    HPI Amy [name] is a 83 y.o. female.     HPI   Amy [name] is a 83 y.o. female who lives alone.  presents to the Emergency Department with complaints of right sided weakness.  She states that she woke this morning between 7-8 AM and noticed some difficulty with dressing herself.  She attempted to take her medication, but she is unclear if she actually took them.  She recalls waking up on the floor in her hallway.  Her son states that she did not answer her telephone this morning at 10:30 which was unusual for her. She denies headache, dizziness, back or neck pain.  Per patient's son speech is slow, but that is her baseline.      Past Medical History:  Diagnosis Date  . Anxiety   . Arthritis   . Back pain   . Colon polyps   . Depression   . Diabetes mellitus, type II ([place])    diet controlled  . Hypercholesterolemia   . Invasive ductal carcinoma of breast, female, right ([place])   . Osteopenia 11/26/2016  . Skin-picking disorder    has 3 open wounds on right wrist that are being treated. About a quarter in size.    Patient Active Problem List   Diagnosis Date Noted  . Edema of both lower legs due to peripheral venous insufficiency 10/12/2018  . Anemia 05/12/2018  . S/P total knee replacement, right 05/05/18 05/12/2018  . Type 2 diabetes mellitus with neurological complications ([place]) [id number]  . Primary osteoarthritis of right knee   . Status post surgery 01/26/2018  . Surgery, elective 01/26/2018  . Degenerative spondylolisthesis 01/26/2018  . Osteoporosis 12/13/2016  . Osteopenia 11/26/2016  . Invasive ductal carcinoma of breast, female, right ([place])   . Lumbago 03/22/2014  . Stiffness of joint, not elsewhere classified, pelvic region and thigh 03/22/2014  . Muscle  weakness (generalized) 03/22/2014  . Abnormality of gait 03/22/2014  . Depression with anxiety 09/03/2012  . Insomnia secondary to depression with anxiety 09/03/2012  . Pain 09/03/2012    Past Surgical History:  Procedure Laterality Date  . APPENDECTOMY    . BREAST BIOPSY Right 10/09/2016   Procedure: BREAST BIOPSY WITH NEEDLE LOCALIZATION;  Surgeon: [name], MD;  Location: [facility];  Service: General;  Laterality: Right;  . BREAST SURGERY Right   . BUNIONECTOMY Bilateral   . FACIAL COSMETIC SURGERY     drooping eyelid and brow  . HERNIA REPAIR Right    Inguinal x2  . KNEE ARTHROSCOPY Right   . SHOULDER SURGERY Right    rotator cuff repair and removal of bone spur  . TEMPOROMANDIBULAR JOINT SURGERY Right   . TOTAL KNEE ARTHROPLASTY Right 05/05/2018   Procedure: TOTAL KNEE ARTHROPLASTY;  Surgeon: [name], MD;  Location: [facility];  Service: Orthopedics;  Laterality: Right;     OB History   No obstetric history on file.      Home Medications    Prior to Admission medications   Medication Sig Start Date End Date Taking? Authorizing Provider  ALPRAZolam Duanne Moron) 0.5 MG tablet TAKE 1 TABLET(0.5 MG) BY MOUTH DAILY AS NEEDED FOR ANXIETY 01/05/19   Cloria Spring, MD  aspirin EC 325 MG EC tablet Take 1 tablet (325 mg  total) by mouth daily with breakfast. Patient not taking: Reported on 12/08/2018 05/08/18   [name], MD  calcium carbonate (OSCAL) 1500 (600 Ca) MG TABS tablet Take 600 mg of elemental calcium by mouth daily.    [provider]  cholecalciferol (VITAMIN D) 1000 units tablet Take 1 tablet (1,000 Units total) by mouth daily. 10/23/16   Baird Cancer, PA-C  escitalopram (LEXAPRO) 20 MG tablet Take 1 tablet (20 mg total) by mouth daily. 01/05/19   Cloria Spring, MD  gabapentin (NEURONTIN) 300 MG capsule Take 300 mg by mouth at bedtime.  03/27/18   [provider]  Glycerin-Hypromellose-PEG 400 (HM DRY EYE RELIEF) 0.2-0.2-1 % SOLN  Apply 1 drop to eye daily as needed (dry eyes).    [provider]  ibuprofen (ADVIL,MOTRIN) 800 MG tablet Take 1 tablet (800 mg total) by mouth every 8 (eight) hours as needed. 07/01/18   [name], MD  letrozole [facility]) 2.5 MG tablet TAKE 1 TABLET(2.5 MG) BY MOUTH DAILY 01/28/19   Derek Jack, MD  metFORMIN (GLUCOPHAGE) 500 MG tablet Take 500 mg by mouth 2 (two) times daily with a meal.  06/30/16   [provider]  ONE TOUCH ULTRA TEST test strip USE TO TEST BLOOD SUGAR ONCE DAILY 12/02/18   [provider]  risperiDONE (RISPERDAL) 1 MG tablet Take 1 tablet (1 mg total) by mouth at bedtime. 01/05/19 01/05/20  Cloria Spring, MD  traZODone (DESYREL) 50 MG tablet TAKE 1 TABLET(50 MG) BY MOUTH AT BEDTIME 01/05/19   Cloria Spring, MD  venlafaxine XR (EFFEXOR-XR) 150 MG 24 hr capsule TAKE 1 CAPSULE(150 MG) BY MOUTH TWICE DAILY 01/05/19   Cloria Spring, MD    Family History Family History  Problem Relation Age of Onset  . Anxiety disorder Mother   . Depression Mother   . Depression Maternal Grandmother   . Alcohol abuse Brother   . Cancer - Other Sister   . ADD / ADHD Neg Hx   . Bipolar disorder Neg Hx   . Dementia Neg Hx   . Drug abuse Neg Hx   . OCD Neg Hx   . Paranoid behavior Neg Hx   . Schizophrenia Neg Hx   . Seizures Neg Hx   . Sexual abuse Neg Hx   . Physical abuse Neg Hx     Social History Social History   Tobacco Use  . Smoking status: Former Smoker    Packs/day: 0.50    Years: 40.00    Pack years: 20.00    Types: Cigarettes    Quit date: 09/04/1995    Years since quitting: 23.5  . Smokeless tobacco: Never Used  Substance Use Topics  . Alcohol use: No  . Drug use: No    Types: Benzodiazepines, Hydrocodone     Allergies   Cymbalta [duloxetine hcl]   Review of Systems Review of Systems  Constitutional: Negative for appetite change, chills and fever.  HENT: Negative for trouble swallowing and voice change.   Eyes:  Negative for visual disturbance.  Respiratory: Negative for chest tightness and shortness of breath.   Cardiovascular: Negative for chest pain and leg swelling.  Gastrointestinal: Negative for abdominal pain, nausea and vomiting.  Genitourinary: Negative for difficulty urinating and dysuria.  Musculoskeletal: Negative for arthralgias, back pain and neck pain.  Skin: Negative for color change and wound.  Neurological: Positive for weakness. Negative for dizziness, facial asymmetry, speech difficulty, light-headedness and numbness.  Psychiatric/Behavioral: Negative for  confusion.     Physical Exam Updated Vital Signs Ht 5\' 6"  (1.676 m)   Wt 109.7 kg   BMI 39.03 kg/m   Physical Exam Vitals signs and nursing note reviewed.  Constitutional:      Appearance: Normal appearance.  HENT:     Head: Atraumatic.     Mouth/Throat:     Mouth: Mucous membranes are moist.     Pharynx: Oropharynx is clear.  Eyes:     General: No visual field deficit.    Extraocular Movements: Extraocular movements intact.     Conjunctiva/sclera: Conjunctivae normal.     Pupils: Pupils are equal, round, and reactive to light.  Neck:     Musculoskeletal: Normal range of motion.  Cardiovascular:     Rate and Rhythm: Normal rate and regular rhythm.     Pulses: Normal pulses.  Pulmonary:     Effort: Pulmonary effort is normal. No respiratory distress.     Breath sounds: Normal breath sounds.  Chest:     Chest wall: No tenderness.  Abdominal:     General: There is no distension.     Palpations: Abdomen is soft.     Tenderness: There is no abdominal tenderness. There is no guarding.  Musculoskeletal:        General: No tenderness, deformity or signs of injury.  Skin:    General: Skin is warm.     Capillary Refill: Capillary refill takes less than 2 seconds.     Findings: No erythema or rash.  Neurological:     Mental Status: She is alert and oriented to person, place, and time.     GCS: GCS eye subscore  is 4. GCS verbal subscore is 5. GCS motor subscore is 6.     Cranial Nerves: No facial asymmetry.     Sensory: Sensation is intact.     Motor: Weakness present.     Comments: Speech is slow, but appropriate and clear.  Alert X 3.  Marked weakness of the right upper extremity.  Pt unable to raise her arm.  Grip strength diminished on the right.  No facial droop.  Mild weakness noted to the right lower extremity.        ED Treatments / Results  Labs (all labs ordered are listed, but only abnormal results are displayed) Labs Reviewed  CBC - Abnormal; Notable for the following components:      Result Value   WBC 10.6 (*)    All other components within normal limits  DIFFERENTIAL - Abnormal; Notable for the following components:   Neutro Abs 8.6 (*)    All other components within normal limits  COMPREHENSIVE METABOLIC PANEL - Abnormal; Notable for the following components:   Glucose, Bld 115 (*)    All other components within normal limits  I-STAT CHEM 8, ED - Abnormal; Notable for the following components:   Glucose, Bld 113 (*)    Calcium, Ion 1.02 (*)    All other components within normal limits  CBG MONITORING, ED - Abnormal; Notable for the following components:   Glucose-Capillary 113 (*)    All other components within normal limits  SARS CORONAVIRUS 2 (HOSPITAL ORDER, [name] [facility])  ETHANOL  PROTIME-INR  APTT  RAPID URINE DRUG SCREEN, HOSP PERFORMED  URINALYSIS, ROUTINE W REFLEX MICROSCOPIC    EKG None   EKG reviewed by Dr. Roderic Palau.    Radiology Ct Head Code Stroke Wo Contrast  Result Date: 03/20/2019 CLINICAL DATA:  Code  stroke. Focal neuro deficit, > 6 hrs, stroke suspected. Right-sided weakness and facial droop EXAM: CT HEAD WITHOUT CONTRAST TECHNIQUE: Contiguous axial images were obtained from the base of the skull through the vertex without intravenous contrast. COMPARISON:  CT head without contrast 08/01/2011 FINDINGS: Brain: Extensive  periventricular and subcortical white matter hypoattenuation has progressed since the prior exam. Basal ganglia are intact the insular cortex is intact. Gray-white differentiation is preserved. No focal cortical infarct is present. Brainstem and cerebellum are normal. The ventricles are of normal size. No significant extraaxial fluid collection is present. Vascular: Atherosclerotic changes are present within the cavernous internal carotid arteries. There is no hyperdense vessel. Skull: Calvarium is intact. No focal lytic or blastic lesions are present. Sinuses/Orbits: The paranasal sinuses and mastoid air cells are clear. The globes and orbits are within normal limits. ASPECTS [facility] Stroke Program Early CT Score) - Ganglionic level infarction (caudate, lentiform nuclei, internal capsule, insula, M1-M3 cortex): 7/7 - Supraganglionic infarction (M4-M6 cortex): 3/3 Total score (0-10 with 10 being normal): 10/10 IMPRESSION: 1. No acute cortical or basal ganglia infarct. 2. Progression of diffuse white matter disease. Acute or subacute white matter infarct is not excluded. 3. Atherosclerosis 4. ASPECTS is 10/10 These results were called by telephone at the time of interpretation on 03/20/2019 at 2:30 pm to Dr. Verneda Skill, who verbally acknowledged these results. Electronically Signed   By: San Morelle M.D.   On: 03/20/2019 14:31    Procedures Procedures (including critical care time)  CRITICAL CARE Performed by: Terril Chestnut Total critical care time: 30 minutes Critical care time was exclusive of separately billable procedures and treating other patients. Critical care was necessary to treat or prevent imminent or life-threatening deterioration. Critical care was time spent personally by me on the following activities: development of treatment plan with patient and/or surrogate as well as nursing, discussions with consultants, evaluation of patient's response to treatment, examination of patient,  obtaining history from patient or surrogate, ordering and performing treatments and interventions, ordering and review of laboratory studies, ordering and review of radiographic studies, pulse oximetry and re-evaluation of patient's condition.   Medications Ordered in ED Medications  aspirin tablet 325 mg (325 mg Oral Given 03/20/19 1529)     Initial Impression / Assessment and Plan / ED Course  I have reviewed the triage vital signs and the nursing notes.  Pertinent labs & imaging results that were available during my care of the patient were reviewed by me and considered in my medical decision making (see chart for details).     pt with right sided deficit.  Last known well is unclear.  No dysphagia, mild dysarthria, but baseline per patent's son.  Discussed findings with Dr. Roderic Palau.  Pt out of window for thrombolytics, but w/in window for large vessel stroke, so Code stoke activated.    Tele neurology spoke with Dr. Roderic Palau and recommends ASA and stroke admission  1525  Consulted hospitalist, Dr. Nehemiah Settle who agrees to admit.    Final Clinical Impressions(s) / ED Diagnoses   Final diagnoses:  Ischemic stroke [facility])    ED Discharge Orders    None       Kem Parkinson, PA-C 03/20/19 1622    Milton Ferguson, MD 03/22/19 1626

## 2019-03-20 NOTE — Consult Note (Signed)
[facility] TeleNeurology Consult Services   Date of Service:   03/20/2019 14:16:58  Impression:     .  Left Hemispheric Infarct  Comments/Sign-Out: Patient presented with right sided weakness. HCT showed no acute process. no Alteplase was given due to outside of treatment window. Recommend ASA for secondary prevention.  Mechanism of Stroke: Possible Thromboembolic Small Vessel Disease  Metrics: Last Known Well: 03/19/2019 21:30:00 [facility] Notification Time: 03/20/2019 14:16:58 Arrival Time: 03/20/2019 13:42:00 Stamp Time: 03/20/2019 14:16:58 Time First Login Attempt: 03/20/2019 14:21:12 Video Start Time: 03/20/2019 14:21:12  Symptoms: Right sided weakness. NIHSS Start Assessment Time: 03/20/2019 14:23:46 Patient is not a candidate for tPA. Patient was not deemed candidate for tPA thrombolytics because of Last Well Known Above 4.5 Hours. Video End Time: 03/20/2019 14:32:39  CT head showed no acute hemorrhage or acute core infarct.  Clinical Presentation is not Suggestive of Large Vessel Occlusive Disease  ED Physician notified of diagnostic impression and management plan on 03/20/2019 14:32:38  Our recommendations are outlined below.  Recommendations:     .  Activate Stroke Protocol Admission/Order Set     .  Stroke/Telemetry Floor     .  Neuro Checks     .  Bedside Swallow Eval     .  DVT Prophylaxis     .  IV Fluids, Normal Saline     .  Head of Bed 30 Degrees     .  Euglycemia and Avoid Hyperthermia (PRN Acetaminophen)     .  Initiate Aspirin 325 MG Daily  Routine Consultation with [facility] for Follow up Care  Sign Out:     .  Discussed with Emergency Department Provider    ------------------------------------------------------------------------------  History of Present Illness: Patient is a 83 year old Female.  Patient was brought by private transportation with symptoms of Right sided weakness.  Past medical history of  Diabetes Mellitus presented with right sided weakness. Last seen normal by son was around 2130. However he spoke to her at 1000 AM and found her on the floor around 1300 and brought her to ER for further evaluation. No previous history or event per patietn  There is no history of hemorrhagic complications or intracranial hemorrhage. There is no history of Recent Anticoagulants. There is no history of recent major surgery.  Past Medical History:  Anticoagulant use:  No  Antiplatelet use: ASA  Examination: 1A: Level of Consciousness - Alert; keenly responsive + 0 1B: Ask Month and Age - Both Questions Right + 0 1C: Blink Eyes & Squeeze Hands - Performs Both Tasks + 0 2: Test Horizontal Extraocular Movements - Normal + 0 3: Test Visual Fields - No Visual Loss + 0 4: Test Facial Palsy (Use Grimace if Obtunded) - Normal symmetry + 0 5A: Test Left Arm Motor Drift - No Drift for 10 Seconds + 0 5B: Test Right Arm Motor Drift - Drift, but doesn't hit bed + 1 6A: Test Left Leg Motor Drift - Drift, but doesn't hit bed + 1 6B: Test Right Leg Motor Drift - No Drift for 5 Seconds + 0 7: Test Limb Ataxia (FNF/Heel-Shin) - No Ataxia + 0 8: Test Sensation - Normal; No sensory loss + 0 9: Test Language/Aphasia - Normal; No aphasia + 0 10: Test Dysarthria - Normal + 0 11: Test Extinction/Inattention - No abnormality + 0  NIHSS Score: 2  Patient/Family was informed the Neurology Consult would happen via TeleHealth consult by way of interactive audio and video telecommunications and consented  to receiving care in this manner.     Dr Hinda Lenis Johnothan Bascomb   [facility] [phone]  Case [id number]

## 2019-03-20 NOTE — Progress Notes (Signed)
CODE STROKE 1400 CALLTIME 1409 EXAM STARTED 1414 EXAM FINISHED, IMAGES TO SOC 1418 EXAM COMPLETED IN Epic CALLED [facility] - spoke with Marzetta Board

## 2019-03-20 NOTE — ED Notes (Signed)
Patient's son notified that patient was being transported at this time by [facility]. Floor was already called by Merry Lofty.

## 2019-03-20 NOTE — ED Triage Notes (Signed)
Pt brought in by son after she was found on the floor at home. Pt does not know what time she fell, but her automated medication dispenser alarms at 0800 and she had taken her morning medications. She did not answer her phone at 1030 so assuming happened sometime between. Pt has significant right sided weakness and facial droop without aphasia or neglect.

## 2019-03-20 NOTE — H&P (Signed)
History and Physical  Amy [name] [id number] DOB: 10-Nov-1935 DOA: 03/20/2019  Referring physician: Kem Parkinson, PA-C, ED provider PCP: Sinda Du, MD  Outpatient Specialists:   Patient Coming From: home  Chief Complaint: right arm weakness, fall  HPI: Amy [name] is a 83 y.o. female with a history of diabetes, anxiety and depression, invasive ductal carcinoma of the right breast on Femara, arthritis.  Patient seen for right arm weakness that started this morning.  Her symptoms appear to be worsening as she says it is becoming increasingly more difficult to use her right arm.  Weakness started this morning after rising.  Had difficulty doing ADLs such as getting dressed.  She did have a fall (unwitnessed) at home in the hallway.  At baseline, the patient walks with a walker and does have some difficulty ambulating.  Patient is unable to unable to say whether her legs felt weak over that she lost her balance.  She did have a difficult time getting up.  Emergency Department Course: Diffuse white matter disease.  No acute infarct noted.  Blood work relatively normal.  Coronavirus test negative.  Tele-neurologist consulted  Review of Systems:   Pt denies any fevers, chills, nausea, vomiting, diarrhea, constipation, abdominal pain, shortness of breath, dyspnea on exertion, orthopnea, cough, wheezing, palpitations, headache, vision changes, lightheadedness, dizziness, melena, rectal bleeding.  Review of systems are otherwise negative  Past Medical History:  Diagnosis Date   Anxiety    Arthritis    Back pain    Colon polyps    Depression    Diabetes mellitus, type II ([place])    diet controlled   Hypercholesterolemia    Invasive ductal carcinoma of breast, female, right ([place])    Osteopenia 11/26/2016   Skin-picking disorder    has 3 open wounds on right wrist that are being treated. About a quarter in size.   Past Surgical History:  Procedure Laterality Date    APPENDECTOMY     BREAST BIOPSY Right 10/09/2016   Procedure: BREAST BIOPSY WITH NEEDLE LOCALIZATION;  Surgeon: [name], MD;  Location: [facility];  Service: General;  Laterality: Right;   BREAST SURGERY Right    BUNIONECTOMY Bilateral    FACIAL COSMETIC SURGERY     drooping eyelid and brow   HERNIA REPAIR Right    Inguinal x2   KNEE ARTHROSCOPY Right    SHOULDER SURGERY Right    rotator cuff repair and removal of bone spur   TEMPOROMANDIBULAR JOINT SURGERY Right    TOTAL KNEE ARTHROPLASTY Right 05/05/2018   Procedure: TOTAL KNEE ARTHROPLASTY;  Surgeon: [name], MD;  Location: [facility];  Service: Orthopedics;  Laterality: Right;   Social History:  reports that she quit smoking about 23 years ago. Her smoking use included cigarettes. She has a 20.00 pack-year smoking history. She has never used smokeless tobacco. She reports that she does not drink alcohol or use drugs. Patient lives at home  Allergies  Allergen Reactions   Cymbalta [Duloxetine Hcl] Other (See Comments)    Hallucinated and couldn't think; got worse and worse the longer she took this.    Family History  Problem Relation Age of Onset   Anxiety disorder Mother    Depression Mother    Depression Maternal Grandmother    Alcohol abuse Brother    Cancer - Other Sister    ADD / ADHD Neg Hx    Bipolar disorder Neg Hx    Dementia Neg Hx    Drug abuse Neg  Hx    OCD Neg Hx    Paranoid behavior Neg Hx    Schizophrenia Neg Hx    Seizures Neg Hx    Sexual abuse Neg Hx    Physical abuse Neg Hx       Prior to Admission medications   Medication Sig Start Date End Date Taking? Authorizing Provider  ALPRAZolam Duanne Moron) 0.5 MG tablet TAKE 1 TABLET(0.5 MG) BY MOUTH DAILY AS NEEDED FOR ANXIETY 01/05/19   Cloria Spring, MD  aspirin EC 325 MG EC tablet Take 1 tablet (325 mg total) by mouth daily with breakfast. Patient not taking: Reported on 12/08/2018 05/08/18   [name], MD   calcium carbonate (OSCAL) 1500 (600 Ca) MG TABS tablet Take 600 mg of elemental calcium by mouth daily.    [provider]  cholecalciferol (VITAMIN D) 1000 units tablet Take 1 tablet (1,000 Units total) by mouth daily. 10/23/16   Baird Cancer, PA-C  escitalopram (LEXAPRO) 20 MG tablet Take 1 tablet (20 mg total) by mouth daily. 01/05/19   Cloria Spring, MD  gabapentin (NEURONTIN) 300 MG capsule Take 300 mg by mouth at bedtime.  03/27/18   [provider]  Glycerin-Hypromellose-PEG 400 (HM DRY EYE RELIEF) 0.2-0.2-1 % SOLN Apply 1 drop to eye daily as needed (dry eyes).    [provider]  ibuprofen (ADVIL,MOTRIN) 800 MG tablet Take 1 tablet (800 mg total) by mouth every 8 (eight) hours as needed. 07/01/18   [name], MD  letrozole [facility]) 2.5 MG tablet TAKE 1 TABLET(2.5 MG) BY MOUTH DAILY 01/28/19   Derek Jack, MD  metFORMIN (GLUCOPHAGE) 500 MG tablet Take 500 mg by mouth 2 (two) times daily with a meal.  06/30/16   [provider]  ONE TOUCH ULTRA TEST test strip USE TO TEST BLOOD SUGAR ONCE DAILY 12/02/18   [provider]  risperiDONE (RISPERDAL) 1 MG tablet Take 1 tablet (1 mg total) by mouth at bedtime. 01/05/19 01/05/20  Cloria Spring, MD  traZODone (DESYREL) 50 MG tablet TAKE 1 TABLET(50 MG) BY MOUTH AT BEDTIME 01/05/19   Cloria Spring, MD  venlafaxine XR (EFFEXOR-XR) 150 MG 24 hr capsule TAKE 1 CAPSULE(150 MG) BY MOUTH TWICE DAILY 01/05/19   Cloria Spring, MD    Physical Exam: BP (!) 148/78    Pulse 87    Temp 98.1 F (36.7 C) (Oral)    Resp 18    Ht 5\' 6"  (1.676 m)    Wt 109.7 kg    SpO2 99%    BMI 39.03 kg/m    General: Elderly female. Awake and alert and oriented x3. No acute cardiopulmonary distress.   HEENT: Normocephalic atraumatic.  Right and left ears normal in appearance.  Pupils equal, round, reactive to light. Extraocular muscles are intact. Sclerae anicteric and noninjected.  Moist mucosal membranes. No mucosal  lesions.   Neck: Neck supple without lymphadenopathy. No carotid bruits. No masses palpated.   Cardiovascular: Regular rate with normal S1-S2 sounds. No murmurs, rubs, gallops auscultated. No JVD.   Respiratory: Good respiratory effort with no wheezes, rales, rhonchi. Lungs clear to auscultation bilaterally.  No accessory muscle use.  Abdomen: Soft, nontender, nondistended. Active bowel sounds. No masses or hepatosplenomegaly   Skin: No rashes, lesions, or ulcerations.  Dry, warm to touch. 2+ dorsalis pedis and radial pulses.  Musculoskeletal: No calf or leg pain. All major joints not erythematous nontender.  No upper or lower joint deformation.  Good ROM.  No contractures   Psychiatric: Intact judgment and insight. Pleasant and cooperative.  Neurologic: Strength in right arm 2-3 out of 5.  Leg strength 5 out of 5 and symmetric.  Coordination intact in left arm.  Cranial nerves II through XII grossly intact.           Labs on Admission: I have personally reviewed following labs and imaging studies  CBC: Recent Labs  Lab 03/20/19 1348 03/20/19 1407  WBC 10.6*  --   NEUTROABS 8.6*  --   HGB 14.3 14.3  HCT 43.9 42.0  MCV 91.6  --   PLT 226  --    Basic Metabolic Panel: Recent Labs  Lab 03/20/19 1348 03/20/19 1407  NA 136 135  K 4.2 4.2  CL 101 102  CO2 25  --   GLUCOSE 115* 113*  BUN 19 19  CREATININE 0.72 0.80  CALCIUM 8.9  --    GFR: Estimated Creatinine Clearance: 69.2 mL/min (by C-G formula based on SCr of 0.8 mg/dL). Liver Function Tests: Recent Labs  Lab 03/20/19 1348  AST 19  ALT 12  ALKPHOS 66  BILITOT 0.6  PROT 6.9  ALBUMIN 3.9   No results for input(s): LIPASE, AMYLASE in the last 168 hours. No results for input(s): AMMONIA in the last 168 hours. Coagulation Profile: Recent Labs  Lab 03/20/19 1348  INR 1.0   Cardiac Enzymes: No results for input(s): CKTOTAL, CKMB, CKMBINDEX, TROPONINI in the last 168 hours. BNP (last 3 results) No  results for input(s): PROBNP in the last 8760 hours. HbA1C: No results for input(s): HGBA1C in the last 72 hours. CBG: Recent Labs  Lab 03/20/19 1405  GLUCAP 113*   Lipid Profile: No results for input(s): CHOL, HDL, LDLCALC, TRIG, CHOLHDL, LDLDIRECT in the last 72 hours. Thyroid Function Tests: No results for input(s): TSH, T4TOTAL, FREET4, T3FREE, THYROIDAB in the last 72 hours. Anemia Panel: No results for input(s): VITAMINB12, FOLATE, FERRITIN, TIBC, IRON, RETICCTPCT in the last 72 hours. Urine analysis: No results found for: COLORURINE, APPEARANCEUR, LABSPEC, PHURINE, GLUCOSEU, HGBUR, BILIRUBINUR, KETONESUR, PROTEINUR, UROBILINOGEN, NITRITE, LEUKOCYTESUR Sepsis Labs: @LABRCNTIP (procalcitonin:4,lacticidven:4) )No results found for this or any previous visit (from the past 240 hour(s)).   Radiological Exams on Admission: Ct Head Code Stroke Wo Contrast  Result Date: 03/20/2019 CLINICAL DATA:  Code stroke. Focal neuro deficit, > 6 hrs, stroke suspected. Right-sided weakness and facial droop EXAM: CT HEAD WITHOUT CONTRAST TECHNIQUE: Contiguous axial images were obtained from the base of the skull through the vertex without intravenous contrast. COMPARISON:  CT head without contrast 08/01/2011 FINDINGS: Brain: Extensive periventricular and subcortical white matter hypoattenuation has progressed since the prior exam. Basal ganglia are intact the insular cortex is intact. Gray-white differentiation is preserved. No focal cortical infarct is present. Brainstem and cerebellum are normal. The ventricles are of normal size. No significant extraaxial fluid collection is present. Vascular: Atherosclerotic changes are present within the cavernous internal carotid arteries. There is no hyperdense vessel. Skull: Calvarium is intact. No focal lytic or blastic lesions are present. Sinuses/Orbits: The paranasal sinuses and mastoid air cells are clear. The globes and orbits are within normal limits. ASPECTS  [facility] Stroke Program Early CT Score) - Ganglionic level infarction (caudate, lentiform nuclei, internal capsule, insula, M1-M3 cortex): 7/7 - Supraganglionic infarction (M4-M6 cortex): 3/3 Total score (0-10 with 10 being normal): 10/10 IMPRESSION: 1. No acute cortical or basal ganglia infarct. 2. Progression of diffuse white matter disease. Acute or subacute white matter infarct is not excluded. 3.  Atherosclerosis 4. ASPECTS is 10/10 These results were called by telephone at the time of interpretation on 03/20/2019 at 2:30 pm to Dr. Verneda Skill, who verbally acknowledged these results. Electronically Signed   By: San Morelle M.D.   On: 03/20/2019 14:31    EKG: Pending  Assessment/Plan: Principal Problem:   Ischemic stroke [facility]) Active Problems:   Depression with anxiety   Insomnia secondary to depression with anxiety   Abnormality of gait   Type 2 diabetes mellitus with neurological complications [facility])    This patient was discussed with the ED physician, including pertinent vitals, physical exam findings, labs, and imaging.  We also discussed care given by the ED provider.  1. Ischemic stroke Observation on telemetry MRI/MRA head Carotid Dopplers  Echocardiogram tomorrow Hemoglobin A1c, lipid panel in the morning PT/OT/speech therapy consult Full aspirin Hypertension 2. Type 2 diabetes a. Discontinue metformin b. Lantus 15 units at bedtime c. Sliding scale insulin d. CBGs before meals and nightly 3. Anxiety depression a. Continue home medication regimen 4. Abnormality of gait  DVT prophylaxis: SCDs Consultants: Neurology Code Status: Full code Family Communication: Son present Disposition Plan: Pending   Truett Mainland, DO

## 2019-03-21 ENCOUNTER — Inpatient Hospital Stay (HOSPITAL_COMMUNITY): Payer: Medicare HMO

## 2019-03-21 ENCOUNTER — Encounter (HOSPITAL_COMMUNITY): Payer: Self-pay | Admitting: Radiology

## 2019-03-21 DIAGNOSIS — I340 Nonrheumatic mitral (valve) insufficiency: Secondary | ICD-10-CM

## 2019-03-21 DIAGNOSIS — I255 Ischemic cardiomyopathy: Secondary | ICD-10-CM

## 2019-03-21 DIAGNOSIS — C50911 Malignant neoplasm of unspecified site of right female breast: Secondary | ICD-10-CM

## 2019-03-21 DIAGNOSIS — E785 Hyperlipidemia, unspecified: Secondary | ICD-10-CM

## 2019-03-21 DIAGNOSIS — I361 Nonrheumatic tricuspid (valve) insufficiency: Secondary | ICD-10-CM

## 2019-03-21 DIAGNOSIS — Z170 Estrogen receptor positive status [ER+]: Secondary | ICD-10-CM

## 2019-03-21 DIAGNOSIS — I10 Essential (primary) hypertension: Secondary | ICD-10-CM

## 2019-03-21 LAB — ECHOCARDIOGRAM COMPLETE
Height: 64 in
Weight: 2730.18 oz

## 2019-03-21 LAB — LIPID PANEL
Cholesterol: 264 mg/dL — ABNORMAL HIGH (ref 0–200)
HDL: 76 mg/dL (ref >40)
LDL Cholesterol: 153 mg/dL — ABNORMAL HIGH (ref 0–99)
Total CHOL/HDL Ratio: 3.5 RATIO
Triglycerides: 174 mg/dL — ABNORMAL HIGH (ref <150)
VLDL: 35 mg/dL (ref 0–40)

## 2019-03-21 LAB — GLUCOSE, CAPILLARY
Glucose-Capillary: 124 mg/dL — ABNORMAL HIGH (ref 70–99)
Glucose-Capillary: 260 mg/dL — ABNORMAL HIGH (ref 70–99)
Glucose-Capillary: 70 mg/dL (ref 70–99)
Glucose-Capillary: 100 mg/dL — ABNORMAL HIGH (ref 70–99)

## 2019-03-21 LAB — HEMOGLOBIN A1C
Hgb A1c MFr Bld: 6.3 % — ABNORMAL HIGH (ref 4.8–5.6)
Mean Plasma Glucose: 134.11 mg/dL

## 2019-03-21 MED ORDER — IOHEXOL 350 MG/ML SOLN
50.0000 mL | Freq: Once | INTRAVENOUS | Status: AC | PRN
  Administered 2019-03-21: 50 mL via INTRAVENOUS

## 2019-03-21 MED ORDER — CLOPIDOGREL BISULFATE 75 MG PO TABS
75.0000 mg | ORAL_TABLET | Freq: Every day | ORAL | Status: DC
  Administered 2019-03-21 – 2019-03-26 (×5): 75 mg via ORAL
  Filled 2019-03-21 (×6): qty 1

## 2019-03-21 MED ORDER — ASPIRIN EC 81 MG PO TBEC
81.0000 mg | DELAYED_RELEASE_TABLET | Freq: Every day | ORAL | Status: DC
  Administered 2019-03-22 – 2019-03-26 (×5): 81 mg via ORAL
  Filled 2019-03-21 (×5): qty 1

## 2019-03-21 MED ORDER — ATORVASTATIN CALCIUM 40 MG PO TABS
40.0000 mg | ORAL_TABLET | Freq: Every day | ORAL | Status: DC
  Administered 2019-03-21 – 2019-03-25 (×5): 40 mg via ORAL
  Filled 2019-03-21 (×5): qty 1

## 2019-03-21 MED ORDER — ENOXAPARIN SODIUM 40 MG/0.4ML ~~LOC~~ SOLN
40.0000 mg | SUBCUTANEOUS | Status: DC
  Administered 2019-03-21 – 2019-03-25 (×5): 40 mg via SUBCUTANEOUS
  Filled 2019-03-21 (×4): qty 0.4

## 2019-03-21 NOTE — Consult Note (Signed)
Referring Physician: Dr Sloan Leiter    Reason for Consult: Stroke  HPI: Amy [name] is an 83 y.o. female with a history of hyperlidemia, DM, Rt breast cancer stage 1a s/p surgery and on chemo with Femara, anxiety, depression, and osteopenia presented to [facility] ED after the patient was found on the floor at home with right sided weakness (NIHSS 2). Pt son and pt stated that she had some right arm weakness the night before while undress her shirt, she thought that was due to her shirt was too tight. Yesterday morning she woke up still had some right arm and leg weakness and she had to walk with walking and hold onto walls. However, around 11am she got up and walked in the hallway at home but fell and not able to get up by her own. Son came in and called 911. She was evaluated by tele neurology and patient did not receive tPA due to late presentation. CT no acute infarct. She was transported to [facility] for further evaluation and stroke workup. She has no previous stroke history. She took Aspirin 325 mg daily prior to admission.  Date last known well: 03/19/2019 Time last known well: 2130  tPA Given: no - out of the therapeutic window for IV tPA therapy  Past Medical History Past Medical History:  Diagnosis Date  . Anxiety   . Arthritis   . Back pain   . Colon polyps   . Depression   . Diabetes mellitus, type II ([place])    diet controlled  . Hypercholesterolemia   . Invasive ductal carcinoma of breast, female, right ([place])   . Osteopenia 11/26/2016  . Skin-picking disorder    has 3 open wounds on right wrist that are being treated. About a quarter in size.    Surgical History Past Surgical History:  Procedure Laterality Date  . APPENDECTOMY    . BREAST BIOPSY Right 10/09/2016   Procedure: BREAST BIOPSY WITH NEEDLE LOCALIZATION;  Surgeon: [name], MD;  Location: [facility]S;  Service: General;  Laterality: Right;  . BREAST SURGERY Right   . BUNIONECTOMY Bilateral   . FACIAL COSMETIC SURGERY      drooping eyelid and brow  . HERNIA REPAIR Right    Inguinal x2  . KNEE ARTHROSCOPY Right   . SHOULDER SURGERY Right    rotator cuff repair and removal of bone spur  . TEMPOROMANDIBULAR JOINT SURGERY Right   . TOTAL KNEE ARTHROPLASTY Right 05/05/2018   Procedure: TOTAL KNEE ARTHROPLASTY;  Surgeon: [name], MD;  Location: [facility];  Service: Orthopedics;  Laterality: Right;    Family History  Family History  Problem Relation Age of Onset  . Anxiety disorder Mother   . Depression Mother   . Depression Maternal Grandmother   . Alcohol abuse Brother   . Cancer - Other Sister   . ADD / ADHD Neg Hx   . Bipolar disorder Neg Hx   . Dementia Neg Hx   . Drug abuse Neg Hx   . OCD Neg Hx   . Paranoid behavior Neg Hx   . Schizophrenia Neg Hx   . Seizures Neg Hx   . Sexual abuse Neg Hx   . Physical abuse Neg Hx     Social History:   reports that she quit smoking about 23 years ago. Her smoking use included cigarettes. She has a 20.00 pack-year smoking history. She has never used smokeless tobacco. She reports that she does not drink alcohol or use drugs.  Allergies:  Allergies  Allergen Reactions  . Cymbalta [Duloxetine Hcl] Other (See Comments)    Hallucinated and couldn't think; got worse and worse the longer she took this.    Home Medications:  Medications Prior to Admission  Medication Sig Dispense Refill  . ALPRAZolam (XANAX) 0.5 MG tablet TAKE 1 TABLET(0.5 MG) BY MOUTH DAILY AS NEEDED FOR ANXIETY 30 tablet 2  . calcium carbonate (OSCAL) 1500 (600 Ca) MG TABS tablet Take 600 mg of elemental calcium by mouth daily.    . cholecalciferol (VITAMIN D) 1000 units tablet Take 1 tablet (1,000 Units total) by mouth daily. 30 tablet 11  . escitalopram (LEXAPRO) 20 MG tablet Take 1 tablet (20 mg total) by mouth daily. 30 tablet 2  . gabapentin (NEURONTIN) 300 MG capsule Take 300 mg by mouth at bedtime.   5  . ibuprofen (ADVIL,MOTRIN) 800 MG tablet Take 1 tablet (800 mg total) by  mouth every 8 (eight) hours as needed. 90 tablet 1  . letrozole (FEMARA) 2.5 MG tablet TAKE 1 TABLET(2.5 MG) BY MOUTH DAILY 30 tablet 3  . metFORMIN (GLUCOPHAGE) 500 MG tablet Take 500 mg by mouth 2 (two) times daily with a meal.     . risperiDONE (RISPERDAL) 1 MG tablet Take 1 tablet (1 mg total) by mouth at bedtime. 30 tablet 2  . traZODone (DESYREL) 50 MG tablet TAKE 1 TABLET(50 MG) BY MOUTH AT BEDTIME 30 tablet 2  . venlafaxine XR (EFFEXOR-XR) 150 MG 24 hr capsule TAKE 1 CAPSULE(150 MG) BY MOUTH TWICE DAILY (Patient taking differently: 150 mg daily with breakfast. ) 60 capsule 2  . aspirin EC 325 MG EC tablet Take 1 tablet (325 mg total) by mouth daily with breakfast. (Patient not taking: Reported on 12/08/2018) 30 tablet 0  . Glycerin-Hypromellose-PEG 400 (HM DRY EYE RELIEF) 0.2-0.2-1 % SOLN Apply 1 drop to eye daily as needed (dry eyes).    . ONE TOUCH ULTRA TEST test strip USE TO TEST BLOOD SUGAR ONCE DAILY      Hospital Medications . aspirin  325 mg Oral Q breakfast  . escitalopram  20 mg Oral Daily  . gabapentin  300 mg Oral QHS  . insulin aspart  0-15 Units Subcutaneous TID WC  . insulin aspart  0-5 Units Subcutaneous QHS  . insulin glargine  15 Units Subcutaneous QHS  . letrozole  2.5 mg Oral Daily  . risperiDONE  1 mg Oral QHS  . traZODone  50 mg Oral QHS  . venlafaxine XR  150 mg Oral Q breakfast    Review of Systems: ROS was attempted today and was able to be performed.  Systems assessed include - Constitutional, Eyes, HENT, Respiratory, Cardiovascular, Gastrointestinal, Genitourinary, Integument/breast, Hematologic/lymphatic, Musculoskeletal, Neurological, Behavioral/Psych, Endocrine,  Allergic/Immunologic - the patient complains of only the following symptoms, and all other reviewed systems are negative.  Physical Examination:   Temp:  [97.4 F (36.3 C)-98.3 F (36.8 C)] 97.5 F (36.4 C) (08/23 1652) Pulse Rate:  [65-96] 84 (08/23 1652) Resp:  [14-20] 16 (08/23  0806) BP: (108-155)/(54-92) 134/63 (08/23 1652) SpO2:  [88 %-100 %] 100 % (08/23 1652) Weight:  [77.4 kg-110 kg] 77.4 kg (08/22 2251)  General - Well nourished, well developed, in no apparent distress.  Ophthalmologic - fundi not visualized due to noncooperation.  Cardiovascular - Regular rate and rhythm.  Mental Status -  Level of arousal and orientation to self, age, place, and person were intact, but not orientated to time. Language including expression, naming, repetition, comprehension  was assessed and found intact.  Cranial Nerves II - XII - II - Visual field intact OU. III, IV, VI - Extraocular movements intact. V - Facial sensation intact bilaterally. VII - Facial movement intact bilaterally. VIII - Hearing & vestibular intact bilaterally. X - Palate elevates symmetrically. XI - Chin turning & shoulder shrug intact bilaterally. XII - Tongue protrusion intact.  Motor Strength - The patient's strength was normal in LUE however RUE 3/5 proximal and 4/5 distal. BLE proximal 4/5 and LLE distal 4-/5.  Bulk was normal and fasciculations were absent.   Motor Tone - Muscle tone was assessed at the neck and appendages and was normal.  Reflexes - The patient's reflexes were symmetrical in all extremities and she had no pathological reflexes.  Sensory - Light touch, temperature/pinprick were assessed and were decreased on the RLE.    Coordination - The patient had normal movements in left hand with no ataxia or dysmetria.  Tremor was absent.  Gait and Station - deferred.    LABORATORY STUDIES:  Basic Metabolic Panel: Recent Labs  Lab 03/20/19 1348 03/20/19 1407  NA 136 135  K 4.2 4.2  CL 101 102  CO2 25  --   GLUCOSE 115* 113*  BUN 19 19  CREATININE 0.72 0.80  CALCIUM 8.9  --     Liver Function Tests: Recent Labs  Lab 03/20/19 1348  AST 19  ALT 12  ALKPHOS 66  BILITOT 0.6  PROT 6.9  ALBUMIN 3.9   No results for input(s): LIPASE, AMYLASE in the last 168  hours. No results for input(s): AMMONIA in the last 168 hours.  CBC: Recent Labs  Lab 03/20/19 1348 03/20/19 1407  WBC 10.6*  --   NEUTROABS 8.6*  --   HGB 14.3 14.3  HCT 43.9 42.0  MCV 91.6  --   PLT 226  --     Cardiac Enzymes: No results for input(s): CKTOTAL, CKMB, CKMBINDEX, TROPONINI in the last 168 hours.  BNP: Invalid input(s): POCBNP  CBG: Recent Labs  Lab 03/20/19 1405 03/20/19 1815 03/20/19 2242 03/21/19 0601 03/21/19 1148  GLUCAP 113* 111* 114* 124* 260*    Microbiology:   Coagulation Studies: Recent Labs    03/20/19 1348  LABPROT 12.9  INR 1.0    Urinalysis:  Recent Labs  Lab 03/20/19 1348  COLORURINE YELLOW  LABSPEC 1.011  PHURINE 7.0  GLUCOSEU NEGATIVE  HGBUR NEGATIVE  BILIRUBINUR NEGATIVE  KETONESUR NEGATIVE  PROTEINUR NEGATIVE  NITRITE POSITIVE*  LEUKOCYTESUR TRACE*    Lipid Panel:     Component Value Date/Time   CHOL 264 (H) 03/21/2019 0647   TRIG 174 (H) 03/21/2019 0647   HDL 76 03/21/2019 0647   CHOLHDL 3.5 03/21/2019 0647   VLDL 35 03/21/2019 0647   LDLCALC 153 (H) 03/21/2019 0647    HgbA1C:  Lab Results  Component Value Date   HGBA1C 5.9 (H) 05/01/2018    Urine Drug Screen:      Component Value Date/Time   LABOPIA NONE DETECTED 03/20/2019 1348   COCAINSCRNUR NONE DETECTED 03/20/2019 1348   LABBENZ NONE DETECTED 03/20/2019 1348   AMPHETMU NONE DETECTED 03/20/2019 1348   THCU NONE DETECTED 03/20/2019 1348   LABBARB NONE DETECTED 03/20/2019 1348     Alcohol Level:  Recent Labs  Lab 03/20/19 1403  ETH <10    Miscellaneous labs:   IMAGING:  Ct Angio Head W Or Wo Contrast Ct Angio Neck W Or Wo Contrast 03/21/2019 IMPRESSION:  1. No emergent large vessel  occlusion.  2. Asymmetric prominence of MCA branch vessels over the left posterior frontal and parietal lobes. This may represent luxury perfusion following TIA or infarct. Gray-white differentiation is preserved without evidence of a significant  cortical infarct.  3. Mild atherosclerotic changes at the aortic arch and right carotid bifurcation without significant stenosis.  4. Multilevel degenerative changes in the cervical spine as described.  5. Scattered ground-glass attenuation lung apices likely represents atelectasis or edema.   MRI Head 03/21/2019 CLINICAL DATA:  Focal neuro deficit, > 6 hrs, stroke suspected. Confusion. Right-sided weakness. EXAM: MRI HEAD WITHOUT CONTRAST TECHNIQUE: Multiplanar, multiecho pulse sequences of the brain and surrounding structures were obtained without intravenous contrast. COMPARISON:  CT head without contrast 03/20/2019. CTA of the head and neck 03/21/2019. FINDINGS: Brain: The diffusion-weighted images demonstrate acute nonhemorrhagic infarct involving the left precentral gyrus measuring 1.5 cm cephalo caudad. A punctate area of restricted cortical diffusion is also present more posteriorly in the left parietal lobe. T2 signal changes are associated with the area of acute infarction. T2 signal changes are associated with the area of acute infarction. There are extensive periventricular and subcortical T2 changes bilaterally consistent with diffuse remote ischemia. Dilated perivascular spaces are present within the basal ganglia. Brainstem is normal. A single punctate remote lacunar infarct is present in the right cerebellum. The internal auditory canals are within normal limits. Vascular: Flow is present in the major intracranial arteries. Skull and upper cervical spine: Grade 1 anterolisthesis is again noted at C2-3 and C3-4. Advanced degenerative changes are noted at C4-5. Craniocervical junction is otherwise normal. Midline structures are normal. Sinuses/Orbits: The paranasal sinuses and mastoid air cells are clear. The globes and orbits are within normal limits. IMPRESSION: 1. 15 mm area of acute/subacute nonhemorrhagic infarct involving the left precentral gyrus, consistent with the patient's right upper  extremity weakness. 2. Additional punctate area of acute/subacute nonhemorrhagic infarct involving the left parietal lobe. 3. Diffuse advanced periventricular and subcortical white matter changes bilaterally reflect the sequela of chronic microvascular ischemia and multiple remote white matter infarcts. 4. Degenerative changes of the cervical spine. Electronically Signed   By: San Morelle M.D.   On: 03/21/2019 12:48   Ct Head Code Stroke Wo Contrast 03/20/2019 IMPRESSION:  1. No acute cortical or basal ganglia infarct.  2. Progression of diffuse white matter disease. Acute or subacute white matter infarct is not excluded.  3. Atherosclerosis  4. ASPECTS is 10/10   Transthoracic Echocardiogram   1. The left ventricle has moderately reduced systolic function, with an ejection fraction of 35-40%. The cavity size was normal. Left ventricular diastolic Doppler parameters are consistent with impaired relaxation. Left ventricular diffuse hypokinesis.  2. The right ventricle has normal systolic function. The cavity was normal. There is no increase in right ventricular wall thickness.  3. Mildly thickened tricuspid valve leaflets.  4. The aortic valve is tricuspid. Aortic valve regurgitation was not assessed by color flow Doppler. Mild stenosis of the aortic valve.  5. The aorta is normal unless otherwise noted.   Assessment: 83 y.o. female hyperlidemia, DM, Rt breast cancer, anxiety, depression, and osteopenia evaluated at [facility] ED by tele-neurology, Dr Hinda Lenis Plancher, after the patient was found on the floor at home with right sided weakness (NIHSS 2). She did not receive IV tPA secondary to late presentation (>4.5 hours from time of onset). She was transported to [facility] for further evaluation.  Stroke - small infarcts at left precentral gyrus and left superior parietal lobe, embolic pattern, source  unclear  Resultant  Left hemiparesis  CT head - No acute cortical or basal ganglia  infarct. Atherosclerosis.  MRI head - small infarcts at left precentral gyrus and left superior parietal lobe,  CTA H&N - unremarkable  2D Echo - EF 35-40% with diffuse hypokinesis  Recommend loop recorder to rule out afib  [employer] Virus 2 - negative  LDL - 153  HgbA1c 6.3  UDS - negative  VTE prophylaxis - SCDs  Diet  - Carb modified with thin liquids  aspirin 325 mg daily prior to admission, now on ASA 81 and plavix 75mg  DAPT. Continue DAPT for 3 weeks and then plavix alone  Patient counseled to be compliant with her antithrombotic medications  Ongoing aggressive stroke risk factor management  Therapy recommendations:  CIR  Disposition:  Pending  Cardiomyopathy   EF 35-40% with diffuse hypokinesis  No hx of CHF  Consider cardiology inpt consult or urgent cardiology outpt follow up  Breast cancer on Femara  Stage Ia in 2018  S/p lumpectomy  On Femara  There is about 2-3% stroke risk in pt taking Femara. Recommend pt to discuss with oncology to see if continue medication would be appropriate. Currently, do not think this is a big concern for stroke as pt has alternative cause to explain the stroke.  Hypertension  Stable . Permissive hypertension (OK if < 220/120) but gradually normalize in 5-7 days . Long-term BP goal normotensive  Hyperlipidemia  Lipid lowering medication PTA:  none  LDL 153, goal < 70  Current lipid lowering medication: lipitor 40  Continue statin at discharge  Diabetes  HgbA1c 6.3, goal < 7.0  Controlled  SSI  CBG monitoring  PCP follow up  Other Stroke Risk Factors  Advanced age  Former cigarette smoker - quit  Other Active Problems  Mild leukocytosis - 10.6 (afebrile)   Rosalin Hawking, MD PhD Stroke Neurology 03/21/2019 7:03 PM

## 2019-03-21 NOTE — Evaluation (Signed)
Physical Therapy Evaluation Patient Details Name: Amy [name] MRN: [id number] DOB: Dec 11, 1935 Today's Date: 03/21/2019   History of Present Illness  83 y.o. female with a history of diabetes, anxiety and depression, invasive ductal carcinoma of the right breast on Femara, arthritis.  Patient seen for right arm weakness and unwitnessed fall. CT negative, MRI pending.   Clinical Impression  Orders received for PT evaluation. Patient demonstrates deficits in functional mobility as indicated below. Will benefit from continued skilled PT to address deficits and maximize function. Will see as indicated and progress as tolerated.  Prior to admission, patient reports modified independence with mobility, assist from aide during the week. Given current deficits and fall history, feel patient will need post acute rehabilitation to maximize functional recovery. Recommend CIR consult at this time.    Follow Up Recommendations CIR    Equipment Recommendations  (TBD)    Recommendations for Other Services Rehab consult     Precautions / Restrictions Precautions Precautions: Fall Restrictions Weight Bearing Restrictions: No      Mobility  Bed Mobility Overal bed mobility: Needs Assistance Bed Mobility: Rolling;Sidelying to Sit;Sit to Sidelying Rolling: Min assist Sidelying to sit: Mod assist;+2 for physical assistance;+2 for safety/equipment     Sit to sidelying: Mod assist;+2 for physical assistance;+2 for safety/equipment General bed mobility comments: pt able to intiate movement towards EOB, but requires support for R LE and trunk; returned to supine with support of trunk and LEs; increased time and efffort required  Transfers Overall transfer level: Needs assistance Equipment used: 2 person hand held assist Transfers: Sit to/from Stand Sit to Stand: Mod assist;+2 physical assistance;+2 safety/equipment         General transfer comment: unable to grasp RW with R hand, 2 person  HHA with mod assist to ascend, safety and balance  Ambulation/Gait Ambulation/Gait assistance: Mod assist;+2 physical assistance Gait Distance (Feet): 8 Feet Assistive device: 2 person hand held assist Gait Pattern/deviations: Step-to pattern;Shuffle;Antalgic;Trunk flexed;Decreased step length - right;Decreased dorsiflexion - right Gait velocity: decreased Gait velocity interpretation: <1.31 ft/sec, indicative of household ambulator General Gait Details: patient with poor ability to advance RLE, noted functional weakness and limited by pain in Low back  Stairs            Wheelchair Mobility    Modified Rankin (Stroke Patients Only)       Balance Overall balance assessment: Needs assistance Sitting-balance support: No upper extremity supported;Feet supported Sitting balance-Leahy Scale: Poor Sitting balance - Comments: sitting EOB with minA to min guard, noted L lateral and posterior lean; pt with increased awareness throughout sitting but requires mod assist at times to correct Postural control: Posterior lean;Left lateral lean Standing balance support: Bilateral upper extremity supported;During functional activity Standing balance-Leahy Scale: Poor Standing balance comment: relaint on BUE and external support                             Pertinent Vitals/Pain Pain Assessment: Faces Faces Pain Scale: Hurts even more Pain Location: back Pain Descriptors / Indicators: Discomfort;Grimacing;Guarding Pain Intervention(s): Monitored during session;Repositioned    Home Living Family/patient expects to be discharged to:: Private residence Living Arrangements: Alone Available Help at Discharge: Family Type of Home: House Home Access: Stairs to enter Entrance Stairs-Rails: None Entrance Stairs-Number of Steps: 2 Home Layout: Two level;Able to live on main level with bedroom/bathroom(patient states she rarely goes upstairs) Home Equipment: Gilford Rile - 2 wheels;Cane -  single point;Bedside commode;Hospital bed  Prior Function Level of Independence: Needs assistance   Gait / Transfers Assistance Needed: uses RW for mobility   ADL's / Homemaking Assistance Needed: some assist with IADLs, independent ADLs; some driving   Comments: has assist with IADLs 4x/week, a few hours     Hand Dominance   Dominant Hand: Right    Extremity/Trunk Assessment   Upper Extremity Assessment Upper Extremity Assessment: Generalized weakness;RUE deficits/detail RUE Deficits / Details: grossly 3-/5, drift noted; unable to grasp functionally  RUE Sensation: decreased light touch;decreased proprioception RUE Coordination: decreased fine motor;decreased gross motor    Lower Extremity Assessment Lower Extremity Assessment: Generalized weakness;RLE deficits/detail RLE Deficits / Details: noted assymetrical weakness RLE with poor ability to coordinate strength RLE Sensation: decreased light touch RLE Coordination: decreased fine motor;decreased gross motor       Communication   Communication: HOH;Expressive difficulties(slight slurred speech)  Cognition Arousal/Alertness: Awake/alert Behavior During Therapy: WFL for tasks assessed/performed Overall Cognitive Status: Impaired/Different from baseline Area of Impairment: Attention;Memory;Following commands;Safety/judgement;Awareness;Problem solving                   Current Attention Level: Sustained Memory: Decreased short-term memory Following Commands: Follows one step commands consistently;Follows one step commands with increased time Safety/Judgement: Decreased awareness of safety;Decreased awareness of deficits Awareness: Emergent Problem Solving: Slow processing;Difficulty sequencing;Requires verbal cues;Decreased initiation General Comments: pt oriented, follow commands with increased time; improving awareness to deficits throughout session       General Comments      Exercises      Assessment/Plan    PT Assessment Patient needs continued PT services  PT Problem List Decreased strength;Decreased activity tolerance;Decreased balance;Decreased mobility;Decreased coordination;Decreased safety awareness;Pain       PT Treatment Interventions DME instruction;Gait training;Functional mobility training;Therapeutic activities;Therapeutic exercise;Balance training;Neuromuscular re-education;Patient/family education    PT Goals (Current goals can be found in the Care Plan section)  Acute Rehab PT Goals Patient Stated Goal: to get better PT Goal Formulation: With patient/family Time For Goal Achievement: 04/04/19 Potential to Achieve Goals: Good    Frequency Min 4X/week   Barriers to discharge Decreased caregiver support      Co-evaluation PT/OT/SLP Co-Evaluation/Treatment: Yes Reason for Co-Treatment: For patient/therapist safety;To address functional/ADL transfers PT goals addressed during session: Mobility/safety with mobility OT goals addressed during session: ADL's and self-care       AM-PAC PT "6 Clicks" Mobility  Outcome Measure Help needed turning from your back to your side while in a flat bed without using bedrails?: A Lot Help needed moving from lying on your back to sitting on the side of a flat bed without using bedrails?: A Lot Help needed moving to and from a bed to a chair (including a wheelchair)?: A Lot Help needed standing up from a chair using your arms (e.g., wheelchair or bedside chair)?: A Lot Help needed to walk in hospital room?: A Lot Help needed climbing 3-5 steps with a railing? : Total 6 Click Score: 11    End of Session Equipment Utilized During Treatment: Gait belt Activity Tolerance: Patient limited by pain Patient left: in bed;with call bell/phone within reach(with transport ) Nurse Communication: Mobility status PT Visit Diagnosis: Difficulty in walking, not elsewhere classified (R26.2);Other symptoms and signs involving the  nervous system [id number])    Time: [id number] PT Time Calculation (min) (ACUTE ONLY): 22 min   Charges:   PT Evaluation $PT Eval Moderate Complexity: 1 Mod          Alben Deeds, PT DPT  Board Certified Neurologic  Specialist Acute Rehabilitation Services Office [place] 03/21/2019, 1:32 PM

## 2019-03-21 NOTE — Evaluation (Signed)
Occupational Therapy Evaluation Patient Details Name: Amy [name] MRN: [id number] DOB: 1936/03/11 Today's Date: 03/21/2019    History of Present Illness 83 y.o. female with a history of diabetes, anxiety and depression, invasive ductal carcinoma of the right breast on Femara, arthritis.  Patient seen for right arm weakness and unwitnessed fall. CT negative, MRI pending.    Clinical Impression   PTA patient independent with ADLs, intermittent assistance for IADLs 4 days/week (several hours), and used RW for mobility.  Admitted for above and limited by problem list below, including R sided weakness, impaired coordination and sensation, impaired balance, pain (back), impaired cognition, and decreased activity tolerance.  She currently requires mod assist +2 for bed mobility and transfers, min-mod assist for UB ADLs and max-total assist for LB ADLs.  She is oriented and able to follow 1 step commands given increased time, but noted decreased safety awareness and problem solving.  Further visual assessment needed.  Will follow acutely, at this time recommend intensive CIR level rehab in order to maximize independence in hopes to return back to independent level with ADLs/mobility.      Follow Up Recommendations  CIR    Equipment Recommendations  3 in 1 bedside commode    Recommendations for Other Services Rehab consult;PT consult;Speech consult     Precautions / Restrictions Precautions Precautions: Fall Restrictions Weight Bearing Restrictions: No      Mobility Bed Mobility Overal bed mobility: Needs Assistance Bed Mobility: Rolling;Sidelying to Sit;Sit to Sidelying Rolling: Min assist Sidelying to sit: Mod assist;+2 for physical assistance;+2 for safety/equipment     Sit to sidelying: Mod assist;+2 for physical assistance;+2 for safety/equipment General bed mobility comments: pt able to intiate movement towards EOB, but requires support for R LE and trunk; returned to supine  with support of trunk and LEs; increased time and efffort required  Transfers Overall transfer level: Needs assistance Equipment used: 2 person hand held assist Transfers: Sit to/from Stand Sit to Stand: Mod assist;+2 physical assistance;+2 safety/equipment         General transfer comment: unable to grasp RW with R hand, 2 person HHA with mod assist to ascend, safety and balance    Balance Overall balance assessment: Needs assistance Sitting-balance support: No upper extremity supported;Feet supported Sitting balance-Leahy Scale: Poor Sitting balance - Comments: sitting EOB with minA to min guard, noted L lateral and posterior lean; pt with increased awareness throughout sitting but requires mod assist at times to correct Postural control: Posterior lean;Left lateral lean Standing balance support: Bilateral upper extremity supported;During functional activity Standing balance-Leahy Scale: Poor Standing balance comment: relaint on BUE and external support                           ADL either performed or assessed with clinical judgement   ADL Overall ADL's : Needs assistance/impaired     Grooming: Moderate assistance;Sitting Grooming Details (indicate cue type and reason): able to use L hand, but requires support for R hand functionally Upper Body Bathing: Moderate assistance;Sitting   Lower Body Bathing: Maximal assistance;Sit to/from stand;+2 for physical assistance   Upper Body Dressing : Moderate assistance;Sitting   Lower Body Dressing: +2 for physical assistance;+2 for safety/equipment;Total assistance;Sit to/from stand   Toilet Transfer: Moderate assistance;+2 for physical assistance;Ambulation Toilet Transfer Details (indicate cue type and reason): simulated to recliner  Toileting- Clothing Manipulation and Hygiene: Total assistance;+2 for physical assistance;Sit to/from stand       Functional mobility during ADLs:  Moderate assistance;+2 for physical  assistance General ADL Comments: pt limited by R sided weakness, impaired balance and pain      Vision Baseline Vision/History: Wears glasses Wears Glasses: At all times Patient Visual Report: Other (comment);No change from baseline(glasses not present ) Vision Assessment?: Vision impaired- to be further tested in functional context Additional Comments: quick assessment, able to track in all planes but slight nystagmus noted going from L to R horizontally; loss of focus on R side; continued assessment     Perception     Praxis      Pertinent Vitals/Pain Pain Assessment: Faces Faces Pain Scale: Hurts even more Pain Location: back Pain Descriptors / Indicators: Discomfort;Grimacing;Guarding Pain Intervention(s): Monitored during session;Repositioned     Hand Dominance Right   Extremity/Trunk Assessment Upper Extremity Assessment Upper Extremity Assessment: Generalized weakness;RUE deficits/detail RUE Deficits / Details: grossly 3-/5, drift noted; unable to grasp functionally  RUE Sensation: decreased light touch;decreased proprioception RUE Coordination: decreased fine motor;decreased gross motor   Lower Extremity Assessment Lower Extremity Assessment: Defer to PT evaluation       Communication Communication Communication: HOH;Expressive difficulties(slight slurred speech)   Cognition Arousal/Alertness: Awake/alert Behavior During Therapy: WFL for tasks assessed/performed Overall Cognitive Status: Impaired/Different from baseline Area of Impairment: Attention;Memory;Following commands;Safety/judgement;Awareness;Problem solving                   Current Attention Level: Sustained Memory: Decreased short-term memory Following Commands: Follows one step commands consistently;Follows one step commands with increased time Safety/Judgement: Decreased awareness of safety;Decreased awareness of deficits Awareness: Emergent Problem Solving: Slow processing;Difficulty  sequencing;Requires verbal cues;Decreased initiation General Comments: pt oriented, follow commands with increased time; improving awareness to deficits throughout session    General Comments       Exercises     Shoulder Instructions      Home Living Family/patient expects to be discharged to:: Private residence Living Arrangements: Alone Available Help at Discharge: Family Type of Home: House Home Access: Stairs to enter [employer] of Steps: 2 Entrance Stairs-Rails: None Home Layout: Two level;Able to live on main level with bedroom/bathroom(patient states she rarely goes upstairs) Alternate Level Stairs-Number of Steps: 17 Alternate Level Stairs-Rails: Left Bathroom Shower/Tub: Tub/shower unit   Bathroom Toilet: Standard Bathroom Accessibility: Yes   Home Equipment: Walker - 2 wheels;Cane - single point;Bedside commode;Hospital bed          Prior Functioning/Environment Level of Independence: Needs assistance  Gait / Transfers Assistance Needed: uses RW for mobility  ADL's / Homemaking Assistance Needed: some assist with IADLs, independent ADLs; some driving    Comments: has assist with IADLs 4x/week, a few hours        OT Problem List: Decreased strength;Decreased activity tolerance;Impaired balance (sitting and/or standing);Decreased range of motion;Decreased coordination;Decreased cognition;Impaired vision/perception;Decreased knowledge of use of DME or AE;Decreased knowledge of precautions;Pain;Impaired UE functional use;Impaired sensation;Impaired tone      OT Treatment/Interventions: Self-care/ADL training;DME and/or AE instruction;Neuromuscular education;Therapeutic activities;Cognitive remediation/compensation;Visual/perceptual remediation/compensation;Patient/family education;Balance training    OT Goals(Current goals can be found in the care plan section) Acute Rehab OT Goals Patient Stated Goal: to get better OT Goal Formulation: With  patient Time For Goal Achievement: 04/04/19 Potential to Achieve Goals: Good  OT Frequency: Min 2X/week   Barriers to D/C:            Co-evaluation PT/OT/SLP Co-Evaluation/Treatment: Yes Reason for Co-Treatment: For patient/therapist safety;To address functional/ADL transfers   OT goals addressed during session: ADL's and self-care      AM-PAC OT "  6 Clicks" Daily Activity     Outcome Measure Help from another person eating meals?: A Little Help from another person taking care of personal grooming?: A Lot Help from another person toileting, which includes using toliet, bedpan, or urinal?: A Lot Help from another person bathing (including washing, rinsing, drying)?: A Lot Help from another person to put on and taking off regular upper body clothing?: A Lot Help from another person to put on and taking off regular lower body clothing?: Total 6 Click Score: 12   End of Session Equipment Utilized During Treatment: Gait belt Nurse Communication: Mobility status  Activity Tolerance: Patient tolerated treatment well Patient left: in bed;with call bell/phone within reach;with bed alarm set;Other (comment)(transport arrived)  OT Visit Diagnosis: Other abnormalities of gait and mobility (R26.89);Other symptoms and signs involving the nervous system (R29.898);Hemiplegia and hemiparesis Hemiplegia - Right/Left: Right Hemiplegia - dominant/non-dominant: Dominant Hemiplegia - caused by: Unspecified                Time: [id number] OT Time Calculation (min): 22 min Charges:  OT General Charges $OT Visit: 1 Visit OT Evaluation $OT Eval Moderate Complexity: [place], OT Acute Rehabilitation Services Pager [phone] Office [phone]   Delight Stare 03/21/2019, 12:23 PM

## 2019-03-21 NOTE — Progress Notes (Addendum)
  Echocardiogram 2D Echocardiogram has been performed.  Technically difficult study due to poor patient compliance.   Amy [name] L Androw 03/21/2019, 4:50 PM

## 2019-03-21 NOTE — Progress Notes (Signed)
PROGRESS NOTE    Amy [name]  [id number] DOB: 03/03/1936 DOA: 03/20/2019 PCP: Sinda Du, MD    Brief Narrative:  83 year old female with history of diabetes, anxiety and depression, invasive ductal carcinoma the right breast on Femara and arthritis presented to the emergency room with right arm weakness since morning of the day of admission.  She had progressive weakness that prompted her to visit to the ER.  In the emergency room patient was found with right hemiplegia and acute stroke.  Head CT showed no acute process.  Outside TPA window.  Transferred to [facility] for further management, neurology consult.   Assessment & Plan:   Principal Problem:   Ischemic stroke ([place]) Active Problems:   Depression with anxiety   Insomnia secondary to depression with anxiety   Abnormality of gait   Type 2 diabetes mellitus with neurological complications (HCC)  Acute ischemic stroke: Clinical findings, presented with acute onset right hemiparesis, continue to have neurological symptoms.  Continue neurochecks and vital signs as per stroke protocol. CT head findings, initial CT scan was without any acute findings. MRI of the brain, right MCA territory stroke. CTA of the head and neck shows no emergent large vessel occlusion.  Mild atherosclerotic changes. 2D echocardiogram, pending. Antiplatelet therapy, patient on aspirin 325 at home continued.  May need dual antiplatelet therapy, will await neurology recommendation. LDL is 152 patient not on any statin at home.  Will start on atorvastatin 40 mg. Hemoglobin A1c, pending.  On insulin. DVT prophylaxis, SCDs Therapy recommendations, acute inpatient rehab Secondary risk factor modification including Hypertension, blood pressure on goal Diabetes, on insulin A1c pending  Type 2 diabetes: On metformin at home.  On hold.  Currently remains on insulin.  A1c pending.  Will change therapy depending upon outcome.  Depression and  anxiety: On multiple medications including gabapentin.  That she will continue.  Called and discussed with neurology stroke team for follow up.   DVT prophylaxis: SCDs Code Status: Full code Family Communication: None, called son Ronnald Ramp, will try back again.  Disposition Plan: Acute inpatient rehab   Consultants:   Neurology  Procedures:   None  Antimicrobials:   None   Subjective: Patient was seen and examined.  No overnight events.  Persistent weakness of the right hand and unable to use it.  Patient is stated that her weakness might have slightly improved but she is not able to do any meaningful use of the hand.  Eating with the left hand.  Objective: Vitals:   03/21/19 0343 03/21/19 0440 03/21/19 0644 03/21/19 0806  BP: (!) 132/54 (!) 143/68 137/78 (!) 137/92  Pulse: 65 89 84 86  Resp: 18 14 19 16   Temp: 98 F (36.7 C) 97.7 F (36.5 C) 97.7 F (36.5 C) (!) 97.4 F (36.3 C)  TempSrc: Oral Oral Oral Oral  SpO2: (!) 88% 96% 94% 100%  Weight:      Height:        Intake/Output Summary (Last 24 hours) at 03/21/2019 1340 Last data filed at 03/21/2019 0342 Gross per 24 hour  Intake 240 ml  Output 1100 ml  Net -860 ml   Filed Weights   03/20/19 1351 03/20/19 2105 03/20/19 2251  Weight: 109.7 kg 110 kg 77.4 kg    Examination:  General exam: Appears calm and comfortable  Respiratory system: Clear to auscultation. Respiratory effort normal. Cardiovascular system: S1 & S2 heard, RRR. No JVD, murmurs, rubs, gallops or clicks. No pedal edema. Gastrointestinal system: Abdomen  is nondistended, soft and nontender. No organomegaly or masses felt. Normal bowel sounds heard. Central nervous system: Alert and oriented.  Cranial nerves are normal. Extremities: Right upper extremity, 3/5.  Right lower extremity 4/5.  Left upper and lower extremity normal. Skin: No rashes, lesions or ulcers Psychiatry: Judgement and insight appear normal. Mood & affect appropriate.      Data Reviewed: I have personally reviewed following labs and imaging studies  CBC: Recent Labs  Lab 03/20/19 1348 03/20/19 1407  WBC 10.6*  --   NEUTROABS 8.6*  --   HGB 14.3 14.3  HCT 43.9 42.0  MCV 91.6  --   PLT 226  --    Basic Metabolic Panel: Recent Labs  Lab 03/20/19 1348 03/20/19 1407  NA 136 135  K 4.2 4.2  CL 101 102  CO2 25  --   GLUCOSE 115* 113*  BUN 19 19  CREATININE 0.72 0.80  CALCIUM 8.9  --    GFR: Estimated Creatinine Clearance: 55.5 mL/min (by C-G formula based on SCr of 0.8 mg/dL). Liver Function Tests: Recent Labs  Lab 03/20/19 1348  AST 19  ALT 12  ALKPHOS 66  BILITOT 0.6  PROT 6.9  ALBUMIN 3.9   No results for input(s): LIPASE, AMYLASE in the last 168 hours. No results for input(s): AMMONIA in the last 168 hours. Coagulation Profile: Recent Labs  Lab 03/20/19 1348  INR 1.0   Cardiac Enzymes: No results for input(s): CKTOTAL, CKMB, CKMBINDEX, TROPONINI in the last 168 hours. BNP (last 3 results) No results for input(s): PROBNP in the last 8760 hours. HbA1C: No results for input(s): HGBA1C in the last 72 hours. CBG: Recent Labs  Lab 03/20/19 1405 03/20/19 1815 03/20/19 2242 03/21/19 0601 03/21/19 1148  GLUCAP 113* 111* 114* 124* 260*   Lipid Profile: Recent Labs    03/21/19 0647  CHOL 264*  HDL 76  LDLCALC 153*  TRIG 174*  CHOLHDL 3.5   Thyroid Function Tests: No results for input(s): TSH, T4TOTAL, FREET4, T3FREE, THYROIDAB in the last 72 hours. Anemia Panel: No results for input(s): VITAMINB12, FOLATE, FERRITIN, TIBC, IRON, RETICCTPCT in the last 72 hours. Sepsis Labs: No results for input(s): PROCALCITON, LATICACIDVEN in the last 168 hours.  Recent Results (from the past 240 hour(s))  SARS Coronavirus 2 [facility] order, Performed in [facility] hospital lab) Nasopharyngeal Nasopharyngeal Swab     Status: None   Collection Time: 03/20/19  3:17 PM   Specimen: Nasopharyngeal Swab  Result Value Ref Range  Status   SARS Coronavirus 2 NEGATIVE NEGATIVE Final    Comment: (NOTE) If result is NEGATIVE SARS-CoV-2 target nucleic acids are NOT DETECTED. The SARS-CoV-2 RNA is generally detectable in upper and lower  respiratory specimens during the acute phase of infection. The lowest  concentration of SARS-CoV-2 viral copies this assay can detect is 250  copies / mL. A negative result does not preclude SARS-CoV-2 infection  and should not be used as the sole basis for treatment or other  patient management decisions.  A negative result may occur with  improper specimen collection / handling, submission of specimen other  than nasopharyngeal swab, presence of viral mutation(s) within the  areas targeted by this assay, and inadequate number of viral copies  (<250 copies / mL). A negative result must be combined with clinical  observations, patient history, and epidemiological information. If result is POSITIVE SARS-CoV-2 target nucleic acids are DETECTED. The SARS-CoV-2 RNA is generally detectable in upper and lower  respiratory specimens  dur ing the acute phase of infection.  Positive  results are indicative of active infection with SARS-CoV-2.  Clinical  correlation with patient history and other diagnostic information is  necessary to determine patient infection status.  Positive results do  not rule out bacterial infection or co-infection with other viruses. If result is PRESUMPTIVE POSTIVE SARS-CoV-2 nucleic acids MAY BE PRESENT.   A presumptive positive result was obtained on the submitted specimen  and confirmed on repeat testing.  While 2019 novel coronavirus  (SARS-CoV-2) nucleic acids may be present in the submitted sample  additional confirmatory testing may be necessary for epidemiological  and / or clinical management purposes  to differentiate between  SARS-CoV-2 and other Sarbecovirus currently known to infect humans.  If clinically indicated additional testing with an alternate  test  methodology [phone]) is advised. The SARS-CoV-2 RNA is generally  detectable in upper and lower respiratory sp ecimens during the acute  phase of infection. The expected result is Negative. Fact Sheet for Patients:  StrictlyIdeas.no Fact Sheet for Healthcare Providers: BankingDealers.co.za This test is not yet approved or cleared by the [place] FDA and has been authorized for detection and/or diagnosis of SARS-CoV-2 by FDA under an Emergency Use Authorization (EUA).  This EUA will remain in effect (meaning this test can be used) for the duration of the COVID-19 declaration under Section 564(b)(1) of the Act, 21 U.S.C. section 360bbb-3(b)(1), unless the authorization is terminated or revoked sooner. Performed at [facility], [address], [place], [place] [address]          Radiology Studies: Ct Angio Head W Or Wo Contrast  Result Date: 03/21/2019 CLINICAL DATA:  Stroke, follow-up. EXAM: CT ANGIOGRAPHY HEAD AND NECK TECHNIQUE: Multidetector CT imaging of the head and neck was performed using the standard protocol during bolus administration of intravenous contrast. Multiplanar CT image reconstructions and MIPs were obtained to evaluate the vascular anatomy. Carotid stenosis measurements (when applicable) are obtained utilizing NASCET criteria, using the distal internal carotid diameter as the denominator. CONTRAST:  58mL OMNIPAQUE IOHEXOL 350 MG/ML SOLN COMPARISON:  CT head without contrast 03/20/2019. FINDINGS: CTA NECK FINDINGS Aortic arch: Atherosclerotic calcifications are present at the origin of the left subclavian artery without a significant stenosis. Additional calcifications are present in the distal aortic arch and descending thoracic aorta without aneurysm. Great vessel origins are otherwise within normal limits. Right carotid system: The right common carotid artery is within normal limits. Calcifications are present  at the right carotid bifurcation without a significant stenosis. There is moderate tortuosity in the proximal cervical right ICA without a significant stenosis. Left carotid system: The left common carotid artery is within normal limits. Bifurcation is unremarkable. There is moderate tortuosity of the cervical left ICA without a significant stenosis. Vertebral arteries: The vertebral arteries originate from the subclavian arteries bilaterally. The right vertebral artery is dominant. There is no significant stenosis of either vertebral artery in the neck. Skeleton: Levoconvex curvature of the cervical spine is centered at C4. Grade 1 anterolisthesis is present at C3-4. There is chronic loss of disc height and endplate change at D34-534, C5-6, and C6-7. No focal lytic or blastic lesions are present. Other neck: No focal mucosal or submucosal lesions are present. Nasopharynx is unremarkable. Soft palate and tongue base are normal. Salivary glands are within normal limits. No significant adenopathy is present. Vocal cords are midline and symmetric. Thyroid is normal. Upper chest: Scattered ground-glass attenuation is present without focal consolidation. There is no nodule or mass  lesion. No pneumothorax is present. The thoracic inlet is normal. There is mild prominence pericardial fluid. Review of the MIP images confirms the above findings CTA HEAD FINDINGS Anterior circulation: Minimal atherosclerotic changes are present within the cavernous left internal carotid artery. There is no significant stenosis of either carotid artery through the ICA terminus. The A1 and M1 segments are normal. The anterior communicating artery is patent. There is no significant proximal stenosis or occlusion. There is some asymmetric vascularity over the posterior left frontal and parietal lobe without cortical gray matter differentiation loss. Posterior circulation: The right vertebral artery is the dominant vessel. PICA origins are visualized  and normal. Vertebrobasilar junction is normal. The basilar artery is unremarkable. Both posterior cerebral arteries originate from the basilar tip. Mild irregularity is present in the posterior cerebral arteries without a significant stenosis. Venous sinuses: The dural sinuses are patent. Straight sinus deep cerebral veins are intact. Right transverse sinus is dominant. Cortical veins are unremarkable. Anatomic variants: None Review of the MIP images confirms the above findings IMPRESSION: 1. No emergent large vessel occlusion. 2. Asymmetric prominence of MCA branch vessels over the left posterior frontal and parietal lobes. This may represent luxury perfusion following TIA or infarct. Gray-white differentiation is preserved without evidence of a significant cortical infarct. 3. Mild atherosclerotic changes at the aortic arch and right carotid bifurcation without significant stenosis. 4. Multilevel degenerative changes in the cervical spine as described. 5. Scattered ground-glass attenuation lung apices likely represents atelectasis or edema. Electronically Signed   By: San Morelle M.D.   On: 03/21/2019 11:05   Ct Angio Neck W Or Wo Contrast  Result Date: 03/21/2019 CLINICAL DATA:  Stroke, follow-up. EXAM: CT ANGIOGRAPHY HEAD AND NECK TECHNIQUE: Multidetector CT imaging of the head and neck was performed using the standard protocol during bolus administration of intravenous contrast. Multiplanar CT image reconstructions and MIPs were obtained to evaluate the vascular anatomy. Carotid stenosis measurements (when applicable) are obtained utilizing NASCET criteria, using the distal internal carotid diameter as the denominator. CONTRAST:  73mL OMNIPAQUE IOHEXOL 350 MG/ML SOLN COMPARISON:  CT head without contrast 03/20/2019. FINDINGS: CTA NECK FINDINGS Aortic arch: Atherosclerotic calcifications are present at the origin of the left subclavian artery without a significant stenosis. Additional  calcifications are present in the distal aortic arch and descending thoracic aorta without aneurysm. Great vessel origins are otherwise within normal limits. Right carotid system: The right common carotid artery is within normal limits. Calcifications are present at the right carotid bifurcation without a significant stenosis. There is moderate tortuosity in the proximal cervical right ICA without a significant stenosis. Left carotid system: The left common carotid artery is within normal limits. Bifurcation is unremarkable. There is moderate tortuosity of the cervical left ICA without a significant stenosis. Vertebral arteries: The vertebral arteries originate from the subclavian arteries bilaterally. The right vertebral artery is dominant. There is no significant stenosis of either vertebral artery in the neck. Skeleton: Levoconvex curvature of the cervical spine is centered at C4. Grade 1 anterolisthesis is present at C3-4. There is chronic loss of disc height and endplate change at D34-534, C5-6, and C6-7. No focal lytic or blastic lesions are present. Other neck: No focal mucosal or submucosal lesions are present. Nasopharynx is unremarkable. Soft palate and tongue base are normal. Salivary glands are within normal limits. No significant adenopathy is present. Vocal cords are midline and symmetric. Thyroid is normal. Upper chest: Scattered ground-glass attenuation is present without focal consolidation. There is no nodule or mass  lesion. No pneumothorax is present. The thoracic inlet is normal. There is mild prominence pericardial fluid. Review of the MIP images confirms the above findings CTA HEAD FINDINGS Anterior circulation: Minimal atherosclerotic changes are present within the cavernous left internal carotid artery. There is no significant stenosis of either carotid artery through the ICA terminus. The A1 and M1 segments are normal. The anterior communicating artery is patent. There is no significant  proximal stenosis or occlusion. There is some asymmetric vascularity over the posterior left frontal and parietal lobe without cortical gray matter differentiation loss. Posterior circulation: The right vertebral artery is the dominant vessel. PICA origins are visualized and normal. Vertebrobasilar junction is normal. The basilar artery is unremarkable. Both posterior cerebral arteries originate from the basilar tip. Mild irregularity is present in the posterior cerebral arteries without a significant stenosis. Venous sinuses: The dural sinuses are patent. Straight sinus deep cerebral veins are intact. Right transverse sinus is dominant. Cortical veins are unremarkable. Anatomic variants: None Review of the MIP images confirms the above findings IMPRESSION: 1. No emergent large vessel occlusion. 2. Asymmetric prominence of MCA branch vessels over the left posterior frontal and parietal lobes. This may represent luxury perfusion following TIA or infarct. Gray-white differentiation is preserved without evidence of a significant cortical infarct. 3. Mild atherosclerotic changes at the aortic arch and right carotid bifurcation without significant stenosis. 4. Multilevel degenerative changes in the cervical spine as described. 5. Scattered ground-glass attenuation lung apices likely represents atelectasis or edema. Electronically Signed   By: San Morelle M.D.   On: 03/21/2019 11:05   Mr Brain Wo Contrast  Result Date: 03/21/2019 CLINICAL DATA:  Focal neuro deficit, > 6 hrs, stroke suspected. Confusion. Right-sided weakness. EXAM: MRI HEAD WITHOUT CONTRAST TECHNIQUE: Multiplanar, multiecho pulse sequences of the brain and surrounding structures were obtained without intravenous contrast. COMPARISON:  CT head without contrast 03/20/2019. CTA of the head and neck 03/21/2019. FINDINGS: Brain: The diffusion-weighted images demonstrate acute nonhemorrhagic infarct involving the left precentral gyrus measuring 1.5  cm cephalo caudad. A punctate area of restricted cortical diffusion is also present more posteriorly in the left parietal lobe. T2 signal changes are associated with the area of acute infarction. T2 signal changes are associated with the area of acute infarction. There are extensive periventricular and subcortical T2 changes bilaterally consistent with diffuse remote ischemia. Dilated perivascular spaces are present within the basal ganglia. Brainstem is normal. A single punctate remote lacunar infarct is present in the right cerebellum. The internal auditory canals are within normal limits. Vascular: Flow is present in the major intracranial arteries. Skull and upper cervical spine: Grade 1 anterolisthesis is again noted at C2-3 and C3-4. Advanced degenerative changes are noted at C4-5. Craniocervical junction is otherwise normal. Midline structures are normal. Sinuses/Orbits: The paranasal sinuses and mastoid air cells are clear. The globes and orbits are within normal limits. IMPRESSION: 1. 15 mm area of acute/subacute nonhemorrhagic infarct involving the left precentral gyrus, consistent with the patient's right upper extremity weakness. 2. Additional punctate area of acute/subacute nonhemorrhagic infarct involving the left parietal lobe. 3. Diffuse advanced periventricular and subcortical white matter changes bilaterally reflect the sequela of chronic microvascular ischemia and multiple remote white matter infarcts. 4. Degenerative changes of the cervical spine. Electronically Signed   By: San Morelle M.D.   On: 03/21/2019 12:48   Ct Head Code Stroke Wo Contrast  Result Date: 03/20/2019 CLINICAL DATA:  Code stroke. Focal neuro deficit, > 6 hrs, stroke suspected. Right-sided weakness and facial  droop EXAM: CT HEAD WITHOUT CONTRAST TECHNIQUE: Contiguous axial images were obtained from the base of the skull through the vertex without intravenous contrast. COMPARISON:  CT head without contrast  08/01/2011 FINDINGS: Brain: Extensive periventricular and subcortical white matter hypoattenuation has progressed since the prior exam. Basal ganglia are intact the insular cortex is intact. Gray-white differentiation is preserved. No focal cortical infarct is present. Brainstem and cerebellum are normal. The ventricles are of normal size. No significant extraaxial fluid collection is present. Vascular: Atherosclerotic changes are present within the cavernous internal carotid arteries. There is no hyperdense vessel. Skull: Calvarium is intact. No focal lytic or blastic lesions are present. Sinuses/Orbits: The paranasal sinuses and mastoid air cells are clear. The globes and orbits are within normal limits. ASPECTS [facility] Stroke Program Early CT Score) - Ganglionic level infarction (caudate, lentiform nuclei, internal capsule, insula, M1-M3 cortex): 7/7 - Supraganglionic infarction (M4-M6 cortex): 3/3 Total score (0-10 with 10 being normal): 10/10 IMPRESSION: 1. No acute cortical or basal ganglia infarct. 2. Progression of diffuse white matter disease. Acute or subacute white matter infarct is not excluded. 3. Atherosclerosis 4. ASPECTS is 10/10 These results were called by telephone at the time of interpretation on 03/20/2019 at 2:30 pm to Dr. Verneda Skill, who verbally acknowledged these results. Electronically Signed   By: San Morelle M.D.   On: 03/20/2019 14:31        Scheduled Meds:  aspirin  325 mg Oral Q breakfast   escitalopram  20 mg Oral Daily   gabapentin  300 mg Oral QHS   insulin aspart  0-15 Units Subcutaneous TID WC   insulin aspart  0-5 Units Subcutaneous QHS   insulin glargine  15 Units Subcutaneous QHS   letrozole  2.5 mg Oral Daily   risperiDONE  1 mg Oral QHS   traZODone  50 mg Oral QHS   venlafaxine XR  150 mg Oral Q breakfast   Continuous Infusions:   LOS: 1 day    Time spent: 35 minutes    Barb Merino, MD [facility] Pager  [phone]  If 7PM-7AM, please contact night-coverage [email or web address] Password [email or web address] 03/21/2019, 1:40 PM

## 2019-03-22 DIAGNOSIS — I5020 Unspecified systolic (congestive) heart failure: Secondary | ICD-10-CM

## 2019-03-22 DIAGNOSIS — I639 Cerebral infarction, unspecified: Secondary | ICD-10-CM

## 2019-03-22 LAB — GLUCOSE, CAPILLARY
Glucose-Capillary: 151 mg/dL — ABNORMAL HIGH (ref 70–99)
Glucose-Capillary: 118 mg/dL — ABNORMAL HIGH (ref 70–99)
Glucose-Capillary: 113 mg/dL — ABNORMAL HIGH (ref 70–99)
Glucose-Capillary: 129 mg/dL — ABNORMAL HIGH (ref 70–99)

## 2019-03-22 MED ORDER — SODIUM CHLORIDE 0.9 % IV SOLN
INTRAVENOUS | Status: DC
  Administered 2019-03-22: 20:00:00 via INTRAVENOUS

## 2019-03-22 NOTE — Consult Note (Addendum)
Advanced Heart Failure Team Consult Note   Primary Physician: Sinda Du, MD PCP-Cardiologist:  No primary care provider on file.  Reason for Consultation: Heart Failure   HPI:    Amy [name] is seen today for evaluation of heart failure  at the request of Dr Sloan Leiter.   Amy [name] is an 83 year old with history of DM, anxiety, depression, R breast cancer , S/P right lumpectomy on femara, and arthritis. No history of coronary disease.   Presented to [facility] after being found on the floor by her son with R sided weakness. She had some R sided weakness the night before while dressing. She did not receive TPA. Transferred to [facility] for stroke work up.   Brain MRI showed  1. 15 mm area of acute/subacute nonhemorrhagic infarct involving the left precentral gyrus, consistent with the patient's right upper extremity weakness. 2. Additional punctate area of acute/subacute nonhemorrhagic infarct involving the left parietal lobe.  CTA showed mild atherosclerotic plaquing in aortic arch and R carotid.   Neuro felt may be cardio-embolic.   Placed on aspirin and plavix. ECHO was completed and showed reduced EF 35-40% (Dr. Haroldine Laws felt 40-45% with diffuse HK) and normal RV. PT following and recommending CIR.   Denies chest pain. Denies shortness of breath. At baseline has not been very functional due to recent back surgery and other ortho issues.   Denies h/o AF. Snores frequently.    Review of Systems: [y] = yes, [ ]  = no    General: Weight gain [ ] ; Weight loss [ ] ; Anorexia [ ] ; Fatigue Blue.Reese ]; Fever [ ] ; Chills [ ] ; Weakness [ Y]   Cardiac: Chest pain/pressure [ ] ; Resting SOB [ ] ; Exertional SOB [ ] ; Orthopnea [ ] ; Pedal Edema [ ] ; Palpitations [ ] ; Syncope [ ] ; Presyncope [ ] ; Paroxysmal nocturnal dyspnea[ ]    Pulmonary: Cough [ ] ; Wheezing[ ] ; Hemoptysis[ ] ; Sputum [ ] ; Snoring Blue.Reese ]   GI: Vomiting[ ] ; Dysphagia[ ] ; Melena[ ] ; Hematochezia [ ] ; Heartburn[ ] ; Abdominal pain  [ ] ; Constipation [ ] ; Diarrhea [ ] ; BRBPR [ ]    GU: Hematuria[ ] ; Dysuria [ ] ; Nocturia[ ]    Vascular: Pain in legs with walking [ ] ; Pain in feet with lying flat [ ] ; Non-healing sores [ ] ; Stroke [ ] ; TIA [ ] ; Slurred speech [ ] ;   Neuro: Headaches[ ] ; Vertigo[ ] ; Seizures[ ] ; Paresthesias[ ] ;Blurred vision [ ] ; Diplopia [ ] ; Vision changes [ ]    Ortho/Skin: Arthritis [Y ]; Joint pain [ Y]; Muscle pain [ ] ; Joint swelling [ ] ; Back Pain [Y ]; Rash [ ]    Psych: Depression[ ] ; Anxiety[ ]    Heme: Bleeding problems [ ] ; Clotting disorders [ ] ; Anemia [ ]    Endocrine: Diabetes [Y ]; Thyroid dysfunction[ ]   Home Medications Prior to Admission medications   Medication Sig Start Date End Date Taking? Authorizing Provider  ALPRAZolam Duanne Moron) 0.5 MG tablet TAKE 1 TABLET(0.5 MG) BY MOUTH DAILY AS NEEDED FOR ANXIETY 01/05/19  Yes Cloria Spring, MD  calcium carbonate (OSCAL) 1500 (600 Ca) MG TABS tablet Take 600 mg of elemental calcium by mouth daily.   Yes [provider]  cholecalciferol (VITAMIN D) 1000 units tablet Take 1 tablet (1,000 Units total) by mouth daily. 10/23/16  Yes Kefalas, Manon Hilding, PA-C  escitalopram (LEXAPRO) 20 MG tablet Take 1 tablet (20 mg total) by mouth daily. 01/05/19  Yes Cloria Spring, MD  gabapentin (NEURONTIN) 300 MG  capsule Take 300 mg by mouth at bedtime.  03/27/18  Yes [provider]  ibuprofen (ADVIL,MOTRIN) 800 MG tablet Take 1 tablet (800 mg total) by mouth every 8 (eight) hours as needed. 07/01/18  Yes [name], MD  letrozole [facility]) 2.5 MG tablet TAKE 1 TABLET(2.5 MG) BY MOUTH DAILY 01/28/19  Yes Derek Jack, MD  metFORMIN (GLUCOPHAGE) 500 MG tablet Take 500 mg by mouth 2 (two) times daily with a meal.  06/30/16  Yes [provider]  risperiDONE (RISPERDAL) 1 MG tablet Take 1 tablet (1 mg total) by mouth at bedtime. 01/05/19 01/05/20 Yes Cloria Spring, MD  traZODone (DESYREL) 50 MG tablet TAKE 1 TABLET(50 MG) BY MOUTH AT  BEDTIME 01/05/19  Yes Cloria Spring, MD  venlafaxine XR (EFFEXOR-XR) 150 MG 24 hr capsule TAKE 1 CAPSULE(150 MG) BY MOUTH TWICE DAILY Patient taking differently: 150 mg daily with breakfast.  01/05/19  Yes Cloria Spring, MD  aspirin EC 325 MG EC tablet Take 1 tablet (325 mg total) by mouth daily with breakfast. Patient not taking: Reported on 12/08/2018 05/08/18   [name], MD  Glycerin-Hypromellose-PEG 400 (HM DRY EYE RELIEF) 0.2-0.2-1 % SOLN Apply 1 drop to eye daily as needed (dry eyes).    [provider]  ONE TOUCH ULTRA TEST test strip USE TO TEST BLOOD SUGAR ONCE DAILY 12/02/18   [provider]    Past Medical History: Past Medical History:  Diagnosis Date   Anxiety    Arthritis    Back pain    Colon polyps    Depression    Diabetes mellitus, type II ([place])    diet controlled   Hypercholesterolemia    Invasive ductal carcinoma of breast, female, right ([place])    Osteopenia 11/26/2016   Skin-picking disorder    has 3 open wounds on right wrist that are being treated. About a quarter in size.    Past Surgical History: Past Surgical History:  Procedure Laterality Date   APPENDECTOMY     BREAST BIOPSY Right 10/09/2016   Procedure: BREAST BIOPSY WITH NEEDLE LOCALIZATION;  Surgeon: [name], MD;  Location: [facility];  Service: General;  Laterality: Right;   BREAST SURGERY Right    BUNIONECTOMY Bilateral    FACIAL COSMETIC SURGERY     drooping eyelid and brow   HERNIA REPAIR Right    Inguinal x2   KNEE ARTHROSCOPY Right    SHOULDER SURGERY Right    rotator cuff repair and removal of bone spur   TEMPOROMANDIBULAR JOINT SURGERY Right    TOTAL KNEE ARTHROPLASTY Right 05/05/2018   Procedure: TOTAL KNEE ARTHROPLASTY;  Surgeon: [name], MD;  Location: [facility];  Service: Orthopedics;  Laterality: Right;    Family History: Family History  Problem Relation Age of Onset   Anxiety disorder Mother    Depression Mother     Depression Maternal Grandmother    Alcohol abuse Brother    Cancer - Other Sister    ADD / ADHD Neg Hx    Bipolar disorder Neg Hx    Dementia Neg Hx    Drug abuse Neg Hx    OCD Neg Hx    Paranoid behavior Neg Hx    Schizophrenia Neg Hx    Seizures Neg Hx    Sexual abuse Neg Hx    Physical abuse Neg Hx     Social History: Social History   Socioeconomic History   Marital status: Divorced    Spouse name:  Not on file   Number of children: Not on file   Years of education: Not on file   Highest education level: Not on file  Occupational History   Occupation: Nurse    Comment: Retired  Scientist, product/process development strain: Not hard at [employer] insecurity    Worry: Never true    Inability: Never true   Transportation needs    Medical: No    Non-medical: No  Tobacco Use   Smoking status: Former Smoker    Packs/day: 0.50    Years: 40.00    Pack years: 20.00    Types: Cigarettes    Quit date: 09/04/1995    Years since quitting: 23.5   Smokeless tobacco: Never Used  Substance and Sexual Activity   Alcohol use: No   Drug use: No    Types: Benzodiazepines, Hydrocodone   Sexual activity: Never  Lifestyle   Physical activity    Days per week: 0 days    Minutes per session: 0 min   Stress: Only a little  Relationships   Social connections    Talks on phone: Once a week    Gets together: Never    Attends religious service: More than 4 times per year    Active member of club or organization: No    Attends meetings of clubs or organizations: Never    Relationship status: Divorced  Other Topics Concern   Not on file  Social History Narrative   Not on file    Allergies:  Allergies  Allergen Reactions   Cymbalta [Duloxetine Hcl] Other (See Comments)    Hallucinated and couldn't think; got worse and worse the longer she took this.    Objective:    Vital Signs:   Temp:  [97.5 F (36.4 C)-98.6 F (37 C)] 97.6 F (36.4 C)  (08/24 0833) Pulse Rate:  [83-92] 89 (08/24 0833) Resp:  [17-18] 17 (08/24 0833) BP: (115-138)/(53-77) 124/73 (08/24 0833) SpO2:  [95 %-100 %] 99 % (08/24 0833)    Weight change: Filed Weights   03/20/19 1351 03/20/19 2105 03/20/19 2251  Weight: 109.7 kg 110 kg 77.4 kg    Intake/Output:   Intake/Output Summary (Last 24 hours) at 03/22/2019 1058 Last data filed at 03/22/2019 0024 Gross per 24 hour  Intake 240 ml  Output 850 ml  Net -610 ml      Physical Exam    General: Elderly.  No resp difficulty HEENT: normal Neck: supple. JVP flat  . Carotids 2+ bilat; no bruits. No lymphadenopathy or thyromegaly appreciated. Cor: PMI nondisplaced. Regular rate & rhythm. No rubs, gallops or murmurs. Lungs: clear Abdomen: soft, nontender, nondistended. No hepatosplenomegaly. No bruits or masses. Good bowel sounds. Extremities: no cyanosis, clubbing, rash, edema Neuro: alert & orientedx3, cranial nerves grossly intact. moves all 4 extremities. RUE weakness. Affect flat    Telemetry   NSR 90s Personally reviewed   EKG    SR 88 bpm occasional PVC No significnat ST-T abnormalities  personally reviewed.   Labs   Basic Metabolic Panel: Recent Labs  Lab 03/20/19 1348 03/20/19 1407  NA 136 135  K 4.2 4.2  CL 101 102  CO2 25  --   GLUCOSE 115* 113*  BUN 19 19  CREATININE 0.72 0.80  CALCIUM 8.9  --     Liver Function Tests: Recent Labs  Lab 03/20/19 1348  AST 19  ALT 12  ALKPHOS 66  BILITOT 0.6  PROT 6.9  ALBUMIN 3.9  No results for input(s): LIPASE, AMYLASE in the last 168 hours. No results for input(s): AMMONIA in the last 168 hours.  CBC: Recent Labs  Lab 03/20/19 1348 03/20/19 1407  WBC 10.6*  --   NEUTROABS 8.6*  --   HGB 14.3 14.3  HCT 43.9 42.0  MCV 91.6  --   PLT 226  --     Cardiac Enzymes: No results for input(s): CKTOTAL, CKMB, CKMBINDEX, TROPONINI in the last 168 hours.  BNP: BNP (last 3 results) No results for input(s): BNP in the last  8760 hours.  ProBNP (last 3 results) No results for input(s): PROBNP in the last 8760 hours.   CBG: Recent Labs  Lab 03/21/19 0601 03/21/19 1148 03/21/19 1653 03/21/19 2145 03/22/19 0623  GLUCAP 124* 260* 70 100* 151*    Coagulation Studies: Recent Labs    03/20/19 1348  LABPROT 12.9  INR 1.0     Imaging   Mr Brain Wo Contrast  Result Date: 03/21/2019 CLINICAL DATA:  Focal neuro deficit, > 6 hrs, stroke suspected. Confusion. Right-sided weakness. EXAM: MRI HEAD WITHOUT CONTRAST TECHNIQUE: Multiplanar, multiecho pulse sequences of the brain and surrounding structures were obtained without intravenous contrast. COMPARISON:  CT head without contrast 03/20/2019. CTA of the head and neck 03/21/2019. FINDINGS: Brain: The diffusion-weighted images demonstrate acute nonhemorrhagic infarct involving the left precentral gyrus measuring 1.5 cm cephalo caudad. A punctate area of restricted cortical diffusion is also present more posteriorly in the left parietal lobe. T2 signal changes are associated with the area of acute infarction. T2 signal changes are associated with the area of acute infarction. There are extensive periventricular and subcortical T2 changes bilaterally consistent with diffuse remote ischemia. Dilated perivascular spaces are present within the basal ganglia. Brainstem is normal. A single punctate remote lacunar infarct is present in the right cerebellum. The internal auditory canals are within normal limits. Vascular: Flow is present in the major intracranial arteries. Skull and upper cervical spine: Grade 1 anterolisthesis is again noted at C2-3 and C3-4. Advanced degenerative changes are noted at C4-5. Craniocervical junction is otherwise normal. Midline structures are normal. Sinuses/Orbits: The paranasal sinuses and mastoid air cells are clear. The globes and orbits are within normal limits. IMPRESSION: 1. 15 mm area of acute/subacute nonhemorrhagic infarct involving the  left precentral gyrus, consistent with the patient's right upper extremity weakness. 2. Additional punctate area of acute/subacute nonhemorrhagic infarct involving the left parietal lobe. 3. Diffuse advanced periventricular and subcortical white matter changes bilaterally reflect the sequela of chronic microvascular ischemia and multiple remote white matter infarcts. 4. Degenerative changes of the cervical spine. Electronically Signed   By: San Morelle M.D.   On: 03/21/2019 12:48      Medications:     Current Medications:  aspirin  81 mg Oral Q breakfast   atorvastatin  40 mg Oral q1800   clopidogrel  75 mg Oral Daily   enoxaparin (LOVENOX) injection  40 mg Subcutaneous Q24H   escitalopram  20 mg Oral Daily   gabapentin  300 mg Oral QHS   insulin aspart  0-15 Units Subcutaneous TID WC   insulin aspart  0-5 Units Subcutaneous QHS   insulin glargine  15 Units Subcutaneous QHS   letrozole  2.5 mg Oral Daily   risperiDONE  1 mg Oral QHS   traZODone  50 mg Oral QHS   venlafaxine XR  150 mg Oral Q breakfast     Infusions:     Assessment/Plan   1. Acute CVA  CT head - No acute cortical or basal ganglia infarct. Atherosclerosis.  MRI head - small infarcts at left precentral gyrus and left superior parietal lobe. -Possibly cardioembolic.  - Planning for loop recorder per EP.  - Set up for TEE for tomorrow.  - On statin, plavix, asa.   2. Newly diagnosed Systolic HF.  No previous history of Coronary diseased. No chest pain. Set up for coronary CT after TEE.  Reviewed by Dr Haroldine Laws.   ECHO EF 40-45%.  -Set up for TEE for tomorrow  - Volume status stable.  Will start low losartan in the next day or so. Keep SBP > 120.   3. Breast Cancer , R 2018  S/P R Lumpectomy. On Femara   4.  HTN  Stable. Keep BP 120-130  5. DMII   Length of Stay: 2  Darrick Grinder, NP  03/22/2019, 10:58 AM  Advanced Heart Failure Team Pager [phone] (M-F; 7a - 4p)  Please  contact [facility] for night-coverage after hours (4p -7a ) and weekends on [email or web address]  Patient seen and examined with the above-signed Advanced Practice Provider and/or Housestaff. I personally reviewed laboratory data, imaging studies and relevant notes. I independently examined the patient and formulated the important aspects of the plan. I have edited the note to reflect any of my changes or salient points. I have personally discussed the plan with the patient and/or family.  82 y/o woman with h/o DM and breast cancer. No h/o known CAD or AF. Presented with acute CVA. Neuro feels may be cardio-embolic. Echo with EF 40-45% with diffuse HK. Denies any cardiac symptoms: No palpitations, CP or SOB. Snores heavily but has never had sleep study  On exam General:  Elderly weak appearing. No resp difficulty HEENT: normal Neck: supple. no JVD. Carotids 2+ bilat; no bruits. No lymphadenopathy or thryomegaly appreciated. Cor: PMI nondisplaced. Regular rate & rhythm. No rubs, gallops or murmurs. Lungs: clear Abdomen: obese soft, nontender, nondistended. No hepatosplenomegaly. No bruits or masses. Good bowel sounds. Extremities: no cyanosis, clubbing, rash, edema Neuro: alert & orientedx3, cranial nerves grossly intact. moves all 4 extremities w/o difficulty. Affect pleasant  Given concern of cardio-embolic origin will plan TEE and ILR placement. EF only mildly reduced without regional wall motion abnormality so my concern for ischemic origin is less.   Agree with ASA and Plavix and statin for now. Will not start b-blocker or losartan yet in an effort to preserve BP. Can start in 1 week or so. Will consider outpatient cardiac CTA to look for CAD. Likely can follow with general cardiology on d/c as no clinical CHF and EF only mildly reduced.   Glori Bickers, MD  3:36 PM

## 2019-03-22 NOTE — Progress Notes (Signed)
PROGRESS NOTE    Amy [name]  [id number] DOB: Nov 27, 1935 DOA: 03/20/2019 PCP: Sinda Du, MD    Brief Narrative:  83 year old female with history of diabetes, anxiety and depression, invasive ductal carcinoma the right breast s/p mastectomy and now on Femara and arthritis presented to the emergency room with right arm weakness since morning of the day of admission.  She had progressive weakness that prompted her to visit to the ER.  In the emergency room patient was found with right hemiplegia and acute stroke.  Head CT showed no acute process.  Outside TPA window.  Transferred to [facility] for further management, neurology consult.   Assessment & Plan:   Principal Problem:   Ischemic stroke ([place]) Active Problems:   Depression with anxiety   Insomnia secondary to depression with anxiety   Abnormality of gait   Type 2 diabetes mellitus with neurological complications (HCC)  Acute ischemic stroke: Clinical findings, presented with acute onset right hemiparesis, continue to have neurological symptoms.  Continue neurochecks and vital signs as per stroke protocol. CT head findings, initial CT scan was without any acute findings. MRI of the brain, left MCA territory stroke.  CTA of the head and neck shows no emergent large vessel occlusion.  Mild atherosclerotic changes. 2D echocardiogram, ef 35%.  No clinical evidence of congestive heart failure or fluid overload.  Denies any history of coronary artery disease or congestive heart failure.  Euvolemic. Antiplatelet therapy, patient on aspirin 325 at home continued.  Dual antiplatelet therapy for 3 weeks and continue Plavix after that.   LDL is 152 patient not on any statin at home.  Started on atorvastatin 40 mg. Hemoglobin A1c, 6.3.  Currently on insulin.  On metformin at home.  Her blood sugars are acceptable on her long-term metformin therapy.  Will resume on discharge. DVT prophylaxis, SCDs Therapy recommendations,  acute inpatient rehab Secondary risk factor modification including Hypertension, blood pressure on goal Cardiology consulted for loop recorder.  Type 2 diabetes: On metformin at home.  On hold.  Currently remains on insulin.  A1c 6.3   Depression and anxiety: On multiple medications including gabapentin.  That she will continue.  Chronic congestive heart failure: With no history of coronary artery disease or history of congestive heart failure.  Currently euvolemic.  May need ischemia work-up subsequently.  Heart failure team consulted and case discussed.   DVT prophylaxis: SCDs Code Status: Full code Family Communication: None, will try to talk to patient's son. Disposition Plan: Acute inpatient rehab.  When available.   Consultants:   Neurology  Cardiology.  Procedures:   None  Antimicrobials:   None   Subjective: Patient seen and examined.  No overnight events.  Does not have good grip on the right hand.  Her right leg is weaker, however does not feel obvious weakness as she feels weakness of the right arm.   Objective: Vitals:   03/21/19 2021 03/21/19 2353 03/22/19 0309 03/22/19 0833  BP: 138/77 (!) 119/53 (!) 115/59 124/73  Pulse: 83 92 89 89  Resp: 17 18 17 17   Temp: 98.4 F (36.9 C) 98.6 F (37 C) 98.6 F (37 C) 97.6 F (36.4 C)  TempSrc: Oral   Oral  SpO2: 98% 95% 96% 99%  Weight:      Height:        Intake/Output Summary (Last 24 hours) at 03/22/2019 1042 Last data filed at 03/22/2019 0024 Gross per 24 hour  Intake 240 ml  Output 850 ml  Net -610 ml   Filed Weights   03/20/19 1351 03/20/19 2105 03/20/19 2251  Weight: 109.7 kg 110 kg 77.4 kg    Examination:  General exam: Appears calm and comfortable  Respiratory system: Clear to auscultation. Respiratory effort normal. Cardiovascular system: S1 & S2 heard, RRR. No JVD, murmurs, rubs, gallops or clicks. No pedal edema. Gastrointestinal system: Abdomen is nondistended, soft and nontender. No  organomegaly or masses felt. Normal bowel sounds heard. Central nervous system: Alert and oriented.  Cranial nerves are normal. Extremities: Right upper extremity, 3/5.  Right lower extremity 4/5.  Left upper and lower extremity normal. Skin: No rashes, lesions or ulcers Psychiatry: Judgement and insight appear normal. Mood & affect appropriate.     Data Reviewed: I have personally reviewed following labs and imaging studies  CBC: Recent Labs  Lab 03/20/19 1348 03/20/19 1407  WBC 10.6*  --   NEUTROABS 8.6*  --   HGB 14.3 14.3  HCT 43.9 42.0  MCV 91.6  --   PLT 226  --    Basic Metabolic Panel: Recent Labs  Lab 03/20/19 1348 03/20/19 1407  NA 136 135  K 4.2 4.2  CL 101 102  CO2 25  --   GLUCOSE 115* 113*  BUN 19 19  CREATININE 0.72 0.80  CALCIUM 8.9  --    GFR: Estimated Creatinine Clearance: 55.5 mL/min (by C-G formula based on SCr of 0.8 mg/dL). Liver Function Tests: Recent Labs  Lab 03/20/19 1348  AST 19  ALT 12  ALKPHOS 66  BILITOT 0.6  PROT 6.9  ALBUMIN 3.9   No results for input(s): LIPASE, AMYLASE in the last 168 hours. No results for input(s): AMMONIA in the last 168 hours. Coagulation Profile: Recent Labs  Lab 03/20/19 1348  INR 1.0   Cardiac Enzymes: No results for input(s): CKTOTAL, CKMB, CKMBINDEX, TROPONINI in the last 168 hours. BNP (last 3 results) No results for input(s): PROBNP in the last 8760 hours. HbA1C: Recent Labs    03/21/19 0647  HGBA1C 6.3*   CBG: Recent Labs  Lab 03/21/19 0601 03/21/19 1148 03/21/19 1653 03/21/19 2145 03/22/19 0623  GLUCAP 124* 260* 70 100* 151*   Lipid Profile: Recent Labs    03/21/19 0647  CHOL 264*  HDL 76  LDLCALC 153*  TRIG 174*  CHOLHDL 3.5   Thyroid Function Tests: No results for input(s): TSH, T4TOTAL, FREET4, T3FREE, THYROIDAB in the last 72 hours. Anemia Panel: No results for input(s): VITAMINB12, FOLATE, FERRITIN, TIBC, IRON, RETICCTPCT in the last 72 hours. Sepsis  Labs: No results for input(s): PROCALCITON, LATICACIDVEN in the last 168 hours.  Recent Results (from the past 240 hour(s))  SARS Coronavirus 2 [facility] order, Performed in [facility] hospital lab) Nasopharyngeal Nasopharyngeal Swab     Status: None   Collection Time: 03/20/19  3:17 PM   Specimen: Nasopharyngeal Swab  Result Value Ref Range Status   SARS Coronavirus 2 NEGATIVE NEGATIVE Final    Comment: (NOTE) If result is NEGATIVE SARS-CoV-2 target nucleic acids are NOT DETECTED. The SARS-CoV-2 RNA is generally detectable in upper and lower  respiratory specimens during the acute phase of infection. The lowest  concentration of SARS-CoV-2 viral copies this assay can detect is 250  copies / mL. A negative result does not preclude SARS-CoV-2 infection  and should not be used as the sole basis for treatment or other  patient management decisions.  A negative result may occur with  improper specimen collection / handling, submission of specimen other  than nasopharyngeal swab, presence of viral mutation(s) within the  areas targeted by this assay, and inadequate number of viral copies  (<250 copies / mL). A negative result must be combined with clinical  observations, patient history, and epidemiological information. If result is POSITIVE SARS-CoV-2 target nucleic acids are DETECTED. The SARS-CoV-2 RNA is generally detectable in upper and lower  respiratory specimens dur ing the acute phase of infection.  Positive  results are indicative of active infection with SARS-CoV-2.  Clinical  correlation with patient history and other diagnostic information is  necessary to determine patient infection status.  Positive results do  not rule out bacterial infection or co-infection with other viruses. If result is PRESUMPTIVE POSTIVE SARS-CoV-2 nucleic acids MAY BE PRESENT.   A presumptive positive result was obtained on the submitted specimen  and confirmed on repeat testing.  While 2019  novel coronavirus  (SARS-CoV-2) nucleic acids may be present in the submitted sample  additional confirmatory testing may be necessary for epidemiological  and / or clinical management purposes  to differentiate between  SARS-CoV-2 and other Sarbecovirus currently known to infect humans.  If clinically indicated additional testing with an alternate test  methodology [phone]) is advised. The SARS-CoV-2 RNA is generally  detectable in upper and lower respiratory sp ecimens during the acute  phase of infection. The expected result is Negative. Fact Sheet for Patients:  StrictlyIdeas.no Fact Sheet for Healthcare Providers: BankingDealers.co.za This test is not yet approved or cleared by the [place] FDA and has been authorized for detection and/or diagnosis of SARS-CoV-2 by FDA under an Emergency Use Authorization (EUA).  This EUA will remain in effect (meaning this test can be used) for the duration of the COVID-19 declaration under Section 564(b)(1) of the Act, 21 U.S.C. section 360bbb-3(b)(1), unless the authorization is terminated or revoked sooner. Performed at [facility], [address], [place], [place] [address]          Radiology Studies: Ct Angio Head W Or Wo Contrast  Result Date: 03/21/2019 CLINICAL DATA:  Stroke, follow-up. EXAM: CT ANGIOGRAPHY HEAD AND NECK TECHNIQUE: Multidetector CT imaging of the head and neck was performed using the standard protocol during bolus administration of intravenous contrast. Multiplanar CT image reconstructions and MIPs were obtained to evaluate the vascular anatomy. Carotid stenosis measurements (when applicable) are obtained utilizing NASCET criteria, using the distal internal carotid diameter as the denominator. CONTRAST:  38mL OMNIPAQUE IOHEXOL 350 MG/ML SOLN COMPARISON:  CT head without contrast 03/20/2019. FINDINGS: CTA NECK FINDINGS Aortic arch: Atherosclerotic calcifications are  present at the origin of the left subclavian artery without a significant stenosis. Additional calcifications are present in the distal aortic arch and descending thoracic aorta without aneurysm. Great vessel origins are otherwise within normal limits. Right carotid system: The right common carotid artery is within normal limits. Calcifications are present at the right carotid bifurcation without a significant stenosis. There is moderate tortuosity in the proximal cervical right ICA without a significant stenosis. Left carotid system: The left common carotid artery is within normal limits. Bifurcation is unremarkable. There is moderate tortuosity of the cervical left ICA without a significant stenosis. Vertebral arteries: The vertebral arteries originate from the subclavian arteries bilaterally. The right vertebral artery is dominant. There is no significant stenosis of either vertebral artery in the neck. Skeleton: Levoconvex curvature of the cervical spine is centered at C4. Grade 1 anterolisthesis is present at C3-4. There is chronic loss of disc height and endplate change at D34-534, C5-6, and C6-7.  No focal lytic or blastic lesions are present. Other neck: No focal mucosal or submucosal lesions are present. Nasopharynx is unremarkable. Soft palate and tongue base are normal. Salivary glands are within normal limits. No significant adenopathy is present. Vocal cords are midline and symmetric. Thyroid is normal. Upper chest: Scattered ground-glass attenuation is present without focal consolidation. There is no nodule or mass lesion. No pneumothorax is present. The thoracic inlet is normal. There is mild prominence pericardial fluid. Review of the MIP images confirms the above findings CTA HEAD FINDINGS Anterior circulation: Minimal atherosclerotic changes are present within the cavernous left internal carotid artery. There is no significant stenosis of either carotid artery through the ICA terminus. The A1 and M1  segments are normal. The anterior communicating artery is patent. There is no significant proximal stenosis or occlusion. There is some asymmetric vascularity over the posterior left frontal and parietal lobe without cortical gray matter differentiation loss. Posterior circulation: The right vertebral artery is the dominant vessel. PICA origins are visualized and normal. Vertebrobasilar junction is normal. The basilar artery is unremarkable. Both posterior cerebral arteries originate from the basilar tip. Mild irregularity is present in the posterior cerebral arteries without a significant stenosis. Venous sinuses: The dural sinuses are patent. Straight sinus deep cerebral veins are intact. Right transverse sinus is dominant. Cortical veins are unremarkable. Anatomic variants: None Review of the MIP images confirms the above findings IMPRESSION: 1. No emergent large vessel occlusion. 2. Asymmetric prominence of MCA branch vessels over the left posterior frontal and parietal lobes. This may represent luxury perfusion following TIA or infarct. Gray-white differentiation is preserved without evidence of a significant cortical infarct. 3. Mild atherosclerotic changes at the aortic arch and right carotid bifurcation without significant stenosis. 4. Multilevel degenerative changes in the cervical spine as described. 5. Scattered ground-glass attenuation lung apices likely represents atelectasis or edema. Electronically Signed   By: San Morelle M.D.   On: 03/21/2019 11:05   Ct Angio Neck W Or Wo Contrast  Result Date: 03/21/2019 CLINICAL DATA:  Stroke, follow-up. EXAM: CT ANGIOGRAPHY HEAD AND NECK TECHNIQUE: Multidetector CT imaging of the head and neck was performed using the standard protocol during bolus administration of intravenous contrast. Multiplanar CT image reconstructions and MIPs were obtained to evaluate the vascular anatomy. Carotid stenosis measurements (when applicable) are obtained utilizing  NASCET criteria, using the distal internal carotid diameter as the denominator. CONTRAST:  51mL OMNIPAQUE IOHEXOL 350 MG/ML SOLN COMPARISON:  CT head without contrast 03/20/2019. FINDINGS: CTA NECK FINDINGS Aortic arch: Atherosclerotic calcifications are present at the origin of the left subclavian artery without a significant stenosis. Additional calcifications are present in the distal aortic arch and descending thoracic aorta without aneurysm. Great vessel origins are otherwise within normal limits. Right carotid system: The right common carotid artery is within normal limits. Calcifications are present at the right carotid bifurcation without a significant stenosis. There is moderate tortuosity in the proximal cervical right ICA without a significant stenosis. Left carotid system: The left common carotid artery is within normal limits. Bifurcation is unremarkable. There is moderate tortuosity of the cervical left ICA without a significant stenosis. Vertebral arteries: The vertebral arteries originate from the subclavian arteries bilaterally. The right vertebral artery is dominant. There is no significant stenosis of either vertebral artery in the neck. Skeleton: Levoconvex curvature of the cervical spine is centered at C4. Grade 1 anterolisthesis is present at C3-4. There is chronic loss of disc height and endplate change at D34-534, C5-6, and C6-7.  No focal lytic or blastic lesions are present. Other neck: No focal mucosal or submucosal lesions are present. Nasopharynx is unremarkable. Soft palate and tongue base are normal. Salivary glands are within normal limits. No significant adenopathy is present. Vocal cords are midline and symmetric. Thyroid is normal. Upper chest: Scattered ground-glass attenuation is present without focal consolidation. There is no nodule or mass lesion. No pneumothorax is present. The thoracic inlet is normal. There is mild prominence pericardial fluid. Review of the MIP images confirms  the above findings CTA HEAD FINDINGS Anterior circulation: Minimal atherosclerotic changes are present within the cavernous left internal carotid artery. There is no significant stenosis of either carotid artery through the ICA terminus. The A1 and M1 segments are normal. The anterior communicating artery is patent. There is no significant proximal stenosis or occlusion. There is some asymmetric vascularity over the posterior left frontal and parietal lobe without cortical gray matter differentiation loss. Posterior circulation: The right vertebral artery is the dominant vessel. PICA origins are visualized and normal. Vertebrobasilar junction is normal. The basilar artery is unremarkable. Both posterior cerebral arteries originate from the basilar tip. Mild irregularity is present in the posterior cerebral arteries without a significant stenosis. Venous sinuses: The dural sinuses are patent. Straight sinus deep cerebral veins are intact. Right transverse sinus is dominant. Cortical veins are unremarkable. Anatomic variants: None Review of the MIP images confirms the above findings IMPRESSION: 1. No emergent large vessel occlusion. 2. Asymmetric prominence of MCA branch vessels over the left posterior frontal and parietal lobes. This may represent luxury perfusion following TIA or infarct. Gray-white differentiation is preserved without evidence of a significant cortical infarct. 3. Mild atherosclerotic changes at the aortic arch and right carotid bifurcation without significant stenosis. 4. Multilevel degenerative changes in the cervical spine as described. 5. Scattered ground-glass attenuation lung apices likely represents atelectasis or edema. Electronically Signed   By: San Morelle M.D.   On: 03/21/2019 11:05   Mr Brain Wo Contrast  Result Date: 03/21/2019 CLINICAL DATA:  Focal neuro deficit, > 6 hrs, stroke suspected. Confusion. Right-sided weakness. EXAM: MRI HEAD WITHOUT CONTRAST TECHNIQUE:  Multiplanar, multiecho pulse sequences of the brain and surrounding structures were obtained without intravenous contrast. COMPARISON:  CT head without contrast 03/20/2019. CTA of the head and neck 03/21/2019. FINDINGS: Brain: The diffusion-weighted images demonstrate acute nonhemorrhagic infarct involving the left precentral gyrus measuring 1.5 cm cephalo caudad. A punctate area of restricted cortical diffusion is also present more posteriorly in the left parietal lobe. T2 signal changes are associated with the area of acute infarction. T2 signal changes are associated with the area of acute infarction. There are extensive periventricular and subcortical T2 changes bilaterally consistent with diffuse remote ischemia. Dilated perivascular spaces are present within the basal ganglia. Brainstem is normal. A single punctate remote lacunar infarct is present in the right cerebellum. The internal auditory canals are within normal limits. Vascular: Flow is present in the major intracranial arteries. Skull and upper cervical spine: Grade 1 anterolisthesis is again noted at C2-3 and C3-4. Advanced degenerative changes are noted at C4-5. Craniocervical junction is otherwise normal. Midline structures are normal. Sinuses/Orbits: The paranasal sinuses and mastoid air cells are clear. The globes and orbits are within normal limits. IMPRESSION: 1. 15 mm area of acute/subacute nonhemorrhagic infarct involving the left precentral gyrus, consistent with the patient's right upper extremity weakness. 2. Additional punctate area of acute/subacute nonhemorrhagic infarct involving the left parietal lobe. 3. Diffuse advanced periventricular and subcortical white matter changes  bilaterally reflect the sequela of chronic microvascular ischemia and multiple remote white matter infarcts. 4. Degenerative changes of the cervical spine. Electronically Signed   By: San Morelle M.D.   On: 03/21/2019 12:48   Ct Head Code Stroke Wo  Contrast  Result Date: 03/20/2019 CLINICAL DATA:  Code stroke. Focal neuro deficit, > 6 hrs, stroke suspected. Right-sided weakness and facial droop EXAM: CT HEAD WITHOUT CONTRAST TECHNIQUE: Contiguous axial images were obtained from the base of the skull through the vertex without intravenous contrast. COMPARISON:  CT head without contrast 08/01/2011 FINDINGS: Brain: Extensive periventricular and subcortical white matter hypoattenuation has progressed since the prior exam. Basal ganglia are intact the insular cortex is intact. Gray-white differentiation is preserved. No focal cortical infarct is present. Brainstem and cerebellum are normal. The ventricles are of normal size. No significant extraaxial fluid collection is present. Vascular: Atherosclerotic changes are present within the cavernous internal carotid arteries. There is no hyperdense vessel. Skull: Calvarium is intact. No focal lytic or blastic lesions are present. Sinuses/Orbits: The paranasal sinuses and mastoid air cells are clear. The globes and orbits are within normal limits. ASPECTS [facility] Stroke Program Early CT Score) - Ganglionic level infarction (caudate, lentiform nuclei, internal capsule, insula, M1-M3 cortex): 7/7 - Supraganglionic infarction (M4-M6 cortex): 3/3 Total score (0-10 with 10 being normal): 10/10 IMPRESSION: 1. No acute cortical or basal ganglia infarct. 2. Progression of diffuse white matter disease. Acute or subacute white matter infarct is not excluded. 3. Atherosclerosis 4. ASPECTS is 10/10 These results were called by telephone at the time of interpretation on 03/20/2019 at 2:30 pm to Dr. Verneda Skill, who verbally acknowledged these results. Electronically Signed   By: San Morelle M.D.   On: 03/20/2019 14:31        Scheduled Meds:  aspirin  81 mg Oral Q breakfast   atorvastatin  40 mg Oral q1800   clopidogrel  75 mg Oral Daily   enoxaparin (LOVENOX) injection  40 mg Subcutaneous Q24H    escitalopram  20 mg Oral Daily   gabapentin  300 mg Oral QHS   insulin aspart  0-15 Units Subcutaneous TID WC   insulin aspart  0-5 Units Subcutaneous QHS   insulin glargine  15 Units Subcutaneous QHS   letrozole  2.5 mg Oral Daily   risperiDONE  1 mg Oral QHS   traZODone  50 mg Oral QHS   venlafaxine XR  150 mg Oral Q breakfast   Continuous Infusions:   LOS: 2 days    Time spent: 25 minutes    Barb Merino, MD [facility] Pager [phone]  If 7PM-7AM, please contact night-coverage [email or web address] Password [facility] 03/22/2019, 10:42 AM

## 2019-03-22 NOTE — Progress Notes (Signed)
STROKE TEAM PROGRESS NOTE   INTERVAL HISTORY Her RN is at the bedside.  Pt neuro stable. PT/OT recommend CIR, however, pt has difficulty to find 24/7 supervision after CIR. She is frustrated, will have SW involved. Cardiology on board for cardiomyopathy. Pending TEE and loop in am.   Vitals:   03/21/19 2353 03/22/19 0309 03/22/19 0833 03/22/19 1200  BP: (!) 119/53 (!) 115/59 124/73 (!) 143/78  Pulse: 92 89 89 88  Resp: 18 17 17 20   Temp: 98.6 F (37 C) 98.6 F (37 C) 97.6 F (36.4 C) 97.9 F (36.6 C)  TempSrc:   Oral Oral  SpO2: 95% 96% 99% 96%  Weight:      Height:        CBC:  Recent Labs  Lab 03/20/19 1348 03/20/19 1407  WBC 10.6*  --   NEUTROABS 8.6*  --   HGB 14.3 14.3  HCT 43.9 42.0  MCV 91.6  --   PLT 226  --     Basic Metabolic Panel:  Recent Labs  Lab 03/20/19 1348 03/20/19 1407  NA 136 135  K 4.2 4.2  CL 101 102  CO2 25  --   GLUCOSE 115* 113*  BUN 19 19  CREATININE 0.72 0.80  CALCIUM 8.9  --    Lipid Panel:     Component Value Date/Time   CHOL 264 (H) 03/21/2019 0647   TRIG 174 (H) 03/21/2019 0647   HDL 76 03/21/2019 0647   CHOLHDL 3.5 03/21/2019 0647   VLDL 35 03/21/2019 0647   LDLCALC 153 (H) 03/21/2019 0647   HgbA1c:  Lab Results  Component Value Date   HGBA1C 6.3 (H) 03/21/2019   Urine Drug Screen:     Component Value Date/Time   LABOPIA NONE DETECTED 03/20/2019 1348   COCAINSCRNUR NONE DETECTED 03/20/2019 1348   LABBENZ NONE DETECTED 03/20/2019 1348   AMPHETMU NONE DETECTED 03/20/2019 1348   THCU NONE DETECTED 03/20/2019 1348   LABBARB NONE DETECTED 03/20/2019 1348    Alcohol Level     Component Value Date/Time   ETH <10 03/20/2019 1403    IMAGING Ct Angio Head W Or Wo Contrast  Result Date: 03/21/2019 CLINICAL DATA:  Stroke, follow-up. EXAM: CT ANGIOGRAPHY HEAD AND NECK TECHNIQUE: Multidetector CT imaging of the head and neck was performed using the standard protocol during bolus administration of intravenous contrast.  Multiplanar CT image reconstructions and MIPs were obtained to evaluate the vascular anatomy. Carotid stenosis measurements (when applicable) are obtained utilizing NASCET criteria, using the distal internal carotid diameter as the denominator. CONTRAST:  104mL OMNIPAQUE IOHEXOL 350 MG/ML SOLN COMPARISON:  CT head without contrast 03/20/2019. FINDINGS: CTA NECK FINDINGS Aortic arch: Atherosclerotic calcifications are present at the origin of the left subclavian artery without a significant stenosis. Additional calcifications are present in the distal aortic arch and descending thoracic aorta without aneurysm. Great vessel origins are otherwise within normal limits. Right carotid system: The right common carotid artery is within normal limits. Calcifications are present at the right carotid bifurcation without a significant stenosis. There is moderate tortuosity in the proximal cervical right ICA without a significant stenosis. Left carotid system: The left common carotid artery is within normal limits. Bifurcation is unremarkable. There is moderate tortuosity of the cervical left ICA without a significant stenosis. Vertebral arteries: The vertebral arteries originate from the subclavian arteries bilaterally. The right vertebral artery is dominant. There is no significant stenosis of either vertebral artery in the neck. Skeleton: Levoconvex curvature of the cervical  spine is centered at C4. Grade 1 anterolisthesis is present at C3-4. There is chronic loss of disc height and endplate change at D34-534, C5-6, and C6-7. No focal lytic or blastic lesions are present. Other neck: No focal mucosal or submucosal lesions are present. Nasopharynx is unremarkable. Soft palate and tongue base are normal. Salivary glands are within normal limits. No significant adenopathy is present. Vocal cords are midline and symmetric. Thyroid is normal. Upper chest: Scattered ground-glass attenuation is present without focal consolidation. There  is no nodule or mass lesion. No pneumothorax is present. The thoracic inlet is normal. There is mild prominence pericardial fluid. Review of the MIP images confirms the above findings CTA HEAD FINDINGS Anterior circulation: Minimal atherosclerotic changes are present within the cavernous left internal carotid artery. There is no significant stenosis of either carotid artery through the ICA terminus. The A1 and M1 segments are normal. The anterior communicating artery is patent. There is no significant proximal stenosis or occlusion. There is some asymmetric vascularity over the posterior left frontal and parietal lobe without cortical gray matter differentiation loss. Posterior circulation: The right vertebral artery is the dominant vessel. PICA origins are visualized and normal. Vertebrobasilar junction is normal. The basilar artery is unremarkable. Both posterior cerebral arteries originate from the basilar tip. Mild irregularity is present in the posterior cerebral arteries without a significant stenosis. Venous sinuses: The dural sinuses are patent. Straight sinus deep cerebral veins are intact. Right transverse sinus is dominant. Cortical veins are unremarkable. Anatomic variants: None Review of the MIP images confirms the above findings IMPRESSION: 1. No emergent large vessel occlusion. 2. Asymmetric prominence of MCA branch vessels over the left posterior frontal and parietal lobes. This may represent luxury perfusion following TIA or infarct. Gray-white differentiation is preserved without evidence of a significant cortical infarct. 3. Mild atherosclerotic changes at the aortic arch and right carotid bifurcation without significant stenosis. 4. Multilevel degenerative changes in the cervical spine as described. 5. Scattered ground-glass attenuation lung apices likely represents atelectasis or edema. Electronically Signed   By: San Morelle M.D.   On: 03/21/2019 11:05   Ct Angio Neck W Or Wo  Contrast  Result Date: 03/21/2019 CLINICAL DATA:  Stroke, follow-up. EXAM: CT ANGIOGRAPHY HEAD AND NECK TECHNIQUE: Multidetector CT imaging of the head and neck was performed using the standard protocol during bolus administration of intravenous contrast. Multiplanar CT image reconstructions and MIPs were obtained to evaluate the vascular anatomy. Carotid stenosis measurements (when applicable) are obtained utilizing NASCET criteria, using the distal internal carotid diameter as the denominator. CONTRAST:  61mL OMNIPAQUE IOHEXOL 350 MG/ML SOLN COMPARISON:  CT head without contrast 03/20/2019. FINDINGS: CTA NECK FINDINGS Aortic arch: Atherosclerotic calcifications are present at the origin of the left subclavian artery without a significant stenosis. Additional calcifications are present in the distal aortic arch and descending thoracic aorta without aneurysm. Great vessel origins are otherwise within normal limits. Right carotid system: The right common carotid artery is within normal limits. Calcifications are present at the right carotid bifurcation without a significant stenosis. There is moderate tortuosity in the proximal cervical right ICA without a significant stenosis. Left carotid system: The left common carotid artery is within normal limits. Bifurcation is unremarkable. There is moderate tortuosity of the cervical left ICA without a significant stenosis. Vertebral arteries: The vertebral arteries originate from the subclavian arteries bilaterally. The right vertebral artery is dominant. There is no significant stenosis of either vertebral artery in the neck. Skeleton: Levoconvex curvature of the cervical  spine is centered at C4. Grade 1 anterolisthesis is present at C3-4. There is chronic loss of disc height and endplate change at D34-534, C5-6, and C6-7. No focal lytic or blastic lesions are present. Other neck: No focal mucosal or submucosal lesions are present. Nasopharynx is unremarkable. Soft palate  and tongue base are normal. Salivary glands are within normal limits. No significant adenopathy is present. Vocal cords are midline and symmetric. Thyroid is normal. Upper chest: Scattered ground-glass attenuation is present without focal consolidation. There is no nodule or mass lesion. No pneumothorax is present. The thoracic inlet is normal. There is mild prominence pericardial fluid. Review of the MIP images confirms the above findings CTA HEAD FINDINGS Anterior circulation: Minimal atherosclerotic changes are present within the cavernous left internal carotid artery. There is no significant stenosis of either carotid artery through the ICA terminus. The A1 and M1 segments are normal. The anterior communicating artery is patent. There is no significant proximal stenosis or occlusion. There is some asymmetric vascularity over the posterior left frontal and parietal lobe without cortical gray matter differentiation loss. Posterior circulation: The right vertebral artery is the dominant vessel. PICA origins are visualized and normal. Vertebrobasilar junction is normal. The basilar artery is unremarkable. Both posterior cerebral arteries originate from the basilar tip. Mild irregularity is present in the posterior cerebral arteries without a significant stenosis. Venous sinuses: The dural sinuses are patent. Straight sinus deep cerebral veins are intact. Right transverse sinus is dominant. Cortical veins are unremarkable. Anatomic variants: None Review of the MIP images confirms the above findings IMPRESSION: 1. No emergent large vessel occlusion. 2. Asymmetric prominence of MCA branch vessels over the left posterior frontal and parietal lobes. This may represent luxury perfusion following TIA or infarct. Gray-white differentiation is preserved without evidence of a significant cortical infarct. 3. Mild atherosclerotic changes at the aortic arch and right carotid bifurcation without significant stenosis. 4.  Multilevel degenerative changes in the cervical spine as described. 5. Scattered ground-glass attenuation lung apices likely represents atelectasis or edema. Electronically Signed   By: San Morelle M.D.   On: 03/21/2019 11:05   Mr Brain Wo Contrast  Result Date: 03/21/2019 CLINICAL DATA:  Focal neuro deficit, > 6 hrs, stroke suspected. Confusion. Right-sided weakness. EXAM: MRI HEAD WITHOUT CONTRAST TECHNIQUE: Multiplanar, multiecho pulse sequences of the brain and surrounding structures were obtained without intravenous contrast. COMPARISON:  CT head without contrast 03/20/2019. CTA of the head and neck 03/21/2019. FINDINGS: Brain: The diffusion-weighted images demonstrate acute nonhemorrhagic infarct involving the left precentral gyrus measuring 1.5 cm cephalo caudad. A punctate area of restricted cortical diffusion is also present more posteriorly in the left parietal lobe. T2 signal changes are associated with the area of acute infarction. T2 signal changes are associated with the area of acute infarction. There are extensive periventricular and subcortical T2 changes bilaterally consistent with diffuse remote ischemia. Dilated perivascular spaces are present within the basal ganglia. Brainstem is normal. A single punctate remote lacunar infarct is present in the right cerebellum. The internal auditory canals are within normal limits. Vascular: Flow is present in the major intracranial arteries. Skull and upper cervical spine: Grade 1 anterolisthesis is again noted at C2-3 and C3-4. Advanced degenerative changes are noted at C4-5. Craniocervical junction is otherwise normal. Midline structures are normal. Sinuses/Orbits: The paranasal sinuses and mastoid air cells are clear. The globes and orbits are within normal limits. IMPRESSION: 1. 15 mm area of acute/subacute nonhemorrhagic infarct involving the left precentral gyrus, consistent with the  patient's right upper extremity weakness. 2. Additional  punctate area of acute/subacute nonhemorrhagic infarct involving the left parietal lobe. 3. Diffuse advanced periventricular and subcortical white matter changes bilaterally reflect the sequela of chronic microvascular ischemia and multiple remote white matter infarcts. 4. Degenerative changes of the cervical spine. Electronically Signed   By: San Morelle M.D.   On: 03/21/2019 12:48   Ct Head Code Stroke Wo Contrast  Result Date: 03/20/2019 CLINICAL DATA:  Code stroke. Focal neuro deficit, > 6 hrs, stroke suspected. Right-sided weakness and facial droop EXAM: CT HEAD WITHOUT CONTRAST TECHNIQUE: Contiguous axial images were obtained from the base of the skull through the vertex without intravenous contrast. COMPARISON:  CT head without contrast 08/01/2011 FINDINGS: Brain: Extensive periventricular and subcortical white matter hypoattenuation has progressed since the prior exam. Basal ganglia are intact the insular cortex is intact. Gray-white differentiation is preserved. No focal cortical infarct is present. Brainstem and cerebellum are normal. The ventricles are of normal size. No significant extraaxial fluid collection is present. Vascular: Atherosclerotic changes are present within the cavernous internal carotid arteries. There is no hyperdense vessel. Skull: Calvarium is intact. No focal lytic or blastic lesions are present. Sinuses/Orbits: The paranasal sinuses and mastoid air cells are clear. The globes and orbits are within normal limits. ASPECTS [facility] Stroke Program Early CT Score) - Ganglionic level infarction (caudate, lentiform nuclei, internal capsule, insula, M1-M3 cortex): 7/7 - Supraganglionic infarction (M4-M6 cortex): 3/3 Total score (0-10 with 10 being normal): 10/10 IMPRESSION: 1. No acute cortical or basal ganglia infarct. 2. Progression of diffuse white matter disease. Acute or subacute white matter infarct is not excluded. 3. Atherosclerosis 4. ASPECTS is 10/10 These results  were called by telephone at the time of interpretation on 03/20/2019 at 2:30 pm to Dr. Verneda Skill, who verbally acknowledged these results. Electronically Signed   By: San Morelle M.D.   On: 03/20/2019 14:31    PHYSICAL EXAM   General - Well nourished, well developed, in no apparent distress.  Ophthalmologic - fundi not visualized due to noncooperation.  Cardiovascular - Regular rate and rhythm.  Mental Status -  Level of arousal and orientation to self, age, place, and person were intact, but not orientated to time. Language including expression, naming, repetition, comprehension was assessed and found intact.  Cranial Nerves II - XII - II - Visual field intact OU. III, IV, VI - Extraocular movements intact. V - Facial sensation intact bilaterally. VII - Facial movement intact bilaterally. VIII - Hearing & vestibular intact bilaterally. X - Palate elevates symmetrically. XI - Chin turning & shoulder shrug intact bilaterally. XII - Tongue protrusion intact.  Motor Strength - The patient's strength was normal in LUE however RUE 3/5 proximal and 4/5 distal. BLE proximal 4/5 and LLE distal 4-/5.  Bulk was normal and fasciculations were absent.   Motor Tone - Muscle tone was assessed at the neck and appendages and was normal.  Reflexes - The patient's reflexes were symmetrical in all extremities and she had no pathological reflexes.  Sensory - Light touch, temperature/pinprick were assessed and were decreased on the RLE.    Coordination - The patient had normal movements in left hand with no ataxia or dysmetria.  Tremor was absent.  Gait and Station - deferred.   ASSESSMENT/PLAN Amy [name] is a 83 y.o. female with history of hyperlidemia, DM, Rt breast cancer stage 1a s/p surgery and on chemo with Femara, anxiety, depression, and osteopenia presenting to [facility] ED after being  found down with R sided weakness. Transferred to [facility] for further  evaluation.  Stroke - small infarcts at left precentral gyrus and left superior parietal lobe, embolic pattern, source unclear  CT head - No acute cortical or basal ganglia infarct. Atherosclerosis.  MRI head - small infarcts at left precentral gyrus and left superior parietal lobe,  CTA H&N - unremarkable  2D Echo - EF 35-40% with diffuse hypokinesis  Card doing TEE given low EF  For loop recorder to rule out afib  [employer] Virus 2 - negative  LDL - 153  HgbA1c 6.3  UDS - negative  Lovenox 40 mg sq daily  - SCDs  aspirin 325 mg daily prior to admission, now on ASA 81 and plavix 75mg  DAPT. Continue DAPT for 3 weeks and then plavix alone  Therapy recommendations:  CIR  Disposition:  Pending  Cardiomyopathy   EF 35-40% with diffuse hypokinesis  No hx of CHF  Cardiology plans TEE to further evaluate  As per card, pt will need outpt cath once stabilized  Breast cancer on Femara  Stage Ia in 2018  S/p lumpectomy  On Femara  There is about 2-3% stroke risk in pt taking Femara. Recommend pt to discuss with oncology to see if continue medication would be appropriate. Currently, do not think this is a big concern for stroke as pt has alternative cause to explain the stroke.  Hypertension  Stable  Permissive hypertension (OK if < 220/120) but gradually normalize in 5-7 days  Long-term BP goal normotensive  Hyperlipidemia  Lipid lowering medication PTA:  none  LDL 153, goal < 70  Current lipid lowering medication: lipitor 40  Continue statin at discharge  Diabetes  HgbA1c 6.3, goal < 7.0  Controlled  SSI  CBG monitoring  PCP follow up  Other Stroke Risk Factors  Advanced age  Former cigarette smoker - quit  Other Active Problems  Mild leukocytosis - 10.6 (afebrile)  Hospital day # 2  Amy Hawking, MD PhD Stroke Neurology 03/22/2019 6:32 PM    To contact Stroke Continuity provider, please refer to [email or web address] After hours,  contact General Neurology

## 2019-03-22 NOTE — Progress Notes (Signed)
Physical Therapy Treatment Patient Details Name: Amy [name] MRN: [id number] DOB: Apr 28, 1936 Today's Date: 03/22/2019    History of Present Illness 83 y.o. female with a history of diabetes, anxiety and depression, invasive ductal carcinoma of the right breast on Femara, arthritis.  Patient seen for right arm weakness and unwitnessed fall. CT negative, MRI head - small infarcts at left precentral gyrus and left superior parietal lobe.     PT Comments    Pt had a busy morning of tests and bathing and did not feel up for doing much during our therapy session reporting she was too "hot".  Air turned down and cold cloth offered, but pt did not do more than bed mobility and sitting balance for me.  Daughter was in room and did not offer encouragement to participate.  Pt reported she would do more "on the 4th floor".  I encouraged her that we have to work up to that three hours of therapy up there and she adamantly refused more therapy and returned to supine.  PT will continue to follow acutely and, hopefully, when she has had a less busy AM, she will participate more.  PT will continue to follow acutely for safe mobility progression  Follow Up Recommendations  CIR     Equipment Recommendations  None recommended by PT    Recommendations for Other Services   NA     Precautions / Restrictions Precautions Precautions: Fall Precaution Comments: R sided weakness, R/posterior lean    Mobility  Bed Mobility Overal bed mobility: Needs Assistance Bed Mobility: Rolling;Sit to Supine Rolling: Min assist     Sit to supine: Mod assist   General bed mobility comments: Min assist to roll bil for pad change and barrier cream placement as her bottom is read (RN made aware, pt positioned on her side at the end of the session).  Mod assist to go from sitting to supine as she needed help lifting both legs back into bed. Hand over hand assist to place right hand on bed for bed mobility.   Transfers                 General transfer comment: Pt refused, reporting she was hot and tired.  I tried to encourage her that she needed to work towards the three hours per day on inpatient rehab and she said she would start when she got up there.  She was adamant about not attempting to stand and going back to supine.  Daughter reports she has had a busy morning of doctors, bathing, and tests (all in the bed) and did not encourage her mom to participate      Modified Rankin (Stroke Patients Only) Modified Rankin (Stroke Patients Only) Pre-Morbid Rankin Score: No symptoms Modified Rankin: Moderately severe disability     Balance Overall balance assessment: Needs assistance Sitting-balance support: Feet supported;Bilateral upper extremity supported Sitting balance-Leahy Scale: Poor Sitting balance - Comments: min to mod assist EOB due to posterior R lateral lean/LOB.  Pt holding the bedrail on her left and is able to report the direction of her LOB, but unable to correct with her trunk when hand removed from rail.  Pt often forgetful of her right arm position on bed (flexed under WB on turned wrist) despite having intact sensation.   Postural control: Posterior lean;Right lateral lean  Cognition Arousal/Alertness: Awake/alert Behavior During Therapy: WFL for tasks assessed/performed Overall Cognitive Status: Impaired/Different from baseline Area of Impairment: Attention;Memory;Following commands;Safety/judgement;Awareness;Problem solving                   Current Attention Level: Sustained Memory: Decreased short-term memory(does not remember working with PT/OT this weekend) Following Commands: Follows one step commands consistently;Follows one step commands with increased time Safety/Judgement: Decreased awareness of safety;Decreased awareness of deficits Awareness: Emergent Problem Solving: Slow processing;Difficulty  sequencing;Requires verbal cues;Requires tactile cues General Comments: Pt oriented to time, person and situation, however, she does not know how her fall has affected her vs her stroke (she implies that her R arm doesn't work because she fell vs it is weak from the stroke).  She also does not relate working with theapy with getting stronger stating "I will work when I get to the 4th floor"             Pertinent Vitals/Pain Pain Assessment: Faces Faces Pain Scale: Hurts little more Pain Location: chronic back pain Pain Descriptors / Indicators: Discomfort;Grimacing;Guarding Pain Intervention(s): Limited activity within patient's tolerance;Monitored during session;Repositioned        PT Goals (current goals can now be found in the care plan section) Acute Rehab PT Goals Patient Stated Goal: pt wants to rest, she is hot Progress towards PT goals: Not progressing toward goals - comment(pt self limiting today)    Frequency    Min 4X/week      PT Plan Current plan remains appropriate       AM-PAC PT "6 Clicks" Mobility   Outcome Measure  Help needed turning from your back to your side while in a flat bed without using bedrails?: A Little Help needed moving from lying on your back to sitting on the side of a flat bed without using bedrails?: A Lot Help needed moving to and from a bed to a chair (including a wheelchair)?: A Lot Help needed standing up from a chair using your arms (e.g., wheelchair or bedside chair)?: A Lot Help needed to walk in hospital room?: A Lot Help needed climbing 3-5 steps with a railing? : Total 6 Click Score: 12    End of Session   Activity Tolerance: Other (comment)(pt self limiting, reporting being "hot" ) Patient left: in bed;with call bell/phone within reach;with family/visitor present Nurse Communication: Other (comment)(red bottom) PT Visit Diagnosis: Difficulty in walking, not elsewhere classified (R26.2);Other symptoms and signs involving  the nervous system [id number])     Time: [id number] PT Time Calculation (min) (ACUTE ONLY): 25 min  Charges:  $Therapeutic Activity: 23-37 mins                    Zaila Crew B. Gilda Abboud, PT, DPT  Acute Rehabilitation [phone] pager [phone] office  @ [facility]: [phone]   03/22/2019, 1:03 PM

## 2019-03-22 NOTE — Progress Notes (Signed)
PT Cancellation Note  Patient Details Name: Amy [name] MRN: [id number] DOB: 1935/09/06   Cancelled Treatment:    Reason Eval/Treat Not Completed: Patient at procedure or test/unavailable.  Looks like EKG tech in room.  PT will attempt back later today as time allows.  Thanks,  Barbarann Ehlers. Siboney Requejo, PT, DPT  Acute Rehabilitation [phone] pager [phone] office  @ [facility]: [phone]     Harvie Heck 03/22/2019, 11:52 AM

## 2019-03-22 NOTE — Progress Notes (Signed)
Inpatient Rehab Admissions:  Inpatient Rehab Consult received.  I met with pt and her daughter at the bedside for rehabilitation assessment. While both seem interested in the program, the pt and family have concerns regarding DC support. AC stressed that pt will need physical assist at DC based on current functional level documented and we will need to have a DC plan confirmed prior to acceptance to CIR. Family plans to get back to me by noon tomorrow to see if they can have family support in place to support IP Rehab stay.   Will follow up tomorrow for decision.   Jhonnie Garner, OTR/L  Rehab Admissions Coordinator  [phone] 03/22/2019 2:08 PM

## 2019-03-22 NOTE — Evaluation (Signed)
Speech Language Pathology Evaluation Patient Details Name: Amy [name] MRN: [id number] DOB: 1935/12/21 Today's Date: 03/22/2019 Time: [id number] SLP Time Calculation (min) (ACUTE ONLY): 17 min  Problem List:  Patient Active Problem List   Diagnosis Date Noted  . Ischemic stroke ([place]) 03/20/2019  . Edema of both lower legs due to peripheral venous insufficiency 10/12/2018  . Anemia 05/12/2018  . S/P total knee replacement, right 05/05/18 05/12/2018  . Type 2 diabetes mellitus with neurological complications ([place]) [id number]  . Primary osteoarthritis of right knee   . Status post surgery 01/26/2018  . Surgery, elective 01/26/2018  . Degenerative spondylolisthesis 01/26/2018  . Osteoporosis 12/13/2016  . Osteopenia 11/26/2016  . Invasive ductal carcinoma of breast, female, right ([place])   . Lumbago 03/22/2014  . Stiffness of joint, not elsewhere classified, pelvic region and thigh 03/22/2014  . Muscle weakness (generalized) 03/22/2014  . Abnormality of gait 03/22/2014  . Depression with anxiety 09/03/2012  . Insomnia secondary to depression with anxiety 09/03/2012  . Pain 09/03/2012   Past Medical History:  Past Medical History:  Diagnosis Date  . Anxiety   . Arthritis   . Back pain   . Colon polyps   . Depression   . Diabetes mellitus, type II ([place])    diet controlled  . Hypercholesterolemia   . Invasive ductal carcinoma of breast, female, right ([place])   . Osteopenia 11/26/2016  . Skin-picking disorder    has 3 open wounds on right wrist that are being treated. About a quarter in size.   Past Surgical History:  Past Surgical History:  Procedure Laterality Date  . APPENDECTOMY    . BREAST BIOPSY Right 10/09/2016   Procedure: BREAST BIOPSY WITH NEEDLE LOCALIZATION;  Surgeon: [name], MD;  Location: [facility];  Service: General;  Laterality: Right;  . BREAST SURGERY Right   . BUNIONECTOMY Bilateral   . FACIAL COSMETIC SURGERY     drooping eyelid and brow  . HERNIA  REPAIR Right    Inguinal x2  . KNEE ARTHROSCOPY Right   . SHOULDER SURGERY Right    rotator cuff repair and removal of bone spur  . TEMPOROMANDIBULAR JOINT SURGERY Right   . TOTAL KNEE ARTHROPLASTY Right 05/05/2018   Procedure: TOTAL KNEE ARTHROPLASTY;  Surgeon: [name], MD;  Location: [facility];  Service: Orthopedics;  Laterality: Right;   HPI:  Illness45 y.o. female with a history of diabetes, anxiety and depression, invasive ductal carcinoma of the right breast on Femara, arthritis.  Patient seen for right arm weakness and unwitnessed fall. CT negative, MRI head - small infarcts at left precentral gyrus and left superior parietal lobe   Assessment / Plan / Recommendation Clinical Impression  Pt presents with and reports acute cognitive deficits post CVA. Cognitive deficits c/b reduced thought organization, decreased recall, reduced executive function skills, and decreased problem solving. Pt states hx of anxiety which likely exacerbates current cognitive deficits. Motor speech skills appeared intact. Pt with intermittent pausing and hesitations during communication interactions, some mild word finding difficulty cannot be excluded vs slowed processing.  PLOF she reports living at home with a hired caregiver M-F 4 hours and help from son and daughter for complex ADLs including financial management. Continued ST needs indicated during acute stay and at next level of care to assist cognitive recovery.     SLP Assessment  SLP Recommendation/Assessment: Patient needs continued Speech Lanaguage Pathology Services SLP Visit Diagnosis: Cognitive communication deficit (R41.841)    Follow Up Recommendations  Frequency and Duration min 1 x/week  1 week      SLP Evaluation Cognition  Overall Cognitive Status: Impaired/Different from baseline Arousal/Alertness: Awake/alert Orientation Level: Oriented to person;Oriented to place;Disoriented to time Attention:  Focused;Sustained Focused Attention: Impaired Sustained Attention: Impaired Memory: Impaired Memory Impairment: Decreased short term memory;Decreased recall of new information Awareness: Impaired Problem Solving: Impaired Executive Function: Organizing;Self Monitoring Self Monitoring: Impaired Safety/Judgment: Impaired       Comprehension  Auditory Comprehension Overall Auditory Comprehension: Appears within functional limits for tasks assessed    Expression Expression Primary Mode of Expression: Verbal Verbal Expression Overall Verbal Expression: Impaired Other Verbal Expression Comments: slowed processing, hesistations Written Expression Dominant Hand: Right   Oral / Motor  Oral Motor/Sensory Function Overall Oral Motor/Sensory Function: Within functional limits Motor Speech Overall Motor Speech: Appears within functional limits for tasks assessed   GO                    Makaley Storts E Jaymz Traywick MA, CCC-SLP Acute Rehabilitation Services  03/22/2019, 3:50 PM

## 2019-03-22 NOTE — H&P (View-Only) (Signed)
Advanced Heart Failure Team Consult Note   Primary Physician: Sinda Du, MD PCP-Cardiologist:  No primary care provider on file.  Reason for Consultation: Heart Failure   HPI:    Amy [name] is seen today for evaluation of heart failure  at the request of Dr Sloan Leiter.   Amy [name] is an 83 year old with history of DM, anxiety, depression, R breast cancer , S/P right lumpectomy on femara, and arthritis. No history of coronary disease.   Presented to [facility] after being found on the floor by her son with R sided weakness. She had some R sided weakness the night before while dressing. She did not receive TPA. Transferred to [facility] for stroke work up.   Brain MRI showed  1. 15 mm area of acute/subacute nonhemorrhagic infarct involving the left precentral gyrus, consistent with the patient's right upper extremity weakness. 2. Additional punctate area of acute/subacute nonhemorrhagic infarct involving the left parietal lobe.  CTA showed mild atherosclerotic plaquing in aortic arch and R carotid.   Neuro felt may be cardio-embolic.   Placed on aspirin and plavix. ECHO was completed and showed reduced EF 35-40% (Dr. Haroldine Laws felt 40-45% with diffuse HK) and normal RV. PT following and recommending CIR.   Denies chest pain. Denies shortness of breath. At baseline has not been very functional due to recent back surgery and other ortho issues.   Denies h/o AF. Snores frequently.    Review of Systems: [y] = yes, [ ]  = no    General: Weight gain [ ] ; Weight loss [ ] ; Anorexia [ ] ; Fatigue Blue.Reese ]; Fever [ ] ; Chills [ ] ; Weakness [ Y]   Cardiac: Chest pain/pressure [ ] ; Resting SOB [ ] ; Exertional SOB [ ] ; Orthopnea [ ] ; Pedal Edema [ ] ; Palpitations [ ] ; Syncope [ ] ; Presyncope [ ] ; Paroxysmal nocturnal dyspnea[ ]    Pulmonary: Cough [ ] ; Wheezing[ ] ; Hemoptysis[ ] ; Sputum [ ] ; Snoring Blue.Reese ]   GI: Vomiting[ ] ; Dysphagia[ ] ; Melena[ ] ; Hematochezia [ ] ; Heartburn[ ] ; Abdominal pain  [ ] ; Constipation [ ] ; Diarrhea [ ] ; BRBPR [ ]    GU: Hematuria[ ] ; Dysuria [ ] ; Nocturia[ ]    Vascular: Pain in legs with walking [ ] ; Pain in feet with lying flat [ ] ; Non-healing sores [ ] ; Stroke [ ] ; TIA [ ] ; Slurred speech [ ] ;   Neuro: Headaches[ ] ; Vertigo[ ] ; Seizures[ ] ; Paresthesias[ ] ;Blurred vision [ ] ; Diplopia [ ] ; Vision changes [ ]    Ortho/Skin: Arthritis [Y ]; Joint pain [ Y]; Muscle pain [ ] ; Joint swelling [ ] ; Back Pain [Y ]; Rash [ ]    Psych: Depression[ ] ; Anxiety[ ]    Heme: Bleeding problems [ ] ; Clotting disorders [ ] ; Anemia [ ]    Endocrine: Diabetes [Y ]; Thyroid dysfunction[ ]   Home Medications Prior to Admission medications   Medication Sig Start Date End Date Taking? Authorizing Provider  ALPRAZolam Duanne Moron) 0.5 MG tablet TAKE 1 TABLET(0.5 MG) BY MOUTH DAILY AS NEEDED FOR ANXIETY 01/05/19  Yes Cloria Spring, MD  calcium carbonate (OSCAL) 1500 (600 Ca) MG TABS tablet Take 600 mg of elemental calcium by mouth daily.   Yes [provider]  cholecalciferol (VITAMIN D) 1000 units tablet Take 1 tablet (1,000 Units total) by mouth daily. 10/23/16  Yes Kefalas, Manon Hilding, PA-C  escitalopram (LEXAPRO) 20 MG tablet Take 1 tablet (20 mg total) by mouth daily. 01/05/19  Yes Cloria Spring, MD  gabapentin (NEURONTIN) 300 MG  capsule Take 300 mg by mouth at bedtime.  03/27/18  Yes [provider]  ibuprofen (ADVIL,MOTRIN) 800 MG tablet Take 1 tablet (800 mg total) by mouth every 8 (eight) hours as needed. 07/01/18  Yes [name], MD  letrozole [facility]) 2.5 MG tablet TAKE 1 TABLET(2.5 MG) BY MOUTH DAILY 01/28/19  Yes Derek Jack, MD  metFORMIN (GLUCOPHAGE) 500 MG tablet Take 500 mg by mouth 2 (two) times daily with a meal.  06/30/16  Yes [provider]  risperiDONE (RISPERDAL) 1 MG tablet Take 1 tablet (1 mg total) by mouth at bedtime. 01/05/19 01/05/20 Yes Cloria Spring, MD  traZODone (DESYREL) 50 MG tablet TAKE 1 TABLET(50 MG) BY MOUTH AT  BEDTIME 01/05/19  Yes Cloria Spring, MD  venlafaxine XR (EFFEXOR-XR) 150 MG 24 hr capsule TAKE 1 CAPSULE(150 MG) BY MOUTH TWICE DAILY Patient taking differently: 150 mg daily with breakfast.  01/05/19  Yes Cloria Spring, MD  aspirin EC 325 MG EC tablet Take 1 tablet (325 mg total) by mouth daily with breakfast. Patient not taking: Reported on 12/08/2018 05/08/18   [name], MD  Glycerin-Hypromellose-PEG 400 (HM DRY EYE RELIEF) 0.2-0.2-1 % SOLN Apply 1 drop to eye daily as needed (dry eyes).    [provider]  ONE TOUCH ULTRA TEST test strip USE TO TEST BLOOD SUGAR ONCE DAILY 12/02/18   [provider]    Past Medical History: Past Medical History:  Diagnosis Date   Anxiety    Arthritis    Back pain    Colon polyps    Depression    Diabetes mellitus, type II ([place])    diet controlled   Hypercholesterolemia    Invasive ductal carcinoma of breast, female, right ([place])    Osteopenia 11/26/2016   Skin-picking disorder    has 3 open wounds on right wrist that are being treated. About a quarter in size.    Past Surgical History: Past Surgical History:  Procedure Laterality Date   APPENDECTOMY     BREAST BIOPSY Right 10/09/2016   Procedure: BREAST BIOPSY WITH NEEDLE LOCALIZATION;  Surgeon: [name], MD;  Location: [facility];  Service: General;  Laterality: Right;   BREAST SURGERY Right    BUNIONECTOMY Bilateral    FACIAL COSMETIC SURGERY     drooping eyelid and brow   HERNIA REPAIR Right    Inguinal x2   KNEE ARTHROSCOPY Right    SHOULDER SURGERY Right    rotator cuff repair and removal of bone spur   TEMPOROMANDIBULAR JOINT SURGERY Right    TOTAL KNEE ARTHROPLASTY Right 05/05/2018   Procedure: TOTAL KNEE ARTHROPLASTY;  Surgeon: [name], MD;  Location: [facility];  Service: Orthopedics;  Laterality: Right;    Family History: Family History  Problem Relation Age of Onset   Anxiety disorder Mother    Depression Mother     Depression Maternal Grandmother    Alcohol abuse Brother    Cancer - Other Sister    ADD / ADHD Neg Hx    Bipolar disorder Neg Hx    Dementia Neg Hx    Drug abuse Neg Hx    OCD Neg Hx    Paranoid behavior Neg Hx    Schizophrenia Neg Hx    Seizures Neg Hx    Sexual abuse Neg Hx    Physical abuse Neg Hx     Social History: Social History   Socioeconomic History   Marital status: Divorced    Spouse name:  Not on file   Number of children: Not on file   Years of education: Not on file   Highest education level: Not on file  Occupational History   Occupation: Nurse    Comment: Retired  Scientist, product/process development strain: Not hard at [employer] insecurity    Worry: Never true    Inability: Never true   Transportation needs    Medical: No    Non-medical: No  Tobacco Use   Smoking status: Former Smoker    Packs/day: 0.50    Years: 40.00    Pack years: 20.00    Types: Cigarettes    Quit date: 09/04/1995    Years since quitting: 23.5   Smokeless tobacco: Never Used  Substance and Sexual Activity   Alcohol use: No   Drug use: No    Types: Benzodiazepines, Hydrocodone   Sexual activity: Never  Lifestyle   Physical activity    Days per week: 0 days    Minutes per session: 0 min   Stress: Only a little  Relationships   Social connections    Talks on phone: Once a week    Gets together: Never    Attends religious service: More than 4 times per year    Active member of club or organization: No    Attends meetings of clubs or organizations: Never    Relationship status: Divorced  Other Topics Concern   Not on file  Social History Narrative   Not on file    Allergies:  Allergies  Allergen Reactions   Cymbalta [Duloxetine Hcl] Other (See Comments)    Hallucinated and couldn't think; got worse and worse the longer she took this.    Objective:    Vital Signs:   Temp:  [97.5 F (36.4 C)-98.6 F (37 C)] 97.6 F (36.4 C)  (08/24 0833) Pulse Rate:  [83-92] 89 (08/24 0833) Resp:  [17-18] 17 (08/24 0833) BP: (115-138)/(53-77) 124/73 (08/24 0833) SpO2:  [95 %-100 %] 99 % (08/24 0833)    Weight change: Filed Weights   03/20/19 1351 03/20/19 2105 03/20/19 2251  Weight: 109.7 kg 110 kg 77.4 kg    Intake/Output:   Intake/Output Summary (Last 24 hours) at 03/22/2019 1058 Last data filed at 03/22/2019 0024 Gross per 24 hour  Intake 240 ml  Output 850 ml  Net -610 ml      Physical Exam    General: Elderly.  No resp difficulty HEENT: normal Neck: supple. JVP flat  . Carotids 2+ bilat; no bruits. No lymphadenopathy or thyromegaly appreciated. Cor: PMI nondisplaced. Regular rate & rhythm. No rubs, gallops or murmurs. Lungs: clear Abdomen: soft, nontender, nondistended. No hepatosplenomegaly. No bruits or masses. Good bowel sounds. Extremities: no cyanosis, clubbing, rash, edema Neuro: alert & orientedx3, cranial nerves grossly intact. moves all 4 extremities. RUE weakness. Affect flat    Telemetry   NSR 90s Personally reviewed   EKG    SR 88 bpm occasional PVC No significnat ST-T abnormalities  personally reviewed.   Labs   Basic Metabolic Panel: Recent Labs  Lab 03/20/19 1348 03/20/19 1407  NA 136 135  K 4.2 4.2  CL 101 102  CO2 25  --   GLUCOSE 115* 113*  BUN 19 19  CREATININE 0.72 0.80  CALCIUM 8.9  --     Liver Function Tests: Recent Labs  Lab 03/20/19 1348  AST 19  ALT 12  ALKPHOS 66  BILITOT 0.6  PROT 6.9  ALBUMIN 3.9  No results for input(s): LIPASE, AMYLASE in the last 168 hours. No results for input(s): AMMONIA in the last 168 hours.  CBC: Recent Labs  Lab 03/20/19 1348 03/20/19 1407  WBC 10.6*  --   NEUTROABS 8.6*  --   HGB 14.3 14.3  HCT 43.9 42.0  MCV 91.6  --   PLT 226  --     Cardiac Enzymes: No results for input(s): CKTOTAL, CKMB, CKMBINDEX, TROPONINI in the last 168 hours.  BNP: BNP (last 3 results) No results for input(s): BNP in the last  8760 hours.  ProBNP (last 3 results) No results for input(s): PROBNP in the last 8760 hours.   CBG: Recent Labs  Lab 03/21/19 0601 03/21/19 1148 03/21/19 1653 03/21/19 2145 03/22/19 0623  GLUCAP 124* 260* 70 100* 151*    Coagulation Studies: Recent Labs    03/20/19 1348  LABPROT 12.9  INR 1.0     Imaging   Mr Brain Wo Contrast  Result Date: 03/21/2019 CLINICAL DATA:  Focal neuro deficit, > 6 hrs, stroke suspected. Confusion. Right-sided weakness. EXAM: MRI HEAD WITHOUT CONTRAST TECHNIQUE: Multiplanar, multiecho pulse sequences of the brain and surrounding structures were obtained without intravenous contrast. COMPARISON:  CT head without contrast 03/20/2019. CTA of the head and neck 03/21/2019. FINDINGS: Brain: The diffusion-weighted images demonstrate acute nonhemorrhagic infarct involving the left precentral gyrus measuring 1.5 cm cephalo caudad. A punctate area of restricted cortical diffusion is also present more posteriorly in the left parietal lobe. T2 signal changes are associated with the area of acute infarction. T2 signal changes are associated with the area of acute infarction. There are extensive periventricular and subcortical T2 changes bilaterally consistent with diffuse remote ischemia. Dilated perivascular spaces are present within the basal ganglia. Brainstem is normal. A single punctate remote lacunar infarct is present in the right cerebellum. The internal auditory canals are within normal limits. Vascular: Flow is present in the major intracranial arteries. Skull and upper cervical spine: Grade 1 anterolisthesis is again noted at C2-3 and C3-4. Advanced degenerative changes are noted at C4-5. Craniocervical junction is otherwise normal. Midline structures are normal. Sinuses/Orbits: The paranasal sinuses and mastoid air cells are clear. The globes and orbits are within normal limits. IMPRESSION: 1. 15 mm area of acute/subacute nonhemorrhagic infarct involving the  left precentral gyrus, consistent with the patient's right upper extremity weakness. 2. Additional punctate area of acute/subacute nonhemorrhagic infarct involving the left parietal lobe. 3. Diffuse advanced periventricular and subcortical white matter changes bilaterally reflect the sequela of chronic microvascular ischemia and multiple remote white matter infarcts. 4. Degenerative changes of the cervical spine. Electronically Signed   By: San Morelle M.D.   On: 03/21/2019 12:48      Medications:     Current Medications:  aspirin  81 mg Oral Q breakfast   atorvastatin  40 mg Oral q1800   clopidogrel  75 mg Oral Daily   enoxaparin (LOVENOX) injection  40 mg Subcutaneous Q24H   escitalopram  20 mg Oral Daily   gabapentin  300 mg Oral QHS   insulin aspart  0-15 Units Subcutaneous TID WC   insulin aspart  0-5 Units Subcutaneous QHS   insulin glargine  15 Units Subcutaneous QHS   letrozole  2.5 mg Oral Daily   risperiDONE  1 mg Oral QHS   traZODone  50 mg Oral QHS   venlafaxine XR  150 mg Oral Q breakfast     Infusions:     Assessment/Plan   1. Acute CVA  CT head - No acute cortical or basal ganglia infarct. Atherosclerosis.  MRI head - small infarcts at left precentral gyrus and left superior parietal lobe. -Possibly cardioembolic.  - Planning for loop recorder per EP.  - Set up for TEE for tomorrow.  - On statin, plavix, asa.   2. Newly diagnosed Systolic HF.  No previous history of Coronary diseased. No chest pain. Set up for coronary CT after TEE.  Reviewed by Dr Haroldine Laws.   ECHO EF 40-45%.  -Set up for TEE for tomorrow  - Volume status stable.  Will start low losartan in the next day or so. Keep SBP > 120.   3. Breast Cancer , R 2018  S/P R Lumpectomy. On Femara   4.  HTN  Stable. Keep BP 120-130  5. DMII   Length of Stay: 2  Darrick Grinder, NP  03/22/2019, 10:58 AM  Advanced Heart Failure Team Pager [phone] (M-F; 7a - 4p)  Please  contact [facility] for night-coverage after hours (4p -7a ) and weekends on [email or web address]  Patient seen and examined with the above-signed Advanced Practice Provider and/or Housestaff. I personally reviewed laboratory data, imaging studies and relevant notes. I independently examined the patient and formulated the important aspects of the plan. I have edited the note to reflect any of my changes or salient points. I have personally discussed the plan with the patient and/or family.  83 y/o woman with h/o DM and breast cancer. No h/o known CAD or AF. Presented with acute CVA. Neuro feels may be cardio-embolic. Echo with EF 40-45% with diffuse HK. Denies any cardiac symptoms: No palpitations, CP or SOB. Snores heavily but has never had sleep study  On exam General:  Elderly weak appearing. No resp difficulty HEENT: normal Neck: supple. no JVD. Carotids 2+ bilat; no bruits. No lymphadenopathy or thryomegaly appreciated. Cor: PMI nondisplaced. Regular rate & rhythm. No rubs, gallops or murmurs. Lungs: clear Abdomen: obese soft, nontender, nondistended. No hepatosplenomegaly. No bruits or masses. Good bowel sounds. Extremities: no cyanosis, clubbing, rash, edema Neuro: alert & orientedx3, cranial nerves grossly intact. moves all 4 extremities w/o difficulty. Affect pleasant  Given concern of cardio-embolic origin will plan TEE and ILR placement. EF only mildly reduced without regional wall motion abnormality so my concern for ischemic origin is less.   Agree with ASA and Plavix and statin for now. Will not start b-blocker or losartan yet in an effort to preserve BP. Can start in 1 week or so. Will consider outpatient cardiac CTA to look for CAD. Likely can follow with general cardiology on d/c as no clinical CHF and EF only mildly reduced.   Glori Bickers, MD  3:36 PM

## 2019-03-23 ENCOUNTER — Other Ambulatory Visit (HOSPITAL_COMMUNITY): Payer: Medicare HMO

## 2019-03-23 ENCOUNTER — Inpatient Hospital Stay (HOSPITAL_COMMUNITY): Payer: Medicare HMO

## 2019-03-23 ENCOUNTER — Other Ambulatory Visit: Payer: Self-pay | Admitting: Nurse Practitioner

## 2019-03-23 ENCOUNTER — Encounter (HOSPITAL_COMMUNITY)
Admission: EM | Disposition: A | Discharge status: Skilled Nursing Facility | Payer: Self-pay | Source: Home / Self Care | Attending: Internal Medicine

## 2019-03-23 DIAGNOSIS — Q211 Atrial septal defect: Secondary | ICD-10-CM

## 2019-03-23 DIAGNOSIS — I639 Cerebral infarction, unspecified: Secondary | ICD-10-CM

## 2019-03-23 DIAGNOSIS — I6389 Other cerebral infarction: Secondary | ICD-10-CM

## 2019-03-23 DIAGNOSIS — I313 Pericardial effusion (noninflammatory): Secondary | ICD-10-CM

## 2019-03-23 HISTORY — PX: BUBBLE STUDY: SHX6837

## 2019-03-23 HISTORY — PX: TEE WITHOUT CARDIOVERSION: SHX5443

## 2019-03-23 LAB — GLUCOSE, CAPILLARY
Glucose-Capillary: 131 mg/dL — ABNORMAL HIGH (ref 70–99)
Glucose-Capillary: 84 mg/dL (ref 70–99)
Glucose-Capillary: 162 mg/dL — ABNORMAL HIGH (ref 70–99)
Glucose-Capillary: 112 mg/dL — ABNORMAL HIGH (ref 70–99)

## 2019-03-23 SURGERY — ECHOCARDIOGRAM, TRANSESOPHAGEAL
Anesthesia: Moderate Sedation

## 2019-03-23 MED ORDER — LIDOCAINE VISCOUS HCL 2 % MT SOLN
OROMUCOSAL | Status: AC
  Filled 2019-03-23: qty 15

## 2019-03-23 MED ORDER — DIPHENHYDRAMINE HCL 50 MG/ML IJ SOLN
INTRAMUSCULAR | Status: AC
  Filled 2019-03-23: qty 1

## 2019-03-23 MED ORDER — FENTANYL CITRATE (PF) 100 MCG/2ML IJ SOLN
INTRAMUSCULAR | Status: AC
  Filled 2019-03-23: qty 4

## 2019-03-23 MED ORDER — MIDAZOLAM HCL (PF) 10 MG/2ML IJ SOLN
INTRAMUSCULAR | Status: DC | PRN
  Administered 2019-03-23: 2 mg via INTRAVENOUS

## 2019-03-23 MED ORDER — MIDAZOLAM HCL (PF) 5 MG/ML IJ SOLN
INTRAMUSCULAR | Status: AC
  Filled 2019-03-23: qty 2

## 2019-03-23 MED ORDER — FENTANYL CITRATE (PF) 100 MCG/2ML IJ SOLN
INTRAMUSCULAR | Status: DC | PRN
  Administered 2019-03-23: 25 ug via INTRAVENOUS

## 2019-03-23 MED ORDER — LIDOCAINE VISCOUS HCL 2 % MT SOLN
OROMUCOSAL | Status: DC | PRN
  Administered 2019-03-23: 15 mL via OROMUCOSAL

## 2019-03-23 NOTE — Interval H&P Note (Signed)
History and Physical Interval Note:  03/23/2019 10:28 AM  Amy [name]  has presented today for surgery, with the diagnosis of STROKE.  The various methods of treatment have been discussed with the patient and family. After consideration of risks, benefits and other options for treatment, the patient has consented to  Procedure(s): TRANSESOPHAGEAL ECHOCARDIOGRAM (TEE) (N/A) as a surgical intervention.  The patient's history has been reviewed, patient examined, no change in status, stable for surgery.  I have reviewed the patient's chart and labs.  Questions were answered to the patient's satisfaction.     Keia Rask

## 2019-03-23 NOTE — CV Procedure (Addendum)
    TRANSESOPHAGEAL ECHOCARDIOGRAM   NAME:  Amy [name]   MRN: [id number] DOB:  Oct 02, 1935   ADMIT DATE: 03/20/2019  INDICATIONS:  stroke  PROCEDURE:   Informed consent was obtained prior to the procedure. The risks, benefits and alternatives for the procedure were discussed and the patient comprehended these risks.  Risks include, but are not limited to, cough, sore throat, vomiting, nausea, somnolence, esophageal and stomach trauma or perforation, bleeding, low blood pressure, aspiration, pneumonia, infection, trauma to the teeth and death.    After a procedural time-out, the patient was given 2 mg versed and 25 mcg fentanyl for moderate sedation.  The oropharynx was anesthetized 10 cc of topical 1% viscous lidocaine.  The transesophageal probe was inserted in the esophagus and stomach without difficulty and multiple views were obtained.    COMPLICATIONS:    There were no immediate complications.  FINDINGS:  LEFT VENTRICLE: EF = 30-35%. Diffuse HK. No LV clot  RIGHT VENTRICLE: Normal size and function.   LEFT ATRIUM: Mildly dilated  LEFT ATRIAL APPENDAGE: No thrombus.   RIGHT ATRIUM: Normal  AORTIC VALVE:  Trileaflet. Trivial AI  MITRAL VALVE:    Mildly restricted posterior leaflet Trivial MR  TRICUSPID VALVE: Normal. Trivial TR  PULMONIC VALVE: Grossly normal. No PI  INTERATRIAL SEPTUM: Lipomatous. Small PFO with early + Bubble study  PERICARDIUM: Small pericardial effusion  DESCENDING AORTA: Very heavy plaque   CONCLUSION:  EF 30-35%  Small PFO with early positive bubble study  Severe aortic atherosclerosis   Daniel Bensimhon,MD 10:55 AM

## 2019-03-23 NOTE — Progress Notes (Signed)
  Speech Language Pathology Treatment: Cognitive-Linquistic  Patient Details Name: Amy [name] MRN: [id number] DOB: May 08, 1936 Today's Date: 03/23/2019 Time: [id number] SLP Time Calculation (min) (ACUTE ONLY): 21 min  Assessment / Plan / Recommendation Clinical Impression  Pt was seen for cognitive-linguistic treatment and was cooperative throughout the session. She reported that she believes her cognition is currently at baseline but stated that she has some memory difficulty "like any 83 year old". She was able to recall some events from the day but required cues for recall of other events such as her working with physical therapy. She required min cues for sequencing the steps needed to call her son and stated that she attempted to call him earlier using the remote. She demonstrated 100% accuracy with abstract reasoning and 71% accuracy with a medication management (prescription) task increasing to 100% with min cues. SLP will continue to follow pt.     HPI HPI: Pt is an 83 y.o. female with a history of diabetes, anxiety and depression, invasive ductal carcinoma of the right breast on Femara, arthritis.  Patient seen for right arm weakness and unwitnessed fall. CT negative, MRI head - small infarcts at left precentral gyrus and left superior parietal lobe      SLP Plan  Continue with current plan of care       Recommendations                   Follow up Recommendations: Skilled Nursing facility SLP Visit Diagnosis: Cognitive communication deficit [id number]) Plan: Continue with current plan of care       Sims Laday I. Hardin Negus, [place], [place] Office number [phone] Pager [place] 03/23/2019, 6:05 PM

## 2019-03-23 NOTE — Progress Notes (Signed)
  Echocardiogram Echocardiogram Transesophageal has been performed.  Amy [name] 03/23/2019, 11:08 AM

## 2019-03-23 NOTE — Progress Notes (Signed)
Inpatient Rehabilitation-Admissions Coordinator   Due to lack of assistance at DC the patient will need SNF for a longer term rehab stay. CM aware.   AC will sign off. Please call if questions.   Jhonnie Garner, OTR/L  Rehab Admissions Coordinator  [phone] 03/23/2019 1:36 PM

## 2019-03-23 NOTE — Care Management Important Message (Signed)
Important Message  Patient Details  Name: Amy [name] MRN: [id number] Date of Birth: [birth year]   Medicare Important Message Given:  Yes     Jerick Khachatryan 03/23/2019, 2:15 PM

## 2019-03-23 NOTE — Plan of Care (Signed)
Progressing towards goals

## 2019-03-23 NOTE — Progress Notes (Addendum)
Advanced Heart Failure Rounding Note   Subjective:     Remains weak. No CP or SOB. Remains in NSR.    Objective:   Weight Range:  Vital Signs:   Temp:  [97.6 F (36.4 C)-98 F (36.7 C)] 97.7 F (36.5 C) (08/25 0838) Pulse Rate:  [77-88] 83 (08/25 0838) Resp:  [15-20] 20 (08/25 0838) BP: (122-143)/(58-90) 128/90 (08/25 0838) SpO2:  [96 %-98 %] 98 % (08/25 0838) Last BM Date: 03/22/19  Weight change: Filed Weights   03/20/19 1351 03/20/19 2105 03/20/19 2251  Weight: 109.7 kg 110 kg 77.4 kg    Intake/Output:   Intake/Output Summary (Last 24 hours) at 03/23/2019 0844 Last data filed at 03/23/2019 0840 Gross per 24 hour  Intake 789.72 ml  Output 1500 ml  Net -710.28 ml     Physical Exam: General:  Weak appearing. No resp difficulty HEENT: normal Neck: supple. JVP 6. Carotids 2+ bilat; no bruits. No lymphadenopathy or thryomegaly appreciated. Cor: PMI nondisplaced. Regular rate & rhythm. No rubs, gallops or murmurs. Lungs: clear Abdomen: soft, nontender, nondistended. No hepatosplenomegaly. No bruits or masses. Good bowel sounds. Extremities: no cyanosis, clubbing, rash, edema Neuro: alert & orientedx3, cranial nerves grossly intact. moves all 4 extremities w/o difficulty. Affect pleasant  Telemetry: NSR   Labs: Basic Metabolic Panel: Recent Labs  Lab 03/20/19 1348 03/20/19 1407  NA 136 135  K 4.2 4.2  CL 101 102  CO2 25  --   GLUCOSE 115* 113*  BUN 19 19  CREATININE 0.72 0.80  CALCIUM 8.9  --     Liver Function Tests: Recent Labs  Lab 03/20/19 1348  AST 19  ALT 12  ALKPHOS 66  BILITOT 0.6  PROT 6.9  ALBUMIN 3.9   No results for input(s): LIPASE, AMYLASE in the last 168 hours. No results for input(s): AMMONIA in the last 168 hours.  CBC: Recent Labs  Lab 03/20/19 1348 03/20/19 1407  WBC 10.6*  --   NEUTROABS 8.6*  --   HGB 14.3 14.3  HCT 43.9 42.0  MCV 91.6  --   PLT 226  --     Cardiac Enzymes: No results for input(s):  CKTOTAL, CKMB, CKMBINDEX, TROPONINI in the last 168 hours.  BNP: BNP (last 3 results) No results for input(s): BNP in the last 8760 hours.  ProBNP (last 3 results) No results for input(s): PROBNP in the last 8760 hours.    Other results:  Imaging: Ct Angio Head W Or Wo Contrast  Result Date: 03/21/2019 CLINICAL DATA:  Stroke, follow-up. EXAM: CT ANGIOGRAPHY HEAD AND NECK TECHNIQUE: Multidetector CT imaging of the head and neck was performed using the standard protocol during bolus administration of intravenous contrast. Multiplanar CT image reconstructions and MIPs were obtained to evaluate the vascular anatomy. Carotid stenosis measurements (when applicable) are obtained utilizing NASCET criteria, using the distal internal carotid diameter as the denominator. CONTRAST:  64mL OMNIPAQUE IOHEXOL 350 MG/ML SOLN COMPARISON:  CT head without contrast 03/20/2019. FINDINGS: CTA NECK FINDINGS Aortic arch: Atherosclerotic calcifications are present at the origin of the left subclavian artery without a significant stenosis. Additional calcifications are present in the distal aortic arch and descending thoracic aorta without aneurysm. Great vessel origins are otherwise within normal limits. Right carotid system: The right common carotid artery is within normal limits. Calcifications are present at the right carotid bifurcation without a significant stenosis. There is moderate tortuosity in the proximal cervical right ICA without a significant stenosis. Left carotid system: The left  common carotid artery is within normal limits. Bifurcation is unremarkable. There is moderate tortuosity of the cervical left ICA without a significant stenosis. Vertebral arteries: The vertebral arteries originate from the subclavian arteries bilaterally. The right vertebral artery is dominant. There is no significant stenosis of either vertebral artery in the neck. Skeleton: Levoconvex curvature of the cervical spine is centered at  C4. Grade 1 anterolisthesis is present at C3-4. There is chronic loss of disc height and endplate change at D34-534, C5-6, and C6-7. No focal lytic or blastic lesions are present. Other neck: No focal mucosal or submucosal lesions are present. Nasopharynx is unremarkable. Soft palate and tongue base are normal. Salivary glands are within normal limits. No significant adenopathy is present. Vocal cords are midline and symmetric. Thyroid is normal. Upper chest: Scattered ground-glass attenuation is present without focal consolidation. There is no nodule or mass lesion. No pneumothorax is present. The thoracic inlet is normal. There is mild prominence pericardial fluid. Review of the MIP images confirms the above findings CTA HEAD FINDINGS Anterior circulation: Minimal atherosclerotic changes are present within the cavernous left internal carotid artery. There is no significant stenosis of either carotid artery through the ICA terminus. The A1 and M1 segments are normal. The anterior communicating artery is patent. There is no significant proximal stenosis or occlusion. There is some asymmetric vascularity over the posterior left frontal and parietal lobe without cortical gray matter differentiation loss. Posterior circulation: The right vertebral artery is the dominant vessel. PICA origins are visualized and normal. Vertebrobasilar junction is normal. The basilar artery is unremarkable. Both posterior cerebral arteries originate from the basilar tip. Mild irregularity is present in the posterior cerebral arteries without a significant stenosis. Venous sinuses: The dural sinuses are patent. Straight sinus deep cerebral veins are intact. Right transverse sinus is dominant. Cortical veins are unremarkable. Anatomic variants: None Review of the MIP images confirms the above findings IMPRESSION: 1. No emergent large vessel occlusion. 2. Asymmetric prominence of MCA branch vessels over the left posterior frontal and parietal  lobes. This may represent luxury perfusion following TIA or infarct. Gray-white differentiation is preserved without evidence of a significant cortical infarct. 3. Mild atherosclerotic changes at the aortic arch and right carotid bifurcation without significant stenosis. 4. Multilevel degenerative changes in the cervical spine as described. 5. Scattered ground-glass attenuation lung apices likely represents atelectasis or edema. Electronically Signed   By: San Morelle M.D.   On: 03/21/2019 11:05   Ct Angio Neck W Or Wo Contrast  Result Date: 03/21/2019 CLINICAL DATA:  Stroke, follow-up. EXAM: CT ANGIOGRAPHY HEAD AND NECK TECHNIQUE: Multidetector CT imaging of the head and neck was performed using the standard protocol during bolus administration of intravenous contrast. Multiplanar CT image reconstructions and MIPs were obtained to evaluate the vascular anatomy. Carotid stenosis measurements (when applicable) are obtained utilizing NASCET criteria, using the distal internal carotid diameter as the denominator. CONTRAST:  20mL OMNIPAQUE IOHEXOL 350 MG/ML SOLN COMPARISON:  CT head without contrast 03/20/2019. FINDINGS: CTA NECK FINDINGS Aortic arch: Atherosclerotic calcifications are present at the origin of the left subclavian artery without a significant stenosis. Additional calcifications are present in the distal aortic arch and descending thoracic aorta without aneurysm. Great vessel origins are otherwise within normal limits. Right carotid system: The right common carotid artery is within normal limits. Calcifications are present at the right carotid bifurcation without a significant stenosis. There is moderate tortuosity in the proximal cervical right ICA without a significant stenosis. Left carotid system: The left  common carotid artery is within normal limits. Bifurcation is unremarkable. There is moderate tortuosity of the cervical left ICA without a significant stenosis. Vertebral arteries: The  vertebral arteries originate from the subclavian arteries bilaterally. The right vertebral artery is dominant. There is no significant stenosis of either vertebral artery in the neck. Skeleton: Levoconvex curvature of the cervical spine is centered at C4. Grade 1 anterolisthesis is present at C3-4. There is chronic loss of disc height and endplate change at D34-534, C5-6, and C6-7. No focal lytic or blastic lesions are present. Other neck: No focal mucosal or submucosal lesions are present. Nasopharynx is unremarkable. Soft palate and tongue base are normal. Salivary glands are within normal limits. No significant adenopathy is present. Vocal cords are midline and symmetric. Thyroid is normal. Upper chest: Scattered ground-glass attenuation is present without focal consolidation. There is no nodule or mass lesion. No pneumothorax is present. The thoracic inlet is normal. There is mild prominence pericardial fluid. Review of the MIP images confirms the above findings CTA HEAD FINDINGS Anterior circulation: Minimal atherosclerotic changes are present within the cavernous left internal carotid artery. There is no significant stenosis of either carotid artery through the ICA terminus. The A1 and M1 segments are normal. The anterior communicating artery is patent. There is no significant proximal stenosis or occlusion. There is some asymmetric vascularity over the posterior left frontal and parietal lobe without cortical gray matter differentiation loss. Posterior circulation: The right vertebral artery is the dominant vessel. PICA origins are visualized and normal. Vertebrobasilar junction is normal. The basilar artery is unremarkable. Both posterior cerebral arteries originate from the basilar tip. Mild irregularity is present in the posterior cerebral arteries without a significant stenosis. Venous sinuses: The dural sinuses are patent. Straight sinus deep cerebral veins are intact. Right transverse sinus is dominant.  Cortical veins are unremarkable. Anatomic variants: None Review of the MIP images confirms the above findings IMPRESSION: 1. No emergent large vessel occlusion. 2. Asymmetric prominence of MCA branch vessels over the left posterior frontal and parietal lobes. This may represent luxury perfusion following TIA or infarct. Gray-white differentiation is preserved without evidence of a significant cortical infarct. 3. Mild atherosclerotic changes at the aortic arch and right carotid bifurcation without significant stenosis. 4. Multilevel degenerative changes in the cervical spine as described. 5. Scattered ground-glass attenuation lung apices likely represents atelectasis or edema. Electronically Signed   By: San Morelle M.D.   On: 03/21/2019 11:05   Mr Brain Wo Contrast  Result Date: 03/21/2019 CLINICAL DATA:  Focal neuro deficit, > 6 hrs, stroke suspected. Confusion. Right-sided weakness. EXAM: MRI HEAD WITHOUT CONTRAST TECHNIQUE: Multiplanar, multiecho pulse sequences of the brain and surrounding structures were obtained without intravenous contrast. COMPARISON:  CT head without contrast 03/20/2019. CTA of the head and neck 03/21/2019. FINDINGS: Brain: The diffusion-weighted images demonstrate acute nonhemorrhagic infarct involving the left precentral gyrus measuring 1.5 cm cephalo caudad. A punctate area of restricted cortical diffusion is also present more posteriorly in the left parietal lobe. T2 signal changes are associated with the area of acute infarction. T2 signal changes are associated with the area of acute infarction. There are extensive periventricular and subcortical T2 changes bilaterally consistent with diffuse remote ischemia. Dilated perivascular spaces are present within the basal ganglia. Brainstem is normal. A single punctate remote lacunar infarct is present in the right cerebellum. The internal auditory canals are within normal limits. Vascular: Flow is present in the major  intracranial arteries. Skull and upper cervical spine: Grade 1  anterolisthesis is again noted at C2-3 and C3-4. Advanced degenerative changes are noted at C4-5. Craniocervical junction is otherwise normal. Midline structures are normal. Sinuses/Orbits: The paranasal sinuses and mastoid air cells are clear. The globes and orbits are within normal limits. IMPRESSION: 1. 15 mm area of acute/subacute nonhemorrhagic infarct involving the left precentral gyrus, consistent with the patient's right upper extremity weakness. 2. Additional punctate area of acute/subacute nonhemorrhagic infarct involving the left parietal lobe. 3. Diffuse advanced periventricular and subcortical white matter changes bilaterally reflect the sequela of chronic microvascular ischemia and multiple remote white matter infarcts. 4. Degenerative changes of the cervical spine. Electronically Signed   By: San Morelle M.D.   On: 03/21/2019 12:48      Medications:     Scheduled Medications:  aspirin  81 mg Oral Q breakfast   atorvastatin  40 mg Oral q1800   clopidogrel  75 mg Oral Daily   enoxaparin (LOVENOX) injection  40 mg Subcutaneous Q24H   escitalopram  20 mg Oral Daily   gabapentin  300 mg Oral QHS   insulin aspart  0-15 Units Subcutaneous TID WC   insulin aspart  0-5 Units Subcutaneous QHS   insulin glargine  15 Units Subcutaneous QHS   letrozole  2.5 mg Oral Daily   risperiDONE  1 mg Oral QHS   traZODone  50 mg Oral QHS   venlafaxine XR  150 mg Oral Q breakfast     Infusions:  sodium chloride 20 mL/hr at 03/23/19 0000     PRN Medications:  acetaminophen **OR** acetaminophen (TYLENOL) oral liquid 160 mg/5 mL **OR** acetaminophen, ALPRAZolam, polyvinyl alcohol, senna-docusate   Assessment/Plan:   1. Acute CVA  CT head-No acute cortical or basal ganglia infarct. Atherosclerosis.  MRI head -small infarcts at left precentral gyrus and left superior parietal lobe. - Per Neuro.  Possibly cardioembolic.  - Planning for loop recorder per EP to evalaute for AF.  - TEE today - On statin, plavix, asa.   2. Newly diagnosed cardiomyopathy (mild) .  - No previous history of coronary disease. No chest pain.  - EF ~35% -TEE today.  Set up for coronary CT as outpatient after TEE.  - Volume status stable.  Will start low losartan in the next day or so. Keep SBP > 120-130s in setting of recent CVA,. We can start in CIR .   3. Breast Cancer , R 2018  S/P R Lumpectomy. On Femara   4.  HTN  Stable. Keep SBP 120-130 in setting of recent CVA  5. DMII     Length of Stay: 3   Glori Bickers MD 03/23/2019, 8:44 AM  Advanced Heart Failure Team Pager [phone] (M-F; [place])  Please contact [facility] for night-coverage after hours (4p -7a ) and weekends on [email or web address]

## 2019-03-23 NOTE — Progress Notes (Signed)
STROKE TEAM PROGRESS NOTE   INTERVAL HISTORY Her RN is at the bedside.  Pt right sided weakness improved. TEE showed EF 30-35% with small PFO and severe severe aortic athero. Recommend 30 day monitoring.    Vitals:   03/23/19 1050 03/23/19 1055 03/23/19 1108 03/23/19 1223  BP: 126/74 (!) 151/64 140/63 (!) 112/50  Pulse: 82   86  Resp: 13 12 18 18   Temp:  98 F (36.7 C)  97.7 F (36.5 C)  TempSrc:  Temporal  Oral  SpO2: 96% 98% 95% 95%  Weight:      Height:        CBC:  Recent Labs  Lab 03/20/19 1348 03/20/19 1407  WBC 10.6*  --   NEUTROABS 8.6*  --   HGB 14.3 14.3  HCT 43.9 42.0  MCV 91.6  --   PLT 226  --     Basic Metabolic Panel:  Recent Labs  Lab 03/20/19 1348 03/20/19 1407  NA 136 135  K 4.2 4.2  CL 101 102  CO2 25  --   GLUCOSE 115* 113*  BUN 19 19  CREATININE 0.72 0.80  CALCIUM 8.9  --    Lipid Panel:     Component Value Date/Time   CHOL 264 (H) 03/21/2019 0647   TRIG 174 (H) 03/21/2019 0647   HDL 76 03/21/2019 0647   CHOLHDL 3.5 03/21/2019 0647   VLDL 35 03/21/2019 0647   LDLCALC 153 (H) 03/21/2019 0647   HgbA1c:  Lab Results  Component Value Date   HGBA1C 6.3 (H) 03/21/2019   Urine Drug Screen:     Component Value Date/Time   LABOPIA NONE DETECTED 03/20/2019 1348   COCAINSCRNUR NONE DETECTED 03/20/2019 1348   LABBENZ NONE DETECTED 03/20/2019 1348   AMPHETMU NONE DETECTED 03/20/2019 1348   THCU NONE DETECTED 03/20/2019 1348   LABBARB NONE DETECTED 03/20/2019 1348    Alcohol Level     Component Value Date/Time   ETH <10 03/20/2019 1403    IMAGING No results found.  PHYSICAL EXAM   General - Well nourished, well developed, in no apparent distress.  Ophthalmologic - fundi not visualized due to noncooperation.  Cardiovascular - Regular rate and rhythm.  Mental Status -  Level of arousal and orientation to self, age, place, and person were intact, but not orientated to time. Language including expression, naming,  repetition, comprehension was assessed and found intact.  Cranial Nerves II - XII - II - Visual field intact OU. III, IV, VI - Extraocular movements intact. V - Facial sensation intact bilaterally. VII - Facial movement intact bilaterally. VIII - Hearing & vestibular intact bilaterally. X - Palate elevates symmetrically. XI - Chin turning & shoulder shrug intact bilaterally. XII - Tongue protrusion intact.  Motor Strength - The patient's strength was normal in LUE however RUE 4/5 deltoid, 5/5 bicep, 4/5 tricep and finger grip 4-/5. BLE proximal 4+/5 and 5/5 distal.  Bulk was normal and fasciculations were absent.   Motor Tone - Muscle tone was assessed at the neck and appendages and was normal.  Reflexes - The patient's reflexes were symmetrical in all extremities and she had no pathological reflexes.  Sensory - Light touch, temperature/pinprick were assessed and were decreased on the RLE.    Coordination - The patient had normal movements in left hand with no ataxia or dysmetria.  Tremor was absent.  Gait and Station - deferred.   ASSESSMENT/PLAN Amy [name] is a 83 y.o. female with history of hyperlidemia,  DM, Rt breast cancer stage 1a s/p surgery and on chemo with Femara, anxiety, depression, and osteopenia presenting to [facility] ED after being found down with R sided weakness. Transferred to [facility] for further evaluation.  Stroke - small infarcts at left precentral gyrus and left superior parietal lobe, embolic pattern, source unclear  CT head - No acute cortical or basal ganglia infarct. Atherosclerosis.  MRI head - small infarcts at left precentral gyrus and left superior parietal lobe,  CTA H&N - unremarkable  2D Echo - EF 35-40% with diffuse hypokinesis  TEE showed EF 30-35% with small PFO and severe aortic athero  Cardiology recommend 30 day monitoring to rule out afib  [employer] Virus 2 - negative  LDL - 153  HgbA1c 6.3  UDS -  negative  Lovenox 40 mg sq daily  - SCDs  aspirin 325 mg daily prior to admission, now on ASA 81 and plavix 75mg  DAPT. Continue DAPT for 3 weeks and then plavix alone  Therapy recommendations:  CIR  Disposition:  Pending  Cardiomyopathy   EF 35-40% with diffuse hypokinesis  No hx of CHF  TEE EF 30-35% with small PFO and severe aortic athero  As per card, pt will need outpt cath once stabilized  Breast cancer on Femara  Stage Ia in 2018  S/p lumpectomy  On Femara  There is about 2-3% stroke risk in pt taking Femara. Recommend pt to discuss with oncology to see if continue medication would be appropriate. Currently, do not think this is a big concern for stroke as pt has alternative cause to explain the stroke.  Hypertension  Stable  Permissive hypertension (OK if < 220/120) but gradually normalize in 5-7 days  Long-term BP goal normotensive  Hyperlipidemia  Lipid lowering medication PTA:  none  LDL 153, goal < 70  Current lipid lowering medication: lipitor 40  Continue statin at discharge  Diabetes  HgbA1c 6.3, goal < 7.0  Controlled  SSI  CBG monitoring  PCP follow up  Other Stroke Risk Factors  Advanced age  Former cigarette smoker - quit  Other Active Problems  Mild leukocytosis - 10.6 (afebrile)  Hospital day # 3  Neurology will sign off. Please call with questions. Pt will follow up with stroke clinic Dr. Leonie [name] at [facility] in about 4 weeks. Thanks for the consult.   Amy Hawking, MD PhD Stroke Neurology 03/23/2019 1:21 PM    To contact Stroke Continuity provider, please refer to [email or web address] After hours, contact General Neurology

## 2019-03-23 NOTE — NC FL2 (Signed)
[place] LEVEL OF CARE SCREENING TOOL     IDENTIFICATION  Patient Name: Amy [name] Birthdate: 1935-10-10 Sex: female Admission Date (Current Location): 03/20/2019  [facility] and [place] Number:  [employer] and Address:  [facility], [place] [address], [place], [place] [address]      Provider Number: [id number]  Attending Physician Name and Address:  Barb Merino, MD  Relative Name and Phone Number:       Current Level of Care: Hospital Recommended Level of Care: [place] Prior Approval Number:    Date Approved/Denied:   PASRR Number:    Discharge Plan: SNF    Current Diagnoses: Patient Active Problem List   Diagnosis Date Noted  . Ischemic stroke ([place]) 03/20/2019  . Edema of both lower legs due to peripheral venous insufficiency 10/12/2018  . Anemia 05/12/2018  . S/P total knee replacement, right 05/05/18 05/12/2018  . Type 2 diabetes mellitus with neurological complications ([place]) [id number]  . Primary osteoarthritis of right knee   . Status post surgery 01/26/2018  . Surgery, elective 01/26/2018  . Degenerative spondylolisthesis 01/26/2018  . Osteoporosis 12/13/2016  . Osteopenia 11/26/2016  . Invasive ductal carcinoma of breast, female, right ([place])   . Lumbago 03/22/2014  . Stiffness of joint, not elsewhere classified, pelvic region and thigh 03/22/2014  . Muscle weakness (generalized) 03/22/2014  . Abnormality of gait 03/22/2014  . Depression with anxiety 09/03/2012  . Insomnia secondary to depression with anxiety 09/03/2012  . Pain 09/03/2012    Orientation RESPIRATION BLADDER Height & Weight     Self, Time, Situation, Place  Normal Incontinent Weight: 77.4 kg Height:  5\' 4"  (162.6 cm)  BEHAVIORAL SYMPTOMS/MOOD NEUROLOGICAL BOWEL NUTRITION STATUS      Continent Diet(heart healthy)  AMBULATORY STATUS COMMUNICATION OF NEEDS Skin   Extensive Assist Verbally Normal                     Personal Care Assistance Level of Assistance  Bathing, Dressing, Feeding Bathing Assistance: Maximum assistance Feeding assistance: Limited assistance Dressing Assistance: Maximum assistance     Functional Limitations Info  Sight, Hearing, Speech Sight Info: Adequate Hearing Info: Impaired Speech Info: Adequate    SPECIAL CARE FACTORS FREQUENCY  PT (By licensed PT), Speech therapy, OT (By licensed OT)     PT Frequency: 5x/wk OT Frequency: 5x/wk     Speech Therapy Frequency: 5x/wk      Contractures Contractures Info: Not present    Additional Factors Info  Code Status, Allergies, Psychotropic, Insulin Sliding Scale Code Status Info: Full Allergies Info: cymbalta Psychotropic Info: Lexapro 20 mg daily/ neurontin 300mg  at bedtime/ trazadone 50 mg daily/ risperdal 1 mg daily/ effexor XR 150 md daily Insulin Sliding Scale Info: novolog 0-15 units SQ three times a day/ novolog 0-5 units SQ at night       Current Medications (03/23/2019):  This is the current hospital active medication list Current Facility-Administered Medications  Medication Dose Route Frequency Provider Last Rate Last Dose  . acetaminophen (TYLENOL) tablet 650 mg  650 mg Oral Q4H PRN Truett Mainland, DO       Or  . acetaminophen (TYLENOL) solution 650 mg  650 mg Per Tube Q4H PRN Truett Mainland, DO       Or  . acetaminophen (TYLENOL) suppository 650 mg  650 mg Rectal Q4H PRN Truett Mainland, DO      . ALPRAZolam Duanne Moron) tablet 0.5 mg  0.5  mg Oral BID PRN Truett Mainland, DO   0.5 mg at 03/21/19 2015  . aspirin EC tablet 81 mg  81 mg Oral Q breakfast Rosalin Hawking, MD   81 mg at 03/23/19 1216  . atorvastatin (LIPITOR) tablet 40 mg  40 mg Oral q1800 Barb Merino, MD   40 mg at 03/22/19 1805  . clopidogrel (PLAVIX) tablet 75 mg  75 mg Oral Daily Rosalin Hawking, MD   75 mg at 03/22/19 1020  . enoxaparin (LOVENOX) injection 40 mg  40 mg Subcutaneous Q24H Rosalin Hawking, MD   40 mg at 03/22/19 2028  .  escitalopram (LEXAPRO) tablet 20 mg  20 mg Oral Daily Truett Mainland, DO   20 mg at 03/23/19 1218  . gabapentin (NEURONTIN) capsule 300 mg  300 mg Oral QHS Truett Mainland, DO   300 mg at 03/22/19 2133  . insulin aspart (novoLOG) injection 0-15 Units  0-15 Units Subcutaneous TID WC Truett Mainland, DO   2 Units at 03/23/19 0732  . insulin aspart (novoLOG) injection 0-5 Units  0-5 Units Subcutaneous QHS Stinson, Jacob J, DO      . insulin glargine (LANTUS) injection 15 Units  15 Units Subcutaneous QHS Truett Mainland, DO   15 Units at 03/22/19 2140  . letrozole [facility]) tablet 2.5 mg  2.5 mg Oral Daily Truett Mainland, DO   2.5 mg at 03/22/19 1019  . polyvinyl alcohol (LIQUIFILM TEARS) 1.4 % ophthalmic solution 1 drop  1 drop Both Eyes Daily PRN Truett Mainland, DO      . risperiDONE (RISPERDAL) tablet 1 mg  1 mg Oral QHS Truett Mainland, DO   1 mg at 03/22/19 2133  . senna-docusate (Senokot-S) tablet 1 tablet  1 tablet Oral QHS PRN Truett Mainland, DO      . traZODone (DESYREL) tablet 50 mg  50 mg Oral QHS Truett Mainland, DO   50 mg at 03/22/19 2133  . venlafaxine XR (EFFEXOR-XR) 24 hr capsule 150 mg  150 mg Oral Q breakfast Truett Mainland, DO   150 mg at 03/23/19 1217     Discharge Medications: Please see discharge summary for a list of discharge medications.  Relevant Imaging Results:  Relevant Lab Results:   Additional Information SS#: [id number]  Pollie Friar, RN

## 2019-03-23 NOTE — Consult Note (Addendum)
ELECTROPHYSIOLOGY CONSULT NOTE  Patient ID: Amy [name] MRN: [id number], DOB/AGE: 81-Jul-1937   Admit date: 03/20/2019 Date of Consult: 03/23/2019  Primary Physician: Sinda Du, MD Primary Cardiologist: Elsa Reason for Consultation: Cryptogenic stroke; recommendations regarding Implantable Loop Recorder  History of Present Illness EP has been asked to evaluate Amy [name] for placement of an implantable loop recorder to monitor for atrial fibrillation by Dr Erlinda Hong.  The patient was admitted on 03/20/2019 with right sided weakness.  They first developed symptoms the day of admission.  Imaging demonstrated small infarcts at left precentral gyrus and left superior parietal lobe felt to be embolic 2/2 unknown source.  she has undergone workup for stroke including echocardiogram and carotid dopplers.  The patient has been monitored on telemetry which has demonstrated sinus rhythm with no arrhythmias.  Inpatient stroke work-up is to be completed with a TEE.   Echocardiogram this admission demonstrated EF 35-40%, mild AS.  Lab work is reviewed.  Prior to admission, the patient denies chest pain, shortness of breath, dizziness, palpitations, or syncope.  They are recovering from their stroke with plans to go to CIR at discharge.    Past Medical History:  Diagnosis Date   Anxiety    Arthritis    Back pain    Colon polyps    Depression    Diabetes mellitus, type II ([place])    diet controlled   Hypercholesterolemia    Invasive ductal carcinoma of breast, female, right ([place])    Osteopenia 11/26/2016   Skin-picking disorder    has 3 open wounds on right wrist that are being treated. About a quarter in size.     Surgical History:  Past Surgical History:  Procedure Laterality Date   APPENDECTOMY     BREAST BIOPSY Right 10/09/2016   Procedure: BREAST BIOPSY WITH NEEDLE LOCALIZATION;  Surgeon: [name], MD;  Location: [facility];  Service: General;  Laterality: Right;    BREAST SURGERY Right    BUNIONECTOMY Bilateral    FACIAL COSMETIC SURGERY     drooping eyelid and brow   HERNIA REPAIR Right    Inguinal x2   KNEE ARTHROSCOPY Right    SHOULDER SURGERY Right    rotator cuff repair and removal of bone spur   TEMPOROMANDIBULAR JOINT SURGERY Right    TOTAL KNEE ARTHROPLASTY Right 05/05/2018   Procedure: TOTAL KNEE ARTHROPLASTY;  Surgeon: [name], MD;  Location: [facility];  Service: Orthopedics;  Laterality: Right;     Medications Prior to Admission  Medication Sig Dispense Refill Last Dose   ALPRAZolam (XANAX) 0.5 MG tablet TAKE 1 TABLET(0.5 MG) BY MOUTH DAILY AS NEEDED FOR ANXIETY 30 tablet 2 03/20/2019 at Unknown time   calcium carbonate (OSCAL) 1500 (600 Ca) MG TABS tablet Take 600 mg of elemental calcium by mouth daily.   03/20/2019 at Unknown time   cholecalciferol (VITAMIN D) 1000 units tablet Take 1 tablet (1,000 Units total) by mouth daily. 30 tablet 11 03/20/2019 at Unknown time   escitalopram (LEXAPRO) 20 MG tablet Take 1 tablet (20 mg total) by mouth daily. 30 tablet 2 03/20/2019 at Unknown time   gabapentin (NEURONTIN) 300 MG capsule Take 300 mg by mouth at bedtime.   5 03/19/2019 at Unknown time   ibuprofen (ADVIL,MOTRIN) 800 MG tablet Take 1 tablet (800 mg total) by mouth every 8 (eight) hours as needed. 90 tablet 1 03/20/2019 at Unknown time   letrozole (FEMARA) 2.5 MG tablet TAKE 1 TABLET(2.5 MG) BY MOUTH DAILY  30 tablet 3 03/20/2019 at Unknown time   metFORMIN (GLUCOPHAGE) 500 MG tablet Take 500 mg by mouth 2 (two) times daily with a meal.    03/20/2019 at Unknown time   risperiDONE (RISPERDAL) 1 MG tablet Take 1 tablet (1 mg total) by mouth at bedtime. 30 tablet 2 03/19/2019 at Unknown time   traZODone (DESYREL) 50 MG tablet TAKE 1 TABLET(50 MG) BY MOUTH AT BEDTIME 30 tablet 2 03/19/2019 at Unknown time   venlafaxine XR (EFFEXOR-XR) 150 MG 24 hr capsule TAKE 1 CAPSULE(150 MG) BY MOUTH TWICE DAILY (Patient taking  differently: 150 mg daily with breakfast. ) 60 capsule 2 03/20/2019 at Unknown time   aspirin EC 325 MG EC tablet Take 1 tablet (325 mg total) by mouth daily with breakfast. (Patient not taking: Reported on 12/08/2018) 30 tablet 0    Glycerin-Hypromellose-PEG 400 (HM DRY EYE RELIEF) 0.2-0.2-1 % SOLN Apply 1 drop to eye daily as needed (dry eyes).      ONE TOUCH ULTRA TEST test strip USE TO TEST BLOOD SUGAR ONCE DAILY       Inpatient Medications:   aspirin  81 mg Oral Q breakfast   atorvastatin  40 mg Oral q1800   clopidogrel  75 mg Oral Daily   enoxaparin (LOVENOX) injection  40 mg Subcutaneous Q24H   escitalopram  20 mg Oral Daily   gabapentin  300 mg Oral QHS   insulin aspart  0-15 Units Subcutaneous TID WC   insulin aspart  0-5 Units Subcutaneous QHS   insulin glargine  15 Units Subcutaneous QHS   letrozole  2.5 mg Oral Daily   risperiDONE  1 mg Oral QHS   traZODone  50 mg Oral QHS   venlafaxine XR  150 mg Oral Q breakfast    Allergies:  Allergies  Allergen Reactions   Cymbalta [Duloxetine Hcl] Other (See Comments)    Hallucinated and couldn't think; got worse and worse the longer she took this.    Social History   Socioeconomic History   Marital status: Divorced    Spouse name: Not on file   Number of children: Not on file   Years of education: Not on file   Highest education level: Not on file  Occupational History   Occupation: Nurse    Comment: Retired  Scientist, product/process development strain: Not hard at [employer] insecurity    Worry: Never true    Inability: Never true   Transportation needs    Medical: No    Non-medical: No  Tobacco Use   Smoking status: Former Smoker    Packs/day: 0.50    Years: 40.00    Pack years: 20.00    Types: Cigarettes    Quit date: 09/04/1995    Years since quitting: 23.5   Smokeless tobacco: Never Used  Substance and Sexual Activity   Alcohol use: No   Drug use: No    Types: Benzodiazepines,  Hydrocodone   Sexual activity: Never  Lifestyle   Physical activity    Days per week: 0 days    Minutes per session: 0 min   Stress: Only a little  Relationships   Social connections    Talks on phone: Once a week    Gets together: Never    Attends religious service: More than 4 times per year    Active member of club or organization: No    Attends meetings of clubs or organizations: Never    Relationship status: Divorced  Intimate partner violence    Fear of current or ex partner: No    Emotionally abused: No    Physically abused: No    Forced sexual activity: No  Other Topics Concern   Not on file  Social History Narrative   Not on file     Family History  Problem Relation Age of Onset   Anxiety disorder Mother    Depression Mother    Depression Maternal Grandmother    Alcohol abuse Brother    Cancer - Other Sister    ADD / ADHD Neg Hx    Bipolar disorder Neg Hx    Dementia Neg Hx    Drug abuse Neg Hx    OCD Neg Hx    Paranoid behavior Neg Hx    Schizophrenia Neg Hx    Seizures Neg Hx    Sexual abuse Neg Hx    Physical abuse Neg Hx       Review of Systems: All other systems reviewed and are otherwise negative except as noted above.  Physical Exam: Vitals:   03/23/19 1045 03/23/19 1050 03/23/19 1055 03/23/19 1108  BP: (!) 154/81 126/74 (!) 151/64 140/63  Pulse: 82 82    Resp: 11 13 12 18   Temp:   98 F (36.7 C)   TempSrc:   Temporal   SpO2: 98% 96% 98% 95%  Weight:      Height:        GEN- The patient is well appearing, alert and oriented x 3 today.   Head- normocephalic, atraumatic Eyes-  Sclera clear, conjunctiva pink Ears- hearing intact Oropharynx- clear Neck- supple Lungs- Clear to ausculation bilaterally, normal work of breathing Heart- Regular rate and rhythm, no murmurs, rubs or gallops  GI- soft, NT, ND, + BS Extremities- no clubbing, cyanosis, or edema MS- no significant deformity or atrophy Skin- no rash or  lesion Psych- euthymic mood, full affect   Labs:   Lab Results  Component Value Date   WBC 10.6 (H) 03/20/2019   HGB 14.3 03/20/2019   HCT 42.0 03/20/2019   MCV 91.6 03/20/2019   PLT 226 03/20/2019    Recent Labs  Lab 03/20/19 1348 03/20/19 1407  NA 136 135  K 4.2 4.2  CL 101 102  CO2 25  --   BUN 19 19  CREATININE 0.72 0.80  CALCIUM 8.9  --   PROT 6.9  --   BILITOT 0.6  --   ALKPHOS 66  --   ALT 12  --   AST 19  --   GLUCOSE 115* 113*     Radiology/Studies: Ct Angio Head W Or Wo Contrast  Result Date: 03/21/2019 CLINICAL DATA:  Stroke, follow-up. EXAM: CT ANGIOGRAPHY HEAD AND NECK TECHNIQUE: Multidetector CT imaging of the head and neck was performed using the standard protocol during bolus administration of intravenous contrast. Multiplanar CT image reconstructions and MIPs were obtained to evaluate the vascular anatomy. Carotid stenosis measurements (when applicable) are obtained utilizing NASCET criteria, using the distal internal carotid diameter as the denominator. CONTRAST:  42mL OMNIPAQUE IOHEXOL 350 MG/ML SOLN COMPARISON:  CT head without contrast 03/20/2019. FINDINGS: CTA NECK FINDINGS Aortic arch: Atherosclerotic calcifications are present at the origin of the left subclavian artery without a significant stenosis. Additional calcifications are present in the distal aortic arch and descending thoracic aorta without aneurysm. Great vessel origins are otherwise within normal limits. Right carotid system: The right common carotid artery is within normal limits. Calcifications are present at the right  carotid bifurcation without a significant stenosis. There is moderate tortuosity in the proximal cervical right ICA without a significant stenosis. Left carotid system: The left common carotid artery is within normal limits. Bifurcation is unremarkable. There is moderate tortuosity of the cervical left ICA without a significant stenosis. Vertebral arteries: The vertebral  arteries originate from the subclavian arteries bilaterally. The right vertebral artery is dominant. There is no significant stenosis of either vertebral artery in the neck. Skeleton: Levoconvex curvature of the cervical spine is centered at C4. Grade 1 anterolisthesis is present at C3-4. There is chronic loss of disc height and endplate change at D34-534, C5-6, and C6-7. No focal lytic or blastic lesions are present. Other neck: No focal mucosal or submucosal lesions are present. Nasopharynx is unremarkable. Soft palate and tongue base are normal. Salivary glands are within normal limits. No significant adenopathy is present. Vocal cords are midline and symmetric. Thyroid is normal. Upper chest: Scattered ground-glass attenuation is present without focal consolidation. There is no nodule or mass lesion. No pneumothorax is present. The thoracic inlet is normal. There is mild prominence pericardial fluid. Review of the MIP images confirms the above findings CTA HEAD FINDINGS Anterior circulation: Minimal atherosclerotic changes are present within the cavernous left internal carotid artery. There is no significant stenosis of either carotid artery through the ICA terminus. The A1 and M1 segments are normal. The anterior communicating artery is patent. There is no significant proximal stenosis or occlusion. There is some asymmetric vascularity over the posterior left frontal and parietal lobe without cortical gray matter differentiation loss. Posterior circulation: The right vertebral artery is the dominant vessel. PICA origins are visualized and normal. Vertebrobasilar junction is normal. The basilar artery is unremarkable. Both posterior cerebral arteries originate from the basilar tip. Mild irregularity is present in the posterior cerebral arteries without a significant stenosis. Venous sinuses: The dural sinuses are patent. Straight sinus deep cerebral veins are intact. Right transverse sinus is dominant. Cortical  veins are unremarkable. Anatomic variants: None Review of the MIP images confirms the above findings IMPRESSION: 1. No emergent large vessel occlusion. 2. Asymmetric prominence of MCA branch vessels over the left posterior frontal and parietal lobes. This may represent luxury perfusion following TIA or infarct. Gray-white differentiation is preserved without evidence of a significant cortical infarct. 3. Mild atherosclerotic changes at the aortic arch and right carotid bifurcation without significant stenosis. 4. Multilevel degenerative changes in the cervical spine as described. 5. Scattered ground-glass attenuation lung apices likely represents atelectasis or edema. Electronically Signed   By: San Morelle M.D.   On: 03/21/2019 11:05   Ct Angio Neck W Or Wo Contrast  Result Date: 03/21/2019 CLINICAL DATA:  Stroke, follow-up. EXAM: CT ANGIOGRAPHY HEAD AND NECK TECHNIQUE: Multidetector CT imaging of the head and neck was performed using the standard protocol during bolus administration of intravenous contrast. Multiplanar CT image reconstructions and MIPs were obtained to evaluate the vascular anatomy. Carotid stenosis measurements (when applicable) are obtained utilizing NASCET criteria, using the distal internal carotid diameter as the denominator. CONTRAST:  63mL OMNIPAQUE IOHEXOL 350 MG/ML SOLN COMPARISON:  CT head without contrast 03/20/2019. FINDINGS: CTA NECK FINDINGS Aortic arch: Atherosclerotic calcifications are present at the origin of the left subclavian artery without a significant stenosis. Additional calcifications are present in the distal aortic arch and descending thoracic aorta without aneurysm. Great vessel origins are otherwise within normal limits. Right carotid system: The right common carotid artery is within normal limits. Calcifications are present at the right  carotid bifurcation without a significant stenosis. There is moderate tortuosity in the proximal cervical right ICA  without a significant stenosis. Left carotid system: The left common carotid artery is within normal limits. Bifurcation is unremarkable. There is moderate tortuosity of the cervical left ICA without a significant stenosis. Vertebral arteries: The vertebral arteries originate from the subclavian arteries bilaterally. The right vertebral artery is dominant. There is no significant stenosis of either vertebral artery in the neck. Skeleton: Levoconvex curvature of the cervical spine is centered at C4. Grade 1 anterolisthesis is present at C3-4. There is chronic loss of disc height and endplate change at D34-534, C5-6, and C6-7. No focal lytic or blastic lesions are present. Other neck: No focal mucosal or submucosal lesions are present. Nasopharynx is unremarkable. Soft palate and tongue base are normal. Salivary glands are within normal limits. No significant adenopathy is present. Vocal cords are midline and symmetric. Thyroid is normal. Upper chest: Scattered ground-glass attenuation is present without focal consolidation. There is no nodule or mass lesion. No pneumothorax is present. The thoracic inlet is normal. There is mild prominence pericardial fluid. Review of the MIP images confirms the above findings CTA HEAD FINDINGS Anterior circulation: Minimal atherosclerotic changes are present within the cavernous left internal carotid artery. There is no significant stenosis of either carotid artery through the ICA terminus. The A1 and M1 segments are normal. The anterior communicating artery is patent. There is no significant proximal stenosis or occlusion. There is some asymmetric vascularity over the posterior left frontal and parietal lobe without cortical gray matter differentiation loss. Posterior circulation: The right vertebral artery is the dominant vessel. PICA origins are visualized and normal. Vertebrobasilar junction is normal. The basilar artery is unremarkable. Both posterior cerebral arteries originate  from the basilar tip. Mild irregularity is present in the posterior cerebral arteries without a significant stenosis. Venous sinuses: The dural sinuses are patent. Straight sinus deep cerebral veins are intact. Right transverse sinus is dominant. Cortical veins are unremarkable. Anatomic variants: None Review of the MIP images confirms the above findings IMPRESSION: 1. No emergent large vessel occlusion. 2. Asymmetric prominence of MCA branch vessels over the left posterior frontal and parietal lobes. This may represent luxury perfusion following TIA or infarct. Gray-white differentiation is preserved without evidence of a significant cortical infarct. 3. Mild atherosclerotic changes at the aortic arch and right carotid bifurcation without significant stenosis. 4. Multilevel degenerative changes in the cervical spine as described. 5. Scattered ground-glass attenuation lung apices likely represents atelectasis or edema. Electronically Signed   By: San Morelle M.D.   On: 03/21/2019 11:05   Ct Thoracic Spine Wo Contrast  Result Date: 03/05/2019 CLINICAL DATA:  Fusion surgery July 2019.  Mid to low back pain. EXAM: CT THORACIC SPINE WITHOUT CONTRAST TECHNIQUE: Multidetector CT images of the thoracic were obtained using the standard protocol without intravenous contrast. COMPARISON:  Radiography 01/30/2017.  CT chest 11/14/2016. FINDINGS: Alignment: Normal Vertebrae: No vertebral body abnormality from T1 through T9. Lower cervical region shows degenerative spondylosis. Fusion procedure beginning at T10 extending into the lumbar region. Pedicle screws and posterior rods with posterior fusion bone. Pedicle screws are well positioned. No rod disconnection. No evidence of screw loosening or unintended fracture. Canal and foramina appear widely patent through the region. Paraspinal and other soft tissues: Otherwise negative. Aortic atherosclerosis. Disc levels: No significant degenerative disc disease in the  region. No significant facet arthropathy. IMPRESSION: Thoracolumbar fusion beginning at T10. No hardware malposition, loosening or other complication in the  thoracic region. No evidence of any discernible adjacent segment changes at this time. Upper thoracic region is negative Electronically Signed   By: Nelson Chimes M.D.   On: 03/05/2019 13:41   Ct Lumbar Spine Wo Contrast  Result Date: 03/05/2019 CLINICAL DATA:  Fusion surgery 2019.  Back pain. EXAM: CT LUMBAR SPINE WITHOUT CONTRAST TECHNIQUE: Multidetector CT imaging of the lumbar spine was performed without intravenous contrast administration. Multiplanar CT image reconstructions were also generated. COMPARISON:  Radiography 12/17/2018.  MRI 02/20/2017. FINDINGS: Segmentation: As described previously, S1 is lumbarized. Fusion procedure extends through the S1 level. Alignment: Curvature convex to the right in the thoracolumbar region and to the left in the lumbosacral region. Anterolisthesis at L5-S1 of 11 mm. Anterolisthesis at S1-2 of 2 mm, which would probably worsen with standing or flexion. Vertebrae: Mild subsidence at the L4-5 level. Pronounced subsidence at L5-S1 with fracture of the S1 vertebra as described below. Paraspinal and other soft tissues: Aortic atherosclerosis. Otherwise negative. Disc levels: No significant finding at L1-2 or L2-3. No hardware malposition or complication. L3-4: Mild disc degeneration and bulging of the disc with small left-sided osteophytes. No compressive stenosis. No hardware complication or malposition. L4-5: Previous posterior decompression, diskectomy and interbody spacers. Mild subsidence particularly at the superior endplate of L5. No evidence of screw loosening or malposition at this level. Sufficient patency of the canal and foramina. L5-S1: Fracture of the S1 vertebra pronounced subsidence of the interbody spacers. Loosening of the S1 screws. Anterolisthesis of 11 mm at this level. Severe stenosis of the canal,  lateral recesses and neural foramina. S1-2: Advanced bilateral facet arthropathy with 2 mm of anterolisthesis that could worsen with standing or flexion. Bulging of the disc. Stenosis of the lateral recesses and neural foramina that could cause neural compression on either or both sides. Sacroiliac joints show mild osteoarthritis. No evidence of fracture of the main body of the sacrum beginning at S2. IMPRESSION: No significant finding at L4-5 or above. Fracture of the S1 vertebra with spacer subsidence. Loosening of the pedicle screws of S1. 11 mm of anterolisthesis at L5-S1. Severe stenosis of the canal, lateral recesses and foramina. L5-S1 facet arthropathy with 2 mm of anterolisthesis. Shallow disc protrusion. Stenosis of the lateral recesses and neural foramina that could cause neural compression at this level as well. Electronically Signed   By: Nelson Chimes M.D.   On: 03/05/2019 13:51   Mr Brain Wo Contrast  Result Date: 03/21/2019 CLINICAL DATA:  Focal neuro deficit, > 6 hrs, stroke suspected. Confusion. Right-sided weakness. EXAM: MRI HEAD WITHOUT CONTRAST TECHNIQUE: Multiplanar, multiecho pulse sequences of the brain and surrounding structures were obtained without intravenous contrast. COMPARISON:  CT head without contrast 03/20/2019. CTA of the head and neck 03/21/2019. FINDINGS: Brain: The diffusion-weighted images demonstrate acute nonhemorrhagic infarct involving the left precentral gyrus measuring 1.5 cm cephalo caudad. A punctate area of restricted cortical diffusion is also present more posteriorly in the left parietal lobe. T2 signal changes are associated with the area of acute infarction. T2 signal changes are associated with the area of acute infarction. There are extensive periventricular and subcortical T2 changes bilaterally consistent with diffuse remote ischemia. Dilated perivascular spaces are present within the basal ganglia. Brainstem is normal. A single punctate remote lacunar  infarct is present in the right cerebellum. The internal auditory canals are within normal limits. Vascular: Flow is present in the major intracranial arteries. Skull and upper cervical spine: Grade 1 anterolisthesis is again noted at C2-3 and C3-4. Advanced  degenerative changes are noted at C4-5. Craniocervical junction is otherwise normal. Midline structures are normal. Sinuses/Orbits: The paranasal sinuses and mastoid air cells are clear. The globes and orbits are within normal limits. IMPRESSION: 1. 15 mm area of acute/subacute nonhemorrhagic infarct involving the left precentral gyrus, consistent with the patient's right upper extremity weakness. 2. Additional punctate area of acute/subacute nonhemorrhagic infarct involving the left parietal lobe. 3. Diffuse advanced periventricular and subcortical white matter changes bilaterally reflect the sequela of chronic microvascular ischemia and multiple remote white matter infarcts. 4. Degenerative changes of the cervical spine. Electronically Signed   By: San Morelle M.D.   On: 03/21/2019 12:48   Ct Head Code Stroke Wo Contrast  Result Date: 03/20/2019 CLINICAL DATA:  Code stroke. Focal neuro deficit, > 6 hrs, stroke suspected. Right-sided weakness and facial droop EXAM: CT HEAD WITHOUT CONTRAST TECHNIQUE: Contiguous axial images were obtained from the base of the skull through the vertex without intravenous contrast. COMPARISON:  CT head without contrast 08/01/2011 FINDINGS: Brain: Extensive periventricular and subcortical white matter hypoattenuation has progressed since the prior exam. Basal ganglia are intact the insular cortex is intact. Gray-white differentiation is preserved. No focal cortical infarct is present. Brainstem and cerebellum are normal. The ventricles are of normal size. No significant extraaxial fluid collection is present. Vascular: Atherosclerotic changes are present within the cavernous internal carotid arteries. There is no  hyperdense vessel. Skull: Calvarium is intact. No focal lytic or blastic lesions are present. Sinuses/Orbits: The paranasal sinuses and mastoid air cells are clear. The globes and orbits are within normal limits. ASPECTS [facility] Stroke Program Early CT Score) - Ganglionic level infarction (caudate, lentiform nuclei, internal capsule, insula, M1-M3 cortex): 7/7 - Supraganglionic infarction (M4-M6 cortex): 3/3 Total score (0-10 with 10 being normal): 10/10 IMPRESSION: 1. No acute cortical or basal ganglia infarct. 2. Progression of diffuse white matter disease. Acute or subacute white matter infarct is not excluded. 3. Atherosclerosis 4. ASPECTS is 10/10 These results were called by telephone at the time of interpretation on 03/20/2019 at 2:30 pm to Dr. Verneda Skill, who verbally acknowledged these results. Electronically Signed   By: San Morelle M.D.   On: 03/20/2019 14:31    12-lead ECG SR, rate 89, PVC (personally reviewed) All prior EKG's in EPIC reviewed with no documented atrial fibrillation  Telemetry SR (personally reviewed)  Assessment and Plan:  1. Cryptogenic stroke The patient presents with cryptogenic stroke. With newly identified depressed EF, would recommend 30 day monitor prior to ILR implant. Discussed with Dr Haroldine Laws. Will arrange 30 day monitor with our office.  EP will see in 6 weeks to review monitor results.   Please call with questions.   Chanetta Marshall, NP 03/23/2019 11:24 AM  I have seen, examined the patient, and reviewed the above assessment and plan.  Changes to above are made where necessary.  On exam, RRR. Give reduced EF, she does not fit our cryptogenic stroke protocol currently.  30 day monitor and follow-up with EP APP is advised as above.  Co Sign: Thompson Grayer, MD

## 2019-03-23 NOTE — Progress Notes (Signed)
PROGRESS NOTE    Amy [name]  [id number] DOB: 02/17/1936 DOA: 03/20/2019 PCP: Sinda Du, MD    Brief Narrative:  83 year old female with history of diabetes, anxiety and depression, invasive ductal carcinoma of the right breast s/p mastectomy and now on Femara and arthritis presented to the emergency room with right arm weakness since morning of the day of admission.  She had progressive weakness that prompted her to visit to the ER.  In the emergency room patient was found with right hemiplegia and acute stroke.  Head CT showed no acute process.  Outside TPA window.  Transferred to [facility] for further management, neurology consult.   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  CT head findings, initial CT scan was without any acute findings. MRI of the brain, left MCA territory stroke.  CTA of the head and neck shows no emergent large vessel occlusion.  Mild atherosclerotic changes. 2D echocardiogram, ef 35%.  No clinical evidence of congestive heart failure or fluid overload.  Denies any history of coronary artery disease or congestive heart failure.  Euvolemic. Antiplatelet therapy, patient on aspirin 325 at home continued.  Dual antiplatelet therapy for 3 weeks and continue Plavix alone. LDL is 152 patient not on any statin at home.  Started on atorvastatin 40 mg. Hemoglobin A1c, 6.3.  Currently on insulin.  On metformin at home.  Her blood sugars are acceptable on her long-term metformin therapy.  Will resume on discharge. Therapy recommendations, acute inpatient rehab Secondary risk factor modification including Hypertension,  blood pressure on goal Cardiology consulted for loop recorder and TEE.  Type 2 diabetes: On metformin at home.  On hold.  Currently remains on insulin.  A1c 6.3   Depression and anxiety: On multiple medications including gabapentin that she will continue.  Chronic congestive heart failure: With no history of coronary artery disease or history of congestive heart failure.  Currently euvolemic.  TEE today. Cardio consulted.    DVT prophylaxis: SCDs Code Status: Full code Family Communication: None.  Disposition Plan: Acute inpatient rehab.  When available.   Consultants:   Neurology  Cardiology.  Procedures:   TEE  Antimicrobials:   None   Subjective: Patient seen and examined.  No overnight events.  She was slightly anxious and frustrated being in the hospital.  She was trying to use her right hand as much as possible.   Objective: Vitals:   03/23/19 1045 03/23/19 1050 03/23/19 1055 03/23/19 1108  BP: (!) 154/81 126/74 (!) 151/64 140/63  Pulse: 82 82    Resp: 11 13 12 18   Temp:   98 F (36.7 C)   TempSrc:   Temporal   SpO2: 98% 96% 98% 95%  Weight:      Height:        Intake/Output Summary (Last 24 hours) at 03/23/2019 1127 Last data filed at 03/23/2019 0840 Gross per 24 hour  Intake 962.99 ml  Output 1500 ml  Net -537.01 ml   Filed Weights   03/20/19 1351 03/20/19 2105 03/20/19 2251  Weight: 109.7 kg 110 kg 77.4 kg    Examination:  General exam: Appears calm and comfortable. Respiratory system: Clear to auscultation. Respiratory effort normal.  Cardiovascular system: S1 & S2 heard, RRR. No JVD, murmurs, rubs, gallops or clicks. No pedal edema. Gastrointestinal system: Abdomen is nondistended, soft and nontender. No organomegaly or masses felt. Normal bowel sounds heard. Central nervous system: Alert and oriented.  Cranial nerves are normal. Extremities: Right upper extremity, proximal 3/5.  Distal 4/5.  Right lower extremity 4/5.  Left upper and lower  extremity normal. Skin: No rashes, lesions or ulcers Psychiatry: Judgement and insight appear normal. Mood & affect appropriate.  Anxious today.    Data Reviewed: I have personally reviewed following labs and imaging studies  CBC: Recent Labs  Lab 03/20/19 1348 03/20/19 1407  WBC 10.6*  --   NEUTROABS 8.6*  --   HGB 14.3 14.3  HCT 43.9 42.0  MCV 91.6  --   PLT 226  --    Basic Metabolic Panel: Recent Labs  Lab 03/20/19 1348 03/20/19 1407  NA 136 135  K 4.2 4.2  CL 101 102  CO2 25  --   GLUCOSE 115* 113*  BUN 19 19  CREATININE 0.72 0.80  CALCIUM 8.9  --    GFR: Estimated Creatinine Clearance: 55.5 mL/min (by C-G formula based on SCr of 0.8 mg/dL). Liver Function Tests: Recent Labs  Lab 03/20/19 1348  AST 19  ALT 12  ALKPHOS 66  BILITOT 0.6  PROT 6.9  ALBUMIN 3.9   No results for input(s): LIPASE, AMYLASE in the last 168 hours. No results for input(s): AMMONIA in the last 168 hours. Coagulation Profile: Recent Labs  Lab 03/20/19 1348  INR 1.0   Cardiac Enzymes: No results for input(s): CKTOTAL, CKMB, CKMBINDEX, TROPONINI in the last 168 hours. BNP (last 3 results) No results for input(s): PROBNP in the last 8760 hours. HbA1C: Recent Labs    03/21/19 0647  HGBA1C 6.3*   CBG: Recent Labs  Lab 03/22/19 0623 03/22/19 1202 03/22/19 1603 03/22/19 2122 03/23/19 0603  GLUCAP 151* 118* 113* 129* 131*   Lipid Profile: Recent Labs    03/21/19 0647  CHOL 264*  HDL 76  LDLCALC 153*  TRIG 174*  CHOLHDL 3.5   Thyroid Function Tests: No results for input(s): TSH, T4TOTAL, FREET4, T3FREE, THYROIDAB in the last 72 hours. Anemia Panel: No results for input(s): VITAMINB12, FOLATE, FERRITIN, TIBC, IRON, RETICCTPCT in the last 72 hours. Sepsis Labs: No results for input(s): PROCALCITON, LATICACIDVEN in the last 168 hours.  Recent Results (from the past 240 hour(s))  SARS Coronavirus 2 [facility] order, Performed in [facility] hospital lab)  Nasopharyngeal Nasopharyngeal Swab     Status: None   Collection Time: 03/20/19  3:17 PM   Specimen: Nasopharyngeal Swab  Result Value Ref Range Status   SARS Coronavirus 2 NEGATIVE NEGATIVE Final    Comment: (NOTE) If result is NEGATIVE SARS-CoV-2 target nucleic acids are NOT DETECTED. The SARS-CoV-2 RNA is generally detectable in upper and lower  respiratory specimens during the acute phase of infection. The lowest  concentration of SARS-CoV-2 viral copies this assay can detect is 250  copies / mL. A negative result does not preclude SARS-CoV-2 infection  and should not be used as the sole basis for treatment or other  patient management decisions.  A negative result may occur with  improper specimen collection / handling, submission of specimen other  than nasopharyngeal swab, presence of viral mutation(s) within the  areas targeted by this assay, and inadequate number of viral copies  (<250 copies / mL). A negative result must be combined with clinical  observations,  patient history, and epidemiological information. If result is POSITIVE SARS-CoV-2 target nucleic acids are DETECTED. The SARS-CoV-2 RNA is generally detectable in upper and lower  respiratory specimens dur ing the acute phase of infection.  Positive  results are indicative of active infection with SARS-CoV-2.  Clinical  correlation with patient history and other diagnostic information is  necessary to determine patient infection status.  Positive results do  not rule out bacterial infection or co-infection with other viruses. If result is PRESUMPTIVE POSTIVE SARS-CoV-2 nucleic acids MAY BE PRESENT.   A presumptive positive result was obtained on the submitted specimen  and confirmed on repeat testing.  While 2019 novel coronavirus  (SARS-CoV-2) nucleic acids may be present in the submitted sample  additional confirmatory testing may be necessary for epidemiological  and / or clinical management purposes  to  differentiate between  SARS-CoV-2 and other Sarbecovirus currently known to infect humans.  If clinically indicated additional testing with an alternate test  methodology [phone]) is advised. The SARS-CoV-2 RNA is generally  detectable in upper and lower respiratory sp ecimens during the acute  phase of infection. The expected result is Negative. Fact Sheet for Patients:  StrictlyIdeas.no Fact Sheet for Healthcare Providers: BankingDealers.co.za This test is not yet approved or cleared by the [place] FDA and has been authorized for detection and/or diagnosis of SARS-CoV-2 by FDA under an Emergency Use Authorization (EUA).  This EUA will remain in effect (meaning this test can be used) for the duration of the COVID-19 declaration under Section 564(b)(1) of the Act, 21 U.S.C. section 360bbb-3(b)(1), unless the authorization is terminated or revoked sooner. Performed at [facility], [address], [place], [place] [address]          Radiology Studies: Mr Brain Wo Contrast  Result Date: 03/21/2019 CLINICAL DATA:  Focal neuro deficit, > 6 hrs, stroke suspected. Confusion. Right-sided weakness. EXAM: MRI HEAD WITHOUT CONTRAST TECHNIQUE: Multiplanar, multiecho pulse sequences of the brain and surrounding structures were obtained without intravenous contrast. COMPARISON:  CT head without contrast 03/20/2019. CTA of the head and neck 03/21/2019. FINDINGS: Brain: The diffusion-weighted images demonstrate acute nonhemorrhagic infarct involving the left precentral gyrus measuring 1.5 cm cephalo caudad. A punctate area of restricted cortical diffusion is also present more posteriorly in the left parietal lobe. T2 signal changes are associated with the area of acute infarction. T2 signal changes are associated with the area of acute infarction. There are extensive periventricular and subcortical T2 changes bilaterally consistent with diffuse remote  ischemia. Dilated perivascular spaces are present within the basal ganglia. Brainstem is normal. A single punctate remote lacunar infarct is present in the right cerebellum. The internal auditory canals are within normal limits. Vascular: Flow is present in the major intracranial arteries. Skull and upper cervical spine: Grade 1 anterolisthesis is again noted at C2-3 and C3-4. Advanced degenerative changes are noted at C4-5. Craniocervical junction is otherwise normal. Midline structures are normal. Sinuses/Orbits: The paranasal sinuses and mastoid air cells are clear. The globes and orbits are within normal limits. IMPRESSION: 1. 15 mm area of acute/subacute nonhemorrhagic infarct involving the left precentral gyrus, consistent with the patient's right upper extremity weakness. 2. Additional punctate area of acute/subacute nonhemorrhagic infarct involving the left parietal lobe. 3. Diffuse advanced periventricular and subcortical white matter changes bilaterally reflect the sequela of chronic microvascular ischemia and multiple remote white matter infarcts. 4. Degenerative changes of the cervical spine. Electronically Signed   By: San Morelle M.D.   On: 03/21/2019 12:48  Scheduled Meds: . aspirin  81 mg Oral Q breakfast  . atorvastatin  40 mg Oral q1800  . clopidogrel  75 mg Oral Daily  . enoxaparin (LOVENOX) injection  40 mg Subcutaneous Q24H  . escitalopram  20 mg Oral Daily  . gabapentin  300 mg Oral QHS  . insulin aspart  0-15 Units Subcutaneous TID WC  . insulin aspart  0-5 Units Subcutaneous QHS  . insulin glargine  15 Units Subcutaneous QHS  . letrozole  2.5 mg Oral Daily  . risperiDONE  1 mg Oral QHS  . traZODone  50 mg Oral QHS  . venlafaxine XR  150 mg Oral Q breakfast   Continuous Infusions:   LOS: 3 days    Time spent: 25 minutes    Barb Merino, MD [facility] Pager [phone]  If 7PM-7AM, please contact night-coverage [email or web address] Password  [facility] 03/23/2019, 11:27 AM

## 2019-03-23 NOTE — Progress Notes (Addendum)
PT Cancellation Note  Patient Details Name: AYZA DELICH MRN: [id number] DOB: Dec 05, 1935   Cancelled Treatment:    Reason Eval/Treat Not Completed: Patient at procedure or test/unavailable.  Pt is getting TEE.  PT to check back later as time allows.  Thanks,  Barbarann Ehlers. Lorah Kalina, PT, DPT  Acute Rehabilitation [phone] pager [phone] office  @ [facility]: [phone]     Harvie Heck 03/23/2019, 9:45 AM

## 2019-03-23 NOTE — Progress Notes (Signed)
Physical Therapy Treatment Patient Details Name: Amy [name] MRN: [id number] DOB: July 07, 1936 Today's Date: 03/23/2019    History of Present Illness 83 y.o. female with a history of diabetes, anxiety and depression, invasive ductal carcinoma of the right breast on Femara, arthritis.  Patient seen for right arm weakness and unwitnessed fall. CT negative, MRI head - small infarcts at left precentral gyrus and left superior parietal lobe.     PT Comments    Pt was able to get up and ambulate a short distance around the room before fatigue and increased instability at right leg.  She needed manual assist to keep R hand on RW and to steer RW.  Some R sided inattention noted.  PT will continue to follow acutely for safe mobility progression  Follow Up Recommendations  SNF(CIR denied)     Equipment Recommendations  None recommended by PT    Recommendations for Other Services   NA     Precautions / Restrictions Precautions Precautions: Fall Precaution Comments: R sided weakness UE>LE Restrictions Weight Bearing Restrictions: No    Mobility  Bed Mobility Overal bed mobility: Needs Assistance Bed Mobility: Supine to Sit     Supine to sit: Min assist;HOB elevated     General bed mobility comments: Min assist to support trunk to come to sitting EOB.  Pt able to progress bil LEs over to EOB without assistance.    Transfers Overall transfer level: Needs assistance Equipment used: Rolling walker (2 wheeled) Transfers: Sit to/from Stand Sit to Stand: Min assist         General transfer comment: Min assist to stand from bed.  Assist needed for R hand placement on RW. Pt would benefit from a hand/wrist strap to keep her R hand on RW as therapist had to hold it there.   Ambulation/Gait Ambulation/Gait assistance: Min assist Gait Distance (Feet): 15 Feet Assistive device: Rolling walker (2 wheeled) Gait Pattern/deviations: Step-through pattern;Decreased stance time -  right;Decreased dorsiflexion - right     General Gait Details: Pt with R foot drag and as she fatigued signs of instability at right knee.  Manual assist to steer and keep R hand on RW during gait.        Modified Rankin (Stroke Patients Only) Modified Rankin (Stroke Patients Only) Pre-Morbid Rankin Score: No symptoms Modified Rankin: Moderately severe disability     Balance Overall balance assessment: Needs assistance Sitting-balance support: Feet supported;Single extremity supported Sitting balance-Leahy Scale: Fair Sitting balance - Comments: close supervision EOB for static sitting.   Standing balance support: Bilateral upper extremity supported;During functional activity Standing balance-Leahy Scale: Poor Standing balance comment: needs external support from RW and therapist in standing.                             Cognition Arousal/Alertness: Awake/alert Behavior During Therapy: WFL for tasks assessed/performed Overall Cognitive Status: Impaired/Different from baseline Area of Impairment: Memory;Safety/judgement;Attention;Awareness;Problem solving                   Current Attention Level: Selective Memory: Decreased short-term memory(does not remember working with therapy in previous sessions) Following Commands: Follows one step commands consistently Safety/Judgement: Decreased awareness of deficits Awareness: Emergent Problem Solving: Difficulty sequencing;Requires verbal cues;Requires tactile cues General Comments: Pt keeps telling me (every session) that she has not been up out of the bed.  I remind her that she got up Sunday with PT/OT and that she refused to get up  with me yesterday.  She does not remeber these events.              Pertinent Vitals/Pain Pain Assessment: No/denies pain           PT Goals (current goals can now be found in the care plan section) Acute Rehab PT Goals Patient Stated Goal: to walk Progress towards PT  goals: Progressing toward goals    Frequency    Min 3X/week      PT Plan Discharge plan needs to be updated;Frequency needs to be updated       AM-PAC PT "6 Clicks" Mobility   Outcome Measure  Help needed turning from your back to your side while in a flat bed without using bedrails?: A Little Help needed moving from lying on your back to sitting on the side of a flat bed without using bedrails?: A Little Help needed moving to and from a bed to a chair (including a wheelchair)?: A Little Help needed standing up from a chair using your arms (e.g., wheelchair or bedside chair)?: A Little Help needed to walk in hospital room?: A Little Help needed climbing 3-5 steps with a railing? : A Lot 6 Click Score: 17    End of Session Equipment Utilized During Treatment: Gait belt Activity Tolerance: Patient limited by fatigue Patient left: in chair;with call bell/phone within reach;with chair alarm set   PT Visit Diagnosis: Difficulty in walking, not elsewhere classified (R26.2);Other symptoms and signs involving the nervous system [id number])     Time: [id number] PT Time Calculation (min) (ACUTE ONLY): 24 min  Charges:  $Gait Training: 8-22 mins $Therapeutic Activity: 8-22 mins             Rielle Schlauch B. Tagen Brethauer, PT, DPT  Acute Rehabilitation [phone] pager [phone] office  @ [facility]: [phone]            03/23/2019, 5:30 PM

## 2019-03-24 ENCOUNTER — Ambulatory Visit (HOSPITAL_COMMUNITY): Payer: Medicare HMO

## 2019-03-24 ENCOUNTER — Encounter (HOSPITAL_COMMUNITY): Payer: Self-pay

## 2019-03-24 ENCOUNTER — Encounter (HOSPITAL_COMMUNITY): Payer: Self-pay | Admitting: Internal Medicine

## 2019-03-24 LAB — GLUCOSE, CAPILLARY
Glucose-Capillary: 123 mg/dL — ABNORMAL HIGH (ref 70–99)
Glucose-Capillary: 122 mg/dL — ABNORMAL HIGH (ref 70–99)
Glucose-Capillary: 247 mg/dL — ABNORMAL HIGH (ref 70–99)
Glucose-Capillary: 84 mg/dL (ref 70–99)
Glucose-Capillary: 106 mg/dL — ABNORMAL HIGH (ref 70–99)

## 2019-03-24 NOTE — Progress Notes (Signed)
Occupational Therapy Treatment Patient Details Name: Amy [name] MRN: [id number] DOB: [birth year] Today's Date: 03/24/2019    History of present illness 83 y.o. female with a history of diabetes, anxiety and depression, invasive ductal carcinoma of the right breast on Femara, arthritis.  Patient seen for right arm weakness and unwitnessed fall. CT negative, MRI head - small infarcts at left precentral gyrus and left superior parietal lobe.    OT comments  Pt supine in bed with family member present in the room. Pt is agreeable to OT intervention and requesting assistance to wash hair. Pt transferred from bed > recliner chair with use of RW and min A for RW advancement and balance. Pt having difficulty holding RW with R UE and may benefit from wrist strap. Pt washing hair , drying hair, and combing hair with B UEs this session. No drops of materials from R hand but did exhibit increased effort to utilize. Pt remained in recliner chair with chair alarm activated and family still in room. Pt continues to benefit from OT intervention.   Follow Up Recommendations  SNF (declined CIR)   Equipment Recommendations  Other (comment)(defer to next venue of care)       Precautions / Restrictions Precautions Precautions: Fall Precaution Comments: R sided weakness UE>LE Restrictions Weight Bearing Restrictions: No       Mobility Bed Mobility Overal bed mobility: Needs Assistance Bed Mobility: Supine to Sit Rolling: Min assist       Transfers Overall transfer level: Needs assistance Equipment used: Rolling walker (2 wheeled) Transfers: Sit to/from Stand Sit to Stand: Min assist         General transfer comment: min A to power up from bed with min cuing for hand placement and assistance to hold R UE onto RW secondary to decreased attention and strength    Balance Overall balance assessment: Needs assistance Sitting-balance support: Feet supported;Single extremity supported Sitting  balance-Leahy Scale: Fair Sitting balance - Comments: close supervision EOB   Standing balance support: Bilateral upper extremity supported;During functional activity Standing balance-Leahy Scale: Poor Standing balance comment: needs external support from RW and therapist in standing.             ADL either performed or assessed with clinical judgement   ADL        General ADL Comments: Mod cuing to attend to R UE and use while washing hair and combing hair this session.               Cognition Arousal/Alertness: Awake/alert Behavior During Therapy: WFL for tasks assessed/performed Overall Cognitive Status: Impaired/Different from baseline Area of Impairment: Memory;Safety/judgement;Attention;Awareness;Problem solving         Current Attention Level: Selective Memory: Decreased short-term memory Following Commands: Follows one step commands consistently Safety/Judgement: Decreased awareness of deficits Awareness: Emergent Problem Solving: Difficulty sequencing;Requires verbal cues;Requires tactile cues                     Pertinent Vitals/ Pain       Pain Assessment: Faces Faces Pain Scale: No hurt         Frequency  Min 2X/week        Progress Toward Goals  OT Goals(current goals can now be found in the care plan section)  Progress towards OT goals: Progressing toward goals  Acute Rehab OT Goals Patient Stated Goal: to use R side better OT Goal Formulation: With patient/family Time For Goal Achievement: 04/07/19 Potential to Achieve Goals: Good  Plan Discharge  plan remains appropriate       AM-PAC OT "6 Clicks" Daily Activity     Outcome Measure   Help from another person eating meals?: A Little Help from another person taking care of personal grooming?: A Little Help from another person toileting, which includes using toliet, bedpan, or urinal?: A Lot Help from another person bathing (including washing, rinsing, drying)?: A Lot Help  from another person to put on and taking off regular upper body clothing?: A Lot Help from another person to put on and taking off regular lower body clothing?: A Lot 6 Click Score: 14    End of Session Equipment Utilized During Treatment: Rolling walker  OT Visit Diagnosis: Other abnormalities of gait and mobility (R26.89);Other symptoms and signs involving the nervous system (R29.898);Hemiplegia and hemiparesis Hemiplegia - Right/Left: Right Hemiplegia - dominant/non-dominant: Dominant   Activity Tolerance Patient tolerated treatment well   Patient Left with call bell/phone within reach;in chair;with chair alarm set;with family/visitor present   Nurse Communication          Time: 1433-1500 OT Time Calculation (min): 27 min  Charges: OT General Charges $OT Visit: 1 Visit OT Treatments $Self Care/Home Management : 23-37 mins   Sohail Capraro P, MS, OTR/L 03/24/2019, 3:18 PM

## 2019-03-24 NOTE — Progress Notes (Signed)
PROGRESS NOTE    Amy [name]  [id number] DOB: 1935/11/08 DOA: 03/20/2019 PCP: Sinda Du, MD    Brief Narrative:  83 year old female with history of diabetes, anxiety and depression, invasive ductal carcinoma of the right breast s/p mastectomy and now on Femara and arthritis presented to the emergency room with right arm weakness since morning of the day of admission.  She had progressive weakness that prompted her to visit to the ER.  In the emergency room patient was found with right hemiplegia and acute stroke.  Head CT showed no acute process.  Outside TPA window.  Transferred to [facility] for further management, neurology consult.   Assessment & Plan:   Principal Problem:   Ischemic stroke ([place]) Active Problems:   Depression with anxiety   Insomnia secondary to depression with anxiety   Abnormality of gait   Type 2 diabetes mellitus with neurological complications (HCC)  Acute ischemic stroke: Clinical findings, presented with acute onset right hemiparesis,   CT head findings, initial CT scan was without any acute findings. MRI of the brain, left MCA territory stroke.  CTA of the head and neck shows no emergent large vessel occlusion.  Mild atherosclerotic changes. 2D echocardiogram, ef 35%.  No clinical evidence of congestive heart failure or fluid overload.  Denies any history of coronary artery disease or congestive heart failure.  Euvolemic.  TEE confirmed similar diagnosis.  No embolic source. 30-day event monitoring advised by cardiology instead of implantable loop recorder.  Cardiology will arrange. Antiplatelet therapy, patient on aspirin 325 at home continued.  Dual antiplatelet therapy for 3 weeks and continue Plavix alone as per neurology recommendation. LDL is 152 patient not on any statin at home.  Started on atorvastatin 40 mg. Hemoglobin A1c, 6.3.  Currently on insulin.  On metformin at home.  Her blood sugars are acceptable on her long-term  metformin therapy.  Will resume on discharge. hypertension, blood pressure on goal Therapy is recommended acute inpatient rehab, however patient is not very optimistic about aggressive rehab, she decided to go to a skilled nursing rehab, pending bed availability.  Type 2 diabetes: On metformin at home.  On hold.  Currently remains on insulin.  A1c 6.3   Depression and anxiety: On multiple medications including gabapentin that she will continue.  Chronic congestive heart failure: With no history of coronary artery disease or history of congestive heart failure.  Currently euvolemic.  Outpatient follow-up with cardiology.  DVT prophylaxis: SCDs Code Status: Full code Family Communication: None.  Disposition Plan: Skilled nursing facility.  Transfer when bed is available.   Consultants:   Neurology  Cardiology.  Procedures:   TEE  Antimicrobials:   None   Subjective: No overnight events.  She is trying her best to use her right hand.    Objective: Vitals:   03/23/19 1959 03/24/19 0009 03/24/19 0332 03/24/19 0734  BP: (!) 90/59 (!) 106/57 110/62 136/77  Pulse: 88 89 88 84  Resp: 18 18 16 16   Temp: 98.1 F (36.7 C) 98 F (36.7 C) 98.3 F (36.8 C) 97.9 F (36.6 C)  TempSrc: Oral Oral Oral Oral  SpO2: 98% 99% 98%   Weight:      Height:        Intake/Output Summary (Last 24 hours) at 03/24/2019 1100 Last data filed at 03/23/2019 2124 Gross per 24 hour  Intake 360 ml  Output -  Net 360 ml   Filed Weights   03/20/19 1351 03/20/19 2105 03/20/19 2251  Weight: 109.7 kg 110 kg 77.4 kg    Examination:  General exam: Appears calm and comfortable. Respiratory system: Clear to auscultation. Respiratory effort normal. Cardiovascular system: S1 & S2 heard, RRR. No JVD, murmurs, rubs, gallops or clicks. No pedal edema. Gastrointestinal system: Abdomen is nondistended, soft and nontender. No organomegaly or masses felt. Normal bowel sounds heard. Central nervous system:  Alert and oriented.  Cranial nerves are normal. Extremities: Right upper extremity, proximal 4/5.  Distal 4/5.  Right lower extremity 4+/5.  Left upper and lower extremity normal. Skin: No rashes, lesions or ulcers Psychiatry: Judgement and insight appear normal. Mood & affect appropriate.    Data Reviewed: I have personally reviewed following labs and imaging studies  CBC: Recent Labs  Lab 03/20/19 1348 03/20/19 1407  WBC 10.6*  --   NEUTROABS 8.6*  --   HGB 14.3 14.3  HCT 43.9 42.0  MCV 91.6  --   PLT 226  --    Basic Metabolic Panel: Recent Labs  Lab 03/20/19 1348 03/20/19 1407  NA 136 135  K 4.2 4.2  CL 101 102  CO2 25  --   GLUCOSE 115* 113*  BUN 19 19  CREATININE 0.72 0.80  CALCIUM 8.9  --    GFR: Estimated Creatinine Clearance: 55.5 mL/min (by C-G formula based on SCr of 0.8 mg/dL). Liver Function Tests: Recent Labs  Lab 03/20/19 1348  AST 19  ALT 12  ALKPHOS 66  BILITOT 0.6  PROT 6.9  ALBUMIN 3.9   No results for input(s): LIPASE, AMYLASE in the last 168 hours. No results for input(s): AMMONIA in the last 168 hours. Coagulation Profile: Recent Labs  Lab 03/20/19 1348  INR 1.0   Cardiac Enzymes: No results for input(s): CKTOTAL, CKMB, CKMBINDEX, TROPONINI in the last 168 hours. BNP (last 3 results) No results for input(s): PROBNP in the last 8760 hours. HbA1C: No results for input(s): HGBA1C in the last 72 hours. CBG: Recent Labs  Lab 03/23/19 1218 03/23/19 1646 03/23/19 2106 03/24/19 0602 03/24/19 0816  GLUCAP 84 162* 112* 123* 122*   Lipid Profile: No results for input(s): CHOL, HDL, LDLCALC, TRIG, CHOLHDL, LDLDIRECT in the last 72 hours. Thyroid Function Tests: No results for input(s): TSH, T4TOTAL, FREET4, T3FREE, THYROIDAB in the last 72 hours. Anemia Panel: No results for input(s): VITAMINB12, FOLATE, FERRITIN, TIBC, IRON, RETICCTPCT in the last 72 hours. Sepsis Labs: No results for input(s): PROCALCITON, LATICACIDVEN in the  last 168 hours.  Recent Results (from the past 240 hour(s))  SARS Coronavirus 2 [facility] order, Performed in [facility] hospital lab) Nasopharyngeal Nasopharyngeal Swab     Status: None   Collection Time: 03/20/19  3:17 PM   Specimen: Nasopharyngeal Swab  Result Value Ref Range Status   SARS Coronavirus 2 NEGATIVE NEGATIVE Final    Comment: (NOTE) If result is NEGATIVE SARS-CoV-2 target nucleic acids are NOT DETECTED. The SARS-CoV-2 RNA is generally detectable in upper and lower  respiratory specimens during the acute phase of infection. The lowest  concentration of SARS-CoV-2 viral copies this assay can detect is 250  copies / mL. A negative result does not preclude SARS-CoV-2 infection  and should not be used as the sole basis for treatment or other  patient management decisions.  A negative result may occur with  improper specimen collection / handling, submission of specimen other  than nasopharyngeal swab, presence of viral mutation(s) within the  areas targeted by this assay, and inadequate number of viral copies  (<250 copies /  mL). A negative result must be combined with clinical  observations, patient history, and epidemiological information. If result is POSITIVE SARS-CoV-2 target nucleic acids are DETECTED. The SARS-CoV-2 RNA is generally detectable in upper and lower  respiratory specimens dur ing the acute phase of infection.  Positive  results are indicative of active infection with SARS-CoV-2.  Clinical  correlation with patient history and other diagnostic information is  necessary to determine patient infection status.  Positive results do  not rule out bacterial infection or co-infection with other viruses. If result is PRESUMPTIVE POSTIVE SARS-CoV-2 nucleic acids MAY BE PRESENT.   A presumptive positive result was obtained on the submitted specimen  and confirmed on repeat testing.  While 2019 novel coronavirus  (SARS-CoV-2) nucleic acids may be present in the  submitted sample  additional confirmatory testing may be necessary for epidemiological  and / or clinical management purposes  to differentiate between  SARS-CoV-2 and other Sarbecovirus currently known to infect humans.  If clinically indicated additional testing with an alternate test  methodology [phone]) is advised. The SARS-CoV-2 RNA is generally  detectable in upper and lower respiratory sp ecimens during the acute  phase of infection. The expected result is Negative. Fact Sheet for Patients:  StrictlyIdeas.no Fact Sheet for Healthcare Providers: BankingDealers.co.za This test is not yet approved or cleared by the [place] FDA and has been authorized for detection and/or diagnosis of SARS-CoV-2 by FDA under an Emergency Use Authorization (EUA).  This EUA will remain in effect (meaning this test can be used) for the duration of the COVID-19 declaration under Section 564(b)(1) of the Act, 21 U.S.C. section 360bbb-3(b)(1), unless the authorization is terminated or revoked sooner. Performed at [facility], [address] [place], [place] [address]          Radiology Studies: No results found.      Scheduled Meds: . aspirin  81 mg Oral Q breakfast  . atorvastatin  40 mg Oral q1800  . clopidogrel  75 mg Oral Daily  . enoxaparin (LOVENOX) injection  40 mg Subcutaneous Q24H  . escitalopram  20 mg Oral Daily  . gabapentin  300 mg Oral QHS  . insulin aspart  0-15 Units Subcutaneous TID WC  . insulin aspart  0-5 Units Subcutaneous QHS  . insulin glargine  15 Units Subcutaneous QHS  . letrozole  2.5 mg Oral Daily  . risperiDONE  1 mg Oral QHS  . traZODone  50 mg Oral QHS  . venlafaxine XR  150 mg Oral Q breakfast   Continuous Infusions:   LOS: 4 days    Time spent: 25 minutes    Barb Merino, MD [facility] Pager [phone]  If 7PM-7AM, please contact night-coverage [email or web address] Password [email or web address]  03/24/2019, 11:00 AM

## 2019-03-24 NOTE — TOC Initial Note (Signed)
Transition of Care [facility]) - Initial/Assessment Note    Patient Details  Name: Amy [name] MRN: [id number] Date of Birth: [birth year]  Transition of Care [facility]) CM/SW Contact:    Pollie Friar, RN Phone Number: 03/24/2019, 7:12 AM  Clinical Narrative:                 Recommendations are for CIR. Pt doesn't feel she can tolerate 3 hours of therapy and she will not have 24 hour supervision after a CIR stay. CM met with the patient and her son and she has been to [facility] in the past and would like to return there. FL2 completed and pt faxed out. She is in agreement to potentially going to a facility in [place] area if unable to go to [facility].  TOC following.  Expected Discharge Plan: Skilled Nursing Facility Barriers to Discharge: Continued Medical Work up   Patient Goals and CMS Choice   CMS Medicare.gov Compare Post Acute Care list provided to:: Patient Choice offered to / list presented to : Adult Children, Patient(Son)  Expected Discharge Plan and Services Expected Discharge Plan: Skilled Nursing Facility In-house Referral: Clinical Social Work Discharge Planning Services: CM Consult Post Acute Care Choice: [place]                                        Prior Living Arrangements/Services   Lives with:: Self Patient language and need for interpreter reviewed:: Yes(no needs) Do you feel safe going back to the place where you live?: Yes      Need for Family Participation in Patient Care: Yes (Comment) Care giver support system in place?: No (comment)   Criminal Activity/Legal Involvement Pertinent to Current Situation/Hospitalization: No - Comment as needed  Activities of Daily Living Home Assistive Devices/Equipment: Walker (specify type) ADL Screening (condition at time of admission) Patient's cognitive ability adequate to safely complete daily activities?: Yes Is the patient deaf or have difficulty hearing?: No Does the patient  have difficulty seeing, even when wearing glasses/contacts?: No Does the patient have difficulty concentrating, remembering, or making decisions?: No Patient able to express need for assistance with ADLs?: Yes Does the patient have difficulty dressing or bathing?: Yes Independently performs ADLs?: No Communication: Independent Dressing (OT): Needs assistance Is this a change from baseline?: Pre-admission baseline Grooming: Appropriate for developmental age Feeding: Needs assistance Is this a change from baseline?: Pre-admission baseline Bathing: Needs assistance Is this a change from baseline?: Pre-admission baseline Toileting: Needs assistance Is this a change from baseline?: Pre-admission baseline In/Out Bed: Needs assistance Is this a change from baseline?: Pre-admission baseline Walks in Home: Needs assistance Is this a change from baseline?: Pre-admission baseline Does the patient have difficulty walking or climbing stairs?: Yes Weakness of Legs: None Weakness of Arms/Hands: Right  Permission Sought/Granted                  Emotional Assessment Appearance:: Appears stated age Attitude/Demeanor/Rapport: Engaged Affect (typically observed): Accepting, Pleasant Orientation: : Oriented to Place, Oriented to Self, Oriented to  Time, Oriented to Situation   Psych Involvement: No (comment)  Admission diagnosis:  Ischemic stroke [facility]) [I63.9] Patient Active Problem List   Diagnosis Date Noted  . Ischemic stroke ([place]) 03/20/2019  . Edema of both lower legs due to peripheral venous insufficiency 10/12/2018  . Anemia 05/12/2018  . S/P total knee replacement, right 05/05/18 05/12/2018  .  Type 2 diabetes mellitus with neurological complications ([place]) 97/98/9211  . Primary osteoarthritis of right knee   . Status post surgery 01/26/2018  . Surgery, elective 01/26/2018  . Degenerative spondylolisthesis 01/26/2018  . Osteoporosis 12/13/2016  . Osteopenia 11/26/2016  . Invasive  ductal carcinoma of breast, female, right ([place])   . Lumbago 03/22/2014  . Stiffness of joint, not elsewhere classified, pelvic region and thigh 03/22/2014  . Muscle weakness (generalized) 03/22/2014  . Abnormality of gait 03/22/2014  . Depression with anxiety 09/03/2012  . Insomnia secondary to depression with anxiety 09/03/2012  . Pain 09/03/2012   PCP:  Sinda Du, MD Pharmacy:   [place], [place] [place] [address] Phone: [phone] Fax: [phone]  [facility] [phone] - [place], [place] AT [place] [address] [place] [address] Phone: [phone] Fax: [phone]     Social Determinants of Health (SDOH) Interventions    Readmission Risk Interventions No flowsheet data found.

## 2019-03-25 LAB — GLUCOSE, CAPILLARY
Glucose-Capillary: 121 mg/dL — ABNORMAL HIGH (ref 70–99)
Glucose-Capillary: 149 mg/dL — ABNORMAL HIGH (ref 70–99)
Glucose-Capillary: 68 mg/dL — ABNORMAL LOW (ref 70–99)
Glucose-Capillary: 184 mg/dL — ABNORMAL HIGH (ref 70–99)
Glucose-Capillary: 100 mg/dL — ABNORMAL HIGH (ref 70–99)

## 2019-03-25 LAB — SARS CORONAVIRUS 2 BY RT PCR (HOSPITAL ORDER, PERFORMED IN ~~LOC~~ HOSPITAL LAB): SARS Coronavirus 2: NEGATIVE

## 2019-03-25 NOTE — Plan of Care (Signed)
Patient stable, OOB to chair tolerated well. Discussed POC with patient, agreeable with plan for SNF, denies question/concerns at this time.

## 2019-03-25 NOTE — Progress Notes (Signed)
Physical Therapy Treatment Patient Details Name: Amy [name] MRN: [id number] DOB: Jul 02, 1936 Today's Date: 03/25/2019    History of Present Illness 83 y.o. female with a history of diabetes, anxiety and depression, invasive ductal carcinoma of the right breast on Femara, arthritis.  Patient seen for right arm weakness and unwitnessed fall. CT negative, MRI head - small infarcts at left precentral gyrus and left superior parietal lobe.     PT Comments    Patient seen for mobility progression. Pt requires min/mod A (+2 for safety) for gait training distance of ~32 ft using RW. Pt continues to present with R side weakness (UE>LE), impaired balance, and decreased activity tolerance. Continue to recommend post acute rehab for further skilled PT services.    Follow Up Recommendations  SNF(CIR denied)     Equipment Recommendations  None recommended by PT    Recommendations for Other Services       Precautions / Restrictions Precautions Precautions: Fall Precaution Comments: R sided weakness UE>LE Restrictions Weight Bearing Restrictions: No    Mobility  Bed Mobility Overal bed mobility: Needs Assistance Bed Mobility: Sit to Supine       Sit to supine: Min assist   General bed mobility comments: assist to bring bilat LE into bed; cues for sequencing   Transfers Overall transfer level: Needs assistance Equipment used: Rolling walker (2 wheeled) Transfers: Sit to/from Stand Sit to Stand: Min assist         General transfer comment: assist to power up into standing from RW with hand over hand assist for R hand positioning  Ambulation/Gait Ambulation/Gait assistance: Min assist;+2 safety/equipment;Mod assist Gait Distance (Feet): (~32 ft total) Assistive device: Rolling walker (2 wheeled) Gait Pattern/deviations: Step-through pattern;Decreased stance time - right;Decreased dorsiflexion - right Gait velocity: decreased   General Gait Details: R lateral bias and  assistance required for maintaining R hand grip on RW; assistance needed to guide RW; cues for maintaining safe proximity to [employer] Mobility    Modified Rankin (Stroke Patients Only) Modified Rankin (Stroke Patients Only) Pre-Morbid Rankin Score: No symptoms Modified Rankin: Moderately severe disability     Balance Overall balance assessment: Needs assistance Sitting-balance support: Feet supported;Single extremity supported Sitting balance-Leahy Scale: Fair     Standing balance support: Bilateral upper extremity supported;During functional activity Standing balance-Leahy Scale: Poor                              Cognition Arousal/Alertness: Awake/alert Behavior During Therapy: WFL for tasks assessed/performed Overall Cognitive Status: Impaired/Different from baseline Area of Impairment: Memory;Safety/judgement;Attention;Problem solving                   Current Attention Level: Selective Memory: Decreased short-term memory Following Commands: Follows one step commands consistently     Problem Solving: Difficulty sequencing;Requires verbal cues        Exercises      General Comments        Pertinent Vitals/Pain Pain Assessment: Faces Faces Pain Scale: Hurts a little bit Pain Location: back and buttocks Pain Descriptors / Indicators: Discomfort;Grimacing;Guarding Pain Intervention(s): Limited activity within patient's tolerance;Monitored during session;Repositioned    Home Living                      Prior Function            PT  Goals (current goals can now be found in the care plan section) Progress towards PT goals: Progressing toward goals    Frequency    Min 3X/week      PT Plan Current plan remains appropriate    Co-evaluation              AM-PAC PT "6 Clicks" Mobility   Outcome Measure  Help needed turning from your back to your side while in a flat bed without using  bedrails?: A Little Help needed moving from lying on your back to sitting on the side of a flat bed without using bedrails?: A Little Help needed moving to and from a bed to a chair (including a wheelchair)?: A Little Help needed standing up from a chair using your arms (e.g., wheelchair or bedside chair)?: A Little Help needed to walk in hospital room?: A Little Help needed climbing 3-5 steps with a railing? : A Lot 6 Click Score: 17    End of Session Equipment Utilized During Treatment: Gait belt Activity Tolerance: Patient tolerated treatment well Patient left: with call bell/phone within reach;in bed;with bed alarm set Nurse Communication: Mobility status PT Visit Diagnosis: Difficulty in walking, not elsewhere classified (R26.2);Other symptoms and signs involving the nervous system (R29.898)     Time: [id number] PT Time Calculation (min) (ACUTE ONLY): 15 min  Charges:  $Gait Training: 8-22 mins                     Earney Navy, PTA Acute Rehabilitation Services Pager: [phone] Office: [phone]     Darliss Cheney 03/25/2019, 4:41 PM

## 2019-03-25 NOTE — Care Management Important Message (Signed)
Important Message  Patient Details  Name: Amy [name] MRN: [id number] Date of Birth: 02-25-1936   Medicare Important Message Given:  Yes     Jalan Bodi 03/25/2019, 3:50 PM

## 2019-03-25 NOTE — Progress Notes (Signed)
PROGRESS NOTE    Amy [name]  [id number] DOB: [birth year] DOA: 03/20/2019 PCP: Sinda Du, MD    Brief Narrative:  83 year old female with history of diabetes, anxiety and depression, invasive ductal carcinoma of the right breast s/p mastectomy and now on Femara and arthritis presented to the emergency room with right arm weakness since morning of the day of admission.  She had progressive weakness that prompted her to visit to the ER.  In the emergency room patient was found with right hemiplegia and acute stroke.  Head CT showed no acute process.  Outside TPA window.  Transferred to [facility] for further management, neurology consult.   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 MRI of the brain, left MCA territory stroke.  CTA of the head and neck shows no emergent large vessel occlusion.  Mild atherosclerotic changes. 2D echocardiogram, ef 35%.  No clinical evidence of congestive heart failure or fluid overload.  Denies any history of coronary artery disease or congestive heart failure.  Euvolemic.  TEE confirmed similar diagnosis.  No embolic source. 30-day event monitoring advised by cardiology instead of implantable loop recorder.  Cardiology will arrange. Antiplatelet therapy, patient on aspirin 325 at home continued.  Dual antiplatelet therapy for 3 weeks and continue Plavix alone as per neurology recommendation. LDL is 152 patient not on any statin at home.  Started on atorvastatin 40 mg. Hemoglobin A1c, 6.3.  Currently on insulin.  On metformin at home.  Her blood sugars are acceptable on her long-term  metformin therapy.  Will resume on discharge. hypertension, blood pressure on goal Therapy is recommended acute inpatient rehab, however patient is not very optimistic about aggressive rehab, she decided to go to a skilled nursing rehab, pending bed availability.  Type 2 diabetes: On metformin at home.  On hold.  Currently remains on insulin.  A1c 6.3   Depression and anxiety: On multiple medications including gabapentin that she will continue.  Chronic congestive heart failure: With no history of coronary artery disease or history of congestive heart failure.  Currently euvolemic.  Outpatient follow-up with cardiology.  DVT prophylaxis: SCDs Code Status: Full code Family Communication: None.  Disposition Plan: Skilled nursing facility.  Transfer when bed is available.   Consultants:   Neurology  Cardiology.  Procedures:   TEE  Antimicrobials:   None   Subjective: No overnight events.  She is hoping to get discharged today pending insurance of prior Auth.    Objective: Vitals:   03/24/19 2358 03/25/19 0359 03/25/19 0811 03/25/19 1119  BP: (!) 121/58 133/81 129/65 119/72  Pulse: 78 81 79 86  Resp: 18 18 14 16   Temp: 98.4 F (36.9 C) 98.6 F (37 C) 97.6 F (36.4 C) 97.7 F (36.5 C)  TempSrc: Oral Oral Oral Oral  SpO2: 98% 99% 98% 96%  Weight:      Height:        Intake/Output Summary (Last 24 hours) at 03/25/2019 1335 Last data filed at 03/25/2019 0900 Gross per 24 hour  Intake 360 ml  Output 400 ml  Net -40 ml   Filed Weights   03/20/19 1351 03/20/19 2105 03/20/19  2251  Weight: 109.7 kg 110 kg 77.4 kg    Examination:  General exam: Appears calm and comfortable. Respiratory system: Clear to auscultation. Respiratory effort normal. Cardiovascular system: S1 & S2 heard, RRR. No JVD, murmurs, rubs, gallops or clicks. No pedal edema. Gastrointestinal system: Abdomen is nondistended, soft and nontender. No organomegaly or masses felt. Normal bowel sounds  heard. Central nervous system: Alert and oriented.  Cranial nerves are normal. Extremities: Right upper extremity, proximal 4/5.  Distal 4/5.  Right lower extremity 4+/5.  Left upper and lower extremity normal. Skin: No rashes, lesions or ulcers Psychiatry: Judgement and insight appear normal. Mood & affect appropriate.    Data Reviewed: I have personally reviewed following labs and imaging studies  CBC: Recent Labs  Lab 03/20/19 1348 03/20/19 1407  WBC 10.6*  --   NEUTROABS 8.6*  --   HGB 14.3 14.3  HCT 43.9 42.0  MCV 91.6  --   PLT 226  --    Basic Metabolic Panel: Recent Labs  Lab 03/20/19 1348 03/20/19 1407  NA 136 135  K 4.2 4.2  CL 101 102  CO2 25  --   GLUCOSE 115* 113*  BUN 19 19  CREATININE 0.72 0.80  CALCIUM 8.9  --    GFR: Estimated Creatinine Clearance: 55.5 mL/min (by C-G formula based on SCr of 0.8 mg/dL). Liver Function Tests: Recent Labs  Lab 03/20/19 1348  AST 19  ALT 12  ALKPHOS 66  BILITOT 0.6  PROT 6.9  ALBUMIN 3.9   No results for input(s): LIPASE, AMYLASE in the last 168 hours. No results for input(s): AMMONIA in the last 168 hours. Coagulation Profile: Recent Labs  Lab 03/20/19 1348  INR 1.0   Cardiac Enzymes: No results for input(s): CKTOTAL, CKMB, CKMBINDEX, TROPONINI in the last 168 hours. BNP (last 3 results) No results for input(s): PROBNP in the last 8760 hours. HbA1C: No results for input(s): HGBA1C in the last 72 hours. CBG: Recent Labs  Lab 03/24/19 1142 03/24/19 1716 03/24/19 2220 03/25/19 0603 03/25/19 1117  GLUCAP 247* 84 106* 121* 149*   Lipid Profile: No results for input(s): CHOL, HDL, LDLCALC, TRIG, CHOLHDL, LDLDIRECT in the last 72 hours. Thyroid Function Tests: No results for input(s): TSH, T4TOTAL, FREET4, T3FREE, THYROIDAB in the last 72 hours. Anemia Panel: No results for input(s): VITAMINB12, FOLATE, FERRITIN, TIBC, IRON, RETICCTPCT in the last 72 hours. Sepsis Labs: No results for input(s):  PROCALCITON, LATICACIDVEN in the last 168 hours.  Recent Results (from the past 240 hour(s))  SARS Coronavirus 2 [facility] order, Performed in [facility] hospital lab) Nasopharyngeal Nasopharyngeal Swab     Status: None   Collection Time: 03/20/19  3:17 PM   Specimen: Nasopharyngeal Swab  Result Value Ref Range Status   SARS Coronavirus 2 NEGATIVE NEGATIVE Final    Comment: (NOTE) If result is NEGATIVE SARS-CoV-2 target nucleic acids are NOT DETECTED. The SARS-CoV-2 RNA is generally detectable in upper and lower  respiratory specimens during the acute phase of infection. The lowest  concentration of SARS-CoV-2 viral copies this assay can detect is 250  copies / mL. A negative result does not preclude SARS-CoV-2 infection  and should not be used as the sole basis for treatment or other  patient management decisions.  A negative result may occur with  improper specimen collection / handling, submission of specimen other  than nasopharyngeal swab, presence of viral mutation(s) within the  areas targeted by this assay, and inadequate number of viral copies  (<  250 copies / mL). A negative result must be combined with clinical  observations, patient history, and epidemiological information. If result is POSITIVE SARS-CoV-2 target nucleic acids are DETECTED. The SARS-CoV-2 RNA is generally detectable in upper and lower  respiratory specimens dur ing the acute phase of infection.  Positive  results are indicative of active infection with SARS-CoV-2.  Clinical  correlation with patient history and other diagnostic information is  necessary to determine patient infection status.  Positive results do  not rule out bacterial infection or co-infection with other viruses. If result is PRESUMPTIVE POSTIVE SARS-CoV-2 nucleic acids MAY BE PRESENT.   A presumptive positive result was obtained on the submitted specimen  and confirmed on repeat testing.  While 2019 novel coronavirus  (SARS-CoV-2)  nucleic acids may be present in the submitted sample  additional confirmatory testing may be necessary for epidemiological  and / or clinical management purposes  to differentiate between  SARS-CoV-2 and other Sarbecovirus currently known to infect humans.  If clinically indicated additional testing with an alternate test  methodology [phone]) is advised. The SARS-CoV-2 RNA is generally  detectable in upper and lower respiratory sp ecimens during the acute  phase of infection. The expected result is Negative. Fact Sheet for Patients:  StrictlyIdeas.no Fact Sheet for Healthcare Providers: BankingDealers.co.za This test is not yet approved or cleared by the [place] FDA and has been authorized for detection and/or diagnosis of SARS-CoV-2 by FDA under an Emergency Use Authorization (EUA).  This EUA will remain in effect (meaning this test can be used) for the duration of the COVID-19 declaration under Section 564(b)(1) of the Act, 21 U.S.C. section 360bbb-3(b)(1), unless the authorization is terminated or revoked sooner. Performed at [facility], [address], [place], [place] [address]          Radiology Studies: No results found.      Scheduled Meds: . aspirin  81 mg Oral Q breakfast  . atorvastatin  40 mg Oral q1800  . clopidogrel  75 mg Oral Daily  . enoxaparin (LOVENOX) injection  40 mg Subcutaneous Q24H  . escitalopram  20 mg Oral Daily  . gabapentin  300 mg Oral QHS  . insulin aspart  0-15 Units Subcutaneous TID WC  . insulin aspart  0-5 Units Subcutaneous QHS  . insulin glargine  15 Units Subcutaneous QHS  . letrozole  2.5 mg Oral Daily  . risperiDONE  1 mg Oral QHS  . traZODone  50 mg Oral QHS  . venlafaxine XR  150 mg Oral Q breakfast   Continuous Infusions:   LOS: 5 days    Time spent: 25 minutes    Barb Merino, MD [facility] Pager [phone]  If 7PM-7AM, please contact  night-coverage [email or web address] Password [email or web address] 03/25/2019, 1:35 PM

## 2019-03-26 ENCOUNTER — Other Ambulatory Visit (HOSPITAL_COMMUNITY): Payer: Medicare HMO

## 2019-03-26 ENCOUNTER — Inpatient Hospital Stay
Admission: RE | Admit: 2019-03-26 | Discharge: 2019-04-10 | Disposition: A | Discharge status: Home / Self Care | Payer: Medicare HMO | Source: Ambulatory Visit | Attending: Internal Medicine | Admitting: Internal Medicine

## 2019-03-26 DIAGNOSIS — E1149 Type 2 diabetes mellitus with other diabetic neurological complication: Secondary | ICD-10-CM | POA: Diagnosis not present

## 2019-03-26 DIAGNOSIS — G459 Transient cerebral ischemic attack, unspecified: Secondary | ICD-10-CM | POA: Diagnosis not present

## 2019-03-26 DIAGNOSIS — Z7401 Bed confinement status: Secondary | ICD-10-CM | POA: Diagnosis not present

## 2019-03-26 DIAGNOSIS — M6281 Muscle weakness (generalized): Secondary | ICD-10-CM | POA: Diagnosis not present

## 2019-03-26 DIAGNOSIS — I63512 Cerebral infarction due to unspecified occlusion or stenosis of left middle cerebral artery: Secondary | ICD-10-CM | POA: Diagnosis not present

## 2019-03-26 DIAGNOSIS — C50911 Malignant neoplasm of unspecified site of right female breast: Secondary | ICD-10-CM | POA: Diagnosis not present

## 2019-03-26 DIAGNOSIS — M2550 Pain in unspecified joint: Secondary | ICD-10-CM | POA: Diagnosis not present

## 2019-03-26 DIAGNOSIS — R9430 Abnormal result of cardiovascular function study, unspecified: Secondary | ICD-10-CM | POA: Diagnosis not present

## 2019-03-26 DIAGNOSIS — I639 Cerebral infarction, unspecified: Secondary | ICD-10-CM | POA: Diagnosis not present

## 2019-03-26 DIAGNOSIS — F418 Other specified anxiety disorders: Secondary | ICD-10-CM | POA: Diagnosis not present

## 2019-03-26 DIAGNOSIS — E785 Hyperlipidemia, unspecified: Secondary | ICD-10-CM | POA: Diagnosis not present

## 2019-03-26 DIAGNOSIS — M810 Age-related osteoporosis without current pathological fracture: Secondary | ICD-10-CM | POA: Diagnosis not present

## 2019-03-26 DIAGNOSIS — I499 Cardiac arrhythmia, unspecified: Secondary | ICD-10-CM | POA: Diagnosis not present

## 2019-03-26 DIAGNOSIS — I5021 Acute systolic (congestive) heart failure: Secondary | ICD-10-CM | POA: Diagnosis not present

## 2019-03-26 DIAGNOSIS — I69898 Other sequelae of other cerebrovascular disease: Secondary | ICD-10-CM | POA: Diagnosis not present

## 2019-03-26 DIAGNOSIS — E1169 Type 2 diabetes mellitus with other specified complication: Secondary | ICD-10-CM | POA: Diagnosis not present

## 2019-03-26 DIAGNOSIS — M1990 Unspecified osteoarthritis, unspecified site: Secondary | ICD-10-CM | POA: Diagnosis not present

## 2019-03-26 DIAGNOSIS — M4310 Spondylolisthesis, site unspecified: Secondary | ICD-10-CM | POA: Diagnosis not present

## 2019-03-26 DIAGNOSIS — R69 Illness, unspecified: Secondary | ICD-10-CM | POA: Diagnosis not present

## 2019-03-26 DIAGNOSIS — I959 Hypotension, unspecified: Secondary | ICD-10-CM | POA: Diagnosis not present

## 2019-03-26 LAB — GLUCOSE, CAPILLARY
Glucose-Capillary: 116 mg/dL — ABNORMAL HIGH (ref 70–99)
Glucose-Capillary: 138 mg/dL — ABNORMAL HIGH (ref 70–99)

## 2019-03-26 MED ORDER — CLOPIDOGREL BISULFATE 75 MG PO TABS: 75.0000 mg | ORAL_TABLET | Freq: Every day | ORAL | 2 refills | Status: DC

## 2019-03-26 MED ORDER — ALPRAZOLAM 0.5 MG PO TABS: 0.5000 mg | ORAL_TABLET | Freq: Two times a day (BID) | ORAL | 0 refills | Status: DC | PRN

## 2019-03-26 MED ORDER — ATORVASTATIN CALCIUM 40 MG PO TABS: 40.0000 mg | ORAL_TABLET | Freq: Every day | ORAL | 0 refills | Status: DC

## 2019-03-26 MED ORDER — ASPIRIN 81 MG PO TBEC: 81.0000 mg | DELAYED_RELEASE_TABLET | Freq: Every day | ORAL | 0 refills | Status: AC

## 2019-03-26 NOTE — Plan of Care (Signed)
Patient stable, discussed POC with patient, agreeable with plan for D/C to SNF for rehab, denies question/concerns at this time.

## 2019-03-26 NOTE — Discharge Summary (Signed)
Physician Discharge Summary  Amy [name] [id number] DOB: [birth year] DOA: 03/20/2019  PCP: Sinda Du, MD  Admit date: 03/20/2019 Discharge date: 03/26/2019  Admitted From: home  Disposition:  SNF  Recommendations for Outpatient Follow-up:  1. Follow up with PCP in 1-2 weeks Follow-up with neurology as scheduled.  Home Health: Not applicable Equipment/Devices: Not applicable  Discharge Condition: Stable CODE STATUS: Full code Diet recommendation: Low-salt, low-carb diet  Brief/Interim Summary: 83 year old female with history of diabetes, anxiety and depression, invasive ductal carcinoma of the right breast s/p mastectomy and now on Femara and arthritis presented to the emergency room with right arm weakness since morning of the day of admission.  She had progressive weakness that prompted her to visit to the ER.  In the emergency room patient was found with right hemiplegia and acute stroke.  Head CT showed no acute process.  Outside TPA window.  Transferred to [facility] for further management, neurology consult.   Discharge Diagnoses:  Principal Problem:   Ischemic stroke [facility]) Active Problems:   Depression with anxiety   Insomnia secondary to depression with anxiety   Abnormality of gait   Type 2 diabetes mellitus with neurological complications (HCC)  Acute ischemic stroke: Clinical findings, presented with acute onset right hemiparesis,   CT head findings, initial CT scan was without any acute findings. MRI of the brain, left MCA territory stroke.  CTA of the head and neck shows no emergent large vessel occlusion.  Mild atherosclerotic changes. 2D echocardiogram, ef 35%.  No clinical evidence of congestive heart failure or fluid overload.  Denies any history of coronary artery disease or congestive heart failure.  Euvolemic.  TEE confirmed similar diagnosis.  No embolic source. 30-day event monitoring advised by cardiology instead of implantable loop  recorder.  Cardiology will arrange. Antiplatelet therapy, patient on aspirin 325 at home continued.  Dual antiplatelet therapy for 3 weeks and continue Plavix alone as per neurology recommendation. LDL is 152 patient not on any statin at home.  Started on atorvastatin 40 mg. Hemoglobin A1c, 6.3. On metformin at home.  Her blood sugars are acceptable on her long-term metformin therapy.  resume  Type 2 diabetes: On metformin at home.  On hold.  Currently remains on insulin.  A1c 6.3   Depression and anxiety: On multiple medications including gabapentin that she will continue.  Chronic congestive heart failure: With no history of coronary artery disease or history of congestive heart failure.  Currently euvolemic.  Outpatient follow-up with cardiology   Patient is currently stabilized enough to transfer to skilled level of care. Discharge Instructions  Discharge Instructions    Ambulatory referral to Neurology   Complete by: As directed    Follow up with Dr. Leonie Man at [facility] in 4 weeks. Too complicated for NP to follow. Thanks.   Diet - low sodium heart healthy   Complete by: As directed    Increase activity slowly   Complete by: As directed      Allergies as of 03/26/2019      Reactions   Cymbalta [duloxetine Hcl] Other (See Comments)   Hallucinated and couldn't think; got worse and worse the longer she took this.      Medication List    STOP taking these medications   ibuprofen 800 MG tablet Commonly known as: ADVIL     TAKE these medications   ALPRAZolam 0.5 MG tablet Commonly known as: XANAX Take 1 tablet (0.5 mg total) by mouth 2 (two) times daily as needed  for up to 5 days for anxiety. TAKE 1 TABLET(0.5 MG) BY MOUTH DAILY AS NEEDED FOR ANXIETY What changed:   how much to take  how to take this  when to take this  reasons to take this   aspirin 81 MG EC tablet Take 1 tablet (81 mg total) by mouth daily with breakfast for 21 days. Start taking on: March 27, 2019 What changed:   medication strength  how much to take   atorvastatin 40 MG tablet Commonly known as: LIPITOR Take 1 tablet (40 mg total) by mouth daily at 6 PM.   calcium carbonate 1500 (600 Ca) MG Tabs tablet Commonly known as: OSCAL Take 600 mg of elemental calcium by mouth daily.   cholecalciferol 1000 units tablet Commonly known as: VITAMIN D Take 1 tablet (1,000 Units total) by mouth daily.   clopidogrel 75 MG tablet Commonly known as: PLAVIX Take 1 tablet (75 mg total) by mouth daily. Start taking on: March 27, 2019   escitalopram 20 MG tablet Commonly known as: LEXAPRO Take 1 tablet (20 mg total) by mouth daily.   gabapentin 300 MG capsule Commonly known as: NEURONTIN Take 300 mg by mouth at bedtime.   HM Dry Eye Relief 0.2-0.2-1 % Soln Generic drug: Glycerin-Hypromellose-PEG 400 Apply 1 drop to eye daily as needed (dry eyes).   letrozole 2.5 MG tablet Commonly known as: FEMARA TAKE 1 TABLET(2.5 MG) BY MOUTH DAILY   metFORMIN 500 MG tablet Commonly known as: GLUCOPHAGE Take 500 mg by mouth 2 (two) times daily with a meal.   ONE TOUCH ULTRA TEST test strip Generic drug: glucose blood USE TO TEST BLOOD SUGAR ONCE DAILY   risperiDONE 1 MG tablet Commonly known as: RisperDAL Take 1 tablet (1 mg total) by mouth at bedtime.   traZODone 50 MG tablet Commonly known as: DESYREL TAKE 1 TABLET(50 MG) BY MOUTH AT BEDTIME   venlafaxine XR 150 MG 24 hr capsule Commonly known as: EFFEXOR-XR TAKE 1 CAPSULE(150 MG) BY MOUTH TWICE DAILY What changed:   how much to take  when to take this  additional instructions      Follow-up Information    Garvin Fila, MD. Schedule an appointment as soon as possible for a visit in 4 week(s).   Specialties: Neurology, Radiology Contact information: [address] [place] [address] [phone]          Allergies  Allergen Reactions  . Cymbalta [Duloxetine Hcl] Other (See Comments)     Hallucinated and couldn't think; got worse and worse the longer she took this.     Consultations:  Neurology  Cardiology   Procedures/Studies: Ct Angio Head W Or Wo Contrast  Result Date: 03/21/2019 CLINICAL DATA:  Stroke, follow-up. EXAM: CT ANGIOGRAPHY HEAD AND NECK TECHNIQUE: Multidetector CT imaging of the head and neck was performed using the standard protocol during bolus administration of intravenous contrast. Multiplanar CT image reconstructions and MIPs were obtained to evaluate the vascular anatomy. Carotid stenosis measurements (when applicable) are obtained utilizing NASCET criteria, using the distal internal carotid diameter as the denominator. CONTRAST:  84mL OMNIPAQUE IOHEXOL 350 MG/ML SOLN COMPARISON:  CT head without contrast 03/20/2019. FINDINGS: CTA NECK FINDINGS Aortic arch: Atherosclerotic calcifications are present at the origin of the left subclavian artery without a significant stenosis. Additional calcifications are present in the distal aortic arch and descending thoracic aorta without aneurysm. Great vessel origins are otherwise within normal limits. Right carotid system: The right common carotid artery is within  normal limits. Calcifications are present at the right carotid bifurcation without a significant stenosis. There is moderate tortuosity in the proximal cervical right ICA without a significant stenosis. Left carotid system: The left common carotid artery is within normal limits. Bifurcation is unremarkable. There is moderate tortuosity of the cervical left ICA without a significant stenosis. Vertebral arteries: The vertebral arteries originate from the subclavian arteries bilaterally. The right vertebral artery is dominant. There is no significant stenosis of either vertebral artery in the neck. Skeleton: Levoconvex curvature of the cervical spine is centered at C4. Grade 1 anterolisthesis is present at C3-4. There is chronic loss of disc height and endplate change at  D34-534, C5-6, and C6-7. No focal lytic or blastic lesions are present. Other neck: No focal mucosal or submucosal lesions are present. Nasopharynx is unremarkable. Soft palate and tongue base are normal. Salivary glands are within normal limits. No significant adenopathy is present. Vocal cords are midline and symmetric. Thyroid is normal. Upper chest: Scattered ground-glass attenuation is present without focal consolidation. There is no nodule or mass lesion. No pneumothorax is present. The thoracic inlet is normal. There is mild prominence pericardial fluid. Review of the MIP images confirms the above findings CTA HEAD FINDINGS Anterior circulation: Minimal atherosclerotic changes are present within the cavernous left internal carotid artery. There is no significant stenosis of either carotid artery through the ICA terminus. The A1 and M1 segments are normal. The anterior communicating artery is patent. There is no significant proximal stenosis or occlusion. There is some asymmetric vascularity over the posterior left frontal and parietal lobe without cortical gray matter differentiation loss. Posterior circulation: The right vertebral artery is the dominant vessel. PICA origins are visualized and normal. Vertebrobasilar junction is normal. The basilar artery is unremarkable. Both posterior cerebral arteries originate from the basilar tip. Mild irregularity is present in the posterior cerebral arteries without a significant stenosis. Venous sinuses: The dural sinuses are patent. Straight sinus deep cerebral veins are intact. Right transverse sinus is dominant. Cortical veins are unremarkable. Anatomic variants: None Review of the MIP images confirms the above findings IMPRESSION: 1. No emergent large vessel occlusion. 2. Asymmetric prominence of MCA branch vessels over the left posterior frontal and parietal lobes. This may represent luxury perfusion following TIA or infarct. Gray-white differentiation is preserved  without evidence of a significant cortical infarct. 3. Mild atherosclerotic changes at the aortic arch and right carotid bifurcation without significant stenosis. 4. Multilevel degenerative changes in the cervical spine as described. 5. Scattered ground-glass attenuation lung apices likely represents atelectasis or edema. Electronically Signed   By: San Morelle M.D.   On: 03/21/2019 11:05   Ct Angio Neck W Or Wo Contrast  Result Date: 03/21/2019 CLINICAL DATA:  Stroke, follow-up. EXAM: CT ANGIOGRAPHY HEAD AND NECK TECHNIQUE: Multidetector CT imaging of the head and neck was performed using the standard protocol during bolus administration of intravenous contrast. Multiplanar CT image reconstructions and MIPs were obtained to evaluate the vascular anatomy. Carotid stenosis measurements (when applicable) are obtained utilizing NASCET criteria, using the distal internal carotid diameter as the denominator. CONTRAST:  56mL OMNIPAQUE IOHEXOL 350 MG/ML SOLN COMPARISON:  CT head without contrast 03/20/2019. FINDINGS: CTA NECK FINDINGS Aortic arch: Atherosclerotic calcifications are present at the origin of the left subclavian artery without a significant stenosis. Additional calcifications are present in the distal aortic arch and descending thoracic aorta without aneurysm. Great vessel origins are otherwise within normal limits. Right carotid system: The right common carotid artery is within  normal limits. Calcifications are present at the right carotid bifurcation without a significant stenosis. There is moderate tortuosity in the proximal cervical right ICA without a significant stenosis. Left carotid system: The left common carotid artery is within normal limits. Bifurcation is unremarkable. There is moderate tortuosity of the cervical left ICA without a significant stenosis. Vertebral arteries: The vertebral arteries originate from the subclavian arteries bilaterally. The right vertebral artery is  dominant. There is no significant stenosis of either vertebral artery in the neck. Skeleton: Levoconvex curvature of the cervical spine is centered at C4. Grade 1 anterolisthesis is present at C3-4. There is chronic loss of disc height and endplate change at D34-534, C5-6, and C6-7. No focal lytic or blastic lesions are present. Other neck: No focal mucosal or submucosal lesions are present. Nasopharynx is unremarkable. Soft palate and tongue base are normal. Salivary glands are within normal limits. No significant adenopathy is present. Vocal cords are midline and symmetric. Thyroid is normal. Upper chest: Scattered ground-glass attenuation is present without focal consolidation. There is no nodule or mass lesion. No pneumothorax is present. The thoracic inlet is normal. There is mild prominence pericardial fluid. Review of the MIP images confirms the above findings CTA HEAD FINDINGS Anterior circulation: Minimal atherosclerotic changes are present within the cavernous left internal carotid artery. There is no significant stenosis of either carotid artery through the ICA terminus. The A1 and M1 segments are normal. The anterior communicating artery is patent. There is no significant proximal stenosis or occlusion. There is some asymmetric vascularity over the posterior left frontal and parietal lobe without cortical gray matter differentiation loss. Posterior circulation: The right vertebral artery is the dominant vessel. PICA origins are visualized and normal. Vertebrobasilar junction is normal. The basilar artery is unremarkable. Both posterior cerebral arteries originate from the basilar tip. Mild irregularity is present in the posterior cerebral arteries without a significant stenosis. Venous sinuses: The dural sinuses are patent. Straight sinus deep cerebral veins are intact. Right transverse sinus is dominant. Cortical veins are unremarkable. Anatomic variants: None Review of the MIP images confirms the above  findings IMPRESSION: 1. No emergent large vessel occlusion. 2. Asymmetric prominence of MCA branch vessels over the left posterior frontal and parietal lobes. This may represent luxury perfusion following TIA or infarct. Gray-white differentiation is preserved without evidence of a significant cortical infarct. 3. Mild atherosclerotic changes at the aortic arch and right carotid bifurcation without significant stenosis. 4. Multilevel degenerative changes in the cervical spine as described. 5. Scattered ground-glass attenuation lung apices likely represents atelectasis or edema. Electronically Signed   By: San Morelle M.D.   On: 03/21/2019 11:05   Ct Thoracic Spine Wo Contrast  Result Date: 03/05/2019 CLINICAL DATA:  Fusion surgery July 2019.  Mid to low back pain. EXAM: CT THORACIC SPINE WITHOUT CONTRAST TECHNIQUE: Multidetector CT images of the thoracic were obtained using the standard protocol without intravenous contrast. COMPARISON:  Radiography 01/30/2017.  CT chest 11/14/2016. FINDINGS: Alignment: Normal Vertebrae: No vertebral body abnormality from T1 through T9. Lower cervical region shows degenerative spondylosis. Fusion procedure beginning at T10 extending into the lumbar region. Pedicle screws and posterior rods with posterior fusion bone. Pedicle screws are well positioned. No rod disconnection. No evidence of screw loosening or unintended fracture. Canal and foramina appear widely patent through the region. Paraspinal and other soft tissues: Otherwise negative. Aortic atherosclerosis. Disc levels: No significant degenerative disc disease in the region. No significant facet arthropathy. IMPRESSION: Thoracolumbar fusion beginning at T10. No hardware  malposition, loosening or other complication in the thoracic region. No evidence of any discernible adjacent segment changes at this time. Upper thoracic region is negative Electronically Signed   By: Nelson Chimes M.D.   On: 03/05/2019 13:41   Ct  Lumbar Spine Wo Contrast  Result Date: 03/05/2019 CLINICAL DATA:  Fusion surgery 2019.  Back pain. EXAM: CT LUMBAR SPINE WITHOUT CONTRAST TECHNIQUE: Multidetector CT imaging of the lumbar spine was performed without intravenous contrast administration. Multiplanar CT image reconstructions were also generated. COMPARISON:  Radiography 12/17/2018.  MRI 02/20/2017. FINDINGS: Segmentation: As described previously, S1 is lumbarized. Fusion procedure extends through the S1 level. Alignment: Curvature convex to the right in the thoracolumbar region and to the left in the lumbosacral region. Anterolisthesis at L5-S1 of 11 mm. Anterolisthesis at S1-2 of 2 mm, which would probably worsen with standing or flexion. Vertebrae: Mild subsidence at the L4-5 level. Pronounced subsidence at L5-S1 with fracture of the S1 vertebra as described below. Paraspinal and other soft tissues: Aortic atherosclerosis. Otherwise negative. Disc levels: No significant finding at L1-2 or L2-3. No hardware malposition or complication. L3-4: Mild disc degeneration and bulging of the disc with small left-sided osteophytes. No compressive stenosis. No hardware complication or malposition. L4-5: Previous posterior decompression, diskectomy and interbody spacers. Mild subsidence particularly at the superior endplate of L5. No evidence of screw loosening or malposition at this level. Sufficient patency of the canal and foramina. L5-S1: Fracture of the S1 vertebra pronounced subsidence of the interbody spacers. Loosening of the S1 screws. Anterolisthesis of 11 mm at this level. Severe stenosis of the canal, lateral recesses and neural foramina. S1-2: Advanced bilateral facet arthropathy with 2 mm of anterolisthesis that could worsen with standing or flexion. Bulging of the disc. Stenosis of the lateral recesses and neural foramina that could cause neural compression on either or both sides. Sacroiliac joints show mild osteoarthritis. No evidence of  fracture of the main body of the sacrum beginning at S2. IMPRESSION: No significant finding at L4-5 or above. Fracture of the S1 vertebra with spacer subsidence. Loosening of the pedicle screws of S1. 11 mm of anterolisthesis at L5-S1. Severe stenosis of the canal, lateral recesses and foramina. L5-S1 facet arthropathy with 2 mm of anterolisthesis. Shallow disc protrusion. Stenosis of the lateral recesses and neural foramina that could cause neural compression at this level as well. Electronically Signed   By: Nelson Chimes M.D.   On: 03/05/2019 13:51   Mr Brain Wo Contrast  Result Date: 03/21/2019 CLINICAL DATA:  Focal neuro deficit, > 6 hrs, stroke suspected. Confusion. Right-sided weakness. EXAM: MRI HEAD WITHOUT CONTRAST TECHNIQUE: Multiplanar, multiecho pulse sequences of the brain and surrounding structures were obtained without intravenous contrast. COMPARISON:  CT head without contrast 03/20/2019. CTA of the head and neck 03/21/2019. FINDINGS: Brain: The diffusion-weighted images demonstrate acute nonhemorrhagic infarct involving the left precentral gyrus measuring 1.5 cm cephalo caudad. A punctate area of restricted cortical diffusion is also present more posteriorly in the left parietal lobe. T2 signal changes are associated with the area of acute infarction. T2 signal changes are associated with the area of acute infarction. There are extensive periventricular and subcortical T2 changes bilaterally consistent with diffuse remote ischemia. Dilated perivascular spaces are present within the basal ganglia. Brainstem is normal. A single punctate remote lacunar infarct is present in the right cerebellum. The internal auditory canals are within normal limits. Vascular: Flow is present in the major intracranial arteries. Skull and upper cervical spine: Grade 1 anterolisthesis is  again noted at C2-3 and C3-4. Advanced degenerative changes are noted at C4-5. Craniocervical junction is otherwise normal. Midline  structures are normal. Sinuses/Orbits: The paranasal sinuses and mastoid air cells are clear. The globes and orbits are within normal limits. IMPRESSION: 1. 15 mm area of acute/subacute nonhemorrhagic infarct involving the left precentral gyrus, consistent with the patient's right upper extremity weakness. 2. Additional punctate area of acute/subacute nonhemorrhagic infarct involving the left parietal lobe. 3. Diffuse advanced periventricular and subcortical white matter changes bilaterally reflect the sequela of chronic microvascular ischemia and multiple remote white matter infarcts. 4. Degenerative changes of the cervical spine. Electronically Signed   By: San Morelle M.D.   On: 03/21/2019 12:48   Ct Head Code Stroke Wo Contrast  Result Date: 03/20/2019 CLINICAL DATA:  Code stroke. Focal neuro deficit, > 6 hrs, stroke suspected. Right-sided weakness and facial droop EXAM: CT HEAD WITHOUT CONTRAST TECHNIQUE: Contiguous axial images were obtained from the base of the skull through the vertex without intravenous contrast. COMPARISON:  CT head without contrast 08/01/2011 FINDINGS: Brain: Extensive periventricular and subcortical white matter hypoattenuation has progressed since the prior exam. Basal ganglia are intact the insular cortex is intact. Gray-white differentiation is preserved. No focal cortical infarct is present. Brainstem and cerebellum are normal. The ventricles are of normal size. No significant extraaxial fluid collection is present. Vascular: Atherosclerotic changes are present within the cavernous internal carotid arteries. There is no hyperdense vessel. Skull: Calvarium is intact. No focal lytic or blastic lesions are present. Sinuses/Orbits: The paranasal sinuses and mastoid air cells are clear. The globes and orbits are within normal limits. ASPECTS [facility] Stroke Program Early CT Score) - Ganglionic level infarction (caudate, lentiform nuclei, internal capsule, insula, M1-M3  cortex): 7/7 - Supraganglionic infarction (M4-M6 cortex): 3/3 Total score (0-10 with 10 being normal): 10/10 IMPRESSION: 1. No acute cortical or basal ganglia infarct. 2. Progression of diffuse white matter disease. Acute or subacute white matter infarct is not excluded. 3. Atherosclerosis 4. ASPECTS is 10/10 These results were called by telephone at the time of interpretation on 03/20/2019 at 2:30 pm to Dr. Verneda Skill, who verbally acknowledged these results. Electronically Signed   By: San Morelle M.D.   On: 03/20/2019 14:31      Subjective: Patient seen and examined on the day of discharge.  No overnight events.  She was looking forward to go to rehab and ultimately want to go home.   Discharge Exam: Vitals:   03/26/19 0333 03/26/19 0743  BP: 131/61 (!) 142/66  Pulse: 76 76  Resp: 18 16  Temp: 98.4 F (36.9 C) 97.8 F (36.6 C)  SpO2: 100% 98%   Vitals:   03/25/19 1959 03/25/19 2351 03/26/19 0333 03/26/19 0743  BP: 122/64 112/65 131/61 (!) 142/66  Pulse: 78 75 76 76  Resp: 18 18 18 16   Temp: 98.3 F (36.8 C) 98.3 F (36.8 C) 98.4 F (36.9 C) 97.8 F (36.6 C)  TempSrc: Oral Oral Oral Oral  SpO2: 99% 100% 100% 98%  Weight:      Height:        General: Pt is alert, awake, not in acute distress Cardiovascular: RRR, S1/S2 +, no rubs, no gallops Respiratory: CTA bilaterally, no wheezing, no rhonchi Abdominal: Soft, NT, ND, bowel sounds + Extremities: no edema, no cyanosis Right hemiparesis, right upper extremity 4/5 weaker than right lower extremity but she is using both hands.   The results of significant diagnostics from this hospitalization (including imaging, microbiology, ancillary and  laboratory) are listed below for reference.     Microbiology: Recent Results (from the past 240 hour(s))  SARS Coronavirus 2 [facility] order, Performed in [facility] hospital lab) Nasopharyngeal Nasopharyngeal Swab     Status: None   Collection Time: 03/20/19  3:17 PM    Specimen: Nasopharyngeal Swab  Result Value Ref Range Status   SARS Coronavirus 2 NEGATIVE NEGATIVE Final    Comment: (NOTE) If result is NEGATIVE SARS-CoV-2 target nucleic acids are NOT DETECTED. The SARS-CoV-2 RNA is generally detectable in upper and lower  respiratory specimens during the acute phase of infection. The lowest  concentration of SARS-CoV-2 viral copies this assay can detect is 250  copies / mL. A negative result does not preclude SARS-CoV-2 infection  and should not be used as the sole basis for treatment or other  patient management decisions.  A negative result may occur with  improper specimen collection / handling, submission of specimen other  than nasopharyngeal swab, presence of viral mutation(s) within the  areas targeted by this assay, and inadequate number of viral copies  (<250 copies / mL). A negative result must be combined with clinical  observations, patient history, and epidemiological information. If result is POSITIVE SARS-CoV-2 target nucleic acids are DETECTED. The SARS-CoV-2 RNA is generally detectable in upper and lower  respiratory specimens dur ing the acute phase of infection.  Positive  results are indicative of active infection with SARS-CoV-2.  Clinical  correlation with patient history and other diagnostic information is  necessary to determine patient infection status.  Positive results do  not rule out bacterial infection or co-infection with other viruses. If result is PRESUMPTIVE POSTIVE SARS-CoV-2 nucleic acids MAY BE PRESENT.   A presumptive positive result was obtained on the submitted specimen  and confirmed on repeat testing.  While 2019 novel coronavirus  (SARS-CoV-2) nucleic acids may be present in the submitted sample  additional confirmatory testing may be necessary for epidemiological  and / or clinical management purposes  to differentiate between  SARS-CoV-2 and other Sarbecovirus currently known to infect humans.  If  clinically indicated additional testing with an alternate test  methodology [phone]) is advised. The SARS-CoV-2 RNA is generally  detectable in upper and lower respiratory sp ecimens during the acute  phase of infection. The expected result is Negative. Fact Sheet for Patients:  StrictlyIdeas.no Fact Sheet for Healthcare Providers: BankingDealers.co.za This test is not yet approved or cleared by the [place] FDA and has been authorized for detection and/or diagnosis of SARS-CoV-2 by FDA under an Emergency Use Authorization (EUA).  This EUA will remain in effect (meaning this test can be used) for the duration of the COVID-19 declaration under Section 564(b)(1) of the Act, 21 U.S.C. section 360bbb-3(b)(1), unless the authorization is terminated or revoked sooner. Performed at [facility], [address] [place], [place] [address]   SARS Coronavirus 2 [facility] order, Performed in [facility] hospital lab) Nasopharyngeal Nasopharyngeal Swab     Status: None   Collection Time: 03/25/19  1:34 PM   Specimen: Nasopharyngeal Swab  Result Value Ref Range Status   SARS Coronavirus 2 NEGATIVE NEGATIVE Final    Comment: (NOTE) If result is NEGATIVE SARS-CoV-2 target nucleic acids are NOT DETECTED. The SARS-CoV-2 RNA is generally detectable in upper and lower  respiratory specimens during the acute phase of infection. The lowest  concentration of SARS-CoV-2 viral copies this assay can detect is 250  copies / mL. A negative result does not preclude SARS-CoV-2 infection  and should not be used as the sole basis for treatment or other  patient management decisions.  A negative result may occur with  improper specimen collection / handling, submission of specimen other  than nasopharyngeal swab, presence of viral mutation(s) within the  areas targeted by this assay, and inadequate number of viral copies  (<250 copies / mL). A negative result  must be combined with clinical  observations, patient history, and epidemiological information. If result is POSITIVE SARS-CoV-2 target nucleic acids are DETECTED. The SARS-CoV-2 RNA is generally detectable in upper and lower  respiratory specimens dur ing the acute phase of infection.  Positive  results are indicative of active infection with SARS-CoV-2.  Clinical  correlation with patient history and other diagnostic information is  necessary to determine patient infection status.  Positive results do  not rule out bacterial infection or co-infection with other viruses. If result is PRESUMPTIVE POSTIVE SARS-CoV-2 nucleic acids MAY BE PRESENT.   A presumptive positive result was obtained on the submitted specimen  and confirmed on repeat testing.  While 2019 novel coronavirus  (SARS-CoV-2) nucleic acids may be present in the submitted sample  additional confirmatory testing may be necessary for epidemiological  and / or clinical management purposes  to differentiate between  SARS-CoV-2 and other Sarbecovirus currently known to infect humans.  If clinically indicated additional testing with an alternate test  methodology [phone]) is advised. The SARS-CoV-2 RNA is generally  detectable in upper and lower respiratory sp ecimens during the acute  phase of infection. The expected result is Negative. Fact Sheet for Patients:  StrictlyIdeas.no Fact Sheet for Healthcare Providers: BankingDealers.co.za This test is not yet approved or cleared by the [place] FDA and has been authorized for detection and/or diagnosis of SARS-CoV-2 by FDA under an Emergency Use Authorization (EUA).  This EUA will remain in effect (meaning this test can be used) for the duration of the COVID-19 declaration under Section 564(b)(1) of the Act, 21 U.S.C. section 360bbb-3(b)(1), unless the authorization is terminated or revoked sooner. Performed at [facility], [place] [address] [place], [place] [address]      Labs: BNP (last 3 results) No results for input(s): BNP in the last 8760 hours. Basic Metabolic Panel: Recent Labs  Lab 03/20/19 1348 03/20/19 1407  NA 136 135  K 4.2 4.2  CL 101 102  CO2 25  --   GLUCOSE 115* 113*  BUN 19 19  CREATININE 0.72 0.80  CALCIUM 8.9  --    Liver Function Tests: Recent Labs  Lab 03/20/19 1348  AST 19  ALT 12  ALKPHOS 66  BILITOT 0.6  PROT 6.9  ALBUMIN 3.9   No results for input(s): LIPASE, AMYLASE in the last 168 hours. No results for input(s): AMMONIA in the last 168 hours. CBC: Recent Labs  Lab 03/20/19 1348 03/20/19 1407  WBC 10.6*  --   NEUTROABS 8.6*  --   HGB 14.3 14.3  HCT 43.9 42.0  MCV 91.6  --   PLT 226  --    Cardiac Enzymes: No results for input(s): CKTOTAL, CKMB, CKMBINDEX, TROPONINI in the last 168 hours. BNP: Invalid input(s): POCBNP CBG: Recent Labs  Lab 03/25/19 1117 03/25/19 1609 03/25/19 1701 03/25/19 2107 03/26/19 0604  GLUCAP 149* 68* 184* 100* 116*   D-Dimer No results for input(s): DDIMER in the last 72 hours. Hgb A1c No results for input(s): HGBA1C in the last 72 hours. Lipid Profile No results for input(s): CHOL, HDL, LDLCALC,  TRIG, CHOLHDL, LDLDIRECT in the last 72 hours. Thyroid function studies No results for input(s): TSH, T4TOTAL, T3FREE, THYROIDAB in the last 72 hours.  Invalid input(s): FREET3 Anemia work up No results for input(s): VITAMINB12, FOLATE, FERRITIN, TIBC, IRON, RETICCTPCT in the last 72 hours. Urinalysis    Component Value Date/Time   COLORURINE YELLOW 03/20/2019 1348   APPEARANCEUR HAZY (A) 03/20/2019 1348   LABSPEC 1.011 03/20/2019 1348   PHURINE 7.0 03/20/2019 1348   GLUCOSEU NEGATIVE 03/20/2019 1348   HGBUR NEGATIVE 03/20/2019 1348   BILIRUBINUR NEGATIVE 03/20/2019 1348   KETONESUR NEGATIVE 03/20/2019 1348   PROTEINUR NEGATIVE 03/20/2019 1348   NITRITE POSITIVE (A) 03/20/2019 1348   LEUKOCYTESUR TRACE  (A) 03/20/2019 1348   Sepsis Labs Invalid input(s): PROCALCITONIN,  WBC,  LACTICIDVEN Microbiology Recent Results (from the past 240 hour(s))  SARS Coronavirus 2 [facility] order, Performed in [facility] hospital lab) Nasopharyngeal Nasopharyngeal Swab     Status: None   Collection Time: 03/20/19  3:17 PM   Specimen: Nasopharyngeal Swab  Result Value Ref Range Status   SARS Coronavirus 2 NEGATIVE NEGATIVE Final    Comment: (NOTE) If result is NEGATIVE SARS-CoV-2 target nucleic acids are NOT DETECTED. The SARS-CoV-2 RNA is generally detectable in upper and lower  respiratory specimens during the acute phase of infection. The lowest  concentration of SARS-CoV-2 viral copies this assay can detect is 250  copies / mL. A negative result does not preclude SARS-CoV-2 infection  and should not be used as the sole basis for treatment or other  patient management decisions.  A negative result may occur with  improper specimen collection / handling, submission of specimen other  than nasopharyngeal swab, presence of viral mutation(s) within the  areas targeted by this assay, and inadequate number of viral copies  (<250 copies / mL). A negative result must be combined with clinical  observations, patient history, and epidemiological information. If result is POSITIVE SARS-CoV-2 target nucleic acids are DETECTED. The SARS-CoV-2 RNA is generally detectable in upper and lower  respiratory specimens dur ing the acute phase of infection.  Positive  results are indicative of active infection with SARS-CoV-2.  Clinical  correlation with patient history and other diagnostic information is  necessary to determine patient infection status.  Positive results do  not rule out bacterial infection or co-infection with other viruses. If result is PRESUMPTIVE POSTIVE SARS-CoV-2 nucleic acids MAY BE PRESENT.   A presumptive positive result was obtained on the submitted specimen  and confirmed on repeat  testing.  While 2019 novel coronavirus  (SARS-CoV-2) nucleic acids may be present in the submitted sample  additional confirmatory testing may be necessary for epidemiological  and / or clinical management purposes  to differentiate between  SARS-CoV-2 and other Sarbecovirus currently known to infect humans.  If clinically indicated additional testing with an alternate test  methodology [phone]) is advised. The SARS-CoV-2 RNA is generally  detectable in upper and lower respiratory sp ecimens during the acute  phase of infection. The expected result is Negative. Fact Sheet for Patients:  StrictlyIdeas.no Fact Sheet for Healthcare Providers: BankingDealers.co.za This test is not yet approved or cleared by the [place] FDA and has been authorized for detection and/or diagnosis of SARS-CoV-2 by FDA under an Emergency Use Authorization (EUA).  This EUA will remain in effect (meaning this test can be used) for the duration of the COVID-19 declaration under Section 564(b)(1) of the Act, 21 U.S.C. section 360bbb-3(b)(1), unless the authorization is terminated or revoked  sooner. Performed at [facility], [address], [place], [place] [address]   SARS Coronavirus 2 [facility] order, Performed in [facility] hospital lab) Nasopharyngeal Nasopharyngeal Swab     Status: None   Collection Time: 03/25/19  1:34 PM   Specimen: Nasopharyngeal Swab  Result Value Ref Range Status   SARS Coronavirus 2 NEGATIVE NEGATIVE Final    Comment: (NOTE) If result is NEGATIVE SARS-CoV-2 target nucleic acids are NOT DETECTED. The SARS-CoV-2 RNA is generally detectable in upper and lower  respiratory specimens during the acute phase of infection. The lowest  concentration of SARS-CoV-2 viral copies this assay can detect is 250  copies / mL. A negative result does not preclude SARS-CoV-2 infection  and should not be used as the sole basis for treatment or other   patient management decisions.  A negative result may occur with  improper specimen collection / handling, submission of specimen other  than nasopharyngeal swab, presence of viral mutation(s) within the  areas targeted by this assay, and inadequate number of viral copies  (<250 copies / mL). A negative result must be combined with clinical  observations, patient history, and epidemiological information. If result is POSITIVE SARS-CoV-2 target nucleic acids are DETECTED. The SARS-CoV-2 RNA is generally detectable in upper and lower  respiratory specimens dur ing the acute phase of infection.  Positive  results are indicative of active infection with SARS-CoV-2.  Clinical  correlation with patient history and other diagnostic information is  necessary to determine patient infection status.  Positive results do  not rule out bacterial infection or co-infection with other viruses. If result is PRESUMPTIVE POSTIVE SARS-CoV-2 nucleic acids MAY BE PRESENT.   A presumptive positive result was obtained on the submitted specimen  and confirmed on repeat testing.  While 2019 novel coronavirus  (SARS-CoV-2) nucleic acids may be present in the submitted sample  additional confirmatory testing may be necessary for epidemiological  and / or clinical management purposes  to differentiate between  SARS-CoV-2 and other Sarbecovirus currently known to infect humans.  If clinically indicated additional testing with an alternate test  methodology [phone]) is advised. The SARS-CoV-2 RNA is generally  detectable in upper and lower respiratory sp ecimens during the acute  phase of infection. The expected result is Negative. Fact Sheet for Patients:  StrictlyIdeas.no Fact Sheet for Healthcare Providers: BankingDealers.co.za This test is not yet approved or cleared by the [place] FDA and has been authorized for detection and/or diagnosis of SARS-CoV-2  by FDA under an Emergency Use Authorization (EUA).  This EUA will remain in effect (meaning this test can be used) for the duration of the COVID-19 declaration under Section 564(b)(1) of the Act, 21 U.S.C. section 360bbb-3(b)(1), unless the authorization is terminated or revoked sooner. Performed at [facility], [place] [address], [place], [place] [address]      Time coordinating discharge:  35 minutes  SIGNED:   Barb Merino, MD  [facility] 03/26/2019, 10:16 AM

## 2019-03-26 NOTE — TOC Transition Note (Signed)
Transition of Care [facility]) - CM/SW Discharge Note   Patient Details  Name: Amy [name] MRN: [id number] Date of Birth: 08/05/1935  [facility] CM/SW Contact:  Geralynn Ochs, LCSW Phone Number: 03/26/2019, 12:38 PM   Clinical Narrative:   Nurse to call report to [phone], Room 154    Final next level of care: Skilled Nursing Facility Barriers to Discharge: Barriers Resolved   Patient Goals and CMS Choice   CMS Medicare.gov Compare Post Acute Care list provided to:: Patient Choice offered to / list presented to : Adult Children, Patient(Son)  Discharge Placement                Patient to be transferred to facility by: [place] Name of family member notified: Jon Patient and family notified of of transfer: 03/26/19  Discharge Plan and Services In-house Referral: Clinical Social Work Discharge Planning Services: CM Consult Post Acute Care Choice: [place]                               Social Determinants of Health (SDOH) Interventions     Readmission Risk Interventions No flowsheet data found.

## 2019-03-26 NOTE — Plan of Care (Signed)
Progressing on all goals at this time.  

## 2019-03-29 ENCOUNTER — Encounter: Payer: Self-pay | Admitting: Adult Health

## 2019-03-29 ENCOUNTER — Non-Acute Institutional Stay (SKILLED_NURSING_FACILITY): Payer: Medicare HMO | Admitting: Adult Health

## 2019-03-29 DIAGNOSIS — I639 Cerebral infarction, unspecified: Secondary | ICD-10-CM | POA: Diagnosis not present

## 2019-03-29 DIAGNOSIS — M4310 Spondylolisthesis, site unspecified: Secondary | ICD-10-CM

## 2019-03-29 DIAGNOSIS — R9430 Abnormal result of cardiovascular function study, unspecified: Secondary | ICD-10-CM | POA: Diagnosis not present

## 2019-03-29 DIAGNOSIS — R931 Abnormal findings on diagnostic imaging of heart and coronary circulation: Secondary | ICD-10-CM

## 2019-03-29 DIAGNOSIS — E1149 Type 2 diabetes mellitus with other diabetic neurological complication: Secondary | ICD-10-CM | POA: Diagnosis not present

## 2019-03-29 DIAGNOSIS — E1169 Type 2 diabetes mellitus with other specified complication: Secondary | ICD-10-CM | POA: Diagnosis not present

## 2019-03-29 DIAGNOSIS — F323 Major depressive disorder, single episode, severe with psychotic features: Secondary | ICD-10-CM

## 2019-03-29 DIAGNOSIS — C50911 Malignant neoplasm of unspecified site of right female breast: Secondary | ICD-10-CM | POA: Diagnosis not present

## 2019-03-29 DIAGNOSIS — R69 Illness, unspecified: Secondary | ICD-10-CM | POA: Diagnosis not present

## 2019-03-29 DIAGNOSIS — E785 Hyperlipidemia, unspecified: Secondary | ICD-10-CM | POA: Diagnosis not present

## 2019-03-29 NOTE — Progress Notes (Signed)
Location:    [facility] Room Number: 154/D Place of Service:  SNF (31)   CODE STATUS: Full Code  Allergies  Allergen Reactions  . Cymbalta [Duloxetine Hcl] Other (See Comments)    Hallucinated and couldn't think; got worse and worse the longer she took this.    Chief Complaint  Patient presents with  . Hospitalization Follow-up    HPI:  She is a 83 year old woman who has been hospitalized from 03-20-19 through 03-26-19. She was hospitalized after she suffered a stroke with right upper extremity weakness at home with a fall. She had a TEE which demonstrates an EF of 3-35%. She is here for short term rehab. She denies any uncontrolled pain; no difficulty with speech. No chest pain or shortness of breath. She does have chronic depression and anxiety which is presently being managed. Her goal is to return back home. She has 2 steps into her house. She has family support and has a helper for 4 hours 5 days weekly. She does have a walker at home. She will continue to be followed for her chronic illnesses including: diabetes; dyslipidemia; depression   Past Medical History:  Diagnosis Date  . Anxiety   . Arthritis   . Back pain   . Colon polyps   . Depression   . Diabetes mellitus, type II ([place])    diet controlled  . Hypercholesterolemia   . Invasive ductal carcinoma of breast, female, right ([place])   . Osteopenia 11/26/2016  . Skin-picking disorder    has 3 open wounds on right wrist that are being treated. About a quarter in size.    Past Surgical History:  Procedure Laterality Date  . APPENDECTOMY    . BREAST BIOPSY Right 10/09/2016   Procedure: BREAST BIOPSY WITH NEEDLE LOCALIZATION;  Surgeon: [name], MD;  Location: [facility] ORS;  Service: General;  Laterality: Right;  . BREAST SURGERY Right   . BUBBLE STUDY  03/23/2019   Procedure: BUBBLE STUDY;  Surgeon: [name], MD;  Location: [facility] ENDOSCOPY;  Service: Cardiovascular;;  . BUNIONECTOMY Bilateral    . FACIAL COSMETIC SURGERY     drooping eyelid and brow  . HERNIA REPAIR Right    Inguinal x2  . KNEE ARTHROSCOPY Right   . SHOULDER SURGERY Right    rotator cuff repair and removal of bone spur  . TEE WITHOUT CARDIOVERSION N/A 03/23/2019   Procedure: TRANSESOPHAGEAL ECHOCARDIOGRAM (TEE);  Surgeon: [name], MD;  Location: [facility] ENDOSCOPY;  Service: Cardiovascular;  Laterality: N/A;  . TEMPOROMANDIBULAR JOINT SURGERY Right   . TOTAL KNEE ARTHROPLASTY Right 05/05/2018   Procedure: TOTAL KNEE ARTHROPLASTY;  Surgeon: [name], MD;  Location: [facility] ORS;  Service: Orthopedics;  Laterality: Right;    Social History   Socioeconomic History  . Marital status: Divorced    Spouse name: Not on file  . Number of children: Not on file  . Years of education: Not on file  . Highest education level: Not on file  Occupational History  . 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 Financial resource strain: Not hard at all  . Food insecurity    Worry: Never true    Inability: Never true  . Transportation needs    Medical: No    Non-medical: No  Tobacco Use  . Smoking status: Former Smoker    Packs/day: 0.50    Years: 40.00    Pack years: 20.00  Types: Cigarettes    Quit date: 09/04/1995    Years since quitting: 23.5  . Smokeless tobacco: Never Used  Substance and Sexual Activity  . Alcohol use: No  . Drug use: No    Types: Benzodiazepines, Hydrocodone  . Sexual activity: Never  Lifestyle  . Physical activity    Days per week: 0 days    Minutes per session: 0 min  . Stress: Only a little  Relationships  . Social Herbalist on phone: Once a week    Gets together: Never    Attends religious service: More than 4 times per year    Active member of club or organization: No    Attends meetings of clubs or organizations: Never    Relationship status: Divorced  . Intimate partner violence    Fear of current or ex partner: No    Emotionally  abused: No    Physically abused: No    Forced sexual activity: No  Other Topics Concern  . Not on file  Social History Narrative  . Not on file   Family History  Problem Relation Age of Onset  . Anxiety disorder Mother   . Depression Mother   . Depression Maternal Grandmother   . Alcohol abuse Brother   . Cancer - Other Sister   . ADD / ADHD Neg Hx   . Bipolar disorder Neg Hx   . Dementia Neg Hx   . Drug abuse Neg Hx   . OCD Neg Hx   . Paranoid behavior Neg Hx   . Schizophrenia Neg Hx   . Seizures Neg Hx   . Sexual abuse Neg Hx   . Physical abuse Neg Hx       VITAL SIGNS BP 136/75   Pulse 82   Temp 98.9 F (37.2 C) (Oral)   Resp 20   Ht 5\' 4"  (1.626 m)   Wt 168 lb (76.2 kg)   BMI 28.84 kg/m   Outpatient Encounter Medications as of 03/29/2019  Medication Sig  . ALPRAZolam (XANAX) 0.5 MG tablet Take 1 tablet (0.5 mg total) by mouth 2 (two) times daily as needed for up to 5 days for anxiety. TAKE 1 TABLET(0.5 MG) BY MOUTH DAILY AS NEEDED FOR ANXIETY  . aspirin EC 81 MG EC tablet Take 1 tablet (81 mg total) by mouth daily with breakfast for 21 days.  Marland Kitchen atorvastatin (LIPITOR) 40 MG tablet Take 1 tablet (40 mg total) by mouth daily at 6 PM.  . calcium carbonate (OSCAL) 1500 (600 Ca) MG TABS tablet Take 600 mg of elemental calcium by mouth daily.  . cholecalciferol (VITAMIN D) 1000 units tablet Take 1 tablet (1,000 Units total) by mouth daily.  . clopidogrel (PLAVIX) 75 MG tablet Take 1 tablet (75 mg total) by mouth daily.  Marland Kitchen escitalopram (LEXAPRO) 20 MG tablet Take 1 tablet (20 mg total) by mouth daily.  Marland Kitchen gabapentin (NEURONTIN) 300 MG capsule Take 300 mg by mouth at bedtime.   . Glycerin-Hypromellose-PEG 400 (HM DRY EYE RELIEF) 0.2-0.2-1 % SOLN Apply 1 drop to eye daily as needed (dry eyes).  Marland Kitchen letrozole (FEMARA) 2.5 MG tablet TAKE 1 TABLET(2.5 MG) BY MOUTH DAILY  . metFORMIN (GLUCOPHAGE) 500 MG tablet Take 500 mg by mouth 2 (two) times daily with a meal.   . NON  FORMULARY Diet: Regular, NAS, Consistent Carbohydrate  . ONE TOUCH ULTRA TEST test strip USE TO TEST BLOOD SUGAR ONCE DAILY  . risperiDONE (RISPERDAL) 1 MG tablet  Take 1 tablet (1 mg total) by mouth at bedtime.  . traZODone (DESYREL) 50 MG tablet TAKE 1 TABLET(50 MG) BY MOUTH AT BEDTIME  . venlafaxine XR (EFFEXOR-XR) 150 MG 24 hr capsule TAKE 1 CAPSULE(150 MG) BY MOUTH TWICE DAILY   No facility-administered encounter medications on file as of 03/29/2019.      SIGNIFICANT DIAGNOSTIC EXAMS  TODAY;   03-20-19: ct of head:  1. No acute cortical or basal ganglia infarct. 2. Progression of diffuse white matter disease. Acute or subacute white matter infarct is not excluded. 3. Atherosclerosis 4. ASPECTS is 10/10  03-21-19: ct angio of head and neck:  1. No emergent large vessel occlusion. 2. Asymmetric prominence of MCA branch vessels over the left posterior frontal and parietal lobes. This may represent luxury perfusion following TIA or infarct. Gray-white differentiation is preserved without evidence of a significant cortical infarct. 3. Mild atherosclerotic changes at the aortic arch and right carotid bifurcation without significant stenosis. 4. Multilevel degenerative changes in the cervical spine as described. 5. Scattered ground-glass attenuation lung apices likely represents atelectasis or edema.  03-21-19: mri of brain:  1. 15 mm area of acute/subacute nonhemorrhagic infarct involving the left precentral gyrus, consistent with the patient's right upper extremity weakness. 2. Additional punctate area of acute/subacute nonhemorrhagic infarct involving the left parietal lobe. 3. Diffuse advanced periventricular and subcortical white matter changes bilaterally reflect the sequela of chronic microvascular ischemia and multiple remote white matter infarcts. 4. Degenerative changes of the cervical spine.  03-23-19: TEE echo:   1. The left ventricle has moderate-severely reduced systolic  function, with an ejection fraction of 30-35%. The cavity size was normal. Left ventrical global hypokinesis without regional wall motion abnormalities.  2. There is no increase in right ventricular wall thickness.  3. Left atrial size was mildly dilated.  4. Small pericardial effusion.  5. The pericardial effusion is anterior to the right ventricle.  6. The MR jet is posteriorly-directed.  7. Mildly restricted posterior MV leaflet.  8. Aortic valve regurgitation is trivial by color flow Doppler.  9. The aortic root and ascending aorta are normal in size and structure. 10. There is evidence of severe plaque in the descending aorta. 11. Small patent foramen ovale with predominantly right to left shunting across the atrial septum. 12. Evidence of atrial level shunting detected by color flow Doppler. 13. The interatrial septum appears to be lipomatous.  LABS REVIEWED TODAY;   03-20-19: wbc 10.6; hgb 14.3; hct 43.8; mcv 91.6; plt 226; glucose 115; bun 19; creat 0.72; k+ 4.2; na++ 136; ca 8.9; liver normal albumin 3.9 03-21-19: hgb a1c 6.3; chol 264; ldl 153; trig 174; hdl 76   Review of Systems  Constitutional: Negative for malaise/fatigue.  Respiratory: Negative for cough and shortness of breath.   Cardiovascular: Negative for chest pain, palpitations and leg swelling.  Gastrointestinal: Negative for abdominal pain, constipation and heartburn.  Musculoskeletal: Negative for back pain, joint pain and myalgias.  Skin: Negative.   Neurological: Negative for dizziness.  Psychiatric/Behavioral: The patient is not nervous/anxious.     Physical Exam Constitutional:      General: She is not in acute distress.    Appearance: She is well-developed. She is not diaphoretic.  Neck:     Musculoskeletal: Neck supple.     Thyroid: No thyromegaly.  Cardiovascular:     Rate and Rhythm: Normal rate and regular rhythm.     Pulses: Normal pulses.     Heart sounds: Normal heart sounds.  Pulmonary:  Effort: Pulmonary effort is normal. No respiratory distress.     Breath sounds: Normal breath sounds.  Chest:     Comments: History of right mastectomy  Abdominal:     General: Bowel sounds are normal. There is no distension.     Palpations: Abdomen is soft.     Tenderness: There is no abdominal tenderness.  Musculoskeletal:     Right lower leg: No edema.     Left lower leg: No edema.     Comments: Is able to move all extremities  has mild right upper extremity weakness   Lymphadenopathy:     Cervical: No cervical adenopathy.  Skin:    General: Skin is warm and dry.  Neurological:     Mental Status: She is alert and oriented to person, place, and time.  Psychiatric:        Mood and Affect: Mood normal.      ASSESSMENT/ PLAN:  TODAY;   1. Ischemic stroke: is neurologically stable: will continue plavix 75 mg daily and asa 81 mg daily will continue therapy as directed and will follow up with neurology will monitor her status.   2. Cardiac LV ejection fraction 30-35%: her EF is 30-30% (03-23-19): will continue to monitor her status.   3. Type 2 diabetes mellitus with neurological complications: is stable hgb 6.1 will reduce her metformin to 500 mg daily due to her advanced age and low hgb a1c   4. Dyslipidemia associated with type 2 diabetes mellitus: is stable LDL 153 trig 174; will continue lipitor 40 mg daily   5. Degenerative spondylolisthesis: is stable will continue to monitor her status.   6. Invasive ductal carcinoma of breast female right: is status post right mastectomy (2018); will continue femera 2.5 mg daily   7. Major depression with psychotic features: is without change in her status: will continue lexapro 20 mg daily neurontin 300 mg nightly trazodone 50 mg nightly risperdal 1 mg nightly and xanax 0.5 mg twice daily as needed through 04-12-19.     MD is aware of resident's narcotic use and is in agreement with current plan of care. We will attempt to wean  resident as appropriate.  Ok Edwards NP [facility]  Contact [phone] Monday through Friday 8am- 5pm  After hours call [phone]

## 2019-03-30 ENCOUNTER — Inpatient Hospital Stay: Admit: 2019-03-30 | Payer: Medicare HMO | Admitting: Neurosurgery

## 2019-03-30 SURGERY — LAMINECTOMY WITH POSTERIOR LATERAL ARTHRODESIS LEVEL 3
Anesthesia: General

## 2019-03-31 ENCOUNTER — Encounter: Payer: Self-pay | Admitting: Internal Medicine

## 2019-03-31 ENCOUNTER — Other Ambulatory Visit (HOSPITAL_COMMUNITY)
Admission: AD | Admit: 2019-03-31 | Discharge: 2019-03-31 | Disposition: A | Discharge status: Home / Self Care | Payer: Medicare HMO | Source: Ambulatory Visit | Attending: Internal Medicine | Admitting: Internal Medicine

## 2019-03-31 ENCOUNTER — Non-Acute Institutional Stay (SKILLED_NURSING_FACILITY): Payer: Medicare HMO | Admitting: Internal Medicine

## 2019-03-31 DIAGNOSIS — F418 Other specified anxiety disorders: Secondary | ICD-10-CM | POA: Diagnosis not present

## 2019-03-31 DIAGNOSIS — I639 Cerebral infarction, unspecified: Secondary | ICD-10-CM | POA: Insufficient documentation

## 2019-03-31 DIAGNOSIS — E1149 Type 2 diabetes mellitus with other diabetic neurological complication: Secondary | ICD-10-CM

## 2019-03-31 DIAGNOSIS — E1169 Type 2 diabetes mellitus with other specified complication: Secondary | ICD-10-CM | POA: Diagnosis not present

## 2019-03-31 DIAGNOSIS — C50911 Malignant neoplasm of unspecified site of right female breast: Secondary | ICD-10-CM

## 2019-03-31 DIAGNOSIS — E785 Hyperlipidemia, unspecified: Secondary | ICD-10-CM | POA: Diagnosis not present

## 2019-03-31 DIAGNOSIS — Z20828 Contact with and (suspected) exposure to other viral communicable diseases: Secondary | ICD-10-CM | POA: Insufficient documentation

## 2019-03-31 DIAGNOSIS — F323 Major depressive disorder, single episode, severe with psychotic features: Secondary | ICD-10-CM

## 2019-03-31 DIAGNOSIS — R69 Illness, unspecified: Secondary | ICD-10-CM | POA: Diagnosis not present

## 2019-03-31 LAB — SARS CORONAVIRUS 2 (TAT 6-24 HRS): SARS Coronavirus 2: NEGATIVE

## 2019-03-31 NOTE — Progress Notes (Signed)
: Provider:  Hennie Duos., MD Location:  [facility] Room Number: 154-D Place of Service:  SNF (31)  PCP: Sinda Du, MD Patient Care Team: Sinda Du, MD as PCP - General (Internal Medicine)  Extended Emergency Contact Information Primary Emergency Contact: Jones,Jonathan Address: [address]          [place], [place] [address] Johnnette Litter of [place] Phone: [phone] Mobile Phone: [phone] Relation: Son Secondary Emergency Contact: Holt,Carlene Address: [place],  [address] Johnnette Litter of [place] Phone: [phone] Mobile Phone: [phone] Relation: Sister     Allergies: Cymbalta [duloxetine hcl]  Chief Complaint  Patient presents with  . New Admit To SNF    New admission to [facility] SNF    HPI: Patient is an 83 y.o. female diabetes, anxiety, depression, invasive ductal carcinoma of the right breast status post mastectomy now on femora, and arthritis who presented to any [facility] with right arm weakness since the morning of the day of admission.  She had had progressive weakness that prompted her visit to the ED.  In the ED patient was found to have right hemiplegia and acute stroke.  Head CT showed no acute process.  Patient was outside the TPA window.  Patient was transferred to [facility] for further management and neurology consult.  MRI of the head showed left MCA territory stroke study showed no embolic source.  Antiplatelet therapy was started on aspirin 325 with dual antiplatelet therapy for 3 weeks to continue with Plavix alone after that.  LDL was elevated patient was started on Lipitor.  Hemoglobin A1c is 6.3 which was good.  All other chronic problems being stable patient is admitted to [facility] for OT/PT.  While at skilled nursing facility patient will be followed for type 2 diabetes treated with metformin, breast cancer treated with Femara and depression  treated with Lexapro.  Past Medical History:  Diagnosis Date  . Anxiety   . Arthritis   . Back pain   . Colon polyps   . Depression   . Diabetes mellitus, type II ([place])    diet controlled  . Hypercholesterolemia   . Invasive ductal carcinoma of breast, female, right ([employer])   . Osteopenia 11/26/2016  . Skin-picking disorder    has 3 open wounds on right wrist that are being treated. About a quarter in size.    Past Surgical History:  Procedure Laterality Date  . APPENDECTOMY    . BREAST BIOPSY Right 10/09/2016   Procedure: BREAST BIOPSY WITH NEEDLE LOCALIZATION;  Surgeon: [name], MD;  Location: [facility] ORS;  Service: General;  Laterality: Right;  . BREAST SURGERY Right   . BUBBLE STUDY  03/23/2019   Procedure: BUBBLE STUDY;  Surgeon: [name], MD;  Location: [facility] ENDOSCOPY;  Service: Cardiovascular;;  . BUNIONECTOMY Bilateral   . FACIAL COSMETIC SURGERY     drooping eyelid and brow  . HERNIA REPAIR Right    Inguinal x2  . KNEE ARTHROSCOPY Right   . SHOULDER SURGERY Right    rotator cuff repair and removal of bone spur  . TEE WITHOUT CARDIOVERSION N/A 03/23/2019   Procedure: TRANSESOPHAGEAL ECHOCARDIOGRAM (TEE);  Surgeon: [name], MD;  Location: [facility] ENDOSCOPY;  Service: Cardiovascular;  Laterality: N/A;  . TEMPOROMANDIBULAR JOINT SURGERY Right   . TOTAL KNEE ARTHROPLASTY Right 05/05/2018   Procedure: TOTAL KNEE ARTHROPLASTY;  Surgeon: [name], MD;  Location: [facility] ORS;  Service: Orthopedics;  Laterality: Right;    Allergies as of 03/31/2019      Reactions   Cymbalta [duloxetine Hcl] Other (See Comments)   Hallucinated and couldn't think; got worse and worse the longer she took this.      Medication List    Notice   This visit is during an admission. Changes to the med list made in this visit will be reflected in the After Visit Summary of the admission.    Current Outpatient Medications on File Prior to Visit  Medication Sig Dispense Refill  .  aspirin EC 81 MG EC tablet Take 1 tablet (81 mg total) by mouth daily with breakfast for 21 days. 21 tablet 0  . atorvastatin (LIPITOR) 40 MG tablet Take 1 tablet (40 mg total) by mouth daily at 6 PM. 30 tablet 0  . calcium carbonate (OSCAL) 1500 (600 Ca) MG TABS tablet Take 600 mg of elemental calcium by mouth daily.    . cholecalciferol (VITAMIN D) 1000 units tablet Take 1 tablet (1,000 Units total) by mouth daily. 30 tablet 11  . clopidogrel (PLAVIX) 75 MG tablet Take 1 tablet (75 mg total) by mouth daily. 30 tablet 2  . escitalopram (LEXAPRO) 20 MG tablet Take 1 tablet (20 mg total) by mouth daily. 30 tablet 2  . gabapentin (NEURONTIN) 300 MG capsule Take 300 mg by mouth at bedtime.   5  . Glycerin-Hypromellose-PEG 400 (HM DRY EYE RELIEF) 0.2-0.2-1 % SOLN Apply 1 drop to eye daily as needed (dry eyes).    Marland Kitchen letrozole (FEMARA) 2.5 MG tablet TAKE 1 TABLET(2.5 MG) BY MOUTH DAILY 30 tablet 3  . metFORMIN (GLUCOPHAGE) 500 MG tablet Take 500 mg by mouth 2 (two) times daily with a meal.     . NON FORMULARY Diet: Regular, NAS, Consistent Carbohydrate    . risperiDONE (RISPERDAL) 1 MG tablet Take 1 tablet (1 mg total) by mouth at bedtime. 30 tablet 2  . traZODone (DESYREL) 50 MG tablet TAKE 1 TABLET(50 MG) BY MOUTH AT BEDTIME 30 tablet 2  . venlafaxine XR (EFFEXOR-XR) 150 MG 24 hr capsule TAKE 1 CAPSULE(150 MG) BY MOUTH TWICE DAILY 60 capsule 2  . ONE TOUCH ULTRA TEST test strip USE TO TEST BLOOD SUGAR ONCE DAILY     No current facility-administered medications on file prior to visit.      No orders of the defined types were placed in this encounter.    There is no immunization history on file for this patient.  Social History   Tobacco Use  . Smoking status: Former Smoker    Packs/day: 0.50    Years: 40.00    Pack years: 20.00    Types: Cigarettes    Quit date: 09/04/1995    Years since quitting: 23.5  . Smokeless tobacco: Never Used  Substance Use Topics  . Alcohol use: No     Family history is   Family History  Problem Relation Age of Onset  . Anxiety disorder Mother   . Depression Mother   . Depression Maternal Grandmother   . Alcohol abuse Brother   . Cancer - Other Sister   . ADD / ADHD Neg Hx   . Bipolar disorder Neg Hx   . Dementia Neg Hx   . Drug abuse Neg Hx   . OCD Neg Hx   . Paranoid behavior Neg Hx   . Schizophrenia Neg Hx   . Seizures Neg Hx   . Sexual abuse  Neg Hx   . Physical abuse Neg Hx       Review of Systems  DATA OBTAINED: from patient, nurse GENERAL:  no fevers, fatigue, appetite changes SKIN: No itching, or rash EYES: No eye pain, redness, discharge EARS: No earache, tinnitus, change in hearing NOSE: No congestion, drainage or bleeding  MOUTH/THROAT: No mouth or tooth pain, No sore throat RESPIRATORY: No cough, wheezing, SOB CARDIAC: No chest pain, palpitations, lower extremity edema  GI: No abdominal pain, No N/V/D or constipation, No heartburn or reflux  GU: No dysuria, frequency or urgency, or incontinence  MUSCULOSKELETAL: No unrelieved bone/joint pain NEUROLOGIC: No headache, dizziness or focal weakness PSYCHIATRIC: No c/o anxiety or sadness   Vitals:   03/31/19 0922  BP: 113/83  Pulse: 76  Resp: 20  Temp: (!) 97.1 F (36.2 C)    SpO2 Readings from Last 1 Encounters:  03/26/19 99%   Body mass index is 29.11 kg/m.     Physical Exam  GENERAL APPEARANCE: Alert, conversant,  No acute distress.  SKIN: No diaphoresis rash HEAD: Normocephalic, atraumatic  EYES: Conjunctiva/lids clear. Pupils round, reactive. EOMs intact.  EARS: External exam WNL, canals clear. Hearing grossly normal.  NOSE: No deformity or discharge.  MOUTH/THROAT: Lips w/o lesions  RESPIRATORY: Breathing is even, unlabored. Lung sounds are clear   CARDIOVASCULAR: Heart RRR no murmurs, rubs or gallops. No peripheral edema.   GASTROINTESTINAL: Abdomen is soft, non-tender, not distended w/ normal bowel sounds. GENITOURINARY: Bladder non  tender, not distended  MUSCULOSKELETAL: No abnormal joints or musculature NEUROLOGIC:  Cranial nerves 2-12 grossly intact. Moves all extremities, mild right upper extremity weakness PSYCHIATRIC: Mood and affect appropriate to situation, no behavioral issues  Patient Active Problem List   Diagnosis Date Noted  . Cardiac LV ejection fraction 30-35% 03/29/2019  . Dyslipidemia associated with type 2 diabetes mellitus ([place]) 03/29/2019  . Major depression with psychotic features ([place]) 03/29/2019  . Ischemic stroke ([place]) 03/20/2019  . Edema of both lower legs due to peripheral venous insufficiency 10/12/2018  . Anemia 05/12/2018  . S/P total knee replacement, right 05/05/18 05/12/2018  . Type 2 diabetes mellitus with neurological complications ([place]) [id number]  . Primary osteoarthritis of right knee   . Status post surgery 01/26/2018  . Surgery, elective 01/26/2018  . Degenerative spondylolisthesis 01/26/2018  . Osteoporosis 12/13/2016  . Osteopenia 11/26/2016  . Invasive ductal carcinoma of breast, female, right ([place])   . Lumbago 03/22/2014  . Stiffness of joint, not elsewhere classified, pelvic region and thigh 03/22/2014  . Muscle weakness (generalized) 03/22/2014  . Abnormality of gait 03/22/2014  . Depression with anxiety 09/03/2012  . Insomnia secondary to depression with anxiety 09/03/2012  . Pain 09/03/2012      Labs reviewed: Basic Metabolic Panel:    Component Value Date/Time   NA 135 03/20/2019 1407   K 4.2 03/20/2019 1407   CL 102 03/20/2019 1407   CO2 25 03/20/2019 1348   GLUCOSE 113 (H) 03/20/2019 1407   BUN 19 03/20/2019 1407   CREATININE 0.80 03/20/2019 1407   CALCIUM 8.9 03/20/2019 1348   PROT 6.9 03/20/2019 1348   ALBUMIN 3.9 03/20/2019 1348   AST 19 03/20/2019 1348   ALT 12 03/20/2019 1348   ALKPHOS 66 03/20/2019 1348   BILITOT 0.6 03/20/2019 1348   GFRNONAA >60 03/20/2019 1348   GFRAA >60 03/20/2019 1348    Recent Labs    06/08/18 1253 12/01/18  1148 03/20/19 1348 03/20/19 1407  NA 140 137 136 135  K  4.5 3.8 4.2 4.2  CL 103 97* 101 102  CO2 27 28 25   --   GLUCOSE 134* 203* 115* 113*  BUN 21 21 19 19   CREATININE 0.84 1.01* 0.72 0.80  CALCIUM 9.5 9.1 8.9  --    Liver Function Tests: Recent Labs    06/08/18 1253 12/01/18 1148 03/20/19 1348  AST 19 14* 19  ALT 13 13 12   ALKPHOS 91 68 66  BILITOT 0.5 0.9 0.6  PROT 6.7 6.8 6.9  ALBUMIN 3.9 3.6 3.9   No results for input(s): LIPASE, AMYLASE in the last 8760 hours. No results for input(s): AMMONIA in the last 8760 hours. CBC: Recent Labs    06/08/18 1253 12/01/18 1148 03/20/19 1348 03/20/19 1407  WBC 7.1 8.4 10.6*  --   NEUTROABS 4.9 6.9 8.6*  --   HGB 13.7 12.0 14.3 14.3  HCT 44.0 36.3 43.9 42.0  MCV 94.0 91.0 91.6  --   PLT 269 223 226  --    Lipid Recent Labs    03/21/19 0647  CHOL 264*  HDL 76  LDLCALC 153*  TRIG 174*    Cardiac Enzymes: No results for input(s): CKTOTAL, CKMB, CKMBINDEX, TROPONINI in the last 8760 hours. BNP: No results for input(s): BNP in the last 8760 hours. No results found for: [facility] Lab Results  Component Value Date   HGBA1C 6.3 (H) 03/21/2019   No results found for: TSH No results found for: VITAMINB12 No results found for: FOLATE No results found for: IRON, TIBC, FERRITIN  Imaging and Procedures obtained prior to SNF admission: No results found.   Not all labs, radiology exams or other studies done during hospitalization come through on my EPIC note; however they are reviewed by me.    Assessment and Plan-  Left MCA stroke-CT of the head and neck shows no emergent large vessel occlusion, 2D echo with EF 35% without evidence of source of emboli.  30-day event monitoring advised by cards and will be set up by cards.  Antiplatelet therapy recommended patient start aspirin 325 for 3 weeks and Plavix 75 mg daily during and after the aspirin therapy.  LDL was 156 so patient was started on Lipitor 40 mg daily and  A1c is 6.3 on metformin alone which works good SNF- continue ASA 325 mg for 3 weeks, continue Plavix 75 mg daily indefinitely, continue Lipitor 40 mg daily  Diabetes mellitus type 2 SNF-A1c was 6.2; continue metformin 500 mg twice daily 8  Depression SNF-continue Lexapro 20 mg daily, trazodone 150 mg at bedtime Effexor 50 mg twice daily and Risperdal 1 mg nightly  Breast cancer SNF-status post right mastectomy; continue Femara 2.5 mg daily   Time spent greater than 45 minutes;> 50% of time with patient was spent reviewing records, labs, tests and studies, counseling and developing plan of care  Hennie Duos, MD

## 2019-04-04 ENCOUNTER — Encounter: Payer: Self-pay | Admitting: Internal Medicine

## 2019-04-06 ENCOUNTER — Encounter: Payer: Self-pay | Admitting: Adult Health

## 2019-04-06 ENCOUNTER — Non-Acute Institutional Stay (SKILLED_NURSING_FACILITY): Payer: Medicare HMO | Admitting: Adult Health

## 2019-04-06 DIAGNOSIS — R9430 Abnormal result of cardiovascular function study, unspecified: Secondary | ICD-10-CM

## 2019-04-06 DIAGNOSIS — I63512 Cerebral infarction due to unspecified occlusion or stenosis of left middle cerebral artery: Secondary | ICD-10-CM

## 2019-04-06 DIAGNOSIS — E1149 Type 2 diabetes mellitus with other diabetic neurological complication: Secondary | ICD-10-CM

## 2019-04-06 DIAGNOSIS — R931 Abnormal findings on diagnostic imaging of heart and coronary circulation: Secondary | ICD-10-CM

## 2019-04-06 NOTE — Progress Notes (Signed)
Location:    [facility] Room Number: 144/P Place of Service:  SNF (31)   CODE STATUS: Full Code  Allergies  Allergen Reactions  . Cymbalta [Duloxetine Hcl] Other (See Comments)    Hallucinated and couldn't think; got worse and worse the longer she took this.    Chief Complaint  Patient presents with  . Medical Management of Chronic Issues       Ischemic stroke: Cardiac LV ejection fraction 30-35% (03[birth year]22-49)   Type 2 diabetes mellitus with neurological complications:   Weekly follow up for the first 30 days post hospitalization.     HPI:  She is a 83 year old short term rehab patient being seen for the management of her chronic illnesses; stroke; cardiac function; diabetes. She denies any uncontrolled pain; no changes in her appetite; denies any insomnia or anxiety. She continues to participate in therapy.   Past Medical History:  Diagnosis Date  . Anxiety   . Arthritis   . Back pain   . Colon polyps   . Depression   . Diabetes mellitus, type II ([place])    diet controlled  . Hypercholesterolemia   . Invasive ductal carcinoma of breast, female, right ([place])   . Osteopenia 11/26/2016  . Skin-picking disorder    has 3 open wounds on right wrist that are being treated. About a quarter in size.    Past Surgical History:  Procedure Laterality Date  . APPENDECTOMY    . BREAST BIOPSY Right 10/09/2016   Procedure: BREAST BIOPSY WITH NEEDLE LOCALIZATION;  Surgeon: [name], MD;  Location: [facility];  Service: General;  Laterality: Right;  . BREAST SURGERY Right   . BUBBLE STUDY  03/23/2019   Procedure: BUBBLE STUDY;  Surgeon: [name], MD;  Location: [facility] ENDOSCOPY;  Service: Cardiovascular;;  . BUNIONECTOMY Bilateral   . FACIAL COSMETIC SURGERY     drooping eyelid and brow  . HERNIA REPAIR Right    Inguinal x2  . KNEE ARTHROSCOPY Right   . SHOULDER SURGERY Right    rotator cuff repair and removal of bone spur  . TEE WITHOUT CARDIOVERSION N/A  03/23/2019   Procedure: TRANSESOPHAGEAL ECHOCARDIOGRAM (TEE);  Surgeon: [name], MD;  Location: [facility] ENDOSCOPY;  Service: Cardiovascular;  Laterality: N/A;  . TEMPOROMANDIBULAR JOINT SURGERY Right   . TOTAL KNEE ARTHROPLASTY Right 05/05/2018   Procedure: TOTAL KNEE ARTHROPLASTY;  Surgeon: [name], MD;  Location: [facility];  Service: Orthopedics;  Laterality: Right;    Social History   Socioeconomic History  . Marital status: Divorced    Spouse name: Not on file  . Number of children: Not on file  . Years of education: Not on file  . Highest education level: Not on file  Occupational History  . 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 Financial resource strain: Not hard at all  . Food insecurity    Worry: Never true    Inability: Never true  . Transportation needs    Medical: No    Non-medical: No  Tobacco Use  . Smoking status: Former Smoker    Packs/day: 0.50    Years: 40.00    Pack years: 20.00    Types: Cigarettes    Quit date: 09/04/1995    Years since quitting: 23.6  . Smokeless tobacco: Never Used  Substance and Sexual Activity  . Alcohol use: No  . Drug use: No    Types: Benzodiazepines, Hydrocodone  .  Sexual activity: Never  Lifestyle  . Physical activity    Days per week: 0 days    Minutes per session: 0 min  . Stress: Only a little  Relationships  . Social Herbalist on phone: Once a week    Gets together: Never    Attends religious service: More than 4 times per year    Active member of club or organization: No    Attends meetings of clubs or organizations: Never    Relationship status: Divorced  . Intimate partner violence    Fear of current or ex partner: No    Emotionally abused: No    Physically abused: No    Forced sexual activity: No  Other Topics Concern  . Not on file  Social History Narrative   Divorced.  Former smoker, quit in 1997.  Nondrinker.    Family History  Problem Relation Age of Onset   . Anxiety disorder Mother   . Depression Mother   . Depression Maternal Grandmother   . Alcohol abuse Brother   . Cancer - Other Sister   . ADD / ADHD Neg Hx   . Bipolar disorder Neg Hx   . Dementia Neg Hx   . Drug abuse Neg Hx   . OCD Neg Hx   . Paranoid behavior Neg Hx   . Schizophrenia Neg Hx   . Seizures Neg Hx   . Sexual abuse Neg Hx   . Physical abuse Neg Hx       VITAL SIGNS BP (!) 142/81   Pulse 91   Temp (!) 97.5 F (36.4 C) (Oral)   Resp 20   Ht 5\' 4"  (1.626 m)   Wt 172 lb 6.4 oz (78.2 kg)   BMI 29.59 kg/m   Outpatient Encounter Medications as of 04/06/2019  Medication Sig  . aspirin EC 81 MG EC tablet Take 1 tablet (81 mg total) by mouth daily with breakfast for 21 days.  . calcium carbonate (OSCAL) 1500 (600 Ca) MG TABS tablet Take 600 mg of elemental calcium by mouth daily.  . cholecalciferol (VITAMIN D) 1000 units tablet Take 1 tablet (1,000 Units total) by mouth daily.  . Glycerin-Hypromellose-PEG 400 (HM DRY EYE RELIEF) 0.2-0.2-1 % SOLN Apply 1 drop to eye daily as needed (dry eyes).  . NON FORMULARY Diet: Regular, NAS, Consistent Carbohydrate  . ONE TOUCH ULTRA TEST test strip USE TO TEST BLOOD SUGAR ONCE DAILY  .  atorvastatin (LIPITOR) 40 MG tablet Take 1 tablet (40 mg total) by mouth daily at 6 PM.  .  clopidogrel (PLAVIX) 75 MG tablet Take 1 tablet (75 mg total) by mouth daily.  Marland Kitchen ] escitalopram (LEXAPRO) 20 MG tablet Take 1 tablet (20 mg total) by mouth daily.  Marland Kitchen  gabapentin (NEURONTIN) 300 MG capsule Take 300 mg by mouth at bedtime.   Marland Kitchen  letrozole (FEMARA) 2.5 MG tablet TAKE 1 TABLET(2.5 MG) BY MOUTH DAILY  .  metFORMIN (GLUCOPHAGE) 500 MG tablet Take 500 mg by mouth 2 (two) times daily with a meal.   .  risperiDONE (RISPERDAL) 1 MG tablet Take 1 tablet (1 mg total) by mouth at bedtime.  .  traZODone (DESYREL) 50 MG tablet TAKE 1 TABLET(50 MG) BY MOUTH AT BEDTIME  .  venlafaxine XR (EFFEXOR-XR) 150 MG 24 hr capsule TAKE 1 CAPSULE(150 MG) BY MOUTH  TWICE DAILY   No facility-administered encounter medications on file as of 04/06/2019.      SIGNIFICANT DIAGNOSTIC EXAMS  PREVIOUS;   03-20-19: ct of head:  1. No acute cortical or basal ganglia infarct. 2. Progression of diffuse white matter disease. Acute or subacute white matter infarct is not excluded. 3. Atherosclerosis 4. ASPECTS is 10/10  03-21-19: ct angio of head and neck:  1. No emergent large vessel occlusion. 2. Asymmetric prominence of MCA branch vessels over the left posterior frontal and parietal lobes. This may represent luxury perfusion following TIA or infarct. Gray-white differentiation is preserved without evidence of a significant cortical infarct. 3. Mild atherosclerotic changes at the aortic arch and right carotid bifurcation without significant stenosis. 4. Multilevel degenerative changes in the cervical spine as described. 5. Scattered ground-glass attenuation lung apices likely represents atelectasis or edema.  03-21-19: mri of brain:  1. 15 mm area of acute/subacute nonhemorrhagic infarct involving the left precentral gyrus, consistent with the patient's right upper extremity weakness. 2. Additional punctate area of acute/subacute nonhemorrhagic infarct involving the left parietal lobe. 3. Diffuse advanced periventricular and subcortical white matter changes bilaterally reflect the sequela of chronic microvascular ischemia and multiple remote white matter infarcts. 4. Degenerative changes of the cervical spine.  03-23-19: TEE echo:   1. The left ventricle has moderate-severely reduced systolic function, with an ejection fraction of 30-35%. The cavity size was normal. Left ventrical global hypokinesis without regional wall motion abnormalities.  2. There is no increase in right ventricular wall thickness.  3. Left atrial size was mildly dilated.  4. Small pericardial effusion.  5. The pericardial effusion is anterior to the right ventricle.  6. The MR jet is  posteriorly-directed.  7. Mildly restricted posterior MV leaflet.  8. Aortic valve regurgitation is trivial by color flow Doppler.  9. The aortic root and ascending aorta are normal in size and structure. 10. There is evidence of severe plaque in the descending aorta. 11. Small patent foramen ovale with predominantly right to left shunting across the atrial septum. 12. Evidence of atrial level shunting detected by color flow Doppler. 13. The interatrial septum appears to be lipomatous.  NO NEW EXAMS    LABS REVIEWED PREVIOUS;   03-20-19: wbc 10.6; hgb 14.3; hct 43.8; mcv 91.6; plt 226; glucose 115; bun 19; creat 0.72; k+ 4.2; na++ 136; ca 8.9; liver normal albumin 3.9 03-21-19: hgb a1c 6.3; chol 264; ldl 153; trig 174; hdl 76   NO NEW LABS.   Review of Systems  Constitutional: Negative for malaise/fatigue.  Respiratory: Negative for cough and shortness of breath.   Cardiovascular: Negative for chest pain, palpitations and leg swelling.  Gastrointestinal: Negative for abdominal pain, constipation and heartburn.  Musculoskeletal: Negative for back pain, joint pain and myalgias.  Skin: Negative.   Neurological: Negative for dizziness.  Psychiatric/Behavioral: The patient is not nervous/anxious.     Physical Exam Constitutional:      General: She is not in acute distress.    Appearance: She is well-developed. She is not diaphoretic.  Neck:     Musculoskeletal: Neck supple.     Thyroid: No thyromegaly.  Cardiovascular:     Rate and Rhythm: Normal rate and regular rhythm.     Pulses: Normal pulses.     Heart sounds: Normal heart sounds.  Pulmonary:     Effort: Pulmonary effort is normal. No respiratory distress.     Breath sounds: Normal breath sounds.  Chest:     Comments: History of right mastectomy  Abdominal:     General: Bowel sounds are normal. There is no distension.     Palpations:  Abdomen is soft.     Tenderness: There is no abdominal tenderness.  Musculoskeletal:      Right lower leg: No edema.     Left lower leg: No edema.     Comments:  Is able to move all extremities  has mild right upper extremity weakness    Lymphadenopathy:     Cervical: No cervical adenopathy.  Skin:    General: Skin is warm and dry.  Neurological:     Mental Status: She is alert and oriented to person, place, and time.  Psychiatric:        Mood and Affect: Mood normal.      ASSESSMENT/ PLAN:  TODAY;   1. Ischemic stroke: is neurologically stable: will continue plavix 75 mg daily and asa 81 mg daily will continue therapy as directed and will follow up with neurology   2. Cardiac LV ejection fraction 30-35% (03-22-49) will continue to monitor her status is compensated  3. Type 2 diabetes mellitus with neurological complications: is stable hgb a1c 6.1; will continue metformin 500 mg twice daily    PREVIOUS   4. Dyslipidemia associated with type 2 diabetes mellitus: is stable LDL 153 trig 174; will continue lipitor 40 mg daily   5. Degenerative spondylolisthesis: is stable will continue to monitor her status.   6. Invasive ductal carcinoma of breast female right: is status post right mastectomy (2018); will continue femera 2.5 mg daily   7. Major depression with psychotic features: is without change in her status: will continue lexapro 20 mg daily neurontin 300 mg nightly trazodone 50 mg nightly risperdal 1 mg nightly and xanax 0.5 mg twice daily as needed through 04-12-19.         MD is aware of resident's narcotic use and is in agreement with current plan of care. We will attempt to wean resident as appropriate.  Ok Edwards NP [facility]  Contact [phone] Monday through Friday 8am- 5pm  After hours call [phone]

## 2019-04-07 ENCOUNTER — Ambulatory Visit (HOSPITAL_COMMUNITY): Payer: Medicare HMO | Admitting: Psychiatry

## 2019-04-09 ENCOUNTER — Other Ambulatory Visit: Payer: Self-pay | Admitting: Adult Health

## 2019-04-09 ENCOUNTER — Encounter: Payer: Self-pay | Admitting: Adult Health

## 2019-04-09 ENCOUNTER — Other Ambulatory Visit (HOSPITAL_COMMUNITY): Payer: Self-pay | Admitting: Psychiatry

## 2019-04-09 ENCOUNTER — Non-Acute Institutional Stay (SKILLED_NURSING_FACILITY): Payer: Medicare HMO | Admitting: Adult Health

## 2019-04-09 DIAGNOSIS — E1149 Type 2 diabetes mellitus with other diabetic neurological complication: Secondary | ICD-10-CM

## 2019-04-09 DIAGNOSIS — E785 Hyperlipidemia, unspecified: Secondary | ICD-10-CM

## 2019-04-09 DIAGNOSIS — I63512 Cerebral infarction due to unspecified occlusion or stenosis of left middle cerebral artery: Secondary | ICD-10-CM

## 2019-04-09 DIAGNOSIS — E1169 Type 2 diabetes mellitus with other specified complication: Secondary | ICD-10-CM

## 2019-04-09 DIAGNOSIS — C50911 Malignant neoplasm of unspecified site of right female breast: Secondary | ICD-10-CM

## 2019-04-09 MED ORDER — VENLAFAXINE HCL ER 150 MG PO CP24: ORAL_CAPSULE | ORAL | 0 refills | Status: DC

## 2019-04-09 MED ORDER — ATORVASTATIN CALCIUM 40 MG PO TABS: 40.0000 mg | ORAL_TABLET | Freq: Every day | ORAL | 0 refills | Status: DC

## 2019-04-09 MED ORDER — GABAPENTIN 300 MG PO CAPS: 300.0000 mg | ORAL_CAPSULE | Freq: Every day | ORAL | 0 refills | Status: DC

## 2019-04-09 MED ORDER — LETROZOLE 2.5 MG PO TABS: ORAL_TABLET | ORAL | 0 refills | Status: DC

## 2019-04-09 MED ORDER — METFORMIN HCL 500 MG PO TABS: 500.0000 mg | ORAL_TABLET | Freq: Two times a day (BID) | ORAL | 0 refills | Status: DC

## 2019-04-09 MED ORDER — TRAZODONE HCL 50 MG PO TABS: ORAL_TABLET | ORAL | 0 refills | Status: DC

## 2019-04-09 MED ORDER — ESCITALOPRAM OXALATE 20 MG PO TABS: 20.0000 mg | ORAL_TABLET | Freq: Every day | ORAL | 0 refills | Status: DC

## 2019-04-09 MED ORDER — CLOPIDOGREL BISULFATE 75 MG PO TABS: 75.0000 mg | ORAL_TABLET | Freq: Every day | ORAL | 0 refills | Status: AC

## 2019-04-09 MED ORDER — RISPERIDONE 1 MG PO TABS: 1.0000 mg | ORAL_TABLET | Freq: Every day | ORAL | 0 refills | Status: DC

## 2019-04-09 NOTE — Progress Notes (Signed)
Location:    [place] Room Number: 144/P Place of Service:  SNF (31)    CODE STATUS: Full Code  Allergies  Allergen Reactions  . Cymbalta [Duloxetine Hcl] Other (See Comments)    Hallucinated and couldn't think; got worse and worse the longer she took this.    Chief Complaint  Patient presents with  . Discharge Note    Discharge Visit    HPI:  She is being discharged to home with home health for pt/ot. She will not need dme. She will need her prescriptions written and will need to follow up with her medical provider. She was hospitalized for an acuate cva. She was admitted to this facility for short term rehab; she has done well with therapy and is now ready to complete her therapy on a home health basis.     Past Medical History:  Diagnosis Date  . Anxiety   . Arthritis   . Back pain   . Colon polyps   . Depression   . Diabetes mellitus, type II ([place])    diet controlled  . Hypercholesterolemia   . Invasive ductal carcinoma of breast, female, right ([place])   . Osteopenia 11/26/2016  . Skin-picking disorder    has 3 open wounds on right wrist that are being treated. About a quarter in size.    Past Surgical History:  Procedure Laterality Date  . APPENDECTOMY    . BREAST BIOPSY Right 10/09/2016   Procedure: BREAST BIOPSY WITH NEEDLE LOCALIZATION;  Surgeon: [name], MD;  Location: [facility];  Service: General;  Laterality: Right;  . BREAST SURGERY Right   . BUBBLE STUDY  03/23/2019   Procedure: BUBBLE STUDY;  Surgeon: [name], MD;  Location: [facility] ENDOSCOPY;  Service: Cardiovascular;;  . BUNIONECTOMY Bilateral   . FACIAL COSMETIC SURGERY     drooping eyelid and brow  . HERNIA REPAIR Right    Inguinal x2  . KNEE ARTHROSCOPY Right   . SHOULDER SURGERY Right    rotator cuff repair and removal of bone spur  . TEE WITHOUT CARDIOVERSION N/A 03/23/2019   Procedure: TRANSESOPHAGEAL ECHOCARDIOGRAM (TEE);  Surgeon: [name], MD;   Location: [facility] ENDOSCOPY;  Service: Cardiovascular;  Laterality: N/A;  . TEMPOROMANDIBULAR JOINT SURGERY Right   . TOTAL KNEE ARTHROPLASTY Right 05/05/2018   Procedure: TOTAL KNEE ARTHROPLASTY;  Surgeon: [name], MD;  Location: [facility];  Service: Orthopedics;  Laterality: Right;    Social History   Socioeconomic History  . Marital status: Divorced    Spouse name: Not on file  . Number of children: Not on file  . Years of education: Not on file  . Highest education level: Not on file  Occupational History  . 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 Financial resource strain: Not hard at all  . Food insecurity    Worry: Never true    Inability: Never true  . Transportation needs    Medical: No    Non-medical: No  Tobacco Use  . Smoking status: Former Smoker    Packs/day: 0.50    Years: 40.00    Pack years: 20.00    Types: Cigarettes    Quit date: 09/04/1995    Years since quitting: 23.6  . Smokeless tobacco: Never Used  Substance and Sexual Activity  . Alcohol use: No  . Drug use: No    Types: Benzodiazepines, Hydrocodone  . Sexual activity: Never  Lifestyle  .  Physical activity    Days per week: 0 days    Minutes per session: 0 min  . Stress: Only a little  Relationships  . Social Herbalist on phone: Once a week    Gets together: Never    Attends religious service: More than 4 times per year    Active member of club or organization: No    Attends meetings of clubs or organizations: Never    Relationship status: Divorced  . Intimate partner violence    Fear of current or ex partner: No    Emotionally abused: No    Physically abused: No    Forced sexual activity: No  Other Topics Concern  . Not on file  Social History Narrative   Divorced.  Former smoker, quit in 1997.  Nondrinker.    Family History  Problem Relation Age of Onset  . Anxiety disorder Mother   . Depression Mother   . Depression Maternal Grandmother   .  Alcohol abuse Brother   . Cancer - Other Sister   . ADD / ADHD Neg Hx   . Bipolar disorder Neg Hx   . Dementia Neg Hx   . Drug abuse Neg Hx   . OCD Neg Hx   . Paranoid behavior Neg Hx   . Schizophrenia Neg Hx   . Seizures Neg Hx   . Sexual abuse Neg Hx   . Physical abuse Neg Hx     VITAL SIGNS BP (!) 133/54   Pulse 75   Temp (!) 97 F (36.1 C) (Oral)   Resp 20   Ht 5\' 4"  (1.626 m)   Wt 172 lb 6.4 oz (78.2 kg)   BMI 29.59 kg/m   Patient's Medications  New Prescriptions   No medications on file  Previous Medications   ACETAMINOPHEN (TYLENOL) 500 MG TABLET    Take 1,000 mg by mouth as needed.   ASPIRIN EC 81 MG EC TABLET    Take 1 tablet (81 mg total) by mouth daily with breakfast for 21 days.   ATORVASTATIN (LIPITOR) 40 MG TABLET    Take 1 tablet (40 mg total) by mouth daily at 6 PM.   CALCIUM CARBONATE (OSCAL) 1500 (600 CA) MG TABS TABLET    Take 600 mg of elemental calcium by mouth daily.   CHOLECALCIFEROL (VITAMIN D) 1000 UNITS TABLET    Take 1 tablet (1,000 Units total) by mouth daily.   CLOPIDOGREL (PLAVIX) 75 MG TABLET    Take 1 tablet (75 mg total) by mouth daily.   ESCITALOPRAM (LEXAPRO) 20 MG TABLET    Take 1 tablet (20 mg total) by mouth daily.   GABAPENTIN (NEURONTIN) 300 MG CAPSULE    Take 300 mg by mouth at bedtime.    GLYCERIN-HYPROMELLOSE-PEG 400 (HM DRY EYE RELIEF) 0.2-0.2-1 % SOLN    Apply 1 drop to eye daily as needed (dry eyes).   LETROZOLE (FEMARA) 2.5 MG TABLET    TAKE 1 TABLET(2.5 MG) BY MOUTH DAILY   METFORMIN (GLUCOPHAGE) 500 MG TABLET    Take 500 mg by mouth 2 (two) times daily with a meal.    NON FORMULARY    Diet: Regular, NAS, Consistent Carbohydrate   ONE TOUCH ULTRA TEST TEST STRIP    USE TO TEST BLOOD SUGAR ONCE DAILY   RISPERIDONE (RISPERDAL) 1 MG TABLET    Take 1 tablet (1 mg total) by mouth at bedtime.   TRAZODONE (DESYREL) 50 MG TABLET    TAKE  1 TABLET(50 MG) BY MOUTH AT BEDTIME   VENLAFAXINE XR (EFFEXOR-XR) 150 MG 24 HR CAPSULE    TAKE 1  CAPSULE(150 MG) BY MOUTH TWICE DAILY  Modified Medications   No medications on file  Discontinued Medications   No medications on file     SIGNIFICANT DIAGNOSTIC EXAMS   PREVIOUS;   03-20-19: ct of head:  1. No acute cortical or basal ganglia infarct. 2. Progression of diffuse white matter disease. Acute or subacute white matter infarct is not excluded. 3. Atherosclerosis 4. ASPECTS is 10/10  03-21-19: ct angio of head and neck:  1. No emergent large vessel occlusion. 2. Asymmetric prominence of MCA branch vessels over the left posterior frontal and parietal lobes. This may represent luxury perfusion following TIA or infarct. Gray-white differentiation is preserved without evidence of a significant cortical infarct. 3. Mild atherosclerotic changes at the aortic arch and right carotid bifurcation without significant stenosis. 4. Multilevel degenerative changes in the cervical spine as described. 5. Scattered ground-glass attenuation lung apices likely represents atelectasis or edema.  03-21-19: mri of brain:  1. 15 mm area of acute/subacute nonhemorrhagic infarct involving the left precentral gyrus, consistent with the patient's right upper extremity weakness. 2. Additional punctate area of acute/subacute nonhemorrhagic infarct involving the left parietal lobe. 3. Diffuse advanced periventricular and subcortical white matter changes bilaterally reflect the sequela of chronic microvascular ischemia and multiple remote white matter infarcts. 4. Degenerative changes of the cervical spine.  03-23-19: TEE echo:   1. The left ventricle has moderate-severely reduced systolic function, with an ejection fraction of 30-35%. The cavity size was normal. Left ventrical global hypokinesis without regional wall motion abnormalities.  2. There is no increase in right ventricular wall thickness.  3. Left atrial size was mildly dilated.  4. Small pericardial effusion.  5. The pericardial effusion is  anterior to the right ventricle.  6. The MR jet is posteriorly-directed.  7. Mildly restricted posterior MV leaflet.  8. Aortic valve regurgitation is trivial by color flow Doppler.  9. The aortic root and ascending aorta are normal in size and structure. 10. There is evidence of severe plaque in the descending aorta. 11. Small patent foramen ovale with predominantly right to left shunting across the atrial septum. 12. Evidence of atrial level shunting detected by color flow Doppler. 13. The interatrial septum appears to be lipomatous.  NO NEW EXAMS    LABS REVIEWED PREVIOUS;   03-20-19: wbc 10.6; hgb 14.3; hct 43.8; mcv 91.6; plt 226; glucose 115; bun 19; creat 0.72; k+ 4.2; na++ 136; ca 8.9; liver normal albumin 3.9 03-21-19: hgb a1c 6.3; chol 264; ldl 153; trig 174; hdl 76   NO NEW LABS.   Review of Systems  Constitutional: Negative for malaise/fatigue.  Respiratory: Negative for cough and shortness of breath.   Cardiovascular: Negative for chest pain, palpitations and leg swelling.  Gastrointestinal: Negative for abdominal pain, constipation and heartburn.  Musculoskeletal: Negative for back pain, joint pain and myalgias.  Skin: Negative.   Neurological: Negative for dizziness.  Psychiatric/Behavioral: The patient is not nervous/anxious.     Physical Exam Constitutional:      General: She is not in acute distress.    Appearance: She is well-developed. She is not diaphoretic.  Neck:     Musculoskeletal: Neck supple.     Thyroid: No thyromegaly.  Cardiovascular:     Rate and Rhythm: Normal rate and regular rhythm.     Pulses: Normal pulses.     Heart sounds: Normal  heart sounds.  Pulmonary:     Effort: Pulmonary effort is normal. No respiratory distress.     Breath sounds: Normal breath sounds.  Chest:     Comments: History of right mastectomy  Abdominal:     General: Bowel sounds are normal. There is no distension.     Palpations: Abdomen is soft.     Tenderness:  There is no abdominal tenderness.  Musculoskeletal: Normal range of motion.     Right lower leg: No edema.     Left lower leg: No edema.  Lymphadenopathy:     Cervical: No cervical adenopathy.  Skin:    General: Skin is warm and dry.  Neurological:     Mental Status: She is alert and oriented to person, place, and time.  Psychiatric:        Mood and Affect: Mood normal.     ASSESSMENT/ PLAN:  Patient is being discharged with the following home health services:pt/ot to evaluate and treat as indicated for gait balance strength adl training.     Patient is being discharged with the following durable medical equipment:  None needed   Patient has been advised to f/u with their PCP in 1-2 weeks to bring them up to date on their rehab stay.  Social services at facility was responsible for arranging this appointment.  Pt was provided with a 30 day supply of prescriptions for medications and refills must be obtained from their PCP.  For controlled substances, a more limited supply may be provided adequate until PCP appointment only.  A 30 day supply of her prescription medications have been sent to [facility] on [address]   Time spent with patient: 35 minutes: medications; home health dme.    Ok Edwards NP [facility]  Contact [phone] Monday through Friday 8am- 5pm  After hours call [phone]

## 2019-04-16 ENCOUNTER — Other Ambulatory Visit (HOSPITAL_COMMUNITY): Payer: Self-pay | Admitting: Psychiatry

## 2019-04-16 DIAGNOSIS — F330 Major depressive disorder, recurrent, mild: Secondary | ICD-10-CM

## 2019-04-16 DIAGNOSIS — E119 Type 2 diabetes mellitus without complications: Secondary | ICD-10-CM | POA: Diagnosis not present

## 2019-04-16 DIAGNOSIS — E7800 Pure hypercholesterolemia, unspecified: Secondary | ICD-10-CM | POA: Diagnosis not present

## 2019-04-16 DIAGNOSIS — M8580 Other specified disorders of bone density and structure, unspecified site: Secondary | ICD-10-CM | POA: Diagnosis not present

## 2019-04-16 DIAGNOSIS — M1990 Unspecified osteoarthritis, unspecified site: Secondary | ICD-10-CM | POA: Diagnosis not present

## 2019-04-16 DIAGNOSIS — R69 Illness, unspecified: Secondary | ICD-10-CM | POA: Diagnosis not present

## 2019-04-16 DIAGNOSIS — Z87891 Personal history of nicotine dependence: Secondary | ICD-10-CM | POA: Diagnosis not present

## 2019-04-16 DIAGNOSIS — C50911 Malignant neoplasm of unspecified site of right female breast: Secondary | ICD-10-CM | POA: Diagnosis not present

## 2019-04-16 DIAGNOSIS — I69351 Hemiplegia and hemiparesis following cerebral infarction affecting right dominant side: Secondary | ICD-10-CM | POA: Diagnosis not present

## 2019-04-20 ENCOUNTER — Encounter (HOSPITAL_COMMUNITY): Payer: Self-pay | Admitting: Emergency Medicine

## 2019-04-20 ENCOUNTER — Other Ambulatory Visit: Payer: Self-pay

## 2019-04-20 ENCOUNTER — Emergency Department (HOSPITAL_COMMUNITY)
Admission: EM | Admit: 2019-04-20 | Discharge: 2019-04-20 | Disposition: A | Discharge status: Home / Self Care | Payer: Medicare HMO | Attending: Emergency Medicine | Admitting: Emergency Medicine

## 2019-04-20 DIAGNOSIS — M8580 Other specified disorders of bone density and structure, unspecified site: Secondary | ICD-10-CM | POA: Diagnosis not present

## 2019-04-20 DIAGNOSIS — Z5321 Procedure and treatment not carried out due to patient leaving prior to being seen by health care provider: Secondary | ICD-10-CM | POA: Diagnosis not present

## 2019-04-20 DIAGNOSIS — M5489 Other dorsalgia: Secondary | ICD-10-CM | POA: Insufficient documentation

## 2019-04-20 DIAGNOSIS — I69351 Hemiplegia and hemiparesis following cerebral infarction affecting right dominant side: Secondary | ICD-10-CM | POA: Diagnosis not present

## 2019-04-20 DIAGNOSIS — C50911 Malignant neoplasm of unspecified site of right female breast: Secondary | ICD-10-CM | POA: Diagnosis not present

## 2019-04-20 DIAGNOSIS — E119 Type 2 diabetes mellitus without complications: Secondary | ICD-10-CM | POA: Diagnosis not present

## 2019-04-20 DIAGNOSIS — Z87891 Personal history of nicotine dependence: Secondary | ICD-10-CM | POA: Diagnosis not present

## 2019-04-20 DIAGNOSIS — R69 Illness, unspecified: Secondary | ICD-10-CM | POA: Diagnosis not present

## 2019-04-20 DIAGNOSIS — E7800 Pure hypercholesterolemia, unspecified: Secondary | ICD-10-CM | POA: Diagnosis not present

## 2019-04-20 DIAGNOSIS — M1990 Unspecified osteoarthritis, unspecified site: Secondary | ICD-10-CM | POA: Diagnosis not present

## 2019-04-20 NOTE — ED Notes (Signed)
Spoke with pt's son on the phone. Son requesting an update and inquiring about the wait time for pt. Explained to son that pt was not forgot about and that unfortunately there were no open beds at this time and she had others ahead of her. Son verbalized understanding.

## 2019-04-20 NOTE — ED Triage Notes (Addendum)
Pt reports two recent falls at home recently. Pt recently discharged from [facility]. Pt c/o back pain and generalized aching from falling. Pt noted to have bruising on forearms. Pt states she did hit her head, but denies LOC.

## 2019-04-21 ENCOUNTER — Ambulatory Visit (HOSPITAL_COMMUNITY): Payer: Medicare HMO | Admitting: Psychiatry

## 2019-04-21 ENCOUNTER — Other Ambulatory Visit (HOSPITAL_COMMUNITY): Payer: Self-pay | Admitting: Pulmonary Disease

## 2019-04-21 ENCOUNTER — Ambulatory Visit (HOSPITAL_COMMUNITY)
Admission: RE | Admit: 2019-04-21 | Discharge: 2019-04-21 | Disposition: A | Discharge status: Home / Self Care | Payer: Medicare HMO | Source: Ambulatory Visit | Attending: Pulmonary Disease | Admitting: Pulmonary Disease

## 2019-04-21 DIAGNOSIS — Z23 Encounter for immunization: Secondary | ICD-10-CM | POA: Diagnosis not present

## 2019-04-21 DIAGNOSIS — S99911A Unspecified injury of right ankle, initial encounter: Secondary | ICD-10-CM | POA: Diagnosis not present

## 2019-04-21 DIAGNOSIS — M25571 Pain in right ankle and joints of right foot: Secondary | ICD-10-CM | POA: Insufficient documentation

## 2019-04-21 DIAGNOSIS — M5440 Lumbago with sciatica, unspecified side: Secondary | ICD-10-CM | POA: Diagnosis present

## 2019-04-21 DIAGNOSIS — I639 Cerebral infarction, unspecified: Secondary | ICD-10-CM | POA: Diagnosis not present

## 2019-04-21 DIAGNOSIS — M545 Low back pain: Secondary | ICD-10-CM | POA: Diagnosis not present

## 2019-04-21 DIAGNOSIS — R69 Illness, unspecified: Secondary | ICD-10-CM | POA: Diagnosis not present

## 2019-04-21 DIAGNOSIS — E119 Type 2 diabetes mellitus without complications: Secondary | ICD-10-CM | POA: Diagnosis not present

## 2019-04-22 ENCOUNTER — Encounter (HOSPITAL_COMMUNITY): Payer: Self-pay | Admitting: Psychiatry

## 2019-04-22 ENCOUNTER — Ambulatory Visit (INDEPENDENT_AMBULATORY_CARE_PROVIDER_SITE_OTHER): Payer: Medicare HMO | Admitting: Psychiatry

## 2019-04-22 ENCOUNTER — Other Ambulatory Visit: Payer: Self-pay

## 2019-04-22 DIAGNOSIS — F330 Major depressive disorder, recurrent, mild: Secondary | ICD-10-CM

## 2019-04-22 DIAGNOSIS — M1990 Unspecified osteoarthritis, unspecified site: Secondary | ICD-10-CM | POA: Diagnosis not present

## 2019-04-22 DIAGNOSIS — R69 Illness, unspecified: Secondary | ICD-10-CM | POA: Diagnosis not present

## 2019-04-22 DIAGNOSIS — E119 Type 2 diabetes mellitus without complications: Secondary | ICD-10-CM | POA: Diagnosis not present

## 2019-04-22 DIAGNOSIS — M8580 Other specified disorders of bone density and structure, unspecified site: Secondary | ICD-10-CM | POA: Diagnosis not present

## 2019-04-22 DIAGNOSIS — C50911 Malignant neoplasm of unspecified site of right female breast: Secondary | ICD-10-CM | POA: Diagnosis not present

## 2019-04-22 DIAGNOSIS — Z87891 Personal history of nicotine dependence: Secondary | ICD-10-CM | POA: Diagnosis not present

## 2019-04-22 DIAGNOSIS — I69351 Hemiplegia and hemiparesis following cerebral infarction affecting right dominant side: Secondary | ICD-10-CM | POA: Diagnosis not present

## 2019-04-22 DIAGNOSIS — E7800 Pure hypercholesterolemia, unspecified: Secondary | ICD-10-CM | POA: Diagnosis not present

## 2019-04-22 MED ORDER — TRAZODONE HCL 50 MG PO TABS: ORAL_TABLET | ORAL | 2 refills | Status: DC

## 2019-04-22 MED ORDER — ESCITALOPRAM OXALATE 20 MG PO TABS: 20.0000 mg | ORAL_TABLET | Freq: Every day | ORAL | 2 refills | Status: DC

## 2019-04-22 MED ORDER — RISPERIDONE 1 MG PO TABS: 1.0000 mg | ORAL_TABLET | Freq: Every day | ORAL | 2 refills | Status: DC

## 2019-04-22 MED ORDER — VENLAFAXINE HCL ER 150 MG PO CP24: ORAL_CAPSULE | ORAL | 2 refills | Status: DC

## 2019-04-22 MED ORDER — GABAPENTIN 300 MG PO CAPS: 300.0000 mg | ORAL_CAPSULE | Freq: Every day | ORAL | 2 refills | Status: DC
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
## 2019-04-26 DIAGNOSIS — I69351 Hemiplegia and hemiparesis following cerebral infarction affecting right dominant side: Secondary | ICD-10-CM | POA: Diagnosis not present

## 2019-04-26 DIAGNOSIS — Z87891 Personal history of nicotine dependence: Secondary | ICD-10-CM | POA: Diagnosis not present

## 2019-04-26 DIAGNOSIS — M1990 Unspecified osteoarthritis, unspecified site: Secondary | ICD-10-CM | POA: Diagnosis not present

## 2019-04-26 DIAGNOSIS — M8580 Other specified disorders of bone density and structure, unspecified site: Secondary | ICD-10-CM | POA: Diagnosis not present

## 2019-04-26 DIAGNOSIS — E119 Type 2 diabetes mellitus without complications: Secondary | ICD-10-CM | POA: Diagnosis not present

## 2019-04-26 DIAGNOSIS — E7800 Pure hypercholesterolemia, unspecified: Secondary | ICD-10-CM | POA: Diagnosis not present

## 2019-04-26 DIAGNOSIS — R69 Illness, unspecified: Secondary | ICD-10-CM | POA: Diagnosis not present

## 2019-04-26 DIAGNOSIS — C50911 Malignant neoplasm of unspecified site of right female breast: Secondary | ICD-10-CM | POA: Diagnosis not present

## 2019-04-27 DIAGNOSIS — E7800 Pure hypercholesterolemia, unspecified: Secondary | ICD-10-CM | POA: Diagnosis not present

## 2019-04-27 DIAGNOSIS — M8580 Other specified disorders of bone density and structure, unspecified site: Secondary | ICD-10-CM | POA: Diagnosis not present

## 2019-04-27 DIAGNOSIS — M1990 Unspecified osteoarthritis, unspecified site: Secondary | ICD-10-CM | POA: Diagnosis not present

## 2019-04-27 DIAGNOSIS — Z87891 Personal history of nicotine dependence: Secondary | ICD-10-CM | POA: Diagnosis not present

## 2019-04-27 DIAGNOSIS — C50911 Malignant neoplasm of unspecified site of right female breast: Secondary | ICD-10-CM | POA: Diagnosis not present

## 2019-04-27 DIAGNOSIS — E119 Type 2 diabetes mellitus without complications: Secondary | ICD-10-CM | POA: Diagnosis not present

## 2019-04-27 DIAGNOSIS — I69351 Hemiplegia and hemiparesis following cerebral infarction affecting right dominant side: Secondary | ICD-10-CM | POA: Diagnosis not present

## 2019-04-27 DIAGNOSIS — R69 Illness, unspecified: Secondary | ICD-10-CM | POA: Diagnosis not present

## 2019-04-28 DIAGNOSIS — I639 Cerebral infarction, unspecified: Secondary | ICD-10-CM | POA: Diagnosis not present

## 2019-04-28 DIAGNOSIS — R69 Illness, unspecified: Secondary | ICD-10-CM | POA: Diagnosis not present

## 2019-04-28 DIAGNOSIS — M545 Low back pain: Secondary | ICD-10-CM | POA: Diagnosis not present

## 2019-04-28 DIAGNOSIS — I10 Essential (primary) hypertension: Secondary | ICD-10-CM | POA: Diagnosis not present

## 2019-04-29 DIAGNOSIS — C50911 Malignant neoplasm of unspecified site of right female breast: Secondary | ICD-10-CM | POA: Diagnosis not present

## 2019-04-29 DIAGNOSIS — Z87891 Personal history of nicotine dependence: Secondary | ICD-10-CM | POA: Diagnosis not present

## 2019-04-29 DIAGNOSIS — R69 Illness, unspecified: Secondary | ICD-10-CM | POA: Diagnosis not present

## 2019-04-29 DIAGNOSIS — E119 Type 2 diabetes mellitus without complications: Secondary | ICD-10-CM | POA: Diagnosis not present

## 2019-04-29 DIAGNOSIS — M8580 Other specified disorders of bone density and structure, unspecified site: Secondary | ICD-10-CM | POA: Diagnosis not present

## 2019-04-29 DIAGNOSIS — M1990 Unspecified osteoarthritis, unspecified site: Secondary | ICD-10-CM | POA: Diagnosis not present

## 2019-04-29 DIAGNOSIS — E7800 Pure hypercholesterolemia, unspecified: Secondary | ICD-10-CM | POA: Diagnosis not present

## 2019-04-29 DIAGNOSIS — I69351 Hemiplegia and hemiparesis following cerebral infarction affecting right dominant side: Secondary | ICD-10-CM | POA: Diagnosis not present

## 2019-04-30 DIAGNOSIS — E7800 Pure hypercholesterolemia, unspecified: Secondary | ICD-10-CM | POA: Diagnosis not present

## 2019-04-30 DIAGNOSIS — R69 Illness, unspecified: Secondary | ICD-10-CM | POA: Diagnosis not present

## 2019-04-30 DIAGNOSIS — Z87891 Personal history of nicotine dependence: Secondary | ICD-10-CM | POA: Diagnosis not present

## 2019-04-30 DIAGNOSIS — E119 Type 2 diabetes mellitus without complications: Secondary | ICD-10-CM | POA: Diagnosis not present

## 2019-04-30 DIAGNOSIS — I69351 Hemiplegia and hemiparesis following cerebral infarction affecting right dominant side: Secondary | ICD-10-CM | POA: Diagnosis not present

## 2019-04-30 DIAGNOSIS — C50911 Malignant neoplasm of unspecified site of right female breast: Secondary | ICD-10-CM | POA: Diagnosis not present

## 2019-04-30 DIAGNOSIS — M8580 Other specified disorders of bone density and structure, unspecified site: Secondary | ICD-10-CM | POA: Diagnosis not present

## 2019-04-30 DIAGNOSIS — M1990 Unspecified osteoarthritis, unspecified site: Secondary | ICD-10-CM | POA: Diagnosis not present

## 2019-05-03 DIAGNOSIS — E7800 Pure hypercholesterolemia, unspecified: Secondary | ICD-10-CM | POA: Diagnosis not present

## 2019-05-03 DIAGNOSIS — Z87891 Personal history of nicotine dependence: Secondary | ICD-10-CM | POA: Diagnosis not present

## 2019-05-03 DIAGNOSIS — C50911 Malignant neoplasm of unspecified site of right female breast: Secondary | ICD-10-CM | POA: Diagnosis not present

## 2019-05-03 DIAGNOSIS — I69351 Hemiplegia and hemiparesis following cerebral infarction affecting right dominant side: Secondary | ICD-10-CM | POA: Diagnosis not present

## 2019-05-03 DIAGNOSIS — E119 Type 2 diabetes mellitus without complications: Secondary | ICD-10-CM | POA: Diagnosis not present

## 2019-05-03 DIAGNOSIS — M1990 Unspecified osteoarthritis, unspecified site: Secondary | ICD-10-CM | POA: Diagnosis not present

## 2019-05-03 DIAGNOSIS — R69 Illness, unspecified: Secondary | ICD-10-CM | POA: Diagnosis not present

## 2019-05-03 DIAGNOSIS — M8580 Other specified disorders of bone density and structure, unspecified site: Secondary | ICD-10-CM | POA: Diagnosis not present

## 2019-05-05 DIAGNOSIS — Z87891 Personal history of nicotine dependence: Secondary | ICD-10-CM | POA: Diagnosis not present

## 2019-05-05 DIAGNOSIS — C50911 Malignant neoplasm of unspecified site of right female breast: Secondary | ICD-10-CM | POA: Diagnosis not present

## 2019-05-05 DIAGNOSIS — M1990 Unspecified osteoarthritis, unspecified site: Secondary | ICD-10-CM | POA: Diagnosis not present

## 2019-05-05 DIAGNOSIS — R69 Illness, unspecified: Secondary | ICD-10-CM | POA: Diagnosis not present

## 2019-05-05 DIAGNOSIS — E7800 Pure hypercholesterolemia, unspecified: Secondary | ICD-10-CM | POA: Diagnosis not present

## 2019-05-05 DIAGNOSIS — I69351 Hemiplegia and hemiparesis following cerebral infarction affecting right dominant side: Secondary | ICD-10-CM | POA: Diagnosis not present

## 2019-05-05 DIAGNOSIS — E119 Type 2 diabetes mellitus without complications: Secondary | ICD-10-CM | POA: Diagnosis not present

## 2019-05-05 DIAGNOSIS — M8580 Other specified disorders of bone density and structure, unspecified site: Secondary | ICD-10-CM | POA: Diagnosis not present

## 2019-05-06 ENCOUNTER — Ambulatory Visit: Payer: Medicare HMO | Admitting: Neurology

## 2019-05-06 ENCOUNTER — Encounter: Payer: Self-pay | Admitting: Neurology

## 2019-05-06 ENCOUNTER — Other Ambulatory Visit: Payer: Self-pay

## 2019-05-06 VITALS — BP 129/62 | HR 69 | Temp 97.6°F | Ht 64.0 in | Wt 167.4 lb

## 2019-05-06 DIAGNOSIS — E7849 Other hyperlipidemia: Secondary | ICD-10-CM | POA: Diagnosis not present

## 2019-05-06 NOTE — Patient Instructions (Signed)
I had a long d/w patient and her sitter about her recent cryptogenic stroke, risk for recurrent stroke/TIAs, personally independently reviewed imaging studies and stroke evaluation results and answered questions.Continue aspirin 81 mg daily but stop plavix as she is bruising easily and it has been greater than 3 weeks for secondary stroke prevention and maintain strict control of hypertension with blood pressure goal below 130/90, diabetes with hemoglobin A1c goal below 6.5% and lipids with LDL cholesterol goal below 70 mg/dL. I also advised the patient to eat a healthy diet with plenty of whole grains, cereals, fruits and vegetables, exercise regularly and maintain ideal body weight . Check follow up lipid profile today. She was encouraged to use a walker for ambulation and safety purposes.Followup in the future with my nurse practitioner Janett Billow in 3 months or call earlier if necessary.  Stroke Prevention Some medical conditions and behaviors are associated with a higher chance of having a stroke. You can help prevent a stroke by making nutrition, lifestyle, and other changes, including managing any medical conditions you may have. What nutrition changes can be made?   Eat healthy foods. You can do this by: ? Choosing foods high in fiber, such as fresh fruits and vegetables and whole grains. ? Eating at least 5 or more servings of fruits and vegetables a day. Try to fill half of your plate at each meal with fruits and vegetables. ? Choosing lean protein foods, such as lean cuts of meat, poultry without skin, fish, tofu, beans, and nuts. ? Eating low-fat dairy products. ? Avoiding foods that are high in salt (sodium). This can help lower blood pressure. ? Avoiding foods that have saturated fat, trans fat, and cholesterol. This can help prevent high cholesterol. ? Avoiding processed and premade foods.  Follow your health care provider's specific guidelines for losing weight, controlling high blood  pressure (hypertension), lowering high cholesterol, and managing diabetes. These may include: ? Reducing your daily calorie intake. ? Limiting your daily sodium intake to 1,500 milligrams (mg). ? Using only healthy fats for cooking, such as olive oil, canola oil, or sunflower oil. ? Counting your daily carbohydrate intake. What lifestyle changes can be made?  Maintain a healthy weight. Talk to your health care provider about your ideal weight.  Get at least 30 minutes of moderate physical activity at least 5 days a week. Moderate activity includes brisk walking, biking, and swimming.  Do not use any products that contain nicotine or tobacco, such as cigarettes and e-cigarettes. If you need help quitting, ask your health care provider. It may also be helpful to avoid exposure to secondhand smoke.  Limit alcohol intake to no more than 1 drink a day for nonpregnant women and 2 drinks a day for men. One drink equals 12 oz of beer, 5 oz of wine, or 1 oz of hard liquor.  Stop any illegal drug use.  Avoid taking birth control pills. Talk to your health care provider about the risks of taking birth control pills if: ? You are over 65 years old. ? You smoke. ? You get migraines. ? You have ever had a blood clot. What other changes can be made?  Manage your cholesterol levels. ? Eating a healthy diet is important for preventing high cholesterol. If cholesterol cannot be managed through diet alone, you may also need to take medicines. ? Take any prescribed medicines to control your cholesterol as told by your health care provider.  Manage your diabetes. ? Eating a healthy diet and  exercising regularly are important parts of managing your blood sugar. If your blood sugar cannot be managed through diet and exercise, you may need to take medicines. ? Take any prescribed medicines to control your diabetes as told by your health care provider.  Control your hypertension. ? To reduce your risk of  stroke, try to keep your blood pressure below 130/80. ? Eating a healthy diet and exercising regularly are an important part of controlling your blood pressure. If your blood pressure cannot be managed through diet and exercise, you may need to take medicines. ? Take any prescribed medicines to control hypertension as told by your health care provider. ? Ask your health care provider if you should monitor your blood pressure at home. ? Have your blood pressure checked every year, even if your blood pressure is normal. Blood pressure increases with age and some medical conditions.  Get evaluated for sleep disorders (sleep apnea). Talk to your health care provider about getting a sleep evaluation if you snore a lot or have excessive sleepiness.  Take over-the-counter and prescription medicines only as told by your health care provider. Aspirin or blood thinners (antiplatelets or anticoagulants) may be recommended to reduce your risk of forming blood clots that can lead to stroke.  Make sure that any other medical conditions you have, such as atrial fibrillation or atherosclerosis, are managed. What are the warning signs of a stroke? The warning signs of a stroke can be easily remembered as BEFAST.  B is for balance. Signs include: ? Dizziness. ? Loss of balance or coordination. ? Sudden trouble walking.  E is for eyes. Signs include: ? A sudden change in vision. ? Trouble seeing.  F is for face. Signs include: ? Sudden weakness or numbness of the face. ? The face or eyelid drooping to one side.  A is for arms. Signs include: ? Sudden weakness or numbness of the arm, usually on one side of the body.  S is for speech. Signs include: ? Trouble speaking (aphasia). ? Trouble understanding.  T is for time. ? These symptoms may represent a serious problem that is an emergency. Do not wait to see if the symptoms will go away. Get medical help right away. Call your local emergency services  (911 in the [place] Do not drive yourself to the hospital.  Other signs of stroke may include: ? A sudden, severe headache with no known cause. ? Nausea or vomiting. ? Seizure. Where to find more information For more information, visit:  American Stroke Association: www.strokeassociation.org  National Stroke Association: www.stroke.org Summary  You can prevent a stroke by eating healthy, exercising, not smoking, limiting alcohol intake, and managing any medical conditions you may have.  Do not use any products that contain nicotine or tobacco, such as cigarettes and e-cigarettes. If you need help quitting, ask your health care provider. It may also be helpful to avoid exposure to secondhand smoke.  Remember BEFAST for warning signs of stroke. Get help right away if you or a loved one has any of these signs. This information is not intended to replace advice given to you by your health care provider. Make sure you discuss any questions you have with your health care provider. Document Released: 08/22/2004 Document Revised: 06/27/2017 Document Reviewed: 08/20/2016 Elsevier Patient Education  2020 [employer].

## 2019-05-06 NOTE — Progress Notes (Signed)
[facility] [address] [place]. [place] [address] [phone]       OFFICE CONSULTATION NOTE  Ms. SANAIA PINILLA Date of Birth:  [birth year] Medical Record Number:  [id number]   Referring physician Dr. Rosalin Hawking Reason for referral stroke HPI: Ms. Fasick is a 83 year old Caucasian lady seen today for initial office consultation visit.  She is accompanied by her sitter.  History is obtained from her and review of electronic medical records.  I personally reviewed imaging films in PACS.  She is 83 year old pleasant Caucasian lady with past medical history of hyperlipidemia, diabetes, hypertension, right breast cancer stage Ia status post surgery and on chemotherapy with Femara, anxiety, depression, osteopenia who presented to [facility] emergency room on 03/20/2019 after she was found on the floor with right-sided weakness.  Patient son noted that she had right arm weakness and had trouble walking and was dragging her right leg to.  CT scan of the head showed no acute infarct.  She was not given TPA due to late presentation.  MRI scan of the brain showed small acute infarcts involving left precentral gyrus and left superior parietal lobe which were felt to be embolic.  CT angiogram of the brain and neck both did not show significant large vessel stenosis or occlusion.  2D echo showed diminished ejection fraction of 35 to 40% with diffuse hypokinesis but no clot.  TEE showed no evidence of clot but did show severe aortic arch atheromatous and small PFO.  Outpatient 30-day heart monitoring was recommended but it has not been done but she does have an appointment to see a cardiologist next week for the same I presume.  LDL cholesterol was elevated at 153 mg percent and hemoglobin A1c was 6.3.  Urine drug screen was negative.  She was started on aspirin and Plavix for 3 weeks and she states she is tolerated well without major bleeding but does bruise easily.  She has been  getting home physical occupational therapy and has obtain improvement in the right hand strength though dexterity is still not back to normal and fine motor skills are diminished.  She is able to walk with a walker but does favor her right leg due to knee pain and uses a walker and has had no falls.  Her blood pressures well controlled today it is 129/62.  Her fasting sugars have all ranged in the 120s.  She has tolerated Lipitor 40 mg daily but has not had follow-up lipid profile checked yet.  She lives alone her son lives nearby. ROS:   14 system review of systems is positive for hand weakness, decreased dexterity, knee pain, gait imbalance, decreased hearing, increased bruising and all other systems negative PMH:  Past Medical History:  Diagnosis Date   Anxiety    Arthritis    Back pain    Colon polyps    Depression    Diabetes mellitus, type II ([place])    diet controlled   Hypercholesterolemia    Invasive ductal carcinoma of breast, female, right ([place])    Osteopenia 11/26/2016   Skin-picking disorder    has 3 open wounds on right wrist that are being treated. About a quarter in size.   Stroke [facility])     Social History:  Social History   Socioeconomic History   Marital status: Divorced    Spouse name: Not on file   Number of children: Not on file   Years of education: Not on file   Highest education  level: Not on file  Occupational History   Occupation: Nurse    Comment: Retired  Scientist, product/process development strain: Not hard at [employer] insecurity    Worry: Never true    Inability: Never true   Transportation needs    Medical: No    Non-medical: No  Tobacco Use   Smoking status: Former Smoker    Packs/day: 0.50    Years: 40.00    Pack years: 20.00    Types: Cigarettes    Quit date: 09/04/1995    Years since quitting: 23.6   Smokeless tobacco: Never Used  Substance and Sexual Activity   Alcohol use: No   Drug use: No    Types:  Benzodiazepines, Hydrocodone   Sexual activity: Never  Lifestyle   Physical activity    Days per week: 0 days    Minutes per session: 0 min   Stress: Only a little  Relationships   Social connections    Talks on phone: Once a week    Gets together: Never    Attends religious service: More than 4 times per year    Active member of club or organization: No    Attends meetings of clubs or organizations: Never    Relationship status: Divorced   Intimate partner violence    Fear of current or ex partner: No    Emotionally abused: No    Physically abused: No    Forced sexual activity: No  Other Topics Concern   Not on file  Social History Narrative   Divorced.  Former smoker, quit in 1997.  Nondrinker.     Medications:   Current Outpatient Medications on File Prior to Visit  Medication Sig Dispense Refill   acetaminophen (TYLENOL) 500 MG tablet Take 1,000 mg by mouth as needed.     ALPRAZolam (XANAX) 0.5 MG tablet TAKE 1 TABLET(0.5 MG) BY MOUTH DAILY AS NEEDED FOR ANXIETY 30 tablet 2   atorvastatin (LIPITOR) 40 MG tablet Take 1 tablet (40 mg total) by mouth daily at 6 PM. 30 tablet 0   calcium carbonate (OSCAL) 1500 (600 Ca) MG TABS tablet Take 600 mg of elemental calcium by mouth daily.     cholecalciferol (VITAMIN D) 1000 units tablet Take 1 tablet (1,000 Units total) by mouth daily. 30 tablet 11   clopidogrel (PLAVIX) 75 MG tablet Take 1 tablet (75 mg total) by mouth daily. 30 tablet 0   escitalopram (LEXAPRO) 20 MG tablet Take 1 tablet (20 mg total) by mouth daily. 30 tablet 2   gabapentin (NEURONTIN) 300 MG capsule Take 1 capsule (300 mg total) by mouth at bedtime. 30 capsule 2   Glycerin-Hypromellose-PEG 400 (HM DRY EYE RELIEF) 0.2-0.2-1 % SOLN Apply 1 drop to eye daily as needed (dry eyes).     letrozole (FEMARA) 2.5 MG tablet TAKE 1 TABLET(2.5 MG) BY MOUTH DAILY 30 tablet 0   metFORMIN (GLUCOPHAGE) 500 MG tablet Take 1 tablet (500 mg total) by mouth 2 (two)  times daily with a meal. 60 tablet 0   NON FORMULARY Diet: Regular, NAS, Consistent Carbohydrate     ONE TOUCH ULTRA TEST test strip USE TO TEST BLOOD SUGAR ONCE DAILY     risperiDONE (RISPERDAL) 1 MG tablet Take 1 tablet (1 mg total) by mouth at bedtime. 30 tablet 2   traZODone (DESYREL) 50 MG tablet TAKE 1 TABLET(50 MG) BY MOUTH AT BEDTIME 30 tablet 2   venlafaxine XR (EFFEXOR-XR) 150 MG 24 hr capsule  TAKE 1 CAPSULE(150 MG) BY MOUTH TWICE DAILY 60 capsule 2   oxyCODONE-acetaminophen (PERCOCET/ROXICET) 5-325 MG tablet TK 1 T PO Q 6 H PRN     No current facility-administered medications on file prior to visit.     Allergies:   Allergies  Allergen Reactions   Cymbalta [Duloxetine Hcl] Other (See Comments)    Hallucinated and couldn't think; got worse and worse the longer she took this.    Physical Exam General: Frail elderly Caucasian lady seated, in no evident distress Head: head normocephalic and atraumatic.  Neck: supple with no carotid or supraclavicular bruits Cardiovascular: regular rate and rhythm, no murmurs Musculoskeletal: Mild kyphoscoliosis Skin:  no rash/petichiae Vascular:  Normal pulses all extremities Vitals:   05/06/19 1357  BP: 129/62  Pulse: 69  Temp: 97.6 F (36.4 C)   Neurologic Exam Mental Status: Awake and fully alert. Oriented to place and time. Recent and remote memory intact. Attention span, concentration and fund of knowledge appropriate. Mood and affect appropriate.  Cranial Nerves: Fundoscopic exam reveals sharp disc margins. Pupils equal, briskly reactive to light. Extraocular movements full without nystagmus. Visual fields full to confrontation. Hearing diminished bilaterally facial sensation intact. Face, tongue, palate moves normally and symmetrically.  Motor: Normal bulk and tone. Normal strength in all tested extremity muscles except mild weakness of right grip and intrinsic hand muscles.  Orbits left over right upper extremity.  Mild  weakness of right hip flexors and ankle dorsiflexors but strength testing is limited due to knee pain. Sensory.: intact to touch ,pinprick .position and vibratory sensation.  Coordination: Rapid alternating movements normal in all extremities. Finger-to-nose and heel-to-shin performed accurately bilaterally. Gait and Station: Arises from chair with slight difficulty. Stance is stooped. Gait demonstrates slight dragging and favoring of the right leg due to knee pain.  Unable to walk tandem. Reflexes: 1+ and symmetric. Toes downgoing.   NIHSS  0 Modified Rankin  2   ASSESSMENT: 83 year old Caucasian lady with embolic left MCA branch infarct in August 2020 of cryptogenic etiology.  Vascular risk factors of diabetes, hypertension, hyperlipidemia.     PLAN: I had a long d/w patient and her sitter about her recent cryptogenic stroke, risk for recurrent stroke/TIAs, personally independently reviewed imaging studies and stroke evaluation results and answered questions.Continue aspirin 81 mg daily but stop plavix as she is bruising easily and it has been greater than 3 weeks for secondary stroke prevention and maintain strict control of hypertension with blood pressure goal below 130/90, diabetes with hemoglobin A1c goal below 6.5% and lipids with LDL cholesterol goal below 70 mg/dL. I also advised the patient to eat a healthy diet with plenty of whole grains, cereals, fruits and vegetables, exercise regularly and maintain ideal body weight . Check follow up lipid profile today. She was encouraged to use a walker for ambulation and safety purposes.. She was advised to keep upcoming appointment with the cardiologist to have 30-day heart monitor for paroxysmal A. fib greater than 50% time during this 45-minute consultation visit was spent on counseling and coordination of care about her cryptogenic stroke discussion about prevention and treatment and answering questions.  Followup in the future with my nurse  practitioner Janett Billow in 3 months or call earlier if necessary.  Antony Contras, MD  [facility] [address] [place], [place] [address]  Phone [phone] Fax [phone] Note: This document was prepared with digital dictation and possible smart phrase technology. Any transcriptional errors that result from this process are unintentional

## 2019-05-07 LAB — LIPID PANEL
Chol/HDL Ratio: 2.1 ratio (ref 0.0–4.4)
Cholesterol, Total: 172 mg/dL (ref 100–199)
HDL: 83 mg/dL (ref >39)
LDL Chol Calc (NIH): 57 mg/dL (ref 0–99)
Triglycerides: 203 mg/dL — ABNORMAL HIGH (ref 0–149)
VLDL Cholesterol Cal: 32 mg/dL (ref 5–40)

## 2019-05-10 ENCOUNTER — Telehealth (HOSPITAL_COMMUNITY): Payer: Self-pay | Admitting: *Deleted

## 2019-05-10 ENCOUNTER — Telehealth: Payer: Self-pay

## 2019-05-10 DIAGNOSIS — Z87891 Personal history of nicotine dependence: Secondary | ICD-10-CM | POA: Diagnosis not present

## 2019-05-10 DIAGNOSIS — I69351 Hemiplegia and hemiparesis following cerebral infarction affecting right dominant side: Secondary | ICD-10-CM | POA: Diagnosis not present

## 2019-05-10 DIAGNOSIS — E7800 Pure hypercholesterolemia, unspecified: Secondary | ICD-10-CM | POA: Diagnosis not present

## 2019-05-10 DIAGNOSIS — M1990 Unspecified osteoarthritis, unspecified site: Secondary | ICD-10-CM | POA: Diagnosis not present

## 2019-05-10 DIAGNOSIS — C50911 Malignant neoplasm of unspecified site of right female breast: Secondary | ICD-10-CM | POA: Diagnosis not present

## 2019-05-10 DIAGNOSIS — R69 Illness, unspecified: Secondary | ICD-10-CM | POA: Diagnosis not present

## 2019-05-10 DIAGNOSIS — E119 Type 2 diabetes mellitus without complications: Secondary | ICD-10-CM | POA: Diagnosis not present

## 2019-05-10 DIAGNOSIS — M8580 Other specified disorders of bone density and structure, unspecified site: Secondary | ICD-10-CM | POA: Diagnosis not present

## 2019-05-10 NOTE — Telephone Encounter (Signed)
-----   Message from Garvin Fila, MD sent at 05/07/2019  9:06 AM EDT ----- Mitchell Heir inform the patient that lab work shows that bad cholesterol is satisfactory.  Triglycerides slightly high and to see Dr. Maryjean Ka to discuss treatment for this.

## 2019-05-10 NOTE — Telephone Encounter (Signed)
I called pt that her bad cholesterol is satisfactory. Her triglycerides are slightly elevated and she needs to see Dr Kela Millin to discuss further treatment.I stated a copy was sent to his office. Pt verbalized understanding.

## 2019-05-10 NOTE — Telephone Encounter (Signed)
Triglycerides labs fax to Dr.Hawkins office for review.Fax and receive.

## 2019-05-10 NOTE — Telephone Encounter (Signed)
PATIENT'S SON CALLED CONCERNED THAT HIS MOM HAD A EXTREME ANXIETY ATTACK OVER THE WEEKEND. SHE HAS A HOME HEALTH AIDE DURNING THE WEEK BUT ON THE WEEKENDS HER ANXIETY LEVEL SEEMS TO ELEVATE BECAUSE SHE'S BY HERSELF. AND THAT'S THE FEAR OF BEGIN ALONE  & HER ANXIETY INCREASES.  FAMILY MEMEBER'S ARE ROTATING THRU THE DAY  & EVENING TO COME BY TO CHECK ON [name]. PATIENT MED'S ARE IN A  7-DAY PILL ORGANIZER BUT ON THE WEEKEND WHEN SHE'S ALONE  SHE HAS TOLD HER SON THAT SHE DIDN'T TAKE HER MED'S THAT SHE FLUSHED THEM. AND ALTHOUGH HER PILLS ARE IN A ORGANIZER THE BOTTLES HAVE BEEN LEFT IN THE HOME WHICH SHE HAS FOUND  & GOT INTO & HAS TAKING ALL HER XANAX FORM THE REFILL PICKED UP ON  9/9. TOLD HER SON SH DOESN'T WANT TO BE TOLD WHEN SHE SHE TAKE IT. SHE LIKES TAKEN IT WHEN SHE WANTS. HER SON IS CONCERNED THAT SHE ISN'T BE FORTHCOMING & HONEST WITH YOU. DID ENCOURAGE TO SPEAK WITH  PCP TO GET A ORDER FOR HOME HEALTH WHICH FAMILY IS PAYING G FOR FOR OUT OF POCKET, BUT SHE IS GETTING HOME HEALTH PHYSICAL THERAPY SINCE THE STROKE & I DID INFORM THAT A ADDITIONAL ORDER COULD BE ADD FOR HOME HEALTH FOR THE WEEKEND IF HE MENTIONS TO PCP. HE WAS PLEASED & FELT THAT HE RECEIVED HELPFUL INFORMATION. DID INFORM YOU WERE OUT OF OFFICE THIS WEEK. AND HE ASKED WHEN YOU RETURN TO CALL Amy [name]) Laurence Aly # [phone]

## 2019-05-11 ENCOUNTER — Ambulatory Visit: Payer: Medicare HMO | Admitting: Nurse Practitioner

## 2019-05-12 DIAGNOSIS — E7800 Pure hypercholesterolemia, unspecified: Secondary | ICD-10-CM | POA: Diagnosis not present

## 2019-05-12 DIAGNOSIS — M1990 Unspecified osteoarthritis, unspecified site: Secondary | ICD-10-CM | POA: Diagnosis not present

## 2019-05-12 DIAGNOSIS — C50911 Malignant neoplasm of unspecified site of right female breast: Secondary | ICD-10-CM | POA: Diagnosis not present

## 2019-05-12 DIAGNOSIS — R69 Illness, unspecified: Secondary | ICD-10-CM | POA: Diagnosis not present

## 2019-05-12 DIAGNOSIS — E119 Type 2 diabetes mellitus without complications: Secondary | ICD-10-CM | POA: Diagnosis not present

## 2019-05-12 DIAGNOSIS — I69351 Hemiplegia and hemiparesis following cerebral infarction affecting right dominant side: Secondary | ICD-10-CM | POA: Diagnosis not present

## 2019-05-12 DIAGNOSIS — Z87891 Personal history of nicotine dependence: Secondary | ICD-10-CM | POA: Diagnosis not present

## 2019-05-12 DIAGNOSIS — M8580 Other specified disorders of bone density and structure, unspecified site: Secondary | ICD-10-CM | POA: Diagnosis not present

## 2019-05-14 DIAGNOSIS — M1990 Unspecified osteoarthritis, unspecified site: Secondary | ICD-10-CM | POA: Diagnosis not present

## 2019-05-14 DIAGNOSIS — C50911 Malignant neoplasm of unspecified site of right female breast: Secondary | ICD-10-CM | POA: Diagnosis not present

## 2019-05-14 DIAGNOSIS — E119 Type 2 diabetes mellitus without complications: Secondary | ICD-10-CM | POA: Diagnosis not present

## 2019-05-14 DIAGNOSIS — R69 Illness, unspecified: Secondary | ICD-10-CM | POA: Diagnosis not present

## 2019-05-14 DIAGNOSIS — E7800 Pure hypercholesterolemia, unspecified: Secondary | ICD-10-CM | POA: Diagnosis not present

## 2019-05-14 DIAGNOSIS — I69351 Hemiplegia and hemiparesis following cerebral infarction affecting right dominant side: Secondary | ICD-10-CM | POA: Diagnosis not present

## 2019-05-14 DIAGNOSIS — M8580 Other specified disorders of bone density and structure, unspecified site: Secondary | ICD-10-CM | POA: Diagnosis not present

## 2019-05-14 DIAGNOSIS — Z87891 Personal history of nicotine dependence: Secondary | ICD-10-CM | POA: Diagnosis not present

## 2019-05-17 ENCOUNTER — Other Ambulatory Visit: Payer: Self-pay | Admitting: Orthopedic Surgery

## 2019-05-17 ENCOUNTER — Other Ambulatory Visit: Payer: Self-pay | Admitting: Adult Health

## 2019-05-17 DIAGNOSIS — Z96651 Presence of right artificial knee joint: Secondary | ICD-10-CM

## 2019-05-17 DIAGNOSIS — M545 Low back pain, unspecified: Secondary | ICD-10-CM

## 2019-05-17 NOTE — Progress Notes (Signed)
PCP:  Sinda Du, MD Primary Cardiologist: No primary care provider on file. Electrophysiologist: None   Amy [name] is a 83 y.o. female with past medical history of chronic systolic CHF and cryptogenic stroke who presents today for routine electrophysiology followup. They are seen for Dr. [name]. Outpatient 30 day event monitory was ordered, but not completed. She has not had cards follow up since getting out of hospital. Pt states she is continuing to recover. She still has some R sided weakness, but has improved. She remains slightly SOB with exertion, but thinks it may be due to fatigue from stroke. She does feel like she has felt her heart racing in the past, but can't really put a timeline on this. She never heard from our office about 30 day monitor.  She denies symptoms of chest pain, orthopnea, PND, lower extremity edema, claudication, dizziness, presyncope, syncope, or bleeding. The patient is tolerating medications without difficulties.    Past Medical History:  Diagnosis Date  . Anxiety   . Arthritis   . Back pain   . Colon polyps   . Depression   . Diabetes mellitus, type II ([place])    diet controlled  . Hypercholesterolemia   . Invasive ductal carcinoma of breast, female, right ([place])   . Osteopenia 11/26/2016  . Skin-picking disorder    has 3 open wounds on right wrist that are being treated. About a quarter in size.  . Stroke [facility])    Past Surgical History:  Procedure Laterality Date  . APPENDECTOMY    . BACK SURGERY    . BREAST BIOPSY Right 10/09/2016   Procedure: BREAST BIOPSY WITH NEEDLE LOCALIZATION;  Surgeon: [name], MD;  Location: [facility] ORS;  Service: General;  Laterality: Right;  . BREAST SURGERY Right   . BUBBLE STUDY  03/23/2019   Procedure: BUBBLE STUDY;  Surgeon: [name], MD;  Location: [facility] ENDOSCOPY;  Service: Cardiovascular;;  . BUNIONECTOMY Bilateral   . FACIAL COSMETIC SURGERY     drooping eyelid and brow  . HERNIA REPAIR Right     Inguinal x2  . KNEE ARTHROSCOPY Right   . SHOULDER SURGERY Right    rotator cuff repair and removal of bone spur  . TEE WITHOUT CARDIOVERSION N/A 03/23/2019   Procedure: TRANSESOPHAGEAL ECHOCARDIOGRAM (TEE);  Surgeon: [name], MD;  Location: [facility] ENDOSCOPY;  Service: Cardiovascular;  Laterality: N/A;  . TEMPOROMANDIBULAR JOINT SURGERY Right   . TOTAL KNEE ARTHROPLASTY Right 05/05/2018   Procedure: TOTAL KNEE ARTHROPLASTY;  Surgeon: [name], MD;  Location: [facility] ORS;  Service: Orthopedics;  Laterality: Right;    Current Outpatient Medications  Medication Sig Dispense Refill  . acetaminophen (TYLENOL) 500 MG tablet Take 1,000 mg by mouth as needed.    . ALPRAZolam (XANAX) 0.5 MG tablet TAKE 1 TABLET(0.5 MG) BY MOUTH DAILY AS NEEDED FOR ANXIETY 30 tablet 2  . atorvastatin (LIPITOR) 40 MG tablet Take 1 tablet (40 mg total) by mouth daily at 6 PM. 30 tablet 0  . calcium carbonate (OSCAL) 1500 (600 Ca) MG TABS tablet Take 600 mg of elemental calcium by mouth daily.    . cholecalciferol (VITAMIN D) 1000 units tablet Take 1 tablet (1,000 Units total) by mouth daily. 30 tablet 11  . escitalopram (LEXAPRO) 20 MG tablet Take 1 tablet (20 mg total) by mouth daily. 30 tablet 2  . gabapentin (NEURONTIN) 300 MG capsule Take 1 capsule (300 mg total) by mouth at bedtime. 30 capsule 2  . Glycerin-Hypromellose-PEG  400 (HM DRY EYE RELIEF) 0.2-0.2-1 % SOLN Apply 1 drop to eye daily as needed (dry eyes).    Marland Kitchen ibuprofen (ADVIL) 800 MG tablet TAKE 1 TABLET(800 MG) BY MOUTH EVERY 8 HOURS AS NEEDED 90 tablet 1  . letrozole (FEMARA) 2.5 MG tablet TAKE 1 TABLET(2.5 MG) BY MOUTH DAILY 30 tablet 0  . metFORMIN (GLUCOPHAGE) 500 MG tablet Take 1 tablet (500 mg total) by mouth 2 (two) times daily with a meal. 60 tablet 0  . NON FORMULARY Diet: Regular, NAS, Consistent Carbohydrate    . ONE TOUCH ULTRA TEST test strip USE TO TEST BLOOD SUGAR ONCE DAILY    . risperiDONE (RISPERDAL) 1 MG tablet Take 1  tablet (1 mg total) by mouth at bedtime. 30 tablet 2  . traZODone (DESYREL) 50 MG tablet TAKE 1 TABLET(50 MG) BY MOUTH AT BEDTIME 30 tablet 2  . venlafaxine XR (EFFEXOR-XR) 150 MG 24 hr capsule TAKE 1 CAPSULE(150 MG) BY MOUTH TWICE DAILY 60 capsule 2   No current facility-administered medications for this visit.     Allergies  Allergen Reactions  . Cymbalta [Duloxetine Hcl] Other (See Comments)    Hallucinated and couldn't think; got worse and worse the longer she took this.    Social History   Socioeconomic History  . Marital status: Divorced    Spouse name: Not on file  . Number of children: Not on file  . Years of education: Not on file  . Highest education level: Not on file  Occupational History  . 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 Financial resource strain: Not hard at all  . Food insecurity    Worry: Never true    Inability: Never true  . Transportation needs    Medical: No    Non-medical: No  Tobacco Use  . Smoking status: Former Smoker    Packs/day: 0.50    Years: 40.00    Pack years: 20.00    Types: Cigarettes    Quit date: 09/04/1995    Years since quitting: 23.7  . Smokeless tobacco: Never Used  Substance and Sexual Activity  . Alcohol use: No  . Drug use: No    Types: Benzodiazepines, Hydrocodone  . Sexual activity: Never  Lifestyle  . Physical activity    Days per week: 0 days    Minutes per session: 0 min  . Stress: Only a little  Relationships  . Social Herbalist on phone: Once a week    Gets together: Never    Attends religious service: More than 4 times per year    Active member of club or organization: No    Attends meetings of clubs or organizations: Never    Relationship status: Divorced  . Intimate partner violence    Fear of current or ex partner: No    Emotionally abused: No    Physically abused: No    Forced sexual activity: No  Other Topics Concern  . Not on file  Social History Narrative    Divorced.  Former smoker, quit in 1997.  Nondrinker.      Review of Systems: General: No chills, fever, night sweats or weight changes  Cardiovascular:  No chest pain, dyspnea on exertion, edema, orthopnea, palpitations, paroxysmal nocturnal dyspnea Dermatological: No rash, lesions or masses Respiratory: No cough, dyspnea Urologic: No hematuria, dysuria Abdominal: No nausea, vomiting, diarrhea, bright red blood per rectum, melena, or hematemesis Neurologic: No visual changes, weakness, changes in mental  status All other systems reviewed and are otherwise negative except as noted above.  Physical Exam: Vitals:   05/19/19 1013  BP: 138/82  Pulse: 92  SpO2: 97%  Weight: 175 lb (79.4 kg)  Height: 5\' 4"  (1.626 m)    GEN- The patient is well appearing, alert and oriented x 3 today.   HEENT: normocephalic, atraumatic; sclera clear, conjunctiva pink; hearing intact; oropharynx clear; neck supple, no JVP Lymph- no cervical lymphadenopathy Lungs- Clear to ausculation bilaterally, normal work of breathing.  No wheezes, rales, rhonchi Heart- Regular rate and rhythm, no murmurs, rubs or gallops, PMI not laterally displaced GI- soft, non-tender, non-distended, bowel sounds present, no hepatosplenomegaly Extremities- no clubbing, cyanosis, or edema; DP/PT/radial pulses 2+ bilaterally MS- no significant deformity or atrophy Skin- warm and dry, no rash or lesion Psych- euthymic mood, full affect Neuro- strength and sensation are intact  EKG is not ordered. Personal review of EKG from today shows NSR 88 bpms with PVCs  Assessment and Plan:  1. Cryptogenic stroke/Palpitations She was arranged for 30 day monitor, but this has not yet been done, she never heard back from our office.  She also has a non-specific history of palpitations "heart racing" will re-order 30 day monitor today.   2. Chronic systolic CHF TEE [id number] with LVEF 30-35% NYHA II-III symptoms, but confounded by stroke  Volume status stable on exam Not currently on medication Start Losartan 12.5 mg qhs Start coreg 3.125 mg BID.  Will need repeat echo after 3-4 months of titration of GDMT.   Recommend follow up with Gen cards in 4 weeks to continue titration of GDMT. Started ARB and BB today.  Will plan on seeing her back in 3 months to further discuss 30 day monitor and CHF. Loop recorder  Would likely be deferred until repeat echo, in the event she qualifies for a defibrillator.   Shirley Friar, PA-C  05/19/19 10:23 AM

## 2019-05-17 NOTE — Telephone Encounter (Signed)
Left message

## 2019-05-19 ENCOUNTER — Other Ambulatory Visit: Payer: Self-pay | Admitting: Adult Health

## 2019-05-19 ENCOUNTER — Other Ambulatory Visit: Payer: Self-pay

## 2019-05-19 ENCOUNTER — Ambulatory Visit: Payer: Medicare HMO | Admitting: Student

## 2019-05-19 VITALS — BP 138/82 | HR 92 | Ht 64.0 in | Wt 175.0 lb

## 2019-05-19 DIAGNOSIS — R002 Palpitations: Secondary | ICD-10-CM | POA: Diagnosis not present

## 2019-05-19 DIAGNOSIS — I5022 Chronic systolic (congestive) heart failure: Secondary | ICD-10-CM

## 2019-05-19 DIAGNOSIS — I639 Cerebral infarction, unspecified: Secondary | ICD-10-CM | POA: Diagnosis not present

## 2019-05-19 MED ORDER — LOSARTAN POTASSIUM 25 MG PO TABS: 12.5000 mg | ORAL_TABLET | Freq: Every day | ORAL | 3 refills | Status: DC

## 2019-05-19 MED ORDER — CARVEDILOL 3.125 MG PO TABS: 3.1250 mg | ORAL_TABLET | Freq: Two times a day (BID) | ORAL | 3 refills | Status: DC

## 2019-05-19 NOTE — Patient Instructions (Addendum)
Medication Instructions:   START TAKING:   CARVEDILOL 3.125 MG  TWICE  A DAY      LOSARTAN 12.5 MG ONCE A DAY    *If you need a refill on your cardiac medications before your next appointment, please call your pharmacy*  Lab Work:  NONE ORDERED  TODAY   If you have labs (blood work) drawn today and your tests are completely normal, you will receive your results only by: Marland Kitchen MyChart Message (if you have MyChart) OR . A paper copy in the mail If you have any lab test that is abnormal or we need to change your treatment, we will call you to review the results.  Testing/Procedures: Your physician has recommended that you wear an event monitor. Event monitors are medical devices that record the heart's electrical activity. Doctors most often [place] these monitors to diagnose arrhythmias. Arrhythmias are problems with the speed or rhythm of the heartbeat. The monitor is a small, portable device. You can wear one while you do your normal daily activities. This is usually used to diagnose what is causing palpitations/syncope (passing out).   Follow-Up: At [facility], you and your health needs are our priority.  As part of our continuing mission to provide you with exceptional heart care, we have created designated Provider Care Teams.  These Care Teams include your primary Cardiologist (physician) and Advanced Practice Providers (APPs -  Physician Assistants and Nurse Practitioners) who all work together to provide you with the care you need, when you need it.  Your next appointment:   4 WEEKS APP GENERAL CARDIOLOGY   3 MONTH TILLERY PA-C  The format for your next appointment:   IN PERSON  Provider:     Other Instructions

## 2019-05-20 ENCOUNTER — Emergency Department (HOSPITAL_COMMUNITY): Payer: Medicare HMO

## 2019-05-20 ENCOUNTER — Emergency Department (HOSPITAL_COMMUNITY)
Admission: EM | Admit: 2019-05-20 | Discharge: 2019-05-21 | Disposition: A | Discharge status: Home / Self Care | Payer: Medicare HMO | Attending: Emergency Medicine | Admitting: Emergency Medicine

## 2019-05-20 ENCOUNTER — Encounter (HOSPITAL_COMMUNITY): Payer: Self-pay

## 2019-05-20 ENCOUNTER — Other Ambulatory Visit: Payer: Self-pay

## 2019-05-20 DIAGNOSIS — R1013 Epigastric pain: Secondary | ICD-10-CM | POA: Diagnosis not present

## 2019-05-20 DIAGNOSIS — Z7982 Long term (current) use of aspirin: Secondary | ICD-10-CM | POA: Diagnosis not present

## 2019-05-20 DIAGNOSIS — E7800 Pure hypercholesterolemia, unspecified: Secondary | ICD-10-CM | POA: Diagnosis not present

## 2019-05-20 DIAGNOSIS — Z87891 Personal history of nicotine dependence: Secondary | ICD-10-CM | POA: Diagnosis not present

## 2019-05-20 DIAGNOSIS — K5730 Diverticulosis of large intestine without perforation or abscess without bleeding: Secondary | ICD-10-CM | POA: Diagnosis not present

## 2019-05-20 DIAGNOSIS — R591 Generalized enlarged lymph nodes: Secondary | ICD-10-CM | POA: Diagnosis not present

## 2019-05-20 DIAGNOSIS — Z79899 Other long term (current) drug therapy: Secondary | ICD-10-CM | POA: Insufficient documentation

## 2019-05-20 DIAGNOSIS — Z7984 Long term (current) use of oral hypoglycemic drugs: Secondary | ICD-10-CM | POA: Insufficient documentation

## 2019-05-20 DIAGNOSIS — N3000 Acute cystitis without hematuria: Secondary | ICD-10-CM | POA: Diagnosis not present

## 2019-05-20 DIAGNOSIS — I69351 Hemiplegia and hemiparesis following cerebral infarction affecting right dominant side: Secondary | ICD-10-CM | POA: Diagnosis not present

## 2019-05-20 DIAGNOSIS — M8580 Other specified disorders of bone density and structure, unspecified site: Secondary | ICD-10-CM | POA: Diagnosis not present

## 2019-05-20 DIAGNOSIS — R69 Illness, unspecified: Secondary | ICD-10-CM | POA: Diagnosis not present

## 2019-05-20 DIAGNOSIS — E119 Type 2 diabetes mellitus without complications: Secondary | ICD-10-CM | POA: Insufficient documentation

## 2019-05-20 DIAGNOSIS — C50911 Malignant neoplasm of unspecified site of right female breast: Secondary | ICD-10-CM | POA: Diagnosis not present

## 2019-05-20 DIAGNOSIS — R1084 Generalized abdominal pain: Secondary | ICD-10-CM | POA: Diagnosis not present

## 2019-05-20 DIAGNOSIS — R599 Enlarged lymph nodes, unspecified: Secondary | ICD-10-CM

## 2019-05-20 DIAGNOSIS — I4581 Long QT syndrome: Secondary | ICD-10-CM | POA: Diagnosis not present

## 2019-05-20 DIAGNOSIS — M1990 Unspecified osteoarthritis, unspecified site: Secondary | ICD-10-CM | POA: Diagnosis not present

## 2019-05-20 LAB — URINALYSIS, ROUTINE W REFLEX MICROSCOPIC
Bacteria, UA: NONE SEEN
Bilirubin Urine: NEGATIVE
Glucose, UA: NEGATIVE mg/dL
Hgb urine dipstick: NEGATIVE
Ketones, ur: 5 mg/dL — AB
Nitrite: POSITIVE — AB
Protein, ur: NEGATIVE mg/dL
Specific Gravity, Urine: 1.035 — ABNORMAL HIGH (ref 1.005–1.030)
pH: 7 (ref 5.0–8.0)

## 2019-05-20 LAB — BRAIN NATRIURETIC PEPTIDE: B Natriuretic Peptide: 47 pg/mL (ref 0.0–100.0)

## 2019-05-20 LAB — CBC
HCT: 40.2 % (ref 36.0–46.0)
Hemoglobin: 13.1 g/dL (ref 12.0–15.0)
MCH: 30 pg (ref 26.0–34.0)
MCHC: 32.6 g/dL (ref 30.0–36.0)
MCV: 92 fL (ref 80.0–100.0)
Platelets: 221 10*3/uL (ref 150–400)
RBC: 4.37 MIL/uL (ref 3.87–5.11)
RDW: 12.4 % (ref 11.5–15.5)
WBC: 8 10*3/uL (ref 4.0–10.5)
nRBC: 0 % (ref 0.0–0.2)

## 2019-05-20 LAB — LIPASE, BLOOD: Lipase: 26 U/L (ref 11–51)

## 2019-05-20 LAB — COMPREHENSIVE METABOLIC PANEL
ALT: 12 U/L (ref 0–44)
AST: 17 U/L (ref 15–41)
Albumin: 3.9 g/dL (ref 3.5–5.0)
Alkaline Phosphatase: 68 U/L (ref 38–126)
Anion gap: 9 (ref 5–15)
BUN: 16 mg/dL (ref 8–23)
CO2: 26 mmol/L (ref 22–32)
Calcium: 8.9 mg/dL (ref 8.9–10.3)
Chloride: 103 mmol/L (ref 98–111)
Creatinine, Ser: 0.75 mg/dL (ref 0.44–1.00)
GFR calc Af Amer: >60 mL/min (ref >60)
GFR calc non Af Amer: >60 mL/min (ref >60)
Glucose, Bld: 100 mg/dL — ABNORMAL HIGH (ref 70–99)
Potassium: 3.4 mmol/L — ABNORMAL LOW (ref 3.5–5.1)
Sodium: 138 mmol/L (ref 135–145)
Total Bilirubin: 0.4 mg/dL (ref 0.3–1.2)
Total Protein: 6.4 g/dL — ABNORMAL LOW (ref 6.5–8.1)

## 2019-05-20 MED ORDER — CEPHALEXIN 500 MG PO CAPS
500.0000 mg | ORAL_CAPSULE | Freq: Once | ORAL | Status: AC
  Administered 2019-05-21: 500 mg via ORAL
  Filled 2019-05-20: qty 1

## 2019-05-20 MED ORDER — IOHEXOL 300 MG/ML  SOLN
100.0000 mL | Freq: Once | INTRAMUSCULAR | Status: AC | PRN
  Administered 2019-05-20: 100 mL via INTRAVENOUS

## 2019-05-20 MED ORDER — CEPHALEXIN 500 MG PO CAPS: 500.0000 mg | ORAL_CAPSULE | Freq: Four times a day (QID) | ORAL | 0 refills | Status: DC

## 2019-05-20 MED ORDER — SODIUM CHLORIDE 0.9 % IV SOLN
INTRAVENOUS | Status: DC
  Administered 2019-05-20: 1000 mL via INTRAVENOUS

## 2019-05-20 MED ORDER — SODIUM CHLORIDE 0.9 % IV SOLN: INTRAVENOUS | Status: DC

## 2019-05-20 NOTE — ED Provider Notes (Signed)
[facility] EMERGENCY DEPARTMENT Provider Note   CSN: [id number] Arrival date & time: 05/20/19  1618     History   Chief Complaint Chief Complaint  Patient presents with   Abdominal Pain    HPI Amy [name] is a 83 y.o. female.     Patient followed by Dr. Luan Pulling.  Patient with complaint of abdominal pain triage that epigastric pain but patient state kind of all over feels as if her belly is full.  Home health nurse came to see her today and they contacted Dr. Luan Pulling and recommend she come in for evaluation.  The pain started this morning.  Patient's past medical history significant for invasive ductal carcinoma of the breast on the right.  Patient also has diabetes but is diet controlled.  Patient is followed by the hematology oncology clinic here at [facility] last seen by them in May 2020 originally diagnosed it seems to be in 2018.  Not known to have any active disease.     Past Medical History:  Diagnosis Date   Anxiety    Arthritis    Back pain    Colon polyps    Depression    Diabetes mellitus, type II ([place])    diet controlled   Hypercholesterolemia    Invasive ductal carcinoma of breast, female, right ([place])    Osteopenia 11/26/2016   Skin-picking disorder    has 3 open wounds on right wrist that are being treated. About a quarter in size.   Stroke [facility]     Patient Active Problem List   Diagnosis Date Noted   Cardiac LV ejection fraction 30-35% 03/29/2019   Dyslipidemia associated with type 2 diabetes mellitus ([place]) 03/29/2019   Major depression with psychotic features ([place]) 03/29/2019   Acute ischemic left MCA stroke ([place]) 03/20/2019   Edema of both lower legs due to peripheral venous insufficiency 10/12/2018   Anemia 05/12/2018   S/P total knee replacement, right 05/05/18 05/12/2018   Type 2 diabetes mellitus with neurological complications ([place]) [id number]   Primary osteoarthritis of right knee    Status post  surgery 01/26/2018   Surgery, elective 01/26/2018   Degenerative spondylolisthesis 01/26/2018   Osteoporosis 12/13/2016   Osteopenia 11/26/2016   Invasive ductal carcinoma of breast, female, right ([place])    Lumbago 03/22/2014   Stiffness of joint, not elsewhere classified, pelvic region and thigh 03/22/2014   Muscle weakness (generalized) 03/22/2014   Abnormality of gait 03/22/2014   Depression with anxiety 09/03/2012   Insomnia secondary to depression with anxiety 09/03/2012   Pain 09/03/2012    Past Surgical History:  Procedure Laterality Date   APPENDECTOMY     BACK SURGERY     BREAST BIOPSY Right 10/09/2016   Procedure: BREAST BIOPSY WITH NEEDLE LOCALIZATION;  Surgeon: [name], MD;  Location: [facility];  Service: General;  Laterality: Right;   BREAST SURGERY Right    BUBBLE STUDY  03/23/2019   Procedure: BUBBLE STUDY;  Surgeon: [name], MD;  Location: [facility];  Service: Cardiovascular;;   BUNIONECTOMY Bilateral    FACIAL COSMETIC SURGERY     drooping eyelid and brow   HERNIA REPAIR Right    Inguinal x2   KNEE ARTHROSCOPY Right    SHOULDER SURGERY Right    rotator cuff repair and removal of bone spur   TEE WITHOUT CARDIOVERSION N/A 03/23/2019   Procedure: TRANSESOPHAGEAL ECHOCARDIOGRAM (TEE);  Surgeon: [name], MD;  Location: [place];  Service: Cardiovascular;  Laterality: N/A;  TEMPOROMANDIBULAR JOINT SURGERY Right    TOTAL KNEE ARTHROPLASTY Right 05/05/2018   Procedure: TOTAL KNEE ARTHROPLASTY;  Surgeon: [name], MD;  Location: [facility];  Service: Orthopedics;  Laterality: Right;     OB History   No obstetric history on file.      Home Medications    Prior to Admission medications   Medication Sig Start Date End Date Taking? Authorizing Provider  ALPRAZolam (XANAX) 0.5 MG tablet TAKE 1 TABLET(0.5 MG) BY MOUTH DAILY AS NEEDED FOR ANXIETY Patient taking differently: Take 0.5 mg by mouth daily as  needed for anxiety.  04/22/19  Yes Cloria Spring, MD  aspirin EC 81 MG tablet Take 81 mg by mouth every morning.   Yes [provider]  atorvastatin (LIPITOR) 40 MG tablet Take 1 tablet (40 mg total) by mouth daily at 6 PM. 04/09/19 05/20/19 Yes Green, Phylis Bougie, NP  calcium carbonate (OSCAL) 1500 (600 Ca) MG TABS tablet Take 600 mg of elemental calcium by mouth daily with supper.    Yes [provider]  carvedilol (COREG) 3.125 MG tablet Take 1 tablet (3.125 mg total) by mouth 2 (two) times daily. 05/19/19 08/17/19 Yes TillerySatira Mccallum, PA-C  cholecalciferol (VITAMIN D) 1000 units tablet Take 1 tablet (1,000 Units total) by mouth daily. 10/23/16  Yes Kefalas, Manon Hilding, PA-C  escitalopram (LEXAPRO) 20 MG tablet Take 1 tablet (20 mg total) by mouth daily. 04/22/19  Yes Cloria Spring, MD  gabapentin (NEURONTIN) 300 MG capsule Take 1 capsule (300 mg total) by mouth at bedtime. 04/22/19  Yes Cloria Spring, MD  ibuprofen (ADVIL) 800 MG tablet TAKE 1 TABLET(800 MG) BY MOUTH EVERY 8 HOURS AS NEEDED Patient taking differently: Take 800 mg by mouth every 8 (eight) hours as needed for mild pain or moderate pain.  05/17/19  Yes [name], MD  letrozole [facility]) 2.5 MG tablet TAKE 1 TABLET(2.5 MG) BY MOUTH DAILY Patient taking differently: Take 2.5 mg by mouth every morning.  04/09/19  Yes Gerlene Fee, NP  losartan (COZAAR) 25 MG tablet Take 0.5 tablets (12.5 mg total) by mouth at bedtime. 05/19/19 08/17/19 Yes TillerySatira Mccallum, PA-C  metFORMIN (GLUCOPHAGE) 500 MG tablet Take 1 tablet (500 mg total) by mouth 2 (two) times daily with a meal. 04/09/19  Yes Gerlene Fee, NP  risperiDONE (RISPERDAL) 1 MG tablet Take 1 tablet (1 mg total) by mouth at bedtime. 04/22/19  Yes Cloria Spring, MD  traZODone (DESYREL) 50 MG tablet TAKE 1 TABLET(50 MG) BY MOUTH AT BEDTIME 04/22/19  Yes Cloria Spring, MD  venlafaxine XR (EFFEXOR-XR) 150 MG 24 hr capsule TAKE 1 CAPSULE(150 MG)  BY MOUTH TWICE DAILY Patient taking differently: Take 150 mg by mouth 2 (two) times daily.  04/22/19  Yes Cloria Spring, MD  cephALEXin (KEFLEX) 500 MG capsule Take 1 capsule (500 mg total) by mouth 4 (four) times daily. 05/20/19   Fredia Sorrow, MD    Family History Family History  Problem Relation Age of Onset   Anxiety disorder Mother    Depression Mother    Depression Maternal Grandmother    Alcohol abuse Brother    Cancer - Other Sister    ADD / ADHD Neg Hx    Bipolar disorder Neg Hx    Dementia Neg Hx    Drug abuse Neg Hx    OCD Neg Hx    Paranoid behavior Neg Hx    Schizophrenia Neg Hx  Seizures Neg Hx    Sexual abuse Neg Hx    Physical abuse Neg Hx     Social History Social History   Tobacco Use   Smoking status: Former Smoker    Packs/day: 0.50    Years: 40.00    Pack years: 20.00    Types: Cigarettes    Quit date: 09/04/1995    Years since quitting: 23.7   Smokeless tobacco: Never Used  Substance Use Topics   Alcohol use: No   Drug use: No    Types: Benzodiazepines, Hydrocodone     Allergies   Cymbalta [duloxetine hcl]   Review of Systems Review of Systems  Constitutional: Negative for chills and fever.  HENT: Negative for congestion, rhinorrhea and sore throat.   Eyes: Negative for visual disturbance.  Respiratory: Negative for cough and shortness of breath.   Cardiovascular: Negative for chest pain and leg swelling.  Gastrointestinal: Positive for abdominal distention and abdominal pain. Negative for diarrhea, nausea and vomiting.  Genitourinary: Negative for dysuria and hematuria.  Musculoskeletal: Negative for back pain and neck pain.  Skin: Negative for rash.  Neurological: Negative for dizziness, light-headedness and headaches.  Hematological: Does not bruise/bleed easily.  Psychiatric/Behavioral: Negative for confusion.     Physical Exam Updated Vital Signs BP (!) 172/101    Pulse 80    Temp 98 F (36.7 C)  (Oral)    Ht 1.626 m (5\' 4" )    Wt 79.4 kg    SpO2 98%    BMI 30.05 kg/m   Physical Exam Vitals signs and nursing note reviewed.  Constitutional:      General: She is not in acute distress.    Appearance: She is well-developed.  HENT:     Head: Normocephalic and atraumatic.  Eyes:     Extraocular Movements: Extraocular movements intact.     Conjunctiva/sclera: Conjunctivae normal.     Pupils: Pupils are equal, round, and reactive to light.  Neck:     Musculoskeletal: Neck supple.  Cardiovascular:     Rate and Rhythm: Normal rate and regular rhythm.     Heart sounds: No murmur.  Pulmonary:     Effort: Pulmonary effort is normal. No respiratory distress.     Breath sounds: Normal breath sounds.  Abdominal:     Palpations: Abdomen is soft. There is no mass.     Tenderness: There is no abdominal tenderness.     Comments: Perhaps some slight distention.  But no tenderness.  Skin:    General: Skin is warm and dry.  Neurological:     General: No focal deficit present.     Mental Status: She is alert and oriented to person, place, and time.      ED Treatments / Results  Labs (all labs ordered are listed, but only abnormal results are displayed) Labs Reviewed  COMPREHENSIVE METABOLIC PANEL - Abnormal; Notable for the following components:      Result Value   Potassium 3.4 (*)    Glucose, Bld 100 (*)    Total Protein 6.4 (*)    All other components within normal limits  URINALYSIS, ROUTINE W REFLEX MICROSCOPIC - Abnormal; Notable for the following components:   Specific Gravity, Urine 1.035 (*)    Ketones, ur 5 (*)    Nitrite POSITIVE (*)    Leukocytes,Ua SMALL (*)    All other components within normal limits  URINE CULTURE  LIPASE, BLOOD  CBC  BRAIN NATRIURETIC PEPTIDE    EKG EKG Interpretation  Date/Time:  Thursday May 20 2019 21:07:43 EDT Ventricular Rate:  80 PR Interval:    QRS Duration: 87 QT Interval:  421 QTC Calculation: 486 R Axis:   29 Text  Interpretation:  Sinus rhythm Atrial premature complexes Abnormal R-wave progression, early transition Borderline prolonged QT interval Confirmed by Fredia Sorrow [id number]) on 05/20/2019 9:13:33 PM   Radiology Ct Abdomen Pelvis W Contrast  Result Date: 05/20/2019 CLINICAL DATA:  Abdominal pain, question of diverticulitis, history of breast cancer EXAM: CT ABDOMEN AND PELVIS WITH CONTRAST TECHNIQUE: Multidetector CT imaging of the abdomen and pelvis was performed using the standard protocol following bolus administration of intravenous contrast. CONTRAST:  116mL OMNIPAQUE IOHEXOL 300 MG/ML  SOLN COMPARISON:  November 13, 2016 FINDINGS: Lower chest: The visualized heart size within normal limits. No pericardial fluid/thickening. No hiatal hernia.  There is a tortuous descending aorta. The visualized portions of the lungs are clear. Hepatobiliary: Again noted is a small low-density lesion adjacent to the falciform ligament, likely hepatic cyst.The main portal vein is patent. No evidence of calcified gallstones, gallbladder wall thickening or biliary dilatation. Pancreas: Unremarkable. No pancreatic ductal dilatation or surrounding inflammatory changes. Spleen: Normal in size without focal abnormality. Adrenals/Urinary Tract: Both adrenal glands appear normal. The kidneys and collecting system appear normal without evidence of urinary tract calculus or hydronephrosis. Bladder is unremarkable. Stomach/Bowel: The stomach and small bowel are unremarkable. There is scattered colonic diverticulosis without diverticulitis. Vascular/Lymphatic: Enlarged left inguinal adenopathy is seen measuring up to 2 cm in transverse dimension. There is also a left external iliac lymph node best seen on series 2, image 71 measuring 1.6 cm in length. Scattered aortic atherosclerosis is noted. Reproductive: The uterus and adnexa are unremarkable. Other: There is a small fat containing anterior umbilical hernia seen. Musculoskeletal: The  patient has had a prior thoracolumbar spine posterior fixation. There is interbody spacer seen at L4-L5 and L5-S1. There appears to be subsidence of the interbody spacer at L5-S1 with fracture of the superior vertebral body, as on prior exam of March 05, 2019. This is not significantly changed since the prior exam. There is also mild subsidence of the interbody spacer at L4-L5. IMPRESSION: 1. New left external iliac and inguinal region adenopathy as described above. Given the patient's history these findings are concerning for metastatic disease, less likely reactive adenopathy. Would recommend PET-CT for further evaluation if indicated. 2. Diverticulosis without diverticulitis. 3.  Aortic Atherosclerosis (ICD10-I70.0). Electronically Signed   By: Prudencio Pair M.D.   On: 05/20/2019 22:07   Dg Chest Port 1 View  Result Date: 05/20/2019 CLINICAL DATA:  Epigastric pain EXAM: PORTABLE CHEST 1 VIEW COMPARISON:  08/07/2016 FINDINGS: The heart size and mediastinal contours are within normal limits. No focal airspace consolidation, pleural effusion, or pneumothorax. Partially visualized thoracolumbar spinal fusion hardware. IMPRESSION: No acute cardiopulmonary findings. Electronically Signed   By: Davina Poke M.D.   On: 05/20/2019 21:26    Procedures Procedures (including critical care time)  Medications Ordered in ED Medications  0.9 %  sodium chloride infusion (1,000 mLs Intravenous New Bag/Given 05/20/19 2052)  cephALEXin (KEFLEX) capsule 500 mg (has no administration in time range)  iohexol (OMNIPAQUE) 300 MG/ML solution 100 mL (100 mLs Intravenous Contrast Given 05/20/19 2141)     Initial Impression / Assessment and Plan / ED Course  I have reviewed the triage vital signs and the nursing notes.  Pertinent labs & imaging results that were available during my care of the patient were reviewed by me and considered  in my medical decision making (see chart for details).    CT without any  significant findings abdomen pelvis other than left inguinal and external iliac adenopathy.  Which warrants closer follow-up.  Patient will follow up with Dr. Luan Pulling.  Dr. Luan Pulling may want to refer patient back to hematology oncology for the adenopathy findings.  Also urinalysis suggestive of urinary tract infection sent for culture.  Will be started on Keflex.  First dose given here tonight.  Informed both patient and her son about these findings and the importance of close follow-up and importance of taking the antibiotic.  Patient nontoxic no acute distress.  Can also follow-up with Dr. Luan Pulling for that.      Final Clinical Impressions(s) / ED Diagnoses   Final diagnoses:  Generalized abdominal pain  Adenopathy  Acute cystitis without hematuria    ED Discharge Orders         Ordered    cephALEXin (KEFLEX) 500 MG capsule  4 times daily     05/20/19 2309           Fredia Sorrow, MD 05/20/19 2321

## 2019-05-20 NOTE — Discharge Instructions (Signed)
CT scan without any acute findings other than showing some enlarged lymph nodes.  No distinct solid organ abnormalities.  But it is important additional work-up be done for these lymph nodes in the abdomen.  Give Dr. Luan Pulling office a call for follow-up tomorrow.  He should see you sometime in the next 2 weeks to address these findings.  Return for any new or worse symptoms.

## 2019-05-20 NOTE — ED Triage Notes (Signed)
Pt is having epigastric pain that started today. States it feels like "stiffness." Home health nurse came to see pt today. Dr. Luan Pulling sent patient here to be evaluated.

## 2019-05-23 LAB — URINE CULTURE: Culture: >=100000 — AB

## 2019-05-24 ENCOUNTER — Telehealth: Payer: Self-pay | Admitting: *Deleted

## 2019-05-24 NOTE — Telephone Encounter (Signed)
Post ED Visit - Positive Culture Follow-up  Culture report reviewed by antimicrobial stewardship pharmacist: [facility] Team []  Elenor Quinones, Pharm.D. []  Heide Guile, Pharm.D., BCPS AQ-ID []  Parks Neptune, Pharm.D., BCPS []  Alycia Rossetti, Pharm.D., BCPS []  [place], [place].D., BCPS, AAHIVP []  Legrand Como, Pharm.D., BCPS, AAHIVP [x]  Salome Arnt, PharmD, BCPS []  Johnnette Gourd, PharmD, BCPS []  Hughes Better, PharmD, BCPS []  Leeroy Cha, PharmD []  Laqueta Linden, PharmD, BCPS []  Albertina Parr, PharmD  [place] Team []  Leodis Sias, PharmD []  Lindell Spar, PharmD []  Royetta Asal, PharmD []  Graylin Shiver, Rph []  Rema Fendt) [place] Mac, PharmD []  Arlyn Dunning, PharmD []  Netta Cedars, PharmD []  Dia Sitter, PharmD []  Leone Haven, PharmD []  Gretta Arab, PharmD []  Theodis Shove, PharmD []  Peggyann Juba, PharmD []  Reuel Boom, PharmD   Positive urine culture Treated with Cephalexin, organism sensitive to the same and no further patient follow-up is required at this time.  Amy [name], Amy [name] [facility] 05/24/2019, 10:50 AM

## 2019-05-26 ENCOUNTER — Other Ambulatory Visit: Payer: Self-pay | Admitting: Pulmonary Disease

## 2019-05-26 ENCOUNTER — Other Ambulatory Visit (HOSPITAL_COMMUNITY): Payer: Self-pay | Admitting: Pulmonary Disease

## 2019-05-26 DIAGNOSIS — E1169 Type 2 diabetes mellitus with other specified complication: Secondary | ICD-10-CM | POA: Diagnosis not present

## 2019-05-26 DIAGNOSIS — M545 Low back pain: Secondary | ICD-10-CM | POA: Diagnosis not present

## 2019-05-26 DIAGNOSIS — M159 Polyosteoarthritis, unspecified: Secondary | ICD-10-CM | POA: Diagnosis not present

## 2019-05-26 DIAGNOSIS — I10 Essential (primary) hypertension: Secondary | ICD-10-CM | POA: Diagnosis not present

## 2019-05-26 DIAGNOSIS — I739 Peripheral vascular disease, unspecified: Secondary | ICD-10-CM

## 2019-05-26 DIAGNOSIS — R2243 Localized swelling, mass and lump, lower limb, bilateral: Secondary | ICD-10-CM

## 2019-05-27 DIAGNOSIS — M1990 Unspecified osteoarthritis, unspecified site: Secondary | ICD-10-CM | POA: Diagnosis not present

## 2019-05-27 DIAGNOSIS — Z87891 Personal history of nicotine dependence: Secondary | ICD-10-CM | POA: Diagnosis not present

## 2019-05-27 DIAGNOSIS — I69351 Hemiplegia and hemiparesis following cerebral infarction affecting right dominant side: Secondary | ICD-10-CM | POA: Diagnosis not present

## 2019-05-27 DIAGNOSIS — E119 Type 2 diabetes mellitus without complications: Secondary | ICD-10-CM | POA: Diagnosis not present

## 2019-05-27 DIAGNOSIS — C50911 Malignant neoplasm of unspecified site of right female breast: Secondary | ICD-10-CM | POA: Diagnosis not present

## 2019-05-27 DIAGNOSIS — M8580 Other specified disorders of bone density and structure, unspecified site: Secondary | ICD-10-CM | POA: Diagnosis not present

## 2019-05-27 DIAGNOSIS — E7800 Pure hypercholesterolemia, unspecified: Secondary | ICD-10-CM | POA: Diagnosis not present

## 2019-05-27 DIAGNOSIS — R69 Illness, unspecified: Secondary | ICD-10-CM | POA: Diagnosis not present

## 2019-06-01 ENCOUNTER — Encounter (HOSPITAL_COMMUNITY): Payer: Self-pay

## 2019-06-01 ENCOUNTER — Ambulatory Visit (HOSPITAL_COMMUNITY): Admission: RE | Admit: 2019-06-01 | Payer: Medicare HMO | Source: Ambulatory Visit

## 2019-06-02 ENCOUNTER — Other Ambulatory Visit: Payer: Self-pay | Admitting: Adult Health

## 2019-06-03 ENCOUNTER — Telehealth: Payer: Self-pay | Admitting: *Deleted

## 2019-06-03 NOTE — Telephone Encounter (Signed)
[employer] to ship a 30 day cardiac event monitor to your home.  Please review instructions included in the monitor kit.  After you have applied your monitor and turned it on, please call [employer] at [phone].  They will confirm your monitor it transmitting, record your baseline recording, and answer any questions regarding your monitors instructions.

## 2019-06-04 ENCOUNTER — Other Ambulatory Visit: Payer: Self-pay

## 2019-06-04 ENCOUNTER — Ambulatory Visit (HOSPITAL_COMMUNITY)
Admission: RE | Admit: 2019-06-04 | Discharge: 2019-06-04 | Disposition: A | Discharge status: Home / Self Care | Payer: Medicare HMO | Source: Ambulatory Visit | Attending: Pulmonary Disease | Admitting: Pulmonary Disease

## 2019-06-04 DIAGNOSIS — R600 Localized edema: Secondary | ICD-10-CM | POA: Diagnosis not present

## 2019-06-04 DIAGNOSIS — R2243 Localized swelling, mass and lump, lower limb, bilateral: Secondary | ICD-10-CM

## 2019-06-04 DIAGNOSIS — I739 Peripheral vascular disease, unspecified: Secondary | ICD-10-CM | POA: Diagnosis not present

## 2019-06-08 ENCOUNTER — Ambulatory Visit (HOSPITAL_COMMUNITY): Payer: Medicare HMO

## 2019-06-08 ENCOUNTER — Encounter (HOSPITAL_COMMUNITY): Payer: Medicare HMO

## 2019-06-08 ENCOUNTER — Inpatient Hospital Stay (HOSPITAL_COMMUNITY): Payer: Medicare HMO

## 2019-06-09 ENCOUNTER — Other Ambulatory Visit (HOSPITAL_COMMUNITY): Payer: Medicare HMO

## 2019-06-10 ENCOUNTER — Other Ambulatory Visit (HOSPITAL_COMMUNITY): Payer: Self-pay | Admitting: *Deleted

## 2019-06-10 ENCOUNTER — Other Ambulatory Visit (HOSPITAL_COMMUNITY): Payer: Self-pay | Admitting: Nurse Practitioner

## 2019-06-10 ENCOUNTER — Ambulatory Visit (INDEPENDENT_AMBULATORY_CARE_PROVIDER_SITE_OTHER): Payer: Medicare HMO

## 2019-06-10 ENCOUNTER — Telehealth (HOSPITAL_COMMUNITY): Payer: Self-pay | Admitting: *Deleted

## 2019-06-10 DIAGNOSIS — R002 Palpitations: Secondary | ICD-10-CM | POA: Diagnosis not present

## 2019-06-10 DIAGNOSIS — I4891 Unspecified atrial fibrillation: Secondary | ICD-10-CM | POA: Diagnosis not present

## 2019-06-10 DIAGNOSIS — I639 Cerebral infarction, unspecified: Secondary | ICD-10-CM

## 2019-06-10 DIAGNOSIS — C50911 Malignant neoplasm of unspecified site of right female breast: Secondary | ICD-10-CM

## 2019-06-10 NOTE — Addendum Note (Signed)
Addended by: Joie Bimler on: 06/10/2019 10:53 AM   Modules accepted: Orders

## 2019-06-14 ENCOUNTER — Other Ambulatory Visit (HOSPITAL_COMMUNITY): Payer: Medicare HMO

## 2019-06-15 ENCOUNTER — Ambulatory Visit (HOSPITAL_COMMUNITY): Payer: Medicare HMO

## 2019-06-15 ENCOUNTER — Ambulatory Visit (HOSPITAL_COMMUNITY): Payer: Medicare HMO | Admitting: Nurse Practitioner

## 2019-06-17 ENCOUNTER — Encounter (HOSPITAL_COMMUNITY): Payer: Self-pay | Admitting: Psychiatry

## 2019-06-17 ENCOUNTER — Ambulatory Visit (INDEPENDENT_AMBULATORY_CARE_PROVIDER_SITE_OTHER): Payer: Medicare HMO | Admitting: Psychiatry

## 2019-06-17 ENCOUNTER — Other Ambulatory Visit: Payer: Self-pay

## 2019-06-17 DIAGNOSIS — F330 Major depressive disorder, recurrent, mild: Secondary | ICD-10-CM | POA: Diagnosis not present

## 2019-06-17 DIAGNOSIS — R69 Illness, unspecified: Secondary | ICD-10-CM | POA: Diagnosis not present

## 2019-06-17 MED ORDER — VENLAFAXINE HCL ER 150 MG PO CP24: ORAL_CAPSULE | ORAL | 2 refills | Status: DC

## 2019-06-17 MED ORDER — ALPRAZOLAM 0.5 MG PO TABS: 0.5000 mg | ORAL_TABLET | Freq: Every day | ORAL | 2 refills | Status: DC | PRN

## 2019-06-17 MED ORDER — GABAPENTIN 300 MG PO CAPS: 300.0000 mg | ORAL_CAPSULE | Freq: Every day | ORAL | 2 refills | Status: DC

## 2019-06-17 MED ORDER — TRAZODONE HCL 50 MG PO TABS: ORAL_TABLET | ORAL | 2 refills | Status: DC

## 2019-06-17 MED ORDER — RISPERIDONE 1 MG PO TABS: 1.0000 mg | ORAL_TABLET | Freq: Every day | ORAL | 2 refills | Status: DC

## 2019-06-17 MED ORDER — ESCITALOPRAM OXALATE 20 MG PO TABS: 20.0000 mg | ORAL_TABLET | Freq: Every day | ORAL | 2 refills | Status: DC
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
## 2019-06-18 ENCOUNTER — Ambulatory Visit: Payer: Medicare HMO | Admitting: Cardiovascular Disease

## 2019-06-20 NOTE — Progress Notes (Deleted)
Cardiology Office Note   Date:  06/20/2019   ID:  Amy [name], DOB 1936/03/09, MRN [id number]  PCP:  Sinda Du, MD  Cardiologist:   [name], MD       History of Present Illness: Amy [name] is a 83 y.o. female with a history of       No outpatient medications have been marked as taking for the 06/21/19 encounter (Appointment) with Fay Records, MD.     Allergies:   Cymbalta [duloxetine hcl]   Past Medical History:  Diagnosis Date   Anxiety    Arthritis    Back pain    Colon polyps    Depression    Diabetes mellitus, type II ([place])    diet controlled   Hypercholesterolemia    Invasive ductal carcinoma of breast, female, right ([place])    Osteopenia 11/26/2016   Skin-picking disorder    has 3 open wounds on right wrist that are being treated. About a quarter in size.   Stroke [facility])     Past Surgical History:  Procedure Laterality Date   APPENDECTOMY     BACK SURGERY     BREAST BIOPSY Right 10/09/2016   Procedure: BREAST BIOPSY WITH NEEDLE LOCALIZATION;  Surgeon: [name], MD;  Location: [facility]S;  Service: General;  Laterality: Right;   BREAST SURGERY Right    BUBBLE STUDY  03/23/2019   Procedure: BUBBLE STUDY;  Surgeon: [name], MD;  Location: [facility] ENDOSCOPY;  Service: Cardiovascular;;   BUNIONECTOMY Bilateral    FACIAL COSMETIC SURGERY     drooping eyelid and brow   HERNIA REPAIR Right    Inguinal x2   KNEE ARTHROSCOPY Right    SHOULDER SURGERY Right    rotator cuff repair and removal of bone spur   TEE WITHOUT CARDIOVERSION N/A 03/23/2019   Procedure: TRANSESOPHAGEAL ECHOCARDIOGRAM (TEE);  Surgeon: [name], MD;  Location: [facility] ENDOSCOPY;  Service: Cardiovascular;  Laterality: N/A;   TEMPOROMANDIBULAR JOINT SURGERY Right    TOTAL KNEE ARTHROPLASTY Right 05/05/2018   Procedure: TOTAL KNEE ARTHROPLASTY;  Surgeon: [name], MD;  Location: [facility]S;  Service: Orthopedics;  Laterality:  Right;     Social History:  The patient  reports that she quit smoking about 23 years ago. Her smoking use included cigarettes. She has a 20.00 pack-year smoking history. She has never used smokeless tobacco. She reports that she does not drink alcohol or use drugs.   Family History:  The patient's family history includes Alcohol abuse in her brother; Anxiety disorder in her mother; Cancer - Other in her sister; Depression in her maternal grandmother and mother.    ROS:  Please see the history of present illness. All other systems are reviewed and  Negative to the above problem except as noted.    PHYSICAL EXAM: VS:  There were no vitals taken for this visit.  GEN: Well nourished, well developed, in no acute distress  HEENT: normal  Neck: no JVD, carotid bruits, or masses Cardiac: RRR; no murmurs, rubs, or gallops,no edema  Respiratory:  clear to auscultation bilaterally, normal work of breathing GI: soft, nontender, nondistended, + BS  No hepatomegaly  MS: no deformity Moving all extremities   Skin: warm and dry, no rash Neuro:  Strength and sensation are intact Psych: euthymic mood, full affect   EKG:  EKG is ordered today.   Lipid Panel    Component Value Date/Time   CHOL 172 05/06/2019 1451   TRIG  203 (H) 05/06/2019 1451   HDL 83 05/06/2019 1451   CHOLHDL 2.1 05/06/2019 1451   CHOLHDL 3.5 03/21/2019 0647   VLDL 35 03/21/2019 0647   LDLCALC 57 05/06/2019 1451      Wt Readings from Last 3 Encounters:  05/20/19 175 lb 0.7 oz (79.4 kg)  05/19/19 175 lb (79.4 kg)  05/06/19 167 lb 6.4 oz (75.9 kg)      ASSESSMENT AND PLAN:     Current medicines are reviewed at length with the patient today.  The patient does not have concerns regarding medicines.  Signed, [name], MD  06/20/2019 6:16 PM    [facility] [address], [place], [place]  [address] Phone: [phone]; Fax: [phone]

## 2019-06-21 ENCOUNTER — Ambulatory Visit: Payer: Medicare HMO | Admitting: Internal Medicine

## 2019-06-22 ENCOUNTER — Other Ambulatory Visit: Payer: Self-pay

## 2019-06-22 ENCOUNTER — Telehealth: Payer: Self-pay | Admitting: *Deleted

## 2019-06-22 ENCOUNTER — Ambulatory Visit (HOSPITAL_COMMUNITY)
Admission: RE | Admit: 2019-06-22 | Discharge: 2019-06-22 | Disposition: A | Discharge status: Home / Self Care | Payer: Medicare HMO | Source: Ambulatory Visit | Attending: Nurse Practitioner | Admitting: Nurse Practitioner

## 2019-06-22 ENCOUNTER — Ambulatory Visit (HOSPITAL_COMMUNITY): Admission: RE | Admit: 2019-06-22 | Payer: Medicare HMO | Source: Ambulatory Visit

## 2019-06-22 ENCOUNTER — Inpatient Hospital Stay (HOSPITAL_COMMUNITY): Payer: Medicare HMO | Attending: Hematology

## 2019-06-22 DIAGNOSIS — C50912 Malignant neoplasm of unspecified site of left female breast: Secondary | ICD-10-CM | POA: Insufficient documentation

## 2019-06-22 DIAGNOSIS — R921 Mammographic calcification found on diagnostic imaging of breast: Secondary | ICD-10-CM | POA: Insufficient documentation

## 2019-06-22 DIAGNOSIS — Z79811 Long term (current) use of aromatase inhibitors: Secondary | ICD-10-CM | POA: Insufficient documentation

## 2019-06-22 DIAGNOSIS — M8580 Other specified disorders of bone density and structure, unspecified site: Secondary | ICD-10-CM | POA: Diagnosis not present

## 2019-06-22 DIAGNOSIS — Z170 Estrogen receptor positive status [ER+]: Secondary | ICD-10-CM | POA: Diagnosis not present

## 2019-06-22 DIAGNOSIS — C50911 Malignant neoplasm of unspecified site of right female breast: Secondary | ICD-10-CM

## 2019-06-22 LAB — COMPREHENSIVE METABOLIC PANEL WITH GFR
ALT: 9 U/L (ref 0–44)
AST: 15 U/L (ref 15–41)
Albumin: 4 g/dL (ref 3.5–5.0)
Alkaline Phosphatase: 81 U/L (ref 38–126)
Anion gap: 10 (ref 5–15)
BUN: 23 mg/dL (ref 8–23)
CO2: 27 mmol/L (ref 22–32)
Calcium: 9.2 mg/dL (ref 8.9–10.3)
Chloride: 101 mmol/L (ref 98–111)
Creatinine, Ser: 0.92 mg/dL (ref 0.44–1.00)
GFR calc Af Amer: >60 mL/min
GFR calc non Af Amer: 58 mL/min — ABNORMAL LOW
Glucose, Bld: 159 mg/dL — ABNORMAL HIGH (ref 70–99)
Potassium: 4.5 mmol/L (ref 3.5–5.1)
Sodium: 138 mmol/L (ref 135–145)
Total Bilirubin: 0.2 mg/dL — ABNORMAL LOW (ref 0.3–1.2)
Total Protein: 6.9 g/dL (ref 6.5–8.1)

## 2019-06-22 LAB — CBC WITH DIFFERENTIAL/PLATELET
Abs Immature Granulocytes: 0.01 10*3/uL (ref 0.00–0.07)
Basophils Absolute: 0 10*3/uL (ref 0.0–0.1)
Basophils Relative: 1 %
Eosinophils Absolute: 0.2 10*3/uL (ref 0.0–0.5)
Eosinophils Relative: 3 %
HCT: 43.9 % (ref 36.0–46.0)
Hemoglobin: 14.1 g/dL (ref 12.0–15.0)
Immature Granulocytes: 0 %
Lymphocytes Relative: 19 %
Lymphs Abs: 1.5 10*3/uL (ref 0.7–4.0)
MCH: 29.7 pg (ref 26.0–34.0)
MCHC: 32.1 g/dL (ref 30.0–36.0)
MCV: 92.4 fL (ref 80.0–100.0)
Monocytes Absolute: 0.5 10*3/uL (ref 0.1–1.0)
Monocytes Relative: 7 %
Neutro Abs: 5.5 10*3/uL (ref 1.7–7.7)
Neutrophils Relative %: 70 %
Platelets: 256 10*3/uL (ref 150–400)
RBC: 4.75 MIL/uL (ref 3.87–5.11)
RDW: 11.9 % (ref 11.5–15.5)
WBC: 7.8 10*3/uL (ref 4.0–10.5)
nRBC: 0 % (ref 0.0–0.2)

## 2019-06-22 LAB — VITAMIN D 25 HYDROXY (VIT D DEFICIENCY, FRACTURES): Vit D, 25-Hydroxy: 42.68 ng/mL (ref 30–100)

## 2019-06-22 LAB — VITAMIN B12: Vitamin B-12: 246 pg/mL (ref 180–914)

## 2019-06-22 NOTE — Telephone Encounter (Signed)
I have added surgery clearance to appt with Dr. [name] on 08/20/19.  I will forward to surgeon as FYI.

## 2019-06-22 NOTE — Telephone Encounter (Signed)
   [facility] Pre-operative Risk Assessment    Request for surgical clearance:  1. What type of surgery is being performed? REVISION POSTERIOR LATERAL FUSION L3-S1 WITH S2/ALAR ILIAC FIXATION   2. When is this surgery scheduled? TBD   3. What type of clearance is required (medical clearance vs. Pharmacy clearance to hold med vs. Both)? MEDICAL  4. Are there any medications that need to be held prior to surgery and how long? ASA   5. Practice name and name of physician performing surgery? [place]; DR. Mallie Mussel POOL   6. What is your office phone number [phone]    7.   What is your office fax number [phone] ATTN VANESSA  8.   Anesthesia type (None, local, MAC, general) ? GENERAL   Amy [name] 06/22/2019, 12:03 PM  _________________________________________________________________   (provider comments below)

## 2019-06-22 NOTE — Telephone Encounter (Signed)
   Primary Cardiologist:No primary care provider on file.  Chart reviewed as part of pre-operative protocol coverage. Because of Amy [name]'s past medical history and time since last visit, he/she will require a follow-up visit in order to better assess preoperative cardiovascular risk.  Pt was seen in the hospital in 02/2019 in the setting of stroke and found to have LV systolic dysfunction.  She was seen by advanced heart failure, Dr. Haroldine Laws, and started on some guideline directed medical therapy.  She was also to have a 30-day cardiac monitor to evaluate for possible A. fib because of stroke.  She did not have any follow-up with general cardiology for her heart failure. She was seen on 05/19/2019 by [name], PA.  Apparently the heart monitor was never done so he ordered that and should be in process at this time.  She was arranged for general cardiology follow-up with Dr. [name] on 08/20/2019.  The patient will need evaluation in the office by general cardiology before she can be cleared for surgery. Hopefully the surgery can be delayed until after her appointment in January.  If the surgery is more urgent please contact our office.   Daune Perch, NP  06/22/2019, 2:07 PM

## 2019-06-28 ENCOUNTER — Ambulatory Visit (HOSPITAL_COMMUNITY): Payer: Medicare HMO | Admitting: Nurse Practitioner

## 2019-06-28 ENCOUNTER — Ambulatory Visit (HOSPITAL_COMMUNITY): Payer: Medicare HMO

## 2019-06-28 ENCOUNTER — Other Ambulatory Visit: Payer: Self-pay | Admitting: Adult Health

## 2019-06-28 DIAGNOSIS — C50911 Malignant neoplasm of unspecified site of right female breast: Secondary | ICD-10-CM

## 2019-06-28 NOTE — Assessment & Plan Note (Deleted)
1.  Stage Ia right breast cancer: - Status post lumpectomy with Dr. Jenkins on 10/09/2016. - Invasive ductal carcinoma of the right breast, ER/PR positive, HER-2 negative. -Received no radiation. -Started on anastrozole in March 2018.  She did not take anastrozole from 05/17/2017 through 10/15/2017 likely due from intolerance. -She is now taking letrozole 2.5 mg without any major problems.  She denies any hot flashes or musculoskeletal problems. - Physical examination today did not reveal any palpable masses.  Right lumpectomy site in the medial quadrant is within normal limits. - Her last mammogram was on 12/01/2018 which was BI-RADS Category 3 probably benign. -We will repeat a diagnostic mammogram in 6 months. -We will follow-up with her after the mammogram with labs. -  2.  Osteopenia: - Her last DEXA scan was on 11/26/2017 showed a T score of -1.7. -She was started on Prolia every 6 months on 12/19/2016. -Her last Prolia injection was on 06/09/2018. - Labs on 12/01/2018 showed her calcium level 9.1 - She will proceed with her Prolia injection today 

## 2019-06-28 NOTE — Progress Notes (Deleted)
Amy [name], [place] [address]   CLINIC:  Medical Oncology/Hematology  PCP:  Sinda Du, MD [address] [place] [address] [phone]   REASON FOR VISIT: Follow-up for right breast cancer  CURRENT THERAPY: Letrozole  BRIEF ONCOLOGIC HISTORY:  Oncology History  Invasive ductal carcinoma of breast, female, right ([place])  10/09/2016 Procedure   Left breast lumpectomy by Dr. Arnoldo Morale   10/16/2016 Pathology Results   Invasive, grade 1, ductal carcinoma, spanning 0.18 cmIn greatest dimension.  Low and intermediate grade DCIS with associated calcifications.  Intraductal papilloma formation with associated low-grade DCIS.  DCIS is less than 0.1 cm from the margin in multiple areas including intermediate ductal carcinoma in situ adjacent to cauterized edge.    10/16/2016 Pathology Results   ER +100%, PR +90%, HER-2 negative.   10/23/2016 Cancer Staging   Cancer Staging Invasive ductal carcinoma of breast, female, right [facility]) Staging form: Breast, AJCC 8th Edition - Pathologic stage from 10/23/2016: Stage Unknown (pT1a, pNX, cM0, G1, ER: Positive, PR: Positive, HER2: Negative) - Signed by Baird Cancer, PA-C on 10/23/2016    11/14/2016 Imaging   CT CAP-  1. No compelling findings of metastatic breast cancer. 2. Acute mild diverticulitis of the descending colon. 3. Further distally in the descending colon but there is a cluster of small lymph nodes adjacent to the colon. Although possibly related to the diverticulitis this is a bit distend and this type of clustered nodes is unusual in this location. Following resolution of the acute process, consider colonoscopy to exclude an underlying occult cancer. 4. 3.7 cm fatty mass along the lumen of the descending colon favors a lipoma or fatty polyp. 5. Aortic Atherosclerosis (ICD10-I70.0). 6. Minimal tree-in-bud reticulonodular opacities in the left lower lobe characteristic for atypical  infectious bronchiolitis. 7. Left groin hernia containing adipose tissue, probably a direct inguinal hernia. 8. Exaggerated lumbar lordosis with grade 1 degenerative anterolisthesis at L4-5.   11/26/2016 Imaging   Bone density- BMD as determined from Femur Neck Left is 0.820 g/cm2 with a T-Score of -1.6.  This patient is considered OSTEOPENIC according to [facility]) criteria.      CANCER STAGING: Cancer Staging Invasive ductal carcinoma of breast, female, right [facility]) Staging form: Breast, AJCC 8th Edition - Pathologic stage from 10/23/2016: Stage Unknown (pT1a, pNX, cM0, G1, ER: Positive, PR: Positive, HER2: Negative) - Signed by Baird Cancer, PA-C on 10/23/2016    INTERVAL HISTORY:  Amy [name] 83 y.o. female returns for routine follow-up for right breast cancer. Denies any nausea, vomiting, or diarrhea. Denies any new pains. Had not noticed any recent bleeding such as epistaxis, hematuria or hematochezia. Denies recent chest pain on exertion, shortness of breath on minimal exertion, pre-syncopal episodes, or palpitations. Denies any numbness or tingling in hands or feet. Denies any recent fevers, infections, or recent hospitalizations. Patient reports appetite at ***% and energy level at ***%.  She is eating well maintain her weight this time.    REVIEW OF SYSTEMS:  Review of Systems - Oncology   PAST MEDICAL/SURGICAL HISTORY:  Past Medical History:  Diagnosis Date  . Anxiety   . Arthritis   . Back pain   . Colon polyps   . Depression   . Diabetes mellitus, type II ([place])    diet controlled  . Hypercholesterolemia   . Invasive ductal carcinoma of breast, female, right ([place])   . Osteopenia 11/26/2016  . Skin-picking disorder    has 3  open wounds on right wrist that are being treated. About a quarter in size.  . Stroke [facility])    Past Surgical History:  Procedure Laterality Date  . APPENDECTOMY    . BACK SURGERY    . BREAST BIOPSY Right  10/09/2016   Procedure: BREAST BIOPSY WITH NEEDLE LOCALIZATION;  Surgeon: [name], MD;  Location: [facility] ORS;  Service: General;  Laterality: Right;  . BREAST SURGERY Right   . BUBBLE STUDY  03/23/2019   Procedure: BUBBLE STUDY;  Surgeon: [name], MD;  Location: [facility] ENDOSCOPY;  Service: Cardiovascular;;  . BUNIONECTOMY Bilateral   . FACIAL COSMETIC SURGERY     drooping eyelid and brow  . HERNIA REPAIR Right    Inguinal x2  . KNEE ARTHROSCOPY Right   . SHOULDER SURGERY Right    rotator cuff repair and removal of bone spur  . TEE WITHOUT CARDIOVERSION N/A 03/23/2019   Procedure: TRANSESOPHAGEAL ECHOCARDIOGRAM (TEE);  Surgeon: [name], MD;  Location: [facility] ENDOSCOPY;  Service: Cardiovascular;  Laterality: N/A;  . TEMPOROMANDIBULAR JOINT SURGERY Right   . TOTAL KNEE ARTHROPLASTY Right 05/05/2018   Procedure: TOTAL KNEE ARTHROPLASTY;  Surgeon: [name], MD;  Location: [facility] ORS;  Service: Orthopedics;  Laterality: Right;     SOCIAL HISTORY:  Social History   Socioeconomic History  . Marital status: Divorced    Spouse name: Not on file  . Number of children: Not on file  . Years of education: Not on file  . Highest education level: Not on file  Occupational History  . 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 Financial resource strain: Not hard at all  . Food insecurity    Worry: Never true    Inability: Never true  . Transportation needs    Medical: No    Non-medical: No  Tobacco Use  . Smoking status: Former Smoker    Packs/day: 0.50    Years: 40.00    Pack years: 20.00    Types: Cigarettes    Quit date: 09/04/1995    Years since quitting: 23.8  . Smokeless tobacco: Never Used  Substance and Sexual Activity  . Alcohol use: No  . Drug use: No    Types: Benzodiazepines, Hydrocodone  . Sexual activity: Never  Lifestyle  . Physical activity    Days per week: 0 days    Minutes per session: 0 min  . Stress: Only a little   Relationships  . Social Herbalist on phone: Once a week    Gets together: Never    Attends religious service: More than 4 times per year    Active member of club or organization: No    Attends meetings of clubs or organizations: Never    Relationship status: Divorced  . Intimate partner violence    Fear of current or ex partner: No    Emotionally abused: No    Physically abused: No    Forced sexual activity: No  Other Topics Concern  . Not on file  Social History Narrative   Divorced.  Former smoker, quit in 1997.  Nondrinker.     FAMILY HISTORY:  Family History  Problem Relation Age of Onset  . Anxiety disorder Mother   . Depression Mother   . Depression Maternal Grandmother   . Alcohol abuse Brother   . Cancer - Other Sister   . ADD / ADHD Neg Hx   . Bipolar disorder Neg Hx   .  Dementia Neg Hx   . Drug abuse Neg Hx   . OCD Neg Hx   . Paranoid behavior Neg Hx   . Schizophrenia Neg Hx   . Seizures Neg Hx   . Sexual abuse Neg Hx   . Physical abuse Neg Hx     CURRENT MEDICATIONS:  Outpatient Encounter Medications as of 06/28/2019  Medication Sig  . ALPRAZolam (XANAX) 0.5 MG tablet Take 1 tablet (0.5 mg total) by mouth daily as needed for anxiety.  Marland Kitchen aspirin EC 81 MG tablet Take 81 mg by mouth every morning.  Marland Kitchen atorvastatin (LIPITOR) 40 MG tablet Take 1 tablet (40 mg total) by mouth daily at 6 PM.  . calcium carbonate (OSCAL) 1500 (600 Ca) MG TABS tablet Take 600 mg of elemental calcium by mouth daily with supper.   . carvedilol (COREG) 3.125 MG tablet Take 1 tablet (3.125 mg total) by mouth 2 (two) times daily.  . cephALEXin (KEFLEX) 500 MG capsule Take 1 capsule (500 mg total) by mouth 4 (four) times daily.  . cholecalciferol (VITAMIN D) 1000 units tablet Take 1 tablet (1,000 Units total) by mouth daily.  Marland Kitchen escitalopram (LEXAPRO) 20 MG tablet Take 1 tablet (20 mg total) by mouth daily.  Marland Kitchen gabapentin (NEURONTIN) 300 MG capsule Take 1 capsule (300 mg total)  by mouth at bedtime.  Marland Kitchen ibuprofen (ADVIL) 800 MG tablet TAKE 1 TABLET(800 MG) BY MOUTH EVERY 8 HOURS AS NEEDED (Patient taking differently: Take 800 mg by mouth every 8 (eight) hours as needed for mild pain or moderate pain. )  . letrozole (FEMARA) 2.5 MG tablet TAKE 1 TABLET(2.5 MG) BY MOUTH DAILY (Patient taking differently: Take 2.5 mg by mouth every morning. )  . losartan (COZAAR) 25 MG tablet Take 0.5 tablets (12.5 mg total) by mouth at bedtime.  . metFORMIN (GLUCOPHAGE) 500 MG tablet Take 1 tablet (500 mg total) by mouth 2 (two) times daily with a meal.  . risperiDONE (RISPERDAL) 1 MG tablet Take 1 tablet (1 mg total) by mouth at bedtime.  . traZODone (DESYREL) 50 MG tablet TAKE 1 TABLET(50 MG) BY MOUTH AT BEDTIME  . venlafaxine XR (EFFEXOR-XR) 150 MG 24 hr capsule TAKE 1 CAPSULE(150 MG) BY MOUTH TWICE DAILY   No facility-administered encounter medications on file as of 06/28/2019.     ALLERGIES:  Allergies  Allergen Reactions  . Cymbalta [Duloxetine Hcl] Other (See Comments)    Hallucinated and couldn't think; got worse and worse the longer she took this.     PHYSICAL EXAM:  ECOG Performance status: 1  There were no vitals filed for this visit. There were no vitals filed for this visit.  Physical Exam   LABORATORY DATA:  I have reviewed the labs as listed.  CBC    Component Value Date/Time   WBC 7.8 06/22/2019 1233   RBC 4.75 06/22/2019 1233   HGB 14.1 06/22/2019 1233   HCT 43.9 06/22/2019 1233   PLT 256 06/22/2019 1233   MCV 92.4 06/22/2019 1233   MCH 29.7 06/22/2019 1233   MCHC 32.1 06/22/2019 1233   RDW 11.9 06/22/2019 1233   LYMPHSABS 1.5 06/22/2019 1233   MONOABS 0.5 06/22/2019 1233   EOSABS 0.2 06/22/2019 1233   BASOSABS 0.0 06/22/2019 1233   CMP Latest Ref Rng & Units 06/22/2019 05/20/2019 03/20/2019  Glucose 70 - 99 mg/dL 159(H) 100(H) 113(H)  BUN 8 - 23 mg/dL '23 16 19  '$ Creatinine 0.44 - 1.00 mg/dL 0.92 0.75 0.80  Sodium 135 - 145  mmol/L 138 138 135   Potassium 3.5 - 5.1 mmol/L 4.5 3.4(L) 4.2  Chloride 98 - 111 mmol/L 101 103 102  CO2 22 - 32 mmol/L 27 26 -  Calcium 8.9 - 10.3 mg/dL 9.2 8.9 -  Total Protein 6.5 - 8.1 g/dL 6.9 6.4(L) -  Total Bilirubin 0.3 - 1.2 mg/dL 0.2(L) 0.4 -  Alkaline Phos 38 - 126 U/L 81 68 -  AST 15 - 41 U/L 15 17 -  ALT 0 - 44 U/L 9 12 -       DIAGNOSTIC IMAGING:  I have independently reviewed the scans and discussed with the patient.   I have reviewed Amy Finders, NP's note and agree with the documentation.  I personally performed a face-to-face visit, made revisions and my assessment and plan is as follows.    ASSESSMENT & PLAN:   Invasive ductal carcinoma of breast, female, right ([place]) 1.  Stage Ia right breast cancer: - Status post lumpectomy with Dr. Arnoldo Morale on 10/09/2016. - Invasive ductal carcinoma of the right breast, ER/PR positive, HER-2 negative. -Received no radiation. -Started on anastrozole in March 2018.  She did not take anastrozole from 05/17/2017 through 10/15/2017 likely due from intolerance. -She is now taking letrozole 2.5 mg without any major problems.  She denies any hot flashes or musculoskeletal problems. - Physical examination today did not reveal any palpable masses.  Right lumpectomy site in the medial quadrant is within normal limits. - Her last mammogram was on 12/01/2018 which was BI-RADS Category 3 probably benign. -We will repeat a diagnostic mammogram in 6 months. -We will follow-up with her after the mammogram with labs. -  2.  Osteopenia: - Her last DEXA scan was on 11/26/2017 showed a T score of -1.7. -She was started on Prolia every 6 months on 12/19/2016. -Her last Prolia injection was on 06/09/2018. - Labs on 12/01/2018 showed her calcium level 9.1 - She will proceed with her Prolia injection today      Orders placed this encounter:  No orders of the defined types were placed in this encounter.     Amy Finders, FNP-C [place]  [phone]

## 2019-06-30 ENCOUNTER — Encounter: Payer: Self-pay | Admitting: Orthopedic Surgery

## 2019-07-01 ENCOUNTER — Other Ambulatory Visit: Payer: Self-pay

## 2019-07-02 ENCOUNTER — Inpatient Hospital Stay (HOSPITAL_COMMUNITY): Payer: Medicare HMO | Attending: Nurse Practitioner | Admitting: Nurse Practitioner

## 2019-07-02 ENCOUNTER — Other Ambulatory Visit (HOSPITAL_COMMUNITY): Payer: Self-pay | Admitting: Nurse Practitioner

## 2019-07-02 ENCOUNTER — Inpatient Hospital Stay (HOSPITAL_COMMUNITY): Payer: Medicare HMO

## 2019-07-02 DIAGNOSIS — Z8673 Personal history of transient ischemic attack (TIA), and cerebral infarction without residual deficits: Secondary | ICD-10-CM | POA: Diagnosis not present

## 2019-07-02 DIAGNOSIS — Z170 Estrogen receptor positive status [ER+]: Secondary | ICD-10-CM | POA: Insufficient documentation

## 2019-07-02 DIAGNOSIS — E7800 Pure hypercholesterolemia, unspecified: Secondary | ICD-10-CM | POA: Insufficient documentation

## 2019-07-02 DIAGNOSIS — Z79899 Other long term (current) drug therapy: Secondary | ICD-10-CM | POA: Diagnosis not present

## 2019-07-02 DIAGNOSIS — Z87891 Personal history of nicotine dependence: Secondary | ICD-10-CM | POA: Insufficient documentation

## 2019-07-02 DIAGNOSIS — Z7982 Long term (current) use of aspirin: Secondary | ICD-10-CM | POA: Diagnosis not present

## 2019-07-02 DIAGNOSIS — M818 Other osteoporosis without current pathological fracture: Secondary | ICD-10-CM

## 2019-07-02 DIAGNOSIS — Z86010 Personal history of colonic polyps: Secondary | ICD-10-CM | POA: Insufficient documentation

## 2019-07-02 DIAGNOSIS — M129 Arthropathy, unspecified: Secondary | ICD-10-CM | POA: Diagnosis not present

## 2019-07-02 DIAGNOSIS — C50911 Malignant neoplasm of unspecified site of right female breast: Secondary | ICD-10-CM

## 2019-07-02 DIAGNOSIS — M8580 Other specified disorders of bone density and structure, unspecified site: Secondary | ICD-10-CM | POA: Insufficient documentation

## 2019-07-02 DIAGNOSIS — F329 Major depressive disorder, single episode, unspecified: Secondary | ICD-10-CM | POA: Diagnosis not present

## 2019-07-02 DIAGNOSIS — Z79811 Long term (current) use of aromatase inhibitors: Secondary | ICD-10-CM | POA: Insufficient documentation

## 2019-07-02 DIAGNOSIS — F418 Other specified anxiety disorders: Secondary | ICD-10-CM | POA: Insufficient documentation

## 2019-07-02 DIAGNOSIS — E119 Type 2 diabetes mellitus without complications: Secondary | ICD-10-CM | POA: Insufficient documentation

## 2019-07-02 DIAGNOSIS — R69 Illness, unspecified: Secondary | ICD-10-CM | POA: Diagnosis not present

## 2019-07-02 MED ORDER — DENOSUMAB 60 MG/ML ~~LOC~~ SOSY: 60.0000 mg | PREFILLED_SYRINGE | Freq: Once | SUBCUTANEOUS | Status: DC

## 2019-07-02 MED ORDER — DENOSUMAB 60 MG/ML ~~LOC~~ SOSY
60.0000 mg | PREFILLED_SYRINGE | Freq: Once | SUBCUTANEOUS | Status: AC
  Administered 2019-07-02: 60 mg via SUBCUTANEOUS
  Filled 2019-07-02: qty 1

## 2019-07-02 MED ORDER — LETROZOLE 2.5 MG PO TABS: ORAL_TABLET | ORAL | 3 refills | Status: DC

## 2019-07-02 NOTE — Patient Instructions (Signed)
[facility] at [facility] Discharge Instructions  Follow up in 6 months with labs and mammogram   Thank you for choosing [facility] at [facility] to provide your oncology and hematology care.  To afford each patient quality time with our provider, please arrive at least 15 minutes before your scheduled appointment time.   If you have a lab appointment with the Cancer Center please come in thru the Main Entrance and check in at the main information desk.  You need to re-schedule your appointment should you arrive 10 or more minutes late.  We strive to give you quality time with our providers, and arriving late affects you and other patients whose appointments are after yours.  Also, if you no show three or more times for appointments you may be dismissed from the clinic at the providers discretion.     Again, thank you for choosing [facility].  Our hope is that these requests will decrease the amount of time that you wait before being seen by our physicians.       _____________________________________________________________  Should you have questions after your visit to  Cancer Center, please contact our office at [phone] between the hours of 8:00 a.m. and 4:30 p.m.  Voicemails left after 4:00 p.m. will not be returned until the following business day.  For prescription refill requests, have your pharmacy contact our office and allow 72 hours.    Due to Covid, you will need to wear a mask upon entering the hospital. If you do not have a mask, a mask will be given to you at the Main Entrance upon arrival. For doctor visits, patients may have 1 support person with them. For treatment visits, patients can not have anyone with them due to social distancing guidelines and our immunocompromised population.      

## 2019-07-02 NOTE — Patient Instructions (Signed)
[place] at [facility] Discharge Instructions  You were given your 6 month Prolia injection today. Return in 6 months for your next injection.  Thank you for choosing [place] at [facility] to provide your oncology and hematology care.  To afford each patient quality time with our provider, please arrive at least 15 minutes before your scheduled appointment time.   If you have a lab appointment with the [place] please come in thru the  Main Entrance and check in at the main information desk  You need to re-schedule your appointment should you arrive 10 or more minutes late.  We strive to give you quality time with our providers, and arriving late affects you and other patients whose appointments are after yours.  Also, if you no show three or more times for appointments you may be dismissed from the clinic at the providers discretion.     Again, thank you for choosing [facility].  Our hope is that these requests will decrease the amount of time that you wait before being seen by our physicians.       _____________________________________________________________  Should you have questions after your visit to [facility], please contact our office at [phone] between the hours of 8:00 a.m. and 4:30 p.m.  Voicemails left after 4:00 p.m. will not be returned until the following business day.  For prescription refill requests, have your pharmacy contact our office and allow 72 hours.    Cancer Center Support Programs:   > Cancer Support Group  2nd Tuesday of the month 1pm-2pm, Journey Room

## 2019-07-02 NOTE — Progress Notes (Signed)
Amy [name], [place] [address]   CLINIC:  Medical Oncology/Hematology  PCP:  Sinda Du, MD [address] [place] [address] [phone]   REASON FOR VISIT: Follow-up for breast cancer  CURRENT THERAPY: Letrozole  BRIEF ONCOLOGIC HISTORY:  Oncology History  Invasive ductal carcinoma of breast, female, right ([place])  10/09/2016 Procedure   Left breast lumpectomy by Dr. Arnoldo Morale   10/16/2016 Pathology Results   Invasive, grade 1, ductal carcinoma, spanning 0.18 cmIn greatest dimension.  Low and intermediate grade DCIS with associated calcifications.  Intraductal papilloma formation with associated low-grade DCIS.  DCIS is less than 0.1 cm from the margin in multiple areas including intermediate ductal carcinoma in situ adjacent to cauterized edge.    10/16/2016 Pathology Results   ER +100%, PR +90%, HER-2 negative.   10/23/2016 Cancer Staging   Cancer Staging Invasive ductal carcinoma of breast, female, right [facility]) Staging form: Breast, AJCC 8th Edition - Pathologic stage from 10/23/2016: Stage Unknown (pT1a, pNX, cM0, G1, ER: Positive, PR: Positive, HER2: Negative) - Signed by Baird Cancer, PA-C on 10/23/2016    11/14/2016 Imaging   CT CAP-  1. No compelling findings of metastatic breast cancer. 2. Acute mild diverticulitis of the descending colon. 3. Further distally in the descending colon but there is a cluster of small lymph nodes adjacent to the colon. Although possibly related to the diverticulitis this is a bit distend and this type of clustered nodes is unusual in this location. Following resolution of the acute process, consider colonoscopy to exclude an underlying occult cancer. 4. 3.7 cm fatty mass along the lumen of the descending colon favors a lipoma or fatty polyp. 5. Aortic Atherosclerosis (ICD10-I70.0). 6. Minimal tree-in-bud reticulonodular opacities in the left lower lobe characteristic for atypical  infectious bronchiolitis. 7. Left groin hernia containing adipose tissue, probably a direct inguinal hernia. 8. Exaggerated lumbar lordosis with grade 1 degenerative anterolisthesis at L4-5.   11/26/2016 Imaging   Bone density- BMD as determined from Femur Neck Left is 0.820 g/cm2 with a T-Score of -1.6.  This patient is considered OSTEOPENIC according to [facility]) criteria.      CANCER STAGING: Cancer Staging Invasive ductal carcinoma of breast, female, right [facility]) Staging form: Breast, AJCC 8th Edition - Pathologic stage from 10/23/2016: Stage Unknown (pT1a, pNX, cM0, G1, ER: Positive, PR: Positive, HER2: Negative) - Signed by Baird Cancer, PA-C on 10/23/2016    INTERVAL HISTORY:  Amy [name] 83 y.o. female returns for routine follow-up for breast cancer.  Patient reports she has been doing well since her last visit.  She is taking her letrozole with no side effects at this time. Denies any nausea, vomiting, or diarrhea. Denies any new pains. Had not noticed any recent bleeding such as epistaxis, hematuria or hematochezia. Denies recent chest pain on exertion, shortness of breath on minimal exertion, pre-syncopal episodes, or palpitations. Denies any numbness or tingling in hands or feet. Denies any recent fevers, infections, or recent hospitalizations. Patient reports appetite at 100% and energy level at 25%.  She is eating well maintaining her weight at this time.   REVIEW OF SYSTEMS:  Review of Systems  All other systems reviewed and are negative.    PAST MEDICAL/SURGICAL HISTORY:  Past Medical History:  Diagnosis Date  . Anxiety   . Arthritis   . Back pain   . Colon polyps   . Depression   . Diabetes mellitus, type II ([place])  diet controlled  . Hypercholesterolemia   . Invasive ductal carcinoma of breast, female, right ([place])   . Osteopenia 11/26/2016  . Skin-picking disorder    has 3 open wounds on right wrist that are being treated. About  a quarter in size.  . Stroke [facility])    Past Surgical History:  Procedure Laterality Date  . APPENDECTOMY    . BACK SURGERY    . BREAST BIOPSY Right 10/09/2016   Procedure: BREAST BIOPSY WITH NEEDLE LOCALIZATION;  Surgeon: [name], MD;  Location: [facility]S;  Service: General;  Laterality: Right;  . BREAST SURGERY Right   . BUBBLE STUDY  03/23/2019   Procedure: BUBBLE STUDY;  Surgeon: [name], MD;  Location: [facility] ENDOSCOPY;  Service: Cardiovascular;;  . BUNIONECTOMY Bilateral   . FACIAL COSMETIC SURGERY     drooping eyelid and brow  . HERNIA REPAIR Right    Inguinal x2  . KNEE ARTHROSCOPY Right   . SHOULDER SURGERY Right    rotator cuff repair and removal of bone spur  . TEE WITHOUT CARDIOVERSION N/A 03/23/2019   Procedure: TRANSESOPHAGEAL ECHOCARDIOGRAM (TEE);  Surgeon: [name], MD;  Location: [facility] ENDOSCOPY;  Service: Cardiovascular;  Laterality: N/A;  . TEMPOROMANDIBULAR JOINT SURGERY Right   . TOTAL KNEE ARTHROPLASTY Right 05/05/2018   Procedure: TOTAL KNEE ARTHROPLASTY;  Surgeon: [name], MD;  Location: [facility] ORS;  Service: Orthopedics;  Laterality: Right;     SOCIAL HISTORY:  Social History   Socioeconomic History  . Marital status: Divorced    Spouse name: Not on file  . Number of children: Not on file  . Years of education: Not on file  . Highest education level: Not on file  Occupational History  . 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 Financial resource strain: Not hard at all  . Food insecurity    Worry: Never true    Inability: Never true  . Transportation needs    Medical: No    Non-medical: No  Tobacco Use  . Smoking status: Former Smoker    Packs/day: 0.50    Years: 40.00    Pack years: 20.00    Types: Cigarettes    Quit date: 09/04/1995    Years since quitting: 23.8  . Smokeless tobacco: Never Used  Substance and Sexual Activity  . Alcohol use: No  . Drug use: No    Types: Benzodiazepines, Hydrocodone   . Sexual activity: Never  Lifestyle  . Physical activity    Days per week: 0 days    Minutes per session: 0 min  . Stress: Only a little  Relationships  . Social Herbalist on phone: Once a week    Gets together: Never    Attends religious service: More than 4 times per year    Active member of club or organization: No    Attends meetings of clubs or organizations: Never    Relationship status: Divorced  . Intimate partner violence    Fear of current or ex partner: No    Emotionally abused: No    Physically abused: No    Forced sexual activity: No  Other Topics Concern  . Not on file  Social History Narrative   Divorced.  Former smoker, quit in 1997.  Nondrinker.     FAMILY HISTORY:  Family History  Problem Relation Age of Onset  . Anxiety disorder Mother   . Depression Mother   . Depression Maternal Grandmother   .  Alcohol abuse Brother   . Cancer - Other Sister   . ADD / ADHD Neg Hx   . Bipolar disorder Neg Hx   . Dementia Neg Hx   . Drug abuse Neg Hx   . OCD Neg Hx   . Paranoid behavior Neg Hx   . Schizophrenia Neg Hx   . Seizures Neg Hx   . Sexual abuse Neg Hx   . Physical abuse Neg Hx     CURRENT MEDICATIONS:  Outpatient Encounter Medications as of 07/02/2019  Medication Sig  . aspirin EC 81 MG tablet Take 81 mg by mouth every morning.  Marland Kitchen atorvastatin (LIPITOR) 40 MG tablet Take 1 tablet (40 mg total) by mouth daily at 6 PM.  . calcium carbonate (OSCAL) 1500 (600 Ca) MG TABS tablet Take 600 mg of elemental calcium by mouth daily with supper.   . carvedilol (COREG) 3.125 MG tablet Take 1 tablet (3.125 mg total) by mouth 2 (two) times daily.  . cholecalciferol (VITAMIN D) 1000 units tablet Take 1 tablet (1,000 Units total) by mouth daily.  Marland Kitchen escitalopram (LEXAPRO) 20 MG tablet Take 1 tablet (20 mg total) by mouth daily.  Marland Kitchen gabapentin (NEURONTIN) 300 MG capsule Take 1 capsule (300 mg total) by mouth at bedtime.  Marland Kitchen losartan (COZAAR) 25 MG tablet Take  0.5 tablets (12.5 mg total) by mouth at bedtime.  . metFORMIN (GLUCOPHAGE) 500 MG tablet Take 1 tablet (500 mg total) by mouth 2 (two) times daily with a meal.  . risperiDONE (RISPERDAL) 1 MG tablet Take 1 tablet (1 mg total) by mouth at bedtime.  . traZODone (DESYREL) 50 MG tablet TAKE 1 TABLET(50 MG) BY MOUTH AT BEDTIME  . venlafaxine XR (EFFEXOR-XR) 150 MG 24 hr capsule TAKE 1 CAPSULE(150 MG) BY MOUTH TWICE DAILY  . ALPRAZolam (XANAX) 0.5 MG tablet Take 1 tablet (0.5 mg total) by mouth daily as needed for anxiety. (Patient not taking: Reported on 07/02/2019)  . ibuprofen (ADVIL) 800 MG tablet TAKE 1 TABLET(800 MG) BY MOUTH EVERY 8 HOURS AS NEEDED (Patient not taking: No sig reported)  . letrozole (FEMARA) 2.5 MG tablet TAKE 1 TABLET(2.5 MG) BY MOUTH DAILY (Patient taking differently: Take 2.5 mg by mouth every morning. )  . oxyCODONE-acetaminophen (PERCOCET/ROXICET) 5-325 MG tablet Take 1 tablet by mouth every 6 (six) hours as needed.  . [DISCONTINUED] cephALEXin (KEFLEX) 500 MG capsule Take 1 capsule (500 mg total) by mouth 4 (four) times daily.  . [DISCONTINUED] gabapentin (NEURONTIN) 400 MG capsule Take 400 mg by mouth at bedtime.   No facility-administered encounter medications on file as of 07/02/2019.     ALLERGIES:  Allergies  Allergen Reactions  . Cymbalta [Duloxetine Hcl] Other (See Comments)    Hallucinated and couldn't think; got worse and worse the longer she took this.     PHYSICAL EXAM:  ECOG Performance status: 1  Vitals:   07/02/19 0906  BP: 130/82  Pulse: 93  Resp: 18  Temp: (!) 96.9 F (36.1 C)  SpO2: 96%   Filed Weights   07/02/19 0906  Weight: 166 lb (75.3 kg)    Physical Exam Constitutional:      Appearance: Normal appearance. She is normal weight.  Cardiovascular:     Rate and Rhythm: Normal rate and regular rhythm.     Heart sounds: Normal heart sounds.  Pulmonary:     Effort: Pulmonary effort is normal.     Breath sounds: Normal breath sounds.   Abdominal:     General:  Bowel sounds are normal.     Palpations: Abdomen is soft.  Musculoskeletal: Normal range of motion.  Skin:    General: Skin is warm.  Neurological:     Mental Status: She is alert and oriented to person, place, and time. Mental status is at baseline.  Psychiatric:        Mood and Affect: Mood normal.        Behavior: Behavior normal.        Thought Content: Thought content normal.        Judgment: Judgment normal.      LABORATORY DATA:  I have reviewed the labs as listed.  CBC    Component Value Date/Time   WBC 7.8 06/22/2019 1233   RBC 4.75 06/22/2019 1233   HGB 14.1 06/22/2019 1233   HCT 43.9 06/22/2019 1233   PLT 256 06/22/2019 1233   MCV 92.4 06/22/2019 1233   MCH 29.7 06/22/2019 1233   MCHC 32.1 06/22/2019 1233   RDW 11.9 06/22/2019 1233   LYMPHSABS 1.5 06/22/2019 1233   MONOABS 0.5 06/22/2019 1233   EOSABS 0.2 06/22/2019 1233   BASOSABS 0.0 06/22/2019 1233   CMP Latest Ref Rng & Units 06/22/2019 05/20/2019 03/20/2019  Glucose 70 - 99 mg/dL 159(H) 100(H) 113(H)  BUN 8 - 23 mg/dL '23 16 19  '$ Creatinine 0.44 - 1.00 mg/dL 0.92 0.75 0.80  Sodium 135 - 145 mmol/L 138 138 135  Potassium 3.5 - 5.1 mmol/L 4.5 3.4(L) 4.2  Chloride 98 - 111 mmol/L 101 103 102  CO2 22 - 32 mmol/L 27 26 -  Calcium 8.9 - 10.3 mg/dL 9.2 8.9 -  Total Protein 6.5 - 8.1 g/dL 6.9 6.4(L) -  Total Bilirubin 0.3 - 1.2 mg/dL 0.2(L) 0.4 -  Alkaline Phos 38 - 126 U/L 81 68 -  AST 15 - 41 U/L 15 17 -  ALT 0 - 44 U/L 9 12 -    DIAGNOSTIC IMAGING:  I have independently reviewed the mammogram scan and discussed with the patient.  I personally performed a face-to-face visit.  All questions were answered to patient's stated satisfaction. Encouraged patient to call with any new concerns or questions before his next visit to the cancer center and we can certain see him sooner, if needed.     ASSESSMENT & PLAN:   Invasive ductal carcinoma of breast, female, right ([place]) 1.   Stage Ia right breast cancer: - Status post lumpectomy with Dr. Arnoldo Morale on 10/09/2016. - Invasive ductal carcinoma of the right breast, ER/PR positive, HER-2 negative. -Received no radiation. -Started on anastrozole in March 2018.  She did not take anastrozole from 05/17/2017 through 10/15/2017 likely due from intolerance. -She is now taking letrozole 2.5 mg without any major problems.  She denies any hot flashes or musculoskeletal problems. - Physical examination today did not reveal any palpable masses.  Right lumpectomy site in the medial quadrant is within normal limits. -Labs done on 06/22/2019 showed potassium 4.5, creatinine 0.92, WBC 7.8, hemoglobin 14.1, platelets 256. - Her last mammogram was on 06/22/2019 which was BI-RADS Category 3 probably benign.   -We will repeat a diagnostic mammogram in 6 months. -We will follow-up with her in 6 months after the mammogram with labs.   2.  Osteopenia: - Her last DEXA scan was on 11/26/2017 showed a T score of -1.7. -She was started on Prolia every 6 months on 12/19/2016. -Her last Prolia injection was on 07/02/2019 - Labs on 06/22/2019 showed her calcium level 9.2 - She  will proceed with her Prolia injection today      Orders placed this encounter:  Orders Placed This Encounter  Procedures  . MM DIAG BREAST TOMO UNI LEFT  . Lactate dehydrogenase  . CBC with Differential/Platelet  . Comprehensive metabolic panel  . Vitamin B12  . Vitamin D 25 hydroxy      Francene Finders, FNP-C [facility] [phone]

## 2019-07-02 NOTE — Assessment & Plan Note (Addendum)
1.  Stage Ia right breast cancer: - Status post lumpectomy with Dr. Jenkins on 10/09/2016. - Invasive ductal carcinoma of the right breast, ER/PR positive, HER-2 negative. -Received no radiation. -Started on anastrozole in March 2018.  She did not take anastrozole from 05/17/2017 through 10/15/2017 likely due from intolerance. -She is now taking letrozole 2.5 mg without any major problems.  She denies any hot flashes or musculoskeletal problems. - Physical examination today did not reveal any palpable masses.  Right lumpectomy site in the medial quadrant is within normal limits. -Labs done on 06/22/2019 showed potassium 4.5, creatinine 0.92, WBC 7.8, hemoglobin 14.1, platelets 256. - Her last mammogram was on 06/22/2019 which was BI-RADS Category 3 probably benign.   -We will repeat a diagnostic mammogram in 6 months. -We will follow-up with her in 6 months after the mammogram with labs.   2.  Osteopenia: - Her last DEXA scan was on 11/26/2017 showed a T score of -1.7. -She was started on Prolia every 6 months on 12/19/2016. -Her last Prolia injection was on 07/02/2019 - Labs on 06/22/2019 showed her calcium level 9.2 - She will proceed with her Prolia injection today 

## 2019-07-02 NOTE — Progress Notes (Signed)
Pt seen in clinic today by Francene Finders NP prior to injection. Pt here today for Prolia injection. Injection was last given on 12/08/2018. Calcium on 06/22/19 was 9.2. Pt states that she has not had any recent dental extractions or root canals nor does she have any scheduled. Pt was given Prolia injection in. Pt tolerated injection well with no complaints. Pt stable and discharged home via wheelchair. Pt to return in 6 months for next Prolia injection.

## 2019-07-07 ENCOUNTER — Ambulatory Visit: Payer: Medicare HMO | Admitting: Orthopedic Surgery

## 2019-07-12 ENCOUNTER — Telehealth (HOSPITAL_COMMUNITY): Payer: Self-pay | Admitting: *Deleted

## 2019-07-12 NOTE — Telephone Encounter (Signed)
Opened in Error.

## 2019-07-13 DIAGNOSIS — R5381 Other malaise: Secondary | ICD-10-CM | POA: Diagnosis not present

## 2019-07-13 DIAGNOSIS — R69 Illness, unspecified: Secondary | ICD-10-CM | POA: Diagnosis not present

## 2019-07-20 ENCOUNTER — Other Ambulatory Visit: Payer: Self-pay | Admitting: Nurse Practitioner

## 2019-07-20 DIAGNOSIS — I4891 Unspecified atrial fibrillation: Secondary | ICD-10-CM

## 2019-07-20 DIAGNOSIS — I639 Cerebral infarction, unspecified: Secondary | ICD-10-CM

## 2019-07-30 ENCOUNTER — Other Ambulatory Visit: Payer: Self-pay

## 2019-07-30 ENCOUNTER — Emergency Department (HOSPITAL_COMMUNITY)
Admission: EM | Admit: 2019-07-30 | Discharge: 2019-07-30 | Disposition: A | Discharge status: Other Acute Inpatient Hospital | Payer: Medicare HMO

## 2019-08-02 ENCOUNTER — Ambulatory Visit (HOSPITAL_COMMUNITY): Payer: Medicare HMO | Admitting: Psychiatry

## 2019-08-04 ENCOUNTER — Telehealth (HOSPITAL_COMMUNITY): Payer: Self-pay | Admitting: *Deleted

## 2019-08-04 NOTE — Telephone Encounter (Signed)
Pt is increasingly coinfused in the evenings, probably developing dementia, suggested complete physical with PCP to r/o organic causes

## 2019-08-04 NOTE — Telephone Encounter (Signed)
PATIENT'S SON CALLED  HE WOULD LIKE TO SPEAK WITH YOU PRIOR TO HIS MOM'S UPCOMING APPT Jones,[facility]) [phone] (M)

## 2019-08-09 ENCOUNTER — Ambulatory Visit: Payer: Medicare HMO | Admitting: Adult Health

## 2019-08-09 ENCOUNTER — Telehealth: Payer: Self-pay

## 2019-08-09 NOTE — Telephone Encounter (Signed)
Called to r/s or convert pts appt. NP JM is ooo pt opt to r/s on 08/31/19 @ 8:45.

## 2019-08-13 ENCOUNTER — Ambulatory Visit: Payer: Medicare HMO | Admitting: Psychiatry

## 2019-08-16 ENCOUNTER — Emergency Department (HOSPITAL_COMMUNITY): Payer: Medicare HMO

## 2019-08-16 ENCOUNTER — Other Ambulatory Visit: Payer: Self-pay

## 2019-08-16 ENCOUNTER — Inpatient Hospital Stay (HOSPITAL_COMMUNITY)
Admission: EM | Admit: 2019-08-16 | Discharge: 2019-08-19 | DRG: 689 | Disposition: A | Discharge status: Home Health | Payer: Medicare HMO | Attending: Internal Medicine | Admitting: Internal Medicine

## 2019-08-16 ENCOUNTER — Encounter (HOSPITAL_COMMUNITY): Payer: Self-pay

## 2019-08-16 DIAGNOSIS — I5022 Chronic systolic (congestive) heart failure: Secondary | ICD-10-CM | POA: Diagnosis not present

## 2019-08-16 DIAGNOSIS — G9340 Encephalopathy, unspecified: Secondary | ICD-10-CM | POA: Diagnosis present

## 2019-08-16 DIAGNOSIS — Z7982 Long term (current) use of aspirin: Secondary | ICD-10-CM

## 2019-08-16 DIAGNOSIS — Z8673 Personal history of transient ischemic attack (TIA), and cerebral infarction without residual deficits: Secondary | ICD-10-CM | POA: Diagnosis not present

## 2019-08-16 DIAGNOSIS — F424 Excoriation (skin-picking) disorder: Secondary | ICD-10-CM | POA: Diagnosis present

## 2019-08-16 DIAGNOSIS — Z818 Family history of other mental and behavioral disorders: Secondary | ICD-10-CM

## 2019-08-16 DIAGNOSIS — R1310 Dysphagia, unspecified: Secondary | ICD-10-CM | POA: Diagnosis present

## 2019-08-16 DIAGNOSIS — C50919 Malignant neoplasm of unspecified site of unspecified female breast: Secondary | ICD-10-CM | POA: Diagnosis present

## 2019-08-16 DIAGNOSIS — Z79811 Long term (current) use of aromatase inhibitors: Secondary | ICD-10-CM

## 2019-08-16 DIAGNOSIS — G9341 Metabolic encephalopathy: Secondary | ICD-10-CM | POA: Diagnosis present

## 2019-08-16 DIAGNOSIS — E861 Hypovolemia: Secondary | ICD-10-CM | POA: Diagnosis present

## 2019-08-16 DIAGNOSIS — Z79899 Other long term (current) drug therapy: Secondary | ICD-10-CM

## 2019-08-16 DIAGNOSIS — N289 Disorder of kidney and ureter, unspecified: Secondary | ICD-10-CM | POA: Diagnosis not present

## 2019-08-16 DIAGNOSIS — N179 Acute kidney failure, unspecified: Secondary | ICD-10-CM | POA: Diagnosis present

## 2019-08-16 DIAGNOSIS — Z888 Allergy status to other drugs, medicaments and biological substances status: Secondary | ICD-10-CM

## 2019-08-16 DIAGNOSIS — Z86010 Personal history of colonic polyps: Secondary | ICD-10-CM

## 2019-08-16 DIAGNOSIS — B9620 Unspecified Escherichia coli [E. coli] as the cause of diseases classified elsewhere: Secondary | ICD-10-CM | POA: Diagnosis present

## 2019-08-16 DIAGNOSIS — M1990 Unspecified osteoarthritis, unspecified site: Secondary | ICD-10-CM | POA: Diagnosis present

## 2019-08-16 DIAGNOSIS — Z96651 Presence of right artificial knee joint: Secondary | ICD-10-CM | POA: Diagnosis present

## 2019-08-16 DIAGNOSIS — Z03818 Encounter for observation for suspected exposure to other biological agents ruled out: Secondary | ICD-10-CM | POA: Diagnosis not present

## 2019-08-16 DIAGNOSIS — M8580 Other specified disorders of bone density and structure, unspecified site: Secondary | ICD-10-CM | POA: Diagnosis present

## 2019-08-16 DIAGNOSIS — Z791 Long term (current) use of non-steroidal anti-inflammatories (NSAID): Secondary | ICD-10-CM

## 2019-08-16 DIAGNOSIS — E1149 Type 2 diabetes mellitus with other diabetic neurological complication: Secondary | ICD-10-CM | POA: Diagnosis not present

## 2019-08-16 DIAGNOSIS — Z7984 Long term (current) use of oral hypoglycemic drugs: Secondary | ICD-10-CM

## 2019-08-16 DIAGNOSIS — R9431 Abnormal electrocardiogram [ECG] [EKG]: Secondary | ICD-10-CM | POA: Diagnosis not present

## 2019-08-16 DIAGNOSIS — F418 Other specified anxiety disorders: Secondary | ICD-10-CM | POA: Diagnosis present

## 2019-08-16 DIAGNOSIS — E7800 Pure hypercholesterolemia, unspecified: Secondary | ICD-10-CM | POA: Diagnosis present

## 2019-08-16 DIAGNOSIS — Z811 Family history of alcohol abuse and dependence: Secondary | ICD-10-CM

## 2019-08-16 DIAGNOSIS — N390 Urinary tract infection, site not specified: Secondary | ICD-10-CM | POA: Diagnosis not present

## 2019-08-16 DIAGNOSIS — R4182 Altered mental status, unspecified: Secondary | ICD-10-CM

## 2019-08-16 DIAGNOSIS — R69 Illness, unspecified: Secondary | ICD-10-CM | POA: Diagnosis not present

## 2019-08-16 DIAGNOSIS — C50911 Malignant neoplasm of unspecified site of right female breast: Secondary | ICD-10-CM | POA: Diagnosis present

## 2019-08-16 DIAGNOSIS — E860 Dehydration: Secondary | ICD-10-CM

## 2019-08-16 DIAGNOSIS — R5381 Other malaise: Secondary | ICD-10-CM

## 2019-08-16 DIAGNOSIS — E1140 Type 2 diabetes mellitus with diabetic neuropathy, unspecified: Secondary | ICD-10-CM | POA: Diagnosis present

## 2019-08-16 DIAGNOSIS — Z20822 Contact with and (suspected) exposure to covid-19: Secondary | ICD-10-CM | POA: Diagnosis present

## 2019-08-16 DIAGNOSIS — Z87891 Personal history of nicotine dependence: Secondary | ICD-10-CM

## 2019-08-16 DIAGNOSIS — F330 Major depressive disorder, recurrent, mild: Secondary | ICD-10-CM

## 2019-08-16 DIAGNOSIS — E538 Deficiency of other specified B group vitamins: Secondary | ICD-10-CM

## 2019-08-16 DIAGNOSIS — E876 Hypokalemia: Secondary | ICD-10-CM | POA: Diagnosis present

## 2019-08-16 DIAGNOSIS — E1151 Type 2 diabetes mellitus with diabetic peripheral angiopathy without gangrene: Secondary | ICD-10-CM | POA: Diagnosis present

## 2019-08-16 LAB — CBC
HCT: 42.2 % (ref 36.0–46.0)
Hemoglobin: 13.9 g/dL (ref 12.0–15.0)
MCH: 29.8 pg (ref 26.0–34.0)
MCHC: 32.9 g/dL (ref 30.0–36.0)
MCV: 90.4 fL (ref 80.0–100.0)
Platelets: 239 10*3/uL (ref 150–400)
RBC: 4.67 MIL/uL (ref 3.87–5.11)
RDW: 13.4 % (ref 11.5–15.5)
WBC: 7.8 10*3/uL (ref 4.0–10.5)
nRBC: 0 % (ref 0.0–0.2)

## 2019-08-16 LAB — COMPREHENSIVE METABOLIC PANEL
ALT: 11 U/L (ref 0–44)
AST: 16 U/L (ref 15–41)
Albumin: 4 g/dL (ref 3.5–5.0)
Alkaline Phosphatase: 55 U/L (ref 38–126)
Anion gap: 10 (ref 5–15)
BUN: 25 mg/dL — ABNORMAL HIGH (ref 8–23)
CO2: 29 mmol/L (ref 22–32)
Calcium: 9.5 mg/dL (ref 8.9–10.3)
Chloride: 101 mmol/L (ref 98–111)
Creatinine, Ser: 1.03 mg/dL — ABNORMAL HIGH (ref 0.44–1.00)
GFR calc Af Amer: 59 mL/min — ABNORMAL LOW (ref >60)
GFR calc non Af Amer: 51 mL/min — ABNORMAL LOW (ref >60)
Glucose, Bld: 103 mg/dL — ABNORMAL HIGH (ref 70–99)
Potassium: 3.5 mmol/L (ref 3.5–5.1)
Sodium: 140 mmol/L (ref 135–145)
Total Bilirubin: 0.6 mg/dL (ref 0.3–1.2)
Total Protein: 6.8 g/dL (ref 6.5–8.1)

## 2019-08-16 LAB — APTT: aPTT: 23 seconds — ABNORMAL LOW (ref 24–36)

## 2019-08-16 LAB — DIFFERENTIAL
Abs Immature Granulocytes: 0.02 10*3/uL (ref 0.00–0.07)
Basophils Absolute: 0 10*3/uL (ref 0.0–0.1)
Basophils Relative: 0 %
Eosinophils Absolute: 0.3 10*3/uL (ref 0.0–0.5)
Eosinophils Relative: 4 %
Immature Granulocytes: 0 %
Lymphocytes Relative: 18 %
Lymphs Abs: 1.4 10*3/uL (ref 0.7–4.0)
Monocytes Absolute: 0.7 10*3/uL (ref 0.1–1.0)
Monocytes Relative: 9 %
Neutro Abs: 5.4 10*3/uL (ref 1.7–7.7)
Neutrophils Relative %: 69 %

## 2019-08-16 LAB — PROTIME-INR
INR: 1 (ref 0.8–1.2)
Prothrombin Time: 13.4 seconds (ref 11.4–15.2)

## 2019-08-16 LAB — RESPIRATORY PANEL BY RT PCR (FLU A&B, COVID)
Influenza A by PCR: NEGATIVE
Influenza B by PCR: NEGATIVE
SARS Coronavirus 2 by RT PCR: NEGATIVE

## 2019-08-16 LAB — ETHANOL: Alcohol, Ethyl (B): <10 mg/dL (ref <10)

## 2019-08-16 LAB — CBG MONITORING, ED: Glucose-Capillary: 92 mg/dL (ref 70–99)

## 2019-08-16 MED ORDER — INSULIN ASPART 100 UNIT/ML ~~LOC~~ SOLN
0.0000 [IU] | Freq: Three times a day (TID) | SUBCUTANEOUS | Status: DC
  Administered 2019-08-17: 1 [IU] via SUBCUTANEOUS
  Administered 2019-08-18: 2 [IU] via SUBCUTANEOUS
  Administered 2019-08-18: 1 [IU] via SUBCUTANEOUS
  Administered 2019-08-18: 3 [IU] via SUBCUTANEOUS
  Administered 2019-08-19: 9 [IU] via SUBCUTANEOUS
  Administered 2019-08-19: 2 [IU] via SUBCUTANEOUS

## 2019-08-16 MED ORDER — SODIUM CHLORIDE 0.9 % IV SOLN: INTRAVENOUS | Status: AC

## 2019-08-16 MED ORDER — SODIUM CHLORIDE 0.9 % IV BOLUS
500.0000 mL | Freq: Once | INTRAVENOUS | Status: AC
  Administered 2019-08-16: 500 mL via INTRAVENOUS

## 2019-08-16 MED ORDER — INSULIN ASPART 100 UNIT/ML ~~LOC~~ SOLN: 0.0000 [IU] | Freq: Every day | SUBCUTANEOUS | Status: DC

## 2019-08-16 NOTE — ED Notes (Signed)
Pt transported to CT ?

## 2019-08-16 NOTE — ED Provider Notes (Signed)
[facility] EMERGENCY DEPARTMENT Provider Note   CSN: [id number] Arrival date & time: 08/16/19  1807     History Chief Complaint  Patient presents with  . Altered Mental Status    Amy [name] is a 84 y.o. female.  HPI   This patient is an 84 year old female, known history of diabetes, high cholesterol, breast cancer, stroke and in fact had presented to the hospital in August 2020 with acute stroke symptoms after having a fall.  She was noted to have a small area of an acute or subacute nonhemorrhagic infarct in the left precentral gyrus which correlated with her right upper extremity weakness.  There is also a small area of left parietal lobe infarct as well.  She currently lives by herself, the majority of the history is obtained from the son as the patient is confused and a level 5 caveat applies.  He reports to the nursing staff that yesterday he had called her and she was asking where her father was, she seemed confused and disoriented, today it was no better and she was having difficulty answering any questions with answers that did not correlate with the questions that were being asked.  She was brought in for this reason.  The patient does note that her words are not coming out correctly.  She denies any focal weakness or numbness, does not have a headache, denies any changes in vision.  Past Medical History:  Diagnosis Date  . Anxiety   . Arthritis   . Back pain   . Colon polyps   . Depression   . Diabetes mellitus, type II ([place])    diet controlled  . Hypercholesterolemia   . Invasive ductal carcinoma of breast, female, right ([place])   . Osteopenia 11/26/2016  . Skin-picking disorder    has 3 open wounds on right wrist that are being treated. About a quarter in size.  . Stroke [facility])     Patient Active Problem List   Diagnosis Date Noted  . Acute encephalopathy 08/16/2019  . Cardiac LV ejection fraction 30-35% 03/29/2019  . Dyslipidemia associated with type 2  diabetes mellitus ([place]) 03/29/2019  . Major depression with psychotic features ([place]) 03/29/2019  . Acute ischemic left MCA stroke ([place]) 03/20/2019  . Edema of both lower legs due to peripheral venous insufficiency 10/12/2018  . Anemia 05/12/2018  . S/P total knee replacement, right 05/05/18 05/12/2018  . Type 2 diabetes mellitus with neurological complications ([place]) [id number]  . Primary osteoarthritis of right knee   . Status post surgery 01/26/2018  . Surgery, elective 01/26/2018  . Degenerative spondylolisthesis 01/26/2018  . Osteoporosis 12/13/2016  . Osteopenia 11/26/2016  . Invasive ductal carcinoma of breast, female, right ([place])   . Lumbago 03/22/2014  . Stiffness of joint, not elsewhere classified, pelvic region and thigh 03/22/2014  . Muscle weakness (generalized) 03/22/2014  . Abnormality of gait 03/22/2014  . Depression with anxiety 09/03/2012  . Insomnia secondary to depression with anxiety 09/03/2012  . Pain 09/03/2012    Past Surgical History:  Procedure Laterality Date  . APPENDECTOMY    . BACK SURGERY    . BREAST BIOPSY Right 10/09/2016   Procedure: BREAST BIOPSY WITH NEEDLE LOCALIZATION;  Surgeon: [name], MD;  Location: [facility] ORS;  Service: General;  Laterality: Right;  . BREAST SURGERY Right   . BUBBLE STUDY  03/23/2019   Procedure: BUBBLE STUDY;  Surgeon: [name], MD;  Location: [facility] ENDOSCOPY;  Service: Cardiovascular;;  . BUNIONECTOMY Bilateral   .  FACIAL COSMETIC SURGERY     drooping eyelid and brow  . HERNIA REPAIR Right    Inguinal x2  . KNEE ARTHROSCOPY Right   . SHOULDER SURGERY Right    rotator cuff repair and removal of bone spur  . TEE WITHOUT CARDIOVERSION N/A 03/23/2019   Procedure: TRANSESOPHAGEAL ECHOCARDIOGRAM (TEE);  Surgeon: [name], MD;  Location: [facility] ENDOSCOPY;  Service: Cardiovascular;  Laterality: N/A;  . TEMPOROMANDIBULAR JOINT SURGERY Right   . TOTAL KNEE ARTHROPLASTY Right 05/05/2018   Procedure: TOTAL KNEE  ARTHROPLASTY;  Surgeon: [name], MD;  Location: [facility] ORS;  Service: Orthopedics;  Laterality: Right;     OB History   No obstetric history on file.     Family History  Problem Relation Age of Onset  . Anxiety disorder Mother   . Depression Mother   . Depression Maternal Grandmother   . Alcohol abuse Brother   . Cancer - Other Sister   . ADD / ADHD Neg Hx   . Bipolar disorder Neg Hx   . Dementia Neg Hx   . Drug abuse Neg Hx   . OCD Neg Hx   . Paranoid behavior Neg Hx   . Schizophrenia Neg Hx   . Seizures Neg Hx   . Sexual abuse Neg Hx   . Physical abuse Neg Hx     Social History   Tobacco Use  . Smoking status: Former Smoker    Packs/day: 0.50    Years: 40.00    Pack years: 20.00    Types: Cigarettes    Quit date: 09/04/1995    Years since quitting: 23.9  . Smokeless tobacco: Never Used  Substance Use Topics  . Alcohol use: No  . Drug use: No    Types: Benzodiazepines, Hydrocodone    Home Medications Prior to Admission medications   Medication Sig Start Date End Date Taking? Authorizing Provider  ALPRAZolam Duanne Moron) 0.5 MG tablet Take 1 tablet (0.5 mg total) by mouth daily as needed for anxiety. 06/17/19  Yes Cloria Spring, MD  aspirin EC 81 MG tablet Take 81 mg by mouth every morning.   Yes [provider]  calcium carbonate (OSCAL) 1500 (600 Ca) MG TABS tablet Take 600 mg of elemental calcium by mouth daily with supper.    Yes [provider]  carvedilol (COREG) 3.125 MG tablet Take 1 tablet (3.125 mg total) by mouth 2 (two) times daily. 05/19/19 08/17/19 Yes TillerySatira Mccallum, PA-C  cholecalciferol (VITAMIN D) 1000 units tablet Take 1 tablet (1,000 Units total) by mouth daily. 10/23/16  Yes Kefalas, Manon Hilding, PA-C  escitalopram (LEXAPRO) 20 MG tablet Take 1 tablet (20 mg total) by mouth daily. 06/17/19  Yes Cloria Spring, MD  gabapentin (NEURONTIN) 300 MG capsule Take 1 capsule (300 mg total) by mouth at bedtime. 06/17/19  Yes  Cloria Spring, MD  ibuprofen (ADVIL) 800 MG tablet TAKE 1 TABLET(800 MG) BY MOUTH EVERY 8 HOURS AS NEEDED Patient taking differently: Take 800 mg by mouth every 8 (eight) hours as needed for mild pain or moderate pain.  05/17/19  Yes [name], MD  letrozole [facility]) 2.5 MG tablet TAKE 1 TABLET(2.5 MG) BY MOUTH DAILY Patient taking differently: Take 2.5 mg by mouth daily.  07/02/19  Yes Lockamy, Randi L, NP-C  losartan (COZAAR) 25 MG tablet Take 0.5 tablets (12.5 mg total) by mouth at bedtime. 05/19/19 08/17/19 Yes TillerySatira Mccallum, PA-C  metFORMIN (GLUCOPHAGE) 500 MG tablet Take 1 tablet (  500 mg total) by mouth 2 (two) times daily with a meal. 04/09/19  Yes Gerlene Fee, NP  risperiDONE (RISPERDAL) 1 MG tablet Take 1 tablet (1 mg total) by mouth at bedtime. 06/17/19  Yes Cloria Spring, MD  traZODone (DESYREL) 50 MG tablet TAKE 1 TABLET(50 MG) BY MOUTH AT BEDTIME Patient taking differently: Take 50 mg by mouth at bedtime.  06/17/19  Yes Cloria Spring, MD  venlafaxine XR (EFFEXOR-XR) 150 MG 24 hr capsule TAKE 1 CAPSULE(150 MG) BY MOUTH TWICE DAILY Patient taking differently: Take 150 mg by mouth 2 (two) times daily.  06/17/19  Yes Cloria Spring, MD  atorvastatin (LIPITOR) 40 MG tablet Take 1 tablet (40 mg total) by mouth daily at 6 PM. 04/09/19 07/02/19  Nyoka Cowden, Phylis Bougie, NP    Allergies    Cymbalta [duloxetine hcl]  Review of Systems   Review of Systems  Unable to perform ROS: Mental status change    Physical Exam Updated Vital Signs BP (!) 142/55   Pulse 79   Temp (!) 97.5 F (36.4 C) (Oral)   Resp 14   Ht 1.6 m (5\' 3" )   Wt 76 kg   SpO2 94%   BMI 29.68 kg/m   Physical Exam Vitals and nursing note reviewed.  Constitutional:      General: She is not in acute distress.    Appearance: She is well-developed.  HENT:     Head: Normocephalic and atraumatic.     Mouth/Throat:     Pharynx: No oropharyngeal exudate.  Eyes:     General: No scleral icterus.        Right eye: No discharge.        Left eye: No discharge.     Conjunctiva/sclera: Conjunctivae normal.     Pupils: Pupils are equal, round, and reactive to light.  Neck:     Thyroid: No thyromegaly.     Vascular: No JVD.  Cardiovascular:     Rate and Rhythm: Normal rate and regular rhythm.     Heart sounds: Normal heart sounds. No murmur. No friction rub. No gallop.   Pulmonary:     Effort: Pulmonary effort is normal. No respiratory distress.     Breath sounds: Normal breath sounds. No wheezing or rales.  Abdominal:     General: Bowel sounds are normal. There is no distension.     Palpations: Abdomen is soft. There is no mass.     Tenderness: There is no abdominal tenderness.  Musculoskeletal:        General: No tenderness. Normal range of motion.     Cervical back: Normal range of motion and neck supple.  Lymphadenopathy:     Cervical: No cervical adenopathy.  Skin:    General: Skin is warm and dry.     Findings: No erythema or rash.  Neurological:     Mental Status: She is alert.     Coordination: Coordination normal.     Comments: The patient has no facial droop, she follows commands without difficulty, she can do finger-nose-finger, she has normal strength in all 4 extremities, she is able to straight leg raise both legs.  She has normal grips, normal finger-nose-finger, her speech is clear but sometimes has difficulty answering questions.  If the questions are very specific such as where were you born, what is this item, she answers the questions 100% correctly.  When she is just talking she has difficulty making sense and states that yesterday there were people  that were coming over to her house to monitor her, she tried to slip out the back door and go to the neighbor's house.  She cannot remember the neighbors name or the exact circumstances or why the people were at her house  Psychiatric:        Behavior: Behavior normal.     ED Results / Procedures / Treatments    Labs (all labs ordered are listed, but only abnormal results are displayed) Labs Reviewed  APTT - Abnormal; Notable for the following components:      Result Value   aPTT 23 (*)    All other components within normal limits  COMPREHENSIVE METABOLIC PANEL - Abnormal; Notable for the following components:   Glucose, Bld 103 (*)    BUN 25 (*)    Creatinine, Ser 1.03 (*)    GFR calc non Af Amer 51 (*)    GFR calc Af Amer 59 (*)    All other components within normal limits  RESPIRATORY PANEL BY RT PCR (FLU A&B, COVID)  ETHANOL  PROTIME-INR  CBC  DIFFERENTIAL  RAPID URINE DRUG SCREEN, HOSP PERFORMED  URINALYSIS, ROUTINE W REFLEX MICROSCOPIC  CBG MONITORING, ED  I-STAT CHEM 8, ED    EKG EKG Interpretation  Date/Time:  Monday August 16 2019 18:19:47 EST Ventricular Rate:  82 PR Interval:    QRS Duration: 83 QT Interval:  419 QTC Calculation: 490 R Axis:   9 Text Interpretation: Sinus rhythm Abnormal R-wave progression, early transition Borderline prolonged QT interval since last tracing no significant change Confirmed by Noemi Chapel [phone]) on 08/16/2019 9:41:57 PM   Radiology CT HEAD WO CONTRAST  Result Date: 08/16/2019 CLINICAL DATA:  Encephalopathy. Additional history provided: Confusion EXAM: CT HEAD WITHOUT CONTRAST TECHNIQUE: Contiguous axial images were obtained from the base of the skull through the vertex without intravenous contrast. COMPARISON:  Brain MRI 03/21/2019, CT angiogram head/neck 03/21/2019 FINDINGS: Brain: No evidence of acute intracranial hemorrhage. No demarcated cortical infarction. No evidence of intracranial mass. No midline shift or extra-axial fluid collection. Redemonstrated chronic infarcts within the left cerebral white matter. Background advanced chronic small vessel ischemic disease, also similar to prior exam. Mild generalized parenchymal atrophy. Vascular: No hyperdense vessel.  Atherosclerotic calcifications. Skull: Normal. Negative for  fracture or focal lesion. Sinuses/Orbits: Visualized orbits demonstrate no acute abnormality. No significant paranasal sinus disease or mastoid effusion at the imaged levels. IMPRESSION: No CT evidence of acute intracranial abnormality. Generalized parenchymal atrophy with advanced chronic small vessel ischemic disease. Redemonstrated chronic infarcts within the left cerebral white matter. Electronically Signed   By: Kellie Simmering DO   On: 08/16/2019 19:07    Procedures Procedures (including critical care time)  Medications Ordered in ED Medications  sodium chloride 0.9 % bolus 500 mL (500 mLs Intravenous New Bag/Given 08/16/19 2155)    ED Course  I have reviewed the triage vital signs and the nursing notes.  Pertinent labs & imaging results that were available during my care of the patient were reviewed by me and considered in my medical decision making (see chart for details).    MDM Rules/Calculators/A&P                      The patient appears more confused and altered than she does focal.  Given her history she will need a CT scan of the brain and some labs including a urinalysis.  Her EKG was unremarkable.    11:03 PM Cardiac monitoring reveals normal sinus  rhythm, rate of approximately 80 (Rate & rhythm), as reviewed and interpreted by me. Cardiac monitoring was ordered due to altered mental status and to monitor patient for dysrhythmia.   The patient has no acute findings on the CT scan or the lab work.  She has no urine in her bladder and has oliguria.  She had in and out catheterization which was dry and a bladder scan which proved it.  The vital signs have been overall unremarkable, I discussed the case with Dr. Myna Hidalgo with the hospital service who is been agreeable to admit the patient.  GREER AUGUSTA was evaluated in Emergency Department on 08/16/2019 for the symptoms described in the history of present illness. She was evaluated in the context of the global COVID-19  pandemic, which necessitated consideration that the patient might be at risk for infection with the SARS-CoV-2 virus that causes COVID-19. Institutional protocols and algorithms that pertain to the evaluation of patients at risk for COVID-19 are in a state of rapid change based on information released by regulatory bodies including the CDC and federal and state organizations. These policies and algorithms were followed during the patient's care in the ED.   Final Clinical Impression(s) / ED Diagnoses Final diagnoses:  Altered mental status, unspecified altered mental status type  Dehydration      Noemi Chapel, MD 08/16/19 2303

## 2019-08-16 NOTE — ED Notes (Addendum)
Attempted in and out cath without success.

## 2019-08-16 NOTE — H&P (Addendum)
History and Physical    Amy [name] [id number] DOB: 1935/12/24 DOA: 08/16/2019  PCP: Patient, No Pcp Per   Patient coming from: Home   Chief Complaint: Confusion   HPI: Amy [name] is a 84 y.o. female with medical history significant for type 2 diabetes mellitus, breast cancer status postmastectomy now on letrozole, depression, anxiety, chronic systolic CHF, and history of CVAs, now presenting to the emergency department for evaluation of confusion.  Patient's son reports that patient seemed to be confused 2 days ago, seemed to be in her usual state yesterday, but then today was noted to be confused again and had some difficulty with her speech.  Patient acknowledges feeling confused over the past day or so, denies any headache, denies any focal numbness or weakness, and denies any residual deficits from her prior strokes.  She is able to give the name of the city she is in, but not the name of the hospital, unable to say what year it is, and unable to give her birthdate or year.  She denies any fevers, chills, cough, or shortness of breath.  She denies headache or change in vision or hearing.  There has not been any recent fall or trauma reported and she is not known to use alcohol or illicit substances.  ED Course: Upon arrival to the ED, patient is found to be afebrile, saturating mid 80s on room air, and with stable blood pressure.  EKG features sinus rhythm with early R transition.  Noncontrast head CT is negative for acute intracranial abnormality but notable for advanced chronic small vessel ischemic disease and chronic infarctions.  Chemistry panel features a creatinine 1.03, up from 0.92 in November 2020.  CBC is unremarkable.  Patient was given IV fluids in the ED, Covid PCR screening test was negative, urinalysis ordered but not yet collected due to empty bladder on bladder scan.  Hospitalists were consulted for admission.  Review of Systems:  All other systems reviewed and  apart from HPI, are negative.  Past Medical History:  Diagnosis Date  . Anxiety   . Arthritis   . Back pain   . Colon polyps   . Depression   . Diabetes mellitus, type II ([place])    diet controlled  . Hypercholesterolemia   . Invasive ductal carcinoma of breast, female, right ([place])   . Osteopenia 11/26/2016  . Skin-picking disorder    has 3 open wounds on right wrist that are being treated. About a quarter in size.  . Stroke [facility])     Past Surgical History:  Procedure Laterality Date  . APPENDECTOMY    . BACK SURGERY    . BREAST BIOPSY Right 10/09/2016   Procedure: BREAST BIOPSY WITH NEEDLE LOCALIZATION;  Surgeon: [name], MD;  Location: [facility]S;  Service: General;  Laterality: Right;  . BREAST SURGERY Right   . BUBBLE STUDY  03/23/2019   Procedure: BUBBLE STUDY;  Surgeon: [name], MD;  Location: [facility] ENDOSCOPY;  Service: Cardiovascular;;  . BUNIONECTOMY Bilateral   . FACIAL COSMETIC SURGERY     drooping eyelid and brow  . HERNIA REPAIR Right    Inguinal x2  . KNEE ARTHROSCOPY Right   . SHOULDER SURGERY Right    rotator cuff repair and removal of bone spur  . TEE WITHOUT CARDIOVERSION N/A 03/23/2019   Procedure: TRANSESOPHAGEAL ECHOCARDIOGRAM (TEE);  Surgeon: [name], MD;  Location: [facility] ENDOSCOPY;  Service: Cardiovascular;  Laterality: N/A;  . TEMPOROMANDIBULAR JOINT SURGERY Right   .  TOTAL KNEE ARTHROPLASTY Right 05/05/2018   Procedure: TOTAL KNEE ARTHROPLASTY;  Surgeon: [name], MD;  Location: [facility] ORS;  Service: Orthopedics;  Laterality: Right;     reports that she quit smoking about 23 years ago. Her smoking use included cigarettes. She has a 20.00 pack-year smoking history. She has never used smokeless tobacco. She reports that she does not drink alcohol or use drugs.  Allergies  Allergen Reactions  . Cymbalta [Duloxetine Hcl] Other (See Comments)    Hallucinated and couldn't think; got worse and worse the longer she took this.     Family History  Problem Relation Age of Onset  . Anxiety disorder Mother   . Depression Mother   . Depression Maternal Grandmother   . Alcohol abuse Brother   . Cancer - Other Sister   . ADD / ADHD Neg Hx   . Bipolar disorder Neg Hx   . Dementia Neg Hx   . Drug abuse Neg Hx   . OCD Neg Hx   . Paranoid behavior Neg Hx   . Schizophrenia Neg Hx   . Seizures Neg Hx   . Sexual abuse Neg Hx   . Physical abuse Neg Hx      Prior to Admission medications   Medication Sig Start Date End Date Taking? Authorizing Provider  ALPRAZolam Duanne Moron) 0.5 MG tablet Take 1 tablet (0.5 mg total) by mouth daily as needed for anxiety. 06/17/19  Yes Cloria Spring, MD  aspirin EC 81 MG tablet Take 81 mg by mouth every morning.   Yes [provider]  calcium carbonate (OSCAL) 1500 (600 Ca) MG TABS tablet Take 600 mg of elemental calcium by mouth daily with supper.    Yes [provider]  carvedilol (COREG) 3.125 MG tablet Take 1 tablet (3.125 mg total) by mouth 2 (two) times daily. 05/19/19 08/17/19 Yes TillerySatira Mccallum, PA-C  cholecalciferol (VITAMIN D) 1000 units tablet Take 1 tablet (1,000 Units total) by mouth daily. 10/23/16  Yes Kefalas, Manon Hilding, PA-C  escitalopram (LEXAPRO) 20 MG tablet Take 1 tablet (20 mg total) by mouth daily. 06/17/19  Yes Cloria Spring, MD  gabapentin (NEURONTIN) 300 MG capsule Take 1 capsule (300 mg total) by mouth at bedtime. 06/17/19  Yes Cloria Spring, MD  ibuprofen (ADVIL) 800 MG tablet TAKE 1 TABLET(800 MG) BY MOUTH EVERY 8 HOURS AS NEEDED Patient taking differently: Take 800 mg by mouth every 8 (eight) hours as needed for mild pain or moderate pain.  05/17/19  Yes [name], MD  letrozole [facility]) 2.5 MG tablet TAKE 1 TABLET(2.5 MG) BY MOUTH DAILY Patient taking differently: Take 2.5 mg by mouth daily.  07/02/19  Yes Lockamy, Randi L, NP-C  losartan (COZAAR) 25 MG tablet Take 0.5 tablets (12.5 mg total) by mouth at bedtime. 05/19/19  08/17/19 Yes TillerySatira Mccallum, PA-C  metFORMIN (GLUCOPHAGE) 500 MG tablet Take 1 tablet (500 mg total) by mouth 2 (two) times daily with a meal. 04/09/19  Yes Gerlene Fee, NP  risperiDONE (RISPERDAL) 1 MG tablet Take 1 tablet (1 mg total) by mouth at bedtime. 06/17/19  Yes Cloria Spring, MD  traZODone (DESYREL) 50 MG tablet TAKE 1 TABLET(50 MG) BY MOUTH AT BEDTIME Patient taking differently: Take 50 mg by mouth at bedtime.  06/17/19  Yes Cloria Spring, MD  venlafaxine XR (EFFEXOR-XR) 150 MG 24 hr capsule TAKE 1 CAPSULE(150 MG) BY MOUTH TWICE DAILY Patient taking differently: Take 150 mg by mouth 2 (  two) times daily.  06/17/19  Yes Cloria Spring, MD  atorvastatin (LIPITOR) 40 MG tablet Take 1 tablet (40 mg total) by mouth daily at 6 PM. 04/09/19 07/02/19  Gerlene Fee, NP    Physical Exam: Vitals:   08/16/19 2130 08/16/19 2200 08/16/19 2230 08/16/19 2300  BP: 138/74 (!) 137/54 (!) 142/55 136/61  Pulse: 84 80 79 80  Resp: (!) 21 18 14 19   Temp:      TempSrc:      SpO2: 93% 93% 94% 95%  Weight:      Height:        Constitutional: NAD, calm  Eyes: PERTLA, lids and conjunctivae normal ENMT: Mucous membranes are moist. Posterior pharynx clear of any exudate or lesions.   Neck: normal, supple, no masses, no thyromegaly Respiratory: clear to auscultation bilaterally, no wheezing, no crackles. No accessory muscle use.  Cardiovascular: S1 & S2 heard, regular rate and rhythm. No extremity edema.   Abdomen: No distension, no tenderness, soft. Bowel sounds active.  Musculoskeletal: no clubbing / cyanosis. No joint deformity upper and lower extremities.   Skin: no significant rashes, lesions, ulcers. Warm, dry, well-perfused. Neurologic: CN 2-12 grossly intact. Stuttering speech. Sensation intact. Right grip strength 4/5. Strength is otherwise 5/5 in all 4 limbs.  Psychiatric: Alert, oriented to person, not oriented to place, month, or year. Very pleasant and cooperative.     Labs and Imaging on Admission: I have personally reviewed following labs and imaging studies  CBC: Recent Labs  Lab 08/16/19 1840  WBC 7.8  NEUTROABS 5.4  HGB 13.9  HCT 42.2  MCV 90.4  PLT [id number]   Basic Metabolic Panel: Recent Labs  Lab 08/16/19 1840  NA 140  K 3.5  CL 101  CO2 29  GLUCOSE 103*  BUN 25*  CREATININE 1.03*  CALCIUM 9.5   GFR: Estimated Creatinine Clearance: 41.8 mL/min (A) (by C-G formula based on SCr of 1.03 mg/dL (H)). Liver Function Tests: Recent Labs  Lab 08/16/19 1840  AST 16  ALT 11  ALKPHOS 55  BILITOT 0.6  PROT 6.8  ALBUMIN 4.0   No results for input(s): LIPASE, AMYLASE in the last 168 hours. No results for input(s): AMMONIA in the last 168 hours. Coagulation Profile: Recent Labs  Lab 08/16/19 1840  INR 1.0   Cardiac Enzymes: No results for input(s): CKTOTAL, CKMB, CKMBINDEX, TROPONINI in the last 168 hours. BNP (last 3 results) No results for input(s): PROBNP in the last 8760 hours. HbA1C: No results for input(s): HGBA1C in the last 72 hours. CBG: Recent Labs  Lab 08/16/19 1822  GLUCAP 92   Lipid Profile: No results for input(s): CHOL, HDL, LDLCALC, TRIG, CHOLHDL, LDLDIRECT in the last 72 hours. Thyroid Function Tests: No results for input(s): TSH, T4TOTAL, FREET4, T3FREE, THYROIDAB in the last 72 hours. Anemia Panel: No results for input(s): VITAMINB12, FOLATE, FERRITIN, TIBC, IRON, RETICCTPCT in the last 72 hours. Urine analysis:    Component Value Date/Time   COLORURINE YELLOW 05/20/2019 1803   APPEARANCEUR CLEAR 05/20/2019 1803   LABSPEC 1.035 (H) 05/20/2019 1803   PHURINE 7.0 05/20/2019 1803   GLUCOSEU NEGATIVE 05/20/2019 1803   HGBUR NEGATIVE 05/20/2019 1803   BILIRUBINUR NEGATIVE 05/20/2019 1803   KETONESUR 5 (A) 05/20/2019 1803   PROTEINUR NEGATIVE 05/20/2019 1803   NITRITE POSITIVE (A) 05/20/2019 1803   LEUKOCYTESUR SMALL (A) 05/20/2019 1803   Sepsis  Labs: @LABRCNTIP (procalcitonin:4,lacticidven:4) ) Recent Results (from the past 240 hour(s))  Respiratory Panel by RT PCR (Flu  A&B, Covid) - Nasopharyngeal Swab     Status: None   Collection Time: 08/16/19 10:11 PM   Specimen: Nasopharyngeal Swab  Result Value Ref Range Status   SARS Coronavirus 2 by RT PCR NEGATIVE NEGATIVE Final    Comment: (NOTE) SARS-CoV-2 target nucleic acids are NOT DETECTED. The SARS-CoV-2 RNA is generally detectable in upper respiratoy specimens during the acute phase of infection. The lowest concentration of SARS-CoV-2 viral copies this assay can detect is 131 copies/mL. A negative result does not preclude SARS-Cov-2 infection and should not be used as the sole basis for treatment or other patient management decisions. A negative result may occur with  improper specimen collection/handling, submission of specimen other than nasopharyngeal swab, presence of viral mutation(s) within the areas targeted by this assay, and inadequate number of viral copies (<131 copies/mL). A negative result must be combined with clinical observations, patient history, and epidemiological information. The expected result is Negative. Fact Sheet for Patients:  PinkCheek.be Fact Sheet for Healthcare Providers:  GravelBags.it This test is not yet ap proved or cleared by the [place] FDA and  has been authorized for detection and/or diagnosis of SARS-CoV-2 by FDA under an Emergency Use Authorization (EUA). This EUA will remain  in effect (meaning this test can be used) for the duration of the COVID-19 declaration under Section 564(b)(1) of the Act, 21 U.S.C. section 360bbb-3(b)(1), unless the authorization is terminated or revoked sooner.    Influenza A by PCR NEGATIVE NEGATIVE Final   Influenza B by PCR NEGATIVE NEGATIVE Final    Comment: (NOTE) The Xpert Xpress SARS-CoV-2/FLU/RSV assay is intended as an aid in  the  diagnosis of influenza from Nasopharyngeal swab specimens and  should not be used as a sole basis for treatment. Nasal washings and  aspirates are unacceptable for Xpert Xpress SARS-CoV-2/FLU/RSV  testing. Fact Sheet for Patients: PinkCheek.be Fact Sheet for Healthcare Providers: GravelBags.it This test is not yet approved or cleared by the [place] FDA and  has been authorized for detection and/or diagnosis of SARS-CoV-2 by  FDA under an Emergency Use Authorization (EUA). This EUA will remain  in effect (meaning this test can be used) for the duration of the  Covid-19 declaration under Section 564(b)(1) of the Act, 21  U.S.C. section 360bbb-3(b)(1), unless the authorization is  terminated or revoked. Performed at [facility], [address] [place], [place] [address]      Radiological Exams on Admission: CT HEAD WO CONTRAST  Result Date: 08/16/2019 CLINICAL DATA:  Encephalopathy. Additional history provided: Confusion EXAM: CT HEAD WITHOUT CONTRAST TECHNIQUE: Contiguous axial images were obtained from the base of the skull through the vertex without intravenous contrast. COMPARISON:  Brain MRI 03/21/2019, CT angiogram head/neck 03/21/2019 FINDINGS: Brain: No evidence of acute intracranial hemorrhage. No demarcated cortical infarction. No evidence of intracranial mass. No midline shift or extra-axial fluid collection. Redemonstrated chronic infarcts within the left cerebral white matter. Background advanced chronic small vessel ischemic disease, also similar to prior exam. Mild generalized parenchymal atrophy. Vascular: No hyperdense vessel.  Atherosclerotic calcifications. Skull: Normal. Negative for fracture or focal lesion. Sinuses/Orbits: Visualized orbits demonstrate no acute abnormality. No significant paranasal sinus disease or mastoid effusion at the imaged levels. IMPRESSION: No CT evidence of acute intracranial  abnormality. Generalized parenchymal atrophy with advanced chronic small vessel ischemic disease. Redemonstrated chronic infarcts within the left cerebral white matter. Electronically Signed   By: Kellie Simmering DO   On: 08/16/2019 19:07    EKG: Independently reviewed. Sinus rhythm,  early R-transition, QTc 490 ms.   Assessment/Plan   1. Acute encephalopathy  - Presents with ~2 days of somnolence and confusion that seemed to be waxing and waning per family, noted to have stuttering speech in ED  - No acute findings on head CT, labs with mild renal insufficiency  - She appears hypovolemic in ED and was started on IVF  - Check MRI brain, AMS labs, continue neuro checks, continue gentle IVF hydration    2. History of CVA  - She presents with somnolence, confusion, and has stuttering speech, possibly secondary to recurrent CVA  - She recently had ambulatory heart monitoring that was reportedly negative for a fib  - Continue ASA and statin, check MRI brain    3. Mild renal insufficiency  - SCr is 1.03 in ED, slightly up from priors  - She appears hypovolemic and is being hydrated with IVF, will repeat chem panel in am    4. Chronic systolic CHF  - EF was [id number] in August 2020  - Appears hypovolemic in ED  - Continue gentle IVF hydration, follow I/O's    5. Type II DM  - A1c was 6.3% in August 2020  - Hold metformin and use a low-intensity SSI with Novolog for now    6. Depression, anxiety  - Potentially sedating medications held on admission     DVT prophylaxis: Lovenox  Code Status: Full  Family Communication: Discussed with patient  Consults called: None  Admission status: Observation     Vianne Bulls, MD [facility] Pager: See [email or web address]  If 7AM-7PM, please contact the daytime attending [email or web address]  08/16/2019, 11:53 PM

## 2019-08-16 NOTE — ED Triage Notes (Signed)
Son reports that he spoke to mother yesterday and she was asking about her father, then he saw mother last night and she was normal.  When he saw her this afternoon. He had difficulty making out what she was saying

## 2019-08-17 ENCOUNTER — Observation Stay (HOSPITAL_COMMUNITY): Payer: Medicare HMO

## 2019-08-17 DIAGNOSIS — E538 Deficiency of other specified B group vitamins: Secondary | ICD-10-CM | POA: Diagnosis not present

## 2019-08-17 DIAGNOSIS — N390 Urinary tract infection, site not specified: Secondary | ICD-10-CM | POA: Diagnosis not present

## 2019-08-17 DIAGNOSIS — G9340 Encephalopathy, unspecified: Secondary | ICD-10-CM | POA: Diagnosis not present

## 2019-08-17 DIAGNOSIS — Z20822 Contact with and (suspected) exposure to covid-19: Secondary | ICD-10-CM | POA: Diagnosis not present

## 2019-08-17 DIAGNOSIS — Z79811 Long term (current) use of aromatase inhibitors: Secondary | ICD-10-CM | POA: Diagnosis not present

## 2019-08-17 DIAGNOSIS — Z86010 Personal history of colonic polyps: Secondary | ICD-10-CM | POA: Diagnosis not present

## 2019-08-17 DIAGNOSIS — E1140 Type 2 diabetes mellitus with diabetic neuropathy, unspecified: Secondary | ICD-10-CM | POA: Diagnosis present

## 2019-08-17 DIAGNOSIS — Z87891 Personal history of nicotine dependence: Secondary | ICD-10-CM | POA: Diagnosis not present

## 2019-08-17 DIAGNOSIS — F330 Major depressive disorder, recurrent, mild: Secondary | ICD-10-CM | POA: Diagnosis present

## 2019-08-17 DIAGNOSIS — Z8673 Personal history of transient ischemic attack (TIA), and cerebral infarction without residual deficits: Secondary | ICD-10-CM | POA: Diagnosis not present

## 2019-08-17 DIAGNOSIS — N179 Acute kidney failure, unspecified: Secondary | ICD-10-CM | POA: Diagnosis not present

## 2019-08-17 DIAGNOSIS — M1990 Unspecified osteoarthritis, unspecified site: Secondary | ICD-10-CM | POA: Diagnosis present

## 2019-08-17 DIAGNOSIS — I5022 Chronic systolic (congestive) heart failure: Secondary | ICD-10-CM | POA: Diagnosis not present

## 2019-08-17 DIAGNOSIS — Z791 Long term (current) use of non-steroidal anti-inflammatories (NSAID): Secondary | ICD-10-CM | POA: Diagnosis not present

## 2019-08-17 DIAGNOSIS — G9341 Metabolic encephalopathy: Secondary | ICD-10-CM | POA: Diagnosis not present

## 2019-08-17 DIAGNOSIS — Z888 Allergy status to other drugs, medicaments and biological substances status: Secondary | ICD-10-CM | POA: Diagnosis not present

## 2019-08-17 DIAGNOSIS — M8580 Other specified disorders of bone density and structure, unspecified site: Secondary | ICD-10-CM | POA: Diagnosis present

## 2019-08-17 DIAGNOSIS — Z7982 Long term (current) use of aspirin: Secondary | ICD-10-CM | POA: Diagnosis not present

## 2019-08-17 DIAGNOSIS — Z811 Family history of alcohol abuse and dependence: Secondary | ICD-10-CM | POA: Diagnosis not present

## 2019-08-17 DIAGNOSIS — E1149 Type 2 diabetes mellitus with other diabetic neurological complication: Secondary | ICD-10-CM | POA: Diagnosis not present

## 2019-08-17 DIAGNOSIS — E876 Hypokalemia: Secondary | ICD-10-CM | POA: Diagnosis not present

## 2019-08-17 DIAGNOSIS — C50911 Malignant neoplasm of unspecified site of right female breast: Secondary | ICD-10-CM | POA: Diagnosis not present

## 2019-08-17 DIAGNOSIS — N289 Disorder of kidney and ureter, unspecified: Secondary | ICD-10-CM | POA: Diagnosis not present

## 2019-08-17 DIAGNOSIS — Z818 Family history of other mental and behavioral disorders: Secondary | ICD-10-CM | POA: Diagnosis not present

## 2019-08-17 DIAGNOSIS — Z96651 Presence of right artificial knee joint: Secondary | ICD-10-CM | POA: Diagnosis present

## 2019-08-17 DIAGNOSIS — E860 Dehydration: Secondary | ICD-10-CM | POA: Diagnosis not present

## 2019-08-17 DIAGNOSIS — E7800 Pure hypercholesterolemia, unspecified: Secondary | ICD-10-CM | POA: Diagnosis present

## 2019-08-17 DIAGNOSIS — Z7984 Long term (current) use of oral hypoglycemic drugs: Secondary | ICD-10-CM | POA: Diagnosis not present

## 2019-08-17 DIAGNOSIS — R1310 Dysphagia, unspecified: Secondary | ICD-10-CM | POA: Diagnosis not present

## 2019-08-17 DIAGNOSIS — F418 Other specified anxiety disorders: Secondary | ICD-10-CM | POA: Diagnosis not present

## 2019-08-17 DIAGNOSIS — R69 Illness, unspecified: Secondary | ICD-10-CM | POA: Diagnosis not present

## 2019-08-17 LAB — RAPID URINE DRUG SCREEN, HOSP PERFORMED
Amphetamines: NOT DETECTED
Barbiturates: NOT DETECTED
Benzodiazepines: POSITIVE — AB
Cocaine: NOT DETECTED
Opiates: NOT DETECTED
Tetrahydrocannabinol: NOT DETECTED

## 2019-08-17 LAB — BASIC METABOLIC PANEL
Anion gap: 10 (ref 5–15)
BUN: 19 mg/dL (ref 8–23)
CO2: 27 mmol/L (ref 22–32)
Calcium: 8.4 mg/dL — ABNORMAL LOW (ref 8.9–10.3)
Chloride: 102 mmol/L (ref 98–111)
Creatinine, Ser: 0.69 mg/dL (ref 0.44–1.00)
GFR calc Af Amer: >60 mL/min (ref >60)
GFR calc non Af Amer: >60 mL/min (ref >60)
Glucose, Bld: 120 mg/dL — ABNORMAL HIGH (ref 70–99)
Potassium: 3 mmol/L — ABNORMAL LOW (ref 3.5–5.1)
Sodium: 139 mmol/L (ref 135–145)

## 2019-08-17 LAB — CBC
HCT: 40.5 % (ref 36.0–46.0)
Hemoglobin: 13.4 g/dL (ref 12.0–15.0)
MCH: 29.9 pg (ref 26.0–34.0)
MCHC: 33.1 g/dL (ref 30.0–36.0)
MCV: 90.4 fL (ref 80.0–100.0)
Platelets: 209 10*3/uL (ref 150–400)
RBC: 4.48 MIL/uL (ref 3.87–5.11)
RDW: 13.3 % (ref 11.5–15.5)
WBC: 5.9 10*3/uL (ref 4.0–10.5)
nRBC: 0 % (ref 0.0–0.2)

## 2019-08-17 LAB — GLUCOSE, CAPILLARY
Glucose-Capillary: 112 mg/dL — ABNORMAL HIGH (ref 70–99)
Glucose-Capillary: 115 mg/dL — ABNORMAL HIGH (ref 70–99)
Glucose-Capillary: 120 mg/dL — ABNORMAL HIGH (ref 70–99)
Glucose-Capillary: 133 mg/dL — ABNORMAL HIGH (ref 70–99)
Glucose-Capillary: 90 mg/dL (ref 70–99)

## 2019-08-17 LAB — URINALYSIS, ROUTINE W REFLEX MICROSCOPIC
Bilirubin Urine: NEGATIVE
Glucose, UA: NEGATIVE mg/dL
Hgb urine dipstick: NEGATIVE
Ketones, ur: 5 mg/dL — AB
Nitrite: POSITIVE — AB
Protein, ur: NEGATIVE mg/dL
Specific Gravity, Urine: 1.021 (ref 1.005–1.030)
pH: 5 (ref 5.0–8.0)

## 2019-08-17 LAB — TSH: TSH: 1.969 u[IU]/mL (ref 0.350–4.500)

## 2019-08-17 LAB — SEDIMENTATION RATE: Sed Rate: 2 mm/hr (ref 0–22)

## 2019-08-17 LAB — HEMOGLOBIN A1C
Hgb A1c MFr Bld: 6.3 % — ABNORMAL HIGH (ref 4.8–5.6)
Mean Plasma Glucose: 134.11 mg/dL

## 2019-08-17 LAB — HIV ANTIBODY (ROUTINE TESTING W REFLEX): HIV Screen 4th Generation wRfx: NONREACTIVE

## 2019-08-17 LAB — RPR: RPR Ser Ql: NONREACTIVE

## 2019-08-17 LAB — VITAMIN B12: Vitamin B-12: 161 pg/mL — ABNORMAL LOW (ref 180–914)

## 2019-08-17 LAB — AMMONIA: Ammonia: 11 umol/L (ref 9–35)

## 2019-08-17 MED ORDER — SENNOSIDES-DOCUSATE SODIUM 8.6-50 MG PO TABS: 1.0000 | ORAL_TABLET | Freq: Every evening | ORAL | Status: DC | PRN

## 2019-08-17 MED ORDER — ASPIRIN EC 81 MG PO TBEC
81.0000 mg | DELAYED_RELEASE_TABLET | Freq: Every morning | ORAL | Status: DC
  Administered 2019-08-18 – 2019-08-19 (×2): 81 mg via ORAL
  Filled 2019-08-17 (×2): qty 1

## 2019-08-17 MED ORDER — MELATONIN 3 MG PO TABS
6.0000 mg | ORAL_TABLET | Freq: Every evening | ORAL | Status: DC | PRN
  Administered 2019-08-17: 6 mg via ORAL
  Filled 2019-08-17: qty 2

## 2019-08-17 MED ORDER — SODIUM CHLORIDE 0.9 % IV SOLN
1.0000 g | INTRAVENOUS | Status: DC
  Administered 2019-08-17 – 2019-08-18 (×2): 1 g via INTRAVENOUS
  Filled 2019-08-17 (×2): qty 10

## 2019-08-17 MED ORDER — ACETAMINOPHEN 650 MG RE SUPP: 650.0000 mg | Freq: Four times a day (QID) | RECTAL | Status: DC | PRN

## 2019-08-17 MED ORDER — ONDANSETRON HCL 4 MG/2ML IJ SOLN: 4.0000 mg | Freq: Four times a day (QID) | INTRAMUSCULAR | Status: DC | PRN

## 2019-08-17 MED ORDER — ONDANSETRON HCL 4 MG PO TABS: 4.0000 mg | ORAL_TABLET | Freq: Four times a day (QID) | ORAL | Status: DC | PRN

## 2019-08-17 MED ORDER — POTASSIUM CHLORIDE 10 MEQ/100ML IV SOLN
10.0000 meq | INTRAVENOUS | Status: AC
  Administered 2019-08-17: 10 meq via INTRAVENOUS
  Filled 2019-08-17: qty 100

## 2019-08-17 MED ORDER — ACETAMINOPHEN 325 MG PO TABS
650.0000 mg | ORAL_TABLET | Freq: Four times a day (QID) | ORAL | Status: DC | PRN
  Administered 2019-08-18 – 2019-08-19 (×3): 650 mg via ORAL
  Filled 2019-08-17 (×4): qty 2

## 2019-08-17 MED ORDER — ENOXAPARIN SODIUM 40 MG/0.4ML ~~LOC~~ SOLN
40.0000 mg | SUBCUTANEOUS | Status: DC
  Administered 2019-08-17 – 2019-08-19 (×3): 40 mg via SUBCUTANEOUS
  Filled 2019-08-17 (×3): qty 0.4

## 2019-08-17 MED ORDER — SODIUM CHLORIDE 0.9% FLUSH: 3.0000 mL | Freq: Two times a day (BID) | INTRAVENOUS | Status: DC

## 2019-08-17 MED ORDER — ATORVASTATIN CALCIUM 40 MG PO TABS
40.0000 mg | ORAL_TABLET | Freq: Every day | ORAL | Status: DC
  Administered 2019-08-17 – 2019-08-18 (×2): 40 mg via ORAL
  Filled 2019-08-17 (×2): qty 1

## 2019-08-17 MED ORDER — POTASSIUM CHLORIDE IN NACL 20-0.45 MEQ/L-% IV SOLN: INTRAVENOUS | Status: DC

## 2019-08-17 MED ORDER — ALPRAZOLAM 0.5 MG PO TABS
0.5000 mg | ORAL_TABLET | Freq: Two times a day (BID) | ORAL | Status: DC | PRN
  Administered 2019-08-17 – 2019-08-18 (×2): 0.5 mg via ORAL
  Filled 2019-08-17 (×3): qty 1

## 2019-08-17 MED ORDER — LETROZOLE 2.5 MG PO TABS
2.5000 mg | ORAL_TABLET | Freq: Every day | ORAL | Status: DC
  Administered 2019-08-18 – 2019-08-19 (×2): 2.5 mg via ORAL
  Filled 2019-08-17 (×4): qty 1

## 2019-08-17 NOTE — Progress Notes (Signed)
Upon admission pt was incontinent of urine. External catheter placed

## 2019-08-17 NOTE — Evaluation (Signed)
Clinical/Bedside Swallow Evaluation Patient Details  Name: Amy [name] MRN: [id number] Date of Birth: [birth year]  Today's Date: 08/17/2019 Time: SLP Start Time (ACUTE ONLY): 51 SLP Stop Time (ACUTE ONLY): 1631 SLP Time Calculation (min) (ACUTE ONLY): 30 min  Past Medical History:  Past Medical History:  Diagnosis Date  . Anxiety   . Arthritis   . Back pain   . Colon polyps   . Depression   . Diabetes mellitus, type II ([place])    diet controlled  . Hypercholesterolemia   . Invasive ductal carcinoma of breast, female, right ([place])   . Osteopenia 11/26/2016  . Skin-picking disorder    has 3 open wounds on right wrist that are being treated. About a quarter in size.  . Stroke [facility])    Past Surgical History:  Past Surgical History:  Procedure Laterality Date  . APPENDECTOMY    . BACK SURGERY    . BREAST BIOPSY Right 10/09/2016   Procedure: BREAST BIOPSY WITH NEEDLE LOCALIZATION;  Surgeon: [name], MD;  Location: [facility] ORS;  Service: General;  Laterality: Right;  . BREAST SURGERY Right   . BUBBLE STUDY  03/23/2019   Procedure: BUBBLE STUDY;  Surgeon: [name], MD;  Location: [facility] ENDOSCOPY;  Service: Cardiovascular;;  . BUNIONECTOMY Bilateral   . FACIAL COSMETIC SURGERY     drooping eyelid and brow  . HERNIA REPAIR Right    Inguinal x2  . KNEE ARTHROSCOPY Right   . SHOULDER SURGERY Right    rotator cuff repair and removal of bone spur  . TEE WITHOUT CARDIOVERSION N/A 03/23/2019   Procedure: TRANSESOPHAGEAL ECHOCARDIOGRAM (TEE);  Surgeon: [name], MD;  Location: [facility] ENDOSCOPY;  Service: Cardiovascular;  Laterality: N/A;  . TEMPOROMANDIBULAR JOINT SURGERY Right   . TOTAL KNEE ARTHROPLASTY Right 05/05/2018   Procedure: TOTAL KNEE ARTHROPLASTY;  Surgeon: [name], MD;  Location: [facility]S;  Service: Orthopedics;  Laterality: Right;   HPI:  Amy [name] is a 84 y.o. female with medical history significant for type 2 diabetes mellitus, breast  cancer status postmastectomy now on letrozole, depression, anxiety, chronic systolic CHF, and history of CVAs, now presenting to the emergency department for evaluation of confusion.  Patient's son reports that patient seemed to be confused 2 days ago, seemed to be in her usual state yesterday, but then today was noted to be confused again and had some difficulty with her speech.  Patient acknowledges feeling confused over the past day or so, denies any headache, denies any focal numbness or weakness, and denies any residual deficits from her prior strokes.  She is able to give the name of the city she is in, but not the name of the hospital, unable to say what year it is, and unable to give her birthdate or year.  She denies any fevers, chills, cough, or shortness of breath.  She denies headache or change in vision or hearing.  There has not been any recent fall or trauma reported and she is not known to use alcohol or illicit substances. MRI and CT are negative for acute changes   Assessment / Plan / Recommendation Clinical Impression  Pt was provided clinical swallowing evaluation; Pt consumed thin liquids, regular textures and puree textures without overt s/sx of oropharyngeal dysphagia. Pt's daughter in law was present for evaluation and SLP reviewed universal aspiraiton precautions with Pt and Daughter in law. Pt was however unable to recall or report any events of yesterday and family reports Pt's thinking/memory  has been worse over the past few months. When Pt was found yesterday her speech was reportedly completely unintelligible however it cleared after ~20 minutes. Question if Pt would benefit from Speech language evaluation or recommendation for Speech therapy services following discharge for cognition, either Outpatient or HH for full and in-depth speech language evaluation. There are no further needs for dysphagia at this time, ST to sign off. Thank you for this referral. SLP Visit Diagnosis:  Dysphagia, unspecified (R13.10)    Aspiration Risk  Mild aspiration risk    Diet Recommendation Regular;Thin liquid   Liquid Administration via: Cup;Straw Medication Administration: Whole meds with liquid Supervision: Patient able to self feed Compensations: Slow rate;Small sips/bites Postural Changes: Seated upright at 90 degrees;Remain upright for at least 30 minutes after po intake    Other  Recommendations Oral Care Recommendations: Oral care BID   Follow up Recommendations Home health SLP;Outpatient SLP(for cognition)             Prognosis Prognosis for Safe Diet Advancement: Good      Swallow Study   General Date of Onset: 08/16/19 HPI: Amy [name] is a 84 y.o. female with medical history significant for type 2 diabetes mellitus, breast cancer status postmastectomy now on letrozole, depression, anxiety, chronic systolic CHF, and history of CVAs, now presenting to the emergency department for evaluation of confusion.  Patient's son reports that patient seemed to be confused 2 days ago, seemed to be in her usual state yesterday, but then today was noted to be confused again and had some difficulty with her speech.  Patient acknowledges feeling confused over the past day or so, denies any headache, denies any focal numbness or weakness, and denies any residual deficits from her prior strokes.  She is able to give the name of the city she is in, but not the name of the hospital, unable to say what year it is, and unable to give her birthdate or year.  She denies any fevers, chills, cough, or shortness of breath.  She denies headache or change in vision or hearing.  There has not been any recent fall or trauma reported and she is not known to use alcohol or illicit substances. MRI and CT are negative for acute changes Type of Study: Bedside Swallow Evaluation Previous Swallow Assessment: none in chart Diet Prior to this Study: NPO Temperature Spikes Noted: No Respiratory  Status: Room air History of Recent Intubation: No Behavior/Cognition: Alert;Cooperative;Pleasant mood Oral Cavity Assessment: Within Functional Limits Oral Care Completed by SLP: Recent completion by staff Oral Cavity - Dentition: Adequate natural dentition Vision: Functional for self-feeding Self-Feeding Abilities: Able to feed self Patient Positioning: Upright in bed Baseline Vocal Quality: Normal Volitional Cough: Strong Volitional Swallow: Able to elicit    Oral/Motor/Sensory Function Overall Oral Motor/Sensory Function: Within functional limits   Ice Chips Ice chips: Within functional limits   Thin Liquid Thin Liquid: Within functional limits    Nectar Thick Nectar Thick Liquid: Not tested   Honey Thick Honey Thick Liquid: Not tested   Puree Puree: Within functional limits   Solid     Solid: Within functional limits     Nadelyn Enriques H. Roddie Mc, [place] Speech Language Pathologist  Wende Bushy 08/17/2019,4:38 PM

## 2019-08-17 NOTE — Plan of Care (Signed)
  Problem: Acute Rehab PT Goals(only PT should resolve) Goal: Pt Will Go Supine/Side To Sit Outcome: Progressing Flowsheets (Taken 08/17/2019 1447) Pt will go Supine/Side to Sit: with modified independence Goal: Pt Will Go Sit To Supine/Side Outcome: Progressing Flowsheets (Taken 08/17/2019 1447) Pt will go Sit to Supine/Side: with modified independence Goal: Patient Will Transfer Sit To/From Stand Outcome: Progressing Flowsheets (Taken 08/17/2019 1447) Patient will transfer sit to/from stand: with modified independence Goal: Pt Will Transfer Bed To Chair/Chair To Bed Outcome: Progressing Flowsheets (Taken 08/17/2019 1447) Pt will Transfer Bed to Chair/Chair to Bed: with modified independence Goal: Pt Will Ambulate Outcome: Progressing Flowsheets (Taken 08/17/2019 1447) Pt will Ambulate:  100 feet  with modified independence  with rolling walker  2:48 PM, 08/17/19 Mearl Latin PT, DPT Physical Therapist at [facility]

## 2019-08-17 NOTE — Evaluation (Signed)
Physical Therapy Evaluation Patient Details Name: Amy [name] MRN: [id number] DOB: 23-Nov-1935 Today's Date: 08/17/2019   History of Present Illness  Amy [name] is a 84 y.o. female with medical history significant for type 2 diabetes mellitus, breast cancer status postmastectomy now on letrozole, depression, anxiety, chronic systolic CHF, and history of CVAs, now presenting to the emergency department for evaluation of confusion.  Patient's son reports that patient seemed to be confused 2 days ago, seemed to be in her usual state yesterday, but then today was noted to be confused again and had some difficulty with her speech.  Patient acknowledges feeling confused over the past day or so, denies any headache, denies any focal numbness or weakness, and denies any residual deficits from her prior strokes.  She is able to give the name of the city she is in, but not the name of the hospital, unable to say what year it is, and unable to give her birthdate or year.  She denies any fevers, chills, cough, or shortness of breath.  She denies headache or change in vision or hearing.  There has not been any recent fall or trauma reported and she is not known to use alcohol or illicit substances.    Clinical Impression  Patient limited for functional mobility as stated below secondary to BLE weakness, fatigue and poor standing balance. Patient appears confused at points during today's evaluation. She is able to complete bed mobility slowly and safely and requires min assist with transfer to standing for safety. She is slightly off balance upon standing without use of AD. Balance improves with RW and patient is able to ambulate with min guard A for safety. Patient appears to be near her baseline level of function.  Patient will benefit from continued physical therapy in hospital and recommended venue below to increase strength, balance, endurance for safe ADLs and gait.     Follow Up Recommendations Home  health PT    Equipment Recommendations  None recommended by PT    Recommendations for Other Services       Precautions / Restrictions Precautions Precautions: Fall Restrictions Weight Bearing Restrictions: No      Mobility  Bed Mobility Overal bed mobility: Needs Assistance Bed Mobility: Supine to Sit     Supine to sit: Supervision     General bed mobility comments: slow, labored transition to seated EOB  Transfers Overall transfer level: Needs assistance Equipment used: Rolling walker (2 wheeled) Transfers: Sit to/from [facility] Sit to Stand: Min assist Stand pivot transfers: Min assist       General transfer comment: assist for safety and balance, some unsteadiness upon standing, improves with RW  Ambulation/Gait Ambulation/Gait assistance: Min guard Gait Distance (Feet): 50 Feet Assistive device: Rolling walker (2 wheeled) Gait Pattern/deviations: Step-through pattern;Decreased step length - right;Decreased step length - left;Decreased stride length Gait velocity: decreased   General Gait Details: slow, labored gait; patient c/o being hot throughout session; patient unsteadiness improves with RW  Stairs            Wheelchair Mobility    Modified Rankin (Stroke Patients Only)       Balance Overall balance assessment: Needs assistance Sitting-balance support: Feet supported;No upper extremity supported Sitting balance-Leahy Scale: Good Sitting balance - Comments: seated EOB   Standing balance support: Bilateral upper extremity supported Standing balance-Leahy Scale: Fair Standing balance comment: using RW  Pertinent Vitals/Pain Pain Assessment: No/denies pain    Home Living Family/patient expects to be discharged to:: Private residence Living Arrangements: Alone Available Help at Discharge: Family;Available PRN/intermittently Type of Home: House Home Access: Stairs to  enter Entrance Stairs-Rails: None Entrance Stairs-Number of Steps: 2 Home Layout: Two level;Able to live on main level with bedroom/bathroom Home Equipment: Gilford Rile - 2 wheels;Cane - single point;Bedside commode;Shower seat      Prior Function Level of Independence: Needs assistance   Gait / Transfers Assistance Needed: uses RW for mobility   ADL's / Homemaking Assistance Needed: some assist with ADLs from family, previously had someone assisting with ADL but family has not been able to find replacement        Hand Dominance   Dominant Hand: Right    Extremity/Trunk Assessment   Upper Extremity Assessment Upper Extremity Assessment: Overall WFL for tasks assessed    Lower Extremity Assessment Lower Extremity Assessment: Generalized weakness       Communication   Communication: HOH;Expressive difficulties;Receptive difficulties  Cognition Arousal/Alertness: Awake/alert Behavior During Therapy: Flat affect;Agitated Overall Cognitive Status: (appears slightly confused)                                        General Comments      Exercises     Assessment/Plan    PT Assessment Patient needs continued PT services  PT Problem List Decreased strength;Decreased activity tolerance;Decreased balance;Decreased mobility;Decreased safety awareness       PT Treatment Interventions DME instruction;Gait training;Stair training;Functional mobility training;Therapeutic activities;Therapeutic exercise;Balance training;Neuromuscular re-education;Patient/family education    PT Goals (Current goals can be found in the Care Plan section)  Acute Rehab PT Goals Patient Stated Goal: Return home PT Goal Formulation: With patient Time For Goal Achievement: 08/31/19 Potential to Achieve Goals: Fair    Frequency Min 3X/week   Barriers to discharge        Co-evaluation               AM-PAC PT "6 Clicks" Mobility  Outcome Measure Help needed turning from  your back to your side while in a flat bed without using bedrails?: None Help needed moving from lying on your back to sitting on the side of a flat bed without using bedrails?: None Help needed moving to and from a bed to a chair (including a wheelchair)?: A Little Help needed standing up from a chair using your arms (e.g., wheelchair or bedside chair)?: A Little Help needed to walk in hospital room?: A Little Help needed climbing 3-5 steps with a railing? : A Lot 6 Click Score: 19    End of Session Equipment Utilized During Treatment: Gait belt Activity Tolerance: Patient tolerated treatment well;Patient limited by fatigue Patient left: in chair;with family/visitor present;with call bell/phone within reach Nurse Communication: Mobility status PT Visit Diagnosis: Unsteadiness on feet (R26.81);Other abnormalities of gait and mobility (R26.89);Muscle weakness (generalized) (M62.81)    Time: [id number] PT Time Calculation (min) (ACUTE ONLY): 35 min   Charges:   PT Evaluation $PT Eval Moderate Complexity: 1 Mod PT Treatments $Therapeutic Activity: 23-37 mins       2:45 PM, 08/17/19 Mearl Latin PT, DPT Physical Therapist at [facility]

## 2019-08-17 NOTE — Progress Notes (Signed)
Patient is shaking and tearful stating that she feels like she is "smothering". Patient states that she wants to leave and go home. Patient is extremely anxious and does not want to be alone. MD paged for home Xanax order to be ordered again. Patient stable and will continue to monitor.

## 2019-08-17 NOTE — Progress Notes (Signed)
PROGRESS NOTE    Amy [name]  [id number] DOB: 09-25-1935 DOA: 08/16/2019 PCP: Patient, No Pcp Per   Brief Narrative:  Per HPI: Amy [name] is a 84 y.o. female with medical history significant for type 2 diabetes mellitus, breast cancer status postmastectomy now on letrozole, depression, anxiety, chronic systolic CHF, and history of CVAs, now presenting to the emergency department for evaluation of confusion.  Patient's son reports that patient seemed to be confused 2 days ago, seemed to be in her usual state yesterday, but then today was noted to be confused again and had some difficulty with her speech.  Patient acknowledges feeling confused over the past day or so, denies any headache, denies any focal numbness or weakness, and denies any residual deficits from her prior strokes.  She is able to give the name of the city she is in, but not the name of the hospital, unable to say what year it is, and unable to give her birthdate or year.  She denies any fevers, chills, cough, or shortness of breath.  She denies headache or change in vision or hearing.  There has not been any recent fall or trauma reported and she is not known to use alcohol or illicit substances.  1/19: Patient has been admitted with acute encephalopathy and does not have any findings of CVA on MRI.  She does appear to have urinary tract infection for which I will start Rocephin and check urine cultures.  She was noted to have mild renal insufficiency and is noted to be taking Xanax at home.  She continues to have ongoing confusion per family members.  Will need continued monitoring for improvement.  Assessment & Plan:   Principal Problem:   Acute encephalopathy Active Problems:   Depression with anxiety   Invasive ductal carcinoma of breast, female, right ([place])   Type 2 diabetes mellitus with neurological complications (HCC)   History of CVA (cerebrovascular accident)   Mild renal insufficiency   Chronic  systolic CHF (congestive heart failure) (HCC)   Acute encephalopathy likely metabolic-multifactorial -Appears to have a UTI and uses Xanax at home -Continue to hold sedating agents and treat UTI as noted below -Maintain on fall precautions -Brain MRI negative for any acute findings, but there is findings of severe chronic small vessel disease  UTI -Maintain on Rocephin as ordered and monitor urine cultures  AKI-resolved -Continue to monitor on repeat lab work -Maintain on IV fluid until able to eat  Dysphagia -Keep n.p.o. until SLP evaluation -Failed bedside swallow evaluation  Hypokalemia -Try to replete IV and then p.o. once able to tolerate diet -Repeat labs along with magnesium in a.m.  Chronic systolic CHF -LVEF [id number] on 02/2019 -Appears dry -Continue gentle hydration with daily weights  Type 2 diabetes -Monitor on SSI for now and hold Metformin -A1c 6.3% in 02/2019  Depression/anxiety -Xanax held on admission   DVT prophylaxis: Lovenox Code Status: Full Family Communication: Discussed with daughter-in-law on phone Disposition Plan: Continue monitoring closely for treatment of hypokalemia as well as UTI.  Hopefully will have resolution of her encephalopathy.  Hold sedating medications.   Consultants:   None  Procedures:   As noted below  Antimicrobials:  Anti-infectives (From admission, onward)   Start     Dose/Rate Route Frequency Ordered Stop   08/17/19 0800  cefTRIAXone (ROCEPHIN) 1 g in sodium chloride 0.9 % 100 mL IVPB     1 g 200 mL/hr over 30 Minutes Intravenous Every 24 hours 08/17/19 0726  Subjective: Patient seen and evaluated today and does appear confused. No other complaints noted.  Objective: Vitals:   08/17/19 0708 08/17/19 0758 08/17/19 1052 08/17/19 1255  BP: 128/81  127/70 136/72  Pulse: 86  72 78  Resp: 18  18 20   Temp: 97.7 F (36.5 C)  98.1 F (36.7 C) 97.8 F (36.6 C)  TempSrc: Oral  Oral Oral  SpO2: 97% 98%  97% 98%  Weight:      Height:        Intake/Output Summary (Last 24 hours) at 08/17/2019 1339 Last data filed at 08/17/2019 0428 Gross per 24 hour  Intake 235.97 ml  Output 300 ml  Net -64.03 ml   Filed Weights   08/16/19 1938 08/17/19 0055  Weight: 76 kg 73.9 kg    Examination:  General exam: Appears calm and comfortable  Respiratory system: Clear to auscultation. Respiratory effort normal. Cardiovascular system: S1 & S2 heard, RRR. No JVD, murmurs, rubs, gallops or clicks. No pedal edema. Gastrointestinal system: Abdomen is nondistended, soft and nontender. No organomegaly or masses felt. Normal bowel sounds heard. Central nervous system: Alert and awake. Extremities: Symmetric 5 x 5 power. Skin: No rashes, lesions or ulcers Psychiatry: Judgement and insight appear normal. Mood & affect appropriate.     Data Reviewed: I have personally reviewed following labs and imaging studies  CBC: Recent Labs  Lab 08/16/19 1840 08/17/19 0451  WBC 7.8 5.9  NEUTROABS 5.4  --   HGB 13.9 13.4  HCT 42.2 40.5  MCV 90.4 90.4  PLT 239 [id number]   Basic Metabolic Panel: Recent Labs  Lab 08/16/19 1840 08/17/19 0451  NA 140 139  K 3.5 3.0*  CL 101 102  CO2 29 27  GLUCOSE 103* 120*  BUN 25* 19  CREATININE 1.03* 0.69  CALCIUM 9.5 8.4*   GFR: Estimated Creatinine Clearance: 54.3 mL/min (by C-G formula based on SCr of 0.69 mg/dL). Liver Function Tests: Recent Labs  Lab 08/16/19 1840  AST 16  ALT 11  ALKPHOS 55  BILITOT 0.6  PROT 6.8  ALBUMIN 4.0   No results for input(s): LIPASE, AMYLASE in the last 168 hours. Recent Labs  Lab 08/17/19 0031  AMMONIA 11   Coagulation Profile: Recent Labs  Lab 08/16/19 1840  INR 1.0   Cardiac Enzymes: No results for input(s): CKTOTAL, CKMB, CKMBINDEX, TROPONINI in the last 168 hours. BNP (last 3 results) No results for input(s): PROBNP in the last 8760 hours. HbA1C: Recent Labs    08/17/19 0451  HGBA1C 6.3*   CBG: Recent Labs   Lab 08/16/19 1822 08/17/19 0052 08/17/19 0832 08/17/19 1115  GLUCAP 92 90 120* 115*   Lipid Profile: No results for input(s): CHOL, HDL, LDLCALC, TRIG, CHOLHDL, LDLDIRECT in the last 72 hours. Thyroid Function Tests: Recent Labs    08/17/19 0031  TSH 1.969   Anemia Panel: Recent Labs    08/17/19 0031  VITAMINB12 161*   Sepsis Labs: No results for input(s): PROCALCITON, LATICACIDVEN in the last 168 hours.  Recent Results (from the past 240 hour(s))  Respiratory Panel by RT PCR (Flu A&B, Covid) - Nasopharyngeal Swab     Status: None   Collection Time: 08/16/19 10:11 PM   Specimen: Nasopharyngeal Swab  Result Value Ref Range Status   SARS Coronavirus 2 by RT PCR NEGATIVE NEGATIVE Final    Comment: (NOTE) SARS-CoV-2 target nucleic acids are NOT DETECTED. The SARS-CoV-2 RNA is generally detectable in upper respiratoy specimens during the acute phase of infection. The  lowest concentration of SARS-CoV-2 viral copies this assay can detect is 131 copies/mL. A negative result does not preclude SARS-Cov-2 infection and should not be used as the sole basis for treatment or other patient management decisions. A negative result may occur with  improper specimen collection/handling, submission of specimen other than nasopharyngeal swab, presence of viral mutation(s) within the areas targeted by this assay, and inadequate number of viral copies (<131 copies/mL). A negative result must be combined with clinical observations, patient history, and epidemiological information. The expected result is Negative. Fact Sheet for Patients:  PinkCheek.be Fact Sheet for Healthcare Providers:  GravelBags.it This test is not yet ap proved or cleared by the [place] FDA and  has been authorized for detection and/or diagnosis of SARS-CoV-2 by FDA under an Emergency Use Authorization (EUA). This EUA will remain  in effect (meaning this  test can be used) for the duration of the COVID-19 declaration under Section 564(b)(1) of the Act, 21 U.S.C. section 360bbb-3(b)(1), unless the authorization is terminated or revoked sooner.    Influenza A by PCR NEGATIVE NEGATIVE Final   Influenza B by PCR NEGATIVE NEGATIVE Final    Comment: (NOTE) The Xpert Xpress SARS-CoV-2/FLU/RSV assay is intended as an aid in  the diagnosis of influenza from Nasopharyngeal swab specimens and  should not be used as a sole basis for treatment. Nasal washings and  aspirates are unacceptable for Xpert Xpress SARS-CoV-2/FLU/RSV  testing. Fact Sheet for Patients: PinkCheek.be Fact Sheet for Healthcare Providers: GravelBags.it This test is not yet approved or cleared by the [place] FDA and  has been authorized for detection and/or diagnosis of SARS-CoV-2 by  FDA under an Emergency Use Authorization (EUA). This EUA will remain  in effect (meaning this test can be used) for the duration of the  Covid-19 declaration under Section 564(b)(1) of the Act, 21  U.S.C. section 360bbb-3(b)(1), unless the authorization is  terminated or revoked. Performed at [facility], [address] [place], [place] [address]          Radiology Studies: CT HEAD WO CONTRAST  Result Date: 08/16/2019 CLINICAL DATA:  Encephalopathy. Additional history provided: Confusion EXAM: CT HEAD WITHOUT CONTRAST TECHNIQUE: Contiguous axial images were obtained from the base of the skull through the vertex without intravenous contrast. COMPARISON:  Brain MRI 03/21/2019, CT angiogram head/neck 03/21/2019 FINDINGS: Brain: No evidence of acute intracranial hemorrhage. No demarcated cortical infarction. No evidence of intracranial mass. No midline shift or extra-axial fluid collection. Redemonstrated chronic infarcts within the left cerebral white matter. Background advanced chronic small vessel ischemic disease, also similar to  prior exam. Mild generalized parenchymal atrophy. Vascular: No hyperdense vessel.  Atherosclerotic calcifications. Skull: Normal. Negative for fracture or focal lesion. Sinuses/Orbits: Visualized orbits demonstrate no acute abnormality. No significant paranasal sinus disease or mastoid effusion at the imaged levels. IMPRESSION: No CT evidence of acute intracranial abnormality. Generalized parenchymal atrophy with advanced chronic small vessel ischemic disease. Redemonstrated chronic infarcts within the left cerebral white matter. Electronically Signed   By: Kellie Simmering DO   On: 08/16/2019 19:07   MR ANGIO HEAD WO CONTRAST  Result Date: 08/17/2019 CLINICAL DATA:  Encephalopathy. Weakness. EXAM: MRI HEAD WITHOUT CONTRAST MRA HEAD WITHOUT CONTRAST TECHNIQUE: Multiplanar, multiecho pulse sequences of the brain and surrounding structures were obtained without intravenous contrast. Angiographic images of the head were obtained using MRA technique without contrast. COMPARISON:  Head CT 08/16/2019, MRI 03/21/2019, and CTA 03/21/2019 FINDINGS: MRI HEAD FINDINGS The study is motion degraded throughout with some  sequences being moderately to severely degraded. Brain: No acute infarct is identified, however sensitivity for detection of very small infarcts is reduced by the degree of motion present on today's study. Chronic left cerebral hemispheric white matter infarcts are again noted. Patchy to confluent T2 hyperintensities in the cerebral white matter bilaterally are similar to the prior MRI and nonspecific but compatible with severe chronic small vessel ischemic disease. A punctate chronic right cerebellar infarct is unchanged. Scattered chronic bilateral cerebral microhemorrhages were better demonstrated on the prior MRI. Mild cerebral atrophy is within normal limits for age. No intracranial mass effect or extra-axial fluid collection is identified. Vascular: Major intracranial arterial flow voids are grossly  preserved. Skull and upper cervical spine: No suspicious marrow lesion. Grade 1 anterolisthesis of C2 on C3. Moderate ligamentous thickening posterior to the dens without spinal cord mass effect. Sinuses/Orbits: Grossly unremarkable orbits. Minimal mucosal thickening in the paranasal sinuses. Clear mastoid air cells. Other: None. MRA HEAD FINDINGS The study is motion degraded. The visualized distal vertebral arteries are patent to the basilar. Diminished signal in the proximal and mid portions of the left V4 segment is suspected to be largely artifactual with normal signal distally, however an underlying stenosis is not excluded. Of note, there was no significant left V4 stenosis on the prior CTA. Patent PICAs are seen bilaterally. The basilar artery is widely patent. The left PCA is patent without evidence of flow limiting proximal stenosis. The right P1 segment is widely patent. Assessment of the right P2 segment is limited by diminished signal which is likely secondary to motion artifact. The internal carotid arteries are patent from skull base to carotid termini without evidence of flow limiting stenosis. Both A1 and left M1 segments are patent without evidence of flow limiting stenosis. Diminished signal through the left ACA origin is likely artifactual given normal appearance on the prior CTA. ACA and MCA branch vessel evaluation is limited by motion. There is signal loss throughout the M1 and M2 segments on the right with some flow related enhancement visible in more distal branch vessels. There is substantial motion artifact through this region, and there was no proximal MCA stenosis on the prior CTA. No large aneurysm is identified. IMPRESSION: 1. Motion degraded head MRI without evidence of acute intracranial abnormality. 2. Severe chronic small vessel ischemic disease. 3. Motion degraded head MRA as above. Absent signal in the right MCA is likely artifactual given lack of a corresponding infarct and  normal appearance on the prior CTA. If further evaluation is clinically warranted, CTA could be performed. Electronically Signed   By: Logan Bores M.D.   On: 08/17/2019 11:51   MR BRAIN WO CONTRAST  Result Date: 08/17/2019 CLINICAL DATA:  Encephalopathy. Weakness. EXAM: MRI HEAD WITHOUT CONTRAST MRA HEAD WITHOUT CONTRAST TECHNIQUE: Multiplanar, multiecho pulse sequences of the brain and surrounding structures were obtained without intravenous contrast. Angiographic images of the head were obtained using MRA technique without contrast. COMPARISON:  Head CT 08/16/2019, MRI 03/21/2019, and CTA 03/21/2019 FINDINGS: MRI HEAD FINDINGS The study is motion degraded throughout with some sequences being moderately to severely degraded. Brain: No acute infarct is identified, however sensitivity for detection of very small infarcts is reduced by the degree of motion present on today's study. Chronic left cerebral hemispheric white matter infarcts are again noted. Patchy to confluent T2 hyperintensities in the cerebral white matter bilaterally are similar to the prior MRI and nonspecific but compatible with severe chronic small vessel ischemic disease. A punctate chronic right cerebellar  infarct is unchanged. Scattered chronic bilateral cerebral microhemorrhages were better demonstrated on the prior MRI. Mild cerebral atrophy is within normal limits for age. No intracranial mass effect or extra-axial fluid collection is identified. Vascular: Major intracranial arterial flow voids are grossly preserved. Skull and upper cervical spine: No suspicious marrow lesion. Grade 1 anterolisthesis of C2 on C3. Moderate ligamentous thickening posterior to the dens without spinal cord mass effect. Sinuses/Orbits: Grossly unremarkable orbits. Minimal mucosal thickening in the paranasal sinuses. Clear mastoid air cells. Other: None. MRA HEAD FINDINGS The study is motion degraded. The visualized distal vertebral arteries are patent to the  basilar. Diminished signal in the proximal and mid portions of the left V4 segment is suspected to be largely artifactual with normal signal distally, however an underlying stenosis is not excluded. Of note, there was no significant left V4 stenosis on the prior CTA. Patent PICAs are seen bilaterally. The basilar artery is widely patent. The left PCA is patent without evidence of flow limiting proximal stenosis. The right P1 segment is widely patent. Assessment of the right P2 segment is limited by diminished signal which is likely secondary to motion artifact. The internal carotid arteries are patent from skull base to carotid termini without evidence of flow limiting stenosis. Both A1 and left M1 segments are patent without evidence of flow limiting stenosis. Diminished signal through the left ACA origin is likely artifactual given normal appearance on the prior CTA. ACA and MCA branch vessel evaluation is limited by motion. There is signal loss throughout the M1 and M2 segments on the right with some flow related enhancement visible in more distal branch vessels. There is substantial motion artifact through this region, and there was no proximal MCA stenosis on the prior CTA. No large aneurysm is identified. IMPRESSION: 1. Motion degraded head MRI without evidence of acute intracranial abnormality. 2. Severe chronic small vessel ischemic disease. 3. Motion degraded head MRA as above. Absent signal in the right MCA is likely artifactual given lack of a corresponding infarct and normal appearance on the prior CTA. If further evaluation is clinically warranted, CTA could be performed. Electronically Signed   By: Logan Bores M.D.   On: 08/17/2019 11:51        Scheduled Meds: . aspirin EC  81 mg Oral q morning - 10a  . atorvastatin  40 mg Oral q1800  . enoxaparin (LOVENOX) injection  40 mg Subcutaneous Q24H  . insulin aspart  0-5 Units Subcutaneous QHS  . insulin aspart  0-9 Units Subcutaneous TID WC  .  letrozole  2.5 mg Oral Daily  . sodium chloride flush  3 mL Intravenous Q12H   Continuous Infusions: . cefTRIAXone (ROCEPHIN)  IV 1 g (08/17/19 1047)     LOS: 0 days    Time spent: 30 minutes    Gerber Penza Darleen Crocker, DO [facility] Pager [phone]  If 7PM-7AM, please contact night-coverage [email or web address] Password [email or web address] 08/17/2019, 1:39 PM

## 2019-08-18 ENCOUNTER — Ambulatory Visit (HOSPITAL_COMMUNITY): Payer: Medicare HMO | Admitting: Psychiatry

## 2019-08-18 ENCOUNTER — Telehealth (HOSPITAL_COMMUNITY): Payer: Self-pay | Admitting: *Deleted

## 2019-08-18 DIAGNOSIS — G9341 Metabolic encephalopathy: Secondary | ICD-10-CM

## 2019-08-18 DIAGNOSIS — E860 Dehydration: Secondary | ICD-10-CM

## 2019-08-18 LAB — BASIC METABOLIC PANEL
Anion gap: 12 (ref 5–15)
BUN: 9 mg/dL (ref 8–23)
CO2: 25 mmol/L (ref 22–32)
Calcium: 8.5 mg/dL — ABNORMAL LOW (ref 8.9–10.3)
Chloride: 102 mmol/L (ref 98–111)
Creatinine, Ser: 0.63 mg/dL (ref 0.44–1.00)
GFR calc Af Amer: >60 mL/min (ref >60)
GFR calc non Af Amer: >60 mL/min (ref >60)
Glucose, Bld: 147 mg/dL — ABNORMAL HIGH (ref 70–99)
Potassium: 3.5 mmol/L (ref 3.5–5.1)
Sodium: 139 mmol/L (ref 135–145)

## 2019-08-18 LAB — CBC
HCT: 42.3 % (ref 36.0–46.0)
Hemoglobin: 14.2 g/dL (ref 12.0–15.0)
MCH: 29.6 pg (ref 26.0–34.0)
MCHC: 33.6 g/dL (ref 30.0–36.0)
MCV: 88.3 fL (ref 80.0–100.0)
Platelets: 244 10*3/uL (ref 150–400)
RBC: 4.79 MIL/uL (ref 3.87–5.11)
RDW: 13.2 % (ref 11.5–15.5)
WBC: 6.2 10*3/uL (ref 4.0–10.5)
nRBC: 0 % (ref 0.0–0.2)

## 2019-08-18 LAB — GLUCOSE, CAPILLARY
Glucose-Capillary: 156 mg/dL — ABNORMAL HIGH (ref 70–99)
Glucose-Capillary: 211 mg/dL — ABNORMAL HIGH (ref 70–99)
Glucose-Capillary: 130 mg/dL — ABNORMAL HIGH (ref 70–99)
Glucose-Capillary: 111 mg/dL — ABNORMAL HIGH (ref 70–99)

## 2019-08-18 LAB — MAGNESIUM: Magnesium: 1.5 mg/dL — ABNORMAL LOW (ref 1.7–2.4)

## 2019-08-18 MED ORDER — ALPRAZOLAM 0.5 MG PO TABS
0.5000 mg | ORAL_TABLET | Freq: Three times a day (TID) | ORAL | Status: DC | PRN
  Administered 2019-08-18 – 2019-08-19 (×2): 0.5 mg via ORAL
  Filled 2019-08-18: qty 1

## 2019-08-18 MED ORDER — CYANOCOBALAMIN 1000 MCG/ML IJ SOLN
1000.0000 ug | Freq: Every day | INTRAMUSCULAR | Status: DC
  Administered 2019-08-18 – 2019-08-19 (×2): 1000 ug via INTRAMUSCULAR
  Filled 2019-08-18 (×2): qty 1

## 2019-08-18 NOTE — Progress Notes (Signed)
PROGRESS NOTE    [name]  [id number] DOB: [birth year] DOA: 08/16/2019 PCP: Patient, No Pcp Per   Brief Narrative:  Per HPI: Amy [name] is a 84 y.o. female with medical history significant for type 2 diabetes mellitus, breast cancer status postmastectomy now on letrozole, depression, anxiety, chronic systolic CHF, and history of CVAs, now presenting to the emergency department for evaluation of confusion.  Patient's son reports that patient seemed to be confused 2 days ago, seemed to be in her usual state yesterday, but then today was noted to be confused again and had some difficulty with her speech.  Patient acknowledges feeling confused over the past day or so, denies any headache, denies any focal numbness or weakness, and denies any residual deficits from her prior strokes.  She is able to give the name of the city she is in, but not the name of the hospital, unable to say what year it is, and unable to give her birthdate or year.  She denies any fevers, chills, cough, or shortness of breath.  She denies headache or change in vision or hearing.  There has not been any recent fall or trauma reported and she is not known to use alcohol or illicit substances.  1/19: Patient has been admitted with acute encephalopathy and does not have any findings of CVA on MRI.  She does appear to have urinary tract infection for which I will start Rocephin and check urine cultures.  She was noted to have mild renal insufficiency and is noted to be taking Xanax at home.  She continues to have ongoing confusion per family members.  Will need continued monitoring for improvement.  Assessment & Plan:   Principal Problem:   Acute encephalopathy Active Problems:   Depression with anxiety   Invasive ductal carcinoma of breast, female, right ([place])   Type 2 diabetes mellitus with neurological complications (HCC)   History of CVA (cerebrovascular accident)   Mild renal insufficiency   Chronic  systolic CHF (congestive heart failure) (HCC)   Acute metabolic encephalopathy   Acute encephalopathy likely metabolic-multifactorial -Appears to have a UTI and B12 deficiency -Continue to hold/minimize sedating agents and treat UTI as noted below -Follow physical therapy evaluation and initiate repletion/supplementation of B12. -Brain MRI negative for any acute findings, but there is findings of severe chronic small vessel disease  UTI -Maintain on Rocephin as ordered and monitor urine cultures -No dysuria.  AKI-resolved -Repeat basic metabolic panel in the morning -Creatinine appears to be back to normal after fluid resuscitation given  Dysphagia -Patient seen by speech therapy who has recommended regular diet with thin liquids. -Outpatient speech therapy for cognitive evaluation and further language improvement recommended.  Hypokalemia -Actively receiving repletion -Potassium within normal limits this morning. -Repeat basic metabolic panel in the morning to follow trend.  Chronic systolic CHF -LVEF [id number] on 02/2019 -No signs of fluid overload; overall compensated to mild degree of dehydration currently. -Continue gentle hydration  -Continue to follow strict I's and O's and daily weights  Type 2 diabetes -Continue holding oral hypoglycemic agents while inpatient -Continue sliding scale insulin -A1c 6.3% in 02/2019  Depression/anxiety -Mood appears to be stable -Continue adjusted dose of as needed Xanax to minimize chances of sedative medications.  B12 deficiency -B12 161 -start Repletion/supplementation.   DVT prophylaxis: Lovenox Code Status: Full Family Communication: Discussed with daughter-in-law on phone Disposition Plan: Continue IV antibiotics and follow culture results; continue fluid resuscitation and electrolyte repletion as needed; based on abnormal  B12 level initiate supplementation/repletion.  Follow physical therapy evaluation.  Discontinue  telemetry.   Consultants:   None  Procedures:   See below for x-ray reports.  Antimicrobials:  Anti-infectives (From admission, onward)   Start     Dose/Rate Route Frequency Ordered Stop   08/17/19 0800  cefTRIAXone (ROCEPHIN) 1 g in sodium chloride 0.9 % 100 mL IVPB     1 g 200 mL/hr over 30 Minutes Intravenous Every 24 hours 08/17/19 0726        Subjective: No fever, no chest pain, no nausea, no vomiting.  Some improvement in mentation reported, but now cognitively back to baseline as per family members.  On my examination patient was oriented to person oriented enough to remember her date of birth but was disoriented to place (thought she was in a junk yard) and also disoriented to time  Objective: Vitals:   08/18/19 0101 08/18/19 0358 08/18/19 0500 08/18/19 1347  BP: (!) 148/76  (!) 151/99 113/66  Pulse: 88  92 84  Resp: 16  16 18   Temp: 97.8 F (36.6 C)  98 F (36.7 C) 98.1 F (36.7 C)  TempSrc: Oral  Oral   SpO2: 98%  98% 97%  Weight:  74.2 kg    Height:        Intake/Output Summary (Last 24 hours) at 08/18/2019 1500 Last data filed at 08/18/2019 0900 Gross per 24 hour  Intake 1312.02 ml  Output 2250 ml  Net -937.98 ml   Filed Weights   08/16/19 1938 08/17/19 0055 08/18/19 0358  Weight: 76 kg 73.9 kg 74.2 kg    Examination: General exam: Alert, awake, oriented x 1.5 (person, season, weather, DOB); afebrile, no chest pain, no shortness of breath, no nausea or vomiting.  Some improvement appreciated inpatient mentation but is still not cognitively at baseline as per family member description. Respiratory system: Clear to auscultation. Respiratory effort normal. Cardiovascular system:RRR. No murmurs, rubs, gallops. Gastrointestinal system: Abdomen is nondistended, soft and nontender. No organomegaly or masses felt. Normal bowel sounds heard. Central nervous system: Alert and oriented X 2. No focal neurological deficits. Extremities: No cyanosis or  clubbing. Skin: No rashes, lesions or ulcers Psychiatry: Mood & affect appropriate.    Data Reviewed: I have personally reviewed following labs and imaging studies  CBC: Recent Labs  Lab 08/16/19 1840 08/17/19 0451 08/18/19 0556  WBC 7.8 5.9 6.2  NEUTROABS 5.4  --   --   HGB 13.9 13.4 14.2  HCT 42.2 40.5 42.3  MCV 90.4 90.4 88.3  PLT 239 209 [id number]   Basic Metabolic Panel: Recent Labs  Lab 08/16/19 1840 08/17/19 0451 08/18/19 0556  NA 140 139 139  K 3.5 3.0* 3.5  CL 101 102 102  CO2 29 27 25   GLUCOSE 103* 120* 147*  BUN 25* 19 9  CREATININE 1.03* 0.69 0.63  CALCIUM 9.5 8.4* 8.5*  MG  --   --  1.5*   GFR: Estimated Creatinine Clearance: 54.4 mL/min (by C-G formula based on SCr of 0.63 mg/dL).   Liver Function Tests: Recent Labs  Lab 08/16/19 1840  AST 16  ALT 11  ALKPHOS 55  BILITOT 0.6  PROT 6.8  ALBUMIN 4.0    Recent Labs  Lab 08/17/19 0031  AMMONIA 11   Coagulation Profile: Recent Labs  Lab 08/16/19 1840  INR 1.0   HbA1C: Recent Labs    08/17/19 0451  HGBA1C 6.3*   CBG: Recent Labs  Lab 08/17/19 1115 08/17/19 1651 08/17/19 2105  08/18/19 0730 08/18/19 1111  GLUCAP 115* 133* 112* 156* 211*   Thyroid Function Tests: Recent Labs    08/17/19 0031  TSH 1.969   Anemia Panel: Recent Labs    08/17/19 0031  VITAMINB12 161*    Recent Results (from the past 240 hour(s))  Respiratory Panel by RT PCR (Flu A&B, Covid) - Nasopharyngeal Swab     Status: None   Collection Time: 08/16/19 10:11 PM   Specimen: Nasopharyngeal Swab  Result Value Ref Range Status   SARS Coronavirus 2 by RT PCR NEGATIVE NEGATIVE Final    Comment: (NOTE) SARS-CoV-2 target nucleic acids are NOT DETECTED. The SARS-CoV-2 RNA is generally detectable in upper respiratoy specimens during the acute phase of infection. The lowest concentration of SARS-CoV-2 viral copies this assay can detect is 131 copies/mL. A negative result does not preclude SARS-Cov-2 infection  and should not be used as the sole basis for treatment or other patient management decisions. A negative result may occur with  improper specimen collection/handling, submission of specimen other than nasopharyngeal swab, presence of viral mutation(s) within the areas targeted by this assay, and inadequate number of viral copies (<131 copies/mL). A negative result must be combined with clinical observations, patient history, and epidemiological information. The expected result is Negative. Fact Sheet for Patients:  PinkCheek.be Fact Sheet for Healthcare Providers:  GravelBags.it This test is not yet ap proved or cleared by the [place] FDA and  has been authorized for detection and/or diagnosis of SARS-CoV-2 by FDA under an Emergency Use Authorization (EUA). This EUA will remain  in effect (meaning this test can be used) for the duration of the COVID-19 declaration under Section 564(b)(1) of the Act, 21 U.S.C. section 360bbb-3(b)(1), unless the authorization is terminated or revoked sooner.    Influenza A by PCR NEGATIVE NEGATIVE Final   Influenza B by PCR NEGATIVE NEGATIVE Final    Comment: (NOTE) The Xpert Xpress SARS-CoV-2/FLU/RSV assay is intended as an aid in  the diagnosis of influenza from Nasopharyngeal swab specimens and  should not be used as a sole basis for treatment. Nasal washings and  aspirates are unacceptable for Xpert Xpress SARS-CoV-2/FLU/RSV  testing. Fact Sheet for Patients: PinkCheek.be Fact Sheet for Healthcare Providers: GravelBags.it This test is not yet approved or cleared by the [place] FDA and  has been authorized for detection and/or diagnosis of SARS-CoV-2 by  FDA under an Emergency Use Authorization (EUA). This EUA will remain  in effect (meaning this test can be used) for the duration of the  Covid-19 declaration under Section  564(b)(1) of the Act, 21  U.S.C. section 360bbb-3(b)(1), unless the authorization is  terminated or revoked. Performed at [facility], [address], [place], [place] [address]   Culture, Urine     Status: Abnormal (Preliminary result)   Collection Time: 08/17/19  3:50 AM   Specimen: Urine, Clean Catch  Result Value Ref Range Status   Specimen Description   Final    URINE, CLEAN CATCH Performed at [facility], [address] [place], [place] [address]    Special Requests   Final    NONE Performed at [facility], [address] [place], [place] [address]    Culture >=100,000 COLONIES/mL GRAM NEGATIVE RODS (A)  Final   Report Status PENDING  Incomplete     Radiology Studies: CT HEAD WO CONTRAST  Result Date: 08/16/2019 CLINICAL DATA:  Encephalopathy. Additional history provided: Confusion EXAM: CT HEAD WITHOUT CONTRAST TECHNIQUE: Contiguous axial images were obtained from  the base of the skull through the vertex without intravenous contrast. COMPARISON:  Brain MRI 03/21/2019, CT angiogram head/neck 03/21/2019 FINDINGS: Brain: No evidence of acute intracranial hemorrhage. No demarcated cortical infarction. No evidence of intracranial mass. No midline shift or extra-axial fluid collection. Redemonstrated chronic infarcts within the left cerebral white matter. Background advanced chronic small vessel ischemic disease, also similar to prior exam. Mild generalized parenchymal atrophy. Vascular: No hyperdense vessel.  Atherosclerotic calcifications. Skull: Normal. Negative for fracture or focal lesion. Sinuses/Orbits: Visualized orbits demonstrate no acute abnormality. No significant paranasal sinus disease or mastoid effusion at the imaged levels. IMPRESSION: No CT evidence of acute intracranial abnormality. Generalized parenchymal atrophy with advanced chronic small vessel ischemic disease. Redemonstrated chronic infarcts within the left cerebral white matter. Electronically Signed   By: Kellie Simmering DO   On: 08/16/2019 19:07   MR ANGIO HEAD WO CONTRAST  Result Date: 08/17/2019 CLINICAL DATA:  Encephalopathy. Weakness. EXAM: MRI HEAD WITHOUT CONTRAST MRA HEAD WITHOUT CONTRAST TECHNIQUE: Multiplanar, multiecho pulse sequences of the brain and surrounding structures were obtained without intravenous contrast. Angiographic images of the head were obtained using MRA technique without contrast. COMPARISON:  Head CT 08/16/2019, MRI 03/21/2019, and CTA 03/21/2019 FINDINGS: MRI HEAD FINDINGS The study is motion degraded throughout with some sequences being moderately to severely degraded. Brain: No acute infarct is identified, however sensitivity for detection of very small infarcts is reduced by the degree of motion present on today's study. Chronic left cerebral hemispheric white matter infarcts are again noted. Patchy to confluent T2 hyperintensities in the cerebral white matter bilaterally are similar to the prior MRI and nonspecific but compatible with severe chronic small vessel ischemic disease. A punctate chronic right cerebellar infarct is unchanged. Scattered chronic bilateral cerebral microhemorrhages were better demonstrated on the prior MRI. Mild cerebral atrophy is within normal limits for age. No intracranial mass effect or extra-axial fluid collection is identified. Vascular: Major intracranial arterial flow voids are grossly preserved. Skull and upper cervical spine: No suspicious marrow lesion. Grade 1 anterolisthesis of C2 on C3. Moderate ligamentous thickening posterior to the dens without spinal cord mass effect. Sinuses/Orbits: Grossly unremarkable orbits. Minimal mucosal thickening in the paranasal sinuses. Clear mastoid air cells. Other: None. MRA HEAD FINDINGS The study is motion degraded. The visualized distal vertebral arteries are patent to the basilar. Diminished signal in the proximal and mid portions of the left V4 segment is suspected to be largely artifactual with normal  signal distally, however an underlying stenosis is not excluded. Of note, there was no significant left V4 stenosis on the prior CTA. Patent PICAs are seen bilaterally. The basilar artery is widely patent. The left PCA is patent without evidence of flow limiting proximal stenosis. The right P1 segment is widely patent. Assessment of the right P2 segment is limited by diminished signal which is likely secondary to motion artifact. The internal carotid arteries are patent from skull base to carotid termini without evidence of flow limiting stenosis. Both A1 and left M1 segments are patent without evidence of flow limiting stenosis. Diminished signal through the left ACA origin is likely artifactual given normal appearance on the prior CTA. ACA and MCA branch vessel evaluation is limited by motion. There is signal loss throughout the M1 and M2 segments on the right with some flow related enhancement visible in more distal branch vessels. There is substantial motion artifact through this region, and there was no proximal MCA stenosis on the prior CTA. No large aneurysm is identified. IMPRESSION:  1. Motion degraded head MRI without evidence of acute intracranial abnormality. 2. Severe chronic small vessel ischemic disease. 3. Motion degraded head MRA as above. Absent signal in the right MCA is likely artifactual given lack of a corresponding infarct and normal appearance on the prior CTA. If further evaluation is clinically warranted, CTA could be performed. Electronically Signed   By: Logan Bores M.D.   On: 08/17/2019 11:51   MR BRAIN WO CONTRAST  Result Date: 08/17/2019 CLINICAL DATA:  Encephalopathy. Weakness. EXAM: MRI HEAD WITHOUT CONTRAST MRA HEAD WITHOUT CONTRAST TECHNIQUE: Multiplanar, multiecho pulse sequences of the brain and surrounding structures were obtained without intravenous contrast. Angiographic images of the head were obtained using MRA technique without contrast. COMPARISON:  Head CT 08/16/2019,  MRI 03/21/2019, and CTA 03/21/2019 FINDINGS: MRI HEAD FINDINGS The study is motion degraded throughout with some sequences being moderately to severely degraded. Brain: No acute infarct is identified, however sensitivity for detection of very small infarcts is reduced by the degree of motion present on today's study. Chronic left cerebral hemispheric white matter infarcts are again noted. Patchy to confluent T2 hyperintensities in the cerebral white matter bilaterally are similar to the prior MRI and nonspecific but compatible with severe chronic small vessel ischemic disease. A punctate chronic right cerebellar infarct is unchanged. Scattered chronic bilateral cerebral microhemorrhages were better demonstrated on the prior MRI. Mild cerebral atrophy is within normal limits for age. No intracranial mass effect or extra-axial fluid collection is identified. Vascular: Major intracranial arterial flow voids are grossly preserved. Skull and upper cervical spine: No suspicious marrow lesion. Grade 1 anterolisthesis of C2 on C3. Moderate ligamentous thickening posterior to the dens without spinal cord mass effect. Sinuses/Orbits: Grossly unremarkable orbits. Minimal mucosal thickening in the paranasal sinuses. Clear mastoid air cells. Other: None. MRA HEAD FINDINGS The study is motion degraded. The visualized distal vertebral arteries are patent to the basilar. Diminished signal in the proximal and mid portions of the left V4 segment is suspected to be largely artifactual with normal signal distally, however an underlying stenosis is not excluded. Of note, there was no significant left V4 stenosis on the prior CTA. Patent PICAs are seen bilaterally. The basilar artery is widely patent. The left PCA is patent without evidence of flow limiting proximal stenosis. The right P1 segment is widely patent. Assessment of the right P2 segment is limited by diminished signal which is likely secondary to motion artifact. The internal  carotid arteries are patent from skull base to carotid termini without evidence of flow limiting stenosis. Both A1 and left M1 segments are patent without evidence of flow limiting stenosis. Diminished signal through the left ACA origin is likely artifactual given normal appearance on the prior CTA. ACA and MCA branch vessel evaluation is limited by motion. There is signal loss throughout the M1 and M2 segments on the right with some flow related enhancement visible in more distal branch vessels. There is substantial motion artifact through this region, and there was no proximal MCA stenosis on the prior CTA. No large aneurysm is identified. IMPRESSION: 1. Motion degraded head MRI without evidence of acute intracranial abnormality. 2. Severe chronic small vessel ischemic disease. 3. Motion degraded head MRA as above. Absent signal in the right MCA is likely artifactual given lack of a corresponding infarct and normal appearance on the prior CTA. If further evaluation is clinically warranted, CTA could be performed. Electronically Signed   By: Logan Bores M.D.   On: 08/17/2019 11:51  Scheduled Meds: . aspirin EC  81 mg Oral q morning - 10a  . atorvastatin  40 mg Oral q1800  . cyanocobalamin  1,000 mcg Intramuscular Daily  . enoxaparin (LOVENOX) injection  40 mg Subcutaneous Q24H  . insulin aspart  0-5 Units Subcutaneous QHS  . insulin aspart  0-9 Units Subcutaneous TID WC  . letrozole  2.5 mg Oral Daily  . sodium chloride flush  3 mL Intravenous Q12H   Continuous Infusions: . 0.45 % NaCl with KCl 20 mEq / L 75 mL/hr at 08/18/19 0300  . cefTRIAXone (ROCEPHIN)  IV 1 g (08/18/19 0841)     LOS: 1 day    Time spent: 30 minutes   Barton Dubois, MD [facility] Pager [phone]  08/18/2019, 3:00 PM

## 2019-08-18 NOTE — Telephone Encounter (Signed)
PATIENT 'S SON [place] CANCELLING APPOINTMENT WITH FRONT OFFICE TO INFORM THAT HIS MOTHER IS IN [place]. AND THAT @ TIME OF ADMISSION DR'S SAID  SHE MAY HAVE HAD ANOTHER STROKE? SHE DOES HAVE A UTI , AND IS VERY DEHYDRATED. ALSO THAT HER XANAX HAS CAUSED HER KIDNEY'S TO  NOT FUNCTION AS THEY SHOULD. [facility] LEFT HIS #  [phone]

## 2019-08-18 NOTE — Telephone Encounter (Signed)
Spoke to son and reviewed hospital records-pt does have a uti, slight dehydration, no evidence of untoward effects of meds

## 2019-08-18 NOTE — Progress Notes (Deleted)
Cardiology Office Note   Date:  08/18/2019   ID:  Amy [name], DOB [birth year], MRN [id number]  PCP:  Patient, No Pcp Per  Cardiologist:   [name], MD       History of Present Illness: Amy [name] is a 84 y.o. female with a history of       No outpatient medications have been marked as taking for the 08/20/19 encounter (Appointment) with Fay Records, MD.     Allergies:   Cymbalta [duloxetine hcl]   Past Medical History:  Diagnosis Date  . Anxiety   . Arthritis   . Back pain   . Colon polyps   . Depression   . Diabetes mellitus, type II ([place])    diet controlled  . Hypercholesterolemia   . Invasive ductal carcinoma of breast, female, right ([place])   . Osteopenia 11/26/2016  . Skin-picking disorder    has 3 open wounds on right wrist that are being treated. About a quarter in size.  . Stroke [facility])     Past Surgical History:  Procedure Laterality Date  . APPENDECTOMY    . BACK SURGERY    . BREAST BIOPSY Right 10/09/2016   Procedure: BREAST BIOPSY WITH NEEDLE LOCALIZATION;  Surgeon: [name], MD;  Location: [facility] ORS;  Service: General;  Laterality: Right;  . BREAST SURGERY Right   . BUBBLE STUDY  03/23/2019   Procedure: BUBBLE STUDY;  Surgeon: [name], MD;  Location: [facility] ENDOSCOPY;  Service: Cardiovascular;;  . BUNIONECTOMY Bilateral   . FACIAL COSMETIC SURGERY     drooping eyelid and brow  . HERNIA REPAIR Right    Inguinal x2  . KNEE ARTHROSCOPY Right   . SHOULDER SURGERY Right    rotator cuff repair and removal of bone spur  . TEE WITHOUT CARDIOVERSION N/A 03/23/2019   Procedure: TRANSESOPHAGEAL ECHOCARDIOGRAM (TEE);  Surgeon: [name], MD;  Location: [facility] ENDOSCOPY;  Service: Cardiovascular;  Laterality: N/A;  . TEMPOROMANDIBULAR JOINT SURGERY Right   . TOTAL KNEE ARTHROPLASTY Right 05/05/2018   Procedure: TOTAL KNEE ARTHROPLASTY;  Surgeon: [name], MD;  Location: [facility]S;  Service: Orthopedics;  Laterality:  Right;     Social History:  The patient  reports that she quit smoking about 23 years ago. Her smoking use included cigarettes. She has a 20.00 pack-year smoking history. She has never used smokeless tobacco. She reports that she does not drink alcohol or use drugs.   Family History:  The patient's family history includes Alcohol abuse in her brother; Anxiety disorder in her mother; Cancer - Other in her sister; Depression in her maternal grandmother and mother.    ROS:  Please see the history of present illness. All other systems are reviewed and  Negative to the above problem except as noted.    PHYSICAL EXAM: VS:  There were no vitals taken for this visit.  GEN: Well nourished, well developed, in no acute distress  HEENT: normal  Neck: no JVD, carotid bruits, or masses Cardiac: RRR; no murmurs, rubs, or gallops,no edema  Respiratory:  clear to auscultation bilaterally, normal work of breathing GI: soft, nontender, nondistended, + BS  No hepatomegaly  MS: no deformity Moving all extremities   Skin: warm and dry, no rash Neuro:  Strength and sensation are intact Psych: euthymic mood, full affect   EKG:  EKG is ordered today.   Lipid Panel    Component Value Date/Time   CHOL 172 05/06/2019 1451  TRIG 203 (H) 05/06/2019 1451   HDL 83 05/06/2019 1451   CHOLHDL 2.1 05/06/2019 1451   CHOLHDL 3.5 03/21/2019 0647   VLDL 35 03/21/2019 0647   LDLCALC 57 05/06/2019 1451      Wt Readings from Last 3 Encounters:  08/18/19 163 lb 9.3 oz (74.2 kg)  07/02/19 166 lb (75.3 kg)  05/20/19 175 lb 0.7 oz (79.4 kg)      ASSESSMENT AND PLAN:     Current medicines are reviewed at length with the patient today.  The patient does not have concerns regarding medicines.  Signed, [name], MD  08/18/2019 8:05 AM    [facility] [place], [place], [place]  [address] Phone: [phone]; Fax: [phone]

## 2019-08-18 NOTE — Progress Notes (Signed)
Physical Therapy Treatment Patient Details Name: ROSENDA HORWEDEL MRN: [id number] DOB: 06/20/1936 Today's Date: 08/18/2019    History of Present Illness KIMIAH COSENTINO is a 84 y.o. female with medical history significant for type 2 diabetes mellitus, breast cancer status postmastectomy now on letrozole, depression, anxiety, chronic systolic CHF, and history of CVAs, now presenting to the emergency department for evaluation of confusion.  Patient's son reports that patient seemed to be confused 2 days ago, seemed to be in her usual state yesterday, but then today was noted to be confused again and had some difficulty with her speech.  Patient acknowledges feeling confused over the past day or so, denies any headache, denies any focal numbness or weakness, and denies any residual deficits from her prior strokes.  She is able to give the name of the city she is in, but not the name of the hospital, unable to say what year it is, and unable to give her birthdate or year.  She denies any fevers, chills, cough, or shortness of breath.  She denies headache or change in vision or hearing.  There has not been any recent fall or trauma reported and she is not known to use alcohol or illicit substances.    PT Comments    Pt. Sitting in chair and willing to participate with therapy.  Pt slightly confused, able to state name and DOB with wrong year, and believed she was in [place].  Pt. Frequently stated "I need to return to my room".  Pt c/o being too hot through session, heat reduced for comfort, as well as LBP and unable to get comfortable.  Pt making gains with mobility with min A required through session with transfer and gait training.  Used RW for balance and no LOB episodes.  Did require 1 seated rest break during gait due to fatigue.  EOS pt left in chair with call bell within reach and chair alarm set.  RN aware of pain and pt. Status.      Follow Up Recommendations  Home health PT     Equipment  Recommendations  None recommended by PT    Recommendations for Other Services       Precautions / Restrictions Precautions Precautions: Fall Restrictions Weight Bearing Restrictions: No    Mobility  Bed Mobility               General bed mobility comments: Pt in chair upon entrance.  Transfers Overall transfer level: Needs assistance Equipment used: Rolling walker (2 wheeled) Transfers: Sit to/from Stand Sit to Stand: Min assist         General transfer comment: assist for safety and balance, some unsteadiness upon standing.  Cueing for hand placement to assist with STS, reach for RW upon standing.  Ambulation/Gait Ambulation/Gait assistance: Min guard Gait Distance (Feet): 50 Feet Assistive device: Rolling walker (2 wheeled) Gait Pattern/deviations: Step-through pattern;Decreased step length - right;Decreased step length - left;Decreased stride length Gait velocity: decreased   General Gait Details: slow, labored gait; patient c/o being hot throughout session; patient unsteadiness improves with RW   Stairs             Wheelchair Mobility    Modified Rankin (Stroke Patients Only)       Balance  Cognition Arousal/Alertness: Awake/alert Behavior During Therapy: Flat affect;Agitated Overall Cognitive Status: History of cognitive impairments - at baseline(Confused)                                 General Comments: Pt slightly confused.  Able to state her name, DOB but wrong year, and believes she is [place].  Several times she stated that she "needs to get out this room" thorugh session.  Also c/o being too hot, heat adjusted in room.      Exercises General Exercises - Lower Extremity Long Arc Quad: Both;10 reps;Seated Toe Raises: Both;10 reps;Seated Heel Raises: Both;10 reps;Seated    General Comments        Pertinent Vitals/Pain Pain Assessment: 0-10 Pain Score:  6  Pain Location: Mid back pain Pain Intervention(s): Limited activity within patient's tolerance;Monitored during session;Patient requesting pain meds-RN notified;Repositioned    Home Living                      Prior Function            PT Goals (current goals can now be found in the care plan section)      Frequency    Min 3X/week      PT Plan      Co-evaluation              AM-PAC PT "6 Clicks" Mobility   Outcome Measure  Help needed turning from your back to your side while in a flat bed without using bedrails?: None Help needed moving from lying on your back to sitting on the side of a flat bed without using bedrails?: None Help needed moving to and from a bed to a chair (including a wheelchair)?: A Little Help needed standing up from a chair using your arms (e.g., wheelchair or bedside chair)?: A Little Help needed to walk in hospital room?: A Little Help needed climbing 3-5 steps with a railing? : A Lot 6 Click Score: 19    End of Session Equipment Utilized During Treatment: Gait belt Activity Tolerance: Patient tolerated treatment well;Patient limited by fatigue Patient left: in chair;with family/visitor present;with call bell/phone within reach Nurse Communication: Mobility status PT Visit Diagnosis: Unsteadiness on feet (R26.81);Other abnormalities of gait and mobility (R26.89);Muscle weakness (generalized) (M62.81)     Time: [id number] PT Time Calculation (min) (ACUTE ONLY): 26 min  Charges:  $Therapeutic Activity: 23-37 mins                     [address], LPTA; CBIS [phone]   Aldona Lento 08/18/2019, 4:43 PM

## 2019-08-19 DIAGNOSIS — R5381 Other malaise: Secondary | ICD-10-CM

## 2019-08-19 DIAGNOSIS — N390 Urinary tract infection, site not specified: Secondary | ICD-10-CM

## 2019-08-19 DIAGNOSIS — F330 Major depressive disorder, recurrent, mild: Secondary | ICD-10-CM

## 2019-08-19 DIAGNOSIS — C50911 Malignant neoplasm of unspecified site of right female breast: Secondary | ICD-10-CM

## 2019-08-19 DIAGNOSIS — E860 Dehydration: Secondary | ICD-10-CM

## 2019-08-19 DIAGNOSIS — E538 Deficiency of other specified B group vitamins: Secondary | ICD-10-CM

## 2019-08-19 LAB — BASIC METABOLIC PANEL
Anion gap: 10 (ref 5–15)
BUN: 10 mg/dL (ref 8–23)
CO2: 25 mmol/L (ref 22–32)
Calcium: 8.5 mg/dL — ABNORMAL LOW (ref 8.9–10.3)
Chloride: 106 mmol/L (ref 98–111)
Creatinine, Ser: 0.65 mg/dL (ref 0.44–1.00)
GFR calc Af Amer: >60 mL/min (ref >60)
GFR calc non Af Amer: >60 mL/min (ref >60)
Glucose, Bld: 166 mg/dL — ABNORMAL HIGH (ref 70–99)
Potassium: 3.7 mmol/L (ref 3.5–5.1)
Sodium: 141 mmol/L (ref 135–145)

## 2019-08-19 LAB — URINE CULTURE: Culture: >=100000 — AB

## 2019-08-19 LAB — GLUCOSE, CAPILLARY
Glucose-Capillary: 157 mg/dL — ABNORMAL HIGH (ref 70–99)
Glucose-Capillary: 392 mg/dL — ABNORMAL HIGH (ref 70–99)

## 2019-08-19 MED ORDER — CEFDINIR 300 MG PO CAPS
300.0000 mg | ORAL_CAPSULE | Freq: Two times a day (BID) | ORAL | Status: DC
  Administered 2019-08-19: 300 mg via ORAL
  Filled 2019-08-19: qty 1

## 2019-08-19 MED ORDER — ALPRAZOLAM 0.5 MG PO TABS: 0.5000 mg | ORAL_TABLET | Freq: Two times a day (BID) | ORAL | Status: DC | PRN

## 2019-08-19 MED ORDER — CYANOCOBALAMIN 1000 MCG/ML IJ SOLN: INTRAMUSCULAR | 0 refills | Status: DC

## 2019-08-19 MED ORDER — CEFDINIR 300 MG PO CAPS: 300.0000 mg | ORAL_CAPSULE | Freq: Two times a day (BID) | ORAL | 0 refills | Status: AC

## 2019-08-19 NOTE — Discharge Summary (Signed)
Physician Discharge Summary  Amy [name] [id number] DOB: 04/05/1936 DOA: 08/16/2019  PCP: Patient, No Pcp Per  Admit date: 08/16/2019 Discharge date: 08/19/2019  Time spent: 35 minutes  Recommendations for Outpatient Follow-up:  1. Repeat basic metabolic panel 2. Repeat B12 level in 12-month   Discharge Diagnoses:  Principal Problem:   Acute encephalopathy Active Problems:   Depression with anxiety   Invasive ductal carcinoma of breast, female, right (Home)   Type 2 diabetes mellitus with neurological complications (HCC)   History of CVA (cerebrovascular accident)   Mild renal insufficiency   Chronic systolic CHF (congestive heart failure) (HCC)   Acute metabolic encephalopathy   Dehydration   Major depressive disorder, recurrent episode, mild with anxious distress (HCC)   Acute lower UTI   Deficiency of vitamin B12   Physical deconditioning   Discharge Condition: Stable and improved.  Discharged home with instruction to follow-up with PCP in 10 days.  Given findings of physical deconditioning Home health services has been arranged to provide home health PT and home health aide.  Diet recommendation: Modified carbohydrate diet and heart healthy diet.  Filed Weights   08/17/19 0055 08/18/19 0358 08/19/19 0500  Weight: 73.9 kg 74.2 kg 73.8 kg    History of present illness:  As per H&P written by Dr. Myna Hidalgo on 08/16/2019 84 y.o. female with medical history significant for type 2 diabetes mellitus, breast cancer status postmastectomy now on letrozole, depression, anxiety, chronic systolic CHF, and history of CVAs, now presenting to the emergency department for evaluation of confusion.  Patient's son reports that patient seemed to be confused 2 days ago, seemed to be in her usual state yesterday, but then today was noted to be confused again and had some difficulty with her speech.  Patient acknowledges feeling confused over the past day or so, denies any headache, denies any  focal numbness or weakness, and denies any residual deficits from her prior strokes.  She is able to give the name of the city she is in, but not the name of the hospital, unable to say what year it is, and unable to give her birthdate or year.  She denies any fevers, chills, cough, or shortness of breath.  She denies headache or change in vision or hearing.  There has not been any recent fall or trauma reported and she is not known to use alcohol or illicit substances.  ED Course: Upon arrival to the ED, patient is found to be afebrile, saturating mid 80s on room air, and with stable blood pressure.  EKG features sinus rhythm with early R transition.  Noncontrast head CT is negative for acute intracranial abnormality but notable for advanced chronic small vessel ischemic disease and chronic infarctions.  Chemistry panel features a creatinine 1.03, up from 0.92 in November 2020.  CBC is unremarkable.  Patient was given IV fluids in the ED, Covid PCR screening test was negative, urinalysis ordered but not yet collected due to empty bladder on bladder scan.  Hospitalists were consulted for admission.  Hospital Course:  Acute encephalopathy likely metabolic-multifactorial -Appears to have a UTI and B12 deficiency -Continue to hold/minimize sedating agents and treat UTI as noted below -Follow physical therapy evaluation and initiate repletion/supplementation of B12. -Brain MRI negative for any acute findings, but there is findings of severe chronic small vessel disease and old strokes. -Outpatient further evaluation to rule out development of vascular dementia.  E. coli UTI -Maintain on Rocephin during hospitalization -Following culture results patient discharged on cefdinir  to complete antibiotic therapy. -No dysuria. -Patient advised to maintain adequate hydration.  AKI-resolved -Creatinine appears to be back to normal after fluid resuscitation given -Repeat basic metabolic panel to follow renal  function and stability.  Dysphagia -Patient seen by speech therapy who has recommended regular diet with thin liquids.  Hypokalemia -Repleted and within normal limits at discharge -Repeat basic metabolic panel follow-up visit to reassess electrolytes trend.  Chronic systolic CHF -LVEF [id number] on 02/2019 -No signs of fluid overload; overall compensated -Continue to follow low-sodium diet and daily weights. -Resume the use of Coreg and losartan.  Type 2 diabetes with neuropathy -Resume home hypoglycemic agents. -Modified carbohydrate diet has been encouraged. -A1c 6.3% in 02/2019  Depression/anxiety -Mood appears to be stable -Continue adjusted dose of as needed Xanax to minimize chances of sedative medications. -Will recommend outpatient follow-up with psychiatry service.  B12 deficiency -B12 161 -started on Repletion/supplementation. -Please repeat B12 level in 3 months.  History of breast cancer -Continue outpatient follow-up with oncology service -Continue letrozole  History of neuropathy -Continue nightly Neurontin  Procedures: See below for x-ray reports. Consultations:  None  Discharge Exam: Vitals:   08/18/19 2101 08/19/19 0602  BP: 140/81 (!) 154/73  Pulse: 99 79  Resp: 17 18  Temp: 97.9 F (36.6 C) 98.1 F (36.7 C)  SpO2: 98% 97%    General: In no acute distress.  Expressed being happy after receiving information of going home today.  Patient was oriented to person, date of birth, whether and was able to say that she was in the hospital (she could not remember the name of the hospital).  No nausea, no vomiting, no chest pain, no shortness of breath, patient is afebrile. Cardiovascular: RRR.  No murmurs, no rubs, no gallops.  No JVD on exam. Respiratory: Clear to auscultation bilaterally.  Respiratory effort normal.  Good O2 sat on room air. Abdomen: Soft, nontender, distended, positive bowel sounds Extremities: No cyanosis, no  clubbing.  Discharge Instructions   Discharge Instructions    Diet - low sodium heart healthy   Complete by: As directed    Discharge instructions   Complete by: As directed    Maintain adequate hydration Follow heart healthy modified carbohydrate diet Take medications as prescribed Arrange follow-up with PCP in 10 days.   Increase activity slowly   Complete by: As directed      Allergies as of 08/19/2019      Reactions   Cymbalta [duloxetine Hcl] Other (See Comments)   Hallucinated and couldn't think; got worse and worse the longer she took this.      Medication List    STOP taking these medications   ibuprofen 800 MG tablet Commonly known as: ADVIL     TAKE these medications   ALPRAZolam 0.5 MG tablet Commonly known as: XANAX Take 1 tablet (0.5 mg total) by mouth every 12 (twelve) hours as needed for anxiety. What changed: when to take this   aspirin EC 81 MG tablet Take 81 mg by mouth every morning.   atorvastatin 40 MG tablet Commonly known as: LIPITOR Take 1 tablet (40 mg total) by mouth daily at 6 PM.   calcium carbonate 1500 (600 Ca) MG Tabs tablet Commonly known as: OSCAL Take 600 mg of elemental calcium by mouth daily with supper.   carvedilol 3.125 MG tablet Commonly known as: COREG Take 1 tablet (3.125 mg total) by mouth 2 (two) times daily.   cefdinir 300 MG capsule Commonly known as: OMNICEF Take 1 capsule (  300 mg total) by mouth every 12 (twelve) hours for 5 days.   cholecalciferol 1000 units tablet Commonly known as: VITAMIN D Take 1 tablet (1,000 Units total) by mouth daily.   cyanocobalamin 1000 MCG/ML injection Commonly known as: (VITAMIN B-12) Daily for 4 days; then weekly for 1 month and then monthly after that.   escitalopram 20 MG tablet Commonly known as: LEXAPRO Take 1 tablet (20 mg total) by mouth daily.   gabapentin 300 MG capsule Commonly known as: NEURONTIN Take 1 capsule (300 mg total) by mouth at bedtime.   letrozole  2.5 MG tablet Commonly known as: FEMARA TAKE 1 TABLET(2.5 MG) BY MOUTH DAILY What changed:   how much to take  how to take this  when to take this  additional instructions   losartan 25 MG tablet Commonly known as: COZAAR Take 0.5 tablets (12.5 mg total) by mouth at bedtime.   metFORMIN 500 MG tablet Commonly known as: GLUCOPHAGE Take 1 tablet (500 mg total) by mouth 2 (two) times daily with a meal.   risperiDONE 1 MG tablet Commonly known as: RisperDAL Take 1 tablet (1 mg total) by mouth at bedtime.   traZODone 50 MG tablet Commonly known as: DESYREL TAKE 1 TABLET(50 MG) BY MOUTH AT BEDTIME What changed:   how much to take  how to take this  when to take this  additional instructions   venlafaxine XR 150 MG 24 hr capsule Commonly known as: EFFEXOR-XR TAKE 1 CAPSULE(150 MG) BY MOUTH TWICE DAILY What changed:   how much to take  how to take this  when to take this  additional instructions      Allergies  Allergen Reactions  . Cymbalta [Duloxetine Hcl] Other (See Comments)    Hallucinated and couldn't think; got worse and worse the longer she took this.   Follow-up Information    [facility] Follow [facility].   Specialty: Home Health Services Why:  PT/ AIDE          The results of significant diagnostics from this hospitalization (including imaging, microbiology, ancillary and laboratory) are listed below for reference.    Significant Diagnostic Studies: CT HEAD WO CONTRAST  Result Date: 08/16/2019 CLINICAL DATA:  Encephalopathy. Additional history provided: Confusion EXAM: CT HEAD WITHOUT CONTRAST TECHNIQUE: Contiguous axial images were obtained from the base of the skull through the vertex without intravenous contrast. COMPARISON:  Brain MRI 03/21/2019, CT angiogram head/neck 03/21/2019 FINDINGS: Brain: No evidence of acute intracranial hemorrhage. No demarcated cortical infarction. No evidence of intracranial mass. No midline  shift or extra-axial fluid collection. Redemonstrated chronic infarcts within the left cerebral white matter. Background advanced chronic small vessel ischemic disease, also similar to prior exam. Mild generalized parenchymal atrophy. Vascular: No hyperdense vessel.  Atherosclerotic calcifications. Skull: Normal. Negative for fracture or focal lesion. Sinuses/Orbits: Visualized orbits demonstrate no acute abnormality. No significant paranasal sinus disease or mastoid effusion at the imaged levels. IMPRESSION: No CT evidence of acute intracranial abnormality. Generalized parenchymal atrophy with advanced chronic small vessel ischemic disease. Redemonstrated chronic infarcts within the left cerebral white matter. Electronically Signed   By: Kellie Simmering DO   On: 08/16/2019 19:07   MR ANGIO HEAD WO CONTRAST  Result Date: 08/17/2019 CLINICAL DATA:  Encephalopathy. Weakness. EXAM: MRI HEAD WITHOUT CONTRAST MRA HEAD WITHOUT CONTRAST TECHNIQUE: Multiplanar, multiecho pulse sequences of the brain and surrounding structures were obtained without intravenous contrast. Angiographic images of the head were obtained using MRA technique without contrast. COMPARISON:  Head  CT 08/16/2019, MRI 03/21/2019, and CTA 03/21/2019 FINDINGS: MRI HEAD FINDINGS The study is motion degraded throughout with some sequences being moderately to severely degraded. Brain: No acute infarct is identified, however sensitivity for detection of very small infarcts is reduced by the degree of motion present on today's study. Chronic left cerebral hemispheric white matter infarcts are again noted. Patchy to confluent T2 hyperintensities in the cerebral white matter bilaterally are similar to the prior MRI and nonspecific but compatible with severe chronic small vessel ischemic disease. A punctate chronic right cerebellar infarct is unchanged. Scattered chronic bilateral cerebral microhemorrhages were better demonstrated on the prior MRI. Mild cerebral  atrophy is within normal limits for age. No intracranial mass effect or extra-axial fluid collection is identified. Vascular: Major intracranial arterial flow voids are grossly preserved. Skull and upper cervical spine: No suspicious marrow lesion. Grade 1 anterolisthesis of C2 on C3. Moderate ligamentous thickening posterior to the dens without spinal cord mass effect. Sinuses/Orbits: Grossly unremarkable orbits. Minimal mucosal thickening in the paranasal sinuses. Clear mastoid air cells. Other: None. MRA HEAD FINDINGS The study is motion degraded. The visualized distal vertebral arteries are patent to the basilar. Diminished signal in the proximal and mid portions of the left V4 segment is suspected to be largely artifactual with normal signal distally, however an underlying stenosis is not excluded. Of note, there was no significant left V4 stenosis on the prior CTA. Patent PICAs are seen bilaterally. The basilar artery is widely patent. The left PCA is patent without evidence of flow limiting proximal stenosis. The right P1 segment is widely patent. Assessment of the right P2 segment is limited by diminished signal which is likely secondary to motion artifact. The internal carotid arteries are patent from skull base to carotid termini without evidence of flow limiting stenosis. Both A1 and left M1 segments are patent without evidence of flow limiting stenosis. Diminished signal through the left ACA origin is likely artifactual given normal appearance on the prior CTA. ACA and MCA branch vessel evaluation is limited by motion. There is signal loss throughout the M1 and M2 segments on the right with some flow related enhancement visible in more distal branch vessels. There is substantial motion artifact through this region, and there was no proximal MCA stenosis on the prior CTA. No large aneurysm is identified. IMPRESSION: 1. Motion degraded head MRI without evidence of acute intracranial abnormality. 2. Severe  chronic small vessel ischemic disease. 3. Motion degraded head MRA as above. Absent signal in the right MCA is likely artifactual given lack of a corresponding infarct and normal appearance on the prior CTA. If further evaluation is clinically warranted, CTA could be performed. Electronically Signed   By: Logan Bores M.D.   On: 08/17/2019 11:51   MR BRAIN WO CONTRAST  Result Date: 08/17/2019 CLINICAL DATA:  Encephalopathy. Weakness. EXAM: MRI HEAD WITHOUT CONTRAST MRA HEAD WITHOUT CONTRAST TECHNIQUE: Multiplanar, multiecho pulse sequences of the brain and surrounding structures were obtained without intravenous contrast. Angiographic images of the head were obtained using MRA technique without contrast. COMPARISON:  Head CT 08/16/2019, MRI 03/21/2019, and CTA 03/21/2019 FINDINGS: MRI HEAD FINDINGS The study is motion degraded throughout with some sequences being moderately to severely degraded. Brain: No acute infarct is identified, however sensitivity for detection of very small infarcts is reduced by the degree of motion present on today's study. Chronic left cerebral hemispheric white matter infarcts are again noted. Patchy to confluent T2 hyperintensities in the cerebral white matter bilaterally are similar to  the prior MRI and nonspecific but compatible with severe chronic small vessel ischemic disease. A punctate chronic right cerebellar infarct is unchanged. Scattered chronic bilateral cerebral microhemorrhages were better demonstrated on the prior MRI. Mild cerebral atrophy is within normal limits for age. No intracranial mass effect or extra-axial fluid collection is identified. Vascular: Major intracranial arterial flow voids are grossly preserved. Skull and upper cervical spine: No suspicious marrow lesion. Grade 1 anterolisthesis of C2 on C3. Moderate ligamentous thickening posterior to the dens without spinal cord mass effect. Sinuses/Orbits: Grossly unremarkable orbits. Minimal mucosal thickening  in the paranasal sinuses. Clear mastoid air cells. Other: None. MRA HEAD FINDINGS The study is motion degraded. The visualized distal vertebral arteries are patent to the basilar. Diminished signal in the proximal and mid portions of the left V4 segment is suspected to be largely artifactual with normal signal distally, however an underlying stenosis is not excluded. Of note, there was no significant left V4 stenosis on the prior CTA. Patent PICAs are seen bilaterally. The basilar artery is widely patent. The left PCA is patent without evidence of flow limiting proximal stenosis. The right P1 segment is widely patent. Assessment of the right P2 segment is limited by diminished signal which is likely secondary to motion artifact. The internal carotid arteries are patent from skull base to carotid termini without evidence of flow limiting stenosis. Both A1 and left M1 segments are patent without evidence of flow limiting stenosis. Diminished signal through the left ACA origin is likely artifactual given normal appearance on the prior CTA. ACA and MCA branch vessel evaluation is limited by motion. There is signal loss throughout the M1 and M2 segments on the right with some flow related enhancement visible in more distal branch vessels. There is substantial motion artifact through this region, and there was no proximal MCA stenosis on the prior CTA. No large aneurysm is identified. IMPRESSION: 1. Motion degraded head MRI without evidence of acute intracranial abnormality. 2. Severe chronic small vessel ischemic disease. 3. Motion degraded head MRA as above. Absent signal in the right MCA is likely artifactual given lack of a corresponding infarct and normal appearance on the prior CTA. If further evaluation is clinically warranted, CTA could be performed. Electronically Signed   By: Logan Bores M.D.   On: 08/17/2019 11:51   CARDIAC EVENT MONITOR  Result Date: 08/01/2019 SInus rhythm, sinus tachycardia   Rates 61 to  131 bpm   Occasional PVC Symptoms of fatigue did not correlate with any signif arrhythmia or extreme heart rates   Microbiology: Recent Results (from the past 240 hour(s))  Respiratory Panel by RT PCR (Flu A&B, Covid) - Nasopharyngeal Swab     Status: None   Collection Time: 08/16/19 10:11 PM   Specimen: Nasopharyngeal Swab  Result Value Ref Range Status   SARS Coronavirus 2 by RT PCR NEGATIVE NEGATIVE Final    Comment: (NOTE) SARS-CoV-2 target nucleic acids are NOT DETECTED. The SARS-CoV-2 RNA is generally detectable in upper respiratoy specimens during the acute phase of infection. The lowest concentration of SARS-CoV-2 viral copies this assay can detect is 131 copies/mL. A negative result does not preclude SARS-Cov-2 infection and should not be used as the sole basis for treatment or other patient management decisions. A negative result may occur with  improper specimen collection/handling, submission of specimen other than nasopharyngeal swab, presence of viral mutation(s) within the areas targeted by this assay, and inadequate number of viral copies (<131 copies/mL). A negative result must be combined  with clinical observations, patient history, and epidemiological information. The expected result is Negative. Fact Sheet for Patients:  PinkCheek.be Fact Sheet for Healthcare Providers:  GravelBags.it This test is not yet ap proved or cleared by the [place] FDA and  has been authorized for detection and/or diagnosis of SARS-CoV-2 by FDA under an Emergency Use Authorization (EUA). This EUA will remain  in effect (meaning this test can be used) for the duration of the COVID-19 declaration under Section 564(b)(1) of the Act, 21 U.S.C. section 360bbb-3(b)(1), unless the authorization is terminated or revoked sooner.    Influenza A by PCR NEGATIVE NEGATIVE Final   Influenza B by PCR NEGATIVE NEGATIVE Final    Comment:  (NOTE) The Xpert Xpress SARS-CoV-2/FLU/RSV assay is intended as an aid in  the diagnosis of influenza from Nasopharyngeal swab specimens and  should not be used as a sole basis for treatment. Nasal washings and  aspirates are unacceptable for Xpert Xpress SARS-CoV-2/FLU/RSV  testing. Fact Sheet for Patients: PinkCheek.be Fact Sheet for Healthcare Providers: GravelBags.it This test is not yet approved or cleared by the [place] FDA and  has been authorized for detection and/or diagnosis of SARS-CoV-2 by  FDA under an Emergency Use Authorization (EUA). This EUA will remain  in effect (meaning this test can be used) for the duration of the  Covid-19 declaration under Section 564(b)(1) of the Act, 21  U.S.C. section 360bbb-3(b)(1), unless the authorization is  terminated or revoked. Performed at [facility], [address] [place], [place] [address]   Culture, Urine     Status: Abnormal   Collection Time: 08/17/19  3:50 AM   Specimen: Urine, Clean Catch  Result Value Ref Range Status   Specimen Description   Final    URINE, CLEAN CATCH Performed at [facility], [address], [place], [place] [address]    Special Requests   Final    NONE Performed at [facility], [address], [place], [place] [address]    Culture >=100,000 COLONIES/mL ESCHERICHIA COLI (A)  Final   Report Status 08/19/2019 FINAL  Final   Organism ID, Bacteria ESCHERICHIA COLI (A)  Final      Susceptibility   Escherichia coli - MIC*    AMPICILLIN <=2 SENSITIVE Sensitive     CEFAZOLIN <=4 SENSITIVE Sensitive     CEFTRIAXONE <=0.25 SENSITIVE Sensitive     CIPROFLOXACIN <=0.25 SENSITIVE Sensitive     GENTAMICIN <=1 SENSITIVE Sensitive     IMIPENEM <=0.25 SENSITIVE Sensitive     NITROFURANTOIN <=16 SENSITIVE Sensitive     TRIMETH/SULFA <=20 SENSITIVE Sensitive     AMPICILLIN/SULBACTAM <=2 SENSITIVE Sensitive     PIP/TAZO <=4 SENSITIVE Sensitive      * >=100,000 COLONIES/mL ESCHERICHIA COLI     Labs: Basic Metabolic Panel: Recent Labs  Lab 08/16/19 1840 08/17/19 0451 08/18/19 0556 08/19/19 0605  NA 140 139 139 141  K 3.5 3.0* 3.5 3.7  CL 101 102 102 106  CO2 29 27 25 25   GLUCOSE 103* 120* 147* 166*  BUN 25* 19 9 10   CREATININE 1.03* 0.69 0.63 0.65  CALCIUM 9.5 8.4* 8.5* 8.5*  MG  --   --  1.5*  --    Liver Function Tests: Recent Labs  Lab 08/16/19 1840  AST 16  ALT 11  ALKPHOS 55  BILITOT 0.6  PROT 6.8  ALBUMIN 4.0    Recent Labs  Lab 08/17/19 0031  AMMONIA 11   CBC: Recent Labs  Lab 08/16/19 1840 08/17/19  0451 08/18/19 0556  WBC 7.8 5.9 6.2  NEUTROABS 5.4  --   --   HGB 13.9 13.4 14.2  HCT 42.2 40.5 42.3  MCV 90.4 90.4 88.3  PLT 239 209 244   BNP (last 3 results) Recent Labs    05/20/19 2053  BNP 47.0    CBG: Recent Labs  Lab 08/18/19 0730 08/18/19 1111 08/18/19 1624 08/18/19 2133 08/19/19 0716  GLUCAP 156* 211* 130* 111* 157*     Signed:  Barton Dubois MD.  [facility] 08/19/2019, 10:53 AM

## 2019-08-19 NOTE — Progress Notes (Signed)
Amy [name] office called back after patient was discharge to cancel appointment. They will not accept the patient due to being on pain medications.  TOC calling Amy [name]- daughter in law to advise. She had been look for her a new PCP.

## 2019-08-19 NOTE — TOC Transition Note (Addendum)
Transition of Care [facility]) - CM/SW Discharge Note   Patient Details  Name: Amy [name] MRN: [id number] Date of Birth: 1935/08/27  Transition of Care [facility]) CM/SW Contact:  Boneta Lucks, RN Phone Number: 08/19/2019, 11:40 AM   Clinical Narrative:   Patient admitted for Acute encephalopathy. Patient also has a high readmission score. Patient was in rehab last year. When home with [place].  Patient continues to live alone, family checking in on her.  She walked 50 feet with PT.  They are recommending HHPT, Spoke with Amy [name]- daughter in law, they had a private paid aide that quit, she is requesting [facility] and add an Aide.  CM Spoke the [facility], she accepted the referral for HHPT/ Aide. It will be Monday for first visit. Amy [name] updated, She will pick the patient up at 2:00 today. MD and RN updated.   Addendum: MD ordering B-12 shots. CM adding RN to home health orders. Romualdo Bolk with [facility] accepted.     Barriers to Discharge: Barriers Resolved   HH Arranged: PT, Nurse's Aide [place] Agency: [place] ([facility]) Date [place]: 08/19/19 Time [place]: 1000 Representative spoke with at [place]: [place] Arrangements/Services   Lives with:: Self   Do you feel safe going back to the place where you live?: Yes      Need for Family Participation in Patient Care: Yes (Comment) Care giver support system in place?: Yes (comment) Current home services: DME Criminal Activity/Legal Involvement Pertinent to Current Situation/Hospitalization: No - Comment as needed  Activities of Daily Living Home Assistive Devices/Equipment: Eyeglasses, Gilford Rile (specify type) ADL Screening (condition at time of admission) Patient's cognitive ability adequate to safely complete daily activities?: Yes Is the patient deaf or have difficulty hearing?: No Does the patient have difficulty seeing, even when wearing  glasses/contacts?: No Does the patient have difficulty concentrating, remembering, or making decisions?: Yes Patient able to express need for assistance with ADLs?: Yes Does the patient have difficulty dressing or bathing?: No Independently performs ADLs?: Yes (appropriate for developmental age) Does the patient have difficulty walking or climbing stairs?: Yes Weakness of Legs: Both Weakness of Arms/Hands: Right  Permission Sought/Granted      Share Information with NAME: Amy [name]     Permission granted to share info w Relationship: Daughter in [place]     Emotional Assessment     Affect (typically observed): Accepting Orientation: : Oriented to Self, Oriented to Situation, Oriented to Place Alcohol / Substance Use: Not Applicable Psych Involvement: No (comment)  Admission diagnosis:  Dehydration [E86.0] Acute encephalopathy [G93.40] Altered mental status, unspecified altered mental status type [id number] Acute metabolic encephalopathy [id number] Patient Active Problem List   Diagnosis Date Noted  . Dehydration   . Major depressive disorder, recurrent episode, mild with anxious distress ([place])   . Acute lower UTI   . Deficiency of vitamin B12   . Physical deconditioning   . Acute metabolic encephalopathy 0000000  . Acute encephalopathy 08/16/2019  . Mild renal insufficiency 08/16/2019  . Chronic systolic CHF (congestive heart failure) ([place]) 08/16/2019  . Cardiac LV ejection fraction 30-35% 03/29/2019  . Dyslipidemia associated with type 2 diabetes mellitus ([place]) 03/29/2019  . Major depression with psychotic features ([place]) 03/29/2019  . History of CVA (cerebrovascular accident) 03/20/2019  . Edema of both lower legs due to peripheral venous insufficiency 10/12/2018  . Anemia 05/12/2018  . S/P total knee replacement, right 05/05/18 05/12/2018  . Type 2 diabetes mellitus  with neurological complications ([place]) [id number]  . Primary osteoarthritis of right knee   . Status post  surgery 01/26/2018  . Surgery, elective 01/26/2018  . Degenerative spondylolisthesis 01/26/2018  . Osteoporosis 12/13/2016  . Osteopenia 11/26/2016  . Invasive ductal carcinoma of breast, female, right ([place])   . Lumbago 03/22/2014  . Stiffness of joint, not elsewhere classified, pelvic region and thigh 03/22/2014  . Muscle weakness (generalized) 03/22/2014  . Abnormality of gait 03/22/2014  . Depression with anxiety 09/03/2012  . Insomnia secondary to depression with anxiety 09/03/2012  . Pain 09/03/2012   PCP:  Perlie Mayo, NP Pharmacy:   [facility], [address] AT [address]. [place] [address] Phone: [phone] Fax: [phone]   Readmission Risk Interventions Readmission Risk Prevention Plan 08/19/2019  Transportation Screening Complete  PCP or Specialist Appt within 3-5 Days Not Complete  HRI or Home Care Consult Complete  Social Work Consult for [place] Planning/Counseling Complete  Palliative Care Screening Not Complete  Medication Review Press photographer) Complete  Some recent data might be hidden

## 2019-08-20 ENCOUNTER — Ambulatory Visit: Payer: Medicare HMO | Admitting: Internal Medicine

## 2019-08-23 ENCOUNTER — Telehealth: Payer: Self-pay | Admitting: Internal Medicine

## 2019-08-23 ENCOUNTER — Telehealth: Payer: Self-pay | Admitting: Neurology

## 2019-08-23 NOTE — Telephone Encounter (Addendum)
Amy [name] from [place] home [facility] at [facility] [phone]. She stated pt was discharge last week from hospital with a UTI Acute encephalopathy. Pts PCP Dr. Luan Pulling retired and pt has not establish with a new PCP. The son has paperwork for a new PCP but has not turn them in to establish an appt per [facility]. She stated the cardiologist did not want to sign for all the pts orders.I stated pt was not seen by Dr Leonie Man at the hospital and she was discharge with UTI and encephalopathy.She wanted orders for Skilled nursing, home health, social worker, b12 injections. Pt has Dm and CHF. I stated message will be sent to Dr.SEthi for review. I stated he only manages pts stroke condition and cannot be pts PCP.I stated message will be sent to him for review. She verbalized understanding.

## 2019-08-23 NOTE — Telephone Encounter (Signed)
Amy [name] from [facility] called wanting to know if provider will sign of on these VO due to the pt's PCP retiring and she has yet to establish with a new PCP.  Skilled Nursing 4 times for 1 week and then 1 time for 4 weeks Home health 2 times for 4 weeks Pt has CHS, Diabetes and B12 injections She is needing OT social work evaluation   Please advise.

## 2019-08-23 NOTE — Telephone Encounter (Signed)
Revised. 

## 2019-08-23 NOTE — Telephone Encounter (Signed)
Called and spoke with Larene Beach from [facility] and adv that patient has not established with Dr. [name] and has an upcoming appointment with her in a few weeks. Also adv that typically cardiology does not sign HH orders and her PCP would be best (Dr. Luan Pulling).  Per the patient, her PCP has retired and she has not established with a new one yet.   She is going to check with the hospitalist for assistance.

## 2019-08-23 NOTE — Telephone Encounter (Signed)
New Message:  She needs Verbal Orders for [place] for 4 times a week for 1 week and 1 time a week for 4 weeks. This is for CHF, Diabetes and B 12 injection. She also need a [place] for 2 times a week for 4 weeks. She needs a Paediatric nurse. She needs a Hospital Bed,  She need a written order for this. Please send order to [facility] Fax# [phone].

## 2019-08-24 NOTE — Telephone Encounter (Signed)
I returned the call to Laverle Hobby at [facility]. She has been informed of Dr. Clydene Fake response and verbalized understanding.

## 2019-08-24 NOTE — Telephone Encounter (Signed)
I believe these orders need tobe placed by pt`s PCP or cardiologist as these are not neuro related

## 2019-08-25 ENCOUNTER — Other Ambulatory Visit (HOSPITAL_COMMUNITY): Payer: Self-pay | Admitting: Pulmonary Disease

## 2019-08-25 DIAGNOSIS — C50919 Malignant neoplasm of unspecified site of unspecified female breast: Secondary | ICD-10-CM

## 2019-08-26 ENCOUNTER — Telehealth: Payer: Self-pay | Admitting: Internal Medicine

## 2019-08-26 NOTE — Telephone Encounter (Signed)
Called back and spoke with [facility].she is going in for OT. Explained this patient has not been seen by Dr. [name] and has an appointment 09/20/19; Adv that the nurse I spoke with last week was going to try to reach out to the hospitalist re: orders.

## 2019-08-26 NOTE — Telephone Encounter (Signed)
Kat from [facility] is calling requesting a verbal order to care for the patient. She states she would like to see the patient once a week for a week. Then see her two times a week for three weeks, and once a week again for three weeks. She states if she is unable to answer the phone you can give the verbal order on her voicemail. Please advise.

## 2019-08-30 ENCOUNTER — Telehealth (HOSPITAL_COMMUNITY): Payer: Self-pay | Admitting: *Deleted

## 2019-08-30 NOTE — Telephone Encounter (Signed)
SON CALLED CONCERNED ABOUT SOME BEHAVIORS SINCE COMING HOME FROM HOSPITAL, TALKING ABOUT DIFFERENT PEOPLE CALLING HER LIKE HER SON & DAUGHTER IN [place] & IT'S A DISCONNECT CONVERSATION NOT REALIZING SHE'S TALKING ABOUT HIM , TALKING ABOUT HER HOME LIKE IT'S SOME WHERE OTHER THAN WHERE SHE IS. ADAMANT ABOUT NOT GOING TO A SKILLED NURSING FACILITY.

## 2019-08-31 ENCOUNTER — Ambulatory Visit: Payer: Medicare HMO | Admitting: Adult Health

## 2019-08-31 ENCOUNTER — Other Ambulatory Visit (HOSPITAL_COMMUNITY): Payer: Self-pay | Admitting: Psychiatry

## 2019-08-31 DIAGNOSIS — F330 Major depressive disorder, recurrent, mild: Secondary | ICD-10-CM

## 2019-08-31 MED ORDER — ALPRAZOLAM 0.5 MG PO TABS: 0.5000 mg | ORAL_TABLET | Freq: Two times a day (BID) | ORAL | 2 refills | Status: DC | PRN

## 2019-08-31 NOTE — Telephone Encounter (Signed)
We need to set up 30 min appointment with both of them present

## 2019-08-31 NOTE — Telephone Encounter (Signed)
Per Provider : We need to set up 30 min appointment with both of them present. Spoke with patient's son & asked to check his work calendar  & call when available for a 30 min appt  with the provider. Roderic Palau said he will do that & call us back

## 2019-09-01 ENCOUNTER — Ambulatory Visit (INDEPENDENT_AMBULATORY_CARE_PROVIDER_SITE_OTHER): Payer: Medicare HMO | Admitting: Psychiatry

## 2019-09-01 ENCOUNTER — Other Ambulatory Visit: Payer: Self-pay

## 2019-09-01 ENCOUNTER — Encounter (HOSPITAL_COMMUNITY): Payer: Self-pay | Admitting: Psychiatry

## 2019-09-01 DIAGNOSIS — F330 Major depressive disorder, recurrent, mild: Secondary | ICD-10-CM

## 2019-09-01 MED ORDER — ESCITALOPRAM OXALATE 20 MG PO TABS: 20.0000 mg | ORAL_TABLET | Freq: Every day | ORAL | 2 refills | Status: DC

## 2019-09-01 MED ORDER — RISPERIDONE 1 MG PO TABS: 1.0000 mg | ORAL_TABLET | Freq: Every day | ORAL | 2 refills | Status: DC

## 2019-09-01 MED ORDER — TRAZODONE HCL 50 MG PO TABS: 50.0000 mg | ORAL_TABLET | Freq: Every day | ORAL | 2 refills | Status: DC

## 2019-09-01 MED ORDER — ALPRAZOLAM 0.5 MG PO TABS: 0.5000 mg | ORAL_TABLET | Freq: Every day | ORAL | 2 refills | Status: DC

## 2019-09-01 MED ORDER — VENLAFAXINE HCL ER 150 MG PO CP24: 150.0000 mg | ORAL_CAPSULE | Freq: Two times a day (BID) | ORAL | 2 refills | Status: DC

## 2019-09-01 MED ORDER — GABAPENTIN 300 MG PO CAPS: 300.0000 mg | ORAL_CAPSULE | Freq: Every day | ORAL | 2 refills | Status: DC

## 2019-09-01 NOTE — Progress Notes (Signed)
Virtual Visit via Telephone Note  I connected with Amy [name] on 09/01/19 at  4:20 PM EST by telephone and verified that I am speaking with the correct person using two identifiers.   I discussed the limitations, risks, security and privacy concerns of performing an evaluation and management service by telephone and the availability of in person appointments. I also discussed with the patient that there may be a patient responsible charge related to this service. The patient expressed understanding and agreed to proceed.    I discussed the assessment and treatment plan with the patient. The patient was provided an opportunity to ask questions and all were answered. The patient agreed with the plan and demonstrated an understanding of the instructions.   The patient was advised to call back or seek an in-person evaluation if the symptoms worsen or if the condition fails to improve as anticipated.  I provided 15 minutes of non-face-to-face time during this encounter.   Levonne Spiller, MD  [facility] MD/PA/NP OP Progress Note  09/01/2019 4:49 PM Amy [name]  MRN:  [id number]  Chief Complaint:  Chief Complaint    Anxiety; Depression; Memory Loss     HPI: Patient is an 84 year old divorced white female who lives alone in [place].  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  She has 2 grown adopted children and 2 grandchildren.  The patient returns for follow-up after 3 months.  She has a history of depression and anxiety.  However lately her mental status has declined and she has been having more memory loss.  Unfortunately she was hospitalized on 08/16/2019 for altered mental status and acute confusion.  She was found to have a UTI and low B12.  She was also somewhat dehydrated.  Her son and called to inform me of this and is called again stating that she seems more confused at times since the hospitalization.  She did have a brain MRI which showed some diffuse cerebral  infarcts but nothing new.  However they are quite widespread and may explain her worsening confusion and dementia.  She has had several falls in the night and her son is going to try to find someone to stay with her at night.  Right now she is getting home health nursing OT and PT but no one who is there constantly to supervise her.  He is talked to her about going into a facility but she adamantly refuses at this time.  When I spoke with her she seemed alert and oriented.  I did have to repeat myself several times for her to understand things.  She knows where she is and who she is with.  Her son reports that at times she does not know who he is thinks that he is someone else and does not know where she is.  She is seeing a neurologist next week and I suggested they discuss the addition of something like Aricept or Namenda.  I am not sure how well this would work in someone with vascular dementia but I will leave this up to neurology.  She states that she is not depressed or suicidal and is not significantly anxious.  She takes a 0.5 mg Xanax at bedtime and she seems to be sleeping pretty well.  As I have explained to the son this is probably a deteriorating condition and dementia obviously is not curable but we will try to improve her quality of life is much as we  can. Visit Diagnosis:    ICD-10-CM   1. Major depressive disorder, recurrent episode, mild with anxious distress (HCC)  F33.0 ALPRAZolam (XANAX) 0.5 MG tablet    Past Psychiatric History: Psychiatric hospitalization in her 85s, since then outpatient treatment  Past Medical History:  Past Medical History:  Diagnosis Date  . Anxiety   . Arthritis   . Back pain   . Colon polyps   . Depression   . Diabetes mellitus, type II ([place])    diet controlled  . Hypercholesterolemia   . Invasive ductal carcinoma of breast, female, right ([place])   . Osteopenia 11/26/2016  . Skin-picking disorder    has 3 open wounds on right wrist that are being  treated. About a quarter in size.  . Stroke [facility])     Past Surgical History:  Procedure Laterality Date  . APPENDECTOMY    . BACK SURGERY    . BREAST BIOPSY Right 10/09/2016   Procedure: BREAST BIOPSY WITH NEEDLE LOCALIZATION;  Surgeon: [name], MD;  Location: [facility]S;  Service: General;  Laterality: Right;  . BREAST SURGERY Right   . BUBBLE STUDY  03/23/2019   Procedure: BUBBLE STUDY;  Surgeon: [name], MD;  Location: [facility] ENDOSCOPY;  Service: Cardiovascular;;  . BUNIONECTOMY Bilateral   . FACIAL COSMETIC SURGERY     drooping eyelid and brow  . HERNIA REPAIR Right    Inguinal x2  . KNEE ARTHROSCOPY Right   . SHOULDER SURGERY Right    rotator cuff repair and removal of bone spur  . TEE WITHOUT CARDIOVERSION N/A 03/23/2019   Procedure: TRANSESOPHAGEAL ECHOCARDIOGRAM (TEE);  Surgeon: [name], MD;  Location: [facility] ENDOSCOPY;  Service: Cardiovascular;  Laterality: N/A;  . TEMPOROMANDIBULAR JOINT SURGERY Right   . TOTAL KNEE ARTHROPLASTY Right 05/05/2018   Procedure: TOTAL KNEE ARTHROPLASTY;  Surgeon: [name], MD;  Location: [facility] ORS;  Service: Orthopedics;  Laterality: Right;    Family Psychiatric History: see below  Family History:  Family History  Problem Relation Age of Onset  . Anxiety disorder Mother   . Depression Mother   . Depression Maternal Grandmother   . Alcohol abuse Brother   . Cancer - Other Sister   . ADD / ADHD Neg Hx   . Bipolar disorder Neg Hx   . Dementia Neg Hx   . Drug abuse Neg Hx   . OCD Neg Hx   . Paranoid behavior Neg Hx   . Schizophrenia Neg Hx   . Seizures Neg Hx   . Sexual abuse Neg Hx   . Physical abuse Neg Hx     Social History:  Social History   Socioeconomic History  . Marital status: Divorced    Spouse name: Not on file  . Number of children: Not on file  . Years of education: Not on file  . Highest education level: Not on file  Occupational History  . Occupation: Nurse    Comment: Retired  Tobacco  Use  . Smoking status: Former Smoker    Packs/day: 0.50    Years: 40.00    Pack years: 20.00    Types: Cigarettes    Quit date: 09/04/1995    Years since quitting: 24.0  . Smokeless tobacco: Never Used  Substance and Sexual Activity  . Alcohol use: No  . Drug use: No    Types: Benzodiazepines, Hydrocodone  . Sexual activity: Never  Other Topics Concern  . Not on file  Social History Narrative   Divorced.  Former smoker, quit in 1997.  Nondrinker.    Social Determinants of Health   Financial Resource Strain:   . Difficulty of Paying Living Expenses: Not on file  Food Insecurity:   . Worried About Charity fundraiser in the Last Year: Not on file  . Ran Out of Food in the Last Year: Not on file  Transportation Needs:   . Lack of Transportation (Medical): Not on file  . Lack of Transportation (Non-Medical): Not on file  Physical Activity:   . Days of Exercise per Week: Not on file  . Minutes of Exercise per Session: Not on file  Stress:   . Feeling of Stress : Not on file  Social Connections:   . Frequency of Communication with Friends and Family: Not on file  . Frequency of Social Gatherings with Friends and Family: Not on file  . Attends Religious Services: Not on file  . Active Member of Clubs or Organizations: Not on file  . Attends Archivist Meetings: Not on file  . Marital Status: Not on file    Allergies:  Allergies  Allergen Reactions  . Cymbalta [Duloxetine Hcl] Other (See Comments)    Hallucinated and couldn't think; got worse and worse the longer she took this.    Metabolic Disorder Labs: Lab Results  Component Value Date   HGBA1C 6.3 (H) 08/17/2019   MPG 134.11 08/17/2019   MPG 134.11 03/21/2019   No results found for: PROLACTIN Lab Results  Component Value Date   CHOL 172 05/06/2019   TRIG 203 (H) 05/06/2019   HDL 83 05/06/2019   CHOLHDL 2.1 05/06/2019   VLDL 35 03/21/2019   LDLCALC 57 05/06/2019   LDLCALC 153 (H) 03/21/2019   Lab  Results  Component Value Date   TSH 1.969 08/17/2019    Therapeutic Level Labs: No results found for: LITHIUM No results found for: VALPROATE No components found for:  CBMZ  Current Medications: Current Outpatient Medications  Medication Sig Dispense Refill  . ALPRAZolam (XANAX) 0.5 MG tablet Take 1 tablet (0.5 mg total) by mouth at bedtime. 30 tablet 2  . aspirin EC 81 MG tablet Take 81 mg by mouth every morning.    Marland Kitchen atorvastatin (LIPITOR) 40 MG tablet Take 1 tablet (40 mg total) by mouth daily at 6 PM. 30 tablet 0  . calcium carbonate (OSCAL) 1500 (600 Ca) MG TABS tablet Take 600 mg of elemental calcium by mouth daily with supper.     . carvedilol (COREG) 3.125 MG tablet Take 1 tablet (3.125 mg total) by mouth 2 (two) times daily. 180 tablet 3  . cholecalciferol (VITAMIN D) 1000 units tablet Take 1 tablet (1,000 Units total) by mouth daily. 30 tablet 11  . cyanocobalamin (,VITAMIN B-12,) 1000 MCG/ML injection Daily for 4 days; then weekly for 1 month and then monthly after that. 12 mL 0  . escitalopram (LEXAPRO) 20 MG tablet Take 1 tablet (20 mg total) by mouth daily. 30 tablet 2  . gabapentin (NEURONTIN) 300 MG capsule Take 1 capsule (300 mg total) by mouth at bedtime. 30 capsule 2  . letrozole (FEMARA) 2.5 MG tablet TAKE 1 TABLET(2.5 MG) BY MOUTH DAILY (Patient taking differently: Take 2.5 mg by mouth daily. ) 90 tablet 3  . losartan (COZAAR) 25 MG tablet Take 0.5 tablets (12.5 mg total) by mouth at bedtime. 45 tablet 3  . metFORMIN (GLUCOPHAGE) 500 MG tablet Take 1 tablet (500 mg total) by mouth 2 (two) times daily with a  meal. 60 tablet 0  . risperiDONE (RISPERDAL) 1 MG tablet Take 1 tablet (1 mg total) by mouth at bedtime. 30 tablet 2  . traZODone (DESYREL) 50 MG tablet Take 1 tablet (50 mg total) by mouth at bedtime. 30 tablet 2  . venlafaxine XR (EFFEXOR-XR) 150 MG 24 hr capsule Take 1 capsule (150 mg total) by mouth 2 (two) times daily. 60 capsule 2   No current  facility-administered medications for this visit.     Musculoskeletal: Strength & Muscle Tone: decreased Gait & Station: unsteady Patient leans: N/A  Psychiatric Specialty Exam: Review of Systems  Musculoskeletal: Positive for arthralgias, back pain and gait problem.  Neurological: Positive for light-headedness.  Psychiatric/Behavioral: Positive for decreased concentration.  All other systems reviewed and are negative.   There were no vitals taken for this visit.There is no height or weight on file to calculate BMI.  General Appearance: NA  Eye Contact:  NA  Speech:  Clear and Coherent  Volume:  Normal  Mood:  Anxious  Affect:  NA  Thought Process:  Goal Directed  Orientation:  Full (Time, Place, and Person)  Thought Content: Tangential   Suicidal Thoughts:  No  Homicidal Thoughts:  No  Memory:  Immediate;   Fair Recent;   Poor Remote;   Poor  Judgement:  Poor  Insight:  Shallow  Psychomotor Activity:  Decreased  Concentration:  Concentration: Fair and Attention Span: Fair  Recall:  [employer] of Knowledge: Fair  Language: Good  Akathisia:  No  Handed:  Right  AIMS (if indicated): not done  Assets:  Communication Skills Desire for Improvement Resilience Social Support  ADL's:  Intact  Cognition: Impaired,  Mild  Sleep:  Good   Screenings: MDI     Office Visit from 02/15/2016 in [facility]  Total Score (max 50)  17       Assessment and Plan: This patient is an 84 year old female with a history of depression and anxiety.  Unfortunately her cognition is declining probably due to vascular dementia from numerous strokes.  She seems fairly intact today particularly with cues from her son.  I do think she needs as much supervision as they can afford to prevent further falls or other mishaps.  She will continue Lexapro 20 mg daily for depression as well as Effexor XR 150 mg p.o. twice daily trazodone 50 mg at bedtime for  sleep, Resporal 1 mg at bedtime for paranoid ideation and Xanax 0.5 mg daily at bedtime as needed for anxiety.  I will leave the decision about medications for memory loss of to the neurologist at present.  She will return to see me in 6 weeks and her son may call sooner as needed   Levonne Spiller, MD 09/01/2019, 4:49 PM

## 2019-09-06 ENCOUNTER — Ambulatory Visit: Payer: Medicare HMO | Admitting: Adult Health

## 2019-09-06 ENCOUNTER — Encounter: Payer: Self-pay | Admitting: Adult Health

## 2019-09-06 ENCOUNTER — Encounter: Payer: Self-pay | Admitting: *Deleted

## 2019-09-06 ENCOUNTER — Other Ambulatory Visit: Payer: Self-pay

## 2019-09-06 VITALS — BP 134/88 | HR 81 | Temp 97.1°F | Ht 64.0 in | Wt 163.2 lb

## 2019-09-06 DIAGNOSIS — I10 Essential (primary) hypertension: Secondary | ICD-10-CM

## 2019-09-06 DIAGNOSIS — I639 Cerebral infarction, unspecified: Secondary | ICD-10-CM | POA: Diagnosis not present

## 2019-09-06 DIAGNOSIS — E1142 Type 2 diabetes mellitus with diabetic polyneuropathy: Secondary | ICD-10-CM | POA: Diagnosis not present

## 2019-09-06 DIAGNOSIS — E785 Hyperlipidemia, unspecified: Secondary | ICD-10-CM | POA: Diagnosis not present

## 2019-09-06 DIAGNOSIS — I69319 Unspecified symptoms and signs involving cognitive functions following cerebral infarction: Secondary | ICD-10-CM

## 2019-09-06 MED ORDER — ATORVASTATIN CALCIUM 40 MG PO TABS: 40.0000 mg | ORAL_TABLET | Freq: Every day | ORAL | 2 refills | Status: DC

## 2019-09-06 NOTE — Patient Instructions (Signed)
Continue aspirin 81 mg daily  and lipitor  for secondary stroke prevention  Continue to follow up with PCP regarding cholesterol, blood pressure and diabetes management   We will check memory at todays visit - if your memory should worsen, please let us know  We will send a letter to Dr. Trenton Gammon regarding potential back surgery and clearance to pursue procedure from a neurological standpoint   Continue to monitor blood pressure at home  Maintain strict control of hypertension with blood pressure goal below 130/90, diabetes with hemoglobin A1c goal below 6.5% and cholesterol with LDL cholesterol (bad cholesterol) goal below 70 mg/dL. I also advised the patient to eat a healthy diet with plenty of whole grains, cereals, fruits and vegetables, exercise regularly and maintain ideal body weight.  Followup in the future with me in 6 months or call earlier if needed       Thank you for coming to see [place] at [facility]. I hope we have been able to provide you high quality care today.  You may receive a patient satisfaction survey over the next few weeks. We would appreciate your feedback and comments so that we may continue to improve ourselves and the health of our patients.

## 2019-09-06 NOTE — Progress Notes (Addendum)
[facility] [address] [place]. [place] [address] [phone]       OFFICE FOLLOW UP NOTE  Amy [name] Date of Birth:  Aug 24, 1935 Medical Record Number:  [id number]   Referring physician Dr. Rosalin Hawking Reason for referral stroke  Chief Complaint  Patient presents with  . Follow-up    Tx RM. with daughter in law. possible white areas apperant on MRI per daughter in law. clearence for back surgery.     HPI:  Initial visit 05/06/2019 Dr. Leonie Man: Amy [name] is a 84 year old Caucasian lady seen today for initial office consultation visit.  She is accompanied by her sitter.  History is obtained from her and review of electronic medical records.  I personally reviewed imaging films in PACS.  She is 84 year old pleasant Caucasian lady with past medical history of hyperlipidemia, diabetes, hypertension, right breast cancer stage Ia status post surgery and on chemotherapy with Femara, anxiety, depression, osteopenia who presented to [facility] emergency room on 03/20/2019 after she was found on the floor with right-sided weakness.  Patient son noted that she had right arm weakness and had trouble walking and was dragging her right leg to.  CT scan of the head showed no acute infarct.  She was not given TPA due to late presentation.  MRI scan of the brain showed small acute infarcts involving left precentral gyrus and left superior parietal lobe which were felt to be embolic.  CT angiogram of the brain and neck both did not show significant large vessel stenosis or occlusion.  2D echo showed diminished ejection fraction of 35 to 40% with diffuse hypokinesis but no clot.  TEE showed no evidence of clot but did show severe aortic arch atheromatous and small PFO.  Outpatient 30-day heart monitoring was recommended but it has not been done but she does have an appointment to see a cardiologist next week for the same I presume.  LDL cholesterol was elevated at 153 mg  percent and hemoglobin A1c was 6.3.  Urine drug screen was negative.  She was started on aspirin and Plavix for 3 weeks and she states she is tolerated well without major bleeding but does bruise easily.  She has been getting home physical occupational therapy and has obtain improvement in the right hand strength though dexterity is still not back to normal and fine motor skills are diminished.  She is able to walk with a walker but does favor her right leg due to knee pain and uses a walker and has had no falls.  Her blood pressures well controlled today it is 129/62.  Her fasting sugars have all ranged in the 120s.  She has tolerated Lipitor 40 mg daily but has not had follow-up lipid profile checked yet.  She lives alone her son lives nearby.  Update 09/06/2019: Amy [name] is a 84 year old female who is being seen today for stroke follow-up accompanied by her daughter. Residual deficits of expressive aphasia, mild cognitive impairment and mild right hand weakness with some improvement.  Cognitive impairment consists of waxing/waning of symptoms with at times has difficulty identifying family members with increased confusion and other times completely aware and oriented.  Her greatest concern at today's visit is regarding clearance to pursue back surgery with Dr. Trenton Gammon.  She states ongoing back pain has been greatly limiting mobility and daily activity but needs clearance from neurology and cardiology prior to proceeding.  She has continued on aspirin 81 mg daily and atorvastatin 40 mg daily  for secondary stroke prevention without.  Blood pressure 134/88.  Completed cardiac event monitor which was negative for atrial fibrillation.  Recently evaluated in the ED on 08/15/2018 due to transient episodes of confusion.  Diagnosed with acute encephalopathy likely metabolic-multifactorial with finding of acute UTI and B12 deficiency.  MRI negative for acute finding.  She has recovered well and returned back to baseline.   She continues to follow with psychiatry for underlying depression/anxiety and per daughter, psychiatry recommended speaking with our office on today's visit regarding possible initiation of a medication to help prevent dementia due to prior strokes on imaging and white matter disease.  Daughter requesting MMSE at today's visit as this was also recommended during recent hospitalization.  MMSE today 22/30.  She appears to be cognitively intact at today's visit providing majority of information as well as providing information regarding clearance for possible back surgery.  She will be establishing care with PCP next week with Dr. Maudie Mercury in [place], [place] as her prior PCP has retired.  No further concerns at this time.     ROS:   14 system review of systems is positive for hand weakness, speech impairment, neck pain, memory loss, depression, anxiety and all other systems negative   PMH:  Past Medical History:  Diagnosis Date  . Anxiety   . Arthritis   . Back pain   . Colon polyps   . Depression   . Diabetes mellitus, type II ([place])    diet controlled  . Hypercholesterolemia   . Invasive ductal carcinoma of breast, female, right ([place])   . Osteopenia 11/26/2016  . Skin-picking disorder    has 3 open wounds on right wrist that are being treated. About a quarter in size.  . Stroke [facility])     Social History:  Social History   Socioeconomic History  . Marital status: Divorced    Spouse name: Not on file  . Number of children: Not on file  . Years of education: Not on file  . Highest education level: Not on file  Occupational History  . Occupation: Nurse    Comment: Retired  Tobacco Use  . Smoking status: Former Smoker    Packs/day: 0.50    Years: 40.00    Pack years: 20.00    Types: Cigarettes    Quit date: 09/04/1995    Years since quitting: 24.0  . Smokeless tobacco: Never Used  Substance and Sexual Activity  . Alcohol use: No  . Drug use: No    Types: Benzodiazepines, Hydrocodone   . Sexual activity: Never  Other Topics Concern  . Not on file  Social History Narrative   Divorced.  Former smoker, quit in 1997.  Nondrinker.    Social Determinants of Health   Financial Resource Strain:   . Difficulty of Paying Living Expenses: Not on file  Food Insecurity:   . Worried About Charity fundraiser in the Last Year: Not on file  . Ran Out of Food in the Last Year: Not on file  Transportation Needs:   . Lack of Transportation (Medical): Not on file  . Lack of Transportation (Non-Medical): Not on file  Physical Activity:   . Days of Exercise per Week: Not on file  . Minutes of Exercise per Session: Not on file  Stress:   . Feeling of Stress : Not on file  Social Connections:   . Frequency of Communication with Friends and Family: Not on file  . Frequency of Social Gatherings with Friends  and Family: Not on file  . Attends Religious Services: Not on file  . Active Member of Clubs or Organizations: Not on file  . Attends Archivist Meetings: Not on file  . Marital Status: Not on file  Intimate Partner Violence:   . Fear of Current or Ex-Partner: Not on file  . Emotionally Abused: Not on file  . Physically Abused: Not on file  . Sexually Abused: Not on file    Medications:   Current Outpatient Medications on File Prior to Visit  Medication Sig Dispense Refill  . ALPRAZolam (XANAX) 0.5 MG tablet Take 1 tablet (0.5 mg total) by mouth at bedtime. 30 tablet 2  . aspirin EC 81 MG tablet Take 81 mg by mouth every morning.    . calcium carbonate (OSCAL) 1500 (600 Ca) MG TABS tablet Take 600 mg of elemental calcium by mouth daily with supper.     . cholecalciferol (VITAMIN D) 1000 units tablet Take 1 tablet (1,000 Units total) by mouth daily. 30 tablet 11  . cyanocobalamin (,VITAMIN B-12,) 1000 MCG/ML injection Daily for 4 days; then weekly for 1 month and then monthly after that. 12 mL 0  . escitalopram (LEXAPRO) 20 MG tablet Take 1 tablet (20 mg total) by  mouth daily. 30 tablet 2  . gabapentin (NEURONTIN) 300 MG capsule Take 1 capsule (300 mg total) by mouth at bedtime. 30 capsule 2  . letrozole (FEMARA) 2.5 MG tablet TAKE 1 TABLET(2.5 MG) BY MOUTH DAILY (Patient taking differently: Take 2.5 mg by mouth daily. ) 90 tablet 3  . metFORMIN (GLUCOPHAGE) 500 MG tablet Take 1 tablet (500 mg total) by mouth 2 (two) times daily with a meal. 60 tablet 0  . risperiDONE (RISPERDAL) 1 MG tablet Take 1 tablet (1 mg total) by mouth at bedtime. 30 tablet 2  . traZODone (DESYREL) 50 MG tablet Take 1 tablet (50 mg total) by mouth at bedtime. 30 tablet 2  . venlafaxine XR (EFFEXOR-XR) 150 MG 24 hr capsule Take 1 capsule (150 mg total) by mouth 2 (two) times daily. 60 capsule 2  . atorvastatin (LIPITOR) 40 MG tablet Take 1 tablet (40 mg total) by mouth daily at 6 PM. 30 tablet 0  . carvedilol (COREG) 3.125 MG tablet Take 1 tablet (3.125 mg total) by mouth 2 (two) times daily. 180 tablet 3  . losartan (COZAAR) 25 MG tablet Take 0.5 tablets (12.5 mg total) by mouth at bedtime. 45 tablet 3   No current facility-administered medications on file prior to visit.    Allergies:   Allergies  Allergen Reactions  . Cymbalta [Duloxetine Hcl] Other (See Comments)    Hallucinated and couldn't think; got worse and worse the longer she took this.    Physical Exam  Today's Vitals   09/06/19 1534  BP: 134/88  Pulse: 81  Temp: (!) 97.1 F (36.2 C)  Weight: 163 lb 3.2 oz (74 kg)  Height: 5\' 4"  (1.626 m)   Body mass index is 28.01 kg/m.   General: Frail very pleasant elderly Caucasian lady seated, in no evident distress Head: head normocephalic and atraumatic.  Neck: supple with no carotid or supraclavicular bruits Cardiovascular: regular rate and rhythm, no murmurs Musculoskeletal: Mild kyphoscoliosis Skin:  no rash/petichiae Vascular:  Normal pulses all extremities  Neurologic Exam Mental Status: Awake and fully alert.  Occasional word finding difficulty with  speech hesitancy.  Oriented to place and time. Recent and remote memory intact. Attention span, concentration and fund of knowledge appropriate.  Family reports waxing/waning of cognition and memory loss.  Mood and affect appropriate.  MMSE - Mini Mental State Exam 09/06/2019  Orientation to time 3  Orientation to Place 5  Registration 3  Attention/ Calculation 0  Recall 2  Language- name 2 objects 2  Language- repeat 1  Language- follow 3 step command 3  Language- read & follow direction 1  Write a sentence 1  Copy design 1  Total score 22    Cranial Nerves: Pupils equal, briskly reactive to light. Extraocular movements full without nystagmus. Visual fields full to confrontation. Hearing diminished bilaterally facial sensation intact. Face, tongue, palate moves normally and symmetrically.  Motor: Normal bulk and tone. Normal strength in all tested extremity muscles except slight weakness of right grip and intrinsic hand muscles.  Sensory.: intact to touch ,pinprick .position and vibratory sensation.  Coordination: Rapid alternating movements normal in all extremities except slightly diminished right hand dexterity. Finger-to-nose and heel-to-shin performed accurately bilaterally. Gait and Station: Currently in wheelchair due to back pain therefore gait assessment deferred Reflexes: 1+ and symmetric. Toes downgoing.      ASSESSMENT: Mr. Glaab is a very pleasant 85 year old Caucasian lady with embolic left MCA branch infarct in August 2020 of cryptogenic etiology.  30-day cardiac event monitor unremarkable.  Vascular risk factors of diabetes, hypertension, hyperlipidemia.  Residual deficits of dysarthria/expressive aphasia, mild vascular cognitive impairment and slight decreased right hand dexterity.  She is requesting clearance to pursue potential back surgery with Dr. Trenton Gammon at [facility] neurosurgery.     PLAN: 1. Cryptogenic stroke: Continue aspirin 81 mg daily  and Lipitor 40 mg  daily for secondary stroke prevention. Maintain strict control of hypertension with blood pressure goal below 130/90, diabetes with hemoglobin A1c goal below 6.5% and cholesterol with LDL cholesterol (bad cholesterol) goal below 70 mg/dL.  I also advised the patient to eat a healthy diet with plenty of whole grains, cereals, fruits and vegetables, exercise regularly with at least 30 minutes of continuous activity daily and maintain ideal body weight. 2. HTN: Advised to continue current treatment regimen.  Today's BP stable.  Advised to continue to monitor at home along with continued follow-up with PCP for management 3. HLD: Advised to continue current treatment regimen along with continued follow-up with PCP for future prescribing and monitoring of lipid panel.  Refilled atorvastatin 40 mg daily but will be establishing care in the near future with PCP.  Advised ongoing refills will be provided by PCP 4. DMII: Advised to continue to monitor glucose levels at home along with continued follow-up with PCP for management and monitoring 5. Mild cognitive impairment, poststroke: Likely vasculature in nature.  MMSE 22/30.  At this time, no indication to initiate medication such as Aricept or Namenda which was further discussed with patient and daughter.  Will reassess memory with MMSE at follow-up visit and advised daughter to call with any concerns or worsening prior to follow-up visit  6. Clearance request: Per patient report, she wishes to pursue potential repeat back procedure by Dr. Trenton Gammon.  Discussion regarding clearance to pursue back procedure from a neurological standpoint as she is greater than 6 months post stroke and discontinuing aspirin for 3 to 5 days prior to procedure (or as recommended by neurosurgery) is at a small but acceptable preprocedure risk of reoccurring stroke while off antithrombotic.  She was advised that a clearance note will be faxed to [place] neurosurgery per patient request.  She  does plan on following up with cardiology in the  near future for cardiac clearance.  Follow-up in 6 months or call earlier if needed  greater than 50% time during this 30-minute visit was spent on counseling and coordination of care about her cryptogenic stroke, discussion regarding post stroke deficits including cognitive impairment and performing MMSE, discussion regarding pursuing possible back procedure, discussion regarding ongoing monitoring and management for secondary stroke prevention and answered all questions to patient and daughter satisfaction.    Frann Rider, AGNP-BC  [facility] [address] [place], [place] [address]  Phone [phone] Fax [phone] Note: This document was prepared with digital dictation and possible smart phrase technology. Any transcriptional errors that result from this process are unintentional.

## 2019-09-07 ENCOUNTER — Telehealth: Payer: Self-pay

## 2019-09-07 NOTE — Telephone Encounter (Signed)
Received PA and there seems to be a sticky note covering info and would like information to be re faxed without sticky note  Fax#[phone] (only one time to use this number)

## 2019-09-07 NOTE — Progress Notes (Signed)
Medical clearance letter fax with confirmation received to Dr. Annette Stable.  [phone].

## 2019-09-07 NOTE — Progress Notes (Signed)
I agree with the above plan 

## 2019-09-13 ENCOUNTER — Encounter: Payer: Self-pay | Admitting: *Deleted

## 2019-09-13 NOTE — Telephone Encounter (Signed)
Amy [name] , I dont have a referral for this patient ? I see in jessica's last note --- We will send a letter to Dr. Trenton Gammon regarding potential back surgery and clearance to pursue procedure from a neurological standpoint  . Thanks Hinton Dyer

## 2019-09-18 NOTE — Progress Notes (Signed)
Cardiology Office Note   Date:  09/20/2019   ID:  Amy [name], DOB 11/22/1935, MRN [id number]  PCP:  Amy Gravel, MD  Cardiologist:   [name], MD    F/U of CHF     History of Present Illness: Amy [name] is a 84 y.o. female with a history of DM, breast CA (s/p lumpectomy)  No hx of CAD  She was admitted to [facility] in Aug 2020with R sided weakness  Transferred to [facility].  MRI with acute/subacute infarct in L precentral gyrus.  Also a punctated acute/subacute infarct in L parietal lobe   CTA showed plauing of aortic arch and R carotid  Echo showed LVEF 40 to 45%  TEE showed LVEF 30 to 35% with small PFO   Event felt embolic    An event monitor was ordered that showed no signif arrhythmia  She was last seen in cardiology by Claudina [name] in Nov  The pt was seen in ED in Jan with some confusion  Nothing acute found    The patient notes rare CP while in bed   Fleeting  She denies SOB   No palptiations  No dizziness  No edema    Pt being seen by A Pool (neurosurg)   Plans for back surgery       Current Meds  Medication Sig  . ALPRAZolam (XANAX) 0.5 MG tablet Take 1 tablet (0.5 mg total) by mouth at bedtime.  Marland Kitchen aspirin EC 81 MG tablet Take 81 mg by mouth every morning.  Marland Kitchen atorvastatin (LIPITOR) 40 MG tablet Take 1 tablet (40 mg total) by mouth daily at 6 PM.  . calcium carbonate (OSCAL) 1500 (600 Ca) MG TABS tablet Take 600 mg of elemental calcium by mouth daily with supper.   . cholecalciferol (VITAMIN D) 1000 units tablet Take 1 tablet (1,000 Units total) by mouth daily.  . cyanocobalamin (,VITAMIN B-12,) 1000 MCG/ML injection Daily for 4 days; then weekly for 1 month and then monthly after that.  . escitalopram (LEXAPRO) 20 MG tablet Take 1 tablet (20 mg total) by mouth daily.  Marland Kitchen gabapentin (NEURONTIN) 300 MG capsule Take 1 capsule (300 mg total) by mouth at bedtime.  Marland Kitchen letrozole (FEMARA) 2.5 MG tablet TAKE 1 TABLET(2.5 MG) BY MOUTH DAILY (Patient taking differently: Take  2.5 mg by mouth daily. )  . metFORMIN (GLUCOPHAGE) 500 MG tablet Take 1 tablet (500 mg total) by mouth 2 (two) times daily with a meal.  . risperiDONE (RISPERDAL) 1 MG tablet Take 1 tablet (1 mg total) by mouth at bedtime.  . traZODone (DESYREL) 50 MG tablet Take 1 tablet (50 mg total) by mouth at bedtime.  Marland Kitchen venlafaxine XR (EFFEXOR-XR) 150 MG 24 hr capsule Take 1 capsule (150 mg total) by mouth 2 (two) times daily.     Allergies:   Cymbalta [duloxetine hcl]   Past Medical History:  Diagnosis Date  . Anxiety   . Arthritis   . Back pain   . Colon polyps   . Depression   . Diabetes mellitus, type II ([place])    diet controlled  . Hypercholesterolemia   . Invasive ductal carcinoma of breast, female, right ([place])   . Osteopenia 11/26/2016  . Skin-picking disorder    has 3 open wounds on right wrist that are being treated. About a quarter in size.  . Stroke [facility])     Past Surgical History:  Procedure Laterality Date  . APPENDECTOMY    . BACK SURGERY    .  BREAST BIOPSY Right 10/09/2016   Procedure: BREAST BIOPSY WITH NEEDLE LOCALIZATION;  Surgeon: [name], MD;  Location: [facility] ORS;  Service: General;  Laterality: Right;  . BREAST SURGERY Right   . BUBBLE STUDY  03/23/2019   Procedure: BUBBLE STUDY;  Surgeon: [name], MD;  Location: [facility] ENDOSCOPY;  Service: Cardiovascular;;  . BUNIONECTOMY Bilateral   . FACIAL COSMETIC SURGERY     drooping eyelid and brow  . HERNIA REPAIR Right    Inguinal x2  . KNEE ARTHROSCOPY Right   . SHOULDER SURGERY Right    rotator cuff repair and removal of bone spur  . TEE WITHOUT CARDIOVERSION N/A 03/23/2019   Procedure: TRANSESOPHAGEAL ECHOCARDIOGRAM (TEE);  Surgeon: [name], MD;  Location: [facility] ENDOSCOPY;  Service: Cardiovascular;  Laterality: N/A;  . TEMPOROMANDIBULAR JOINT SURGERY Right   . TOTAL KNEE ARTHROPLASTY Right 05/05/2018   Procedure: TOTAL KNEE ARTHROPLASTY;  Surgeon: [name], MD;  Location: [facility] ORS;  Service:  Orthopedics;  Laterality: Right;     Social History:  The patient  reports that she quit smoking about 24 years ago. Her smoking use included cigarettes. She has a 20.00 pack-year smoking history. She has never used smokeless tobacco. She reports that she does not drink alcohol or use drugs.   Family History:  The patient's family history includes Alcohol abuse in her brother; Anxiety disorder in her mother; Cancer - Other in her sister; Depression in her maternal grandmother and mother.    ROS:  Please see the history of present illness. All other systems are reviewed and  Negative to the above problem except as noted.    PHYSICAL EXAM: VS:  BP 130/82   Pulse 87   Ht 5\' 4"  (1.626 m)   Wt 161 lb 3.2 oz (73.1 kg)   BMI 27.67 kg/m   GEN: Frail 84 yo  in no acute distress  HEENT: normal  Neck: no JVD, carotid bruits Cardiac: RRR; no murmurs, rubs, or gallops,no LE  edema  Respiratory:  clear to auscultation bilaterally, normal work of breathing GI: soft, nontender, nondistended, + BS  No hepatomegaly  MS: no deformity Moving all extremities   Skin: warm and dry, no rash Neuro:  Strength and sensation are intact Psych: euthymic mood, full affect   EKG:  EKG is ordered today. SR 87 bpm     Lipid Panel    Component Value Date/Time   CHOL 172 05/06/2019 1451   TRIG 203 (H) 05/06/2019 1451   HDL 83 05/06/2019 1451   CHOLHDL 2.1 05/06/2019 1451   CHOLHDL 3.5 03/21/2019 0647   VLDL 35 03/21/2019 0647   LDLCALC 57 05/06/2019 1451      Wt Readings from Last 3 Encounters:  09/20/19 161 lb 3.2 oz (73.1 kg)  09/06/19 163 lb 3.2 oz (74 kg)  08/19/19 162 lb 11.2 oz (73.8 kg)      ASSESSMENT AND PLAN:  1  Chronic systolic CHF   Pt hosp in AUg 2020 with CVA   Echo showed LVEF was mod to severely depressed     Small PFO noted on bubble study   Atherosclerosis noted in aorta    She deneis signif CP    Breathing is OK  But she is not too active  On exam, volume status is OK EKG  without Q waves    I would keep on current regimen    I would recomm a repeat  echo to reeval LVEF  If LVEF remains  down would add low dose b blocker, ARB   Consider noninvasive eval  2   CVA   Event monitor negative   Pt had small PFO noted   Continues on ASA  3  Atherosclerosis   Pt has atherosclerosis of aorta    Now on lipitor 40 mg   Will get lipids    4  HL   Need repeat lipids now that on statin     Current medicines are reviewed at length with the patient today.  The patient does not have concerns regarding medicines.  Signed, [name], MD  09/20/2019 4:09 PM    [facility] [place], [place], [place]  [address] Phone: [phone]; Fax: [phone]

## 2019-09-20 ENCOUNTER — Ambulatory Visit: Payer: Medicare HMO | Admitting: Internal Medicine

## 2019-09-20 ENCOUNTER — Other Ambulatory Visit: Payer: Self-pay

## 2019-09-20 ENCOUNTER — Encounter: Payer: Self-pay | Admitting: Internal Medicine

## 2019-09-20 VITALS — BP 130/82 | HR 87 | Ht 64.0 in | Wt 161.2 lb

## 2019-09-20 DIAGNOSIS — I5022 Chronic systolic (congestive) heart failure: Secondary | ICD-10-CM

## 2019-09-20 NOTE — Patient Instructions (Signed)
Medication Instructions:  No changes *If you need a refill on your cardiac medications before your next appointment, please call your pharmacy*  Lab Work: none If you have labs (blood work) drawn today and your tests are completely normal, you will receive your results only by: Marland Kitchen MyChart Message (if you have MyChart) OR . A paper copy in the mail If you have any lab test that is abnormal or we need to change your treatment, we will call you to review the results.  Testing/Procedures: Your physician has requested that you have an echocardiogram. Echocardiography is a painless test that uses sound waves to create images of your heart. It provides your doctor with information about the size and shape of your heart and how well your heart's chambers and valves are working. This procedure takes approximately one hour. There are no restrictions for this procedure. [facility]

## 2019-09-24 ENCOUNTER — Other Ambulatory Visit: Payer: Self-pay

## 2019-09-24 ENCOUNTER — Telehealth (INDEPENDENT_AMBULATORY_CARE_PROVIDER_SITE_OTHER): Payer: Medicare HMO | Admitting: Internal Medicine

## 2019-09-24 DIAGNOSIS — R002 Palpitations: Secondary | ICD-10-CM

## 2019-09-24 DIAGNOSIS — I5022 Chronic systolic (congestive) heart failure: Secondary | ICD-10-CM

## 2019-09-24 DIAGNOSIS — Z8673 Personal history of transient ischemic attack (TIA), and cerebral infarction without residual deficits: Secondary | ICD-10-CM

## 2019-09-24 DIAGNOSIS — I639 Cerebral infarction, unspecified: Secondary | ICD-10-CM

## 2019-09-24 NOTE — Progress Notes (Addendum)
Electrophysiology TeleHealth Note  Due to national recommendations of social distancing due to [place] 19, an audio telehealth visit is felt to be most appropriate for this patient at this time.  Verbal consent was obtained by me for the telehealth visit today.  The patient does not have capability for a virtual visit.  A phone visit is therefore required today.   Date:  09/24/2019   ID:  Amy [name], DOB [birth year], MRN [id number]  Location: patient's home  Provider location:  [place]  Evaluation Performed: Follow-up visit  PCP:  [name], MD   Electrophysiologist:  Dr [name]  Chief Complaint:  Follow up  History of Present Illness:    Amy [name] is a 84 y.o. female who presents via telehealth conferencing today.  Since last being seen in our clinic, the patient reports doing reasonably well.  She has chronic fatigue and shortness of breath on exertion. She wore cardiac monitor which demonstrated no atrial fibrillation post CVA.  She has been working with Dr [name] on heart failure medical therapy.  Today, she denies symptoms of palpitations, chest pain,  lower extremity edema, dizziness, presyncope, or syncope.  The patient is otherwise without complaint today.  The patient denies symptoms of fevers, chills, cough, or new SOB worrisome for COVID 19.  Past Medical History:  Diagnosis Date  . Anxiety   . Arthritis   . Back pain   . Colon polyps   . Depression   . Diabetes mellitus, type II ([place])    diet controlled  . Hypercholesterolemia   . Invasive ductal carcinoma of breast, female, right ([place])   . Osteopenia 11/26/2016  . Skin-picking disorder    has 3 open wounds on right wrist that are being treated. About a quarter in size.  . Stroke [facility])     Past Surgical History:  Procedure Laterality Date  . APPENDECTOMY    . BACK SURGERY    . BREAST BIOPSY Right 10/09/2016   Procedure: BREAST BIOPSY WITH NEEDLE LOCALIZATION;  Surgeon: [name], MD;   Location: [facility] ORS;  Service: General;  Laterality: Right;  . BREAST SURGERY Right   . BUBBLE STUDY  03/23/2019   Procedure: BUBBLE STUDY;  Surgeon: [name], MD;  Location: [facility] ENDOSCOPY;  Service: Cardiovascular;;  . BUNIONECTOMY Bilateral   . FACIAL COSMETIC SURGERY     drooping eyelid and brow  . HERNIA REPAIR Right    Inguinal x2  . KNEE ARTHROSCOPY Right   . SHOULDER SURGERY Right    rotator cuff repair and removal of bone spur  . TEE WITHOUT CARDIOVERSION N/A 03/23/2019   Procedure: TRANSESOPHAGEAL ECHOCARDIOGRAM (TEE);  Surgeon: [name], MD;  Location: [facility] ENDOSCOPY;  Service: Cardiovascular;  Laterality: N/A;  . TEMPOROMANDIBULAR JOINT SURGERY Right   . TOTAL KNEE ARTHROPLASTY Right 05/05/2018   Procedure: TOTAL KNEE ARTHROPLASTY;  Surgeon: [name], MD;  Location: [facility] ORS;  Service: Orthopedics;  Laterality: Right;    Current Outpatient Medications  Medication Sig Dispense Refill  . ALPRAZolam (XANAX) 0.5 MG tablet Take 1 tablet (0.5 mg total) by mouth at bedtime. 30 tablet 2  . aspirin EC 81 MG tablet Take 81 mg by mouth every morning.    Marland Kitchen atorvastatin (LIPITOR) 40 MG tablet Take 1 tablet (40 mg total) by mouth daily at 6 PM. 90 tablet 2  . calcium carbonate (OSCAL) 1500 (600 Ca) MG TABS tablet Take 600 mg of elemental calcium by mouth daily with  supper.     . carvedilol (COREG) 3.125 MG tablet Take 1 tablet (3.125 mg total) by mouth 2 (two) times daily. 180 tablet 3  . cholecalciferol (VITAMIN D) 1000 units tablet Take 1 tablet (1,000 Units total) by mouth daily. 30 tablet 11  . cyanocobalamin (,VITAMIN B-12,) 1000 MCG/ML injection Daily for 4 days; then weekly for 1 month and then monthly after that. 12 mL 0  . escitalopram (LEXAPRO) 20 MG tablet Take 1 tablet (20 mg total) by mouth daily. 30 tablet 2  . gabapentin (NEURONTIN) 300 MG capsule Take 1 capsule (300 mg total) by mouth at bedtime. 30 capsule 2  . letrozole (FEMARA) 2.5 MG tablet TAKE 1  TABLET(2.5 MG) BY MOUTH DAILY (Patient taking differently: Take 2.5 mg by mouth daily. ) 90 tablet 3  . losartan (COZAAR) 25 MG tablet Take 0.5 tablets (12.5 mg total) by mouth at bedtime. 45 tablet 3  . metFORMIN (GLUCOPHAGE) 500 MG tablet Take 1 tablet (500 mg total) by mouth 2 (two) times daily with a meal. 60 tablet 0  . risperiDONE (RISPERDAL) 1 MG tablet Take 1 tablet (1 mg total) by mouth at bedtime. 30 tablet 2  . traZODone (DESYREL) 50 MG tablet Take 1 tablet (50 mg total) by mouth at bedtime. 30 tablet 2  . venlafaxine XR (EFFEXOR-XR) 150 MG 24 hr capsule Take 1 capsule (150 mg total) by mouth 2 (two) times daily. 60 capsule 2   No current facility-administered medications for this visit.    Allergies:   Cymbalta [duloxetine hcl]   Social History:  The patient  reports that she quit smoking about 24 years ago. Her smoking use included cigarettes. She has a 20.00 pack-year smoking history. She has never used smokeless tobacco. She reports that she does not drink alcohol or use drugs.   Family History:  The patient's family history includes Alcohol abuse in her brother; Anxiety disorder in her mother; Cancer - Other in her sister; Depression in her maternal grandmother and mother.   ROS:  Please see the history of present illness.   All other systems are personally reviewed and negative.    Exam:    Vital Signs:  There were no vitals taken for this visit.  Well sounding, alert and conversant   Labs/Other Tests and Data Reviewed:    Recent Labs: 05/20/2019: B Natriuretic Peptide 47.0 08/16/2019: ALT 11 08/17/2019: TSH 1.969 08/18/2019: Hemoglobin 14.2; Magnesium 1.5; Platelets 244 08/19/2019: BUN 10; Creatinine, Ser 0.65; Potassium 3.7; Sodium 141   Wt Readings from Last 3 Encounters:  09/20/19 161 lb 3.2 oz (73.1 kg)  09/06/19 163 lb 3.2 oz (74 kg)  08/19/19 162 lb 11.2 oz (73.8 kg)     ASSESSMENT & PLAN:    1.  Cryptogenic stroke/ palpitations She wore an event monitor  with no documented atrial fibrillation See below If she does not qualify for Alleviate HF, would schedule ILR implant in office. Risks, benefits reviewed with patient who wishes to proceed.   2.  Chronic systolic heart failure She has ongoing shortness of breath and class III HF symptoms She was seen by the AHF team in hospital during stroke admission Will ask research to screen for Alleviate HF study Continue medical therapy    Follow-up:  After ILR implant    Patient Risk:  after full review of this patients clinical status, I feel that they are at moderate risk at this time.  Today, I have spent 15 minutes with the patient with  telehealth technology discussing arrhythmia management .    Army Fossa, MD  09/24/2019 10:19 AM     [facility] [address] [place] [address] [phone] (office) [phone] (fax)

## 2019-09-27 ENCOUNTER — Other Ambulatory Visit: Payer: Self-pay | Admitting: Internal Medicine

## 2019-09-27 ENCOUNTER — Ambulatory Visit (HOSPITAL_COMMUNITY)
Admission: RE | Admit: 2019-09-27 | Discharge: 2019-09-27 | Disposition: A | Discharge status: Home / Self Care | Payer: Medicare HMO | Source: Ambulatory Visit | Attending: Internal Medicine | Admitting: Internal Medicine

## 2019-09-27 DIAGNOSIS — I5022 Chronic systolic (congestive) heart failure: Secondary | ICD-10-CM

## 2019-09-27 NOTE — Progress Notes (Signed)
*  PRELIMINARY RESULTS* Echocardiogram 2D Echocardiogram has been performed.  Amy [name] 09/27/2019, 4:22 PM

## 2019-09-29 ENCOUNTER — Telehealth: Payer: Self-pay | Admitting: *Deleted

## 2019-09-29 DIAGNOSIS — I5022 Chronic systolic (congestive) heart failure: Secondary | ICD-10-CM

## 2019-09-29 MED ORDER — METOPROLOL SUCCINATE ER 25 MG PO TB24: 25.0000 mg | ORAL_TABLET | Freq: Every day | ORAL | 11 refills | Status: DC

## 2019-09-29 NOTE — Telephone Encounter (Signed)
-----   Message from Fay Records, MD sent at 09/28/2019 11:30 PM EST ----- Pumping function of the L ventricle remains moderately down.  Not changed significantly from previous echo REcomm: 1.  Would recomm adding  low dose Toprol XL to regimen 25 mg   Will follow BP 2  Should probably sched lexiscan myovue to make sure there is not a blood supply problem to heart causing pumping to be down.    3  F/U with me later in spring

## 2019-09-29 NOTE — Telephone Encounter (Signed)
I spoke with patient's son who reports patient does have problems with memory and depression at times.  Has been seen by neurology. I reviewed echo results and recommendations with patient's son.  Will send prescription for Toprol to [facility] on [address]. They do not have BP cuff but will purchase one.  I verbally went over [place] instructions with patient's son.  He would like this to be done at [facility] and to call him with appointment information. Will forward to [facility] regarding follow up appointment. Son is requesting appointment on Tuesday or Thursday after 10 AM if possible.  Son is asking about possibility of patient having back surgery.  He reports patient would like to have this.  Clearance was previously sent to office (see telephone clearance note from 06/22/19)

## 2019-09-29 NOTE — Telephone Encounter (Signed)
I placed call to patient and reviewed information from Dr [name] with her.  She is having trouble remembering things and asked me to contact her son to discuss.  I placed call to patient's son and left message to call office.

## 2019-10-05 ENCOUNTER — Other Ambulatory Visit: Payer: Self-pay

## 2019-10-05 ENCOUNTER — Emergency Department (HOSPITAL_COMMUNITY): Payer: Medicare HMO

## 2019-10-05 ENCOUNTER — Encounter (HOSPITAL_COMMUNITY): Payer: Self-pay

## 2019-10-05 ENCOUNTER — Observation Stay (HOSPITAL_COMMUNITY)
Admission: EM | Admit: 2019-10-05 | Discharge: 2019-10-07 | Disposition: A | Discharge status: Home / Self Care | Payer: Medicare HMO | Attending: Internal Medicine | Admitting: Internal Medicine

## 2019-10-05 DIAGNOSIS — R2689 Other abnormalities of gait and mobility: Secondary | ICD-10-CM | POA: Diagnosis not present

## 2019-10-05 DIAGNOSIS — I5022 Chronic systolic (congestive) heart failure: Secondary | ICD-10-CM | POA: Diagnosis present

## 2019-10-05 DIAGNOSIS — E1122 Type 2 diabetes mellitus with diabetic chronic kidney disease: Secondary | ICD-10-CM | POA: Diagnosis not present

## 2019-10-05 DIAGNOSIS — I5042 Chronic combined systolic (congestive) and diastolic (congestive) heart failure: Secondary | ICD-10-CM | POA: Insufficient documentation

## 2019-10-05 DIAGNOSIS — Z87891 Personal history of nicotine dependence: Secondary | ICD-10-CM | POA: Insufficient documentation

## 2019-10-05 DIAGNOSIS — R4189 Other symptoms and signs involving cognitive functions and awareness: Secondary | ICD-10-CM

## 2019-10-05 DIAGNOSIS — I130 Hypertensive heart and chronic kidney disease with heart failure and stage 1 through stage 4 chronic kidney disease, or unspecified chronic kidney disease: Secondary | ICD-10-CM | POA: Diagnosis not present

## 2019-10-05 DIAGNOSIS — Z79899 Other long term (current) drug therapy: Secondary | ICD-10-CM | POA: Diagnosis not present

## 2019-10-05 DIAGNOSIS — F329 Major depressive disorder, single episode, unspecified: Secondary | ICD-10-CM | POA: Diagnosis not present

## 2019-10-05 DIAGNOSIS — F419 Anxiety disorder, unspecified: Secondary | ICD-10-CM | POA: Insufficient documentation

## 2019-10-05 DIAGNOSIS — Z20822 Contact with and (suspected) exposure to covid-19: Secondary | ICD-10-CM | POA: Diagnosis not present

## 2019-10-05 DIAGNOSIS — N181 Chronic kidney disease, stage 1: Secondary | ICD-10-CM | POA: Insufficient documentation

## 2019-10-05 DIAGNOSIS — Z8673 Personal history of transient ischemic attack (TIA), and cerebral infarction without residual deficits: Secondary | ICD-10-CM | POA: Diagnosis not present

## 2019-10-05 DIAGNOSIS — G9341 Metabolic encephalopathy: Principal | ICD-10-CM | POA: Insufficient documentation

## 2019-10-05 DIAGNOSIS — E782 Mixed hyperlipidemia: Secondary | ICD-10-CM | POA: Insufficient documentation

## 2019-10-05 DIAGNOSIS — N390 Urinary tract infection, site not specified: Secondary | ICD-10-CM | POA: Diagnosis not present

## 2019-10-05 DIAGNOSIS — I9589 Other hypotension: Secondary | ICD-10-CM | POA: Diagnosis not present

## 2019-10-05 DIAGNOSIS — R5381 Other malaise: Secondary | ICD-10-CM | POA: Diagnosis present

## 2019-10-05 DIAGNOSIS — C50911 Malignant neoplasm of unspecified site of right female breast: Secondary | ICD-10-CM | POA: Diagnosis present

## 2019-10-05 DIAGNOSIS — I959 Hypotension, unspecified: Secondary | ICD-10-CM | POA: Diagnosis present

## 2019-10-05 LAB — URINALYSIS, ROUTINE W REFLEX MICROSCOPIC
Bilirubin Urine: NEGATIVE
Glucose, UA: NEGATIVE mg/dL
Hgb urine dipstick: NEGATIVE
Ketones, ur: NEGATIVE mg/dL
Nitrite: POSITIVE — AB
Protein, ur: NEGATIVE mg/dL
Specific Gravity, Urine: 1.015 (ref 1.005–1.030)
pH: 5 (ref 5.0–8.0)

## 2019-10-05 LAB — CBC WITH DIFFERENTIAL/PLATELET
Abs Immature Granulocytes: 0.04 10*3/uL (ref 0.00–0.07)
Basophils Absolute: 0 10*3/uL (ref 0.0–0.1)
Basophils Relative: 0 %
Eosinophils Absolute: 0.3 10*3/uL (ref 0.0–0.5)
Eosinophils Relative: 3 %
HCT: 39.9 % (ref 36.0–46.0)
Hemoglobin: 12.7 g/dL (ref 12.0–15.0)
Immature Granulocytes: 1 %
Lymphocytes Relative: 15 %
Lymphs Abs: 1.2 10*3/uL (ref 0.7–4.0)
MCH: 30.6 pg (ref 26.0–34.0)
MCHC: 31.8 g/dL (ref 30.0–36.0)
MCV: 96.1 fL (ref 80.0–100.0)
Monocytes Absolute: 0.7 10*3/uL (ref 0.1–1.0)
Monocytes Relative: 9 %
Neutro Abs: 5.7 10*3/uL (ref 1.7–7.7)
Neutrophils Relative %: 72 %
Platelets: 214 10*3/uL (ref 150–400)
RBC: 4.15 MIL/uL (ref 3.87–5.11)
RDW: 13 % (ref 11.5–15.5)
WBC: 7.9 10*3/uL (ref 4.0–10.5)
nRBC: 0 % (ref 0.0–0.2)

## 2019-10-05 LAB — COMPREHENSIVE METABOLIC PANEL
ALT: 13 U/L (ref 0–44)
AST: 16 U/L (ref 15–41)
Albumin: 3.7 g/dL (ref 3.5–5.0)
Alkaline Phosphatase: 54 U/L (ref 38–126)
Anion gap: 9 (ref 5–15)
BUN: 27 mg/dL — ABNORMAL HIGH (ref 8–23)
CO2: 28 mmol/L (ref 22–32)
Calcium: 8.8 mg/dL — ABNORMAL LOW (ref 8.9–10.3)
Chloride: 101 mmol/L (ref 98–111)
Creatinine, Ser: 1.06 mg/dL — ABNORMAL HIGH (ref 0.44–1.00)
GFR calc Af Amer: 57 mL/min — ABNORMAL LOW (ref >60)
GFR calc non Af Amer: 49 mL/min — ABNORMAL LOW (ref >60)
Glucose, Bld: 94 mg/dL (ref 70–99)
Potassium: 4 mmol/L (ref 3.5–5.1)
Sodium: 138 mmol/L (ref 135–145)
Total Bilirubin: 0.7 mg/dL (ref 0.3–1.2)
Total Protein: 6.1 g/dL — ABNORMAL LOW (ref 6.5–8.1)

## 2019-10-05 LAB — LACTIC ACID, PLASMA
Lactic Acid, Venous: 2 mmol/L (ref 0.5–1.9)
Lactic Acid, Venous: 1.1 mmol/L (ref 0.5–1.9)

## 2019-10-05 LAB — TROPONIN I (HIGH SENSITIVITY)
Troponin I (High Sensitivity): 4 ng/L (ref <18)
Troponin I (High Sensitivity): 5 ng/L (ref <18)

## 2019-10-05 LAB — MAGNESIUM: Magnesium: 1.5 mg/dL — ABNORMAL LOW (ref 1.7–2.4)

## 2019-10-05 LAB — TSH: TSH: 1.125 u[IU]/mL (ref 0.350–4.500)

## 2019-10-05 MED ORDER — MAGNESIUM SULFATE 2 GM/50ML IV SOLN
2.0000 g | Freq: Once | INTRAVENOUS | Status: AC
  Administered 2019-10-05: 2 g via INTRAVENOUS
  Filled 2019-10-05: qty 50

## 2019-10-05 MED ORDER — SODIUM CHLORIDE 0.9% FLUSH
3.0000 mL | Freq: Two times a day (BID) | INTRAVENOUS | Status: DC
  Administered 2019-10-05 – 2019-10-07 (×4): 3 mL via INTRAVENOUS

## 2019-10-05 MED ORDER — SODIUM CHLORIDE 0.9 % IV SOLN: 1.0000 g | INTRAVENOUS | Status: DC

## 2019-10-05 MED ORDER — ATORVASTATIN CALCIUM 40 MG PO TABS
40.0000 mg | ORAL_TABLET | Freq: Every day | ORAL | Status: DC
  Administered 2019-10-06: 40 mg via ORAL
  Filled 2019-10-05 (×2): qty 1

## 2019-10-05 MED ORDER — LETROZOLE 2.5 MG PO TABS
2.5000 mg | ORAL_TABLET | Freq: Every day | ORAL | Status: DC
  Administered 2019-10-06 – 2019-10-07 (×2): 2.5 mg via ORAL
  Filled 2019-10-05 (×3): qty 1

## 2019-10-05 MED ORDER — ACETAMINOPHEN 650 MG RE SUPP: 650.0000 mg | Freq: Four times a day (QID) | RECTAL | Status: DC | PRN

## 2019-10-05 MED ORDER — VENLAFAXINE HCL ER 75 MG PO CP24
150.0000 mg | ORAL_CAPSULE | Freq: Two times a day (BID) | ORAL | Status: DC
  Administered 2019-10-05 – 2019-10-07 (×4): 150 mg via ORAL
  Filled 2019-10-05 (×4): qty 2

## 2019-10-05 MED ORDER — RISPERIDONE 1 MG PO TABS
1.0000 mg | ORAL_TABLET | Freq: Every day | ORAL | Status: DC
  Administered 2019-10-05 – 2019-10-06 (×2): 1 mg via ORAL
  Filled 2019-10-05 (×2): qty 1

## 2019-10-05 MED ORDER — INSULIN ASPART 100 UNIT/ML ~~LOC~~ SOLN
0.0000 [IU] | Freq: Three times a day (TID) | SUBCUTANEOUS | Status: DC
  Administered 2019-10-06: 1 [IU] via SUBCUTANEOUS

## 2019-10-05 MED ORDER — ASPIRIN EC 81 MG PO TBEC
81.0000 mg | DELAYED_RELEASE_TABLET | Freq: Every morning | ORAL | Status: DC
  Administered 2019-10-06 – 2019-10-07 (×2): 81 mg via ORAL
  Filled 2019-10-05 (×2): qty 1

## 2019-10-05 MED ORDER — SODIUM CHLORIDE 0.9 % IV SOLN: 250.0000 mL | INTRAVENOUS | Status: DC | PRN

## 2019-10-05 MED ORDER — ALPRAZOLAM 0.5 MG PO TABS
0.5000 mg | ORAL_TABLET | Freq: Every day | ORAL | Status: DC
  Administered 2019-10-05 – 2019-10-06 (×2): 0.5 mg via ORAL
  Filled 2019-10-05 (×2): qty 1

## 2019-10-05 MED ORDER — SODIUM CHLORIDE 0.9 % IV SOLN
1.0000 g | Freq: Once | INTRAVENOUS | Status: AC
  Administered 2019-10-05: 1 g via INTRAVENOUS
  Filled 2019-10-05: qty 10

## 2019-10-05 MED ORDER — GABAPENTIN 300 MG PO CAPS
300.0000 mg | ORAL_CAPSULE | Freq: Every day | ORAL | Status: DC
  Administered 2019-10-05 – 2019-10-06 (×2): 300 mg via ORAL
  Filled 2019-10-05 (×2): qty 1

## 2019-10-05 MED ORDER — ENOXAPARIN SODIUM 40 MG/0.4ML ~~LOC~~ SOLN
40.0000 mg | SUBCUTANEOUS | Status: DC
  Administered 2019-10-05 – 2019-10-06 (×2): 40 mg via SUBCUTANEOUS
  Filled 2019-10-05 (×2): qty 0.4

## 2019-10-05 MED ORDER — METOPROLOL SUCCINATE ER 25 MG PO TB24
25.0000 mg | ORAL_TABLET | Freq: Every day | ORAL | Status: DC
  Administered 2019-10-06 – 2019-10-07 (×2): 25 mg via ORAL
  Filled 2019-10-05 (×2): qty 1

## 2019-10-05 MED ORDER — ACETAMINOPHEN 325 MG PO TABS: 650.0000 mg | ORAL_TABLET | Freq: Four times a day (QID) | ORAL | Status: DC | PRN

## 2019-10-05 MED ORDER — ONDANSETRON HCL 4 MG PO TABS: 4.0000 mg | ORAL_TABLET | Freq: Four times a day (QID) | ORAL | Status: DC | PRN

## 2019-10-05 MED ORDER — TRAZODONE HCL 50 MG PO TABS
50.0000 mg | ORAL_TABLET | Freq: Every day | ORAL | Status: DC
  Administered 2019-10-05 – 2019-10-06 (×2): 50 mg via ORAL
  Filled 2019-10-05 (×2): qty 1

## 2019-10-05 MED ORDER — SODIUM CHLORIDE 0.9% FLUSH: 3.0000 mL | INTRAVENOUS | Status: DC | PRN

## 2019-10-05 MED ORDER — ONDANSETRON HCL 4 MG/2ML IJ SOLN: 4.0000 mg | Freq: Four times a day (QID) | INTRAMUSCULAR | Status: DC | PRN

## 2019-10-05 MED ORDER — SODIUM CHLORIDE 0.9 % IV BOLUS
500.0000 mL | Freq: Once | INTRAVENOUS | Status: AC
  Administered 2019-10-05: 500 mL via INTRAVENOUS

## 2019-10-05 NOTE — ED Provider Notes (Signed)
Emergency Department Provider Note   I have reviewed the triage vital signs and the nursing notes.   HISTORY  Chief Complaint Hypotension   HPI Amy [name] is a 84 y.o. female with PMH reviewed below presents to the emergency department for evaluation of low blood pressures at home noticed by [facility] home health today.  Patient reported as more weak and "lethargic" per EMS by home health staff.  They were obtaining blood pressure readings at home of 80/58.  No reported fever.  EMS noted some bruising to the right forehead with possible fall in the last several days.  Nursing here in the ED discussed the case with her son who helps to keep track of her medicines.  The patient doses her own medicines but he states that she was taken off of Coreg and started on metoprolol in the past 2 weeks.  He believes that she is continuing to take the Coreg along with the new prescription for metoprolol.  Patient denies any pain but does have some confusion limiting history.  Level 5 caveat applies.    Past Medical History:  Diagnosis Date  . Anxiety   . Arthritis   . Back pain   . Colon polyps   . Depression   . Diabetes mellitus, type II ([place])    diet controlled  . Hypercholesterolemia   . Invasive ductal carcinoma of breast, female, right ([place])   . Osteopenia 11/26/2016  . Skin-picking disorder    has 3 open wounds on right wrist that are being treated. About a quarter in size.  . Stroke [facility])     Patient Active Problem List   Diagnosis Date Noted  . Cognitive impairment   . UTI (urinary tract infection) 10/05/2019  . Dehydration   . Major depressive disorder, recurrent episode, mild with anxious distress ([place])   . Acute lower UTI   . Deficiency of vitamin B12   . Physical deconditioning   . Acute metabolic encephalopathy 0000000  . Acute encephalopathy 08/16/2019  . Mild renal insufficiency 08/16/2019  . Chronic systolic CHF (congestive heart failure) ([place]) 08/16/2019   . Cardiac LV ejection fraction 30-35% 03/29/2019  . Dyslipidemia associated with type 2 diabetes mellitus ([place]) 03/29/2019  . Major depression with psychotic features ([place]) 03/29/2019  . History of CVA (cerebrovascular accident) 03/20/2019  . Edema of both lower legs due to peripheral venous insufficiency 10/12/2018  . Anemia 05/12/2018  . S/P total knee replacement, right 05/05/18 05/12/2018  . Type 2 diabetes mellitus with neurological complications ([place]) [id number]  . Primary osteoarthritis of right knee   . Status post surgery 01/26/2018  . Surgery, elective 01/26/2018  . Degenerative spondylolisthesis 01/26/2018  . Osteoporosis 12/13/2016  . Osteopenia 11/26/2016  . Invasive ductal carcinoma of breast, female, right ([place])   . Lumbago 03/22/2014  . Stiffness of joint, not elsewhere classified, pelvic region and thigh 03/22/2014  . Muscle weakness (generalized) 03/22/2014  . Abnormality of gait 03/22/2014  . Depression with anxiety 09/03/2012  . Insomnia secondary to depression with anxiety 09/03/2012  . Pain 09/03/2012    Past Surgical History:  Procedure Laterality Date  . APPENDECTOMY    . BACK SURGERY    . BREAST BIOPSY Right 10/09/2016   Procedure: BREAST BIOPSY WITH NEEDLE LOCALIZATION;  Surgeon: [name], MD;  Location: [facility] ORS;  Service: General;  Laterality: Right;  . BREAST SURGERY Right   . BUBBLE STUDY  03/23/2019   Procedure: BUBBLE STUDY;  Surgeon: [name], MD;  Location: [facility] ENDOSCOPY;  Service: Cardiovascular;;  . BUNIONECTOMY Bilateral   . FACIAL COSMETIC SURGERY     drooping eyelid and brow  . HERNIA REPAIR Right    Inguinal x2  . KNEE ARTHROSCOPY Right   . SHOULDER SURGERY Right    rotator cuff repair and removal of bone spur  . TEE WITHOUT CARDIOVERSION N/A 03/23/2019   Procedure: TRANSESOPHAGEAL ECHOCARDIOGRAM (TEE);  Surgeon: [name], MD;  Location: [facility] ENDOSCOPY;  Service: Cardiovascular;  Laterality: N/A;  .  TEMPOROMANDIBULAR JOINT SURGERY Right   . TOTAL KNEE ARTHROPLASTY Right 05/05/2018   Procedure: TOTAL KNEE ARTHROPLASTY;  Surgeon: [name], MD;  Location: [facility] ORS;  Service: Orthopedics;  Laterality: Right;    Allergies Cymbalta [duloxetine hcl]  Family History  Problem Relation Age of Onset  . Anxiety disorder Mother   . Depression Mother   . Depression Maternal Grandmother   . Alcohol abuse Brother   . Cancer - Other Sister   . ADD / ADHD Neg Hx   . Bipolar disorder Neg Hx   . Dementia Neg Hx   . Drug abuse Neg Hx   . OCD Neg Hx   . Paranoid behavior Neg Hx   . Schizophrenia Neg Hx   . Seizures Neg Hx   . Sexual abuse Neg Hx   . Physical abuse Neg Hx     Social History Social History   Tobacco Use  . Smoking status: Former Smoker    Packs/day: 0.50    Years: 40.00    Pack years: 20.00    Types: Cigarettes    Quit date: 09/04/1995    Years since quitting: 24.1  . Smokeless tobacco: Never Used  Substance Use Topics  . Alcohol use: No  . Drug use: No    Types: Benzodiazepines, Hydrocodone    Review of Systems  Level 5 caveat: Encephalopathy.   ____________________________________________   PHYSICAL EXAM:  VITAL SIGNS: ED Triage Vitals  Enc Vitals Group     BP 10/05/19 1435 (!) 99/51     Pulse Rate 10/05/19 1435 67     Resp 10/05/19 1435 20     Temp 10/05/19 1435 97.7 F (36.5 C)     Temp Source 10/05/19 1435 Oral     SpO2 10/05/19 1435 100 %     Weight 10/05/19 1432 160 lb 15 oz (73 kg)     Height 10/05/19 1432 5\' 4"  (1.626 m)   Constitutional: Alert with mild somnolence and confusion. Well appearing and in no acute distress. Eyes: Conjunctivae are normal. PERRL.  Head: Atraumatic. Nose: No congestion/rhinnorhea. Mouth/Throat: Mucous membranes are moist.  Neck: No stridor.   Cardiovascular: Normal rate, regular rhythm. Good peripheral circulation. Grossly normal heart sounds.   Respiratory: Normal respiratory effort.  No retractions.  Lungs CTAB. Gastrointestinal: Soft and nontender. No distention.  Musculoskeletal: No lower extremity tenderness nor edema. No gross deformities of extremities. Neurologic:  Normal speech and language. No gross focal neurologic deficits are appreciated.  Skin:  Skin is warm, dry and intact. No rash noted.  ____________________________________________   LABS (all labs ordered are listed, but only abnormal results are displayed)  Labs Reviewed  URINE CULTURE - Abnormal; Notable for the following components:      Result Value   Culture   (*)    Value: >=100,000 COLONIES/mL ESCHERICHIA COLI SUSCEPTIBILITIES TO FOLLOW Performed at [facility], [place] [address] [place], [place] [address]    All other components within normal limits  COMPREHENSIVE METABOLIC PANEL - Abnormal; Notable for the following components:   BUN 27 (*)    Creatinine, Ser 1.06 (*)    Calcium 8.8 (*)    Total Protein 6.1 (*)    GFR calc non Af Amer 49 (*)    GFR calc Af Amer 57 (*)    All other components within normal limits  LACTIC ACID, PLASMA - Abnormal; Notable for the following components:   Lactic Acid, Venous 2.0 (*)    All other components within normal limits  URINALYSIS, ROUTINE W REFLEX MICROSCOPIC - Abnormal; Notable for the following components:   APPearance HAZY (*)    Nitrite POSITIVE (*)    Leukocytes,Ua TRACE (*)    Bacteria, UA RARE (*)    All other components within normal limits  MAGNESIUM - Abnormal; Notable for the following components:   Magnesium 1.5 (*)    All other components within normal limits  HEMOGLOBIN A1C - Abnormal; Notable for the following components:   Hgb A1c MFr Bld 5.8 (*)    All other components within normal limits  MAGNESIUM - Abnormal; Notable for the following components:   Magnesium 1.5 (*)    All other components within normal limits  COMPREHENSIVE METABOLIC PANEL - Abnormal; Notable for the following components:   Glucose, Bld 126 (*)    Total Protein  5.8 (*)    All other components within normal limits  GLUCOSE, CAPILLARY - Abnormal; Notable for the following components:   Glucose-Capillary 115 (*)    All other components within normal limits  GLUCOSE, CAPILLARY - Abnormal; Notable for the following components:   Glucose-Capillary 109 (*)    All other components within normal limits  GLUCOSE, CAPILLARY - Abnormal; Notable for the following components:   Glucose-Capillary 146 (*)    All other components within normal limits  BASIC METABOLIC PANEL - Abnormal; Notable for the following components:   Glucose, Bld 112 (*)    Calcium 8.8 (*)    All other components within normal limits  GLUCOSE, CAPILLARY - Abnormal; Notable for the following components:   Glucose-Capillary 144 (*)    All other components within normal limits  GLUCOSE, CAPILLARY - Abnormal; Notable for the following components:   Glucose-Capillary 259 (*)    All other components within normal limits  SARS CORONAVIRUS 2 (TAT 6-24 HRS)  LACTIC ACID, PLASMA  CBC WITH DIFFERENTIAL/PLATELET  TSH  CBC  GLUCOSE, CAPILLARY  MAGNESIUM  TSH  T4, FREE  VITAMIN B12  FOLATE  GLUCOSE, CAPILLARY  TROPONIN I (HIGH SENSITIVITY)  TROPONIN I (HIGH SENSITIVITY)   ____________________________________________  EKG   EKG Interpretation  Date/Time:  Tuesday October 05 2019 14:38:37 EST Ventricular Rate:  69 PR Interval:    QRS Duration: 94 QT Interval:  428 QTC Calculation: 459 R Axis:   19 Text Interpretation: Sinus rhythm No STEMI Confirmed by Nanda Quinton [phone]) on 10/05/2019 3:37:22 PM       ____________________________________________  RADIOLOGY  CT Head Wo Contrast  Result Date: 10/05/2019 CLINICAL DATA:  Lethargy, headache, weakness EXAM: CT HEAD WITHOUT CONTRAST TECHNIQUE: Contiguous axial images were obtained from the base of the skull through the vertex without intravenous contrast. COMPARISON:  08/16/2019 FINDINGS: Brain: No evidence of acute infarction,  hemorrhage, hydrocephalus, extra-axial collection or mass lesion/mass effect. Extensive low-density changes within the periventricular and subcortical white matter compatible with chronic microvascular ischemic change. Mild diffuse cerebral volume loss. Vascular: Atherosclerotic calcifications involving the large vessels of the skull base. No unexpected hyperdense  vessel. Skull: Normal. Negative for fracture or focal lesion. Sinuses/Orbits: No acute finding. Other: None. IMPRESSION: 1.  No acute intracranial findings. 2.  Chronic microvascular ischemic change and cerebral volume loss. Electronically Signed   By: Davina Poke D.O.   On: 10/05/2019 16:28   CT Cervical Spine Wo Contrast  Result Date: 10/05/2019 CLINICAL DATA:  Fall 4 days ago striking head. Polytrauma, critical, head/C-spine injury suspected EXAM: CT CERVICAL SPINE WITHOUT CONTRAST TECHNIQUE: Multidetector CT imaging of the cervical spine was performed without intravenous contrast. Multiplanar CT image reconstructions were also generated. COMPARISON:  No prior CT. FINDINGS: Alignment: Patient is tilted in the scanner with broad-based leftward curvature. Grade 1 anterolisthesis of C3 on C4, minimal retrolisthesis of C5 on C6, and anterolisthesis of C7 on T1 all appear degenerative and facet mediated. No evidence of traumatic subluxation. Skull base and vertebrae: No acute fracture. Vertebral body heights are maintained. The dens and skull base are intact. Degenerative fragmentation at C1-C2. Soft tissues and spinal canal: No prevertebral fluid or swelling. No visible canal hematoma. Disc levels: Disc space narrowing and endplate spurring from [id number] through C6-C7, most prominent at C5-C6. Prominent multilevel facet hypertrophy. Upper chest: No acute findings allowing for motion. 3 mm right upper lobe pulmonary nodule, series 3, image 83, stable dating back to 11/13/2016 chest CT and considered benign. Other: None. IMPRESSION: Degenerative change  in the cervical spine without acute fracture or subluxation. Broad-based leftward curvature may be scoliosis or positional. Electronically Signed   By: Keith Rake M.D.   On: 10/05/2019 16:32   DG Chest Portable 1 View  Result Date: 10/05/2019 CLINICAL DATA:  Altered mental status EXAM: PORTABLE CHEST 1 VIEW COMPARISON:  05/20/2019 FINDINGS: The heart size and mediastinal contours are within normal limits. Calcific aortic knob with tortuosity of the descending thoracic aorta. No focal airspace consolidation, pleural effusion, or pneumothorax. Partially visualized thoracolumbar spinal fusion hardware. IMPRESSION: No acute cardiopulmonary findings. Electronically Signed   By: Davina Poke D.O.   On: 10/05/2019 16:13   DG Hand Complete Left  Result Date: 10/05/2019 CLINICAL DATA:  Bruising, fall EXAM: LEFT HAND - COMPLETE 3+ VIEW COMPARISON:  None. FINDINGS: No fracture or malalignment. Advanced degenerative change at the first [facility] joint. Mild cartilaginous calcification. IMPRESSION: No acute osseous abnormality. Electronically Signed   By: Donavan Foil M.D.   On: 10/05/2019 16:13    ____________________________________________   PROCEDURES  Procedure(s) performed:   Procedures  None ____________________________________________   INITIAL IMPRESSION / ASSESSMENT AND PLAN / ED COURSE  Pertinent labs & imaging results that were available during my care of the patient were reviewed by me and considered in my medical decision making (see chart for details).   Patient presents emergency department with altered mental status and low blood pressure at home.  Suspect medication mismanagement.  Patient has some confusion and cannot confirm that she continues to take Coreg along with her new prescription.  She is afebrile here.  No focal neurologic deficits.  Blood pressure improving with IV fluids.  Doubt sepsis.  Plan for labs and likely admit for monitoring and medication adjustment as  needed.   CT imaging and labs along with plain films reviewed. No acute findings. Plan for obs admit.   Discussed patient's case with [facility] to request admission. Patient and family (if present) updated with plan. Care transferred to [facility] service.  I reviewed all nursing notes, vitals, pertinent old records, EKGs, labs, imaging (as available).  ____________________________________________  FINAL CLINICAL IMPRESSION(S) /  ED DIAGNOSES  Final diagnoses:  Acute metabolic encephalopathy  Cognitive impairment     MEDICATIONS GIVEN DURING THIS VISIT:  Medications  sodium chloride 0.9 % bolus 500 mL (0 mLs Intravenous Stopped 10/05/19 1655)  cefTRIAXone (ROCEPHIN) 1 g in sodium chloride 0.9 % 100 mL IVPB (0 g Intravenous Stopped 10/05/19 2005)  magnesium sulfate IVPB 2 g 50 mL (0 g Intravenous Stopped 10/05/19 2208)  magnesium sulfate IVPB 2 g 50 mL (0 g Intravenous Stopped 10/06/19 1950)    Note:  This document was prepared using Dragon voice recognition software and may include unintentional dictation errors.  Nanda Quinton, MD, [facility] Emergency Medicine    Yousof Alderman, [facility], MD 10/07/19 [phone]

## 2019-10-05 NOTE — ED Triage Notes (Signed)
EMS reports pt from home and has caretakers throughout the week.  [facility] today for ams and low bp.  EMS says when they arrived, pt was lethargic in chair, bp 80/58 and c/o weakness.  CBG 168.  Co2 26.  Denies urinary symptoms, skin warm and dry.  Pt's son told ems that last known well was Sunday night.  Reports pt alert and oriented.  EMS placed pt on o2 at 2liters and started IV in R ac.  EMS started fluid bolus.  Pt also has a bruise to r side of head, yellow in color.  Says hit head on table Saturday.  EMS says pt has history of "mismanaging medications" in the past per [facility].    Pt has only been taking her medications morning and night for the past 3 days.  Pt was suppose to start carvedilol but unsure if she has taken them or not.

## 2019-10-05 NOTE — ED Notes (Signed)
Date and time results received: 10/05/19 1658   Test: Lactic acid Critical Value: 2.0  Name of Provider Notified: Dr Laverta Baltimore

## 2019-10-05 NOTE — Telephone Encounter (Signed)
Patient's son returned my call and said he is not sure why his mom cancelled her stress test.  Amy [name] she has not been taking her mood stablizing medications as she should and this could alter her thinking.  He knows she wants to do what is needed to be cleared to have a back surgery at some point.   I do not see where the stress test was actually scheduled but he said it was.  At any rate, the entire order was cancelled out.  Of note, pt was taken [facility] this afternoon, just arrived there per son with low BP.  Reviewed meds with son.  It is unclear if she was taking Toprol and carvedilol as well as her losartan.  Will forward to Dr. [name] to make here aware/review for follow up.

## 2019-10-05 NOTE — H&P (Signed)
[facility] H&P    Patient Demographics:    Amy [name], is a 84 y.o. female  MRN: [id number]  DOB - [birth year]  Admit Date - 10/05/2019  Referring MD/NP/PA: Nanda Quinton  Outpatient Primary MD for the patient is [name], MD  Patient coming from: Home  Chief complaint-low blood pressure   HPI:    Amy [name]  is a 84 y.o. female, with history of arthritis, depression, diabetes mellitus type 2, hyperlipidemia, breast cancer, chronic systolic CHF, CVA, hyperlipidemia was brought to the hospital for evaluation of low blood pressure at home noticed by [facility] today.  Patient has been feeling weak and lethargic per EMS by home health staff.  Blood pressure reading at home was 80/58.  There was no reported history of syncope.  Patient denies any chest pain or shortness of breath.  As per patient's son, who spoke to the ED physician.  Patient was supposed to be taken off Coreg and started on metoprolol.  And it seems she was taking both Coreg and metoprolol.  Coreg has been discontinued at this time. She denies nausea vomiting or diarrhea. Denies abdominal pain or dysuria. In the ED she was found to have abnormal UA, started on IV ceftriaxone. Urine culture has been obtained. Blood pressure improved after patient received IV fluid given in the ED.   Review of systems:    In addition to the HPI above,   All other systems reviewed and are negative.    Past History of the following :    Past Medical History:  Diagnosis Date  . Anxiety   . Arthritis   . Back pain   . Colon polyps   . Depression   . Diabetes mellitus, type II ([place])    diet controlled  . Hypercholesterolemia   . Invasive ductal carcinoma of breast, female, right ([place])   . Osteopenia 11/26/2016  . Skin-picking disorder    has 3 open wounds on right wrist that are being treated. About a quarter in size.  . Stroke [facility])        Past Surgical History:  Procedure Laterality Date  . APPENDECTOMY    . BACK SURGERY    . BREAST BIOPSY Right 10/09/2016   Procedure: BREAST BIOPSY WITH NEEDLE LOCALIZATION;  Surgeon: [name], MD;  Location: [facility];  Service: General;  Laterality: Right;  . BREAST SURGERY Right   . BUBBLE STUDY  03/23/2019   Procedure: BUBBLE STUDY;  Surgeon: [name], MD;  Location: [facility] ENDOSCOPY;  Service: Cardiovascular;;  . BUNIONECTOMY Bilateral   . FACIAL COSMETIC SURGERY     drooping eyelid and brow  . HERNIA REPAIR Right    Inguinal x2  . KNEE ARTHROSCOPY Right   . SHOULDER SURGERY Right    rotator cuff repair and removal of bone spur  . TEE WITHOUT CARDIOVERSION N/A 03/23/2019   Procedure: TRANSESOPHAGEAL ECHOCARDIOGRAM (TEE);  Surgeon: [name], MD;  Location: [facility] ENDOSCOPY;  Service: Cardiovascular;  Laterality: N/A;  . TEMPOROMANDIBULAR JOINT SURGERY Right   .  TOTAL KNEE ARTHROPLASTY Right 05/05/2018   Procedure: TOTAL KNEE ARTHROPLASTY;  Surgeon: [name], MD;  Location: [facility];  Service: Orthopedics;  Laterality: Right;      Social History:      Social History   Tobacco Use  . Smoking status: Former Smoker    Packs/day: 0.50    Years: 40.00    Pack years: 20.00    Types: Cigarettes    Quit date: 09/04/1995    Years since quitting: 24.1  . Smokeless tobacco: Never Used  Substance Use Topics  . Alcohol use: No       Family History :     Family History  Problem Relation Age of Onset  . Anxiety disorder Mother   . Depression Mother   . Depression Maternal Grandmother   . Alcohol abuse Brother   . Cancer - Other Sister   . ADD / ADHD Neg Hx   . Bipolar disorder Neg Hx   . Dementia Neg Hx   . Drug abuse Neg Hx   . OCD Neg Hx   . Paranoid behavior Neg Hx   . Schizophrenia Neg Hx   . Seizures Neg Hx   . Sexual abuse Neg Hx   . Physical abuse Neg Hx       Home Medications:   Prior to Admission medications   Medication Sig  Start Date End Date Taking? Authorizing Provider  ALPRAZolam Duanne Moron) 0.5 MG tablet Take 1 tablet (0.5 mg total) by mouth at bedtime. 09/01/19   Cloria Spring, MD  aspirin EC 81 MG tablet Take 81 mg by mouth every morning.    [provider]  atorvastatin (LIPITOR) 40 MG tablet Take 1 tablet (40 mg total) by mouth daily at 6 PM. 09/06/19   Frann Rider, NP  calcium carbonate (OSCAL) 1500 (600 Ca) MG TABS tablet Take 600 mg of elemental calcium by mouth daily with supper.     [provider]  carvedilol (COREG) 3.125 MG tablet Take 1 tablet (3.125 mg total) by mouth 2 (two) times daily. 05/19/19 08/17/19  Shirley Friar, PA-C  cholecalciferol (VITAMIN D) 1000 units tablet Take 1 tablet (1,000 Units total) by mouth daily. 10/23/16   Baird Cancer, PA-C  cyanocobalamin (,VITAMIN B-12,) 1000 MCG/ML injection Daily for 4 days; then weekly for 1 month and then monthly after that. 08/19/19   Barton Dubois, MD  escitalopram (LEXAPRO) 20 MG tablet Take 1 tablet (20 mg total) by mouth daily. 09/01/19   Cloria Spring, MD  gabapentin (NEURONTIN) 300 MG capsule Take 1 capsule (300 mg total) by mouth at bedtime. 09/01/19   Cloria Spring, MD  letrozole [facility]) 2.5 MG tablet TAKE 1 TABLET(2.5 MG) BY MOUTH DAILY Patient taking differently: Take 2.5 mg by mouth daily.  07/02/19   Lockamy, Randi L, NP-C  losartan (COZAAR) 25 MG tablet Take 0.5 tablets (12.5 mg total) by mouth at bedtime. 05/19/19 08/17/19  Shirley Friar, PA-C  metFORMIN (GLUCOPHAGE) 500 MG tablet Take 1 tablet (500 mg total) by mouth 2 (two) times daily with a meal. 04/09/19   Nyoka Cowden, Phylis Bougie, NP  metoprolol succinate (TOPROL XL) 25 MG 24 hr tablet Take 1 tablet (25 mg total) by mouth daily. 09/29/19   Fay Records, MD  risperiDONE (RISPERDAL) 1 MG tablet Take 1 tablet (1 mg total) by mouth at bedtime. 09/01/19   Cloria Spring, MD  traZODone (DESYREL) 50 MG tablet Take 1 tablet (50 mg total) by  mouth at bedtime.  09/01/19   Cloria Spring, MD  venlafaxine XR (EFFEXOR-XR) 150 MG 24 hr capsule Take 1 capsule (150 mg total) by mouth 2 (two) times daily. 09/01/19   Cloria Spring, MD     Allergies:     Allergies  Allergen Reactions  . Cymbalta [Duloxetine Hcl] Other (See Comments)    Hallucinated and couldn't think; got worse and worse the longer she took this.     Physical Exam:   Vitals  Blood pressure (!) 132/52, pulse 76, temperature 97.7 F (36.5 C), temperature source Oral, resp. rate 15, height 5\' 4"  (1.626 m), weight 73 kg, SpO2 94 %.  1.  General: Appears in no acute distress  2. Psychiatric: Alert, oriented x3, intact insight and judgment  3. Neurologic: Cranial nerves II through grossly intact,, no focal deficit noted  4. HEENMT:  Atraumatic normocephalic, extraocular muscles are intact  5. Respiratory : Clear to auscultation bilaterally, no wheezing or crackles  6. Cardiovascular : S1-S2, regular, no murmur auscultated  7. Gastrointestinal:  Abdomen is soft, nontender, no organomegaly      Data Review:    CBC Recent Labs  Lab 10/05/19 1526  WBC 7.9  HGB 12.7  HCT 39.9  PLT 214  MCV 96.1  MCH 30.6  MCHC 31.8  RDW 13.0  LYMPHSABS 1.2  MONOABS 0.7  EOSABS 0.3  BASOSABS 0.0   ------------------------------------------------------------------------------------------------------------------  Results for orders placed or performed during the hospital encounter of 10/05/19 (from the past 48 hour(s))  Comprehensive metabolic panel     Status: Abnormal   Collection Time: 10/05/19  3:26 PM  Result Value Ref Range   Sodium 138 135 - 145 mmol/L   Potassium 4.0 3.5 - 5.1 mmol/L   Chloride 101 98 - 111 mmol/L   CO2 28 22 - 32 mmol/L   Glucose, Bld 94 70 - 99 mg/dL    Comment: Glucose reference range applies only to samples taken after fasting for at least 8 hours.   BUN 27 (H) 8 - 23 mg/dL   Creatinine, Ser 1.06 (H) 0.44 - 1.00 mg/dL   Calcium 8.8 (L) 8.9 -  10.3 mg/dL   Total Protein 6.1 (L) 6.5 - 8.1 g/dL   Albumin 3.7 3.5 - 5.0 g/dL   AST 16 15 - 41 U/L   ALT 13 0 - 44 U/L   Alkaline Phosphatase 54 38 - 126 U/L   Total Bilirubin 0.7 0.3 - 1.2 mg/dL   GFR calc non Af Amer 49 (L) >60 mL/min   GFR calc Af Amer 57 (L) >60 mL/min   Anion gap 9 5 - 15    Comment: Performed at [facility], [address] [place], [place] [address]  Troponin I (High Sensitivity)     Status: None   Collection Time: 10/05/19  3:26 PM  Result Value Ref Range   Troponin I (High Sensitivity) 4 <18 ng/L    Comment: (NOTE) Elevated high sensitivity troponin I (hsTnI) values and significant  changes across serial measurements may suggest ACS but many other  chronic and acute conditions are known to elevate hsTnI results.  Refer to the "Links" section for chest pain algorithms and additional  guidance. Performed at [facility], [address] [place], [place] [address]   Lactic acid, plasma     Status: Abnormal   Collection Time: 10/05/19  3:26 PM  Result Value Ref Range   Lactic Acid, Venous 2.0 (HH) 0.5 - 1.9 mmol/L  Comment: CRITICAL RESULT CALLED TO, READ BACK BY AND VERIFIED WITH: HOLCOM,R AT 1656 ON 3.9.21 BY ISLEY,B Performed at [facility], [address], [place], [place] [address]   CBC with Differential     Status: None   Collection Time: 10/05/19  3:26 PM  Result Value Ref Range   WBC 7.9 4.0 - 10.5 K/uL   RBC 4.15 3.87 - 5.11 MIL/uL   Hemoglobin 12.7 12.0 - 15.0 g/dL   HCT 39.9 36.0 - 46.0 %   MCV 96.1 80.0 - 100.0 fL   MCH 30.6 26.0 - 34.0 pg   MCHC 31.8 30.0 - 36.0 g/dL   RDW 13.0 11.5 - 15.5 %   Platelets 214 150 - 400 K/uL   nRBC 0.0 0.0 - 0.2 %   Neutrophils Relative % 72 %   Neutro Abs 5.7 1.7 - 7.7 K/uL   Lymphocytes Relative 15 %   Lymphs Abs 1.2 0.7 - 4.0 K/uL   Monocytes Relative 9 %   Monocytes Absolute 0.7 0.1 - 1.0 K/uL   Eosinophils Relative 3 %   Eosinophils Absolute 0.3 0.0 - 0.5 K/uL   Basophils Relative 0 %    Basophils Absolute 0.0 0.0 - 0.1 K/uL   Immature Granulocytes 1 %   Abs Immature Granulocytes 0.04 0.00 - 0.07 K/uL    Comment: Performed at [facility], [address], [place], [place] [address]  TSH     Status: None   Collection Time: 10/05/19  3:26 PM  Result Value Ref Range   TSH 1.125 0.350 - 4.500 uIU/mL    Comment: Performed by a 3rd Generation assay with a functional sensitivity of <=0.01 uIU/mL. Performed at [facility], [address] [place], [place] [address]   Magnesium     Status: Abnormal   Collection Time: 10/05/19  3:26 PM  Result Value Ref Range   Magnesium 1.5 (L) 1.7 - 2.4 mg/dL    Comment: Performed at [facility], [address] [place], [place] [address]  Lactic acid, plasma     Status: None   Collection Time: 10/05/19  5:11 PM  Result Value Ref Range   Lactic Acid, Venous 1.1 0.5 - 1.9 mmol/L    Comment: Performed at [facility], [address] [place], [place] [address]  Troponin I (High Sensitivity)     Status: None   Collection Time: 10/05/19  5:11 PM  Result Value Ref Range   Troponin I (High Sensitivity) 5 <18 ng/L    Comment: (NOTE) Elevated high sensitivity troponin I (hsTnI) values and significant  changes across serial measurements may suggest ACS but many other  chronic and acute conditions are known to elevate hsTnI results.  Refer to the "Links" section for chest pain algorithms and additional  guidance. Performed at [facility], [address] [place], [place] [address]   Urinalysis, Routine w reflex microscopic     Status: Abnormal   Collection Time: 10/05/19  5:59 PM  Result Value Ref Range   Color, Urine YELLOW YELLOW   APPearance HAZY (A) CLEAR   Specific Gravity, Urine 1.015 1.005 - 1.030   pH 5.0 5.0 - 8.0   Glucose, UA NEGATIVE NEGATIVE mg/dL   Hgb urine dipstick NEGATIVE NEGATIVE   Bilirubin Urine NEGATIVE NEGATIVE   Ketones, ur NEGATIVE NEGATIVE mg/dL   Protein, ur NEGATIVE NEGATIVE mg/dL   Nitrite POSITIVE (A) NEGATIVE    Leukocytes,Ua TRACE (A) NEGATIVE   RBC / HPF 0-5 0 - 5 RBC/hpf   WBC,  UA 6-10 0 - 5 WBC/hpf   Bacteria, UA RARE (A) NONE SEEN   Mucus PRESENT    Hyaline Casts, UA PRESENT     Comment: Performed at [facility], [address], [place], [place] [address]    Chemistries  Recent Labs  Lab 10/05/19 1526  NA 138  K 4.0  CL 101  CO2 28  GLUCOSE 94  BUN 27*  CREATININE 1.06*  CALCIUM 8.8*  MG 1.5*  AST 16  ALT 13  ALKPHOS 54  BILITOT 0.7   ------------------------------------------------------------------------------------------------------------------  ------------------------------------------------------------------------------------------------------------------ GFR: Estimated Creatinine Clearance: 40.7 mL/min (A) (by C-G formula based on SCr of 1.06 mg/dL (H)). Liver Function Tests: Recent Labs  Lab 10/05/19 1526  AST 16  ALT 13  ALKPHOS 54  BILITOT 0.7  PROT 6.1*  ALBUMIN 3.7   No results for input(s): LIPASE, AMYLASE in the last 168 hours. No results for input(s): AMMONIA in the last 168 hours. Coagulation Profile: No results for input(s): INR, PROTIME in the last 168 hours. Cardiac Enzymes: No results for input(s): CKTOTAL, CKMB, CKMBINDEX, TROPONINI in the last 168 hours. BNP (last 3 results) No results for input(s): PROBNP in the last 8760 hours. HbA1C: No results for input(s): HGBA1C in the last 72 hours. CBG: No results for input(s): GLUCAP in the last 168 hours. Lipid Profile: No results for input(s): CHOL, HDL, LDLCALC, TRIG, CHOLHDL, LDLDIRECT in the last 72 hours. Thyroid Function Tests: Recent Labs    10/05/19 1526  TSH 1.125   Anemia Panel: No results for input(s): VITAMINB12, FOLATE, FERRITIN, TIBC, IRON, RETICCTPCT in the last 72 hours.  --------------------------------------------------------------------------------------------------------------- Urine analysis:    Component Value Date/Time   COLORURINE YELLOW 10/05/2019 1759    APPEARANCEUR HAZY (A) 10/05/2019 1759   LABSPEC 1.015 10/05/2019 1759   PHURINE 5.0 10/05/2019 1759   GLUCOSEU NEGATIVE 10/05/2019 1759   HGBUR NEGATIVE 10/05/2019 1759   BILIRUBINUR NEGATIVE 10/05/2019 1759   KETONESUR NEGATIVE 10/05/2019 1759   PROTEINUR NEGATIVE 10/05/2019 1759   NITRITE POSITIVE (A) 10/05/2019 1759   LEUKOCYTESUR TRACE (A) 10/05/2019 1759      Imaging Results:    CT Head Wo Contrast  Result Date: 10/05/2019 CLINICAL DATA:  Lethargy, headache, weakness EXAM: CT HEAD WITHOUT CONTRAST TECHNIQUE: Contiguous axial images were obtained from the base of the skull through the vertex without intravenous contrast. COMPARISON:  08/16/2019 FINDINGS: Brain: No evidence of acute infarction, hemorrhage, hydrocephalus, extra-axial collection or mass lesion/mass effect. Extensive low-density changes within the periventricular and subcortical white matter compatible with chronic microvascular ischemic change. Mild diffuse cerebral volume loss. Vascular: Atherosclerotic calcifications involving the large vessels of the skull base. No unexpected hyperdense vessel. Skull: Normal. Negative for fracture or focal lesion. Sinuses/Orbits: No acute finding. Other: None. IMPRESSION: 1.  No acute intracranial findings. 2.  Chronic microvascular ischemic change and cerebral volume loss. Electronically Signed   By: Davina Poke D.O.   On: 10/05/2019 16:28   CT Cervical Spine Wo Contrast  Result Date: 10/05/2019 CLINICAL DATA:  Fall 4 days ago striking head. Polytrauma, critical, head/C-spine injury suspected EXAM: CT CERVICAL SPINE WITHOUT CONTRAST TECHNIQUE: Multidetector CT imaging of the cervical spine was performed without intravenous contrast. Multiplanar CT image reconstructions were also generated. COMPARISON:  No prior CT. FINDINGS: Alignment: Patient is tilted in the scanner with broad-based leftward curvature. Grade 1 anterolisthesis of C3 on C4, minimal retrolisthesis of C5 on C6, and  anterolisthesis of C7 on T1 all appear degenerative and facet mediated. No evidence of traumatic  subluxation. Skull base and vertebrae: No acute fracture. Vertebral body heights are maintained. The dens and skull base are intact. Degenerative fragmentation at C1-C2. Soft tissues and spinal canal: No prevertebral fluid or swelling. No visible canal hematoma. Disc levels: Disc space narrowing and endplate spurring from [id number] through C6-C7, most prominent at C5-C6. Prominent multilevel facet hypertrophy. Upper chest: No acute findings allowing for motion. 3 mm right upper lobe pulmonary nodule, series 3, image 83, stable dating back to 11/13/2016 chest CT and considered benign. Other: None. IMPRESSION: Degenerative change in the cervical spine without acute fracture or subluxation. Broad-based leftward curvature may be scoliosis or positional. Electronically Signed   By: Keith Rake M.D.   On: 10/05/2019 16:32   DG Chest Portable 1 View  Result Date: 10/05/2019 CLINICAL DATA:  Altered mental status EXAM: PORTABLE CHEST 1 VIEW COMPARISON:  05/20/2019 FINDINGS: The heart size and mediastinal contours are within normal limits. Calcific aortic knob with tortuosity of the descending thoracic aorta. No focal airspace consolidation, pleural effusion, or pneumothorax. Partially visualized thoracolumbar spinal fusion hardware. IMPRESSION: No acute cardiopulmonary findings. Electronically Signed   By: Davina Poke D.O.   On: 10/05/2019 16:13   DG Hand Complete Left  Result Date: 10/05/2019 CLINICAL DATA:  Bruising, fall EXAM: LEFT HAND - COMPLETE 3+ VIEW COMPARISON:  None. FINDINGS: No fracture or malalignment. Advanced degenerative change at the first [facility] joint. Mild cartilaginous calcification. IMPRESSION: No acute osseous abnormality. Electronically Signed   By: Donavan Foil M.D.   On: 10/05/2019 16:13    My personal review of EKG: Rhythm NSR, no ST changes.   Assessment & Plan:    Active Problems:    UTI (urinary tract infection)   1. Hypotension-likely medication induced, patient was on both Coreg and metoprolol at home.  Will discontinue Coreg, continue with metoprolol.  Blood pressure has improved after patient received IV fluids in the ED.  I doubt she has sepsis.  We will also hold Cozaar at this time.  Will check orthostatic vital signs every shift.  Her medications need to be reconciled at the time of discharge. 2. History of hypertension-continue metoprolol, Cozaar on hold due to above. 3. UTI-patient found to have abnormal UA, no dysuria.  Started on IV ceftriaxone.  Urine cultures obtained.  Follow urine culture results.  Continue with IV ceftriaxone. 4. Diabetes mellitus type 2-hold Metformin, will start sliding scale insulin with NovoLog. 5. Hypomagnesemia-magnesium is 1.5.  Will give 2 g of mag sulfate IV x1.  Follow serum magnesium level in a.m. 6. Hyperlipidemia-continue Lipitor 7. Depression-continue Effexor XR   DVT Prophylaxis-   Lovenox   AM Labs Ordered, also please review Full Orders  Family Communication: Admission, patients condition and plan of care including tests being ordered have been discussed with the patient  who indicate understanding and agree with the plan and Code Status.  Code Status: Full code  Admission status: Observation  Time spent in minutes : 60 minutes   Ellenore Roscoe S Kammie Scioli M.D

## 2019-10-05 NOTE — ED Notes (Signed)
ED TO INPATIENT HANDOFF REPORT  ED Nurse Name and Phone #: [phone]  S Name/Age/Gender Amy [name] 84 y.o. female Room/Bed: APA17/APA17  Code Status   Code Status: Prior  Home/SNF/Other Home Patient oriented to: place Is this baseline? Yes   Triage Complete: Triage complete  Chief Complaint Hypotension  Triage Note EMS reports pt from home and has caretakers throughout the week.  [facility] today for ams and low bp.  EMS says when they arrived, pt was lethargic in chair, bp 80/58 and c/o weakness.  CBG 168.  Co2 26.  Denies urinary symptoms, skin warm and dry.  Pt's son told ems that last known well was Sunday night.  Reports pt alert and oriented.  EMS placed pt on o2 at 2liters and started IV in R ac.  EMS started fluid bolus.  Pt also has a bruise to r side of head, yellow in color.  Says hit head on table Saturday.  EMS says pt has history of "mismanaging medications" in the past per [facility].    Pt has only been taking her medications morning and night for the past 3 days.  Pt was suppose to start carvedilol but unsure if she has taken them or not.      Allergies Allergies  Allergen Reactions  . Cymbalta [Duloxetine Hcl] Other (See Comments)    Hallucinated and couldn't think; got worse and worse the longer she took this.    Level of Care/Admitting Diagnosis ED Disposition    ED Disposition Condition Comment   Admit  The patient appears reasonably stabilized for admission considering the current resources, flow, and capabilities available in the ED at this time, and I doubt any other [facility] requiring further screening and/or treatment in the ED prior to admission is  present.       B Medical/Surgery History Past Medical History:  Diagnosis Date  . Anxiety   . Arthritis   . Back pain   . Colon polyps   . Depression   . Diabetes mellitus, type II ([place])    diet controlled  . Hypercholesterolemia   . Invasive ductal  carcinoma of breast, female, right ([place])   . Osteopenia 11/26/2016  . Skin-picking disorder    has 3 open wounds on right wrist that are being treated. About a quarter in size.  . Stroke [facility])    Past Surgical History:  Procedure Laterality Date  . APPENDECTOMY    . BACK SURGERY    . BREAST BIOPSY Right 10/09/2016   Procedure: BREAST BIOPSY WITH NEEDLE LOCALIZATION;  Surgeon: [name], MD;  Location: [facility];  Service: General;  Laterality: Right;  . BREAST SURGERY Right   . BUBBLE STUDY  03/23/2019   Procedure: BUBBLE STUDY;  Surgeon: [name], MD;  Location: [facility] ENDOSCOPY;  Service: Cardiovascular;;  . BUNIONECTOMY Bilateral   . FACIAL COSMETIC SURGERY     drooping eyelid and brow  . HERNIA REPAIR Right    Inguinal x2  . KNEE ARTHROSCOPY Right   . SHOULDER SURGERY Right    rotator cuff repair and removal of bone spur  . TEE WITHOUT CARDIOVERSION N/A 03/23/2019   Procedure: TRANSESOPHAGEAL ECHOCARDIOGRAM (TEE);  Surgeon: [name], MD;  Location: [facility] ENDOSCOPY;  Service: Cardiovascular;  Laterality: N/A;  . TEMPOROMANDIBULAR JOINT SURGERY Right   . TOTAL KNEE ARTHROPLASTY Right 05/05/2018   Procedure: TOTAL KNEE ARTHROPLASTY;  Surgeon: [name], MD;  Location: [facility];  Service:  Orthopedics;  Laterality: Right;     A IV Location/Drains/Wounds Patient Lines/Drains/Airways Status   Active Line/Drains/Airways    Name:   Placement date:   Placement time:   Site:   Days:   Peripheral IV 10/05/19 Right Antecubital   10/05/19    1438    Antecubital   less than 1   External Urinary Catheter   08/16/19    2100    --   50   Incision (Closed) 01/30/18 Back   01/30/18    2200     613   Incision (Closed) 05/05/18 Knee Right   05/05/18    0900     518          Intake/Output Last 24 hours  Intake/Output Summary (Last 24 hours) at 10/05/2019 1942 Last data filed at 10/05/2019 1655 Gross per 24 hour  Intake 500 ml  Output --  Net 500 ml     Labs/Imaging Results for orders placed or performed during the hospital encounter of 10/05/19 (from the past 48 hour(s))  Comprehensive metabolic panel     Status: Abnormal   Collection Time: 10/05/19  3:26 PM  Result Value Ref Range   Sodium 138 135 - 145 mmol/L   Potassium 4.0 3.5 - 5.1 mmol/L   Chloride 101 98 - 111 mmol/L   CO2 28 22 - 32 mmol/L   Glucose, Bld 94 70 - 99 mg/dL    Comment: Glucose reference range applies only to samples taken after fasting for at least 8 hours.   BUN 27 (H) 8 - 23 mg/dL   Creatinine, Ser 1.06 (H) 0.44 - 1.00 mg/dL   Calcium 8.8 (L) 8.9 - 10.3 mg/dL   Total Protein 6.1 (L) 6.5 - 8.1 g/dL   Albumin 3.7 3.5 - 5.0 g/dL   AST 16 15 - 41 U/L   ALT 13 0 - 44 U/L   Alkaline Phosphatase 54 38 - 126 U/L   Total Bilirubin 0.7 0.3 - 1.2 mg/dL   GFR calc non Af Amer 49 (L) >60 mL/min   GFR calc Af Amer 57 (L) >60 mL/min   Anion gap 9 5 - 15    Comment: Performed at [facility], [address] [place], [place] [address]  Troponin I (High Sensitivity)     Status: None   Collection Time: 10/05/19  3:26 PM  Result Value Ref Range   Troponin I (High Sensitivity) 4 <18 ng/L    Comment: (NOTE) Elevated high sensitivity troponin I (hsTnI) values and significant  changes across serial measurements may suggest ACS but many other  chronic and acute conditions are known to elevate hsTnI results.  Refer to the "Links" section for chest pain algorithms and additional  guidance. Performed at [facility], [address], [place], [place] [address]   Lactic acid, plasma     Status: Abnormal   Collection Time: 10/05/19  3:26 PM  Result Value Ref Range   Lactic Acid, Venous 2.0 (HH) 0.5 - 1.9 mmol/L    Comment: CRITICAL RESULT CALLED TO, READ BACK BY AND VERIFIED WITH: HOLCOM,R AT 1656 ON 3.9.21 BY ISLEY,B Performed at [facility], [address] [place], [place] [address]   CBC with Differential     Status: None   Collection Time: 10/05/19  3:26 PM  Result  Value Ref Range   WBC 7.9 4.0 - 10.5 K/uL   RBC 4.15 3.87 - 5.11 MIL/uL   Hemoglobin 12.7 12.0 - 15.0 g/dL   HCT  39.9 36.0 - 46.0 %   MCV 96.1 80.0 - 100.0 fL   MCH 30.6 26.0 - 34.0 pg   MCHC 31.8 30.0 - 36.0 g/dL   RDW 13.0 11.5 - 15.5 %   Platelets 214 150 - 400 K/uL   nRBC 0.0 0.0 - 0.2 %   Neutrophils Relative % 72 %   Neutro Abs 5.7 1.7 - 7.7 K/uL   Lymphocytes Relative 15 %   Lymphs Abs 1.2 0.7 - 4.0 K/uL   Monocytes Relative 9 %   Monocytes Absolute 0.7 0.1 - 1.0 K/uL   Eosinophils Relative 3 %   Eosinophils Absolute 0.3 0.0 - 0.5 K/uL   Basophils Relative 0 %   Basophils Absolute 0.0 0.0 - 0.1 K/uL   Immature Granulocytes 1 %   Abs Immature Granulocytes 0.04 0.00 - 0.07 K/uL    Comment: Performed at [facility], [address], [place], [place] [address]  TSH     Status: None   Collection Time: 10/05/19  3:26 PM  Result Value Ref Range   TSH 1.125 0.350 - 4.500 uIU/mL    Comment: Performed by a 3rd Generation assay with a functional sensitivity of <=0.01 uIU/mL. Performed at [facility], [address], [place], [place] [address]   Magnesium     Status: Abnormal   Collection Time: 10/05/19  3:26 PM  Result Value Ref Range   Magnesium 1.5 (L) 1.7 - 2.4 mg/dL    Comment: Performed at [facility], [address], [place], [place] [address]  Lactic acid, plasma     Status: None   Collection Time: 10/05/19  5:11 PM  Result Value Ref Range   Lactic Acid, Venous 1.1 0.5 - 1.9 mmol/L    Comment: Performed at [facility], [address] [place], [place] [address]  Troponin I (High Sensitivity)     Status: None   Collection Time: 10/05/19  5:11 PM  Result Value Ref Range   Troponin I (High Sensitivity) 5 <18 ng/L    Comment: (NOTE) Elevated high sensitivity troponin I (hsTnI) values and significant  changes across serial measurements may suggest ACS but many other  chronic and acute conditions are known to elevate hsTnI results.  Refer to the "Links" section for chest  pain algorithms and additional  guidance. Performed at [facility], [address] [place], [place] [address]   Urinalysis, Routine w reflex microscopic     Status: Abnormal   Collection Time: 10/05/19  5:59 PM  Result Value Ref Range   Color, Urine YELLOW YELLOW   APPearance HAZY (A) CLEAR   Specific Gravity, Urine 1.015 1.005 - 1.030   pH 5.0 5.0 - 8.0   Glucose, UA NEGATIVE NEGATIVE mg/dL   Hgb urine dipstick NEGATIVE NEGATIVE   Bilirubin Urine NEGATIVE NEGATIVE   Ketones, ur NEGATIVE NEGATIVE mg/dL   Protein, ur NEGATIVE NEGATIVE mg/dL   Nitrite POSITIVE (A) NEGATIVE   Leukocytes,Ua TRACE (A) NEGATIVE   RBC / HPF 0-5 0 - 5 RBC/hpf   WBC, UA 6-10 0 - 5 WBC/hpf   Bacteria, UA RARE (A) NONE SEEN   Mucus PRESENT    Hyaline Casts, UA PRESENT     Comment: Performed at [facility], [address] [place], [place] [address]   CT Head Wo Contrast  Result Date: 10/05/2019 CLINICAL DATA:  Lethargy, headache, weakness EXAM: CT HEAD WITHOUT CONTRAST TECHNIQUE: Contiguous axial images were obtained from the base of the skull through the vertex without intravenous contrast. COMPARISON:  08/16/2019 FINDINGS: Brain: No evidence of acute infarction, hemorrhage, hydrocephalus, extra-axial collection or mass lesion/mass effect. Extensive low-density changes within the periventricular and subcortical white matter compatible with chronic microvascular ischemic change. Mild diffuse cerebral volume loss. Vascular: Atherosclerotic calcifications involving the large vessels of the skull base. No unexpected hyperdense vessel. Skull: Normal. Negative for fracture or focal lesion. Sinuses/Orbits: No acute finding. Other: None. IMPRESSION: 1.  No acute intracranial findings. 2.  Chronic microvascular ischemic change and cerebral volume loss. Electronically Signed   By: Davina Poke D.O.   On: 10/05/2019 16:28   CT Cervical Spine Wo Contrast  Result Date: 10/05/2019 CLINICAL DATA:  Fall 4 days ago striking  head. Polytrauma, critical, head/C-spine injury suspected EXAM: CT CERVICAL SPINE WITHOUT CONTRAST TECHNIQUE: Multidetector CT imaging of the cervical spine was performed without intravenous contrast. Multiplanar CT image reconstructions were also generated. COMPARISON:  No prior CT. FINDINGS: Alignment: Patient is tilted in the scanner with broad-based leftward curvature. Grade 1 anterolisthesis of C3 on C4, minimal retrolisthesis of C5 on C6, and anterolisthesis of C7 on T1 all appear degenerative and facet mediated. No evidence of traumatic subluxation. Skull base and vertebrae: No acute fracture. Vertebral body heights are maintained. The dens and skull base are intact. Degenerative fragmentation at C1-C2. Soft tissues and spinal canal: No prevertebral fluid or swelling. No visible canal hematoma. Disc levels: Disc space narrowing and endplate spurring from [id number] through C6-C7, most prominent at C5-C6. Prominent multilevel facet hypertrophy. Upper chest: No acute findings allowing for motion. 3 mm right upper lobe pulmonary nodule, series 3, image 83, stable dating back to 11/13/2016 chest CT and considered benign. Other: None. IMPRESSION: Degenerative change in the cervical spine without acute fracture or subluxation. Broad-based leftward curvature may be scoliosis or positional. Electronically Signed   By: Keith Rake M.D.   On: 10/05/2019 16:32   DG Chest Portable 1 View  Result Date: 10/05/2019 CLINICAL DATA:  Altered mental status EXAM: PORTABLE CHEST 1 VIEW COMPARISON:  05/20/2019 FINDINGS: The heart size and mediastinal contours are within normal limits. Calcific aortic knob with tortuosity of the descending thoracic aorta. No focal airspace consolidation, pleural effusion, or pneumothorax. Partially visualized thoracolumbar spinal fusion hardware. IMPRESSION: No acute cardiopulmonary findings. Electronically Signed   By: Davina Poke D.O.   On: 10/05/2019 16:13   DG Hand Complete  Left  Result Date: 10/05/2019 CLINICAL DATA:  Bruising, fall EXAM: LEFT HAND - COMPLETE 3+ VIEW COMPARISON:  None. FINDINGS: No fracture or malalignment. Advanced degenerative change at the first [facility] joint. Mild cartilaginous calcification. IMPRESSION: No acute osseous abnormality. Electronically Signed   By: Donavan Foil M.D.   On: 10/05/2019 16:13    Pending Labs Unresulted Labs (From admission, onward)    Start     Ordered   10/05/19 1510  Urine culture  ONCE - STAT,   STAT     10/05/19 1510   10/05/19 1510  SARS CORONAVIRUS 2 (TAT 6-24 HRS) Nasopharyngeal Nasopharyngeal Swab  (Tier 3 (TAT 6-24 hrs))  ONCE - STAT,   STAT    Question Answer Comment  Is this test for diagnosis or screening Screening   Symptomatic for COVID-19 as defined by CDC No   Hospitalized for COVID-19 No   Admitted to ICU for COVID-19 No   Previously tested for COVID-19 Yes   Resident in a congregate (group) care setting No   Employed in healthcare setting No   Pregnant No      10/05/19 1510  Vitals/Pain Today's Vitals   10/05/19 1800 10/05/19 1830 10/05/19 1900 10/05/19 1913  BP: (!) 149/70 (!) 144/70 (!) 132/52   Pulse: 71 75 76   Resp: 16 19 15    Temp:      TempSrc:      SpO2: 96% 98% 94%   Weight:      Height:      PainSc:    0-No pain    Isolation Precautions No active isolations  Medications Medications  cefTRIAXone (ROCEPHIN) 1 g in sodium chloride 0.9 % 100 mL IVPB (1 g Intravenous New Bag/Given 10/05/19 1935)  sodium chloride 0.9 % bolus 500 mL (0 mLs Intravenous Stopped 10/05/19 1655)    Mobility walks with device Moderate fall risk   Focused Assessments    R Recommendations: See Admitting Provider Note  Report given to:   Additional Notes: Pt is on pure wick

## 2019-10-05 NOTE — ED Notes (Signed)
PT's son called and reports pt was supposed to stop taking coreg around 2 months ago and didn't relay that information to her son. PT was started on Metoprolol 25mg  daily this week and was still taking the coreg that was supposed to be d/c.

## 2019-10-05 NOTE — Telephone Encounter (Signed)
Order for lexiscan has been cancelled reason: patient I called the patient's son and left VM for him to call back to discuss.   The patient has f/u with Dr. [name] later this month.

## 2019-10-06 DIAGNOSIS — G9341 Metabolic encephalopathy: Secondary | ICD-10-CM

## 2019-10-06 DIAGNOSIS — R5381 Other malaise: Secondary | ICD-10-CM

## 2019-10-06 DIAGNOSIS — C50911 Malignant neoplasm of unspecified site of right female breast: Secondary | ICD-10-CM

## 2019-10-06 DIAGNOSIS — I5022 Chronic systolic (congestive) heart failure: Secondary | ICD-10-CM

## 2019-10-06 LAB — COMPREHENSIVE METABOLIC PANEL
ALT: 12 U/L (ref 0–44)
AST: 15 U/L (ref 15–41)
Albumin: 3.5 g/dL (ref 3.5–5.0)
Alkaline Phosphatase: 49 U/L (ref 38–126)
Anion gap: 9 (ref 5–15)
BUN: 23 mg/dL (ref 8–23)
CO2: 29 mmol/L (ref 22–32)
Calcium: 8.9 mg/dL (ref 8.9–10.3)
Chloride: 102 mmol/L (ref 98–111)
Creatinine, Ser: 0.85 mg/dL (ref 0.44–1.00)
GFR calc Af Amer: >60 mL/min (ref >60)
GFR calc non Af Amer: >60 mL/min (ref >60)
Glucose, Bld: 126 mg/dL — ABNORMAL HIGH (ref 70–99)
Potassium: 4.5 mmol/L (ref 3.5–5.1)
Sodium: 140 mmol/L (ref 135–145)
Total Bilirubin: 0.5 mg/dL (ref 0.3–1.2)
Total Protein: 5.8 g/dL — ABNORMAL LOW (ref 6.5–8.1)

## 2019-10-06 LAB — CBC
HCT: 39.4 % (ref 36.0–46.0)
Hemoglobin: 12.6 g/dL (ref 12.0–15.0)
MCH: 30.6 pg (ref 26.0–34.0)
MCHC: 32 g/dL (ref 30.0–36.0)
MCV: 95.6 fL (ref 80.0–100.0)
Platelets: 208 10*3/uL (ref 150–400)
RBC: 4.12 MIL/uL (ref 3.87–5.11)
RDW: 12.7 % (ref 11.5–15.5)
WBC: 7.9 10*3/uL (ref 4.0–10.5)
nRBC: 0 % (ref 0.0–0.2)

## 2019-10-06 LAB — SARS CORONAVIRUS 2 (TAT 6-24 HRS): SARS Coronavirus 2: NEGATIVE

## 2019-10-06 LAB — GLUCOSE, CAPILLARY
Glucose-Capillary: 109 mg/dL — ABNORMAL HIGH (ref 70–99)
Glucose-Capillary: 115 mg/dL — ABNORMAL HIGH (ref 70–99)
Glucose-Capillary: 146 mg/dL — ABNORMAL HIGH (ref 70–99)
Glucose-Capillary: 76 mg/dL (ref 70–99)
Glucose-Capillary: 144 mg/dL — ABNORMAL HIGH (ref 70–99)

## 2019-10-06 LAB — HEMOGLOBIN A1C
Hgb A1c MFr Bld: 5.8 % — ABNORMAL HIGH (ref 4.8–5.6)
Mean Plasma Glucose: 119.76 mg/dL

## 2019-10-06 LAB — MAGNESIUM: Magnesium: 1.5 mg/dL — ABNORMAL LOW (ref 1.7–2.4)

## 2019-10-06 MED ORDER — MAGNESIUM SULFATE 2 GM/50ML IV SOLN
2.0000 g | Freq: Once | INTRAVENOUS | Status: AC
  Administered 2019-10-06: 2 g via INTRAVENOUS
  Filled 2019-10-06: qty 50

## 2019-10-06 NOTE — Care Management Obs Status (Signed)
[facility] NOTIFICATION   Patient Details  Name: Amy [name] MRN: [id number] Date of Birth: 02/28/1936   Medicare Observation Status Notification Given:  Yes    Tommy Medal 10/06/2019, 2:47 PM

## 2019-10-06 NOTE — Progress Notes (Signed)
Pt assisted up to chair per her request. Needed standby assistance only. Denies c/o. Purewick intact draining clear yellow urine. IV Mag Sulfate started per order, infusing without s/s infiltration. Call bell in pt's reach, chair alarm on for safety.

## 2019-10-06 NOTE — Progress Notes (Signed)
PROGRESS NOTE  Amy [name] [id number] DOB: Jan 26, 1936 DOA: 10/05/2019 PCP: [name], MD  Brief History:  84 year old female with a history of cognitive impairment, diabetes mellitus type 2, systolic CHF, cryptogenic stroke, hyperlipidemia, depression presenting with generalized weakness, lethargy.  The patient was evaluated by her home health RN on the day of admission, and the patient was noted to have a blood pressure of 80/58.  She was noted to have some bruising on her right forehead as well as some lethargy.  As result, the patient was brought to emergency department by EMS.  The patient normally doses of oral medications, but her medications are usually prepared by her son.  Approximately 1 week prior to this admission, the patient was instructed to start metoprolol succinate 25 mg daily.  Unfortunately, the patient continued to take metoprolol succinate in addition to carvedilol which she was on previously.  In the emergency department, the patient was noted to have soft blood pressure with systolic in the 0000000.  She was afebrile with oxygen saturation 98% on room air.  BMP and CBC were unremarkable.  CT of the brain was negative.  CT of the cervical spine was negative for fracture or subluxation.  Assessment/Plan: Acute metabolic encephalopathy -Likely secondary to the patient's hypotension -Mental status is improved and likely at baseline -Serum 123456 -TSH -Folic acid -UA neg for pyuria  Hypotension -Likely secondary to polypharmacy and confusion with carvedilol and metoprolol succinate -Discontinue carvedilol -Continue metoprolol succinate  Chronic systolic and diastolic CHF -123XX123 EF -Daily weights -Continue metoprolol succinate  Hypomagnesemia -Replete -A.m. magnesium  Diabetes mellitus type 2, controlled -10/05/2019 hemoglobin A1c 5.8 -Discontinue CBGs -Juice discontinue NovoLog sliding scale -hold metformin  Hyperlipidemia -Continue  statin  Depression/anxiety -Continue trazodone and Effexor -Continue home dose alprazolam  Gait instability/deconditioning -PT evaluation      Disposition Plan: Patient From: Home D/C Place: Home- 3/11 if mental status and bp improved Barriers: Not Clinically Stable--  Family Communication:   Family at bedside  Consultants:  none  Code Status:  FULL  DVT Prophylaxis:  [place] Lovenox   Procedures: As Listed in Progress Note Above  Antibiotics: None       Subjective: Patient denies fevers, chills, headache, chest pain, dyspnea, nausea, vomiting, diarrhea, abdominal pain, dysuria, hematuria, hematochezia, and melena.   Objective: Vitals:   10/06/19 0454 10/06/19 0827 10/06/19 1120 10/06/19 1404  BP: (!) 108/48  (!) 113/49 116/68  Pulse: 74  80 72  Resp: 17  20 16   Temp: 98.2 F (36.8 C)   97.7 F (36.5 C)  TempSrc: Axillary   Oral  SpO2: 96% 96% 95% 97%  Weight:      Height:        Intake/Output Summary (Last 24 hours) at 10/06/2019 1717 Last data filed at 10/06/2019 1624 Gross per 24 hour  Intake 483 ml  Output 1000 ml  Net -517 ml   Weight change:  Exam:   General:  Pt is alert, follows commands appropriately, not in acute distress  HEENT: No icterus, No thrush, No neck mass, [place]/AT  Cardiovascular: RRR, S1/S2, no rubs, no gallops  Respiratory: bibasilar rales. No wheeze  Abdomen: Soft/+BS, non tender, non distended, no guarding  Extremities: No edema, No lymphangitis, No petechiae, No rashes, no synovitis   Data Reviewed: I have personally reviewed following labs and imaging studies Basic Metabolic Panel: Recent Labs  Lab 10/05/19 1526 10/06/19 0445  NA 138 140  K 4.0 4.5  CL 101 102  CO2 28 29  GLUCOSE 94 126*  BUN 27* 23  CREATININE 1.06* 0.85  CALCIUM 8.8* 8.9  MG 1.5* 1.5*   Liver Function Tests: Recent Labs  Lab 10/05/19 1526 10/06/19 0445  AST 16 15  ALT 13 12  ALKPHOS 54 49  BILITOT 0.7 0.5  PROT 6.1* 5.8*   ALBUMIN 3.7 3.5   No results for input(s): LIPASE, AMYLASE in the last 168 hours. No results for input(s): AMMONIA in the last 168 hours. Coagulation Profile: No results for input(s): INR, PROTIME in the last 168 hours. CBC: Recent Labs  Lab 10/05/19 1526 10/06/19 0445  WBC 7.9 7.9  NEUTROABS 5.7  --   HGB 12.7 12.6  HCT 39.9 39.4  MCV 96.1 95.6  PLT 214 208   Cardiac Enzymes: No results for input(s): CKTOTAL, CKMB, CKMBINDEX, TROPONINI in the last 168 hours. BNP: Invalid input(s): POCBNP CBG: Recent Labs  Lab 10/05/19 2251 10/06/19 0744 10/06/19 1110 10/06/19 1618  GLUCAP 109* 115* 146* 76   HbA1C: Recent Labs    10/05/19 1526  HGBA1C 5.8*   Urine analysis:    Component Value Date/Time   COLORURINE YELLOW 10/05/2019 1759   APPEARANCEUR HAZY (A) 10/05/2019 1759   LABSPEC 1.015 10/05/2019 1759   PHURINE 5.0 10/05/2019 1759   GLUCOSEU NEGATIVE 10/05/2019 1759   HGBUR NEGATIVE 10/05/2019 1759   BILIRUBINUR NEGATIVE 10/05/2019 1759   KETONESUR NEGATIVE 10/05/2019 1759   PROTEINUR NEGATIVE 10/05/2019 1759   NITRITE POSITIVE (A) 10/05/2019 1759   LEUKOCYTESUR TRACE (A) 10/05/2019 1759   Sepsis Labs: @LABRCNTIP (procalcitonin:4,lacticidven:4) ) Recent Results (from the past 240 hour(s))  SARS CORONAVIRUS 2 ([place] 6-24 HRS) Nasopharyngeal Nasopharyngeal Swab     Status: None   Collection Time: 10/05/19  3:34 PM   Specimen: Nasopharyngeal Swab  Result Value Ref Range Status   SARS Coronavirus 2 NEGATIVE NEGATIVE Final    Comment: (NOTE) SARS-CoV-2 target nucleic acids are NOT DETECTED. The SARS-CoV-2 RNA is generally detectable in upper and lower respiratory specimens during the acute phase of infection. Negative results do not preclude SARS-CoV-2 infection, do not rule out co-infections with other pathogens, and should not be used as the sole basis for treatment or other patient management decisions. Negative results must be combined with clinical  observations, patient history, and epidemiological information. The expected result is Negative. Fact Sheet for Patients: SugarRoll.be Fact Sheet for Healthcare Providers: https://www.woods-mathews.com/ This test is not yet approved or cleared by the [place] FDA and  has been authorized for detection and/or diagnosis of SARS-CoV-2 by FDA under an Emergency Use Authorization (EUA). This EUA will remain  in effect (meaning this test can be used) for the duration of the COVID-19 declaration under Section 56 4(b)(1) of the Act, 21 U.S.C. section 360bbb-3(b)(1), unless the authorization is terminated or revoked sooner. Performed at [facility], [place] [address], [place], [place] [address]      Scheduled Meds:  ALPRAZolam  0.5 mg Oral QHS   aspirin EC  81 mg Oral q morning - 10a   atorvastatin  40 mg Oral q1800   enoxaparin (LOVENOX) injection  40 mg Subcutaneous Q24H   gabapentin  300 mg Oral QHS   insulin aspart  0-9 Units Subcutaneous TID WC   letrozole  2.5 mg Oral Daily   metoprolol succinate  25 mg Oral Daily   risperiDONE  1 mg Oral QHS   sodium chloride flush  3 mL Intravenous Q12H  traZODone  50 mg Oral QHS   venlafaxine XR  150 mg Oral BID   Continuous Infusions:  sodium chloride     magnesium sulfate bolus IVPB      Procedures/Studies: CT Head Wo Contrast  Result Date: 10/05/2019 CLINICAL DATA:  Lethargy, headache, weakness EXAM: CT HEAD WITHOUT CONTRAST TECHNIQUE: Contiguous axial images were obtained from the base of the skull through the vertex without intravenous contrast. COMPARISON:  08/16/2019 FINDINGS: Brain: No evidence of acute infarction, hemorrhage, hydrocephalus, extra-axial collection or mass lesion/mass effect. Extensive low-density changes within the periventricular and subcortical white matter compatible with chronic microvascular ischemic change. Mild diffuse cerebral volume loss.  Vascular: Atherosclerotic calcifications involving the large vessels of the skull base. No unexpected hyperdense vessel. Skull: Normal. Negative for fracture or focal lesion. Sinuses/Orbits: No acute finding. Other: None. IMPRESSION: 1.  No acute intracranial findings. 2.  Chronic microvascular ischemic change and cerebral volume loss. Electronically Signed   By: Davina Poke D.O.   On: 10/05/2019 16:28   CT Cervical Spine Wo Contrast  Result Date: 10/05/2019 CLINICAL DATA:  Fall 4 days ago striking head. Polytrauma, critical, head/C-spine injury suspected EXAM: CT CERVICAL SPINE WITHOUT CONTRAST TECHNIQUE: Multidetector CT imaging of the cervical spine was performed without intravenous contrast. Multiplanar CT image reconstructions were also generated. COMPARISON:  No prior CT. FINDINGS: Alignment: Patient is tilted in the scanner with broad-based leftward curvature. Grade 1 anterolisthesis of C3 on C4, minimal retrolisthesis of C5 on C6, and anterolisthesis of C7 on T1 all appear degenerative and facet mediated. No evidence of traumatic subluxation. Skull base and vertebrae: No acute fracture. Vertebral body heights are maintained. The dens and skull base are intact. Degenerative fragmentation at C1-C2. Soft tissues and spinal canal: No prevertebral fluid or swelling. No visible canal hematoma. Disc levels: Disc space narrowing and endplate spurring from [id number] through C6-C7, most prominent at C5-C6. Prominent multilevel facet hypertrophy. Upper chest: No acute findings allowing for motion. 3 mm right upper lobe pulmonary nodule, series 3, image 83, stable dating back to 11/13/2016 chest CT and considered benign. Other: None. IMPRESSION: Degenerative change in the cervical spine without acute fracture or subluxation. Broad-based leftward curvature may be scoliosis or positional. Electronically Signed   By: Keith Rake M.D.   On: 10/05/2019 16:32   DG Chest Portable 1 View  Result Date:  10/05/2019 CLINICAL DATA:  Altered mental status EXAM: PORTABLE CHEST 1 VIEW COMPARISON:  05/20/2019 FINDINGS: The heart size and mediastinal contours are within normal limits. Calcific aortic knob with tortuosity of the descending thoracic aorta. No focal airspace consolidation, pleural effusion, or pneumothorax. Partially visualized thoracolumbar spinal fusion hardware. IMPRESSION: No acute cardiopulmonary findings. Electronically Signed   By: Davina Poke D.O.   On: 10/05/2019 16:13   DG Hand Complete Left  Result Date: 10/05/2019 CLINICAL DATA:  Bruising, fall EXAM: LEFT HAND - COMPLETE 3+ VIEW COMPARISON:  None. FINDINGS: No fracture or malalignment. Advanced degenerative change at the first [facility] joint. Mild cartilaginous calcification. IMPRESSION: No acute osseous abnormality. Electronically Signed   By: Donavan Foil M.D.   On: 10/05/2019 16:13   ECHOCARDIOGRAM COMPLETE  Result Date: 09/27/2019    ECHOCARDIOGRAM REPORT   Patient Name:   Amy [name] Date of Exam: 09/27/2019 Medical Rec #:  [id number]         Height:       64.0 in Accession #:    [id number]        Weight:  161.2 lb Date of Birth:  1936-07-26          BSA:          1.785 m Patient Age:    73 years          BP:           130/82 mmHg Patient Gender: F                 HR:           87 bpm. Exam Location:  [facility] Procedure: 2D Echo Indications:    Congestive Heart Failure 428.0 / I50.9  History:        Patient has prior history of Echocardiogram examinations, most                 recent 03/23/2019. CHF, Stroke; Risk Factors:Diabetes,                 Dyslipidemia and Former Smoker. S/P total knee replacement,                 right.  Sonographer:    Leavy Cella RDCS (AE) Referring Phys: [id number] PAULA V ROSS IMPRESSIONS  1. Left ventricular ejection fraction, by estimation, is 35 to 40%. The left ventricle has moderately decreased function. The left ventricle demonstrates global hypokinesis. Left ventricular diastolic parameters are  consistent with Grade I diastolic dysfunction (impaired relaxation).  2. Right ventricular systolic function is normal. The right ventricular size is normal. Tricuspid regurgitation signal is inadequate for assessing PA pressure.  3. The mitral valve is grossly normal with mild leaflet thickening. Mild mitral valve regurgitation.  4. The aortic valve is tricuspid. Aortic valve regurgitation is trivial.  5. The inferior vena cava is normal in size with greater than 50% respiratory variability, suggesting right atrial pressure of 3 mmHg.  6. Interatrial shunting not evident baed on limited images, although PFO previously reported by [facility] August 2020. FINDINGS  Left Ventricle: Left ventricular ejection fraction, by estimation, is 35 to 40%. The left ventricle has moderately decreased function. The left ventricle demonstrates global hypokinesis. The left ventricular internal cavity size was normal in size. There is no left ventricular hypertrophy. Left ventricular diastolic parameters are consistent with Grade I diastolic dysfunction (impaired relaxation). Right Ventricle: The right ventricular size is normal. No increase in right ventricular wall thickness. Right ventricular systolic function is normal. Tricuspid regurgitation signal is inadequate for assessing PA pressure. Left Atrium: Left atrial size was normal in size. Right Atrium: Right atrial size was normal in size. Pericardium: There is no evidence of pericardial effusion. Presence of pericardial fat pad. Mitral Valve: The mitral valve is grossly normal. There is mild thickening of the mitral valve leaflet(s). Mild mitral valve regurgitation. Tricuspid Valve: The tricuspid valve is grossly normal. Tricuspid valve regurgitation is trivial. Aortic Valve: The aortic valve is tricuspid. Aortic valve regurgitation is trivial. Mild aortic valve annular calcification. Pulmonic Valve: The pulmonic valve was grossly normal. Pulmonic valve regurgitation is trivial.  Aorta: The aortic root is normal in size and structure. Venous: The inferior vena cava is normal in size with greater than 50% respiratory variability, suggesting right atrial pressure of 3 mmHg. IAS/Shunts: No atrial level shunt detected by color flow Doppler.   Diastology LV e' lateral:   4.68 cm/s LV E/e' lateral: 12.6 LV e' medial:    6.53 cm/s LV E/e' medial:  9.0  RIGHT VENTRICLE RV S prime:     10.70 cm/s TAPSE (M-mode): 1.1  cm LEFT ATRIUM           Index       RIGHT ATRIUM          Index LA Vol (A2C): 31.1 ml 17.42 ml/m RA Area:     9.95 cm LA Vol (A4C): 23.4 ml 13.11 ml/m RA Volume:   23.00 ml 12.88 ml/m  MITRAL VALVE MV Area (PHT): 4.29 cm MV Decel Time: 177 msec MV E velocity: 58.90 cm/s MV A velocity: 116.00 cm/s MV E/A ratio:  0.51 Rozann Lesches MD Electronically signed by Rozann Lesches MD Signature Date/Time: 09/27/2019/4:38:41 PM    Final     Orson Eva, DO  [facility]  If 7PM-7AM, please contact night-coverage [email or web address] Password [email or web address] 10/06/2019, 5:17 PM   LOS: 0 days

## 2019-10-07 DIAGNOSIS — R4189 Other symptoms and signs involving cognitive functions and awareness: Secondary | ICD-10-CM

## 2019-10-07 DIAGNOSIS — C50911 Malignant neoplasm of unspecified site of right female breast: Secondary | ICD-10-CM | POA: Diagnosis not present

## 2019-10-07 DIAGNOSIS — I5022 Chronic systolic (congestive) heart failure: Secondary | ICD-10-CM | POA: Diagnosis not present

## 2019-10-07 DIAGNOSIS — G9341 Metabolic encephalopathy: Secondary | ICD-10-CM | POA: Diagnosis not present

## 2019-10-07 LAB — GLUCOSE, CAPILLARY
Glucose-Capillary: 96 mg/dL (ref 70–99)
Glucose-Capillary: 259 mg/dL — ABNORMAL HIGH (ref 70–99)

## 2019-10-07 LAB — BASIC METABOLIC PANEL
Anion gap: 9 (ref 5–15)
BUN: 22 mg/dL (ref 8–23)
CO2: 29 mmol/L (ref 22–32)
Calcium: 8.8 mg/dL — ABNORMAL LOW (ref 8.9–10.3)
Chloride: 102 mmol/L (ref 98–111)
Creatinine, Ser: 0.81 mg/dL (ref 0.44–1.00)
GFR calc Af Amer: >60 mL/min (ref >60)
GFR calc non Af Amer: >60 mL/min (ref >60)
Glucose, Bld: 112 mg/dL — ABNORMAL HIGH (ref 70–99)
Potassium: 4.4 mmol/L (ref 3.5–5.1)
Sodium: 140 mmol/L (ref 135–145)

## 2019-10-07 LAB — FOLATE: Folate: 9 ng/mL (ref >5.9)

## 2019-10-07 LAB — VITAMIN B12: Vitamin B-12: 677 pg/mL (ref 180–914)

## 2019-10-07 LAB — T4, FREE: Free T4: 0.8 ng/dL (ref 0.61–1.12)

## 2019-10-07 LAB — MAGNESIUM: Magnesium: 1.9 mg/dL (ref 1.7–2.4)

## 2019-10-07 LAB — TSH: TSH: 1.238 u[IU]/mL (ref 0.350–4.500)

## 2019-10-07 NOTE — Plan of Care (Signed)
  Problem: Acute Rehab PT Goals(only PT should resolve) Goal: Pt Will Go Supine/Side To Sit Outcome: Progressing Flowsheets (Taken 10/07/2019 1036) Pt will go Supine/Side to Sit: with modified independence Goal: Patient Will Transfer Sit To/From Stand Outcome: Progressing Flowsheets (Taken 10/07/2019 1036) Patient will transfer sit to/from stand:  with modified independence  with supervision Goal: Pt Will Transfer Bed To Chair/Chair To Bed Outcome: Progressing Flowsheets (Taken 10/07/2019 1036) Pt will Transfer Bed to Chair/Chair to Bed:  with modified independence  with supervision Goal: Pt Will Ambulate Outcome: Progressing Flowsheets (Taken 10/07/2019 1036) Pt will Ambulate:  75 feet  with supervision  with rolling walker   10:37 AM, 10/07/19 Lonell Grandchild, MPT Physical Therapist with [facility] [phone] office [phone] mobile phone

## 2019-10-07 NOTE — Discharge Summary (Signed)
Physician Discharge Summary  Amy [name] [id number] DOB: 04/20/1936 DOA: 10/05/2019  PCP: [name], MD  Admit date: 10/05/2019 Discharge date: 10/07/2019  Admitted From: Home Disposition:  Home   Recommendations for Outpatient Follow-up:  1. Follow up with PCP in 1-2 weeks 2. Please obtain BMP/CBC in one week 3.   Home Health:yes Equipment/Devices: HHPT, RN  Discharge Condition: Stable CODE STATUS: FULL Diet recommendation: Heart Healthy / Carb Modified   Brief/Interim Summary: 84 year old female with a history of cognitive impairment, diabetes mellitus type 2, systolic CHF, cryptogenic stroke, hyperlipidemia, depression presenting with generalized weakness, lethargy.  The patient was evaluated by her home health RN on the day of admission, and the patient was noted to have a blood pressure of 80/58.  She was noted to have some bruising on her right forehead as well as some lethargy.  As result, the patient was brought to emergency department by EMS.  The patient normally doses of oral medications, but her medications are usually prepared by her son.  Approximately 1 week prior to this admission, the patient was instructed to start metoprolol succinate 25 mg daily.  Unfortunately, the patient continued to take metoprolol succinate in addition to carvedilol which she was on previously.  In the emergency department, the patient was noted to have soft blood pressure with systolic in the 0000000.  She was afebrile with oxygen saturation 98% on room air.  BMP and CBC were unremarkable.  CT of the brain was negative.  CT of the cervical spine was negative for fracture or subluxation.  Coreg was stopped and metoprolol restarted and pt was monitored without any further hypotension.  Discharge Diagnoses:  Acute metabolic encephalopathy -Likely secondary to the patient's hypotension -Mental status is improved and at baseline per son -Serum [id number] -[id number] -Folic [id number] -UA neg for  pyuria -son requests formal neurocognitive testing-->amb referral to neuropsychology  Hypotension -Likely secondary to polypharmacy and confusion with carvedilol and metoprolol succinate -Discontinue carvedilol -Continue metoprolol succinate -BP improved  Chronic systolic and diastolic CHF -[id number] EF -Daily weights -Continue metoprolol succinate -clinically euvolemic  Hypomagnesemia -Repleted  Diabetes mellitus type 2, controlled -10/05/2019 hemoglobin A1c 5.8 -Discontinue CBGs -Juice discontinue NovoLog sliding scale -hold metformin--restart after d/c  Hyperlipidemia -Continue statin  Depression/anxiety -Continue trazodone and Effexor -Continue home dose alprazolam  Gait instability/deconditioning -PT evaluation-->HHPT  Discharge Instructions  Discharge Instructions    Ambulatory referral to Neurology   Complete by: As directed    Needs evaluation by neuropsychology for neurocognitive testing     Allergies as of 10/07/2019      Reactions   Cymbalta [duloxetine Hcl] Other (See Comments)   Hallucinated and couldn't think; got worse and worse the longer she took this.      Medication List    STOP taking these medications   carvedilol 3.125 MG tablet Commonly known as: COREG     TAKE these medications   ALPRAZolam 0.5 MG tablet Commonly known as: XANAX Take 1 tablet (0.5 mg total) by mouth at bedtime.   aspirin EC 81 MG tablet Take 81 mg by mouth every morning.   atorvastatin 40 MG tablet Commonly known as: LIPITOR Take 1 tablet (40 mg total) by mouth daily at 6 PM.   calcium carbonate 1500 (600 Ca) MG Tabs tablet Commonly known as: OSCAL Take 600 mg of elemental calcium by mouth daily with supper.   cholecalciferol 1000 units tablet Commonly known as: VITAMIN D Take 1 tablet (1,000 Units total) by mouth daily.  cyanocobalamin 1000 MCG/ML injection Commonly known as: (VITAMIN B-12) Daily for 4 days; then weekly for 1 month and then  monthly after that.   escitalopram 20 MG tablet Commonly known as: LEXAPRO Take 1 tablet (20 mg total) by mouth daily.   gabapentin 300 MG capsule Commonly known as: NEURONTIN Take 1 capsule (300 mg total) by mouth at bedtime.   letrozole 2.5 MG tablet Commonly known as: FEMARA TAKE 1 TABLET(2.5 MG) BY MOUTH DAILY What changed:   how much to take  how to take this  when to take this  additional instructions   losartan 25 MG tablet Commonly known as: COZAAR Take 0.5 tablets (12.5 mg total) by mouth at bedtime.   metFORMIN 500 MG tablet Commonly known as: GLUCOPHAGE Take 1 tablet (500 mg total) by mouth 2 (two) times daily with a meal.   metoprolol succinate 25 MG 24 hr tablet Commonly known as: Toprol XL Take 1 tablet (25 mg total) by mouth daily.   risperiDONE 1 MG tablet Commonly known as: RisperDAL Take 1 tablet (1 mg total) by mouth at bedtime.   traZODone 50 MG tablet Commonly known as: DESYREL Take 1 tablet (50 mg total) by mouth at bedtime.   venlafaxine XR 150 MG 24 hr capsule Commonly known as: EFFEXOR-XR Take 1 capsule (150 mg total) by mouth 2 (two) times daily.       Allergies  Allergen Reactions  . Cymbalta [Duloxetine Hcl] Other (See Comments)    Hallucinated and couldn't think; got worse and worse the longer she took this.    Consultations:  none   Procedures/Studies: CT Head Wo Contrast  Result Date: 10/05/2019 CLINICAL DATA:  Lethargy, headache, weakness EXAM: CT HEAD WITHOUT CONTRAST TECHNIQUE: Contiguous axial images were obtained from the base of the skull through the vertex without intravenous contrast. COMPARISON:  08/16/2019 FINDINGS: Brain: No evidence of acute infarction, hemorrhage, hydrocephalus, extra-axial collection or mass lesion/mass effect. Extensive low-density changes within the periventricular and subcortical white matter compatible with chronic microvascular ischemic change. Mild diffuse cerebral volume loss. Vascular:  Atherosclerotic calcifications involving the large vessels of the skull base. No unexpected hyperdense vessel. Skull: Normal. Negative for fracture or focal lesion. Sinuses/Orbits: No acute finding. Other: None. IMPRESSION: 1.  No acute intracranial findings. 2.  Chronic microvascular ischemic change and cerebral volume loss. Electronically Signed   By: Davina Poke D.O.   On: 10/05/2019 16:28   CT Cervical Spine Wo Contrast  Result Date: 10/05/2019 CLINICAL DATA:  Fall 4 days ago striking head. Polytrauma, critical, head/C-spine injury suspected EXAM: CT CERVICAL SPINE WITHOUT CONTRAST TECHNIQUE: Multidetector CT imaging of the cervical spine was performed without intravenous contrast. Multiplanar CT image reconstructions were also generated. COMPARISON:  No prior CT. FINDINGS: Alignment: Patient is tilted in the scanner with broad-based leftward curvature. Grade 1 anterolisthesis of C3 on C4, minimal retrolisthesis of C5 on C6, and anterolisthesis of C7 on T1 all appear degenerative and facet mediated. No evidence of traumatic subluxation. Skull base and vertebrae: No acute fracture. Vertebral body heights are maintained. The dens and skull base are intact. Degenerative fragmentation at C1-C2. Soft tissues and spinal canal: No prevertebral fluid or swelling. No visible canal hematoma. Disc levels: Disc space narrowing and endplate spurring from D34-534 through C6-C7, most prominent at C5-C6. Prominent multilevel facet hypertrophy. Upper chest: No acute findings allowing for motion. 3 mm right upper lobe pulmonary nodule, series 3, image 83, stable dating back to 11/13/2016 chest CT and considered benign. Other: None.  IMPRESSION: Degenerative change in the cervical spine without acute fracture or subluxation. Broad-based leftward curvature may be scoliosis or positional. Electronically Signed   By: Keith Rake M.D.   On: 10/05/2019 16:32   DG Chest Portable 1 View  Result Date: 10/05/2019 CLINICAL  DATA:  Altered mental status EXAM: PORTABLE CHEST 1 VIEW COMPARISON:  05/20/2019 FINDINGS: The heart size and mediastinal contours are within normal limits. Calcific aortic knob with tortuosity of the descending thoracic aorta. No focal airspace consolidation, pleural effusion, or pneumothorax. Partially visualized thoracolumbar spinal fusion hardware. IMPRESSION: No acute cardiopulmonary findings. Electronically Signed   By: Davina Poke D.O.   On: 10/05/2019 16:13   DG Hand Complete Left  Result Date: 10/05/2019 CLINICAL DATA:  Bruising, fall EXAM: LEFT HAND - COMPLETE 3+ VIEW COMPARISON:  None. FINDINGS: No fracture or malalignment. Advanced degenerative change at the first [facility] joint. Mild cartilaginous calcification. IMPRESSION: No acute osseous abnormality. Electronically Signed   By: Donavan Foil M.D.   On: 10/05/2019 16:13   ECHOCARDIOGRAM COMPLETE  Result Date: 09/27/2019    ECHOCARDIOGRAM REPORT   Patient Name:   Amy [name] Date of Exam: 09/27/2019 Medical Rec #:  [id number]         Height:       64.0 in Accession #:    [id number]        Weight:       161.2 lb Date of Birth:  [birth year]          BSA:          1.785 m Patient Age:    9 years          BP:           130/82 mmHg Patient Gender: F                 HR:           87 bpm. Exam Location:  [facility] Procedure: 2D Echo Indications:    Congestive Heart Failure 428.0 / I50.9  History:        Patient has prior history of Echocardiogram examinations, most                 recent 03/23/2019. CHF, Stroke; Risk Factors:Diabetes,                 Dyslipidemia and Former Smoker. S/P total knee replacement,                 right.  Sonographer:    Leavy Cella RDCS (AE) Referring Phys: [id number] PAULA V ROSS IMPRESSIONS  1. Left ventricular ejection fraction, by estimation, is 35 to 40%. The left ventricle has moderately decreased function. The left ventricle demonstrates global hypokinesis. Left ventricular diastolic parameters are consistent with  Grade I diastolic dysfunction (impaired relaxation).  2. Right ventricular systolic function is normal. The right ventricular size is normal. Tricuspid regurgitation signal is inadequate for assessing PA pressure.  3. The mitral valve is grossly normal with mild leaflet thickening. Mild mitral valve regurgitation.  4. The aortic valve is tricuspid. Aortic valve regurgitation is trivial.  5. The inferior vena cava is normal in size with greater than 50% respiratory variability, suggesting right atrial pressure of 3 mmHg.  6. Interatrial shunting not evident baed on limited images, although PFO previously reported by [facility] August 2020. FINDINGS  Left Ventricle: Left ventricular ejection fraction, by estimation, is 35 to 40%. The left ventricle has moderately decreased function. The left ventricle  demonstrates global hypokinesis. The left ventricular internal cavity size was normal in size. There is no left ventricular hypertrophy. Left ventricular diastolic parameters are consistent with Grade I diastolic dysfunction (impaired relaxation). Right Ventricle: The right ventricular size is normal. No increase in right ventricular wall thickness. Right ventricular systolic function is normal. Tricuspid regurgitation signal is inadequate for assessing PA pressure. Left Atrium: Left atrial size was normal in size. Right Atrium: Right atrial size was normal in size. Pericardium: There is no evidence of pericardial effusion. Presence of pericardial fat pad. Mitral Valve: The mitral valve is grossly normal. There is mild thickening of the mitral valve leaflet(s). Mild mitral valve regurgitation. Tricuspid Valve: The tricuspid valve is grossly normal. Tricuspid valve regurgitation is trivial. Aortic Valve: The aortic valve is tricuspid. Aortic valve regurgitation is trivial. Mild aortic valve annular calcification. Pulmonic Valve: The pulmonic valve was grossly normal. Pulmonic valve regurgitation is trivial. Aorta: The aortic  root is normal in size and structure. Venous: The inferior vena cava is normal in size with greater than 50% respiratory variability, suggesting right atrial pressure of 3 mmHg. IAS/Shunts: No atrial level shunt detected by color flow Doppler.   Diastology LV e' lateral:   4.68 cm/s LV E/e' lateral: 12.6 LV e' medial:    6.53 cm/s LV E/e' medial:  9.0  RIGHT VENTRICLE RV S prime:     10.70 cm/s TAPSE (M-mode): 1.1 cm LEFT ATRIUM           Index       RIGHT ATRIUM          Index LA Vol (A2C): 31.1 ml 17.42 ml/m RA Area:     9.95 cm LA Vol (A4C): 23.4 ml 13.11 ml/m RA Volume:   23.00 ml 12.88 ml/m  MITRAL VALVE MV Area (PHT): 4.29 cm MV Decel Time: 177 msec MV E velocity: 58.90 cm/s MV A velocity: 116.00 cm/s MV E/A ratio:  0.51 Rozann Lesches MD Electronically signed by Rozann Lesches MD Signature Date/Time: 09/27/2019/4:38:41 PM    Final          Discharge Exam: Vitals:   10/07/19 0515 10/07/19 0820  BP: (!) 109/46 (!) 143/74  Pulse: 71 73  Resp: 17   Temp: 97.8 F (36.6 C)   SpO2: 97%    Vitals:   10/06/19 1404 10/06/19 2025 10/07/19 0515 10/07/19 0820  BP: 116/68 130/65 (!) 109/46 (!) 143/74  Pulse: 72 79 71 73  Resp: 16 20 17    Temp: 97.7 F (36.5 C) 98.2 F (36.8 C) 97.8 F (36.6 C)   TempSrc: Oral Oral Oral   SpO2: 97% 94% 97%   Weight:      Height:        General: Pt is alert, awake, not in acute distress Cardiovascular: RRR, S1/S2 +, no rubs, no gallops Respiratory: CTA bilaterally, no wheezing, no rhonchi Abdominal: Soft, NT, ND, bowel sounds + Extremities: no edema, no cyanosis   The results of significant diagnostics from this hospitalization (including imaging, microbiology, ancillary and laboratory) are listed below for reference.    Significant Diagnostic Studies: CT Head Wo Contrast  Result Date: 10/05/2019 CLINICAL DATA:  Lethargy, headache, weakness EXAM: CT HEAD WITHOUT CONTRAST TECHNIQUE: Contiguous axial images were obtained from the base of the  skull through the vertex without intravenous contrast. COMPARISON:  08/16/2019 FINDINGS: Brain: No evidence of acute infarction, hemorrhage, hydrocephalus, extra-axial collection or mass lesion/mass effect. Extensive low-density changes within the periventricular and subcortical white matter compatible with chronic microvascular  ischemic change. Mild diffuse cerebral volume loss. Vascular: Atherosclerotic calcifications involving the large vessels of the skull base. No unexpected hyperdense vessel. Skull: Normal. Negative for fracture or focal lesion. Sinuses/Orbits: No acute finding. Other: None. IMPRESSION: 1.  No acute intracranial findings. 2.  Chronic microvascular ischemic change and cerebral volume loss. Electronically Signed   By: Davina Poke D.O.   On: 10/05/2019 16:28   CT Cervical Spine Wo Contrast  Result Date: 10/05/2019 CLINICAL DATA:  Fall 4 days ago striking head. Polytrauma, critical, head/C-spine injury suspected EXAM: CT CERVICAL SPINE WITHOUT CONTRAST TECHNIQUE: Multidetector CT imaging of the cervical spine was performed without intravenous contrast. Multiplanar CT image reconstructions were also generated. COMPARISON:  No prior CT. FINDINGS: Alignment: Patient is tilted in the scanner with broad-based leftward curvature. Grade 1 anterolisthesis of C3 on C4, minimal retrolisthesis of C5 on C6, and anterolisthesis of C7 on T1 all appear degenerative and facet mediated. No evidence of traumatic subluxation. Skull base and vertebrae: No acute fracture. Vertebral body heights are maintained. The dens and skull base are intact. Degenerative fragmentation at C1-C2. Soft tissues and spinal canal: No prevertebral fluid or swelling. No visible canal hematoma. Disc levels: Disc space narrowing and endplate spurring from D34-534 through C6-C7, most prominent at C5-C6. Prominent multilevel facet hypertrophy. Upper chest: No acute findings allowing for motion. 3 mm right upper lobe pulmonary nodule,  series 3, image 83, stable dating back to 11/13/2016 chest CT and considered benign. Other: None. IMPRESSION: Degenerative change in the cervical spine without acute fracture or subluxation. Broad-based leftward curvature may be scoliosis or positional. Electronically Signed   By: Keith Rake M.D.   On: 10/05/2019 16:32   DG Chest Portable 1 View  Result Date: 10/05/2019 CLINICAL DATA:  Altered mental status EXAM: PORTABLE CHEST 1 VIEW COMPARISON:  05/20/2019 FINDINGS: The heart size and mediastinal contours are within normal limits. Calcific aortic knob with tortuosity of the descending thoracic aorta. No focal airspace consolidation, pleural effusion, or pneumothorax. Partially visualized thoracolumbar spinal fusion hardware. IMPRESSION: No acute cardiopulmonary findings. Electronically Signed   By: Davina Poke D.O.   On: 10/05/2019 16:13   DG Hand Complete Left  Result Date: 10/05/2019 CLINICAL DATA:  Bruising, fall EXAM: LEFT HAND - COMPLETE 3+ VIEW COMPARISON:  None. FINDINGS: No fracture or malalignment. Advanced degenerative change at the first [facility] joint. Mild cartilaginous calcification. IMPRESSION: No acute osseous abnormality. Electronically Signed   By: Donavan Foil M.D.   On: 10/05/2019 16:13   ECHOCARDIOGRAM COMPLETE  Result Date: 09/27/2019    ECHOCARDIOGRAM REPORT   Patient Name:   Amy [name] Date of Exam: 09/27/2019 Medical Rec #:  [id number]         Height:       64.0 in Accession #:    [id number]        Weight:       161.2 lb Date of Birth:  17-Jan-1936          BSA:          1.785 m Patient Age:    84 years          BP:           130/82 mmHg Patient Gender: F                 HR:           87 bpm. Exam Location:  [facility] Procedure: 2D Echo Indications:    Congestive Heart Failure 428.0 /  I50.9  History:        Patient has prior history of Echocardiogram examinations, most                 recent 03/23/2019. CHF, Stroke; Risk Factors:Diabetes,                 Dyslipidemia and  Former Smoker. S/P total knee replacement,                 right.  Sonographer:    Leavy Cella RDCS (AE) Referring Phys: [id number] PAULA V ROSS IMPRESSIONS  1. Left ventricular ejection fraction, by estimation, is 35 to 40%. The left ventricle has moderately decreased function. The left ventricle demonstrates global hypokinesis. Left ventricular diastolic parameters are consistent with Grade I diastolic dysfunction (impaired relaxation).  2. Right ventricular systolic function is normal. The right ventricular size is normal. Tricuspid regurgitation signal is inadequate for assessing PA pressure.  3. The mitral valve is grossly normal with mild leaflet thickening. Mild mitral valve regurgitation.  4. The aortic valve is tricuspid. Aortic valve regurgitation is trivial.  5. The inferior vena cava is normal in size with greater than 50% respiratory variability, suggesting right atrial pressure of 3 mmHg.  6. Interatrial shunting not evident baed on limited images, although PFO previously reported by [facility] August 2020. FINDINGS  Left Ventricle: Left ventricular ejection fraction, by estimation, is 35 to 40%. The left ventricle has moderately decreased function. The left ventricle demonstrates global hypokinesis. The left ventricular internal cavity size was normal in size. There is no left ventricular hypertrophy. Left ventricular diastolic parameters are consistent with Grade I diastolic dysfunction (impaired relaxation). Right Ventricle: The right ventricular size is normal. No increase in right ventricular wall thickness. Right ventricular systolic function is normal. Tricuspid regurgitation signal is inadequate for assessing PA pressure. Left Atrium: Left atrial size was normal in size. Right Atrium: Right atrial size was normal in size. Pericardium: There is no evidence of pericardial effusion. Presence of pericardial fat pad. Mitral Valve: The mitral valve is grossly normal. There is mild thickening of the mitral  valve leaflet(s). Mild mitral valve regurgitation. Tricuspid Valve: The tricuspid valve is grossly normal. Tricuspid valve regurgitation is trivial. Aortic Valve: The aortic valve is tricuspid. Aortic valve regurgitation is trivial. Mild aortic valve annular calcification. Pulmonic Valve: The pulmonic valve was grossly normal. Pulmonic valve regurgitation is trivial. Aorta: The aortic root is normal in size and structure. Venous: The inferior vena cava is normal in size with greater than 50% respiratory variability, suggesting right atrial pressure of 3 mmHg. IAS/Shunts: No atrial level shunt detected by color flow Doppler.   Diastology LV e' lateral:   4.68 cm/s LV E/e' lateral: 12.6 LV e' medial:    6.53 cm/s LV E/e' medial:  9.0  RIGHT VENTRICLE RV S prime:     10.70 cm/s TAPSE (M-mode): 1.1 cm LEFT ATRIUM           Index       RIGHT ATRIUM          Index LA Vol (A2C): 31.1 ml 17.42 ml/m RA Area:     9.95 cm LA Vol (A4C): 23.4 ml 13.11 ml/m RA Volume:   23.00 ml 12.88 ml/m  MITRAL VALVE MV Area (PHT): 4.29 cm MV Decel Time: 177 msec MV E velocity: 58.90 cm/s MV A velocity: 116.00 cm/s MV E/A ratio:  0.51 Rozann Lesches MD Electronically signed by Rozann Lesches MD Signature Date/Time: 09/27/2019/4:38:41 PM  Final      Microbiology: Recent Results (from the past 240 hour(s))  SARS CORONAVIRUS 2 (Jillaine Waren 6-24 HRS) Nasopharyngeal Nasopharyngeal Swab     Status: None   Collection Time: 10/05/19  3:34 PM   Specimen: Nasopharyngeal Swab  Result Value Ref Range Status   SARS Coronavirus 2 NEGATIVE NEGATIVE Final    Comment: (NOTE) SARS-CoV-2 target nucleic acids are NOT DETECTED. The SARS-CoV-2 RNA is generally detectable in upper and lower respiratory specimens during the acute phase of infection. Negative results do not preclude SARS-CoV-2 infection, do not rule out co-infections with other pathogens, and should not be used as the sole basis for treatment or other patient management  decisions. Negative results must be combined with clinical observations, patient history, and epidemiological information. The expected result is Negative. Fact Sheet for Patients: SugarRoll.be Fact Sheet for Healthcare Providers: https://www.woods-mathews.com/ This test is not yet approved or cleared by the [place] FDA and  has been authorized for detection and/or diagnosis of SARS-CoV-2 by FDA under an Emergency Use Authorization (EUA). This EUA will remain  in effect (meaning this test can be used) for the duration of the COVID-19 declaration under Section 56 4(b)(1) of the Act, 21 U.S.C. section 360bbb-3(b)(1), unless the authorization is terminated or revoked sooner. Performed at [facility], [place] [address], [place], [place] [address]   Urine culture     Status: Abnormal (Preliminary result)   Collection Time: 10/05/19  5:59 PM   Specimen: Urine, Clean Catch  Result Value Ref Range Status   Specimen Description   Final    URINE, CLEAN CATCH Performed at [facility], [address], [place], [place] [address]    Special Requests   Final    NONE Performed at [facility], [address] [place], [place] [address]    Culture (A)  Final    >=100,000 COLONIES/mL ESCHERICHIA COLI SUSCEPTIBILITIES TO FOLLOW Performed at [facility], [place] [address] [place],  [address]    Report Status PENDING  Incomplete     Labs: Basic Metabolic Panel: Recent Labs  Lab 10/05/19 1526 10/05/19 1526 10/06/19 0445 10/07/19 0625  NA 138  --  140 140  K 4.0   < > 4.5 4.4  CL 101  --  102 102  CO2 28  --  29 29  GLUCOSE 94  --  126* 112*  BUN 27*  --  23 22  CREATININE 1.06*  --  0.85 0.81  CALCIUM 8.8*  --  8.9 8.8*  MG 1.5*  --  1.5* 1.9   < > = values in this interval not displayed.   Liver Function Tests: Recent Labs  Lab 10/05/19 1526 10/06/19 0445  AST 16 15  ALT 13 12  ALKPHOS 54 49  BILITOT 0.7 0.5   PROT 6.1* 5.8*  ALBUMIN 3.7 3.5   No results for input(s): LIPASE, AMYLASE in the last 168 hours. No results for input(s): AMMONIA in the last 168 hours. CBC: Recent Labs  Lab 10/05/19 1526 10/06/19 0445  WBC 7.9 7.9  NEUTROABS 5.7  --   HGB 12.7 12.6  HCT 39.9 39.4  MCV 96.1 95.6  PLT 214 208   Cardiac Enzymes: No results for input(s): CKTOTAL, CKMB, CKMBINDEX, TROPONINI in the last 168 hours. BNP: Invalid input(s): POCBNP CBG: Recent Labs  Lab 10/06/19 0744 10/06/19 1110 10/06/19 1618 10/06/19 2022 10/07/19 0734  GLUCAP 115* 146* 76 144* 96    Time coordinating discharge:  36  minutes  Signed:  Orson Eva, DO [facility] Pager: [phone] 10/07/2019, 11:24 AM

## 2019-10-07 NOTE — Evaluation (Addendum)
Physical Therapy Evaluation Patient Details Name: Amy [name] MRN: [id number] DOB: Oct 31, 1935 Today's Date: 10/07/2019   History of Present Illness  Amy [name]  is a 84 y.o. female, with history of arthritis, depression, diabetes mellitus type 2, hyperlipidemia, breast cancer, chronic systolic CHF, CVA, hyperlipidemia was brought to the hospital for evaluation of low blood pressure at home noticed by advanced home health nurse today.  Patient has been feeling weak and lethargic per EMS by home health staff.  Blood pressure reading at home was 80/58.  There was no reported history of syncope.  Patient denies any chest pain or shortness of breath.  As per patient's son, who spoke to the ED physician.  Patient was supposed to be taken off Coreg and started on metoprolol.  And it seems she was taking both Coreg and metoprolol.  Coreg has been discontinued at this time.    Clinical Impression  Patient functioning near baseline for functional mobility and gait, poor tolerance for lying flat in bed due to back discomfort,  Demonstrates good return for sitting up at bedside and completing sit to stands without AD, ambulated with slow slightly labored cadence without loss of balance, limited secondary to c/o fatigue.  Patient tolerated sitting up in chair after therapy - nursing staff notified.  Patient will benefit from continued physical therapy in hospital and recommended venue below to increase strength, balance, endurance for safe ADLs and gait.      Follow Up Recommendations Home health PT;Supervision for mobility/OOB;Supervision - Intermittent    Equipment Recommendations  None recommended by PT    Recommendations for Other Services       Precautions / Restrictions Precautions Precautions: Fall Restrictions Weight Bearing Restrictions: No      Mobility  Bed Mobility Overal bed mobility: Modified Independent             General bed mobility comments: increased  time  Transfers Overall transfer level: Needs assistance Equipment used: Rolling walker (2 wheeled) Transfers: Sit to/from [employer] Transfers Sit to Stand: Supervision Stand pivot transfers: Supervision;Min guard       General transfer comment: increased time, labored movement  Ambulation/Gait Ambulation/Gait assistance: Supervision;Min guard Gait Distance (Feet): 45 Feet Assistive device: Rolling walker (2 wheeled) Gait Pattern/deviations: Decreased step length - right;Decreased stride length;Decreased step length - left Gait velocity: decreased   General Gait Details: slow slightly labored cadence without loss of balance, limited secondary to c/o fatigue  Stairs            Wheelchair Mobility    Modified Rankin (Stroke Patients Only)       Balance Overall balance assessment: Needs assistance Sitting-balance support: Feet supported;No upper extremity supported Sitting balance-Leahy Scale: Good Sitting balance - Comments: seated at EOB   Standing balance support: During functional activity;Bilateral upper extremity supported Standing balance-Leahy Scale: Fair Standing balance comment: using RW                             Pertinent Vitals/Pain Pain Assessment: 0-10 Pain Score: 2  Pain Location: feet Pain Descriptors / Indicators: Sore;Discomfort Pain Intervention(s): Limited activity within patient's tolerance;Monitored during session    Home Living Family/patient expects to be discharged to:: Private residence   Available Help at Discharge: Family;Available PRN/intermittently Type of Home: House Home Access: Stairs to enter Entrance Stairs-Rails: None Entrance Stairs-Number of Steps: 2 Home Layout: Two level;Able to live on main level with bedroom/bathroom Home Equipment: Gilford Rile - 2  wheels;Cane - single point;Bedside commode;Tub bench      Prior Function Level of Independence: Needs assistance   Gait / Transfers Assistance  Needed: household ambulator with Rollator  ADL's / Homemaking Assistance Needed: assisted by family for community ADLs and some household        Hand Dominance   Dominant Hand: Right    Extremity/Trunk Assessment   Upper Extremity Assessment Upper Extremity Assessment: Overall WFL for tasks assessed    Lower Extremity Assessment Lower Extremity Assessment: Generalized weakness    Cervical / Trunk Assessment Cervical / Trunk Assessment: Kyphotic  Communication   Communication: No difficulties  Cognition Arousal/Alertness: Awake/alert Behavior During Therapy: WFL for tasks assessed/performed Overall Cognitive Status: Within Functional Limits for tasks assessed                                        General Comments      Exercises     Assessment/Plan    PT Assessment Patient needs continued PT services  PT Problem List Decreased strength;Decreased activity tolerance;Decreased balance;Decreased mobility       PT Treatment Interventions Gait training;Balance training;Stair training;Functional mobility training;Therapeutic activities;Therapeutic exercise;Patient/family education    PT Goals (Current goals can be found in the Care Plan section)  Acute Rehab PT Goals Patient Stated Goal: return home with family to assist PT Goal Formulation: With patient Time For Goal Achievement: 10/14/19 Potential to Achieve Goals: Good    Frequency Min 3X/week   Barriers to discharge        Co-evaluation               AM-PAC PT "6 Clicks" Mobility  Outcome Measure Help needed turning from your back to your side while in a flat bed without using bedrails?: None Help needed moving from lying on your back to sitting on the side of a flat bed without using bedrails?: None Help needed moving to and from a bed to a chair (including a wheelchair)?: A Little Help needed standing up from a chair using your arms (e.g., wheelchair or bedside chair)?: A  Little Help needed to walk in hospital room?: A Little Help needed climbing 3-5 steps with a railing? : A Lot 6 Click Score: 19    End of Session   Activity Tolerance: Patient tolerated treatment well;Patient limited by fatigue Patient left: in chair;with call bell/phone within reach;with chair alarm set Nurse Communication: Mobility status PT Visit Diagnosis: Unsteadiness on feet (R26.81);Other abnormalities of gait and mobility (R26.89);Muscle weakness (generalized) (M62.81)    Time: [id number] PT Time Calculation (min) (ACUTE ONLY): 32 min   Charges:   PT Evaluation $PT Eval Moderate Complexity: 1 Mod PT Treatments $Therapeutic Activity: 23-37 mins        10:41 AM, 10/07/19 Lonell Grandchild, MPT Physical Therapist with [facility] [phone] office [phone] mobile phone

## 2019-10-07 NOTE — Progress Notes (Signed)
Discharge instructions given to son by Ruel Favors, RN. Iv removed with cath intact. Left unit via unit. No acute distress.

## 2019-10-07 NOTE — TOC Transition Note (Signed)
Transition of Care [facility]) - CM/SW Discharge Note   Patient Details  Name: Amy [name] MRN: [id number] Date of Birth: 05-Jun-1936  Transition of Care [facility]) CM/SW Contact:  Boneta Lucks, RN Phone Number: 10/07/2019, 11:43 AM   Clinical Narrative:   Patient admitted with UTI, medically ready to discharge home. Patient is active with [facility], resumption orders has been placed. Romualdo Bolk updated.    Final next level of care: [place] Barriers to Discharge: Barriers Resolved   Patient Goals and CMS Choice Patient states their goals for this hospitalization and ongoing recovery are:: to go home. CMS Medicare.gov Compare Post Acute Care list provided to:: Patient Choice offered to / list presented to : Patient  Discharge Placement                Patient to be transferred to facility by: family   Patient and family notified of of transfer: 10/07/19  Discharge Plan and Services            Representative spoke with at [place]: Romualdo Bolk   Readmission Risk Interventions Readmission Risk Prevention Plan 08/19/2019  Transportation Screening Complete  PCP or Specialist Appt within 3-5 Days Not Complete  HRI or Home Care Consult Complete  Social Work Consult for [place] Planning/Counseling Complete  Palliative Care Screening Not Complete  Medication Review Press photographer) Complete  Some recent data might be hidden

## 2019-10-08 LAB — URINE CULTURE: Culture: >=100000 — AB

## 2019-10-13 ENCOUNTER — Telehealth: Payer: Self-pay | Admitting: Internal Medicine

## 2019-10-13 NOTE — Telephone Encounter (Signed)
Patients son, Jenny Reichmann, called in to cancel patients appt with Dr. [name] on 10/18/19. He states this was to be a f/u to a stress test but his mother cancelled the stress test for some reason. Can I reinstate the order for the stress test to get her rescheduled or how should he go about scheduling her appt with Dr. [name]?

## 2019-10-14 NOTE — Telephone Encounter (Signed)
Patient was ordered lexiscan after echo results. She cancelled this appointment.  Please see telephone encounter 09/29/19 for further details.

## 2019-10-14 NOTE — Telephone Encounter (Signed)
Pt has been rescheduled for loop implant.

## 2019-10-18 ENCOUNTER — Ambulatory Visit: Payer: Medicare HMO | Admitting: Internal Medicine

## 2019-10-18 NOTE — Addendum Note (Signed)
Addended by: Rodman Key on: 10/18/2019 04:59 PM   Modules accepted: Orders

## 2019-10-18 NOTE — Telephone Encounter (Signed)
Go ahead and sched lexiscan myovue to r/o large area of ischemia

## 2019-10-18 NOTE — Telephone Encounter (Signed)
lexiscan has been reordered. Note to call son to schedule.

## 2019-10-21 ENCOUNTER — Other Ambulatory Visit: Payer: Self-pay

## 2019-10-21 ENCOUNTER — Ambulatory Visit: Payer: Medicare HMO | Admitting: Internal Medicine

## 2019-10-21 ENCOUNTER — Encounter: Payer: Self-pay | Admitting: Internal Medicine

## 2019-10-21 VITALS — BP 140/70 | HR 81 | Wt 158.9 lb

## 2019-10-21 DIAGNOSIS — I5022 Chronic systolic (congestive) heart failure: Secondary | ICD-10-CM | POA: Diagnosis not present

## 2019-10-21 DIAGNOSIS — I639 Cerebral infarction, unspecified: Secondary | ICD-10-CM | POA: Insufficient documentation

## 2019-10-21 DIAGNOSIS — R002 Palpitations: Secondary | ICD-10-CM

## 2019-10-21 HISTORY — PX: OTHER SURGICAL HISTORY: SHX169

## 2019-10-21 NOTE — Patient Instructions (Addendum)
Medication Instructions:  Your physician recommends that you continue on your current medications as directed. Please refer to the Current Medication list given to you today.  Labwork: None ordered.  Testing/Procedures: None ordered.  Follow-Up:  You will have a wound check with the device clinic in 7-10 days.  November 02, 2019 at 10:30 am at the [facility] office   Your physician wants you to follow-up in: as needed with Dr. [name].   Implantable Loop Recorder Placement, Care After This sheet gives you information about how to care for yourself after your procedure. Your health care provider may also give you more specific instructions. If you have problems or questions, contact your health care provider. What can I expect after the procedure? After the procedure, it is common to have:  Soreness or discomfort near the incision.  Some swelling or bruising near the incision. Follow these instructions at home: Incision care   Follow instructions from your health care provider about how to take care of your incision. Make sure you: ? Leave your outer dressing on for 24 hours.  After 24 hours you can remove that and shower. ? Leave adhesive strips in place. These skin closures may need to stay in place for 2 weeks or longer. If adhesive strip edges start to loosen and curl up, you may trim the loose edges. Do not remove adhesive strips completely unless your health care provider tells you to do that.  Check your incision area every day for signs of infection. Check for: ? Redness, swelling, or pain. ? Fluid or blood. ? Warmth. ? Pus or a bad smell. Do not take baths, swim, or use a hot tub until your incision is completely healed.  Activity  Return to your normal activities.  General instructions  Follow instructions from your health care provider about how to manage your implantable loop recorder and transmit the information. Learn how to activate a recording if this is  necessary for your type of device.  Do not go through a metal detection gate, and do not let someone hold a metal detector over your chest. Show your ID card.  Do not have an MRI unless you check with your health care provider first.  Take over-the-counter and prescription medicines only as told by your health care provider.  Keep all follow-up visits as told by your health care provider. This is important. Contact a health care provider if:  You have redness, swelling, or pain around your incision.  You have a fever.  You have pain that is not relieved by your pain medicine.  You have triggered your device because of fainting (syncope) or because of a heartbeat that feels like it is racing, slow, fluttering, or skipping (palpitations). Get help right away if you have:  Chest pain.  Difficulty breathing. Summary  After the procedure, it is common to have soreness or discomfort near the incision.  Change your dressing as told by your health care provider.  Follow instructions from your health care provider about how to manage your implantable loop recorder and transmit the information.  Keep all follow-up visits as told by your health care provider. This is important. This information is not intended to replace advice given to you by your health care provider. Make sure you discuss any questions you have with your health care provider. Document Released: 06/26/2015 Document Revised: 08/30/2017 Document Reviewed: 08/30/2017 Elsevier Patient Education  2020 [employer].

## 2019-10-21 NOTE — Progress Notes (Signed)
PCP: [name], MD Primary Cardiologist: Dr [name] Primary EP: Dr [name]  Amy [name] is a 84 y.o. female who presents today for routine electrophysiology followup.  Since last being seen in our clinic, the patient reports doing reasonably well.  She has some cognitive delay.  Does not remember my phone call in February.  Continues to recover from her stroke and has a caregiver with her today who assists with history. Today, she denies symptoms of palpitations, chest pain, shortness of breath,  lower extremity edema, dizziness, presyncope, or syncope.  The patient is otherwise without complaint today.   Past Medical History:  Diagnosis Date  . Anxiety   . Arthritis   . Back pain   . Colon polyps   . Depression   . Diabetes mellitus, type II ([place])    diet controlled  . Hypercholesterolemia   . Invasive ductal carcinoma of breast, female, right ([place])   . Osteopenia 11/26/2016  . Skin-picking disorder    has 3 open wounds on right wrist that are being treated. About a quarter in size.  . Stroke [facility])    Past Surgical History:  Procedure Laterality Date  . APPENDECTOMY    . BACK SURGERY    . BREAST BIOPSY Right 10/09/2016   Procedure: BREAST BIOPSY WITH NEEDLE LOCALIZATION;  Surgeon: [name], MD;  Location: [facility] ORS;  Service: General;  Laterality: Right;  . BREAST SURGERY Right   . BUBBLE STUDY  03/23/2019   Procedure: BUBBLE STUDY;  Surgeon: [name], MD;  Location: [facility] ENDOSCOPY;  Service: Cardiovascular;;  . BUNIONECTOMY Bilateral   . FACIAL COSMETIC SURGERY     drooping eyelid and brow  . HERNIA REPAIR Right    Inguinal x2  . KNEE ARTHROSCOPY Right   . SHOULDER SURGERY Right    rotator cuff repair and removal of bone spur  . TEE WITHOUT CARDIOVERSION N/A 03/23/2019   Procedure: TRANSESOPHAGEAL ECHOCARDIOGRAM (TEE);  Surgeon: [name], MD;  Location[facility] ENDOSCOPY;  Service: Cardiovascular;  Laterality: N/A;  . TEMPOROMANDIBULAR JOINT SURGERY Right    . TOTAL KNEE ARTHROPLASTY Right 05/05/2018   Procedure: TOTAL KNEE ARTHROPLASTY;  Surgeon: [name], MD;  Location: [facility] ORS;  Service: Orthopedics;  Laterality: Right;    ROS- all systems are reviewed and negatives except as per HPI above  Current Outpatient Medications  Medication Sig Dispense Refill  . ALPRAZolam (XANAX) 0.5 MG tablet Take 1 tablet (0.5 mg total) by mouth at bedtime. 30 tablet 2  . aspirin EC 81 MG tablet Take 81 mg by mouth every morning.    Marland Kitchen atorvastatin (LIPITOR) 40 MG tablet Take 1 tablet (40 mg total) by mouth daily at 6 PM. 90 tablet 2  . calcium carbonate (OSCAL) 1500 (600 Ca) MG TABS tablet Take 600 mg of elemental calcium by mouth daily with supper.     . cholecalciferol (VITAMIN D) 1000 units tablet Take 1 tablet (1,000 Units total) by mouth daily. 30 tablet 11  . cyanocobalamin (,VITAMIN B-12,) 1000 MCG/ML injection Daily for 4 days; then weekly for 1 month and then monthly after that. 12 mL 0  . escitalopram (LEXAPRO) 20 MG tablet Take 1 tablet (20 mg total) by mouth daily. 30 tablet 2  . gabapentin (NEURONTIN) 300 MG capsule Take 1 capsule (300 mg total) by mouth at bedtime. 30 capsule 2  . letrozole (FEMARA) 2.5 MG tablet TAKE 1 TABLET(2.5 MG) BY MOUTH DAILY 90 tablet 3  . metFORMIN (GLUCOPHAGE) 500  MG tablet Take 1 tablet (500 mg total) by mouth 2 (two) times daily with a meal. 60 tablet 0  . metoprolol succinate (TOPROL XL) 25 MG 24 hr tablet Take 1 tablet (25 mg total) by mouth daily. 30 tablet 11  . risperiDONE (RISPERDAL) 1 MG tablet Take 1 tablet (1 mg total) by mouth at bedtime. 30 tablet 2  . traZODone (DESYREL) 50 MG tablet Take 1 tablet (50 mg total) by mouth at bedtime. 30 tablet 2  . venlafaxine XR (EFFEXOR-XR) 150 MG 24 hr capsule Take 1 capsule (150 mg total) by mouth 2 (two) times daily. 60 capsule 2  . losartan (COZAAR) 25 MG tablet Take 0.5 tablets (12.5 mg total) by mouth at bedtime. 45 tablet 3   No current facility-administered  medications for this visit.    Physical Exam: Vitals:   10/21/19 1024  BP: 140/70  Pulse: 81  SpO2: 94%  Weight: 158 lb 14.4 oz (72.1 kg)    GEN- The patient is elderly appearing, alert and oriented x 3 today.  In a wheelchair today Head- normocephalic, atraumatic Eyes-  Sclera clear, conjunctiva pink Ears- hearing intact Oropharynx- clear Lungs-  normal work of breathing Heart- Regular rate and rhythm  GI- soft, NT, ND, + BS Extremities- no clubbing, cyanosis, or edema  Wt Readings from Last 3 Encounters:  10/21/19 158 lb 14.4 oz (72.1 kg)  10/05/19 160 lb 15 oz (73 kg)  09/20/19 161 lb 3.2 oz (73.1 kg)    EKG tracing ordered today is personally reviewed and shows sinus, atrial enlargement, diffuse TWI  Assessment and Plan:  1. Cryptogenic stroke We discussed at length today pros and cons of long term monitoring.  She has previously worn an event monitor with no afib observed.  I have spoken at length with her son by phone today.  He agrees that ILR is appropriate for her.  I would therefore advise implantation of an implantable loop recorder for long term arrhythmia monitoring.  Risks and benefits to ILR were discussed at length with the patient and her son today, including but not limited to risks of bleeding and infection.  Extensive device education was performed.  Remote monitoring was also discussed at length today with patient and care giver.  The patient understands and wishes to proceed.  We will proceed at this time with ILR implantation.  2. Chronic systolic dysfunction EF has improved but not normalized She will continue to follow with Dr Felicita Gage MD, [facility] 10/21/2019 11:04 AM      DESCRIPTION OF PROCEDURE:  Informed written consent was obtained.  The patient required no sedation for the procedure today.  The patients left chest was prepped and draped. Mapping over the patient's chest was performed to identify the appropriate ILR site.  This area  was found to be the left parasternal region over the 3rd-4th intercostal space.  The skin overlying this region was infiltrated with lidocaine for local analgesia.  A 0.5-cm incision was made at the implant site.  A subcutaneous ILR pocket was fashioned using a combination of sharp and blunt dissection.  A Medtronic Reveal Linq model [id number] [id number])  implantable loop recorder was then placed into the pocket R waves were very prominent and measured > 0.2 mV. EBL<1 ml.  Steri- Strips and a sterile dressing were then applied.  There were no early apparent complications.     CONCLUSIONS:   1. Successful implantation of a Medtronic Reveal LINQ implantable loop recorder for  cryptogenic stroke indication  2. No early apparent complications.   Thompson Grayer MD, [facility] 10/21/2019 11:04 AM

## 2019-10-26 ENCOUNTER — Encounter: Payer: Self-pay | Admitting: Counselor

## 2019-10-27 ENCOUNTER — Telehealth: Payer: Self-pay | Admitting: *Deleted

## 2019-10-27 NOTE — Telephone Encounter (Signed)
   [facility] Pre-operative Risk Assessment    Request for surgical clearance:  1. What type of surgery is being performed? REVISION POSTERIOR LATERAL FUSION L3-S1 WITH S2/ALAR ILIAC FIXATION   2. When is this surgery scheduled? TBD   3. What type of clearance is required (medical clearance vs. Pharmacy clearance to hold med vs. Both)? MEDICAL  4. Are there any medications that need to be held prior to surgery and how long? ASA   5. Practice name and name of physician performing surgery? [facility]; DR. Mallie Mussel POOL   6. What is your office phone number [phone]    7.   What is your office fax number [phone] ATTN: VANESSA X [phone]  1.   Anesthesia type (None, local, MAC, general) ? GENERAL   Amy [name] 10/27/2019, 9:30 AM  _________________________________________________________________   (provider comments below)

## 2019-10-27 NOTE — Telephone Encounter (Signed)
   Primary Cardiologist: Dr. [name]  Chart reviewed as part of pre-operative protocol coverage. Given past medical history and time since last visit, based on ACC/AHA guidelines, Amy [name] would be at acceptable risk for the planned procedure without further cardiovascular testing.   She was last seen by Dr. [name] on 10/21/2019. She was stable. She may stop ASA for procedure but start back ASAP after completed.   I will route this recommendation to the requesting party via Epic fax function and remove from pre-op pool.  Please call with questions.  Phill Myron. [place] Pugh, ANP, AACC  10/27/2019, 9:48 AM

## 2019-11-02 ENCOUNTER — Other Ambulatory Visit: Payer: Self-pay

## 2019-11-02 ENCOUNTER — Telehealth (INDEPENDENT_AMBULATORY_CARE_PROVIDER_SITE_OTHER): Payer: Medicare HMO | Admitting: *Deleted

## 2019-11-02 DIAGNOSIS — Z95818 Presence of other cardiac implants and grafts: Secondary | ICD-10-CM

## 2019-11-02 DIAGNOSIS — I639 Cerebral infarction, unspecified: Secondary | ICD-10-CM

## 2019-11-02 NOTE — Progress Notes (Signed)
Patient verbally consented to ILR wound check via virtual visit due to COVID-19.  ILR wound check via telephone visit. Spoke with patient and caregiver. Patient's son to send photo of incision later today. Home monitor updating nightly. No episodes detected since implant. Patient and caregiver deny any drainage, redness, swelling, or fever/chills; they report incision is well healed. Provided education about wound care, home monitor, and monthly summary reports. ROV with Dr. [name] PRN. Patient and caregiver deny any additional questions or concerns at this time.  Incision photo received. Incision appears well healed.

## 2019-11-08 ENCOUNTER — Telehealth (HOSPITAL_COMMUNITY): Payer: Self-pay

## 2019-11-08 ENCOUNTER — Telehealth: Payer: Self-pay

## 2019-11-08 NOTE — Telephone Encounter (Signed)
Detailed instructions left on the patient's answering machine. Asked to call back with any questions. S.Sheretha Shadd EMTP 

## 2019-11-08 NOTE — Telephone Encounter (Signed)
Amy [name] from Dr. Jacqulynn Cadet' dental office called wanting to find out if Amy [name] needs medication for dental procedures and cleanings.Patient had TKA(R Knee) in 2019. I explained to her that our office didn't have a doctor available to speak with. She is asking if our office would fax this information to [phone] Attn: Amy [name]. They would just do a consultation today.

## 2019-11-09 ENCOUNTER — Ambulatory Visit (HOSPITAL_COMMUNITY): Payer: Medicare HMO | Attending: Cardiovascular Disease

## 2019-11-09 ENCOUNTER — Other Ambulatory Visit: Payer: Self-pay

## 2019-11-09 DIAGNOSIS — I5022 Chronic systolic (congestive) heart failure: Secondary | ICD-10-CM | POA: Insufficient documentation

## 2019-11-09 LAB — MYOCARDIAL PERFUSION IMAGING
LV dias vol: 86 mL (ref 46–106)
LV sys vol: 45 mL
Peak HR: 113 {beats}/min
Rest HR: 81 {beats}/min
SDS: 1
SRS: 1
SSS: 2
TID: 1.09

## 2019-11-09 MED ORDER — TECHNETIUM TC 99M TETROFOSMIN IV KIT
30.3000 | PACK | Freq: Once | INTRAVENOUS | Status: AC | PRN
  Administered 2019-11-09: 30.3 via INTRAVENOUS
  Filled 2019-11-09: qty 31

## 2019-11-09 MED ORDER — CEPHALEXIN 500 MG PO CAPS: 2000.0000 mg | ORAL_CAPSULE | Freq: Once | ORAL | 2 refills | Status: AC

## 2019-11-09 MED ORDER — REGADENOSON 0.4 MG/5ML IV SOLN
0.4000 mg | Freq: Once | INTRAVENOUS | Status: AC
  Administered 2019-11-09: 0.4 mg via INTRAVENOUS

## 2019-11-09 MED ORDER — TECHNETIUM TC 99M TETROFOSMIN IV KIT
10.4000 | PACK | Freq: Once | INTRAVENOUS | Status: AC | PRN
  Administered 2019-11-09: 10.4 via INTRAVENOUS
  Filled 2019-11-09: qty 11

## 2019-11-10 ENCOUNTER — Telehealth: Payer: Self-pay | Admitting: Internal Medicine

## 2019-11-10 NOTE — Telephone Encounter (Signed)
We are recommending the COVID-19 vaccine to all of our patients. Cardiac medications (including blood thinners) should not deter anyone from being vaccinated and there is no need to hold any of those medications prior to vaccine administration.     Currently, there is a hotline to call (active 08/06/19) to schedule vaccination appointments as no walk-ins will be accepted.   Number: [phone].    If an appointment is not available please go to [email or web address] to sign up for notification when additional vaccine appointments are available.   If you have further questions or concerns about the vaccine process, please visit [email or web address] or contact your primary care physician.   

## 2019-11-22 ENCOUNTER — Encounter: Payer: Self-pay | Admitting: Counselor

## 2019-11-22 ENCOUNTER — Ambulatory Visit (INDEPENDENT_AMBULATORY_CARE_PROVIDER_SITE_OTHER): Payer: Medicare HMO | Admitting: Counselor

## 2019-11-22 ENCOUNTER — Other Ambulatory Visit (HOSPITAL_COMMUNITY): Payer: Self-pay | Admitting: Psychiatry

## 2019-11-22 ENCOUNTER — Telehealth: Payer: Self-pay | Admitting: Orthopedic Surgery

## 2019-11-22 ENCOUNTER — Other Ambulatory Visit: Payer: Self-pay

## 2019-11-22 ENCOUNTER — Ambulatory Visit: Payer: Medicare HMO | Admitting: Psychology

## 2019-11-22 DIAGNOSIS — R54 Age-related physical debility: Secondary | ICD-10-CM

## 2019-11-22 DIAGNOSIS — F09 Unspecified mental disorder due to known physiological condition: Secondary | ICD-10-CM

## 2019-11-22 DIAGNOSIS — I679 Cerebrovascular disease, unspecified: Secondary | ICD-10-CM

## 2019-11-22 MED ORDER — TRAZODONE HCL 50 MG PO TABS: 50.0000 mg | ORAL_TABLET | Freq: Every day | ORAL | 2 refills | Status: DC

## 2019-11-22 MED ORDER — CEPHALEXIN 500 MG PO CAPS: 2000.0000 mg | ORAL_CAPSULE | Freq: Once | ORAL | 2 refills | Status: AC

## 2019-11-22 NOTE — Telephone Encounter (Signed)
Sent in Keflex for her to take one hour prior to dental work  Can you fax them to advise? I have 2 casts waiting for me in clinic and can not stop to send fax.  Thanks

## 2019-11-22 NOTE — Telephone Encounter (Signed)
Faxed note with this information as requested.

## 2019-11-22 NOTE — Progress Notes (Signed)
[facility]  Patient Name: Amy [name] MRN: [id number] Date of Birth: 03-25-1936 Age: 84 y.o. Education: 14 years  Referral Circumstances and Background Information  [name] is a 83 y.o., right-hand dominant, divorced with a history of memory and thinking problems over the past several years as per her son. He noticed some acceleration of her decline after a stroke around 03/20/2019 (felt to be embolic in origin affecting the left precentral gyrus and left superior parietal lobule) but there were problems before then. She has had multiple recent hospitalizations for various issues, the most recent of which was October 15, 2019, when she was dehydrated, had soft BP related to confusion about medication. She also had a hospitalization on 08/16/19 for confusion. She presents for neuropsychological evaluation in the service of diagnostic clarification.  On interview, the patient's son has noticed problems for several years with some acceleration over the past year. She confuses him, sometimes with her brother and her father, who have both been dead for some time. She was asking him about his birthday and forgot it happened. There was an episode a while ago after her February neurology appointment where she was confused about her house and refused to get out of the car because she thought she lived at her son's house. She also has memory difficulties and will repeat herself and say the same thing over and over again. Some of these incidents have been when she is acutely ill, for instance she had a UTI when she was in the hospital recently in February, but some of them seem more enduring. She has problems keeping track of the year and the month. With respect to mood, the patient said that she doesn't like staying by herself, but was vague. She has been seeing Dr. [name] (Psychiatrist) for many years and has a history of MDD with anxious distress. They have home  health coming in since March, 2021, 4 days per week (4-6 hours per day). Her son said that she can get agitated, they were previously helping her with everything themselves but they have transitioned to paid help because it was too much and she hasn't dealt well with the change. She stated that her energy is low. Her main activity is watching TV. She stated that she sleeps well at night. She stated that her weight is stable for her.   The patient is essentially totally dependent on her family for all instrumental activities of daily living for some time. Her son took over the bills after a back surgery in 2019, because she was having a hard time managing them herself. She was driving occasionally before her stroke in 2020 and she has not been driving since then. Her son stated that she stopped cooking a few years ago, and her family has been helping out with meals since then. She can heat food in the microwave but that is about it. Her aids help her with showering, toileting, and hygiene. Her son said that she was neglecting her hygiene previously after November, 2020. Her son has been making all her medical appointments for some time, at least since her back surgery in 2019.   Past Medical History and Review of Relevant Studies   Patient Active Problem List   Diagnosis Date Noted  . Cryptogenic stroke ([place]) 10/21/2019  . Palpitations 10/21/2019  . Cognitive impairment   . UTI (urinary tract infection) 10/05/2019  . Dehydration   . Major depressive disorder, recurrent episode, mild with  anxious distress ([place])   . Acute lower UTI   . Deficiency of vitamin B12   . Physical deconditioning   . Acute metabolic encephalopathy 0000000  . Acute encephalopathy 08/16/2019  . Mild renal insufficiency 08/16/2019  . Chronic systolic CHF (congestive heart failure) ([place]) 08/16/2019  . Cardiac LV ejection fraction 30-35% 03/29/2019  . Dyslipidemia associated with type 2 diabetes mellitus ([place]) 03/29/2019   . Major depression with psychotic features ([place]) 03/29/2019  . History of CVA (cerebrovascular accident) 03/20/2019  . Edema of both lower legs due to peripheral venous insufficiency 10/12/2018  . Anemia 05/12/2018  . S/P total knee replacement, right 05/05/18 05/12/2018  . Type 2 diabetes mellitus with neurological complications ([place]) [id number]  . Primary osteoarthritis of right knee   . Status post surgery 01/26/2018  . Surgery, elective 01/26/2018  . Degenerative spondylolisthesis 01/26/2018  . Osteoporosis 12/13/2016  . Osteopenia 11/26/2016  . Invasive ductal carcinoma of breast, female, right ([place])   . Lumbago 03/22/2014  . Stiffness of joint, not elsewhere classified, pelvic region and thigh 03/22/2014  . Muscle weakness (generalized) 03/22/2014  . Abnormality of gait 03/22/2014  . Depression with anxiety 09/03/2012  . Insomnia secondary to depression with anxiety 09/03/2012  . Pain 09/03/2012   Review of Neuroimaging and Relevant Medical History: :  The patient has an MRI of the brain from 08/17/2019 that is degraded due to motion artifact. She has a study from 03/21/2019 that is clearer, and shows a moderate burden of confluent leukoaraiosis in the periventricular and subcortical cerebral white matter perhaps enough in and of itself to cause a dementia in my opinion. There is a mild diffuse burden of atrophy without any particular focality. There are areas of blooming artifact in multiple lobar territories bilaterally suggestive of cerebral microhemorrhage. There are acute findings in the left precentral gyrus and parietal lobule and a remote infarct in thhe right cerebellum as described in the radiology report.   Current Outpatient Medications  Medication Sig Dispense Refill  . ALPRAZolam (XANAX) 0.5 MG tablet Take 1 tablet (0.5 mg total) by mouth at bedtime. 30 tablet 2  . aspirin EC 81 MG tablet Take 81 mg by mouth every morning.    Marland Kitchen atorvastatin (LIPITOR) 40 MG tablet Take  1 tablet (40 mg total) by mouth daily at 6 PM. 90 tablet 2  . calcium carbonate (OSCAL) 1500 (600 Ca) MG TABS tablet Take 600 mg of elemental calcium by mouth daily with supper.     . cephALEXin (KEFLEX) 500 MG capsule Take 4 capsules (2,000 mg total) by mouth once for 1 dose. One hour prior to dental work 4 capsule 2  . cholecalciferol (VITAMIN D) 1000 units tablet Take 1 tablet (1,000 Units total) by mouth daily. 30 tablet 11  . cyanocobalamin (,VITAMIN B-12,) 1000 MCG/ML injection Daily for 4 days; then weekly for 1 month and then monthly after that. 12 mL 0  . escitalopram (LEXAPRO) 20 MG tablet Take 1 tablet (20 mg total) by mouth daily. 30 tablet 2  . gabapentin (NEURONTIN) 300 MG capsule Take 1 capsule (300 mg total) by mouth at bedtime. 30 capsule 2  . letrozole (FEMARA) 2.5 MG tablet TAKE 1 TABLET(2.5 MG) BY MOUTH DAILY 90 tablet 3  . losartan (COZAAR) 25 MG tablet Take 0.5 tablets (12.5 mg total) by mouth at bedtime. 45 tablet 3  . metFORMIN (GLUCOPHAGE) 500 MG tablet Take 1 tablet (500 mg total) by mouth 2 (two) times daily with a meal. 60 tablet  0  . metoprolol succinate (TOPROL XL) 25 MG 24 hr tablet Take 1 tablet (25 mg total) by mouth daily. 30 tablet 11  . risperiDONE (RISPERDAL) 1 MG tablet Take 1 tablet (1 mg total) by mouth at bedtime. 30 tablet 2  . traZODone (DESYREL) 50 MG tablet Take 1 tablet (50 mg total) by mouth at bedtime. 30 tablet 2  . venlafaxine XR (EFFEXOR-XR) 150 MG 24 hr capsule Take 1 capsule (150 mg total) by mouth 2 (two) times daily. 60 capsule 2   No current facility-administered medications for this visit.    Family History  Problem Relation Age of Onset  . Anxiety disorder Mother   . Depression Mother   . Depression Maternal Grandmother   . Alcohol abuse Brother   . Cancer - Other Sister   . ADD / ADHD Neg Hx   . Bipolar disorder Neg Hx   . Dementia Neg Hx   . Drug abuse Neg Hx   . OCD Neg Hx   . Paranoid behavior Neg Hx   . Schizophrenia Neg Hx    . Seizures Neg Hx   . Sexual abuse Neg Hx   . Physical abuse Neg Hx     There is no  family history of dementia. Her mother died early in her 41s and her father died in his 36s and neither of them had any memory or thinking problems. She has one sister who is still alive and a sister and a brother who passed away. There is a family history of psychiatric illness, many family members had issues with depression.  Psychosocial History  Developmental, Educational and Employment History: The patient reported that she was a good student who was never held back and didn't have any learning difficulties. She went on to get a degree in nursing and was an Therapist, sports. She also took some community college classes. 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 She retired many years ago, she wasn't sure when and her son wasn't entirely sure either.   Psychiatric History: The patient has a history of depression and anxiety. She stated that she had some involvement in therapy. On chart review she has been following with Dr. [name] (Psychiatrist) for many years and also has some involvement with Dr. Frederick Peers.   Substance Use History: The patient denied any significant history of alcohol or drug issues. She used to smoke and quit many years ago, she wasn't sure when.   Relationship History and Living Cimcumstances: The patient was married previously, she wasn't sure how long she was married for or when exactly she got divorced. She has two children, her son Amy [name] is the primary one involved in her care. Her daughter is somewhat estranged.   Mental Status and Behavioral Observations  Sensorium/Arousal: The patient's level of arousal was awake and alert. Hearing and vision were marginal for testing purposes; she has hearing problems and does not wear hearing aids, which interfered at times. Orientation: The patient was oriented to person, situation, and to some extent time (off on the month and the date) but  was not oriented to place.  Appearance: Dressed in appropriate, casual clothing with reasonable grooming and hygiene Behavior: The patient presented as quite impaired and had a hard time supplying some of her history.  Speech/language: Normal in rate, rhythm, and perhaps mildly hypophonic at times.  Gait/Posture: The patient ambulated slowly, using a walker, and appeared unsteady.  Movement: The patient appeared somewhat flat and hypokinetic  but not bradykinetic. Some difficulties using a writing implement were noted that may have affected her scores on some psychomotor tests.  Social Comportment: Appropriate Mood: Patient reported that she gets lonely Affect: Neutral to blunted Thought process/content: Thought process was difficult to assess given paucity of verbal output. She was able to provide some information in response to questions but often times was confused about what was enquired. Thought content was appropriate to the topics discussed.  Safety: No thoughts of harming self or others identified at this encounter.  Insight: Questionable, patient thinks she is fine and doesn't seem entirely aware of the issues her son reports.   MMSE - Mini Mental State Exam 11/22/2019 09/06/2019  Orientation to time 3 3  Orientation to Place 4 5  Registration 3 3  Attention/ Calculation 3 0  Recall 1 2  Language- name 2 objects 2 2  Language- repeat 0 1  Language- follow 3 step command 3 3  Language- read & follow direction 1 1  Write a sentence 1 1  Copy design 0 1  Total score 21 22   Test Procedures  Wide Range Achievement Test - 4             Word Reading Neuropsychological Assessment Battery  List Learning  Story Learning  Naming  Digit Span Repeatable Battery for the Assessment of Neuropsychological Status (Form A)  Figure Copy  Judgment of Line Orientation  Coding  Figure Recall The Dot Counting Test A Random Letter Test Controlled Oral Word Association (F-A-S) Semantic Fluency  (Animals) Trail Making Test A & B Complex Ideational Material Geriatric Depression Scale - Short Form Quick Dementia Rating System (completed by son, Amy [name])  Plan  GUSTA ARKWRIGHT was seen for a psychiatric diagnostic evaluation and neuropsychological testing. On initial impression, she seems to be quite impaired. Her recurrent hospitalizations despite home health suggest that she is not managing well at home even with support. Review of notes from her other medical providers suggests that she is quite lucid at times, although her MMSE is in the dementia range, she has no instrumental activities of daily living, and I think there is fairly substantial cognitive impairment at least as per her presentation today. Full and complete note with impressions, recommendations, and interpretation of test data to follow. She was recently in the hospital with a confusional episode in March and may still be postacute related to frailty. She is also on numerous brain impairing medications. Full and complete report to follow with impressions and recommendations, pending norming and interpretation.   Viviano Simas Nicole Kindred, PsyD, [place] Clinical Neuropsychologist  Informed Consent and Coding/Compliance  Risks and benefits of the evaluation were discussed with the patient as were the limits of confidentiality. I conducted a clinical interview and neuropsychological testing (more than two tests) with [name] and Milana Kidney, B.S. (Technician) assisted me in administering additional test procedures. The patient was able to tolerate the testing procedures and the patient (and/or family if applicable) is likely to benefit from further follow up to receive the diagnosis and treatment recommendations, which will be rendered at the next encounter. Billing below reflects technician time, my direct face-to-face time with the patient, time spent in test administration, and time spent in professional activities including  but not limited to: neuropsychological test interpretation, integration of neuropsychological test data with clinical history, report preparation, treatment planning, care coordination, and review of diagnostically pertinent medical history or studies.   Services associated with this encounter: Clinical Interview (  [id number]) plus 60 minutes [id number]; Neuropsychological Evaluation by Professional)  180 minutes [id number]; Neuropsychological Evaluation by Professional, Adl.) 30 minutes [id number]; Test Administration by Professional) 30 minutes [id number]; Neuropsychological Testing by Technician) 85 minutes [id number]; Neuropsychological Testing by Technician, Adl.)

## 2019-11-22 NOTE — Progress Notes (Signed)
   Psychometrist Note   Cognitive testing was administered to Amy [name] by Milana Kidney, B.S. (Technician) under the supervision of Alphonzo Severance, Psy.D., ABN. Ms. Montejano was able to tolerate all test procedures. Dr. Nicole Kindred met with the patient as needed to manage any emotional reactions to the testing procedures (if applicable). Rest breaks were offered.    The battery of tests administered was selected by Dr. Nicole Kindred with consideration to the patient's current level of functioning, the nature of her symptoms, emotional and behavioral responses during the interview, level of literacy, observed level of motivation/effort, and the nature of the referral question. This battery was communicated to the psychometrist. Communication between Dr. Nicole Kindred and the psychometrist was ongoing throughout the evaluation and Dr. Nicole Kindred was immediately accessible at all times. Dr. Nicole Kindred provided supervision to the technician on the date of this service, to the extent necessary to assure the quality of all services provided.    Amy [name] will return in approximately one week for an interactive feedback session with Dr. Nicole Kindred, at which time female test performance, clinical impressions, and treatment recommendations will be reviewed in detail. The patient understands she can contact our office should she require our assistance before this time.   A total of 115 minutes of billable time were spent with Amy [name] by the technician, including test administration and scoring time. Billing for these services is reflected in Dr. Les Pou note.   This note reflects time spent with the psychometrician and does not include test scores, clinical history, or any interpretations made by Dr. Nicole Kindred. The full report will follow in a separate note.

## 2019-11-22 NOTE — Telephone Encounter (Signed)
Call received from dental office, Dr Laretta Alstrom, ph# [phone] / fax# [phone], regarding pre-medication per call from dental office. States patient relayed to dental staff that Dr Aline Brochure "called in a prescription for her" but said patient didn't know what it was. Please advise - states can something be faxed to above number regarding pre-med?

## 2019-11-24 ENCOUNTER — Ambulatory Visit (INDEPENDENT_AMBULATORY_CARE_PROVIDER_SITE_OTHER): Payer: Medicare HMO | Admitting: *Deleted

## 2019-11-24 DIAGNOSIS — I639 Cerebral infarction, unspecified: Secondary | ICD-10-CM | POA: Diagnosis not present

## 2019-11-24 LAB — CUP PACEART REMOTE DEVICE CHECK
Date Time Interrogation Session: 20210428011150
Implantable Pulse Generator Implant Date: 20210325

## 2019-11-25 NOTE — Progress Notes (Signed)
ILR Remote 

## 2019-11-26 NOTE — Progress Notes (Signed)
[facility]  Patient Name: Amy [name] MRN: [id number] Date of Birth: July 21, 1936 Age: 84 y.o. Education: 14 years  Measurement properties of test scores: IQ, Index, and Standard Scores (SS): Mean = 100; Standard Deviation = 15 Scaled Scores (Ss): Mean = 10; Standard Deviation = 3 Z scores (Z): Mean = 0; Standard Deviation = 1 T scores (T); Mean = 50; Standard Deviation = 10  TEST SCORES:    Note: This summary of test scores accompanies the interpretive report and should not be considered in isolation without reference to the appropriate sections in the text. Test scores are relative to age, gender, and educational history as available and appropriate.   Performance Validity        "A" Random Letter Test Raw  Descriptor      Errors 5 Below Expectation  The Dot Counting Test: 18 Within Expectation      Mental Status Screening     Total Score Descriptor  MMSE "World" 21 Mild Dementia  MMSE Serial 7's 18 Moderate Dementia  Expected Functioning        Wide Range Achievement Test: Standard/Scaled Score Percentile      Word Reading 100 50      Attention/Processing Speed        Neuropsychological Assessment Battery (Attention Module, Form 1): T-score Percentile      Digits Forward 45 31      Digits Backwards 31 3      Repeatable Battery for the Assessment of Neuropsychological Status (Form A): Scaled Score Percentile      Coding 2 <1      Language        Neuropsychological Assessment Battery (Language Module, Form 1): T-score Percentile      Naming   (18) 19 <1      Verbal Fluency: T-score Percentile      Controlled Oral Word Association (F-A-S) 18 <1      Semantic Fluency (Animals) 21 <1      Memory:        Neuropsychological Assessment Battery (Memory Module, Form 1): T-score Percentile      List Learning           List A Immediate Recall   (1, 1, 3) 19 <1         List B Immediate Recall   (1) 31 3         List A Short  Delayed Recall   (0) 22 <1         List A Long Delayed Recall   (1) 31 3         List A Long Delayed Yes/No Recognition Hits   (5) --- <1         List A Long Delayed Yes/No Recognition False Alarms   (12) --- 5         List A Recognition Discriminability Index --- <1     Story Learning           Immediate Recall   (8, 16) 22 <1         Delayed Recall   (21) 36 8      Repeatable Battery for the Assessment of Neuropsychological Status (Form A): Scaled Score Percentile         Figure Recall   (0) 1 <1      Visuospatial/Constructional Functioning        Repeatable Battery for the Assessment of Neuropsychological Status (Form A): Standard/Scaled Score Percentile  Visuospatial/Constructional Index 100 50         Figure Copy   (17) 10 50         Judgment of Line Orientation   (16) --- 26-50      Executive Functioning            Trail Making Test: T-Score Percentile      Part A 25 1      Part B 21 <1      [place] Diagnostic Aphasia Exam: Raw Score Scaled Score      Complex Ideational Material 6 2      Clock Drawing Raw Score Descriptor      Command 5 Moderate Impairment      Rating Scales         Raw Score Descriptor  Quick Dementia Rating System        Sum of Boxes 12 Moderate Dementia      Total Score 17 Moderate Dementia  Geriatric Depression Scale - Short Form 6 Depressed   Peter V. Nicole Kindred PsyD, [place] Clinical Neuropsychologist

## 2019-11-29 ENCOUNTER — Encounter: Payer: Medicare HMO | Admitting: Counselor

## 2019-11-29 ENCOUNTER — Other Ambulatory Visit: Payer: Self-pay

## 2019-11-29 ENCOUNTER — Ambulatory Visit (INDEPENDENT_AMBULATORY_CARE_PROVIDER_SITE_OTHER): Payer: Medicare HMO | Admitting: Counselor

## 2019-11-29 ENCOUNTER — Encounter: Payer: Self-pay | Admitting: Counselor

## 2019-11-29 DIAGNOSIS — F0151 Vascular dementia with behavioral disturbance: Secondary | ICD-10-CM

## 2019-11-29 DIAGNOSIS — F418 Other specified anxiety disorders: Secondary | ICD-10-CM | POA: Diagnosis not present

## 2019-11-29 DIAGNOSIS — F01518 Vascular dementia, unspecified severity, with other behavioral disturbance: Secondary | ICD-10-CM

## 2019-11-29 NOTE — Progress Notes (Signed)
[facility]  I met with Amy [name] to review the findings resulting from her neuropsychological evaluation. Since the last appointment, she has been about the same. She was having an off day, according to her son, because she forgot to take her medication. This is despite the fact that they have an automatic pill dispenser with alarms and lights. Time was spent reviewing the impressions and recommendations that are detailed in the evaluation report. I used Amy [name]'s recent difficulties as an example of why I am concerned about her living alone without more supervision. Her son agrees and has arranged for aids to be with her now 6 days a week, which they are trying as an intermediate step because the patient vehemently refuses to consider assisted living. Interventions provided during this encounter included psychoeducation, and other topics as reflected in the patient instructions. I took time to explain the findings and answer all the patient's questions. I encouraged Amy [name] to contact me should she have any further questions or if further follow up is desired.   Current Medications and Medical History   Current Outpatient Medications  Medication Sig Dispense Refill   ALPRAZolam (XANAX) 0.5 MG tablet Take 1 tablet (0.5 mg total) by mouth at bedtime. 30 tablet 2   aspirin EC 81 MG tablet Take 81 mg by mouth every morning.     atorvastatin (LIPITOR) 40 MG tablet Take 1 tablet (40 mg total) by mouth daily at 6 PM. 90 tablet 2   calcium carbonate (OSCAL) 1500 (600 Ca) MG TABS tablet Take 600 mg of elemental calcium by mouth daily with supper.      cholecalciferol (VITAMIN D) 1000 units tablet Take 1 tablet (1,000 Units total) by mouth daily. 30 tablet 11   cyanocobalamin (,VITAMIN B-12,) 1000 MCG/ML injection Daily for 4 days; then weekly for 1 month and then monthly after that. 12 mL 0   escitalopram (LEXAPRO) 20 MG tablet Take 1 tablet  (20 mg total) by mouth daily. 30 tablet 2   gabapentin (NEURONTIN) 300 MG capsule Take 1 capsule (300 mg total) by mouth at bedtime. 30 capsule 2   letrozole (FEMARA) 2.5 MG tablet TAKE 1 TABLET(2.5 MG) BY MOUTH DAILY 90 tablet 3   losartan (COZAAR) 25 MG tablet Take 0.5 tablets (12.5 mg total) by mouth at bedtime. 45 tablet 3   metFORMIN (GLUCOPHAGE) 500 MG tablet Take 1 tablet (500 mg total) by mouth 2 (two) times daily with a meal. 60 tablet 0   metoprolol succinate (TOPROL XL) 25 MG 24 hr tablet Take 1 tablet (25 mg total) by mouth daily. 30 tablet 11   risperiDONE (RISPERDAL) 1 MG tablet Take 1 tablet (1 mg total) by mouth at bedtime. 30 tablet 2   traZODone (DESYREL) 50 MG tablet Take 1 tablet (50 mg total) by mouth at bedtime. 30 tablet 2   venlafaxine XR (EFFEXOR-XR) 150 MG 24 hr capsule Take 1 capsule (150 mg total) by mouth 2 (two) times daily. 60 capsule 2   No current facility-administered medications for this visit.    Patient Active Problem List   Diagnosis Date Noted   Cryptogenic stroke ([facility]) 10/21/2019   Palpitations 10/21/2019   Cognitive impairment    UTI (urinary tract infection) 10/05/2019   Dehydration    Major depressive disorder, recurrent episode, mild with anxious distress (HCC)    Acute lower UTI    Deficiency of vitamin B12    Physical deconditioning    Acute  metabolic encephalopathy 99/71/8209   Acute encephalopathy 08/16/2019   Mild renal insufficiency 90/68/9340   Chronic systolic CHF (congestive heart failure) ([place]) 08/16/2019   Cardiac LV ejection fraction 30-35% 03/29/2019   Dyslipidemia associated with type 2 diabetes mellitus ([employer]) 03/29/2019   Major depression with psychotic features ([place]) 03/29/2019   History of CVA (cerebrovascular accident) 03/20/2019   Edema of both lower legs due to peripheral venous insufficiency 10/12/2018   Anemia 05/12/2018   S/P total knee replacement, right 05/05/18 05/12/2018   Type 2  diabetes mellitus with neurological complications ([place]) 68/40/3353   Primary osteoarthritis of right knee    Status post surgery 01/26/2018   Surgery, elective 01/26/2018   Degenerative spondylolisthesis 01/26/2018   Osteoporosis 12/13/2016   Osteopenia 11/26/2016   Invasive ductal carcinoma of breast, female, right ([place])    Lumbago 03/22/2014   Stiffness of joint, not elsewhere classified, pelvic region and thigh 03/22/2014   Muscle weakness (generalized) 03/22/2014   Abnormality of gait 03/22/2014   Depression with anxiety 09/03/2012   Insomnia secondary to depression with anxiety 09/03/2012   Pain 09/03/2012    Mental Status and Behavioral Observations  Amy [name] was available at the scheduled time for this appointment. She was awake and alert although presented as somewhat disoriented and had a hard time following some of the topics that were discussed. Her self-reported mood was anxious, regarding our encounter, and her affect was congruent. Thought process was circumstantial to tangential and she had a hard time tracking topics that were discussed, often making verbalizations that were off topic. There were no safety concerns identified at today's encounter  Plan  Feedback provided regarding the patient's neuropsychological evaluation. Overall impression is at least mild level dementia, although her psych medications and recent episode of AMS are likely throwing her off and she could realize some improvement with better adherence to medication instructions and the like. Amy [name] was encouraged to contact me if any questions arise or if further follow up is desired.   Viviano Simas Nicole Kindred, PsyD, ABN Clinical Neuropsychologist  Service(s) Provided at This Encounter: 50 minutes [phone]; Conjoint therapy with patient present)

## 2019-11-29 NOTE — Progress Notes (Signed)
[facility]  Patient Name: Amy [name] MRN: [id number] Date of Birth: [birth year] Age: 84 y.o. Education: 29 years  Clinical Impressions  Amy [name] is a 84 y.o., right-hand dominant, divorced woman with a stroke affecting the left precentral gyrus and left superior parietal lobule, significant small vessel ischemic disease, and numerous recent hospitalizations for various issues including recently in March, 202021 for dehydration and overtaking blood pressure medication. She was referred by Dr. Shanon Brow Tat with [facility]. She has an MRI from 03/21/2019 that shows a moderate burden of confluent luekoaraiosis, perhaps enough in and of itself to cause a dementia level of function, the stroke findings mentioned above, and areas of blooming artifact suggestive of remote microhemorrhage. There is mild diffuse atrophy in a nondiagnostic distribution.   Neuropsychological testing shows objective evidence of significant cognitive impairment that appears quite diffuse. This is with the exception of visuospatial and constructional measures, on which she performed at a normal average range. Her memory profile is concerning for a storage problem although she did retain some structured information across time, suggesting that there is an executive contribution in addition to any other primary amnestic type memory issues. She also has naming problems. Low scores were obtained on all measures of executive function and processing speed.  She screened positive for the presence of depression, gets agitated, and her son rated her as functioning at a moderate dementia level.   Amy [name] is thus demonstrating significant cognitive impairment amounting to at least a mild dementia level of function. The differential favors mixed Alzheimer's and vascular disease given cognitive test data, her neuroimaging, demographics, and clinical history. Postacute issues from  her recent hospitalization and cognitive interference from medications in this frail patient are also likely contributory.    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 You did relatively well, however, on digit repetition forward and measures of visuospatial abilities. I think that the best descriptor for the level of problem you have right now is mild dementia, although you may be functioning more at a moderate level due to reversible causes that could be optimized.   Dementia refers to a group of syndromes where multiple areas of ability are damaged in the brain, such as memory, thinking, judgment, and behavior, and most commonly refer to age related causes of dementia that cause worsening in these abilities over time. Alzheimer's disease is the most common form of dementia in people over the age of 82. Not all dementias are Alzheimer's disease, but all Alzheimer's disease is dementia. When dementia is due to an underlying condition affecting the brain, such as Alzheimer's disease, there is progression over time, which typically procedes gradually over many years.   In your case, I think that your dementia is likely due to a combination of cerebrovascular disease (strokes, and wear and tear on blood vessels) and Alzheimer's disease. This means that we would expect your condition to progress over time. Despite the fact that this is a progressive condition, I think there are reversible causes that are probably decreasing your functioning relative to what is possible, including multiple brain impairing medications and the fact that you were just recently in the hospital with altered mental status.   I would suggest that you  talk with Dr. [name]  regarding your current medication regimen, which includes medications that can be brain impairing. There is obviously a cost-benefit analysis that must be conducted here, because you are on those medications for a reason and it may not be possible to control your symptoms without some cognitive side effects.   Instead of medications, I recommend behavioral strategies for dealing with the behavioral and psychological issues that can accompany dementia. Things like agitation, wandering, and anxiety can often be improved or eliminated using the "three R's." Redirection (help distract your loved one by focusing their attention on something else, moving them to a new environment, or otherwise engaging them in something other than what is distressing to them), Reassurance (reassure them that you are there to take care of them and that there is nothing they need to be worried about), and Reconsidering (consider the situation from their perspective and try to identify if there is something about the situation or environment that may be triggering their reaction). Often times, agitation can be a sign of underlying illness, unmet needs (e.g., uncomfortable due to incontinence, temperature issues), overstimulation, or under stimulation.   At your current level of functioning, I think your ability to live independently without more support is tenuous. Care levels for individuals at your level of functioning include settings with a significant level of supervision and support such as assisted living with extensive assistance, home with significant help, and also skilled nursing facilities. This is particularly true if you have physical issues in addition to your dementia, which you do.   Individuals with dementia are prone to developing delirium and altered mental status related to infections, inadequate or suboptimal nutrition, medications, and underlying illnesses. This makes it all the more important  for you to take the best care of yourself possible, including taking all your medications on time and on a schedule, eating adequately, getting good sleep, and practicing good grooming and hygiene practices to minimize risk of infection. If you have a hard time doing that for yourself, which seems to be the case, then I would recommend that you receive more assistance because these episodes can cumulatively contribute to further cognitive decline.   You are not driving and I would recommend that you continue to refrain from driving.   You were recently in the hospital and have some mobility and strength issues. You might consider talking with your primary care practitioner regarding a referral for PT to help with strengthening. This not only helps you function better but also helps you avoid injury, which is important.   Some individuals with mild dementia are able to make complex heathcare decisions and some are not. In your case, I think that your failure to care for yourself adequately at home and insistence on continuing to stay at home despite the fact that you have not been doing well there suggests that you may have limited insight and would benefit from having your son or another trusted representative assist you with medical and financial decisions of high risk. This includes decisions about the appropriate level of care for you at this point in time.   Test Findings  Test scores are summarized in additional documentation associated with this encounter. Test scores are relative to age, gender, and educational history as available and appropriate. There were no concerns about performance validity despite one indicator falling below expectations, which more than likely is a demonstration of this patient's level of impairment as opposed to a validity problem per se.   General Intellectual Functioning/Achievement:  Performance on single word reading was average, which presents as a reasonable  standard of comparison for the patient's cognitive test data.   Attention and Processing Efficiency: Performance on indicators of attention and working memory was mixed with reasonable average digit repetition forward but unusually low digit repetition backward. Patient also had difficulty with serial subtraction of 7's from 100 on the MMSE with 0/5 correct.  With respect to processing speed, timed number symbol coding and simple numeric sequencing were both extremely low.   Language: Performance on language measures was extremely low for visual object confrontation naming, phonemic fluency, and semantic fluency.   Visuospatial Function: Visuospatial and constructional functioning represented an area of significant strength for this patient who demonstrated an average score. Copy of a line drawing and judgment of angular line orientations were both average.   Learning and Memory: Performance on learning and memory measures suggested impaired capacities; however, the patient was able to retain some information across time and retained structured better than unstructured information, suggesting a bit of residual capacity.   In the verbal realm, her learning for a 12-item word list was 1, 1, and 3 words across three learning trials followed by 0 words at short delayed recall but she then recalled 1 word at long delayed recall. Nevertheless, that does demonstrate poor retention of information across time with scores falling in the unusually to extremely low range. Delayed yes/no recognition for the short words from the list was poor with many more false positive errors than correct identifications. She did a bit better with structured information with extremely low short story learning and stronger retention with unusually low delayed free recall of a short story.   In the visual realm, delayed recall for a modestly complex figure was poor; she did not recall any of the figure.   Executive  Functions: Performance was low on all measures of executive capacities with extremely low performance on reasoning with verbal information on the Complex Ideational Material, on alternating sequencing of numbers and letters of the alphabet (which had to be discontinued nearly at the time limit), and when generating words in response to the letters F-A-S. Clock drawing was consistent with "moderate" impairment with errors in spatial arrangement of the numbers and only one hand despite being given time and asked to set them multiple times.   Rating Scale(s): The patient was characterized as functioning at a moderate dementia level by her son, which is reasonable, although given that she was recently in the hospital and may improve somewhat, mild dementia presents as the best severity rating at this point in time. She screened positive for the presence of depression.   Viviano Simas Nicole Kindred PsyD, [place] Clinical Neuropsychologist

## 2019-12-01 ENCOUNTER — Other Ambulatory Visit (HOSPITAL_COMMUNITY): Payer: Self-pay | Admitting: Nurse Practitioner

## 2019-12-01 DIAGNOSIS — N63 Unspecified lump in unspecified breast: Secondary | ICD-10-CM

## 2019-12-03 ENCOUNTER — Encounter (HOSPITAL_COMMUNITY): Payer: Medicare HMO

## 2019-12-07 ENCOUNTER — Encounter (HOSPITAL_COMMUNITY): Payer: Medicare HMO

## 2019-12-07 ENCOUNTER — Ambulatory Visit (HOSPITAL_COMMUNITY)
Admission: RE | Admit: 2019-12-07 | Discharge: 2019-12-07 | Disposition: A | Discharge status: Home / Self Care | Payer: Medicare HMO | Source: Ambulatory Visit | Attending: Nurse Practitioner | Admitting: Nurse Practitioner

## 2019-12-07 ENCOUNTER — Other Ambulatory Visit (HOSPITAL_COMMUNITY): Payer: Self-pay | Admitting: Nurse Practitioner

## 2019-12-07 ENCOUNTER — Other Ambulatory Visit: Payer: Self-pay

## 2019-12-07 DIAGNOSIS — N63 Unspecified lump in unspecified breast: Secondary | ICD-10-CM

## 2019-12-07 DIAGNOSIS — Z08 Encounter for follow-up examination after completed treatment for malignant neoplasm: Secondary | ICD-10-CM | POA: Diagnosis not present

## 2019-12-07 DIAGNOSIS — Z853 Personal history of malignant neoplasm of breast: Secondary | ICD-10-CM | POA: Diagnosis not present

## 2019-12-07 DIAGNOSIS — C50911 Malignant neoplasm of unspecified site of right female breast: Secondary | ICD-10-CM

## 2019-12-11 ENCOUNTER — Other Ambulatory Visit: Payer: Self-pay

## 2019-12-11 ENCOUNTER — Emergency Department (HOSPITAL_COMMUNITY): Payer: Medicare HMO

## 2019-12-11 ENCOUNTER — Encounter (HOSPITAL_COMMUNITY): Payer: Self-pay | Admitting: Emergency Medicine

## 2019-12-11 ENCOUNTER — Ambulatory Visit
Admission: EM | Admit: 2019-12-11 | Discharge: 2019-12-11 | Disposition: A | Discharge status: Home / Self Care | Payer: Medicare HMO | Source: Home / Self Care

## 2019-12-11 ENCOUNTER — Ambulatory Visit (INDEPENDENT_AMBULATORY_CARE_PROVIDER_SITE_OTHER): Payer: Medicare HMO

## 2019-12-11 ENCOUNTER — Emergency Department (HOSPITAL_COMMUNITY)
Admission: EM | Admit: 2019-12-11 | Discharge: 2019-12-11 | Disposition: A | Discharge status: Home / Self Care | Payer: Medicare HMO | Attending: Emergency Medicine | Admitting: Emergency Medicine

## 2019-12-11 DIAGNOSIS — Y939 Activity, unspecified: Secondary | ICD-10-CM | POA: Insufficient documentation

## 2019-12-11 DIAGNOSIS — R599 Enlarged lymph nodes, unspecified: Secondary | ICD-10-CM

## 2019-12-11 DIAGNOSIS — Z7984 Long term (current) use of oral hypoglycemic drugs: Secondary | ICD-10-CM | POA: Insufficient documentation

## 2019-12-11 DIAGNOSIS — Y998 Other external cause status: Secondary | ICD-10-CM | POA: Diagnosis not present

## 2019-12-11 DIAGNOSIS — S299XXA Unspecified injury of thorax, initial encounter: Secondary | ICD-10-CM | POA: Diagnosis present

## 2019-12-11 DIAGNOSIS — Y92018 Other place in single-family (private) house as the place of occurrence of the external cause: Secondary | ICD-10-CM | POA: Diagnosis not present

## 2019-12-11 DIAGNOSIS — Z79899 Other long term (current) drug therapy: Secondary | ICD-10-CM | POA: Diagnosis not present

## 2019-12-11 DIAGNOSIS — S2231XA Fracture of one rib, right side, initial encounter for closed fracture: Secondary | ICD-10-CM | POA: Insufficient documentation

## 2019-12-11 DIAGNOSIS — Z8673 Personal history of transient ischemic attack (TIA), and cerebral infarction without residual deficits: Secondary | ICD-10-CM | POA: Diagnosis not present

## 2019-12-11 DIAGNOSIS — E119 Type 2 diabetes mellitus without complications: Secondary | ICD-10-CM | POA: Diagnosis not present

## 2019-12-11 DIAGNOSIS — Z96651 Presence of right artificial knee joint: Secondary | ICD-10-CM | POA: Diagnosis not present

## 2019-12-11 DIAGNOSIS — W19XXXA Unspecified fall, initial encounter: Secondary | ICD-10-CM | POA: Diagnosis not present

## 2019-12-11 DIAGNOSIS — R0781 Pleurodynia: Secondary | ICD-10-CM

## 2019-12-11 DIAGNOSIS — R591 Generalized enlarged lymph nodes: Secondary | ICD-10-CM | POA: Insufficient documentation

## 2019-12-11 DIAGNOSIS — Z87891 Personal history of nicotine dependence: Secondary | ICD-10-CM | POA: Diagnosis not present

## 2019-12-11 DIAGNOSIS — Z7982 Long term (current) use of aspirin: Secondary | ICD-10-CM | POA: Diagnosis not present

## 2019-12-11 DIAGNOSIS — R9389 Abnormal findings on diagnostic imaging of other specified body structures: Secondary | ICD-10-CM

## 2019-12-11 LAB — CBC WITH DIFFERENTIAL/PLATELET
Abs Immature Granulocytes: 0.02 10*3/uL (ref 0.00–0.07)
Basophils Absolute: 0 10*3/uL (ref 0.0–0.1)
Basophils Relative: 0 %
Eosinophils Absolute: 0.2 10*3/uL (ref 0.0–0.5)
Eosinophils Relative: 3 %
HCT: 34.1 % — ABNORMAL LOW (ref 36.0–46.0)
Hemoglobin: 11.1 g/dL — ABNORMAL LOW (ref 12.0–15.0)
Immature Granulocytes: 0 %
Lymphocytes Relative: 16 %
Lymphs Abs: 1 10*3/uL (ref 0.7–4.0)
MCH: 30.7 pg (ref 26.0–34.0)
MCHC: 32.6 g/dL (ref 30.0–36.0)
MCV: 94.2 fL (ref 80.0–100.0)
Monocytes Absolute: 0.5 10*3/uL (ref 0.1–1.0)
Monocytes Relative: 8 %
Neutro Abs: 4.4 10*3/uL (ref 1.7–7.7)
Neutrophils Relative %: 73 %
Platelets: 161 10*3/uL (ref 150–400)
RBC: 3.62 MIL/uL — ABNORMAL LOW (ref 3.87–5.11)
RDW: 11.8 % (ref 11.5–15.5)
WBC: 6 10*3/uL (ref 4.0–10.5)
nRBC: 0 % (ref 0.0–0.2)

## 2019-12-11 LAB — BASIC METABOLIC PANEL
Anion gap: 7 (ref 5–15)
BUN: 12 mg/dL (ref 8–23)
CO2: 29 mmol/L (ref 22–32)
Calcium: 8.8 mg/dL — ABNORMAL LOW (ref 8.9–10.3)
Chloride: 104 mmol/L (ref 98–111)
Creatinine, Ser: 0.76 mg/dL (ref 0.44–1.00)
GFR calc Af Amer: >60 mL/min (ref >60)
GFR calc non Af Amer: >60 mL/min (ref >60)
Glucose, Bld: 78 mg/dL (ref 70–99)
Potassium: 3.9 mmol/L (ref 3.5–5.1)
Sodium: 140 mmol/L (ref 135–145)

## 2019-12-11 LAB — URINALYSIS, ROUTINE W REFLEX MICROSCOPIC
Bilirubin Urine: NEGATIVE
Glucose, UA: NEGATIVE mg/dL
Hgb urine dipstick: NEGATIVE
Ketones, ur: 5 mg/dL — AB
Leukocytes,Ua: NEGATIVE
Nitrite: NEGATIVE
Protein, ur: NEGATIVE mg/dL
Specific Gravity, Urine: 1.015 (ref 1.005–1.030)
pH: 6 (ref 5.0–8.0)

## 2019-12-11 MED ORDER — IOHEXOL 350 MG/ML SOLN
100.0000 mL | Freq: Once | INTRAVENOUS | Status: AC | PRN
  Administered 2019-12-11: 100 mL via INTRAVENOUS

## 2019-12-11 MED ORDER — OXYCODONE-ACETAMINOPHEN 5-325 MG PO TABS
1.0000 | ORAL_TABLET | Freq: Once | ORAL | Status: AC
  Administered 2019-12-11: 1 via ORAL
  Filled 2019-12-11: qty 1

## 2019-12-11 NOTE — ED Triage Notes (Signed)
Pt. Amy [name] stated, she fell Wednesday night and complain of rt. Rib pain. We went to [facility] this am and they did a chest xray and said she had an aorta annuresym and told to come here.

## 2019-12-11 NOTE — ED Triage Notes (Signed)
Pt presents with c/o fall on Wednesday and hit right side, has pain 8/10 in right rib area

## 2019-12-11 NOTE — ED Notes (Signed)
Assisted pt off bedpan, urine  sample collected.

## 2019-12-11 NOTE — ED Notes (Signed)
Patient verbalizes understanding of discharge instructions. Opportunity for questioning and answers were provided. Armband removed by staff, pt discharged from ED via wheelchair to go home with son.

## 2019-12-11 NOTE — ED Notes (Signed)
One unsuccessful IV attempt. PA will attempt an IV using [place].

## 2019-12-11 NOTE — Discharge Instructions (Signed)
X-rays did not show fracture or dislocation.  However, x-rays were concerning for accentuation of the descending thoracic aortic contour, which could be the vessel dissecting.  Radiologist is recommending further evaluation and management in the ED for CT scan.  Patient aware and in agreement.

## 2019-12-11 NOTE — ED Provider Notes (Signed)
[facility] EMERGENCY DEPARTMENT Provider Note   CSN: [id number] Arrival date & time: 12/11/19  1255     History Chief Complaint  Patient presents with   Fall   Chest Pain    rt. rib pain    Amy [name] is a 84 y.o. female with past medical history of chronic back pain s/p surgery, stroke presents to the ER from urgent care for abnormal chest x-ray.  Patient had a fall 5 days ago at her house and had chest x-ray at [facility] that showed incidental possible aorta aneurysm or dissection. Patient unsure exactly how she fell but states she fell on her right side.  She reports intermittent, mild to moderate right chest and rib pain.  This is worse with moving, palpation.  Also reports right cheekbone and right wrist pain with associated bruising.  She does not think she hit her head but hit her right face. States she finally went to urgent care because her right chest pain did not improve with time.  She is a retired [employer] scientist.  Patient does not recall events leading up to the fall, states the floor may have been wet but is unsure.  She walks with a walker and son states the walker was beside her when he found her on the ground.  She does not remember having any prodromal lightheadedness, chest pain or any other symptoms prior to the fall.  She has otherwise been feeling at her baseline after the fall.  Patient had a medical alert that she pushed and was on the ground for only a few minutes before her son helped her up.  Denies any other traumatic injuries.  Denies anticoagulant use.  Denies associated headache, vision changes, nausea or vomiting after the event.  Denies any unilateral numbness or weakness.  She has chronic low back pain from surgery that is unchanged. No tearing or sharp chest pain or abdominal pain.     HPI     Past Medical History:  Diagnosis Date   Anxiety    Arthritis    Back pain    Colon polyps    Depression    Diabetes mellitus, type II ([place])     diet controlled   Hypercholesterolemia    Invasive ductal carcinoma of breast, female, right ([place])    Osteopenia 11/26/2016   Skin-picking disorder    has 3 open wounds on right wrist that are being treated. About a quarter in size.   Stroke [facility])     Patient Active Problem List   Diagnosis Date Noted   Cryptogenic stroke ([place]) 10/21/2019   Palpitations 10/21/2019   Cognitive impairment    UTI (urinary tract infection) 10/05/2019   Dehydration    Major depressive disorder, recurrent episode, mild with anxious distress ([place])    Acute lower UTI    Deficiency of vitamin B12    Physical deconditioning    Acute metabolic encephalopathy 0000000   Acute encephalopathy 08/16/2019   Mild renal insufficiency [id number]   Chronic systolic CHF (congestive heart failure) ([place]) 08/16/2019   Cardiac LV ejection fraction 30-35% 03/29/2019   Dyslipidemia associated with type 2 diabetes mellitus ([place]) 03/29/2019   Major depression with psychotic features ([place]) 03/29/2019   History of CVA (cerebrovascular accident) 03/20/2019   Edema of both lower legs due to peripheral venous insufficiency 10/12/2018   Anemia 05/12/2018   S/P total knee replacement, right 05/05/18 05/12/2018   Type 2 diabetes mellitus with neurological complications ([place]) [id number]   Primary  osteoarthritis of right knee    Status post surgery 01/26/2018   Surgery, elective 01/26/2018   Degenerative spondylolisthesis 01/26/2018   Osteoporosis 12/13/2016   Osteopenia 11/26/2016   Invasive ductal carcinoma of breast, female, right ([place])    Lumbago 03/22/2014   Stiffness of joint, not elsewhere classified, pelvic region and thigh 03/22/2014   Muscle weakness (generalized) 03/22/2014   Abnormality of gait 03/22/2014   Depression with anxiety 09/03/2012   Insomnia secondary to depression with anxiety 09/03/2012   Pain 09/03/2012    Past Surgical History:  Procedure Laterality Date    APPENDECTOMY     BACK SURGERY     BREAST BIOPSY Right 10/09/2016   Procedure: BREAST BIOPSY WITH NEEDLE LOCALIZATION;  Surgeon: [name], MD;  Location: [facility] ORS;  Service: General;  Laterality: Right;   BREAST SURGERY Right    BUBBLE STUDY  03/23/2019   Procedure: BUBBLE STUDY;  Surgeon: [name], MD;  Location: [facility] ENDOSCOPY;  Service: Cardiovascular;;   BUNIONECTOMY Bilateral    FACIAL COSMETIC SURGERY     drooping eyelid and brow   HERNIA REPAIR Right    Inguinal x2   implantable loop recorder placement  10/21/2019    Medtronic Reveal [place] model LNQ 11 [id number]) implanted by Dr [name] for cryptogenic stroke indication   KNEE ARTHROSCOPY Right    SHOULDER SURGERY Right    rotator cuff repair and removal of bone spur   TEE WITHOUT CARDIOVERSION N/A 03/23/2019   Procedure: TRANSESOPHAGEAL ECHOCARDIOGRAM (TEE);  Surgeon: [name], MD;  Location: [facility] ENDOSCOPY;  Service: Cardiovascular;  Laterality: N/A;   TEMPOROMANDIBULAR JOINT SURGERY Right    TOTAL KNEE ARTHROPLASTY Right 05/05/2018   Procedure: TOTAL KNEE ARTHROPLASTY;  Surgeon: [name], MD;  Location: [facility] ORS;  Service: Orthopedics;  Laterality: Right;     OB History   No obstetric history on file.     Family History  Problem Relation Age of Onset   Anxiety disorder Mother    Depression Mother    Depression Maternal Grandmother    Alcohol abuse Brother    Cancer - Other Sister    ADD / ADHD Neg Hx    Bipolar disorder Neg Hx    Dementia Neg Hx    Drug abuse Neg Hx    OCD Neg Hx    Paranoid behavior Neg Hx    Schizophrenia Neg Hx    Seizures Neg Hx    Sexual abuse Neg Hx    Physical abuse Neg Hx     Social History   Tobacco Use   Smoking status: Former Smoker    Packs/day: 0.50    Years: 40.00    Pack years: 20.00    Types: Cigarettes    Quit date: 09/04/1995    Years since quitting: 24.2   Smokeless tobacco: Never Used  Substance Use Topics    Alcohol use: No   Drug use: No    Types: Benzodiazepines, Hydrocodone    Home Medications Prior to Admission medications   Medication Sig Start Date End Date Taking? Authorizing Provider  ALPRAZolam Duanne Moron) 0.5 MG tablet Take 1 tablet (0.5 mg total) by mouth at bedtime. 09/01/19   Cloria Spring, MD  aspirin EC 81 MG tablet Take 81 mg by mouth every morning.    [provider]  atorvastatin (LIPITOR) 40 MG tablet Take 1 tablet (40 mg total) by mouth daily at 6 PM. 09/06/19   Frann Rider, NP  calcium carbonate (OSCAL) 1500 (600  Ca) MG TABS tablet Take 600 mg of elemental calcium by mouth daily with supper.     [provider]  cholecalciferol (VITAMIN D) 1000 units tablet Take 1 tablet (1,000 Units total) by mouth daily. 10/23/16   Baird Cancer, PA-C  cyanocobalamin (,VITAMIN B-12,) 1000 MCG/ML injection Daily for 4 days; then weekly for 1 month and then monthly after that. 08/19/19   Barton Dubois, MD  escitalopram (LEXAPRO) 20 MG tablet Take 1 tablet (20 mg total) by mouth daily. 09/01/19   Cloria Spring, MD  gabapentin (NEURONTIN) 300 MG capsule Take 1 capsule (300 mg total) by mouth at bedtime. 09/01/19   Cloria Spring, MD  letrozole [facility]) 2.5 MG tablet TAKE 1 TABLET(2.5 MG) BY MOUTH DAILY 07/02/19   Lockamy, Randi L, NP-C  losartan (COZAAR) 25 MG tablet Take 0.5 tablets (12.5 mg total) by mouth at bedtime. 05/19/19 10/06/19  Shirley Friar, PA-C  metFORMIN (GLUCOPHAGE) 500 MG tablet Take 1 tablet (500 mg total) by mouth 2 (two) times daily with a meal. 04/09/19   Nyoka Cowden, Phylis Bougie, NP  metoprolol succinate (TOPROL XL) 25 MG 24 hr tablet Take 1 tablet (25 mg total) by mouth daily. 09/29/19   Fay Records, MD  risperiDONE (RISPERDAL) 1 MG tablet Take 1 tablet (1 mg total) by mouth at bedtime. 09/01/19   Cloria Spring, MD  traZODone (DESYREL) 50 MG tablet Take 1 tablet (50 mg total) by mouth at bedtime. 11/22/19   Cloria Spring, MD  venlafaxine XR  (EFFEXOR-XR) 150 MG 24 hr capsule Take 1 capsule (150 mg total) by mouth 2 (two) times daily. 09/01/19   Cloria Spring, MD    Allergies    Cymbalta [duloxetine hcl]  Review of Systems   Review of Systems  HENT: Positive for facial swelling.   Cardiovascular: Positive for chest pain (right rib).  Musculoskeletal: Positive for arthralgias and back pain.  All other systems reviewed and are negative.   Physical Exam Updated Vital Signs BP (!) 156/80    Pulse 73    Temp 97.8 F (36.6 C) (Oral)    Resp 18    Ht 5\' 4"  (1.626 m)    Wt 72.6 kg    SpO2 94%    BMI 27.46 kg/m   Physical Exam Vitals and nursing note reviewed.  Constitutional:      General: She is not in acute distress.    Appearance: She is well-developed.     Comments: NAD.  HENT:     Head: Normocephalic.     Comments: No scalp bone tenderness or signs of trauma. Mild ecchymosis with tenderness on right zygomatic bone, no crepitus or obvious deformities.  Midface is stable.    Right Ear: External ear normal.     Left Ear: External ear normal.     Nose: Nose normal.  Eyes:     General: No scleral icterus.    Conjunctiva/sclera: Conjunctivae normal.  Neck:     Comments: No midline or paraspinal cervical spine tenderness.  Full range of motion of neck without pain. Cardiovascular:     Rate and Rhythm: Normal rate and regular rhythm.     Heart sounds: Normal heart sounds. No murmur.     Comments: Trace pretibial edema bilaterally.  1+ radial and DP pulses bilaterally  Pulmonary:     Effort: Pulmonary effort is normal.     Breath sounds: Normal breath sounds. No wheezing.     Comments: Slightly diminished lung  sounds to lower lobes, limited due to body habitus and pain with breathing.  No crackles.  Speaking full sentences.  Normal work of breathing. Chest:     Chest wall: Tenderness present.     Comments: Tenderness along right lateral chest wall, breast and axilla.  No skin abnormalities or ecchymosis.  No crepitus.   Pain reproduced with movement as well. Musculoskeletal:        General: No deformity. Normal range of motion.     Right wrist: Tenderness present.     Cervical back: Normal range of motion and neck supple.     Comments: TL spine: No midline or paraspinal muscle tenderness.  No wheezing on the back. Upper and lower extremity joints palpated, without tenderness or signs of injury. Pelvis: Nontender, full range of motion of hips without pain. Right hand: Focal area of ecchymosis and tenderness along base of the thumb and scaphoid, mild tenderness with thumb range of motion.  Fingers, MCPs and other wrist bones normal and nontender.  Skin:    General: Skin is warm and dry.     Capillary Refill: Capillary refill takes less than 2 seconds.  Neurological:     Mental Status: She is alert and oriented to person, place, and time.     Comments: Alert and oriented to self, place, time and event.  Speech is fluent without dysarthria or dysphasia. Strength 5/5 with hand grip and ankle F/E.   Sensation to light touch intact in face, hands and feet. Sits on side of the bed without truncal sway No pronator drift. No leg drop. Normal finger-to-nose and feet tapping.  CN I not tested CN II-XII grossly normal   Psychiatric:        Behavior: Behavior normal.        Thought Content: Thought content normal.        Judgment: Judgment normal.     ED Results / Procedures / Treatments   Labs (all labs ordered are listed, but only abnormal results are displayed) Labs Reviewed  CBC WITH DIFFERENTIAL/PLATELET - Abnormal; Notable for the following components:      Result Value   RBC 3.62 (*)    Hemoglobin 11.1 (*)    HCT 34.1 (*)    All other components within normal limits  BASIC METABOLIC PANEL - Abnormal; Notable for the following components:   Calcium 8.8 (*)    All other components within normal limits  URINALYSIS, ROUTINE W REFLEX MICROSCOPIC - Abnormal; Notable for the following components:    Ketones, ur 5 (*)    All other components within normal limits    EKG EKG Interpretation  Date/Time:  Saturday Dec 11 2019 16:25:17 EDT Ventricular Rate:  74 PR Interval:    QRS Duration: 84 QT Interval:  432 QTC Calculation: 480 R Axis:   9 Text Interpretation: Sinus rhythm Low voltage, precordial leads No significant change since last tracing Confirmed by Deno Etienne [phone]) on 12/11/2019 4:29:37 PM   Radiology DG Ribs Unilateral W/Chest Right  Addendum Date: 12/11/2019   ADDENDUM REPORT: 12/11/2019 11:45 ADDENDUM: These results were called by telephone at the time of interpretation on 12/11/2019 at 11:45 am to provider [facility] , who verbally acknowledged these results. Electronically Signed   By: Zetta Bills M.D.   On: 12/11/2019 11:45   Result Date: 12/11/2019 CLINICAL DATA:  Fall on Wednesday with pain RIGHT rib pain. EXAM: RIGHT RIBS AND CHEST - 3+ VIEW COMPARISON:  10/05/2019 FINDINGS: Loop recorder projects over the  LEFT cardiac border. Cardiomediastinal contours with some tortuosity of the descending thoracic aorta. The descending thoracic aortic contour appears different though this could be related to projection. Heart size is stable. Lungs are clear. No pleural effusion. Signs of spinal fusion in the thoracolumbar spine with similar appearance. No displaced rib fracture is noted. IMPRESSION: 1. No displaced rib fracture. No pneumothorax. 2. Accentuation of the descending thoracic aortic contour, potentially related to projection but this appears to be markedly different than on prior imaging evaluations. Consider CT of the chest to exclude an acute aortic process or further aneurysmal dilation of the thoracic aorta. There are no secondary signs of acute process. 3. Signs of spinal fusion. 4. A call is out to the referring provider to further discuss findings in the above case. Electronically Signed: By: Zetta Bills M.D. On: 12/11/2019 11:34   CT Head Wo  Contrast  Result Date: 12/11/2019 CLINICAL DATA:  Unwitnessed fall several days ago. EXAM: CT HEAD WITHOUT CONTRAST TECHNIQUE: Contiguous axial images were obtained from the base of the skull through the vertex without intravenous contrast. COMPARISON:  Head CT 10/05/2019 FINDINGS: Brain: No evidence of acute infarction, hemorrhage, hydrocephalus, extra-axial collection or mass lesion/mass effect. Stable degree of atrophy. Moderate to extensive chronic small vessel ischemia which is unchanged from prior exam. Slight rightward shift is chronic and unchanged from prior imaging. Vascular: Atherosclerosis of skullbase vasculature without hyperdense vessel or abnormal calcification. Skull: No fracture or focal lesion. Sinuses/Orbits: Assessed on concurrent face CT, reported separately. Other: None. IMPRESSION: 1. No acute intracranial abnormality. No skull fracture. 2. Stable atrophy and chronic small vessel ischemia. Electronically Signed   By: Keith Rake M.D.   On: 12/11/2019 19:03   CT Angio Chest/Abd/Pel for Dissection W and/or Wo Contrast  Result Date: 12/11/2019 CLINICAL DATA:  Dilation of the aorta. Suggested on chest x-ray EXAM: CT ANGIOGRAPHY CHEST, ABDOMEN AND PELVIS TECHNIQUE: Non-contrast CT of the chest was initially obtained. Multidetector CT imaging through the chest, abdomen and pelvis was performed using the standard protocol during bolus administration of intravenous contrast. Multiplanar reconstructed images and MIPs were obtained and reviewed to evaluate the vascular anatomy. CONTRAST:  158mL OMNIPAQUE IOHEXOL 350 MG/ML SOLN COMPARISON:  Multiple prior imaging studies dating back to August of 2020 FINDINGS: CTA CHEST FINDINGS Cardiovascular: No pericardial effusion. No sign of intramural hematoma. Dilation of the descending thoracic aorta to 4 x 3.9 cm. This is more dilated than in 2020 where it measured approximately 3.9 by 3.6 cm. No periaortic stranding. No effusion. Signs of ulcerated  plaque or areas of focal penetrating atherosclerotic ulcer on image 113 of series 11 and image 100 of series 11. w Ascending aortic caliber 3.1 cm. Mild dilation of central pulmonary vasculature which is otherwise unremarkable. In the descending thoracic aorta are similar to the prior exam from October of 2020 and March 05, 2019 Mediastinum/Nodes: No mediastinal hematoma. Thoracic inlet structures are unremarkable. No axillary adenopathy. Lungs/Pleura: No consolidation. No pleural effusion. No pneumothorax. Airways are patent. Musculoskeletal: No displaced rib fracture. No chest wall mass. Sternum is intact. Review of the MIP images confirms the above findings. CTA ABDOMEN AND PELVIS FINDINGS VASCULAR Aorta: Patent without signs of dissection, vasculitis or aneurysmal dilation in the abdomen. Calcified and noncalcified plaque in the abdominal aorta. Celiac: Celiac is patent with classic vascular anatomy. SMA: SMA is patent without signs of narrowing or significant atheromatous plaque. Renals: Renal arteries, single renal arteries bilaterally are patent with atheromatous plaque at the origin of the  renal arteries bilaterally greatest on the LEFT. Vessels remain patent. IMA: IMA is patent. Inflow: Patent with dilated thoracic aorta along descending thoracic aorta as described. Outflow: Patent outflow into the upper thigh with proximal portions of superficial femoral and profundus femoral artery visualized. Review of the MIP images confirms the above findings. NON-VASCULAR Hepatobiliary: Liver unremarkable. Limited assessment on arterial phase. Normal appearance of the gallbladder. No biliary ductal dilation. Pancreas: Pancreas normal without signs of peripancreatic inflammation. No ductal dilation. Spleen: Spleen normal size without focal lesion. Adrenals/Urinary Tract: Adrenal glands are normal. Renal cortical scarring bilaterally. Stomach/Bowel: Small hiatal hernia. Extensive colonic diverticulosis. No sign of  diverticulitis. Lymphatic: Adenopathy in the lower retroperitoneum and in the pelvis, increased when compared to the prior study. Bulky lymph nodes in the pelvis, largest on the LEFT measuring 2 x 3.9 cm previously approximately 1.7 cm short axis. LEFT pelvic sidewall lymph node 1.7 cm short axis, previously 1.1 cm. Another LEFT external iliac lymph node (image 265, series 11) 1.7 cm previously approximately 1.5 cm. Increasing size of retroperitoneal lymph nodes retrocaval lymph node (image 200, series 11) 1 cm short axis previously 9 mm. LEFT periaortic lymph nodes largest 11 mm short axis (image 185, series 11) previously approximately 10 mm. Presacral lymph nodes (image 24, series 11) 1 cm short axis previously approximately 8 mm. Reproductive: Uterus remains in place. No adnexal mass. Other: No abdominal wall hernia. Musculoskeletal: Signs of spinal fusion and degenerative change in the spine. No acute bone finding. No destructive bone process. Anterolisthesis of L4 on L5 is similar to the previous exam. Review of the MIP images confirms the above findings. IMPRESSION: 1. Dilation of the descending thoracic aorta to 4 x 3.9 cm, more dilated than in 2020 where it measured approximately 3.9 by 3.6 cm. No periaortic stranding. Findings are indicative of slight interval enlargement of descending thoracic aortic aneurysm with fusiform slightly eccentric dilation favoring the posterior wall, associated with areas of focal penetrating atherosclerotic ulceration. 2. Accounting for differences in technique areas of atherosclerotic ulceration are similar to the prior study. If there is any chest pain or other symptoms that would suggest acute aortic pathology these areas should be considered. No findings to suggest acute pathology at this time. 3. Nondisplaced fracture of the RIGHT anterior seventh rib. 4. Worsening of adenopathy in the lower retroperitoneum and pelvis, suspicious for metastatic disease, distribution  would favor a pelvic primary. 5. Extensive colonic diverticulosis without evidence of diverticulitis. 6. Aortic atherosclerosis. Aortic Atherosclerosis (ICD10-I70.0). Electronically Signed   By: Zetta Bills M.D.   On: 12/11/2019 19:42   CT Maxillofacial Wo Contrast  Result Date: 12/11/2019 CLINICAL DATA:  Unwitnessed fall with right zygomatic tenderness. Bruising to right side of face. EXAM: CT MAXILLOFACIAL WITHOUT CONTRAST TECHNIQUE: Multidetector CT imaging of the maxillofacial structures was performed. Multiplanar CT image reconstructions were also generated. COMPARISON:  No prior face CT. FINDINGS: Osseous: Zygomatic arches, nasal bone, and mandibles are intact. The temporomandibular joints are congruent with degenerative change. Streak artifact from dental hardware partially obscures detailed tooth evaluation. Nasal septum is midline. Orbits: No acute orbital fracture. Both orbits and globes are intact. Sinuses: No sinus fracture or fluid level. Mastoid air cells are clear. Soft tissues: Soft tissue edema overlies the right face. Limited intracranial: Assessed on concurrent head CT, reported separately. Other: Stable oversew of normal cervical lordosis and anterolisthesis of C3 on C4 compared with 03/21/2019 neck CT. IMPRESSION: Soft tissue edema overlies the right face. No facial bone fracture. Electronically Signed  By: Keith Rake M.D.   On: 12/11/2019 19:07    Procedures Ultrasound ED Peripheral IV (Provider)  Date/Time: 12/11/2019 6:00 PM Performed by: Kinnie Feil, PA-C Authorized by: Kinnie Feil, PA-C   Procedure details:    Indications: multiple failed IV attempts and poor IV access     Skin Prep: chlorhexidine gluconate     Location:  Left AC   Angiocath:  20 G   Bedside Ultrasound Guided: Yes     Images: archived     Patient tolerated procedure without complications: Yes     Dressing applied: Yes   Comments:     Flushed with 10 cc NS without infiltration  or pain, blood drawn   (including critical care time)  Medications Ordered in ED Medications  oxyCODONE-acetaminophen (PERCOCET/ROXICET) 5-325 MG per tablet 1 tablet (1 tablet Oral Given 12/11/19 1901)  iohexol (OMNIPAQUE) 350 MG/ML injection 100 mL (100 mLs Intravenous Contrast Given 12/11/19 1847)    ED Course  I have reviewed the triage vital signs and the nursing notes.  Pertinent labs & imaging results that were available during my care of the patient were reviewed by me and considered in my medical decision making (see chart for details).  Clinical Course as of Dec 10 2045  Sat Dec 11, 2019  1953 Hemoglobin(!): 11.1 [CG]  1953 HCT(!): 34.1 [CG]  1953 Ketones, ur(!): 5 [CG]  1953 BP: 106/63 [CG]  1953 BP(!): 154/71 [CG]  1953 Soft tissue edema overlies the right face. No facial bone fracture.  CT Maxillofacial Wo Contrast [CG]  1953 IMPRESSION: 1. No acute intracranial abnormality. No skull fracture. 2. Stable atrophy and chronic small vessel ischemia.    CT Head Wo Contrast [CG]  1955 IMPRESSION: 1. Dilation of the descending thoracic aorta to 4 x 3.9 cm, more dilated than in 2020 where it measured approximately 3.9 by 3.6 cm. No periaortic stranding. Findings are indicative of slight interval enlargement of descending thoracic aortic aneurysm with fusiform slightly eccentric dilation favoring the posterior wall, associated with areas of focal penetrating atherosclerotic ulceration. 2. Accounting for differences in technique areas of atherosclerotic ulceration are similar to the prior study. If there is any chest pain or other symptoms that would suggest acute aortic pathology these areas should be considered. No findings to suggest acute pathology at this time. 3. Nondisplaced fracture of the RIGHT anterior seventh rib. 4. Worsening of adenopathy in the lower retroperitoneum and pelvis, suspicious for metastatic disease, distribution would favor a pelvic primary. 5.  Extensive colonic diverticulosis without evidence of diverticulitis. 6. Aortic atherosclerosis.     CT Angio Chest/Abd/Pel for Dissection W and/or Wo Contrast [CG]    Clinical Course User Index [CG] Kinnie Feil, PA-C   MDM Rules/Calculators/A&P                      Patient is EMR reviewed to assist with MDM.  Urgent care visit reviewed including chest x-ray.  84 year old female presents with right facial pain and bruising, right lateral rib pain and right wrist pain after an unwitnessed fall 5 days ago.  She does not remember the events leading up to the fall but reports feeling at her baseline prior and after the fall.  Obtained corroborating history from son at bedside who states patient has been acting at her baseline.  No anticoagulants.  Given age, unwitnessed fall low threshold to obtain screening labs and EKG.  Chest x-ray at urgent care shows possible abnormalities with  the order, will obtain CT to further evaluate.  Patient has no focal neuro deficits, headache or signs of scalp trauma.  She has no tearing or new chest pain or abdominal or back pain.  2040: ER work up personally reviewed and interpreted.  Screening labs non acute. UA without infection.  CT confirms single right fracture and slight change in diameter of aorta from previous but nothing acute.  Discussed adenopathy incidentally found on CT with son an recommended fu with PCP for further discussion of this given concern for possible pelvic malignancy.  Recommended yearly monitoring of aorta. Son advised me pt has had previous opioid addiction and requested no opioid prescription.  Will dc with tylenol, lidocaine patch, IS. F/u with PCP in 7-10 days. Return precautions discsussed. Son in agreement. Shared with EDP.  Final Clinical Impression(s) / ED Diagnoses Final diagnoses:  Fall, initial encounter  Closed fracture of one rib of right side, initial encounter  Adenopathy    Rx / DC Orders ED Discharge  Orders    None       Arlean Hopping 12/11/19 2047    Deno Etienne, DO 12/11/19 2102

## 2019-12-11 NOTE — Discharge Instructions (Signed)
You were seen in the ER after a fall  CT confirms right 7th fracture but no other traumatic injuries  CT confirms dilated aorta.  This is not new but slightly more than previous.  Please talk to your primary care doctor about this as you may need yearly screenings to ensure this has not enlarged to a critical level  CT showed non specific enlarged lymph nodes in your abdomen and pelvis.  We recommend discussion with your primary care doctor about this.  This could be a non specific finding but could also be seen when there is cancer or malignancy  Take [phone] mg acetaminophen every 6 hours for pain.  Apply lidocaine patch to the area.  Use your incentive spirometry at least every 4 hours during the day to take deep breaths.   Return for worsening chest pain, shortness of breath, fever, cough

## 2019-12-11 NOTE — ED Provider Notes (Signed)
[place]   [id number] 12/11/19 Arrival Time: [id number]  CC: Rib pain  SUBJECTIVE: History from: patient. Amy [name] is a 84 y.o. female complains of RT rib pain x 3 days.  Trip and fall and landed on RT side of ribs.  Localizes the pain to the RT ribs.  Describes the pain as constant and achy in character.  Has tried OTC medications without relief.  Symptoms are made worse with deep breath.  Denies similar symptoms in the past.  Denies fever, chills, erythema, ecchymosis, effusion, weakness, chest pain, SOB, LOC, syncope.    ROS: As per HPI.  All other pertinent ROS negative.     Past Medical History:  Diagnosis Date  . Anxiety   . Arthritis   . Back pain   . Colon polyps   . Depression   . Diabetes mellitus, type II ([place])    diet controlled  . Hypercholesterolemia   . Invasive ductal carcinoma of breast, female, right ([place])   . Osteopenia 11/26/2016  . Skin-picking disorder    has 3 open wounds on right wrist that are being treated. About a quarter in size.  . Stroke [facility])    Past Surgical History:  Procedure Laterality Date  . APPENDECTOMY    . BACK SURGERY    . BREAST BIOPSY Right 10/09/2016   Procedure: BREAST BIOPSY WITH NEEDLE LOCALIZATION;  Surgeon: [name], MD;  Location: [facility] ORS;  Service: General;  Laterality: Right;  . BREAST SURGERY Right   . BUBBLE STUDY  03/23/2019   Procedure: BUBBLE STUDY;  Surgeon: [name], MD;  Location: [facility] ENDOSCOPY;  Service: Cardiovascular;;  . BUNIONECTOMY Bilateral   . FACIAL COSMETIC SURGERY     drooping eyelid and brow  . HERNIA REPAIR Right    Inguinal x2  . implantable loop recorder placement  10/21/2019    Medtronic Reveal [place] model [id number]) implanted by Dr [name] for cryptogenic stroke indication  . KNEE ARTHROSCOPY Right   . SHOULDER SURGERY Right    rotator cuff repair and removal of bone spur  . TEE WITHOUT CARDIOVERSION N/A 03/23/2019   Procedure: TRANSESOPHAGEAL ECHOCARDIOGRAM  (TEE);  Surgeon: [name], MD;  Location: [facility] ENDOSCOPY;  Service: Cardiovascular;  Laterality: N/A;  . TEMPOROMANDIBULAR JOINT SURGERY Right   . TOTAL KNEE ARTHROPLASTY Right 05/05/2018   Procedure: TOTAL KNEE ARTHROPLASTY;  Surgeon: [name], MD;  Location: [facility] ORS;  Service: Orthopedics;  Laterality: Right;   Allergies  Allergen Reactions  . Cymbalta [Duloxetine Hcl] Other (See Comments)    Hallucinated and couldn't think; got worse and worse the longer she took this.   No current facility-administered medications on file prior to encounter.   Current Outpatient Medications on File Prior to Encounter  Medication Sig Dispense Refill  . ALPRAZolam (XANAX) 0.5 MG tablet Take 1 tablet (0.5 mg total) by mouth at bedtime. 30 tablet 2  . aspirin EC 81 MG tablet Take 81 mg by mouth every morning.    Marland Kitchen atorvastatin (LIPITOR) 40 MG tablet Take 1 tablet (40 mg total) by mouth daily at 6 PM. 90 tablet 2  . calcium carbonate (OSCAL) 1500 (600 Ca) MG TABS tablet Take 600 mg of elemental calcium by mouth daily with supper.     . cholecalciferol (VITAMIN D) 1000 units tablet Take 1 tablet (1,000 Units total) by mouth daily. 30 tablet 11  . cyanocobalamin (,VITAMIN B-12,) 1000 MCG/ML injection Daily for 4 days; then weekly for 1 month  and then monthly after that. 12 mL 0  . escitalopram (LEXAPRO) 20 MG tablet Take 1 tablet (20 mg total) by mouth daily. 30 tablet 2  . gabapentin (NEURONTIN) 300 MG capsule Take 1 capsule (300 mg total) by mouth at bedtime. 30 capsule 2  . letrozole (FEMARA) 2.5 MG tablet TAKE 1 TABLET(2.5 MG) BY MOUTH DAILY 90 tablet 3  . losartan (COZAAR) 25 MG tablet Take 0.5 tablets (12.5 mg total) by mouth at bedtime. 45 tablet 3  . metFORMIN (GLUCOPHAGE) 500 MG tablet Take 1 tablet (500 mg total) by mouth 2 (two) times daily with a meal. 60 tablet 0  . metoprolol succinate (TOPROL XL) 25 MG 24 hr tablet Take 1 tablet (25 mg total) by mouth daily. 30 tablet 11  .  risperiDONE (RISPERDAL) 1 MG tablet Take 1 tablet (1 mg total) by mouth at bedtime. 30 tablet 2  . traZODone (DESYREL) 50 MG tablet Take 1 tablet (50 mg total) by mouth at bedtime. 30 tablet 2  . venlafaxine XR (EFFEXOR-XR) 150 MG 24 hr capsule Take 1 capsule (150 mg total) by mouth 2 (two) times daily. 60 capsule 2   Social History   Socioeconomic History  . Marital status: Divorced    Spouse name: Not on file  . Number of children: Not on file  . Years of education: Not on file  . Highest education level: Not on file  Occupational History  . Occupation: Nurse    Comment: Retired  Tobacco Use  . Smoking status: Former Smoker    Packs/day: 0.50    Years: 40.00    Pack years: 20.00    Types: Cigarettes    Quit date: 09/04/1995    Years since quitting: 24.2  . Smokeless tobacco: Never Used  Substance and Sexual Activity  . Alcohol use: No  . Drug use: No    Types: Benzodiazepines, Hydrocodone  . Sexual activity: Never  Other Topics Concern  . Not on file  Social History Narrative   Divorced.  Former smoker, quit in 1997.  Nondrinker.    Social Determinants of Health   Financial Resource Strain:   . Difficulty of Paying Living Expenses:   Food Insecurity:   . Worried About Charity fundraiser in the Last Year:   . Arboriculturist in the Last Year:   Transportation Needs:   . Film/video editor (Medical):   Marland Kitchen Lack of Transportation (Non-Medical):   Physical Activity:   . Days of Exercise per Week:   . Minutes of Exercise per Session:   Stress:   . Feeling of Stress :   Social Connections:   . Frequency of Communication with Friends and Family:   . Frequency of Social Gatherings with Friends and Family:   . Attends Religious Services:   . Active Member of Clubs or Organizations:   . Attends Archivist Meetings:   Marland Kitchen Marital Status:   Intimate Partner Violence:   . Fear of Current or Ex-Partner:   . Emotionally Abused:   Marland Kitchen Physically Abused:   .  Sexually Abused:    Family History  Problem Relation Age of Onset  . Anxiety disorder Mother   . Depression Mother   . Depression Maternal Grandmother   . Alcohol abuse Brother   . Cancer - Other Sister   . ADD / ADHD Neg Hx   . Bipolar disorder Neg Hx   . Dementia Neg Hx   . Drug abuse Neg Hx   .  OCD Neg Hx   . Paranoid behavior Neg Hx   . Schizophrenia Neg Hx   . Seizures Neg Hx   . Sexual abuse Neg Hx   . Physical abuse Neg Hx     OBJECTIVE:  Vitals:   12/11/19 1050  BP: 132/84  Pulse: 83  Resp: 20  Temp: 98.4 F (36.9 C)  SpO2: 96%    General appearance: ALERT; in no acute distress.  Head: NCAT Lungs: Normal respiratory effort; CTAB CV: difficult to assess due to body habitus Musculoskeletal: Chest wall Inspection: Skin warm, dry, clear and intact without obvious erythema, effusion, or ecchymosis.  Palpation: Significantly TTP over RT side of ribs ROM: FROM active and passive Strength: deferred Skin: warm and dry Neurologic: Ambulates with walker Psychological: alert and cooperative; normal mood and affect  DIAGNOSTIC STUDIES:  DG Ribs Unilateral W/Chest Right  Addendum Date: 12/11/2019   ADDENDUM REPORT: 12/11/2019 11:45 ADDENDUM: These results were called by telephone at the time of interpretation on 12/11/2019 at 11:45 am to provider [facility] , who verbally acknowledged these results. Electronically Signed   By: Zetta Bills M.D.   On: 12/11/2019 11:45   Result Date: 12/11/2019 CLINICAL DATA:  Fall on Wednesday with pain RIGHT rib pain. EXAM: RIGHT RIBS AND CHEST - 3+ VIEW COMPARISON:  10/05/2019 FINDINGS: Loop recorder projects over the LEFT cardiac border. Cardiomediastinal contours with some tortuosity of the descending thoracic aorta. The descending thoracic aortic contour appears different though this could be related to projection. Heart size is stable. Lungs are clear. No pleural effusion. Signs of spinal fusion in the thoracolumbar spine with  similar appearance. No displaced rib fracture is noted. IMPRESSION: 1. No displaced rib fracture. No pneumothorax. 2. Accentuation of the descending thoracic aortic contour, potentially related to projection but this appears to be markedly different than on prior imaging evaluations. Consider CT of the chest to exclude an acute aortic process or further aneurysmal dilation of the thoracic aorta. There are no secondary signs of acute process. 3. Signs of spinal fusion. 4. A call is out to the referring provider to further discuss findings in the above case. Electronically Signed: By: Zetta Bills M.D. On: 12/11/2019 11:34     ASSESSMENT & PLAN:  1. Rib pain on right side   2. Abnormal chest x-ray     X-rays did not show fracture or dislocation.  However, x-rays were concerning for accentuation of the descending thoracic aortic contour, which could be the vessel dissecting.  Radiologist is recommending further evaluation and management in the ED for CT scan.  Patient aware and in agreement.      Lestine Box, PA-C 12/11/19 1224

## 2019-12-28 ENCOUNTER — Other Ambulatory Visit (HOSPITAL_COMMUNITY): Payer: Self-pay | Admitting: Psychiatry

## 2019-12-28 MED ORDER — ESCITALOPRAM OXALATE 20 MG PO TABS: 20.0000 mg | ORAL_TABLET | Freq: Every day | ORAL | 2 refills | Status: DC

## 2019-12-30 ENCOUNTER — Inpatient Hospital Stay (HOSPITAL_COMMUNITY): Payer: Medicare HMO | Attending: Hematology

## 2019-12-30 ENCOUNTER — Ambulatory Visit (INDEPENDENT_AMBULATORY_CARE_PROVIDER_SITE_OTHER): Payer: Medicare HMO | Admitting: *Deleted

## 2019-12-30 ENCOUNTER — Other Ambulatory Visit (HOSPITAL_COMMUNITY): Payer: Self-pay | Admitting: Psychiatry

## 2019-12-30 DIAGNOSIS — Z79811 Long term (current) use of aromatase inhibitors: Secondary | ICD-10-CM | POA: Insufficient documentation

## 2019-12-30 DIAGNOSIS — R590 Localized enlarged lymph nodes: Secondary | ICD-10-CM | POA: Insufficient documentation

## 2019-12-30 DIAGNOSIS — Z7982 Long term (current) use of aspirin: Secondary | ICD-10-CM | POA: Diagnosis not present

## 2019-12-30 DIAGNOSIS — M129 Arthropathy, unspecified: Secondary | ICD-10-CM | POA: Diagnosis not present

## 2019-12-30 DIAGNOSIS — Z87891 Personal history of nicotine dependence: Secondary | ICD-10-CM | POA: Diagnosis not present

## 2019-12-30 DIAGNOSIS — Z170 Estrogen receptor positive status [ER+]: Secondary | ICD-10-CM | POA: Insufficient documentation

## 2019-12-30 DIAGNOSIS — C50911 Malignant neoplasm of unspecified site of right female breast: Secondary | ICD-10-CM | POA: Diagnosis present

## 2019-12-30 DIAGNOSIS — R5383 Other fatigue: Secondary | ICD-10-CM | POA: Diagnosis not present

## 2019-12-30 DIAGNOSIS — F419 Anxiety disorder, unspecified: Secondary | ICD-10-CM | POA: Diagnosis not present

## 2019-12-30 DIAGNOSIS — Z79899 Other long term (current) drug therapy: Secondary | ICD-10-CM | POA: Diagnosis not present

## 2019-12-30 DIAGNOSIS — M8580 Other specified disorders of bone density and structure, unspecified site: Secondary | ICD-10-CM | POA: Diagnosis not present

## 2019-12-30 DIAGNOSIS — K4090 Unilateral inguinal hernia, without obstruction or gangrene, not specified as recurrent: Secondary | ICD-10-CM | POA: Insufficient documentation

## 2019-12-30 DIAGNOSIS — I639 Cerebral infarction, unspecified: Secondary | ICD-10-CM

## 2019-12-30 DIAGNOSIS — I700 Atherosclerosis of aorta: Secondary | ICD-10-CM | POA: Diagnosis not present

## 2019-12-30 DIAGNOSIS — R079 Chest pain, unspecified: Secondary | ICD-10-CM | POA: Insufficient documentation

## 2019-12-30 DIAGNOSIS — M4046 Postural lordosis, lumbar region: Secondary | ICD-10-CM | POA: Insufficient documentation

## 2019-12-30 DIAGNOSIS — Z7984 Long term (current) use of oral hypoglycemic drugs: Secondary | ICD-10-CM | POA: Diagnosis not present

## 2019-12-30 DIAGNOSIS — E119 Type 2 diabetes mellitus without complications: Secondary | ICD-10-CM | POA: Diagnosis not present

## 2019-12-30 DIAGNOSIS — Z8673 Personal history of transient ischemic attack (TIA), and cerebral infarction without residual deficits: Secondary | ICD-10-CM | POA: Insufficient documentation

## 2019-12-30 DIAGNOSIS — E7800 Pure hypercholesterolemia, unspecified: Secondary | ICD-10-CM | POA: Diagnosis not present

## 2019-12-30 DIAGNOSIS — F329 Major depressive disorder, single episode, unspecified: Secondary | ICD-10-CM | POA: Insufficient documentation

## 2019-12-30 LAB — CBC WITH DIFFERENTIAL/PLATELET
Abs Immature Granulocytes: 0.03 10*3/uL (ref 0.00–0.07)
Basophils Absolute: 0 10*3/uL (ref 0.0–0.1)
Basophils Relative: 0 %
Eosinophils Absolute: 0.2 10*3/uL (ref 0.0–0.5)
Eosinophils Relative: 2 %
HCT: 39.7 % (ref 36.0–46.0)
Hemoglobin: 12.9 g/dL (ref 12.0–15.0)
Immature Granulocytes: 0 %
Lymphocytes Relative: 14 %
Lymphs Abs: 1 10*3/uL (ref 0.7–4.0)
MCH: 30.4 pg (ref 26.0–34.0)
MCHC: 32.5 g/dL (ref 30.0–36.0)
MCV: 93.4 fL (ref 80.0–100.0)
Monocytes Absolute: 0.4 10*3/uL (ref 0.1–1.0)
Monocytes Relative: 5 %
Neutro Abs: 5.4 10*3/uL (ref 1.7–7.7)
Neutrophils Relative %: 79 %
Platelets: 234 10*3/uL (ref 150–400)
RBC: 4.25 MIL/uL (ref 3.87–5.11)
RDW: 12.2 % (ref 11.5–15.5)
WBC: 7 10*3/uL (ref 4.0–10.5)
nRBC: 0 % (ref 0.0–0.2)

## 2019-12-30 LAB — CUP PACEART REMOTE DEVICE CHECK
Date Time Interrogation Session: 20210602233337
Implantable Pulse Generator Implant Date: 20210325

## 2019-12-30 LAB — COMPREHENSIVE METABOLIC PANEL
ALT: 15 U/L (ref 0–44)
AST: 23 U/L (ref 15–41)
Albumin: 4 g/dL (ref 3.5–5.0)
Alkaline Phosphatase: 76 U/L (ref 38–126)
Anion gap: 13 (ref 5–15)
BUN: 19 mg/dL (ref 8–23)
CO2: 27 mmol/L (ref 22–32)
Calcium: 9.3 mg/dL (ref 8.9–10.3)
Chloride: 97 mmol/L — ABNORMAL LOW (ref 98–111)
Creatinine, Ser: 0.84 mg/dL (ref 0.44–1.00)
GFR calc Af Amer: >60 mL/min (ref >60)
GFR calc non Af Amer: >60 mL/min (ref >60)
Glucose, Bld: 173 mg/dL — ABNORMAL HIGH (ref 70–99)
Potassium: 4 mmol/L (ref 3.5–5.1)
Sodium: 137 mmol/L (ref 135–145)
Total Bilirubin: 0.5 mg/dL (ref 0.3–1.2)
Total Protein: 6.5 g/dL (ref 6.5–8.1)

## 2019-12-30 LAB — VITAMIN B12: Vitamin B-12: 447 pg/mL (ref 180–914)

## 2019-12-30 LAB — LACTATE DEHYDROGENASE: LDH: 171 U/L (ref 98–192)

## 2019-12-30 LAB — VITAMIN D 25 HYDROXY (VIT D DEFICIENCY, FRACTURES): Vit D, 25-Hydroxy: 72.72 ng/mL (ref 30–100)

## 2020-01-03 ENCOUNTER — Other Ambulatory Visit (HOSPITAL_COMMUNITY): Payer: Self-pay | Admitting: Adult Health

## 2020-01-03 DIAGNOSIS — Z780 Asymptomatic menopausal state: Secondary | ICD-10-CM

## 2020-01-04 ENCOUNTER — Inpatient Hospital Stay (HOSPITAL_COMMUNITY): Payer: Medicare HMO

## 2020-01-04 ENCOUNTER — Other Ambulatory Visit: Payer: Self-pay

## 2020-01-04 ENCOUNTER — Inpatient Hospital Stay (HOSPITAL_COMMUNITY): Payer: Medicare HMO | Admitting: Hematology

## 2020-01-04 VITALS — BP 125/59 | HR 76 | Temp 97.1°F | Resp 18 | Wt 160.6 lb

## 2020-01-04 DIAGNOSIS — R591 Generalized enlarged lymph nodes: Secondary | ICD-10-CM | POA: Diagnosis not present

## 2020-01-04 DIAGNOSIS — C50911 Malignant neoplasm of unspecified site of right female breast: Secondary | ICD-10-CM | POA: Diagnosis not present

## 2020-01-04 DIAGNOSIS — M818 Other osteoporosis without current pathological fracture: Secondary | ICD-10-CM

## 2020-01-04 MED ORDER — DENOSUMAB 60 MG/ML ~~LOC~~ SOSY
60.0000 mg | PREFILLED_SYRINGE | Freq: Once | SUBCUTANEOUS | Status: AC
  Administered 2020-01-04: 60 mg via SUBCUTANEOUS
  Filled 2020-01-04: qty 1

## 2020-01-04 NOTE — Progress Notes (Signed)
[facility] Summary Report / Loop Recorder 

## 2020-01-04 NOTE — Progress Notes (Signed)
[place], [place] [address]   CLINIC:  Medical Oncology/Hematology  PCP:  [name], MD [address] Cathie Beams [place] [address] [phone]   REASON FOR VISIT:  Follow-up for invasive ductal carcinoma of right breast  CURRENT THERAPY: Prolia  BRIEF ONCOLOGIC HISTORY:  Oncology History  Invasive ductal carcinoma of breast, female, right ([place])  10/09/2016 Procedure   Left breast lumpectomy by Dr. Arnoldo Morale   10/16/2016 Pathology Results   Invasive, grade 1, ductal carcinoma, spanning 0.18 cmIn greatest dimension.  Low and intermediate grade DCIS with associated calcifications.  Intraductal papilloma formation with associated low-grade DCIS.  DCIS is less than 0.1 cm from the margin in multiple areas including intermediate ductal carcinoma in situ adjacent to cauterized edge.    10/16/2016 Pathology Results   ER +100%, PR +90%, HER-2 negative.   10/23/2016 Cancer Staging   Cancer Staging Invasive ductal carcinoma of breast, female, right [facility]) Staging form: Breast, AJCC 8th Edition - Pathologic stage from 10/23/2016: Stage Unknown (pT1a, pNX, cM0, G1, ER: Positive, PR: Positive, HER2: Negative) - Signed by Baird Cancer, PA-C on 10/23/2016    11/14/2016 Imaging   CT CAP-  1. No compelling findings of metastatic breast cancer. 2. Acute mild diverticulitis of the descending colon. 3. Further distally in the descending colon but there is a cluster of small lymph nodes adjacent to the colon. Although possibly related to the diverticulitis this is a bit distend and this type of clustered nodes is unusual in this location. Following resolution of the acute process, consider colonoscopy to exclude an underlying occult cancer. 4. 3.7 cm fatty mass along the lumen of the descending colon favors a lipoma or fatty polyp. 5. Aortic Atherosclerosis (ICD10-I70.0). 6. Minimal tree-in-bud reticulonodular opacities in the left lower lobe  characteristic for atypical infectious bronchiolitis. 7. Left groin hernia containing adipose tissue, probably a direct inguinal hernia. 8. Exaggerated lumbar lordosis with grade 1 degenerative anterolisthesis at L4-5.   11/26/2016 Imaging   Bone density- BMD as determined from Femur Neck Left is 0.820 g/cm2 with a T-Score of -1.6.  This patient is considered OSTEOPENIC according to [facility]) criteria.     CANCER STAGING: Cancer Staging Invasive ductal carcinoma of breast, female, right [facility]) Staging form: Breast, AJCC 8th Edition - Pathologic stage from 10/23/2016: Stage Unknown (pT1a, pNX, cM0, G1, ER: Positive, PR: Positive, HER2: Negative) - Signed by Baird Cancer, PA-C on 10/23/2016   INTERVAL HISTORY:  [name], a 84 y.o. female, returns for routine follow-up of her invasive ductal carcinoma of right breast. Amy [name] was last seen on 06/09/2018.   She reports doing well, except for a fractured rib after falling. She ambulates with a walker due to back surgery. She continues taking letrozole. She denies having any new pains.   REVIEW OF SYSTEMS:  Review of Systems  Constitutional: Positive for fatigue (moderate). Negative for appetite change.  Cardiovascular: Positive for chest pain (3/10 R rib pain).  All other systems reviewed and are negative.   PAST MEDICAL/SURGICAL HISTORY:  Past Medical History:  Diagnosis Date   Anxiety    Arthritis    Back pain    Colon polyps    Depression    Diabetes mellitus, type II ([place])    diet controlled   Hypercholesterolemia    Invasive ductal carcinoma of breast, female, right ([place])    Osteopenia 11/26/2016   Skin-picking disorder    has  3 open wounds on right wrist that are being treated. About a quarter in size.   Stroke [facility])    Past Surgical History:  Procedure Laterality Date   APPENDECTOMY     BACK SURGERY     BREAST BIOPSY Right 10/09/2016   Procedure: BREAST BIOPSY WITH  NEEDLE LOCALIZATION;  Surgeon: [name], MD;  Location: [facility] ORS;  Service: General;  Laterality: Right;   BREAST SURGERY Right    BUBBLE STUDY  03/23/2019   Procedure: BUBBLE STUDY;  Surgeon: [name], MD;  Location: [facility] ENDOSCOPY;  Service: Cardiovascular;;   BUNIONECTOMY Bilateral    FACIAL COSMETIC SURGERY     drooping eyelid and brow   HERNIA REPAIR Right    Inguinal x2   implantable loop recorder placement  10/21/2019    Medtronic Reveal Linq model LNQ 11 ([id number]) implanted by Dr [name] for cryptogenic stroke indication   KNEE ARTHROSCOPY Right    SHOULDER SURGERY Right    rotator cuff repair and removal of bone spur   TEE WITHOUT CARDIOVERSION N/A 03/23/2019   Procedure: TRANSESOPHAGEAL ECHOCARDIOGRAM (TEE);  Surgeon: [name], MD;  Location: [facility] ENDOSCOPY;  Service: Cardiovascular;  Laterality: N/A;   TEMPOROMANDIBULAR JOINT SURGERY Right    TOTAL KNEE ARTHROPLASTY Right 05/05/2018   Procedure: TOTAL KNEE ARTHROPLASTY;  Surgeon: [name], MD;  Location: [facility] ORS;  Service: Orthopedics;  Laterality: Right;    SOCIAL HISTORY:  Social History   Socioeconomic History   Marital status: Divorced    Spouse name: Not on file   Number of children: Not on file   Years of education: Not on file   Highest education level: Not on file  Occupational History   Occupation: Nurse    Comment: Retired  Tobacco Use   Smoking status: Former Smoker    Packs/day: 0.50    Years: 40.00    Pack years: 20.00    Types: Cigarettes    Quit date: 09/04/1995    Years since quitting: 24.3   Smokeless tobacco: Never Used  Substance and Sexual Activity   Alcohol use: No   Drug use: No    Types: Benzodiazepines, Hydrocodone   Sexual activity: Never  Other Topics Concern   Not on file  Social History Narrative   Divorced.  Former smoker, quit in 1997.  Nondrinker.    Social Determinants of Health   Financial Resource Strain:    Difficulty  of Paying Living Expenses:   Food Insecurity:    Worried About Charity fundraiser in the Last Year:    Arboriculturist in the Last Year:   Transportation Needs:    Film/video editor (Medical):    Lack of Transportation (Non-Medical):   Physical Activity:    Days of Exercise per Week:    Minutes of Exercise per Session:   Stress:    Feeling of Stress :   Social Connections:    Frequency of Communication with Friends and Family:    Frequency of Social Gatherings with Friends and Family:    Attends Religious Services:    Active Member of Clubs or Organizations:    Attends Music therapist:    Marital Status:   Intimate Partner Violence:    Fear of Current or Ex-Partner:    Emotionally Abused:    Physically Abused:    Sexually Abused:     FAMILY HISTORY:  Family History  Problem Relation Age of Onset   Anxiety disorder Mother  Depression Mother    Depression Maternal Grandmother    Alcohol abuse Brother    Cancer - Other Sister    ADD / ADHD Neg Hx    Bipolar disorder Neg Hx    Dementia Neg Hx    Drug abuse Neg Hx    OCD Neg Hx    Paranoid behavior Neg Hx    Schizophrenia Neg Hx    Seizures Neg Hx    Sexual abuse Neg Hx    Physical abuse Neg Hx     CURRENT MEDICATIONS:  Current Outpatient Medications  Medication Sig Dispense Refill   ALPRAZolam (XANAX) 0.5 MG tablet Take 1 tablet (0.5 mg total) by mouth at bedtime. 30 tablet 2   aspirin EC 81 MG tablet Take 81 mg by mouth every morning.     atorvastatin (LIPITOR) 40 MG tablet Take 1 tablet (40 mg total) by mouth daily at 6 PM. 90 tablet 2   calcium carbonate (OSCAL) 1500 (600 Ca) MG TABS tablet Take 600 mg of elemental calcium by mouth daily with supper.      cholecalciferol (VITAMIN D) 1000 units tablet Take 1 tablet (1,000 Units total) by mouth daily. 30 tablet 11   cyanocobalamin (,VITAMIN B-12,) 1000 MCG/ML injection Daily for 4 days; then weekly for 1  month and then monthly after that. 12 mL 0   escitalopram (LEXAPRO) 20 MG tablet Take 1 tablet (20 mg total) by mouth daily. 30 tablet 2   gabapentin (NEURONTIN) 300 MG capsule Take 1 capsule (300 mg total) by mouth at bedtime. 30 capsule 2   letrozole (FEMARA) 2.5 MG tablet TAKE 1 TABLET(2.5 MG) BY MOUTH DAILY 90 tablet 3   losartan (COZAAR) 25 MG tablet Take 0.5 tablets (12.5 mg total) by mouth at bedtime. 45 tablet 3   metFORMIN (GLUCOPHAGE) 500 MG tablet Take 1 tablet (500 mg total) by mouth 2 (two) times daily with a meal. 60 tablet 0   metoprolol succinate (TOPROL XL) 25 MG 24 hr tablet Take 1 tablet (25 mg total) by mouth daily. 30 tablet 11   risperiDONE (RISPERDAL) 1 MG tablet Take 1 tablet (1 mg total) by mouth at bedtime. 30 tablet 2   traZODone (DESYREL) 50 MG tablet Take 1 tablet (50 mg total) by mouth at bedtime. 30 tablet 2   venlafaxine XR (EFFEXOR-XR) 150 MG 24 hr capsule TAKE 1 CAPSULE(150 MG) BY MOUTH TWICE DAILY 60 capsule 2   No current facility-administered medications for this visit.    ALLERGIES:  Allergies  Allergen Reactions   Cymbalta [Duloxetine Hcl] Other (See Comments)    Hallucinated and couldn't think; got worse and worse the longer she took this.    PHYSICAL EXAM:  Performance status (ECOG): 1 - Symptomatic but completely ambulatory  There were no vitals filed for this visit. Wt Readings from Last 3 Encounters:  12/11/19 160 lb (72.6 kg)  11/09/19 158 lb (71.7 kg)  10/21/19 158 lb 14.4 oz (72.1 kg)   Physical Exam Vitals reviewed.  Constitutional:      Appearance: Normal appearance.  Cardiovascular:     Rate and Rhythm: Normal rate and regular rhythm.     Pulses: Normal pulses.     Heart sounds: Normal heart sounds.  Pulmonary:     Effort: Pulmonary effort is normal.     Breath sounds: Normal breath sounds.  Chest:     Breasts:        Right: Normal. No mass.  Left: Normal. No mass.  Abdominal:     Palpations: Abdomen is  soft.     Tenderness: There is no abdominal tenderness.  Musculoskeletal:     Right lower leg: No edema.     Left lower leg: No edema.  Lymphadenopathy:     Upper Body:     Right upper body: No axillary adenopathy.     Left upper body: No axillary adenopathy.  Neurological:     Mental Status: She is alert.   Breast exam was normal.  LABORATORY DATA:  I have reviewed the labs as listed.  CBC Latest Ref Rng & Units 12/30/2019 12/11/2019 10/06/2019  WBC 4.0 - 10.5 K/uL 7.0 6.0 7.9  Hemoglobin 12.0 - 15.0 g/dL 12.9 11.1(L) 12.6  Hematocrit 36.0 - 46.0 % 39.7 34.1(L) 39.4  Platelets 150 - 400 K/uL 234 161 208   CMP Latest Ref Rng & Units 12/30/2019 12/11/2019 10/07/2019  Glucose 70 - 99 mg/dL 173(H) 78 112(H)  BUN 8 - 23 mg/dL '19 12 22  '$ Creatinine 0.44 - 1.00 mg/dL 0.84 0.76 0.81  Sodium 135 - 145 mmol/L 137 140 140  Potassium 3.5 - 5.1 mmol/L 4.0 3.9 4.4  Chloride 98 - 111 mmol/L 97(L) 104 102  CO2 22 - 32 mmol/L '27 29 29  '$ Calcium 8.9 - 10.3 mg/dL 9.3 8.8(L) 8.8(L)  Total Protein 6.5 - 8.1 g/dL 6.5 - -  Total Bilirubin 0.3 - 1.2 mg/dL 0.5 - -  Alkaline Phos 38 - 126 U/L 76 - -  AST 15 - 41 U/L 23 - -  ALT 0 - 44 U/L 15 - -    DIAGNOSTIC IMAGING:  I have independently reviewed the scans and discussed with the patient. DG Ribs Unilateral W/Chest Right  Addendum Date: 12/11/2019   ADDENDUM REPORT: 12/11/2019 11:45 ADDENDUM: These results were called by telephone at the time of interpretation on 12/11/2019 at 11:45 am to provider [facility] , who verbally acknowledged these results. Electronically Signed   By: Zetta Bills M.D.   On: 12/11/2019 11:45   Result Date: 12/11/2019 CLINICAL DATA:  Fall on Wednesday with pain RIGHT rib pain. EXAM: RIGHT RIBS AND CHEST - 3+ VIEW COMPARISON:  10/05/2019 FINDINGS: Loop recorder projects over the LEFT cardiac border. Cardiomediastinal contours with some tortuosity of the descending thoracic aorta. The descending thoracic aortic contour appears  different though this could be related to projection. Heart size is stable. Lungs are clear. No pleural effusion. Signs of spinal fusion in the thoracolumbar spine with similar appearance. No displaced rib fracture is noted. IMPRESSION: 1. No displaced rib fracture. No pneumothorax. 2. Accentuation of the descending thoracic aortic contour, potentially related to projection but this appears to be markedly different than on prior imaging evaluations. Consider CT of the chest to exclude an acute aortic process or further aneurysmal dilation of the thoracic aorta. There are no secondary signs of acute process. 3. Signs of spinal fusion. 4. A call is out to the referring provider to further discuss findings in the above case. Electronically Signed: By: Zetta Bills M.D. On: 12/11/2019 11:34   CT Head Wo Contrast  Result Date: 12/11/2019 CLINICAL DATA:  Unwitnessed fall several days ago. EXAM: CT HEAD WITHOUT CONTRAST TECHNIQUE: Contiguous axial images were obtained from the base of the skull through the vertex without intravenous contrast. COMPARISON:  Head CT 10/05/2019 FINDINGS: Brain: No evidence of acute infarction, hemorrhage, hydrocephalus, extra-axial collection or mass lesion/mass effect. Stable degree of atrophy. Moderate to extensive chronic small  vessel ischemia which is unchanged from prior exam. Slight rightward shift is chronic and unchanged from prior imaging. Vascular: Atherosclerosis of skullbase vasculature without hyperdense vessel or abnormal calcification. Skull: No fracture or focal lesion. Sinuses/Orbits: Assessed on concurrent face CT, reported separately. Other: None. IMPRESSION: 1. No acute intracranial abnormality. No skull fracture. 2. Stable atrophy and chronic small vessel ischemia. Electronically Signed   By: Keith Rake M.D.   On: 12/11/2019 19:03   MM DIAG BREAST TOMO BILATERAL  Result Date: 12/07/2019 CLINICAL DATA:  84 year old female with history of right breast cancer  in 2018 status post lumpectomy. Patient does not recall having any radiation therapy. EXAM: DIGITAL DIAGNOSTIC BILATERAL MAMMOGRAM WITH CAD AND TOMO COMPARISON:  Previous exam(s). ACR Breast Density Category c: The breast tissue is heterogeneously dense, which may obscure small masses. FINDINGS: Right breast: Spot magnification 2D views of the lumpectomy site were performed in addition to standard views. There are stable postsurgical changes. No suspicious mass, distortion, or microcalcifications are identified to suggest presence of malignancy. Left breast: No suspicious mass, distortion, or microcalcifications are identified to suggest presence of malignancy. Mammographic images were processed with CAD. IMPRESSION: No mammographic evidence of malignancy bilaterally. Stable post lumpectomy changes in the right breast. RECOMMENDATION: Diagnostic bilateral mammogram in 1 year. I have discussed the findings and recommendations with the patient. If applicable, a reminder letter will be sent to the patient regarding the next appointment. BI-RADS CATEGORY  2: Benign. Electronically Signed   By: Audie Pinto M.D.   On: 12/07/2019 11:28   CUP PACEART REMOTE DEVICE CHECK  Result Date: 12/30/2019 [facility] summary report received. Battery status OK. Normal device function. No new symptom episodes, tachy episodes, brady, or pause episodes. Two new AF episodes both two minutes, appears to be SR with PACs and not a true AF. Monthly summary reports and ROV/PRN Kathy Breach, RN, CCDS, [employer]  CT Angio Chest/Abd/Pel for Dissection W and/or Wo Contrast  Result Date: 12/11/2019 CLINICAL DATA:  Dilation of the aorta. Suggested on chest x-ray EXAM: CT ANGIOGRAPHY CHEST, ABDOMEN AND PELVIS TECHNIQUE: Non-contrast CT of the chest was initially obtained. Multidetector CT imaging through the chest, abdomen and pelvis was performed using the standard protocol during bolus administration of intravenous contrast.  Multiplanar reconstructed images and MIPs were obtained and reviewed to evaluate the vascular anatomy. CONTRAST:  160m OMNIPAQUE IOHEXOL 350 MG/ML SOLN COMPARISON:  Multiple prior imaging studies dating back to August of 2020 FINDINGS: CTA CHEST FINDINGS Cardiovascular: No pericardial effusion. No sign of intramural hematoma. Dilation of the descending thoracic aorta to 4 x 3.9 cm. This is more dilated than in 2020 where it measured approximately 3.9 by 3.6 cm. No periaortic stranding. No effusion. Signs of ulcerated plaque or areas of focal penetrating atherosclerotic ulcer on image 113 of series 11 and image 100 of series 11. w Ascending aortic caliber 3.1 cm. Mild dilation of central pulmonary vasculature which is otherwise unremarkable. In the descending thoracic aorta are similar to the prior exam from October of 2020 and March 05, 2019 Mediastinum/Nodes: No mediastinal hematoma. Thoracic inlet structures are unremarkable. No axillary adenopathy. Lungs/Pleura: No consolidation. No pleural effusion. No pneumothorax. Airways are patent. Musculoskeletal: No displaced rib fracture. No chest wall mass. Sternum is intact. Review of the MIP images confirms the above findings. CTA ABDOMEN AND PELVIS FINDINGS VASCULAR Aorta: Patent without signs of dissection, vasculitis or aneurysmal dilation in the abdomen. Calcified and noncalcified plaque in the abdominal aorta. Celiac: Celiac is patent  with classic vascular anatomy. SMA: SMA is patent without signs of narrowing or significant atheromatous plaque. Renals: Renal arteries, single renal arteries bilaterally are patent with atheromatous plaque at the origin of the renal arteries bilaterally greatest on the LEFT. Vessels remain patent. IMA: IMA is patent. Inflow: Patent with dilated thoracic aorta along descending thoracic aorta as described. Outflow: Patent outflow into the upper thigh with proximal portions of superficial femoral and profundus femoral artery  visualized. Review of the MIP images confirms the above findings. NON-VASCULAR Hepatobiliary: Liver unremarkable. Limited assessment on arterial phase. Normal appearance of the gallbladder. No biliary ductal dilation. Pancreas: Pancreas normal without signs of peripancreatic inflammation. No ductal dilation. Spleen: Spleen normal size without focal lesion. Adrenals/Urinary Tract: Adrenal glands are normal. Renal cortical scarring bilaterally. Stomach/Bowel: Small hiatal hernia. Extensive colonic diverticulosis. No sign of diverticulitis. Lymphatic: Adenopathy in the lower retroperitoneum and in the pelvis, increased when compared to the prior study. Bulky lymph nodes in the pelvis, largest on the LEFT measuring 2 x 3.9 cm previously approximately 1.7 cm short axis. LEFT pelvic sidewall lymph node 1.7 cm short axis, previously 1.1 cm. Another LEFT external iliac lymph node (image 265, series 11) 1.7 cm previously approximately 1.5 cm. Increasing size of retroperitoneal lymph nodes retrocaval lymph node (image 200, series 11) 1 cm short axis previously 9 mm. LEFT periaortic lymph nodes largest 11 mm short axis (image 185, series 11) previously approximately 10 mm. Presacral lymph nodes (image 24, series 11) 1 cm short axis previously approximately 8 mm. Reproductive: Uterus remains in place. No adnexal mass. Other: No abdominal wall hernia. Musculoskeletal: Signs of spinal fusion and degenerative change in the spine. No acute bone finding. No destructive bone process. Anterolisthesis of L4 on L5 is similar to the previous exam. Review of the MIP images confirms the above findings. IMPRESSION: 1. Dilation of the descending thoracic aorta to 4 x 3.9 cm, more dilated than in 2020 where it measured approximately 3.9 by 3.6 cm. No periaortic stranding. Findings are indicative of slight interval enlargement of descending thoracic aortic aneurysm with fusiform slightly eccentric dilation favoring the posterior wall,  associated with areas of focal penetrating atherosclerotic ulceration. 2. Accounting for differences in technique areas of atherosclerotic ulceration are similar to the prior study. If there is any chest pain or other symptoms that would suggest acute aortic pathology these areas should be considered. No findings to suggest acute pathology at this time. 3. Nondisplaced fracture of the RIGHT anterior seventh rib. 4. Worsening of adenopathy in the lower retroperitoneum and pelvis, suspicious for metastatic disease, distribution would favor a pelvic primary. 5. Extensive colonic diverticulosis without evidence of diverticulitis. 6. Aortic atherosclerosis. Aortic Atherosclerosis (ICD10-I70.0). Electronically Signed   By: Zetta Bills M.D.   On: 12/11/2019 19:42   CT Maxillofacial Wo Contrast  Result Date: 12/11/2019 CLINICAL DATA:  Unwitnessed fall with right zygomatic tenderness. Bruising to right side of face. EXAM: CT MAXILLOFACIAL WITHOUT CONTRAST TECHNIQUE: Multidetector CT imaging of the maxillofacial structures was performed. Multiplanar CT image reconstructions were also generated. COMPARISON:  No prior face CT. FINDINGS: Osseous: Zygomatic arches, nasal bone, and mandibles are intact. The temporomandibular joints are congruent with degenerative change. Streak artifact from dental hardware partially obscures detailed tooth evaluation. Nasal septum is midline. Orbits: No acute orbital fracture. Both orbits and globes are intact. Sinuses: No sinus fracture or fluid level. Mastoid air cells are clear. Soft tissues: Soft tissue edema overlies the right face. Limited intracranial: Assessed on concurrent head CT, reported  separately. Other: Stable oversew of normal cervical lordosis and anterolisthesis of C3 on C4 compared with 03/21/2019 neck CT. IMPRESSION: Soft tissue edema overlies the right face. No facial bone fracture. Electronically Signed   By: Keith Rake M.D.   On: 12/11/2019 19:07      ASSESSMENT:  1.  Stage Ia right breast cancer: -Status post lumpectomy with Dr. Arnoldo Morale on 10/09/2016.  Pathology showed IDC of right breast, ER/PR positive, HER-2 negative. -Did not receive radiation.  Started on anastrozole in March 2018.  She did not take anastrozole from 05/17/2017 through 10/15/2017 likely from intolerance. -She was subsequently started on letrozole 2.5 mg daily. -Mammogram on 12/07/2019 was BI-RADS Category 2.  2.  Osteopenia: -DEXA scan on 11/26/2017 shows T score of -1.7. -Prolia every 6 months started on 12/19/2016.   PLAN:  1.  Stage Ia right breast cancer: -Physical exam today did not reveal any palpable masses.  I have reviewed her labs.  LFTs and CBC are within normal limits.  Calcium was normal. -Continue letrozole.  2.  Osteopenia: -Continue Prolia every 6 months.  Vitamin D 72.7.  3.  Retroperitoneal and pelvic adenopathy: -CT angio of the chest, abdomen and pelvis on 12/06/2019 showed incidental findings of adenopathy in the lower retroperitoneum and in the pelvis.  Bulky lymph nodes in the pelvis, largest on the left measuring 2 x 3.9 cm, previously 1.7 cm in short axis.  Left pelvic sidewall lymph node measures 1.7 cm.  Left para-aortic lymph node measures 11 mm. -I will check tumor markers including CEA, CA-125, CA 15-3.  We will also order a PET scan. -We will see her back after the PET scan.    Orders placed this encounter:  No orders of the defined types were placed in this encounter.  Total time spent is 30 minutes with more than 50% of the time spent face-to-face reviewing scans, further plan of action, counseling and coordination of care.  Derek Jack, MD [place] [phone]   I, Milinda Antis, am acting as a scribe for Dr. Sanda Linger.  I, Derek Jack MD, have reviewed the above documentation for accuracy and completeness, and I agree with the above.

## 2020-01-04 NOTE — Progress Notes (Signed)
Patient taking calcium as directed.  Denied tooth, jaw, and leg pain.  No recent or upcoming dental visits.  Patient tolerated injection with no complaints.  Site clean and dry with no bruising or swelling noted at site.  Band aid applied.  VSS with discharge and left in a wheelchair with no s/s of distress noted. °

## 2020-01-04 NOTE — Patient Instructions (Addendum)
[place] at [facility] Discharge Instructions  You were seen today by Dr. Delton Coombes. He went over your recent results, including your imaging results. You will be scheduled for a PET scan. Dr. Delton Coombes will see you back in 6 weeks for labs and follow up.   Thank you for choosing [place] at [facility] to provide your oncology and hematology care.  To afford each patient quality time with our provider, please arrive at least 15 minutes before your scheduled appointment time.   If you have a lab appointment with the [facility] please come in thru the Main Entrance and check in at the main information desk  You need to re-schedule your appointment should you arrive 10 or more minutes late.  We strive to give you quality time with our providers, and arriving late affects you and other patients whose appointments are after yours.  Also, if you no show three or more times for appointments you may be dismissed from the clinic at the providers discretion.     Again, thank you for choosing [facility].  Our hope is that these requests will decrease the amount of time that you wait before being seen by our physicians.       _____________________________________________________________  Should you have questions after your visit to [facility], please contact our office at [phone] between the hours of 8:00 a.m. and 4:30 p.m.  Voicemails left after 4:00 p.m. will not be returned until the following business day.  For prescription refill requests, have your pharmacy contact our office and allow 72 hours.    Cancer Center Support Programs:   > Cancer Support Group  2nd Tuesday of the month 1pm-2pm, Journey Room

## 2020-01-06 ENCOUNTER — Telehealth (INDEPENDENT_AMBULATORY_CARE_PROVIDER_SITE_OTHER): Payer: Medicare HMO | Admitting: Psychiatry

## 2020-01-06 ENCOUNTER — Other Ambulatory Visit: Payer: Self-pay

## 2020-01-06 ENCOUNTER — Encounter (HOSPITAL_COMMUNITY): Payer: Self-pay | Admitting: Psychiatry

## 2020-01-06 DIAGNOSIS — F330 Major depressive disorder, recurrent, mild: Secondary | ICD-10-CM

## 2020-01-06 MED ORDER — ESCITALOPRAM OXALATE 20 MG PO TABS: 20.0000 mg | ORAL_TABLET | Freq: Every day | ORAL | 2 refills | Status: DC

## 2020-01-06 MED ORDER — VENLAFAXINE HCL ER 150 MG PO CP24: ORAL_CAPSULE | ORAL | 2 refills | Status: DC

## 2020-01-06 MED ORDER — TRAZODONE HCL 50 MG PO TABS: 50.0000 mg | ORAL_TABLET | Freq: Every day | ORAL | 2 refills | Status: DC

## 2020-01-06 MED ORDER — ALPRAZOLAM 0.5 MG PO TABS: 0.5000 mg | ORAL_TABLET | Freq: Every day | ORAL | 2 refills | Status: DC

## 2020-01-06 MED ORDER — RISPERIDONE 1 MG PO TABS: 1.0000 mg | ORAL_TABLET | Freq: Every day | ORAL | 2 refills | Status: DC

## 2020-01-06 NOTE — Progress Notes (Signed)
Virtual Visit via Telephone Note  I connected with Amy [name] on 01/06/20 at  9:20 AM EDT by telephone and verified that I am speaking with the correct person using two identifiers.   I discussed the limitations, risks, security and privacy concerns of performing an evaluation and management service by telephone and the availability of in person appointments. I also discussed with the patient that there may be a patient responsible charge related to this service. The patient expressed understanding and agreed to proceed    I discussed the assessment and treatment plan with the patient. The patient was provided an opportunity to ask questions and all were answered. The patient agreed with the plan and demonstrated an understanding of the instructions.   The patient was advised to call back or seek an in-person evaluation if the symptoms worsen or if the condition fails to improve as anticipated.  I provided 15 minutes of non-face-to-face time during this encounter. Location: Provider home, patient home  Levonne Spiller, MD  [facility] MD/PA/NP OP Progress Note  01/06/2020 9:52 AM Amy [name]  MRN:  [id number]  Chief Complaint:  Chief Complaint    Memory Loss; Depression; Anxiety; Follow-up     HPI: This patient is an 84 year old divorced white female who lives alone in [place].  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  She has 2 grown adopted children and 2 grandchildren.  The patient returns for follow-up after 4 months.  She has a history of depression anxiety and has been most recently been diagnosed with dementia.  She underwent neuropsychological testing which confirmed this a couple of months ago.  She also fell last month and cracked one of her ribs.  However today she seems like she is in pretty good spirits.  I spoke to both the patient and her caregiver Mechele Claude.  She has a caregiver coming in 6 days a week and they stay till 3 PM.  Her son and his wife  also check on her frequently.  She states that she is been in a pretty good mood.  She is sleeping well denies auditory visualizations or paranoia.  She denies being depressed or having thoughts of suicide.  She states she very rarely has anxiety.  Her caregiver verified all of the above.  Her pain level is "not too bad" right now.  She states that her son wants her to go into a nursing home but she does not want to go because she does not want to leave her friends and family.  Right now she seems to be functioning fairly well with the current situation. Visit Diagnosis:    ICD-10-CM   1. Major depressive disorder, recurrent episode, mild with anxious distress (HCC)  F33.0 ALPRAZolam (XANAX) 0.5 MG tablet    Past Psychiatric History: Psychiatric hospitalization in her 8s, since then outpatient treatment  Past Medical History:  Past Medical History:  Diagnosis Date  . Anxiety   . Arthritis   . Back pain   . Colon polyps   . Depression   . Diabetes mellitus, type II ([place])    diet controlled  . Hypercholesterolemia   . Invasive ductal carcinoma of breast, female, right ([place])   . Osteopenia 11/26/2016  . Skin-picking disorder    has 3 open wounds on right wrist that are being treated. About a quarter in size.  . Stroke [facility])     Past Surgical History:  Procedure Laterality Date  . APPENDECTOMY    .  BACK SURGERY    . BREAST BIOPSY Right 10/09/2016   Procedure: BREAST BIOPSY WITH NEEDLE LOCALIZATION;  Surgeon: [name], MD;  Location: [facility];  Service: General;  Laterality: Right;  . BREAST SURGERY Right   . BUBBLE STUDY  03/23/2019   Procedure: BUBBLE STUDY;  Surgeon: [name], MD;  Location: [facility] ENDOSCOPY;  Service: Cardiovascular;;  . BUNIONECTOMY Bilateral   . FACIAL COSMETIC SURGERY     drooping eyelid and brow  . HERNIA REPAIR Right    Inguinal x2  . implantable loop recorder placement  10/21/2019    Medtronic Reveal [place] model LNQ 11 [id number]) implanted by Dr  [name] for cryptogenic stroke indication  . KNEE ARTHROSCOPY Right   . SHOULDER SURGERY Right    rotator cuff repair and removal of bone spur  . TEE WITHOUT CARDIOVERSION N/A 03/23/2019   Procedure: TRANSESOPHAGEAL ECHOCARDIOGRAM (TEE);  Surgeon: [name], MD;  Location: [facility] ENDOSCOPY;  Service: Cardiovascular;  Laterality: N/A;  . TEMPOROMANDIBULAR JOINT SURGERY Right   . TOTAL KNEE ARTHROPLASTY Right 05/05/2018   Procedure: TOTAL KNEE ARTHROPLASTY;  Surgeon: [name], MD;  Location: [facility];  Service: Orthopedics;  Laterality: Right;    Family Psychiatric History: see below  Family History:  Family History  Problem Relation Age of Onset  . Anxiety disorder Mother   . Depression Mother   . Depression Maternal Grandmother   . Alcohol abuse Brother   . Cancer - Other Sister   . ADD / ADHD Neg Hx   . Bipolar disorder Neg Hx   . Dementia Neg Hx   . Drug abuse Neg Hx   . OCD Neg Hx   . Paranoid behavior Neg Hx   . Schizophrenia Neg Hx   . Seizures Neg Hx   . Sexual abuse Neg Hx   . Physical abuse Neg Hx     Social History:  Social History   Socioeconomic History  . Marital status: Divorced    Spouse name: Not on file  . Number of children: Not on file  . Years of education: Not on file  . Highest education level: Not on file  Occupational History  . Occupation: Nurse    Comment: Retired  Tobacco Use  . Smoking status: Former Smoker    Packs/day: 0.50    Years: 40.00    Pack years: 20.00    Types: Cigarettes    Quit date: 09/04/1995    Years since quitting: 24.3  . Smokeless tobacco: Never Used  Vaping Use  . Vaping Use: Never used  Substance and Sexual Activity  . Alcohol use: No  . Drug use: No    Types: Benzodiazepines, Hydrocodone  . Sexual activity: Never  Other Topics Concern  . Not on file  Social History Narrative   Divorced.  Former smoker, quit in 1997.  Nondrinker.    Social Determinants of Health   Financial Resource Strain:    . Difficulty of Paying Living Expenses:   Food Insecurity:   . Worried About Charity fundraiser in the Last Year:   . Arboriculturist in the Last Year:   Transportation Needs:   . Film/video editor (Medical):   Marland Kitchen Lack of Transportation (Non-Medical):   Physical Activity:   . Days of Exercise per Week:   . Minutes of Exercise per Session:   Stress:   . Feeling of Stress :   Social Connections:   . Frequency of Communication with Friends  and Family:   . Frequency of Social Gatherings with Friends and Family:   . Attends Religious Services:   . Active Member of Clubs or Organizations:   . Attends Archivist Meetings:   Marland Kitchen Marital Status:     Allergies:  Allergies  Allergen Reactions  . Cymbalta [Duloxetine Hcl] Other (See Comments)    Hallucinated and couldn't think; got worse and worse the longer she took this.    Metabolic Disorder Labs: Lab Results  Component Value Date   HGBA1C 5.8 (H) 10/05/2019   MPG 119.76 10/05/2019   MPG 134.11 08/17/2019   No results found for: PROLACTIN Lab Results  Component Value Date   CHOL 172 05/06/2019   TRIG 203 (H) 05/06/2019   HDL 83 05/06/2019   CHOLHDL 2.1 05/06/2019   VLDL 35 03/21/2019   LDLCALC 57 05/06/2019   LDLCALC 153 (H) 03/21/2019   Lab Results  Component Value Date   TSH 1.238 10/07/2019   TSH 1.125 10/05/2019    Therapeutic Level Labs: No results found for: LITHIUM No results found for: VALPROATE No components found for:  CBMZ  Current Medications: Current Outpatient Medications  Medication Sig Dispense Refill  . ALPRAZolam (XANAX) 0.5 MG tablet Take 1 tablet (0.5 mg total) by mouth at bedtime. 30 tablet 2  . aspirin EC 81 MG tablet Take 81 mg by mouth every morning.    Marland Kitchen atorvastatin (LIPITOR) 40 MG tablet Take 1 tablet (40 mg total) by mouth daily at 6 PM. 90 tablet 2  . calcium carbonate (OSCAL) 1500 (600 Ca) MG TABS tablet Take 600 mg of elemental calcium by mouth daily with supper.      . cholecalciferol (VITAMIN D) 1000 units tablet Take 1 tablet (1,000 Units total) by mouth daily. 30 tablet 11  . cyanocobalamin (,VITAMIN B-12,) 1000 MCG/ML injection Daily for 4 days; then weekly for 1 month and then monthly after that. 12 mL 0  . escitalopram (LEXAPRO) 20 MG tablet Take 1 tablet (20 mg total) by mouth daily. 30 tablet 2  . gabapentin (NEURONTIN) 300 MG capsule Take 1 capsule (300 mg total) by mouth at bedtime. 30 capsule 2  . ibuprofen (ADVIL) 800 MG tablet Take 800 mg by mouth 3 (three) times daily as needed.    Marland Kitchen letrozole (FEMARA) 2.5 MG tablet TAKE 1 TABLET(2.5 MG) BY MOUTH DAILY 90 tablet 3  . losartan (COZAAR) 25 MG tablet Take 0.5 tablets (12.5 mg total) by mouth at bedtime. 45 tablet 3  . metFORMIN (GLUCOPHAGE) 500 MG tablet Take 1 tablet (500 mg total) by mouth 2 (two) times daily with a meal. 60 tablet 0  . metoprolol succinate (TOPROL XL) 25 MG 24 hr tablet Take 1 tablet (25 mg total) by mouth daily. 30 tablet 11  . oxybutynin (DITROPAN) 5 MG tablet Take 5 mg by mouth at bedtime.    . risperiDONE (RISPERDAL) 1 MG tablet Take 1 tablet (1 mg total) by mouth at bedtime. 30 tablet 2  . traZODone (DESYREL) 50 MG tablet Take 1 tablet (50 mg total) by mouth at bedtime. 30 tablet 2  . venlafaxine XR (EFFEXOR-XR) 150 MG 24 hr capsule TAKE 1 CAPSULE(150 MG) BY MOUTH TWICE DAILY 60 capsule 2   No current facility-administered medications for this visit.     Musculoskeletal: Strength & Muscle Tone: decreased Gait & Station: unsteady Patient leans: N/A  Psychiatric Specialty Exam: Review of Systems  Musculoskeletal: Positive for arthralgias, back pain, gait problem and myalgias.  Psychiatric/Behavioral:  Positive for confusion. The patient is nervous/anxious.   All other systems reviewed and are negative.   There were no vitals taken for this visit.There is no height or weight on file to calculate BMI.  General Appearance: NA  Eye Contact:  NA  Speech:  Clear and  Coherent  Volume:  Normal  Mood:  Euthymic  Affect:  NA  Thought Process:  Goal Directed  Orientation:  Full (Time, Place, and Person)  Thought Content: WDL   Suicidal Thoughts:  No  Homicidal Thoughts:  No  Memory:  Immediate;   Fair Recent;   Fair Remote;   Poor  Judgement:  Fair  Insight:  Shallow  Psychomotor Activity:  Decreased  Concentration:  Concentration: Fair and Attention Span: Fair  Recall:  Poor  Fund of Knowledge: Fair  Language: Good  Akathisia:  No  Handed:  Right  AIMS (if indicated): not done  Assets:  Communication Skills Resilience Social Support  ADL's:  Intact  Cognition: Impaired,  Mild  Sleep:  Good   Screenings: MDI     Office Visit from 02/15/2016 in [facility]  Total Score (max 50) 17    Mini-Mental     Office Visit from 11/22/2019 in [facility] Visit from 09/06/2019 in [facility]  Total Score (max 30 points ) 21 (P)  22       Assessment and Plan: This patient is an 84 year old female with a history of depression anxiety and recently diagnosed dementia probably due to vascular issues and numerous strokes.  She seems fairly intact today and is doing fairly well with the supervision provided by her caregivers and her family.  She denies significant depression suicidal ideation confusion agitation.  She will continue Lexapro 20 mg daily for depression as well as Effexor XR 150 mg twice daily for depression, trazodone 50 mg at bedtime for sleep, Risperdal 1 mg at bedtime for paranoid ideation and Xanax 0.5 mg daily at bedtime as needed for anxiety she will return to see me in 3 months   Levonne Spiller, MD 01/06/2020, 9:52 AM

## 2020-02-01 ENCOUNTER — Ambulatory Visit (INDEPENDENT_AMBULATORY_CARE_PROVIDER_SITE_OTHER): Payer: Medicare HMO | Admitting: *Deleted

## 2020-02-01 DIAGNOSIS — I639 Cerebral infarction, unspecified: Secondary | ICD-10-CM | POA: Diagnosis not present

## 2020-02-01 LAB — CUP PACEART REMOTE DEVICE CHECK
Date Time Interrogation Session: 20210706024841
Implantable Pulse Generator Implant Date: 20210325

## 2020-02-03 NOTE — Progress Notes (Signed)
[facility] Summary Report / Loop Recorder 

## 2020-02-14 ENCOUNTER — Encounter (HOSPITAL_COMMUNITY)
Admission: RE | Admit: 2020-02-14 | Discharge: 2020-02-14 | Disposition: A | Discharge status: Home / Self Care | Payer: Medicare HMO | Source: Ambulatory Visit | Attending: Hematology | Admitting: Hematology

## 2020-02-14 ENCOUNTER — Inpatient Hospital Stay (HOSPITAL_COMMUNITY): Payer: Medicare HMO | Attending: Hematology

## 2020-02-14 ENCOUNTER — Other Ambulatory Visit: Payer: Self-pay

## 2020-02-14 DIAGNOSIS — M8580 Other specified disorders of bone density and structure, unspecified site: Secondary | ICD-10-CM | POA: Insufficient documentation

## 2020-02-14 DIAGNOSIS — M545 Low back pain: Secondary | ICD-10-CM | POA: Diagnosis not present

## 2020-02-14 DIAGNOSIS — C50911 Malignant neoplasm of unspecified site of right female breast: Secondary | ICD-10-CM | POA: Diagnosis not present

## 2020-02-14 DIAGNOSIS — R591 Generalized enlarged lymph nodes: Secondary | ICD-10-CM

## 2020-02-14 DIAGNOSIS — Z79811 Long term (current) use of aromatase inhibitors: Secondary | ICD-10-CM | POA: Insufficient documentation

## 2020-02-14 DIAGNOSIS — Z170 Estrogen receptor positive status [ER+]: Secondary | ICD-10-CM | POA: Diagnosis not present

## 2020-02-14 DIAGNOSIS — Z87891 Personal history of nicotine dependence: Secondary | ICD-10-CM | POA: Insufficient documentation

## 2020-02-14 DIAGNOSIS — Z96651 Presence of right artificial knee joint: Secondary | ICD-10-CM | POA: Diagnosis not present

## 2020-02-14 MED ORDER — FLUDEOXYGLUCOSE F - 18 (FDG) INJECTION
10.5000 | Freq: Once | INTRAVENOUS | Status: AC | PRN
  Administered 2020-02-14: 10.5 via INTRAVENOUS

## 2020-02-15 LAB — CANCER ANTIGEN 15-3: CA 15-3: 18.8 U/mL (ref 0.0–25.0)

## 2020-02-15 LAB — CA 125: Cancer Antigen (CA) 125: 15.9 U/mL (ref 0.0–38.1)

## 2020-02-15 LAB — CANCER ANTIGEN 27.29: CA 27.29: 19.2 U/mL (ref 0.0–38.6)

## 2020-02-15 LAB — CEA: CEA: 2.6 ng/mL (ref 0.0–4.7)

## 2020-02-16 ENCOUNTER — Inpatient Hospital Stay (HOSPITAL_BASED_OUTPATIENT_CLINIC_OR_DEPARTMENT_OTHER): Payer: Medicare HMO | Admitting: Hematology

## 2020-02-16 VITALS — BP 131/75 | HR 68 | Temp 96.9°F | Resp 18 | Wt 164.0 lb

## 2020-02-16 DIAGNOSIS — C50911 Malignant neoplasm of unspecified site of right female breast: Secondary | ICD-10-CM | POA: Diagnosis not present

## 2020-02-16 DIAGNOSIS — R590 Localized enlarged lymph nodes: Secondary | ICD-10-CM | POA: Diagnosis not present

## 2020-02-16 NOTE — Patient Instructions (Signed)
[place] at [facility] Discharge Instructions  You were seen today by Dr. Delton Coombes. He went over your recent results and scans. Be vigilant for any symptoms, including fevers, chills, night sweats, or unexpected weight loss, and notify the office immediately. You will be referred to a general surgeon for a lymph node biopsy. Dr. Delton Coombes will see you back after the biopsy for labs and follow up.   Thank you for choosing [place] at [facility] to provide your oncology and hematology care.  To afford each patient quality time with our provider, please arrive at least 15 minutes before your scheduled appointment time.   If you have a lab appointment with the [facility] please come in thru the Main Entrance and check in at the main information desk  You need to re-schedule your appointment should you arrive 10 or more minutes late.  We strive to give you quality time with our providers, and arriving late affects you and other patients whose appointments are after yours.  Also, if you no show three or more times for appointments you may be dismissed from the clinic at the providers discretion.     Again, thank you for choosing [facility].  Our hope is that these requests will decrease the amount of time that you wait before being seen by our physicians.       _____________________________________________________________  Should you have questions after your visit to [facility], please contact our office at [phone] between the hours of 8:00 a.m. and 4:30 p.m.  Voicemails left after 4:00 p.m. will not be returned until the following business day.  For prescription refill requests, have your pharmacy contact our office and allow 72 hours.    Cancer Center Support Programs:   > Cancer Support Group  2nd Tuesday of the month 1pm-2pm, Journey Room

## 2020-02-16 NOTE — Progress Notes (Signed)
[place], [place] [id number]   CLINIC:  Medical Oncology/Hematology  PCP:  Amy Gravel, MD [address] Amy [name] [phone]   REASON FOR VISIT:  Follow-up for lymphadenopathy and right breast [name]  PRIOR THERAPY:  1. Right lumpectomy on 10/09/2016 2. Anastrozole from 09/2016 to 10/15/2017  CURRENT THERAPY: Prolia and letrozole  BRIEF ONCOLOGIC HISTORY:  Oncology History  Invasive ductal carcinoma of breast, female, right ([place])  10/09/2016 Procedure   Left breast lumpectomy by Amy. Arnoldo Morale   10/16/2016 Pathology Results   Invasive, grade 1, ductal carcinoma, spanning 0.18 cmIn greatest dimension.  Low and intermediate grade DCIS with associated calcifications.  Intraductal papilloma formation with associated low-grade DCIS.  DCIS is less than 0.1 cm from the margin in multiple areas including intermediate ductal carcinoma in situ adjacent to cauterized edge.    10/16/2016 Pathology Results   ER +100%, PR +90%, HER-2 negative.   10/23/2016 [name] Staging   [name] Staging Invasive ductal carcinoma of breast, female, right [facility]) Staging form: Breast, AJCC 8th Edition - Pathologic stage from 10/23/2016: Stage Unknown (pT1a, pNX, cM0, G1, ER: Positive, PR: Positive, HER2: Negative) - Signed by Amy Cancer, PA-C on 10/23/2016    11/14/2016 Imaging   CT CAP-  1. No compelling findings of metastatic breast [name]. 2. Acute mild diverticulitis of the descending colon. 3. Further distally in the descending colon but there is a cluster of small lymph nodes adjacent to the colon. Although possibly related to the diverticulitis this is a bit distend and this type of clustered nodes is unusual in this location. Following resolution of the acute process, consider colonoscopy to exclude an underlying occult [name]. 4. 3.7 cm fatty mass along the lumen of the descending colon favors a lipoma or fatty polyp. 5.  Aortic Atherosclerosis (ICD10-I70.0). 6. Minimal tree-in-bud reticulonodular opacities in the left lower lobe characteristic for atypical infectious bronchiolitis. 7. Left groin hernia containing adipose tissue, probably a direct inguinal hernia. 8. Exaggerated lumbar lordosis with grade 1 degenerative anterolisthesis at L4-5.   11/26/2016 Imaging   Bone density- BMD as determined from Femur Neck Left is 0.820 g/cm2 with a T-Score of -1.6.  This patient is considered OSTEOPENIC according to [facility]) criteria.     [name] STAGING: [name] Staging Invasive ductal carcinoma of breast, female, right [facility]) Staging form: Breast, AJCC 8th Edition - Pathologic stage from 10/23/2016: Stage Unknown (pT1a, pNX, cM0, G1, ER: Positive, PR: Positive, HER2: Negative) - Signed by Amy Cancer, PA-C on 10/23/2016   INTERVAL HISTORY:  Amy [name], a 84 y.o. female, returns for routine follow-up of her lymphadenopathy and right breast [name]. Amy [name] was last seen on 01/04/2020.  Today she reports having worsening lower back pain. She is tolerating the letrozole well. She denies having any fevers, chills, night sweats, or weight loss.  She ambulates with a walker and has quite a bit of help at home; she can dress herself, she has a shower seat to assist with her showering.   REVIEW OF SYSTEMS:  Review of Systems  Constitutional: Negative for appetite change, chills, diaphoresis, fatigue, fever and unexpected weight change.  Genitourinary: Positive for dysuria (UTI).   Musculoskeletal: Positive for back pain (7/10 lower back pain).  Psychiatric/Behavioral: Positive for depression.  All other systems reviewed and are negative.   PAST MEDICAL/SURGICAL HISTORY:  Past Medical History:  Diagnosis Date  . Anxiety   . Arthritis   .  Back pain   . Colon polyps   . Depression   . Diabetes mellitus, type II (Amy [name])    diet controlled  . Hypercholesterolemia   .  Invasive ductal carcinoma of breast, female, right (Amy [name])   . Osteopenia 11/26/2016  . Skin-picking disorder    has 3 open wounds on right wrist that are being treated. About a quarter in size.  . Stroke Amy [name])    Past Surgical History:  Procedure Laterality Date  . APPENDECTOMY    . BACK SURGERY    . BREAST BIOPSY Right 10/09/2016   Procedure: BREAST BIOPSY WITH NEEDLE LOCALIZATION;  Surgeon: Amy Signs, MD;  Location: [facility];  Service: General;  Laterality: Right;  . BREAST SURGERY Right   . BUBBLE STUDY  03/23/2019   Procedure: BUBBLE STUDY;  Surgeon: Amy Artist, MD;  Location: [name] ENDOSCOPY;  Service: Cardiovascular;;  . BUNIONECTOMY Bilateral   . FACIAL COSMETIC SURGERY     drooping eyelid and brow  . HERNIA REPAIR Right    Inguinal x2  . implantable loop recorder placement  10/21/2019    Amy Reveal [place]ument model LNQ [id number]) implanted by Amy [name] for cryptogenic stroke indication  . KNEE ARTHROSCOPY Right   . SHOULDER SURGERY Right    rotator cuff repair and removal of bone spur  . TEE WITHOUT CARDIOVERSION N/A 03/23/2019   Procedure: TRANSESOPHAGEAL ECHOCARDIOGRAM (TEE);  Surgeon: Amy Artist, MD;  Location[facility] ENDOSCOPY;  Service: Cardiovascular;  Laterality: N/A;  . TEMPOROMANDIBULAR JOINT SURGERY Right   . TOTAL KNEE ARTHROPLASTY Right 05/05/2018   Procedure: TOTAL KNEE ARTHROPLASTY;  Surgeon: Amy Civil, MD;  Location: [facility]S;  Service: Orthopedics;  Laterality: Right;    SOCIAL HISTORY:  Social History   Socioeconomic History  . Marital status: Divorced    Spouse name: Not on file  . Number of children: Not on file  . Years of education: Not on file  . Highest education level: Not on file  Occupational History  . Occupation: Nurse    Comment: Retired  Tobacco Use  . Smoking status: Former Smoker    Packs/day: 0.50    Years: 40.00    Pack years: 20.00    Types: Cigarettes    Quit date: 09/04/1995    Years since quitting: 24.4    . Smokeless tobacco: Never Used  Vaping Use  . Vaping Use: Never used  Substance and Sexual Activity  . Alcohol use: No  . Drug use: No    Types: Benzodiazepines, Hydrocodone  . Sexual activity: Never  Other Topics Concern  . Not on file  Social History Narrative   Divorced.  Former smoker, quit in 1997.  Nondrinker.    Social Determinants of Health   Financial Resource Strain:   . Difficulty of Paying Living Expenses:   Food Insecurity:   . Worried About Charity fundraiser in the Last Year:   . Arboriculturist in the Last Year:   Transportation Needs:   . Film/video editor (Medical):   Marland Kitchen Lack of Transportation (Non-Medical):   Physical Activity:   . Days of Exercise per Week:   . Minutes of Exercise per Session:   Stress:   . Feeling of Stress :   Social Connections:   . Frequency of Communication with Friends and Family:   . Frequency of Social Gatherings with Friends and Family:   . Attends Religious Services:   . Active Member of Clubs or Organizations:   .  Attends Archivist Meetings:   Marland Kitchen Marital Status:   Intimate Partner Violence:   . Fear of Current or Ex-Partner:   . Emotionally Abused:   Marland Kitchen Physically Abused:   . Sexually Abused:     FAMILY HISTORY:  Family History  Problem Relation Age of Onset  . Anxiety disorder Mother   . Depression Mother   . Depression Maternal Grandmother   . Alcohol abuse Brother   . [name] - Other Sister   . ADD / ADHD Neg Hx   . Bipolar disorder Neg Hx   . Dementia Neg Hx   . Drug abuse Neg Hx   . OCD Neg Hx   . Paranoid behavior Neg Hx   . Schizophrenia Neg Hx   . Seizures Neg Hx   . Sexual abuse Neg Hx   . Physical abuse Neg Hx     CURRENT MEDICATIONS:  Current Outpatient Medications  Medication Sig Dispense Refill  . ALPRAZolam (XANAX) 0.5 MG tablet Take 1 tablet (0.5 mg total) by mouth at bedtime. 30 tablet 2  . aspirin EC 81 MG tablet Take 81 mg by mouth every morning.    Marland Kitchen atorvastatin  (LIPITOR) 40 MG tablet Take 1 tablet (40 mg total) by mouth daily at 6 PM. 90 tablet 2  . calcium carbonate (OSCAL) 1500 (600 Ca) MG TABS tablet Take 600 mg of elemental calcium by mouth daily with supper.     . cholecalciferol (VITAMIN D) 1000 units tablet Take 1 tablet (1,000 Units total) by mouth daily. 30 tablet 11  . cyanocobalamin (,VITAMIN B-12,) 1000 MCG/ML injection Daily for 4 days; then weekly for 1 month and then monthly after that. 12 mL 0  . donepezil (ARICEPT) 5 MG tablet Take 5 mg by mouth daily.    Marland Kitchen escitalopram (LEXAPRO) 20 MG tablet Take 1 tablet (20 mg total) by mouth daily. 30 tablet 2  . gabapentin (NEURONTIN) 300 MG capsule Take 1 capsule (300 mg total) by mouth at bedtime. 30 capsule 2  . letrozole (FEMARA) 2.5 MG tablet TAKE 1 TABLET(2.5 MG) BY MOUTH DAILY 90 tablet 3  . metFORMIN (GLUCOPHAGE) 500 MG tablet Take 1 tablet (500 mg total) by mouth 2 (two) times daily with a meal. 60 tablet 0  . metoprolol succinate (TOPROL XL) 25 MG 24 hr tablet Take 1 tablet (25 mg total) by mouth daily. 30 tablet 11  . oxybutynin (DITROPAN) 5 MG tablet Take 5 mg by mouth at bedtime.    . risperiDONE (RISPERDAL) 1 MG tablet Take 1 tablet (1 mg total) by mouth at bedtime. 30 tablet 2  . traZODone (DESYREL) 50 MG tablet Take 1 tablet (50 mg total) by mouth at bedtime. 30 tablet 2  . venlafaxine XR (EFFEXOR-XR) 150 MG 24 hr capsule TAKE 1 CAPSULE(150 MG) BY MOUTH TWICE DAILY 60 capsule 2  . ibuprofen (ADVIL) 800 MG tablet Take 800 mg by mouth 3 (three) times daily as needed. (Patient not taking: Reported on 02/16/2020)    . losartan (COZAAR) 25 MG tablet Take 0.5 tablets (12.5 mg total) by mouth at bedtime. 45 tablet 3   No current facility-administered medications for this visit.    ALLERGIES:  Allergies  Allergen Reactions  . Cymbalta [Duloxetine Hcl] Other (See Comments)    Hallucinated and couldn't think; got worse and worse the longer she took this.    PHYSICAL EXAM:  Performance  status (ECOG): 1 - Symptomatic but completely ambulatory  Vitals:   02/16/20 1551  BP: 131/75  Pulse: 68  Resp: 18  Temp: (!) 96.9 F (36.1 C)  SpO2: 92%   Wt Readings from Last 3 Encounters:  02/16/20 164 lb (74.4 kg)  01/04/20 160 lb 9.6 oz (72.8 kg)  12/11/19 160 lb (72.6 kg)   Physical Exam Vitals reviewed.  Constitutional:      Appearance: Normal appearance.  Cardiovascular:     Rate and Rhythm: Normal rate and regular rhythm.     Pulses: Normal pulses.     Heart sounds: Normal heart sounds.  Pulmonary:     Effort: Pulmonary effort is normal.     Breath sounds: Normal breath sounds.  Abdominal:     Palpations: Abdomen is soft. There is no mass.     Tenderness: There is no abdominal tenderness.  Musculoskeletal:     Right lower leg: Edema (1+) present.     Left lower leg: Edema (1+) present.  Lymphadenopathy:     Upper Body:     Right upper body: No axillary adenopathy.     Lower Body: Left inguinal adenopathy (sub-cm) present.  Neurological:     General: No focal deficit present.     Mental Status: She is alert and oriented to person, place, and time.  Psychiatric:        Mood and Affect: Mood normal.        Behavior: Behavior normal.      LABORATORY DATA:  I have reviewed the labs as listed.  CBC Latest Ref Rng & Units 12/30/2019 12/11/2019 10/06/2019  WBC 4.0 - 10.5 K/uL 7.0 6.0 7.9  Hemoglobin 12.0 - 15.0 g/dL 12.9 11.1(L) 12.6  Hematocrit 36 - 46 % 39.7 34.1(L) 39.4  Platelets 150 - 400 K/uL 234 161 208   CMP Latest Ref Rng & Units 12/30/2019 12/11/2019 10/07/2019  Glucose 70 - 99 mg/dL 173(H) 78 112(H)  BUN 8 - 23 mg/dL _0 Creatinine 0.44 - 1.00 mg/dL 0.84 0.76 0.81  Sodium 135 - 145 mmol/L 137 140 140  Potassium 3.5 - 5.1 mmol/L 4.0 3.9 4.4  Chloride 98 - 111 mmol/L 97(L) 104 102  CO2 22 - 32 mmol/L _1 Calcium 8.9 - 10.3 mg/dL 9.3 8.8(L) 8.8(L)  Total Protein 6.5 - 8.1 g/dL 6.5 - -  Total Bilirubin 0.3 - 1.2 mg/dL 0.5 - -  Alkaline  Phos 38 - 126 U/L 76 - -  AST 15 - 41 U/L 23 - -  ALT 0 - 44 U/L 15 - -   Lab Results  Component Value Date   CA2729 19.2 02/14/2020   Lab Results  Component Value Date   CEA1 2.6 02/14/2020   Lab Results  Component Value Date   LDH 171 12/30/2019   LDH 166 06/08/2018   LDH 184 12/04/2017      DIAGNOSTIC IMAGING:  I have independently reviewed the scans and discussed with the patient. NM PET Image Initial (PI) Skull Base To Thigh  Result Date: 02/15/2020 CLINICAL DATA:  Initial treatment strategy for pathologic adenopathy in the pelvis and lower abdomen. History of breast [name]. Right chest pain. EXAM: NUCLEAR MEDICINE PET SKULL BASE TO THIGH TECHNIQUE: 10.5 mCi F-18 FDG was injected intravenously. Full-ring PET imaging was performed from the skull base to thigh after the radiotracer. CT data was obtained and used for attenuation correction and anatomic localization. Fasting blood glucose: 84 mg/dl COMPARISON:  CT scan 12/11/2019 FINDINGS: Mediastinal blood pool activity: SUV max 2.5 Liver activity: SUV max 3.2 NECK:  Accentuated perioral activity is likely dental in nature. No worrisome adenopathy or lesion in the neck. Incidental CT findings: none CHEST: 0.5 cm right axillary lymph node on image 97/3, maximum SUV 4.8 (Deauville 4). Incidental CT findings: Coronary, aortic arch, and branch vessel atherosclerotic vascular disease. Mild scarring in the left upper lobe. Mild scarring in the posterior basal segments of both lower lobes. Stable mild aneurysmal dilatation of the descending thoracic aorta, no change in follow up recommendations. ABDOMEN/PELVIS: Hypermetabolic lower retroperitoneal, common iliac, external iliac, left obturator, left pelvic sidewall, left sciatic notch, and left inguinal adenopathy. Index left upper inguinal lymph node 2.4 cm in short axis on image 263/3 (previously 1.8 cm), maximum SUV 11.9, Deauville 5. A left lower inguinal lymph node measures 2.7 cm in short  axis on image 293/3 (formerly only partially included), with maximum SUV 13.4 con Deauville 5. A left pelvic sidewall lymph node measures 1.7 cm in short axis on image 263/3 (formerly 1.6 cm) with maximum SUV 12.1, Deauville 5. A retrocaval node measuring 0.9 cm in short axis on image 208/3 has maximum SUV of 6.9, Deauville 4. An index right common iliac node measuring 1.2 cm in short axis on image 236/3 (formerly 1.0 cm) has a maximum SUV of 8.9, Deauville 5. There is some scattered multifocal accentuated activity in bowel without a definite obvious primary mass, much of this may well be physiologic. Incidental CT findings: Aortoiliac atherosclerotic vascular disease. No splenomegaly or focal splenic lesion. Sigmoid colon diverticulosis. SKELETON: No significant abnormal hypermetabolic activity in this region. Incidental CT findings: Posterolateral rod and pedicle screw fixation in the lower thoracic spine and lumbar spine region. Chronic appearing anterolisthesis at L4-5 with prior L5 compression. Healing fractures of the right anterior fifth, sixth, seventh, and eighth ribs. IMPRESSION: 1. Hypermetabolic and pathologic adenopathy in the pelvis (eccentric to the left) and in the lower abdomen. There is also a small but mildly hypermetabolic right axillary lymph node. Possibilities include lymphoma or occult primary. Tissue sampling is recommended. 2. Other imaging findings of potential clinical significance: Aortic Atherosclerosis (ICD10-I70.0). Coronary atherosclerosis. Colonic diverticulosis. Healing fractures of the right fifth through eighth ribs. Electronically Signed   By: Van Clines M.D.   On: 02/15/2020 12:27   CUP PACEART REMOTE DEVICE CHECK  Result Date: 02/01/2020 Carelink summary report received. Battery status OK. Normal device function. No new symptom episodes, tachy episodes, brady, or pause episodes. No new AF episodes. Monthly summary reports and ROV/PRN    ASSESSMENT:  1.Stage Ia  right breast [name]: -Status post lumpectomy with Amy. Arnoldo Morale on 10/09/2016.  Pathology showed IDC of right breast, ER/PR positive, HER-2 negative. -Did not receive radiation.  Started on anastrozole in March 2018.  She did not take anastrozole from 05/17/2017 through 10/15/2017 likely from intolerance. -She was subsequently started on letrozole 2.5 mg daily. -Mammogram on 12/07/2019 was BI-RADS Category 2.  2.Osteopenia: -DEXA scan on 11/26/2017 shows T score of -1.7. -Prolia every 6 months started on 12/19/2016.  3.  Hypermetabolic retroperitoneal, pelvic and left inguinal adenopathy: -PET scan on 02/14/2020 shows hypermetabolic lower retroperitoneal, common iliac and external iliac, left obturator, left pelvic sidewall, left sciatic notch and left inguinal adenopathy.  Left upper inguinal lymph node measures 2.4 cm in short axis, SUV 11.9.  Left lower inguinal lymph node SUV 13.4.  Left pelvic sidewall lymph node SUV 12.1.   PLAN:  1.Stage Ia right breast [name]: -Continue letrozole.  She is tolerating reasonably well. -Tumor markers on 02/14/2020 were negative.  2.Osteopenia: -  Last Prolia was on 01/04/2020.  Calcium is normal.  3.  Retroperitoneal and pelvic adenopathy: -We reviewed results of the PET scan and images.  We talked about the differential diagnosis including lymphoma. -We reviewed tumor markers within normal limits. -She does not have any B symptoms at this time. -She does have chronic back pain which is stable. -I have recommended left inguinal lymph node biopsy by Amy. Arnoldo Morale. -I plan to see her back after the biopsy.   Orders placed this encounter:  No orders of the defined types were placed in this encounter.    Derek Jack, MD [place] [phone]   I, Milinda Antis, am acting as a scribe for Amy. Sanda Linger.  I, Derek Jack MD, have reviewed the above documentation for accuracy and completeness, and I agree  with the above.

## 2020-03-06 ENCOUNTER — Ambulatory Visit (INDEPENDENT_AMBULATORY_CARE_PROVIDER_SITE_OTHER): Payer: Medicare HMO | Admitting: *Deleted

## 2020-03-06 ENCOUNTER — Ambulatory Visit: Payer: Medicare HMO | Admitting: Adult Health

## 2020-03-06 DIAGNOSIS — I639 Cerebral infarction, unspecified: Secondary | ICD-10-CM

## 2020-03-06 LAB — CUP PACEART REMOTE DEVICE CHECK
Date Time Interrogation Session: 20210808024824
Implantable Pulse Generator Implant Date: 20210325

## 2020-03-07 ENCOUNTER — Other Ambulatory Visit: Payer: Self-pay

## 2020-03-07 ENCOUNTER — Ambulatory Visit (INDEPENDENT_AMBULATORY_CARE_PROVIDER_SITE_OTHER): Payer: Medicare HMO | Admitting: General Surgery

## 2020-03-07 ENCOUNTER — Encounter: Payer: Self-pay | Admitting: General Surgery

## 2020-03-07 VITALS — BP 124/77 | HR 71 | Temp 98.0°F | Resp 14 | Ht 64.0 in | Wt 159.0 lb

## 2020-03-07 DIAGNOSIS — R599 Enlarged lymph nodes, unspecified: Secondary | ICD-10-CM

## 2020-03-07 NOTE — Progress Notes (Signed)
Amy [name]; [id number]; 14-May-1936   HPI Patient is an 84 year old white female who was referred to my care by Dr. Delton Coombes for lymph node biopsy.  Patient has developed generalized lymphadenopathy and there is a concern that she may have a lymphoma.  She does have left inguinal lymphadenopathy that is accessible.  She has multiple comorbidities and ambulates with a walker.  She denies any fever or chills.  She does have evidence of deconditioning. Past Medical History:  Diagnosis Date   Anxiety    Arthritis    Back pain    Colon polyps    Depression    Diabetes mellitus, type II ([place])    diet controlled   Hypercholesterolemia    Invasive ductal carcinoma of breast, female, right ([place])    Osteopenia 11/26/2016   Skin-picking disorder    has 3 open wounds on right wrist that are being treated. About a quarter in size.   Stroke [facility])     Past Surgical History:  Procedure Laterality Date   APPENDECTOMY     BACK SURGERY     BREAST BIOPSY Right 10/09/2016   Procedure: BREAST BIOPSY WITH NEEDLE LOCALIZATION;  Surgeon: [name], MD;  Location: [facility];  Service: General;  Laterality: Right;   BREAST SURGERY Right    BUBBLE STUDY  03/23/2019   Procedure: BUBBLE STUDY;  Surgeon: [name], MD;  Location: [facility] ENDOSCOPY;  Service: Cardiovascular;;   BUNIONECTOMY Bilateral    FACIAL COSMETIC SURGERY     drooping eyelid and brow   HERNIA REPAIR Right    Inguinal x2   implantable loop recorder placement  10/21/2019    Medtronic Reveal Linq model LNQ 11 ([id number]) implanted by Dr [name] for cryptogenic stroke indication   KNEE ARTHROSCOPY Right    SHOULDER SURGERY Right    rotator cuff repair and removal of bone spur   TEE WITHOUT CARDIOVERSION N/A 03/23/2019   Procedure: TRANSESOPHAGEAL ECHOCARDIOGRAM (TEE);  Surgeon: [name], MD;  Location: [facility] ENDOSCOPY;  Service: Cardiovascular;  Laterality: N/A;   TEMPOROMANDIBULAR JOINT SURGERY Right     TOTAL KNEE ARTHROPLASTY Right 05/05/2018   Procedure: TOTAL KNEE ARTHROPLASTY;  Surgeon: [name], MD;  Location: [facility]S;  Service: Orthopedics;  Laterality: Right;    Family History  Problem Relation Age of Onset   Anxiety disorder Mother    Depression Mother    Depression Maternal Grandmother    Alcohol abuse Brother    Cancer - Other Sister    ADD / ADHD Neg Hx    Bipolar disorder Neg Hx    Dementia Neg Hx    Drug abuse Neg Hx    OCD Neg Hx    Paranoid behavior Neg Hx    Schizophrenia Neg Hx    Seizures Neg Hx    Sexual abuse Neg Hx    Physical abuse Neg Hx     Current Outpatient Medications on File Prior to Visit  Medication Sig Dispense Refill   ALPRAZolam (XANAX) 0.5 MG tablet Take 1 tablet (0.5 mg total) by mouth at bedtime. 30 tablet 2   aspirin EC 81 MG tablet Take 81 mg by mouth every morning.     atorvastatin (LIPITOR) 40 MG tablet Take 1 tablet (40 mg total) by mouth daily at 6 PM. 90 tablet 2   calcium carbonate (OSCAL) 1500 (600 Ca) MG TABS tablet Take 600 mg of elemental calcium by mouth daily with supper.      cholecalciferol (VITAMIN D) 1000  units tablet Take 1 tablet (1,000 Units total) by mouth daily. 30 tablet 11   cyanocobalamin (,VITAMIN B-12,) 1000 MCG/ML injection Daily for 4 days; then weekly for 1 month and then monthly after that. 12 mL 0   donepezil (ARICEPT) 5 MG tablet Take 5 mg by mouth daily.     escitalopram (LEXAPRO) 20 MG tablet Take 1 tablet (20 mg total) by mouth daily. 30 tablet 2   gabapentin (NEURONTIN) 300 MG capsule Take 1 capsule (300 mg total) by mouth at bedtime. 30 capsule 2   letrozole (FEMARA) 2.5 MG tablet TAKE 1 TABLET(2.5 MG) BY MOUTH DAILY 90 tablet 3   metFORMIN (GLUCOPHAGE) 500 MG tablet Take 1 tablet (500 mg total) by mouth 2 (two) times daily with a meal. 60 tablet 0   metoprolol succinate (TOPROL XL) 25 MG 24 hr tablet Take 1 tablet (25 mg total) by mouth daily. 30 tablet 11    oxybutynin (DITROPAN) 5 MG tablet Take 5 mg by mouth at bedtime.     risperiDONE (RISPERDAL) 1 MG tablet Take 1 tablet (1 mg total) by mouth at bedtime. 30 tablet 2   traZODone (DESYREL) 50 MG tablet Take 1 tablet (50 mg total) by mouth at bedtime. 30 tablet 2   venlafaxine XR (EFFEXOR-XR) 150 MG 24 hr capsule TAKE 1 CAPSULE(150 MG) BY MOUTH TWICE DAILY 60 capsule 2   losartan (COZAAR) 25 MG tablet Take 0.5 tablets (12.5 mg total) by mouth at bedtime. 45 tablet 3   No current facility-administered medications on file prior to visit.    Allergies  Allergen Reactions   Cymbalta [Duloxetine Hcl] Other (See Comments)    Hallucinated and couldn't think; got worse and worse the longer she took this.    Social History   Substance and Sexual Activity  Alcohol Use No    Social History   Tobacco Use  Smoking Status Former Smoker   Packs/day: 0.50   Years: 40.00   Pack years: 20.00   Types: Cigarettes   Quit date: 09/04/1995   Years since quitting: 24.5  Smokeless Tobacco Never Used    Review of Systems  Constitutional: Positive for malaise/fatigue.  HENT: Negative.   Eyes: Negative.   Respiratory: Negative.   Cardiovascular: Negative.   Gastrointestinal: Negative.   Musculoskeletal: Positive for back pain.  Skin: Negative.   Neurological: Negative.   Endo/Heme/Allergies: Negative.   Psychiatric/Behavioral: Positive for memory loss.    Objective   Vitals:   03/07/20 1341  BP: 124/77  Pulse: 71  Resp: 14  Temp: 98 F (36.7 C)  SpO2: 94%    Physical Exam Vitals reviewed.  Constitutional:      Appearance: She is not ill-appearing.  HENT:     Head: Normocephalic and atraumatic.  Cardiovascular:     Rate and Rhythm: Normal rate and regular rhythm.     Heart sounds: Normal heart sounds. No murmur heard.  No friction rub. No gallop.   Pulmonary:     Effort: Pulmonary effort is normal. No respiratory distress.     Breath sounds: Normal breath sounds. No  stridor. No wheezing, rhonchi or rales.  Abdominal:     General: There is no distension.     Palpations: Abdomen is soft.     Tenderness: There is no abdominal tenderness. There is no guarding.     Comments: Left inguinal region with 2 large easily palpable lymph nodes, one above the groin crease and one below the groin crease.  Skin:  General: Skin is warm and dry.  Neurological:     Mental Status: She is alert and oriented to person, place, and time.    Dr. Tomie China notes reviewed PET scan images personally reviewed Assessment  Lymphadenopathy of unknown etiology Plan   Patient is scheduled for left inguinal lymph node biopsy on 03/10/2020.  The risks and benefits of the procedure including bleeding, infection, and the possibility of malignancy were fully explained to the patient, who gave informed consent.

## 2020-03-07 NOTE — Patient Instructions (Signed)
Amy [name]  03/07/2020     @[email or web address] @   Your procedure is scheduled on  03/10/2020.  Report to [facility] at  1030  A.M.  Call this number if you have problems the morning of surgery:  [phone]   Remember:  Do not eat or drink after midnight.                      Take these medicines the morning of surgery with A SIP OF WATER  Aricept, lexapro, metoprolol, effexor.    Do not wear jewelry, make-up or nail polish.  Do not wear lotions, powders, or perfumes, or deodorant.  Do not shave 48 hours prior to surgery.  Men may shave face and neck.  Do not bring valuables to the hospital.  [facility] is not responsible for any belongings or valuables.  Contacts, dentures or bridgework may not be worn into surgery.  Leave your suitcase in the car.  After surgery it may be brought to your room.  For patients admitted to the hospital, discharge time will be determined by your treatment team.  Patients discharged the day of surgery will not be allowed to drive home.   Name and phone number of your driver:   family Special instructions:  DO NOT smoke the morning of your procedure.  Please read over the following fact sheets that you were given. Anesthesia Post-op Instructions and Care and Recovery After Surgery       Open Lymph Node Biopsy, Care After This sheet gives you information about how to care for yourself after your procedure. Your health care provider may also give you more specific instructions. If you have problems or questions, contact your health care provider. What can I expect after the procedure? After the procedure, it is common to have:  Bruising.  Soreness.  Mild swelling. Follow these instructions at home: Medicines  Take over-the-counter and prescription medicines only as told by your health care provider.  If you were prescribed an antibiotic medicine, take it as told by your health care provider. Do not stop taking the  antibiotic even if you start to feel better. Incision care   Follow instructions from your health care provider about how to take care of your incision. Make sure you: ? Wash your hands with soap and water before and after you change your bandage (dressing). If soap and water are not available, use hand sanitizer. ? Change your dressing as told by your health care provider. ? Leave stitches (sutures), skin glue, or adhesive strips in place. These skin closures may need to stay in place for 2 weeks or longer. If adhesive strip edges start to loosen and curl up, you may trim the loose edges. Do not remove adhesive strips completely unless your health care provider tells you to do that.  Check your incision area every day for signs of infection. Check for: ? More redness, swelling, or pain. ? Fluid or blood. ? Warmth. ? Pus or a bad smell. Driving  Do not drive for 24 hours if you were given a sedative during your procedure.  Do not drive or use heavy machinery while taking prescription pain medicine. General instructions  Return to your normal activities as told by your health care provider. Ask your health care provider what activities are safe for you.  Do not take baths, swim, or use a hot tub until your health care provider approves. Ask  your health care provider if you may take showers. You may only be allowed to take sponge baths.  Keep all follow-up visits as told by your health care provider. This is important. Contact a health care provider if:  You have more redness, swelling, or pain around your incision.  You have fluid or blood coming from your incision.  Your incision feels warm to the touch.  You have pus or a bad smell coming from your incision.  You have a fever.  You have pain or numbness that gets worse or lasts longer than a few days. Summary  After a lymph node biopsy, it is common to have bruising, soreness, and mild swelling.  Follow your health care  provider's instructions about taking care of yourself at home. You will be told how to take medicines, take care of your incision, and check for infection.  Return to your normal activities as told by your health care provider. Ask your health care provider what activities are safe for you.  Contact a health care provider if you have more redness, swelling, or pain around your incision, you have a fever, or you have worsening pain or numbness. This information is not intended to replace advice given to you by your health care provider. Make sure you discuss any questions you have with your health care provider. Document Revised: 02/19/2018 Document Reviewed: 02/19/2018 Elsevier Patient Education  2020 [place] Anesthesia, Adult, Care After This sheet gives you information about how to care for yourself after your procedure. Your health care provider may also give you more specific instructions. If you have problems or questions, contact your health care provider. What can I expect after the procedure? After the procedure, the following side effects are common:  Pain or discomfort at the IV site.  Nausea.  Vomiting.  Sore throat.  Trouble concentrating.  Feeling cold or chills.  Weak or tired.  Sleepiness and fatigue.  Soreness and body aches. These side effects can affect parts of the body that were not involved in surgery. Follow these instructions at home:  For at least 24 hours after the procedure:  Have a responsible adult stay with you. It is important to have someone help care for you until you are awake and alert.  Rest as needed.  Do not: ? Participate in activities in which you could fall or become injured. ? Drive. ? Use heavy machinery. ? Drink alcohol. ? Take sleeping pills or medicines that cause drowsiness. ? Make important decisions or sign legal documents. ? Take care of children on your own. Eating and drinking  Follow any instructions  from your health care provider about eating or drinking restrictions.  When you feel hungry, start by eating small amounts of foods that are soft and easy to digest (bland), such as toast. Gradually return to your regular diet.  Drink enough fluid to keep your urine pale yellow.  If you vomit, rehydrate by drinking water, juice, or clear broth. General instructions  If you have sleep apnea, surgery and certain medicines can increase your risk for breathing problems. Follow instructions from your health care provider about wearing your sleep device: ? Anytime you are sleeping, including during daytime naps. ? While taking prescription pain medicines, sleeping medicines, or medicines that make you drowsy.  Return to your normal activities as told by your health care provider. Ask your health care provider what activities are safe for you.  Take over-the-counter and prescription medicines only as told  by your health care provider.  If you smoke, do not smoke without supervision.  Keep all follow-up visits as told by your health care provider. This is important. Contact a health care provider if:  You have nausea or vomiting that does not get better with medicine.  You cannot eat or drink without vomiting.  You have pain that does not get better with medicine.  You are unable to pass urine.  You develop a skin rash.  You have a fever.  You have redness around your IV site that gets worse. Get help right away if:  You have difficulty breathing.  You have chest pain.  You have blood in your urine or stool, or you vomit blood. Summary  After the procedure, it is common to have a sore throat or nausea. It is also common to feel tired.  Have a responsible adult stay with you for the first 24 hours after general anesthesia. It is important to have someone help care for you until you are awake and alert.  When you feel hungry, start by eating small amounts of foods that are soft  and easy to digest (bland), such as toast. Gradually return to your regular diet.  Drink enough fluid to keep your urine pale yellow.  Return to your normal activities as told by your health care provider. Ask your health care provider what activities are safe for you. This information is not intended to replace advice given to you by your health care provider. Make sure you discuss any questions you have with your health care provider. Document Revised: 07/18/2017 Document Reviewed: 02/28/2017 Elsevier Patient Education  [place]. How to Use Chlorhexidine for Bathing Chlorhexidine gluconate (CHG) is a germ-killing (antiseptic) solution that is used to clean the skin. It can get rid of the bacteria that normally live on the skin and can keep them away for about 24 hours. To clean your skin with CHG, you may be given:  A CHG solution to use in the shower or as part of a sponge bath.  A prepackaged cloth that contains CHG. Cleaning your skin with CHG may help lower the risk for infection:  While you are staying in the intensive care unit of the hospital.  If you have a vascular access, such as a central line, to provide short-term or long-term access to your veins.  If you have a catheter to drain urine from your bladder.  If you are on a ventilator. A ventilator is a machine that helps you breathe by moving air in and out of your lungs.  After surgery. What are the risks? Risks of using CHG include:  A skin reaction.  Hearing loss, if CHG gets in your ears.  Eye injury, if CHG gets in your eyes and is not rinsed out.  The CHG product catching fire. Make sure that you avoid smoking and flames after applying CHG to your skin. Do not use CHG:  If you have a chlorhexidine allergy or have previously reacted to chlorhexidine.  On babies younger than 14 months of age. How to use CHG solution  Use CHG only as told by your health care provider, and follow the instructions on  the label.  Use the full amount of CHG as directed. Usually, this is one bottle. During a shower Follow these steps when using CHG solution during a shower (unless your health care provider gives you different instructions): 1. Start the shower. 2. Use your normal soap and shampoo to  wash your face and hair. 3. Turn off the shower or move out of the shower stream. 4. Pour the CHG onto a clean washcloth. Do not use any type of brush or rough-edged sponge. 5. Starting at your neck, lather your body down to your toes. Make sure you follow these instructions: ? If you will be having surgery, pay special attention to the part of your body where you will be having surgery. Scrub this area for at least 1 minute. ? Do not use CHG on your head or face. If the solution gets into your ears or eyes, rinse them well with water. ? Avoid your genital area. ? Avoid any areas of skin that have broken skin, cuts, or scrapes. ? Scrub your back and under your arms. Make sure to wash skin folds. 6. Let the lather sit on your skin for 1-2 minutes or as long as told by your health care provider. 7. Thoroughly rinse your entire body in the shower. Make sure that all body creases and crevices are rinsed well. 8. Dry off with a clean towel. Do not put any substances on your body afterward--such as powder, lotion, or perfume--unless you are told to do so by your health care provider. Only use lotions that are recommended by the manufacturer. 9. Put on clean clothes or pajamas. 10. If it is the night before your surgery, sleep in clean sheets.  During a sponge bath Follow these steps when using CHG solution during a sponge bath (unless your health care provider gives you different instructions): 1. Use your normal soap and shampoo to wash your face and hair. 2. Pour the CHG onto a clean washcloth. 3. Starting at your neck, lather your body down to your toes. Make sure you follow these instructions: ? If you will be  having surgery, pay special attention to the part of your body where you will be having surgery. Scrub this area for at least 1 minute. ? Do not use CHG on your head or face. If the solution gets into your ears or eyes, rinse them well with water. ? Avoid your genital area. ? Avoid any areas of skin that have broken skin, cuts, or scrapes. ? Scrub your back and under your arms. Make sure to wash skin folds. 4. Let the lather sit on your skin for 1-2 minutes or as long as told by your health care provider. 5. Using a different clean, wet washcloth, thoroughly rinse your entire body. Make sure that all body creases and crevices are rinsed well. 6. Dry off with a clean towel. Do not put any substances on your body afterward--such as powder, lotion, or perfume--unless you are told to do so by your health care provider. Only use lotions that are recommended by the manufacturer. 7. Put on clean clothes or pajamas. 8. If it is the night before your surgery, sleep in clean sheets. How to use CHG prepackaged cloths  Only use CHG cloths as told by your health care provider, and follow the instructions on the label.  Use the CHG cloth on clean, dry skin.  Do not use the CHG cloth on your head or face unless your health care provider tells you to.  When washing with the CHG cloth: ? Avoid your genital area. ? Avoid any areas of skin that have broken skin, cuts, or scrapes. Before surgery Follow these steps when using a CHG cloth to clean before surgery (unless your health care provider gives you different instructions): 1.  Using the CHG cloth, vigorously scrub the part of your body where you will be having surgery. Scrub using a back-and-forth motion for 3 minutes. The area on your body should be completely wet with CHG when you are done scrubbing. 2. Do not rinse. Discard the cloth and let the area air-dry. Do not put any substances on the area afterward, such as powder, lotion, or perfume. 3. Put on  clean clothes or pajamas. 4. If it is the night before your surgery, sleep in clean sheets.  For general bathing Follow these steps when using CHG cloths for general bathing (unless your health care provider gives you different instructions). 1. Use a separate CHG cloth for each area of your body. Make sure you wash between any folds of skin and between your fingers and toes. Wash your body in the following order, switching to a new cloth after each step: ? The front of your neck, shoulders, and chest. ? Both of your arms, under your arms, and your hands. ? Your stomach and groin area, avoiding the genitals. ? Your right leg and foot. ? Your left leg and foot. ? The back of your neck, your back, and your buttocks. 2. Do not rinse. Discard the cloth and let the area air-dry. Do not put any substances on your body afterward--such as powder, lotion, or perfume--unless you are told to do so by your health care provider. Only use lotions that are recommended by the manufacturer. 3. Put on clean clothes or pajamas. Contact a health care provider if:  Your skin gets irritated after scrubbing.  You have questions about using your solution or cloth. Get help right away if:  Your eyes become very red or swollen.  Your eyes itch badly.  Your skin itches badly and is red or swollen.  Your hearing changes.  You have trouble seeing.  You have swelling or tingling in your mouth or throat.  You have trouble breathing.  You swallow any chlorhexidine. Summary  Chlorhexidine gluconate (CHG) is a germ-killing (antiseptic) solution that is used to clean the skin. Cleaning your skin with CHG may help to lower your risk for infection.  You may be given CHG to use for bathing. It may be in a bottle or in a prepackaged cloth to use on your skin. Carefully follow your health care provider's instructions and the instructions on the product label.  Do not use CHG if you have a chlorhexidine  allergy.  Contact your health care provider if your skin gets irritated after scrubbing. This information is not intended to replace advice given to you by your health care provider. Make sure you discuss any questions you have with your health care provider. Document Revised: 10/01/2018 Document Reviewed: 06/12/2017 Elsevier Patient Education  [place].

## 2020-03-07 NOTE — Progress Notes (Signed)
[facility] Summary Report / Loop Recorder 

## 2020-03-07 NOTE — Patient Instructions (Signed)
Open Lymph Node Biopsy °An open lymph node biopsy is a procedure to remove a lymph node so that it can be examined under a microscope. Lymph nodes are part of the body's disease-fighting system (immune system). The immune system protects the body from infections, germs, and diseases. An open lymph node biopsy may be done to: °· Look for germs or cancer cells in your lymph node. °· Find out why your lymph node is swollen. °· Find out more about a condition you have. °Lymph nodes are found in many locations in the body. Biopsies are often done on lymph nodes in the head, neck, armpit, or groin. °Tell a health care provider about: °· Any allergies you have. °· All medicines you are taking, including vitamins, herbs, eye drops, creams, and over-the-counter medicines. °· Any problems you or family members have had with anesthetic medicines. °· Any blood disorders you have. °· Any surgeries you have had. °· Any medical conditions you have or have had. °· Whether you are pregnant or may be pregnant. °What are the risks? °Generally, this is a safe procedure. However, problems may occur, including: °· Infection. °· Bleeding. °· Allergic reactions to medicines. °· Damage to other structures or organs, such as a nerve. °· Scarring. °What happens before the procedure? °Medicines °Ask your health care provider about: °· Changing or stopping your regular medicines. This is especially important if you are taking diabetes medicines or blood thinners. °· Taking medicines such as aspirin and ibuprofen. These medicines can thin your blood. Do not take these medicines unless your health care provider tells you to take them. °· Taking over-the-counter medicines, vitamins, herbs, and supplements. °General instructions °· Follow instructions from your health care provider about eating or drinking restrictions. °· You may have an exam or testing. °· You may have a blood or urine sample taken. °· Plan to have someone take you home from the  hospital or clinic. °· If you will be going home right after the procedure, plan to have someone with you for 24 hours. °· Ask your health care provider how your surgical site will be marked or identified. °· Ask your health care provider what steps will be taken to help prevent infection. These may include: °? Removing hair at the biopsy site. °? Washing skin with a germ-killing soap. °? Taking antibiotic medicine. °What happens during the procedure? ° °· An IV will be inserted into one of your veins. °· You will be given one or more of the following: °? A medicine to help you relax (sedative). °? A medicine to numb the area (local anesthetic). °· An incision will be made in the area where your lymph node is located. °· Your lymph node will be removed. °· Your incision will be closed with stitches (sutures). °· An antibiotic ointment may be applied to your incision. °· A bandage (dressing) will be placed over your incision. °The procedure may vary among health care providers and hospitals. °What happens after the procedure? °· Your blood pressure, heart rate, breathing rate, and blood oxygen level will be monitored until you leave the hospital or clinic. °· Do not drive for 24 hours if you were given a sedative during your procedure. °· It is up to you to get the results of your procedure. Ask your health care provider, or the department that is doing the procedure, when your results will be ready. °Summary °· An open lymph node biopsy is a procedure to remove a lymph node so that   it can be checked for infections, germs, and disease. °· Generally, this is a safe procedure. However, problems may occur, including bleeding, infection, allergic reaction to medicines, and damage to other structures or organs. °· Follow your health care provider's instructions before the procedure. These may include changing or stopping some medicines and restricting what you eat and drink. °· During the procedure, an incision will be  made in the area of the lymph node, the lymph node will be removed, and the incision will be closed with sutures. °· You will be monitored after the procedure. Do not drive for 24 hours if you were given a sedative during your procedure. °This information is not intended to replace advice given to you by your health care provider. Make sure you discuss any questions you have with your health care provider. °Document Revised: 02/19/2018 Document Reviewed: 02/19/2018 °Elsevier Patient Education © 2020 [employer] ° °

## 2020-03-07 NOTE — H&P (Signed)
Amy [name]; [id number]; [birth year]   HPI Patient is an 84 year old white female who was referred to my care by Dr. Delton Coombes for lymph node biopsy.  Patient has developed generalized lymphadenopathy and there is a concern that she may have a lymphoma.  She does have left inguinal lymphadenopathy that is accessible.  She has multiple comorbidities and ambulates with a walker.  She denies any fever or chills.  She does have evidence of deconditioning. Past Medical History:  Diagnosis Date  . Anxiety   . Arthritis   . Back pain   . Colon polyps   . Depression   . Diabetes mellitus, type II ([place])    diet controlled  . Hypercholesterolemia   . Invasive ductal carcinoma of breast, female, right ([place])   . Osteopenia 11/26/2016  . Skin-picking disorder    has 3 open wounds on right wrist that are being treated. About a quarter in size.  . Stroke [facility])     Past Surgical History:  Procedure Laterality Date  . APPENDECTOMY    . BACK SURGERY    . BREAST BIOPSY Right 10/09/2016   Procedure: BREAST BIOPSY WITH NEEDLE LOCALIZATION;  Surgeon: [name], MD;  Location: [facility] ORS;  Service: General;  Laterality: Right;  . BREAST SURGERY Right   . BUBBLE STUDY  03/23/2019   Procedure: BUBBLE STUDY;  Surgeon: [name], MD;  Location: [facility] ENDOSCOPY;  Service: Cardiovascular;;  . BUNIONECTOMY Bilateral   . FACIAL COSMETIC SURGERY     drooping eyelid and brow  . HERNIA REPAIR Right    Inguinal x2  . implantable loop recorder placement  10/21/2019    Medtronic Reveal [place] model [id number] [id number]) implanted by Dr [name] for cryptogenic stroke indication  . KNEE ARTHROSCOPY Right   . SHOULDER SURGERY Right    rotator cuff repair and removal of bone spur  . TEE WITHOUT CARDIOVERSION N/A 03/23/2019   Procedure: TRANSESOPHAGEAL ECHOCARDIOGRAM (TEE);  Surgeon: [name], MD;  Location: [facility] ENDOSCOPY;  Service: Cardiovascular;  Laterality: N/A;  . TEMPOROMANDIBULAR JOINT SURGERY Right    . TOTAL KNEE ARTHROPLASTY Right 05/05/2018   Procedure: TOTAL KNEE ARTHROPLASTY;  Surgeon: [name], MD;  Location: [facility] ORS;  Service: Orthopedics;  Laterality: Right;    Family History  Problem Relation Age of Onset  . Anxiety disorder Mother   . Depression Mother   . Depression Maternal Grandmother   . Alcohol abuse Brother   . Cancer - Other Sister   . ADD / ADHD Neg Hx   . Bipolar disorder Neg Hx   . Dementia Neg Hx   . Drug abuse Neg Hx   . OCD Neg Hx   . Paranoid behavior Neg Hx   . Schizophrenia Neg Hx   . Seizures Neg Hx   . Sexual abuse Neg Hx   . Physical abuse Neg Hx     Current Outpatient Medications on File Prior to Visit  Medication Sig Dispense Refill  . ALPRAZolam (XANAX) 0.5 MG tablet Take 1 tablet (0.5 mg total) by mouth at bedtime. 30 tablet 2  . aspirin EC 81 MG tablet Take 81 mg by mouth every morning.    Marland Kitchen atorvastatin (LIPITOR) 40 MG tablet Take 1 tablet (40 mg total) by mouth daily at 6 PM. 90 tablet 2  . calcium carbonate (OSCAL) 1500 (600 Ca) MG TABS tablet Take 600 mg of elemental calcium by mouth daily with supper.     . cholecalciferol (VITAMIN D) 1000  units tablet Take 1 tablet (1,000 Units total) by mouth daily. 30 tablet 11  . cyanocobalamin (,VITAMIN B-12,) 1000 MCG/ML injection Daily for 4 days; then weekly for 1 month and then monthly after that. 12 mL 0  . donepezil (ARICEPT) 5 MG tablet Take 5 mg by mouth daily.    Marland Kitchen escitalopram (LEXAPRO) 20 MG tablet Take 1 tablet (20 mg total) by mouth daily. 30 tablet 2  . gabapentin (NEURONTIN) 300 MG capsule Take 1 capsule (300 mg total) by mouth at bedtime. 30 capsule 2  . letrozole (FEMARA) 2.5 MG tablet TAKE 1 TABLET(2.5 MG) BY MOUTH DAILY 90 tablet 3  . metFORMIN (GLUCOPHAGE) 500 MG tablet Take 1 tablet (500 mg total) by mouth 2 (two) times daily with a meal. 60 tablet 0  . metoprolol succinate (TOPROL XL) 25 MG 24 hr tablet Take 1 tablet (25 mg total) by mouth daily. 30 tablet 11  .  oxybutynin (DITROPAN) 5 MG tablet Take 5 mg by mouth at bedtime.    . risperiDONE (RISPERDAL) 1 MG tablet Take 1 tablet (1 mg total) by mouth at bedtime. 30 tablet 2  . traZODone (DESYREL) 50 MG tablet Take 1 tablet (50 mg total) by mouth at bedtime. 30 tablet 2  . venlafaxine XR (EFFEXOR-XR) 150 MG 24 hr capsule TAKE 1 CAPSULE(150 MG) BY MOUTH TWICE DAILY 60 capsule 2  . losartan (COZAAR) 25 MG tablet Take 0.5 tablets (12.5 mg total) by mouth at bedtime. 45 tablet 3   No current facility-administered medications on file prior to visit.    Allergies  Allergen Reactions  . Cymbalta [Duloxetine Hcl] Other (See Comments)    Hallucinated and couldn't think; got worse and worse the longer she took this.    Social History   Substance and Sexual Activity  Alcohol Use No    Social History   Tobacco Use  Smoking Status Former Smoker  . Packs/day: 0.50  . Years: 40.00  . Pack years: 20.00  . Types: Cigarettes  . Quit date: 09/04/1995  . Years since quitting: 24.5  Smokeless Tobacco Never Used    Review of Systems  Constitutional: Positive for malaise/fatigue.  HENT: Negative.   Eyes: Negative.   Respiratory: Negative.   Cardiovascular: Negative.   Gastrointestinal: Negative.   Musculoskeletal: Positive for back pain.  Skin: Negative.   Neurological: Negative.   Endo/Heme/Allergies: Negative.   Psychiatric/Behavioral: Positive for memory loss.    Objective   Vitals:   03/07/20 1341  BP: 124/77  Pulse: 71  Resp: 14  Temp: 98 F (36.7 C)  SpO2: 94%    Physical Exam Vitals reviewed.  Constitutional:      Appearance: She is not ill-appearing.  HENT:     Head: Normocephalic and atraumatic.  Cardiovascular:     Rate and Rhythm: Normal rate and regular rhythm.     Heart sounds: Normal heart sounds. No murmur heard.  No friction rub. No gallop.   Pulmonary:     Effort: Pulmonary effort is normal. No respiratory distress.     Breath sounds: Normal breath sounds. No  stridor. No wheezing, rhonchi or rales.  Abdominal:     General: There is no distension.     Palpations: Abdomen is soft.     Tenderness: There is no abdominal tenderness. There is no guarding.     Comments: Left inguinal region with 2 large easily palpable lymph nodes, one above the groin crease and one below the groin crease.  Skin:  General: Skin is warm and dry.  Neurological:     Mental Status: She is alert and oriented to person, place, and time.    Dr. Tomie China notes reviewed PET scan images personally reviewed Assessment  Lymphadenopathy of unknown etiology Plan   Patient is scheduled for left inguinal lymph node biopsy on 03/10/2020.  The risks and benefits of the procedure including bleeding, infection, and the possibility of malignancy were fully explained to the patient, who gave informed consent.

## 2020-03-08 ENCOUNTER — Encounter (HOSPITAL_COMMUNITY): Payer: Self-pay

## 2020-03-08 ENCOUNTER — Other Ambulatory Visit (HOSPITAL_COMMUNITY)
Admission: RE | Admit: 2020-03-08 | Discharge: 2020-03-08 | Disposition: A | Discharge status: Home / Self Care | Payer: Medicare HMO | Source: Ambulatory Visit | Attending: General Surgery | Admitting: General Surgery

## 2020-03-08 ENCOUNTER — Encounter (HOSPITAL_COMMUNITY)
Admission: RE | Admit: 2020-03-08 | Discharge: 2020-03-08 | Disposition: A | Discharge status: Home / Self Care | Payer: Medicare HMO | Source: Ambulatory Visit | Attending: General Surgery | Admitting: General Surgery

## 2020-03-08 DIAGNOSIS — Z01812 Encounter for preprocedural laboratory examination: Secondary | ICD-10-CM | POA: Diagnosis present

## 2020-03-08 DIAGNOSIS — Z20822 Contact with and (suspected) exposure to covid-19: Secondary | ICD-10-CM | POA: Diagnosis not present

## 2020-03-08 HISTORY — DX: Unspecified dementia, unspecified severity, without behavioral disturbance, psychotic disturbance, mood disturbance, and anxiety: F03.90

## 2020-03-08 LAB — BASIC METABOLIC PANEL
Anion gap: 10 (ref 5–15)
BUN: 20 mg/dL (ref 8–23)
CO2: 26 mmol/L (ref 22–32)
Calcium: 9.2 mg/dL (ref 8.9–10.3)
Chloride: 102 mmol/L (ref 98–111)
Creatinine, Ser: 0.8 mg/dL (ref 0.44–1.00)
GFR calc Af Amer: >60 mL/min (ref >60)
GFR calc non Af Amer: >60 mL/min (ref >60)
Glucose, Bld: 74 mg/dL (ref 70–99)
Potassium: 5 mmol/L (ref 3.5–5.1)
Sodium: 138 mmol/L (ref 135–145)

## 2020-03-08 LAB — HEMOGLOBIN A1C
Hgb A1c MFr Bld: 6.1 % — ABNORMAL HIGH (ref 4.8–5.6)
Mean Plasma Glucose: 128.37 mg/dL

## 2020-03-09 LAB — SARS CORONAVIRUS 2 (TAT 6-24 HRS): SARS Coronavirus 2: NEGATIVE

## 2020-03-10 ENCOUNTER — Ambulatory Visit (HOSPITAL_COMMUNITY)
Admission: RE | Admit: 2020-03-10 | Discharge: 2020-03-10 | Disposition: A | Discharge status: Home / Self Care | Payer: Medicare HMO | Attending: General Surgery | Admitting: General Surgery

## 2020-03-10 ENCOUNTER — Encounter (HOSPITAL_COMMUNITY)
Admission: RE | Disposition: A | Discharge status: Home / Self Care | Payer: Self-pay | Source: Home / Self Care | Attending: General Surgery

## 2020-03-10 ENCOUNTER — Ambulatory Visit (HOSPITAL_COMMUNITY): Payer: Medicare HMO | Admitting: Certified Registered Nurse Anesthetist

## 2020-03-10 ENCOUNTER — Encounter (HOSPITAL_COMMUNITY): Payer: Self-pay | Admitting: General Surgery

## 2020-03-10 DIAGNOSIS — R590 Localized enlarged lymph nodes: Secondary | ICD-10-CM | POA: Diagnosis present

## 2020-03-10 DIAGNOSIS — Z853 Personal history of malignant neoplasm of breast: Secondary | ICD-10-CM | POA: Diagnosis not present

## 2020-03-10 DIAGNOSIS — Z8673 Personal history of transient ischemic attack (TIA), and cerebral infarction without residual deficits: Secondary | ICD-10-CM | POA: Diagnosis not present

## 2020-03-10 DIAGNOSIS — F419 Anxiety disorder, unspecified: Secondary | ICD-10-CM | POA: Diagnosis not present

## 2020-03-10 DIAGNOSIS — F329 Major depressive disorder, single episode, unspecified: Secondary | ICD-10-CM | POA: Insufficient documentation

## 2020-03-10 DIAGNOSIS — Z7982 Long term (current) use of aspirin: Secondary | ICD-10-CM | POA: Diagnosis not present

## 2020-03-10 DIAGNOSIS — Z7984 Long term (current) use of oral hypoglycemic drugs: Secondary | ICD-10-CM | POA: Diagnosis not present

## 2020-03-10 DIAGNOSIS — M8580 Other specified disorders of bone density and structure, unspecified site: Secondary | ICD-10-CM | POA: Insufficient documentation

## 2020-03-10 DIAGNOSIS — I509 Heart failure, unspecified: Secondary | ICD-10-CM | POA: Insufficient documentation

## 2020-03-10 DIAGNOSIS — E119 Type 2 diabetes mellitus without complications: Secondary | ICD-10-CM | POA: Diagnosis not present

## 2020-03-10 DIAGNOSIS — Z87891 Personal history of nicotine dependence: Secondary | ICD-10-CM | POA: Insufficient documentation

## 2020-03-10 DIAGNOSIS — M1990 Unspecified osteoarthritis, unspecified site: Secondary | ICD-10-CM | POA: Diagnosis not present

## 2020-03-10 DIAGNOSIS — C8290 Follicular lymphoma, unspecified, unspecified site: Secondary | ICD-10-CM | POA: Diagnosis not present

## 2020-03-10 DIAGNOSIS — E7800 Pure hypercholesterolemia, unspecified: Secondary | ICD-10-CM | POA: Diagnosis not present

## 2020-03-10 DIAGNOSIS — Z79899 Other long term (current) drug therapy: Secondary | ICD-10-CM | POA: Diagnosis not present

## 2020-03-10 HISTORY — PX: INGUINAL LYMPH NODE BIOPSY: SHX5865

## 2020-03-10 LAB — GLUCOSE, CAPILLARY
Glucose-Capillary: 124 mg/dL — ABNORMAL HIGH (ref 70–99)
Glucose-Capillary: 110 mg/dL — ABNORMAL HIGH (ref 70–99)

## 2020-03-10 SURGERY — BIOPSY, LYMPH NODE, INGUINAL, OPEN
Anesthesia: General | Site: Inguinal | Laterality: Left

## 2020-03-10 MED ORDER — LIDOCAINE HCL (PF) 1 % IJ SOLN
INTRAMUSCULAR | Status: AC
  Filled 2020-03-10: qty 30

## 2020-03-10 MED ORDER — FENTANYL CITRATE (PF) 250 MCG/5ML IJ SOLN
INTRAMUSCULAR | Status: AC
  Filled 2020-03-10: qty 5

## 2020-03-10 MED ORDER — FENTANYL CITRATE (PF) 100 MCG/2ML IJ SOLN: 25.0000 ug | INTRAMUSCULAR | Status: DC | PRN

## 2020-03-10 MED ORDER — CHLORHEXIDINE GLUCONATE CLOTH 2 % EX PADS: 6.0000 | MEDICATED_PAD | Freq: Once | CUTANEOUS | Status: DC

## 2020-03-10 MED ORDER — BUPIVACAINE HCL (PF) 0.5 % IJ SOLN
INTRAMUSCULAR | Status: AC
  Filled 2020-03-10: qty 30

## 2020-03-10 MED ORDER — DEXAMETHASONE SODIUM PHOSPHATE 10 MG/ML IJ SOLN
INTRAMUSCULAR | Status: AC
  Filled 2020-03-10: qty 1

## 2020-03-10 MED ORDER — LIDOCAINE 2% (20 MG/ML) 5 ML SYRINGE
INTRAMUSCULAR | Status: AC
  Filled 2020-03-10: qty 5

## 2020-03-10 MED ORDER — ONDANSETRON HCL 4 MG/2ML IJ SOLN: 4.0000 mg | Freq: Once | INTRAMUSCULAR | Status: DC | PRN

## 2020-03-10 MED ORDER — ORAL CARE MOUTH RINSE: 15.0000 mL | Freq: Once | OROMUCOSAL | Status: AC

## 2020-03-10 MED ORDER — 0.9 % SODIUM CHLORIDE (POUR BTL) OPTIME
TOPICAL | Status: DC | PRN
  Administered 2020-03-10: 1000 mL

## 2020-03-10 MED ORDER — PROPOFOL 500 MG/50ML IV EMUL
INTRAVENOUS | Status: DC | PRN
  Administered 2020-03-10: 125 ug/kg/min via INTRAVENOUS

## 2020-03-10 MED ORDER — PHENYLEPHRINE 40 MCG/ML (10ML) SYRINGE FOR IV PUSH (FOR BLOOD PRESSURE SUPPORT)
PREFILLED_SYRINGE | INTRAVENOUS | Status: AC
  Filled 2020-03-10: qty 10

## 2020-03-10 MED ORDER — CHLORHEXIDINE GLUCONATE 0.12 % MT SOLN
15.0000 mL | Freq: Once | OROMUCOSAL | Status: AC
  Administered 2020-03-10: 15 mL via OROMUCOSAL

## 2020-03-10 MED ORDER — ONDANSETRON HCL 4 MG/2ML IJ SOLN
INTRAMUSCULAR | Status: AC
  Filled 2020-03-10: qty 2

## 2020-03-10 MED ORDER — LACTATED RINGERS IV SOLN: INTRAVENOUS | Status: DC

## 2020-03-10 MED ORDER — CEFAZOLIN SODIUM-DEXTROSE 2-4 GM/100ML-% IV SOLN
2.0000 g | INTRAVENOUS | Status: AC
  Administered 2020-03-10: 2 g via INTRAVENOUS
  Filled 2020-03-10: qty 100

## 2020-03-10 MED ORDER — PROPOFOL 10 MG/ML IV BOLUS
INTRAVENOUS | Status: AC
  Filled 2020-03-10: qty 40

## 2020-03-10 MED ORDER — LIDOCAINE HCL (PF) 1 % IJ SOLN
INTRAMUSCULAR | Status: DC | PRN
  Administered 2020-03-10: 6 mL

## 2020-03-10 SURGICAL SUPPLY — 31 items
ADH SKN CLS APL DERMABOND .7 (GAUZE/BANDAGES/DRESSINGS) ×1
CLOTH BEACON ORANGE TIMEOUT ST (SAFETY) ×2 IMPLANT
CONT SPEC 4OZ CLIKSEAL STRL BL (MISCELLANEOUS) ×2 IMPLANT
COVER LIGHT HANDLE STERIS (MISCELLANEOUS) ×4 IMPLANT
COVER WAND RF STERILE (DRAPES) ×2 IMPLANT
DECANTER SPIKE VIAL GLASS SM (MISCELLANEOUS) ×2 IMPLANT
DERMABOND ADVANCED (GAUZE/BANDAGES/DRESSINGS) ×1
DERMABOND ADVANCED .7 DNX12 (GAUZE/BANDAGES/DRESSINGS) ×1 IMPLANT
ELECT REM PT RETURN 9FT ADLT (ELECTROSURGICAL) ×2
ELECTRODE REM PT RTRN 9FT ADLT (ELECTROSURGICAL) ×1 IMPLANT
GLOVE BIO SURGEON STRL SZ7 (GLOVE) ×2 IMPLANT
GLOVE BIOGEL PI IND STRL 7.0 (GLOVE) ×2 IMPLANT
GLOVE BIOGEL PI INDICATOR 7.0 (GLOVE) ×2
GLOVE SURG SS PI 7.5 STRL IVOR (GLOVE) ×2 IMPLANT
GOWN STRL REUS W/TWL LRG LVL3 (GOWN DISPOSABLE) ×4 IMPLANT
KIT TURNOVER KIT A (KITS) ×2 IMPLANT
MANIFOLD NEPTUNE II (INSTRUMENTS) ×2 IMPLANT
NEEDLE HYPO 25X1 1.5 SAFETY (NEEDLE) ×2 IMPLANT
NS IRRIG 1000ML POUR BTL (IV SOLUTION) ×2 IMPLANT
PACK MINOR (CUSTOM PROCEDURE TRAY) ×2 IMPLANT
PAD ARMBOARD 7.5X6 YLW CONV (MISCELLANEOUS) ×2 IMPLANT
SET BASIN LINEN APH (SET/KITS/TRAYS/PACK) ×2 IMPLANT
SHEARS HARMONIC 9CM CVD (BLADE) ×2 IMPLANT
SPONGE INTESTINAL PEANUT (DISPOSABLE) IMPLANT
SUT MNCRL AB 4-0 PS2 18 (SUTURE) ×2 IMPLANT
SUT SILK 2 0 (SUTURE)
SUT SILK 2-0 18XBRD TIE 12 (SUTURE) IMPLANT
SUT VIC AB 3-0 SH 27 (SUTURE) ×4
SUT VIC AB 3-0 SH 27X BRD (SUTURE) ×2 IMPLANT
SUT VICRYL AB 3 0 TIES (SUTURE) IMPLANT
SYR CONTROL 10ML LL (SYRINGE) ×2 IMPLANT

## 2020-03-10 NOTE — Op Note (Signed)
Patient:  Amy [name]  DOB:  1936/07/01  MRN:  [id number]   Preop Diagnosis: Inguinal lymphadenopathy of unknown etiology  Postop Diagnosis: Same  Procedure: Left inguinal lymph node biopsy  Surgeon: [name], MD  Anes: MAC  Indications: Patient is an 84 year old white female with retroperitoneal and inguinal lymphadenopathy who was referred to my care by Dr. Delton Coombes of oncology for a lymph node biopsy to rule out lymphoma.  The risks and benefits of the procedure including bleeding, infection, and pain were fully explained to the patient, who gave informed consent.  Procedure note: The patient was placed in the supine position.  After monitored anesthesia care was given, the left inguinal region was prepped and draped using the usual sterile technique with Betadine.  Surgical site confirmation was performed.  1% Xylocaine was used for local anesthesia.  Incision was made just below the left groin crease over an easily palpable subcutaneous mass.  The dissection was taken down to the significantly enlarged lymph node which measured greater than 3 cm.  The LigaSure was used to free away the lymph node from the surrounding soft tissue.  The specimen was then sent to pathology for flow cytometry.  A bleeding was controlled using the harmonic scalpel.  The soft tissue was closed in layers using 3-0 Vicryl interrupted sutures.  The skin was closed using a 4-0 Monocryl subcuticular suture.  Dermabond was applied.  All tape and needle counts were correct at the end of the procedure.  The patient was transferred to PACU in stable condition.  Complications: None  EBL: Minimal  Specimen: Left inguinal lymph node

## 2020-03-10 NOTE — Interval H&P Note (Signed)
History and Physical Interval Note:  03/10/2020 8:38 AM  Amy [name]  has presented today for surgery, with the diagnosis of lymphadenopathy.  The various methods of treatment have been discussed with the patient and family. After consideration of risks, benefits and other options for treatment, the patient has consented to  Procedure(s) with comments: INGUINAL LYMPH NODE BIOPSY (Left) - spoke w/son, asked them to arrive at 7:50, he states his Mom is slow moving in the AM will do his best but it might be 8 or so before they arrive as a surgical intervention.  The patient's history has been reviewed, patient examined, no change in status, stable for surgery.  I have reviewed the patient's chart and labs.  Questions were answered to the patient's satisfaction.     [name]

## 2020-03-10 NOTE — Transfer of Care (Signed)
Immediate Anesthesia Transfer of Care Note  Patient: Amy [name]  Procedure(s) Performed: LEFT INGUINAL LYMPH NODE BIOPSY (Left Inguinal)  Patient Location: PACU  Anesthesia Type:General  Level of Consciousness: awake  Airway & Oxygen Therapy: Patient Spontanous Breathing and Patient connected to nasal cannula oxygen  Post-op Assessment: Report given to RN and Post -op Vital signs reviewed and stable  Post vital signs: Reviewed and stable  Last Vitals:  Vitals Value Taken Time  BP 153/73 03/10/20 1005  Temp    Pulse 76 03/10/20 1010  Resp 17 03/10/20 1010  SpO2 97 % 03/10/20 1010  Vitals shown include unvalidated device data.  Last Pain: There were no vitals filed for this visit.       Complications: No complications documented.

## 2020-03-10 NOTE — Anesthesia Postprocedure Evaluation (Signed)
Anesthesia Post Note  Patient: Amy [name]  Procedure(s) Performed: LEFT INGUINAL LYMPH NODE BIOPSY (Left Inguinal)  Patient location during evaluation: PACU Anesthesia Type: General Level of consciousness: awake and alert Pain management: pain level controlled Vital Signs Assessment: post-procedure vital signs reviewed and stable Respiratory status: spontaneous breathing Cardiovascular status: stable Postop Assessment: no apparent nausea or vomiting Anesthetic complications: no   No complications documented.   Last Vitals:  Vitals:   03/10/20 1057 03/10/20 1059  BP: (!) 153/73   Pulse: 67   Resp: 15   Temp:  36.6 C  SpO2: 93%     Last Pain:  Vitals:   03/10/20 1059  TempSrc: Oral  PainSc:                  Keneshia Tena Hristova

## 2020-03-10 NOTE — Anesthesia Preprocedure Evaluation (Signed)
Anesthesia Evaluation  Patient identified by MRN, date of birth, ID band Patient awake    Reviewed: Allergy & Precautions, H&P , NPO status , Patient's Chart, lab work & pertinent test results, reviewed documented beta blocker date and time   Airway Mallampati: II  TM Distance: >3 FB Neck ROM: full    Dental no notable dental hx.    Pulmonary neg pulmonary ROS, former smoker,    Pulmonary exam normal breath sounds clear to auscultation       Cardiovascular Exercise Tolerance: Good +CHF   Rhythm:regular Rate:Normal     Neuro/Psych PSYCHIATRIC DISORDERS Anxiety Depression Dementia CVA    GI/Hepatic negative GI ROS, Neg liver ROS,   Endo/Other  negative endocrine ROSdiabetes  Renal/GU CRFRenal disease  negative genitourinary   Musculoskeletal   Abdominal   Peds  Hematology  (+) Blood dyscrasia, anemia ,   Anesthesia Other Findings   Reproductive/Obstetrics negative OB ROS                             Anesthesia Physical Anesthesia Plan  ASA: III  Anesthesia Plan: General   Post-op Pain Management:    Induction:   PONV Risk Score and Plan: Propofol infusion  Airway Management Planned:   Additional Equipment:   Intra-op Plan:   Post-operative Plan:   Informed Consent: I have reviewed the patients History and Physical, chart, labs and discussed the procedure including the risks, benefits and alternatives for the proposed anesthesia with the patient or authorized representative who has indicated his/her understanding and acceptance.     Dental Advisory Given  Plan Discussed with: CRNA  Anesthesia Plan Comments:         Anesthesia Quick Evaluation

## 2020-03-10 NOTE — Discharge Instructions (Signed)
Open Lymph Node Biopsy An open lymph node biopsy is a procedure to remove a lymph node so that it can be examined under a microscope. Lymph nodes are part of the body's disease-fighting system (immune system). The immune system protects the body from infections, germs, and diseases. An open lymph node biopsy may be done to:  Look for germs or cancer cells in your lymph node.  Find out why your lymph node is swollen.  Find out more about a condition you have. Lymph nodes are found in many locations in the body. Biopsies are often done on lymph nodes in the head, neck, armpit, or groin. Tell a health care provider about:  Any allergies you have.  All medicines you are taking, including vitamins, herbs, eye drops, creams, and over-the-counter medicines.  Any problems you or family members have had with anesthetic medicines.  Any blood disorders you have.  Any surgeries you have had.  Any medical conditions you have or have had.  Whether you are pregnant or may be pregnant. What are the risks? Generally, this is a safe procedure. However, problems may occur, including:  Infection.  Bleeding.  Allergic reactions to medicines.  Damage to other structures or organs, such as a nerve.  Scarring. What happens before the procedure? Medicines Ask your health care provider about:  Changing or stopping your regular medicines. This is especially important if you are taking diabetes medicines or blood thinners.  Taking medicines such as aspirin and ibuprofen. These medicines can thin your blood. Do not take these medicines unless your health care provider tells you to take them.  Taking over-the-counter medicines, vitamins, herbs, and supplements. General instructions  Follow instructions from your health care provider about eating or drinking restrictions.  You may have an exam or testing.  You may have a blood or urine sample taken.  Plan to have someone take you home from the  hospital or clinic.  If you will be going home right after the procedure, plan to have someone with you for 24 hours.  Ask your health care provider how your surgical site will be marked or identified.  Ask your health care provider what steps will be taken to help prevent infection. These may include: ? Removing hair at the biopsy site. ? Washing skin with a germ-killing soap. ? Taking antibiotic medicine. What happens during the procedure?   An IV will be inserted into one of your veins.  You will be given one or more of the following: ? A medicine to help you relax (sedative). ? A medicine to numb the area (local anesthetic).  An incision will be made in the area where your lymph node is located.  Your lymph node will be removed.  Your incision will be closed with stitches (sutures).  An antibiotic ointment may be applied to your incision.  A bandage (dressing) will be placed over your incision. The procedure may vary among health care providers and hospitals. What happens after the procedure?  Your blood pressure, heart rate, breathing rate, and blood oxygen level will be monitored until you leave the hospital or clinic.  Do not drive for 24 hours if you were given a sedative during your procedure.  It is up to you to get the results of your procedure. Ask your health care provider, or the department that is doing the procedure, when your results will be ready. Summary  An open lymph node biopsy is a procedure to remove a lymph node so that   it can be checked for infections, germs, and disease.  Generally, this is a safe procedure. However, problems may occur, including bleeding, infection, allergic reaction to medicines, and damage to other structures or organs.  Follow your health care provider's instructions before the procedure. These may include changing or stopping some medicines and restricting what you eat and drink.  During the procedure, an incision will be  made in the area of the lymph node, the lymph node will be removed, and the incision will be closed with sutures.  You will be monitored after the procedure. Do not drive for 24 hours if you were given a sedative during your procedure. This information is not intended to replace advice given to you by your health care provider. Make sure you discuss any questions you have with your health care provider. Document Revised: 02/19/2018 Document Reviewed: 02/19/2018 Elsevier Patient Education  [place], Adult Taking care of your wound properly can help to prevent pain, infection, and scarring. It can also help your wound to heal more quickly. How to care for your wound Wound care      Follow instructions from your health care provider about how to take care of your wound. Make sure you: ? Wash your hands with soap and water before you change the bandage (dressing). If soap and water are not available, use hand sanitizer. ? Change your dressing as told by your health care provider. ? Leave stitches (sutures), skin glue, or adhesive strips in place. These skin closures may need to stay in place for 2 weeks or longer. If adhesive strip edges start to loosen and curl up, you may trim the loose edges. Do not remove adhesive strips completely unless your health care provider tells you to do that.  Check your wound area every day for signs of infection. Check for: ? Redness, swelling, or pain. ? Fluid or blood. ? Warmth. ? Pus or a bad smell.  Ask your health care provider if you should clean the wound with mild soap and water. Doing this may include: ? Using a clean towel to pat the wound dry after cleaning it. Do not rub or scrub the wound. ? Applying a cream or ointment. Do this only as told by your health care provider. ? Covering the incision with a clean dressing.  Ask your health care provider when you can leave the wound uncovered.  Keep the dressing dry until  your health care provider says it can be removed. Do not take baths, swim, use a hot tub, or do anything that would put the wound underwater until your health care provider approves. Ask your health care provider if you can take showers. You may only be allowed to take sponge baths. Medicines   If you were prescribed an antibiotic medicine, cream, or ointment, take or use the antibiotic as told by your health care provider. Do not stop taking or using the antibiotic even if your condition improves.  Take over-the-counter and prescription medicines only as told by your health care provider. If you were prescribed pain medicine, take it 30 or more minutes before you do any wound care or as told by your health care provider. General instructions  Return to your normal activities as told by your health care provider. Ask your health care provider what activities are safe.  Do not scratch or pick at the wound.  Do not use any products that contain nicotine or tobacco, such as cigarettes and  e-cigarettes. These may delay wound healing. If you need help quitting, ask your health care provider.  Keep all follow-up visits as told by your health care provider. This is important.  Eat a diet that includes protein, vitamin A, vitamin C, and other nutrient-rich foods to help the wound heal. ? Foods rich in protein include meat, dairy, beans, nuts, and other sources. ? Foods rich in vitamin A include carrots and dark green, leafy vegetables. ? Foods rich in vitamin C include citrus, tomatoes, and other fruits and vegetables. ? Nutrient-rich foods have protein, carbohydrates, fat, vitamins, or minerals. Eat a variety of healthy foods including vegetables, fruits, and whole grains. Contact a health care provider if:  You received a tetanus shot and you have swelling, severe pain, redness, or bleeding at the injection site.  Your pain is not controlled with medicine.  You have redness, swelling, or pain  around the wound.  You have fluid or blood coming from the wound.  Your wound feels warm to the touch.  You have pus or a bad smell coming from the wound.  You have a fever or chills.  You are nauseous or you vomit.  You are dizzy. Get help right away if:  You have a red streak going away from your wound.  The edges of the wound open up and separate.  Your wound is bleeding, and the bleeding does not stop with gentle pressure.  You have a rash.  You faint.  You have trouble breathing. Summary  Always wash your hands with soap and water before changing your bandage (dressing).  To help with healing, eat foods that are rich in protein, vitamin A, vitamin C, and other nutrients.  Check your wound every day for signs of infection. Contact your health care provider if you suspect that your wound is infected. This information is not intended to replace advice given to you by your health care provider. Make sure you discuss any questions you have with your health care provider. Document Revised: 11/02/2018 Document Reviewed: 01/30/2016 Elsevier Patient Education  [place].

## 2020-03-13 ENCOUNTER — Encounter (HOSPITAL_COMMUNITY): Payer: Self-pay | Admitting: General Surgery

## 2020-03-13 ENCOUNTER — Other Ambulatory Visit: Payer: Self-pay

## 2020-03-15 LAB — SURGICAL PATHOLOGY

## 2020-03-16 ENCOUNTER — Ambulatory Visit (HOSPITAL_COMMUNITY): Payer: Medicare HMO | Admitting: Hematology

## 2020-03-16 ENCOUNTER — Inpatient Hospital Stay (HOSPITAL_COMMUNITY): Payer: Medicare HMO | Attending: Hematology | Admitting: Hematology

## 2020-03-16 ENCOUNTER — Other Ambulatory Visit: Payer: Self-pay

## 2020-03-16 VITALS — BP 151/65 | HR 69 | Temp 97.1°F | Resp 17 | Wt 164.4 lb

## 2020-03-16 DIAGNOSIS — Z7982 Long term (current) use of aspirin: Secondary | ICD-10-CM | POA: Diagnosis not present

## 2020-03-16 DIAGNOSIS — Z79899 Other long term (current) drug therapy: Secondary | ICD-10-CM | POA: Insufficient documentation

## 2020-03-16 DIAGNOSIS — M8580 Other specified disorders of bone density and structure, unspecified site: Secondary | ICD-10-CM | POA: Diagnosis not present

## 2020-03-16 DIAGNOSIS — Z79811 Long term (current) use of aromatase inhibitors: Secondary | ICD-10-CM | POA: Diagnosis not present

## 2020-03-16 DIAGNOSIS — C50911 Malignant neoplasm of unspecified site of right female breast: Secondary | ICD-10-CM

## 2020-03-16 DIAGNOSIS — Z170 Estrogen receptor positive status [ER+]: Secondary | ICD-10-CM | POA: Diagnosis not present

## 2020-03-16 DIAGNOSIS — R590 Localized enlarged lymph nodes: Secondary | ICD-10-CM | POA: Diagnosis not present

## 2020-03-16 DIAGNOSIS — R599 Enlarged lymph nodes, unspecified: Secondary | ICD-10-CM | POA: Insufficient documentation

## 2020-03-16 DIAGNOSIS — Z7984 Long term (current) use of oral hypoglycemic drugs: Secondary | ICD-10-CM | POA: Insufficient documentation

## 2020-03-16 NOTE — Progress Notes (Signed)
[place] [address], [place] [address]   Patient Care Team: [name], MD as PCP - General (Internal Medicine)  SUMMARY OF ONCOLOGIC HISTORY: Oncology History  Invasive ductal carcinoma of breast, female, right ([place])  10/09/2016 Procedure   Left breast lumpectomy by Dr. Arnoldo Morale   10/16/2016 Pathology Results   Invasive, grade 1, ductal carcinoma, spanning 0.18 cmIn greatest dimension.  Low and intermediate grade DCIS with associated calcifications.  Intraductal papilloma formation with associated low-grade DCIS.  DCIS is less than 0.1 cm from the margin in multiple areas including intermediate ductal carcinoma in situ adjacent to cauterized edge.    10/16/2016 Pathology Results   ER +100%, PR +90%, HER-2 negative.   10/23/2016 Cancer Staging   Cancer Staging Invasive ductal carcinoma of breast, female, right [facility]) Staging form: Breast, AJCC 8th Edition - Pathologic stage from 10/23/2016: Stage Unknown (pT1a, pNX, cM0, G1, ER: Positive, PR: Positive, HER2: Negative) - Signed by Baird Cancer, PA-C on 10/23/2016    11/14/2016 Imaging   CT CAP-  1. No compelling findings of metastatic breast cancer. 2. Acute mild diverticulitis of the descending colon. 3. Further distally in the descending colon but there is a cluster of small lymph nodes adjacent to the colon. Although possibly related to the diverticulitis this is a bit distend and this type of clustered nodes is unusual in this location. Following resolution of the acute process, consider colonoscopy to exclude an underlying occult cancer. 4. 3.7 cm fatty mass along the lumen of the descending colon favors a lipoma or fatty polyp. 5. Aortic Atherosclerosis (ICD10-I70.0). 6. Minimal tree-in-bud reticulonodular opacities in the left lower lobe characteristic for atypical infectious bronchiolitis. 7. Left groin hernia containing adipose tissue, probably a direct inguinal hernia. 8. Exaggerated lumbar  lordosis with grade 1 degenerative anterolisthesis at L4-5.   11/26/2016 Imaging   Bone density- BMD as determined from [facility] Neck Left is 0.820 g/cm2 with a T-Score of -1.6.  This patient is considered OSTEOPENIC according to [facility]) criteria.     CHIEF COMPLIANT: lymphadenopathy and right breast cancer   INTERVAL HISTORY: Amy [name] is a 84 y.o. female here today for follow up of her lymphadenopathy and right breast cancer. Her last visit was on 02/16/2020.  Today she is accompanied by her son. She reports feeling fine today. She tolerated the biopsy well and the incision is healing nicely. She denies having any F/C or night seats. Her energy levels are stable and she is able to do her ADL's. Her appetite is good and she denies having any abdominal pain. She denies having any recent falls. She is tolerating the letrozole well.  She denies any family history of lymphomas.   REVIEW OF SYSTEMS:   Review of Systems  Constitutional: Positive for fatigue (mild). Negative for appetite change, chills, diaphoresis and fever.  Gastrointestinal: Negative for abdominal pain.  Psychiatric/Behavioral: Positive for depression.  All other systems reviewed and are negative.   I have reviewed the past medical history, past surgical history, social history and family history with the patient and they are unchanged from previous note.   ALLERGIES:   is allergic to cymbalta [duloxetine hcl].   MEDICATIONS:  Current Outpatient Medications  Medication Sig Dispense Refill  . ALPRAZolam (XANAX) 0.5 MG tablet Take 1 tablet (0.5 mg total) by mouth at bedtime. 30 tablet 2  . aspirin EC 81 MG tablet Take 81 mg by mouth every morning.    Marland Kitchen  atorvastatin (LIPITOR) 40 MG tablet Take 1 tablet (40 mg total) by mouth daily at 6 PM. 90 tablet 2  . calcium carbonate (OSCAL) 1500 (600 Ca) MG TABS tablet Take 600 mg of elemental calcium by mouth daily with supper.     .  cholecalciferol (VITAMIN D) 1000 units tablet Take 1 tablet (1,000 Units total) by mouth daily. 30 tablet 11  . cyanocobalamin (,VITAMIN B-12,) 1000 MCG/ML injection Daily for 4 days; then weekly for 1 month and then monthly after that. 12 mL 0  . donepezil (ARICEPT) 5 MG tablet Take 5 mg by mouth daily.    Marland Kitchen escitalopram (LEXAPRO) 20 MG tablet Take 1 tablet (20 mg total) by mouth daily. 30 tablet 2  . gabapentin (NEURONTIN) 300 MG capsule Take 1 capsule (300 mg total) by mouth at bedtime. 30 capsule 2  . letrozole (FEMARA) 2.5 MG tablet TAKE 1 TABLET(2.5 MG) BY MOUTH DAILY 90 tablet 3  . losartan (COZAAR) 25 MG tablet Take 12.5 mg by mouth daily.    . metFORMIN (GLUCOPHAGE) 500 MG tablet Take 1 tablet (500 mg total) by mouth 2 (two) times daily with a meal. 60 tablet 0  . metoprolol succinate (TOPROL XL) 25 MG 24 hr tablet Take 1 tablet (25 mg total) by mouth daily. 30 tablet 11  . ONETOUCH ULTRA test strip daily.    Marland Kitchen oxybutynin (DITROPAN) 5 MG tablet Take 5 mg by mouth at bedtime.    . risperiDONE (RISPERDAL) 1 MG tablet Take 1 tablet (1 mg total) by mouth at bedtime. 30 tablet 2  . traZODone (DESYREL) 50 MG tablet Take 1 tablet (50 mg total) by mouth at bedtime. 30 tablet 2  . venlafaxine XR (EFFEXOR-XR) 150 MG 24 hr capsule TAKE 1 CAPSULE(150 MG) BY MOUTH TWICE DAILY 60 capsule 2  . losartan (COZAAR) 25 MG tablet Take 0.5 tablets (12.5 mg total) by mouth at bedtime. 45 tablet 3   No current facility-administered medications for this visit.     PHYSICAL EXAMINATION: Performance status (ECOG): 1 - Symptomatic but completely ambulatory  Vitals:   03/16/20 1552  BP: (!) 151/65  Pulse: 69  Resp: 17  Temp: (!) 97.1 F (36.2 C)  SpO2: 97%   Wt Readings from Last 3 Encounters:  03/16/20 164 lb 6.4 oz (74.6 kg)  03/08/20 159 lb (72.1 kg)  03/07/20 159 lb (72.1 kg)   Physical Exam Vitals reviewed.  Constitutional:      Appearance: Normal appearance.  Cardiovascular:     Rate and  Rhythm: Normal rate and regular rhythm.     Pulses: Normal pulses.     Heart sounds: Normal heart sounds.  Pulmonary:     Effort: Pulmonary effort is normal.     Breath sounds: Normal breath sounds.  Abdominal:     Palpations: Abdomen is soft. There is no mass.     Tenderness: There is no abdominal tenderness.  Lymphadenopathy:     Cervical: No cervical adenopathy.     Upper Body:     Right upper body: No supraclavicular or axillary adenopathy.     Left upper body: No supraclavicular or axillary adenopathy.  Skin:      Neurological:     General: No focal deficit present.     Mental Status: She is alert and oriented to person, place, and time.  Psychiatric:        Mood and Affect: Mood normal.        Behavior: Behavior normal.  Breast Exam Chaperone: Milinda Antis, MD     LABORATORY DATA:  I have reviewed the data as listed CMP Latest Ref Rng & Units 03/08/2020 12/30/2019 12/11/2019  Glucose 70 - 99 mg/dL 74 173(H) 78  BUN 8 - 23 mg/dL _0 Creatinine 0.44 - 1.00 mg/dL 0.80 0.84 0.76  Sodium 135 - 145 mmol/L 138 137 140  Potassium 3.5 - 5.1 mmol/L 5.0 4.0 3.9  Chloride 98 - 111 mmol/L 102 97(L) 104  CO2 22 - 32 mmol/L _1 Calcium 8.9 - 10.3 mg/dL 9.2 9.3 8.8(L)  Total Protein 6.5 - 8.1 g/dL - 6.5 -  Total Bilirubin 0.3 - 1.2 mg/dL - 0.5 -  Alkaline Phos 38 - 126 U/L - 76 -  AST 15 - 41 U/L - 23 -  ALT 0 - 44 U/L - 15 -   Lab Results  Component Value Date   CAN153 18.8 02/14/2020   CAN153 15.7 12/01/2018   Lab Results  Component Value Date   WBC 7.0 12/30/2019   HGB 12.9 12/30/2019   HCT 39.7 12/30/2019   MCV 93.4 12/30/2019   PLT 234 12/30/2019   NEUTROABS 5.4 12/30/2019   Lab Results  Component Value Date   VD25OH 72.72 12/30/2019   VD25OH 42.68 06/22/2019   Lab Results  Component Value Date   CA2729 19.2 02/14/2020   Lab Results  Component Value Date   CEA1 2.6 02/14/2020   Surgical pathology [phone]) on 03/10/2020: Left  inguinal lymph node biopsy: follicular lymphoma, large cell type, grade 3A.  ASSESSMENT:  1.Stage Ia right breast cancer: -Status post lumpectomy with Dr. Arnoldo Morale on 10/09/2016. Pathology showed IDC of right breast, ER/PR positive, HER-2 negative. -Did not receive radiation. Started on anastrozole in March 2018. She did not take anastrozole from 05/17/2017 through 10/15/2017 likely from intolerance. -She was subsequently started on letrozole 2.5 mg daily. -Mammogram on 12/07/2019 was BI-RADS Category 2.  2.Osteopenia: -DEXA scan on 11/26/2017 shows T score of -1.7. -Prolia every 6 months started on 12/19/2016.  3.  Hypermetabolic retroperitoneal, pelvic and left inguinal adenopathy: -PET scan on 02/14/2020 shows hypermetabolic lower retroperitoneal, common iliac and external iliac, left obturator, left pelvic sidewall, left sciatic notch and left inguinal adenopathy.  Left upper inguinal lymph node measures 2.4 cm in short axis, SUV 11.9.  Left lower inguinal lymph node SUV 13.4.  Left pelvic sidewall lymph node SUV 12.1. -Left inguinal biopsy on 1/66/0630 shows follicular lymphoma, grade 3A.  Lymphoid follicles are composed by mixture of large lymphoid cells including large cleaved lymphocytes with vesicular chromatin and small nucleoli admixed with small round to angulated lymphoid cells.  Large lymphoid cells within the follicles are present more than 15 cells per hpf.  Ki-67 is 25%.   PLAN:  1.Stage Ia right breast cancer: -Continue letrozole.  Continue yearly mammograms.  2.Osteopenia: -Continue Prolia 6 months.  Last Prolia on 01/04/2020.  Continue calcium and vitamin D.  3.  Stage III A follicular lymphoma, large cell type (grade 3A): -Left inguinal lymph node biopsy on 1/60/1093 shows follicular lymphoma, large cell type. -She does not have any B symptoms or severe fatigue ordered pains in the lymph nodes. -We have discussed extensively about her new diagnosis and management  options. -Given her age and functional status, I think the best option would be close observation. -We will see her back in 3 months for follow-up with repeat labs. -She was told to come back sooner should she develop any B  symptoms or painful lymphadenopathy.   Breast Cancer therapy associated bone loss: I have recommended calcium, Vitamin D and weight bearing exercises.  No orders of the defined types were placed in this encounter.  The patient has a good understanding of the overall plan. she agrees with it. she will call with any problems that may develop before the next visit here.    Derek Jack, MD [place] [phone]   I, Milinda Antis, am acting as a scribe for Dr. Sanda Linger.  I, Derek Jack MD, have reviewed the above documentation for accuracy and completeness, and I agree with the above.

## 2020-03-16 NOTE — Patient Instructions (Signed)
[place] at [facility] Discharge Instructions  You were seen today by Dr. Delton Coombes. He went over your recent results and scans; your recent biopsy showed a grade III follicular lymphoma, which will need to be treated. Be vigilant for any of the following symptoms, including fevers, chills, night sweats, unexpected weight loss, or any new pains, and call the office immediately. Dr. Delton Coombes will see you back in 3 months for labs and follow up.   Thank you for choosing [place] at [facility] to provide your oncology and hematology care.  To afford each patient quality time with our provider, please arrive at least 15 minutes before your scheduled appointment time.   If you have a lab appointment with the [facility] please come in thru the Main Entrance and check in at the main information desk  You need to re-schedule your appointment should you arrive 10 or more minutes late.  We strive to give you quality time with our providers, and arriving late affects you and other patients whose appointments are after yours.  Also, if you no show three or more times for appointments you may be dismissed from the clinic at the providers discretion.     Again, thank you for choosing [facility].  Our hope is that these requests will decrease the amount of time that you wait before being seen by our physicians.       _____________________________________________________________  Should you have questions after your visit to [facility], please contact our office at [phone] between the hours of 8:00 a.m. and 4:30 p.m.  Voicemails left after 4:00 p.m. will not be returned until the following business day.  For prescription refill requests, have your pharmacy contact our office and allow 72 hours.    Cancer Center Support Programs:   > Cancer Support Group  2nd Tuesday of the month 1pm-2pm, Journey Room

## 2020-03-17 ENCOUNTER — Encounter (HOSPITAL_COMMUNITY): Payer: Self-pay | Admitting: *Deleted

## 2020-03-17 ENCOUNTER — Telehealth: Payer: Medicare HMO | Admitting: General Surgery

## 2020-03-17 NOTE — Progress Notes (Signed)
[employer] cancer claim forms filled out.  Left at office front desk for her POA to come and pick up to complete his part of form and sign.  We will fax for them if they desire at that time.

## 2020-03-23 ENCOUNTER — Ambulatory Visit
Admission: EM | Admit: 2020-03-23 | Discharge: 2020-03-23 | Disposition: A | Discharge status: Home / Self Care | Payer: Medicare HMO | Attending: Emergency Medicine | Admitting: Emergency Medicine

## 2020-03-23 ENCOUNTER — Encounter: Payer: Self-pay | Admitting: Emergency Medicine

## 2020-03-23 ENCOUNTER — Ambulatory Visit (INDEPENDENT_AMBULATORY_CARE_PROVIDER_SITE_OTHER): Payer: Medicare HMO

## 2020-03-23 DIAGNOSIS — M25572 Pain in left ankle and joints of left foot: Secondary | ICD-10-CM

## 2020-03-23 DIAGNOSIS — M79675 Pain in left toe(s): Secondary | ICD-10-CM

## 2020-03-23 DIAGNOSIS — M25475 Effusion, left foot: Secondary | ICD-10-CM

## 2020-03-23 DIAGNOSIS — M25472 Effusion, left ankle: Secondary | ICD-10-CM

## 2020-03-23 DIAGNOSIS — M7989 Other specified soft tissue disorders: Secondary | ICD-10-CM

## 2020-03-23 MED ORDER — PREDNISONE 20 MG PO TABS: 20.0000 mg | ORAL_TABLET | Freq: Every day | ORAL | 0 refills | Status: AC

## 2020-03-23 NOTE — ED Triage Notes (Signed)
See provider note. 

## 2020-03-23 NOTE — Discharge Instructions (Signed)
Unable to rule out blood clot in urgent care setting.  Offered patient further evaluation and management in the ED.  Patient declines at this time and would like to try outpatient therapy first.  Aware of the risk associated with this decision including missed diagnosis, organ damage, organ failure, and/or death.  Patient aware and in agreement.     X-rays negative for fracture or dislocation.  Positive for arthritis Continue conservative management of rest, ice, and elevation  Prednisone prescribed.  Take as directed and to completion Follow up with PCP if symptoms persist Return or go to the ER if you have any new or worsening symptoms (fever, chills, chest pain, shortness of breath, increased pain, redness, swelling, etc...)

## 2020-03-23 NOTE — ED Provider Notes (Signed)
[place]   [id number] 03/23/20 Arrival Time: 1125  CC: LT foot PAIN  SUBJECTIVE: History from: patient. Patient is a poor historian.  CASADY VOSHELL is a 84 y.o. female hx significant for stroke, breast cancer, DM, high cholesterol, osteopenia, and dementia, complains of LT ankle, foot pain and swelling that began 3 days ago.  Recent procedure for lymph node biopsy to LT groin on 03/10/20.  Denies a precipitating event or specific injury.  Localizes the pain to the LLE.  Denies alleviating or aggravating factors.  Denies similar symptoms in the past.  Denies fever, chills, CP, SOB, ecchymosis, weakness, numbness and tingling, saddle paresthesias, loss of bowel or bladder function.      ROS: As per HPI.  All other pertinent ROS negative.     Past Medical History:  Diagnosis Date  . Anxiety   . Arthritis   . Back pain   . Colon polyps   . Dementia ([place])    early signs per daughter in law  . Depression   . Diabetes mellitus, type II ([place])    diet controlled  . Hypercholesterolemia   . Invasive ductal carcinoma of breast, female, right ([place])   . Osteopenia 11/26/2016  . Skin-picking disorder    has 3 open wounds on right wrist that are being treated. About a quarter in size.  . Stroke [facility])    Past Surgical History:  Procedure Laterality Date  . APPENDECTOMY    . BACK SURGERY    . BREAST BIOPSY Right 10/09/2016   Procedure: BREAST BIOPSY WITH NEEDLE LOCALIZATION;  Surgeon: [name], MD;  Location: [facility] ORS;  Service: General;  Laterality: Right;  . BREAST SURGERY Right   . BUBBLE STUDY  03/23/2019   Procedure: BUBBLE STUDY;  Surgeon: [name], MD;  Location: [facility] ENDOSCOPY;  Service: Cardiovascular;;  . BUNIONECTOMY Bilateral   . FACIAL COSMETIC SURGERY     drooping eyelid and brow  . HERNIA REPAIR Right    Inguinal x2  . implantable loop recorder placement  10/21/2019    Medtronic Reveal [place] model [id number] [id number]) implanted by Dr [name] for  cryptogenic stroke indication  . INGUINAL LYMPH NODE BIOPSY Left 03/10/2020   Procedure: LEFT INGUINAL LYMPH NODE BIOPSY;  Surgeon: [name], MD;  Location: [facility] ORS;  Service: General;  Laterality: Left;  . KNEE ARTHROSCOPY Right   . SHOULDER SURGERY Right    rotator cuff repair and removal of bone spur  . TEE WITHOUT CARDIOVERSION N/A 03/23/2019   Procedure: TRANSESOPHAGEAL ECHOCARDIOGRAM (TEE);  Surgeon: [name], MD;  Location: [facility] ENDOSCOPY;  Service: Cardiovascular;  Laterality: N/A;  . TEMPOROMANDIBULAR JOINT SURGERY Right   . TOTAL KNEE ARTHROPLASTY Right 05/05/2018   Procedure: TOTAL KNEE ARTHROPLASTY;  Surgeon: [name], MD;  Location: [facility] ORS;  Service: Orthopedics;  Laterality: Right;   Allergies  Allergen Reactions  . Cymbalta [Duloxetine Hcl] Other (See Comments)    Hallucinated and couldn't think; got worse and worse the longer she took this.   No current facility-administered medications on file prior to encounter.   Current Outpatient Medications on File Prior to Encounter  Medication Sig Dispense Refill  . ALPRAZolam (XANAX) 0.5 MG tablet Take 1 tablet (0.5 mg total) by mouth at bedtime. 30 tablet 2  . aspirin EC 81 MG tablet Take 81 mg by mouth every morning.    Marland Kitchen atorvastatin (LIPITOR) 40 MG tablet Take 1 tablet (40 mg total) by mouth daily at 6 PM.  90 tablet 2  . calcium carbonate (OSCAL) 1500 (600 Ca) MG TABS tablet Take 600 mg of elemental calcium by mouth daily with supper.     . cholecalciferol (VITAMIN D) 1000 units tablet Take 1 tablet (1,000 Units total) by mouth daily. 30 tablet 11  . cyanocobalamin (,VITAMIN B-12,) 1000 MCG/ML injection Daily for 4 days; then weekly for 1 month and then monthly after that. 12 mL 0  . donepezil (ARICEPT) 5 MG tablet Take 5 mg by mouth daily.    Marland Kitchen escitalopram (LEXAPRO) 20 MG tablet Take 1 tablet (20 mg total) by mouth daily. 30 tablet 2  . gabapentin (NEURONTIN) 300 MG capsule Take 1 capsule (300 mg total)  by mouth at bedtime. 30 capsule 2  . letrozole (FEMARA) 2.5 MG tablet TAKE 1 TABLET(2.5 MG) BY MOUTH DAILY 90 tablet 3  . losartan (COZAAR) 25 MG tablet Take 0.5 tablets (12.5 mg total) by mouth at bedtime. 45 tablet 3  . losartan (COZAAR) 25 MG tablet Take 12.5 mg by mouth daily.    . metFORMIN (GLUCOPHAGE) 500 MG tablet Take 1 tablet (500 mg total) by mouth 2 (two) times daily with a meal. 60 tablet 0  . metoprolol succinate (TOPROL XL) 25 MG 24 hr tablet Take 1 tablet (25 mg total) by mouth daily. 30 tablet 11  . ONETOUCH ULTRA test strip daily.    Marland Kitchen oxybutynin (DITROPAN) 5 MG tablet Take 5 mg by mouth at bedtime.    . risperiDONE (RISPERDAL) 1 MG tablet Take 1 tablet (1 mg total) by mouth at bedtime. 30 tablet 2  . traZODone (DESYREL) 50 MG tablet Take 1 tablet (50 mg total) by mouth at bedtime. 30 tablet 2  . venlafaxine XR (EFFEXOR-XR) 150 MG 24 hr capsule TAKE 1 CAPSULE(150 MG) BY MOUTH TWICE DAILY 60 capsule 2   Social History   Socioeconomic History  . Marital status: Divorced    Spouse name: Not on file  . Number of children: Not on file  . Years of education: Not on file  . Highest education level: Not on file  Occupational History  . Occupation: Nurse    Comment: Retired  Tobacco Use  . Smoking status: Former Smoker    Packs/day: 0.50    Years: 40.00    Pack years: 20.00    Types: Cigarettes    Quit date: 09/04/1995    Years since quitting: 24.5  . Smokeless tobacco: Never Used  Vaping Use  . Vaping Use: Never used  Substance and Sexual Activity  . Alcohol use: No  . Drug use: No    Types: Benzodiazepines, Hydrocodone  . Sexual activity: Never  Other Topics Concern  . Not on file  Social History Narrative   Divorced.  Former smoker, quit in 1997.  Nondrinker.    Social Determinants of Health   Financial Resource Strain:   . Difficulty of Paying Living Expenses: Not on file  Food Insecurity:   . Worried About Charity fundraiser in the Last Year: Not on file    . Ran Out of Food in the Last Year: Not on file  Transportation Needs:   . Lack of Transportation (Medical): Not on file  . Lack of Transportation (Non-Medical): Not on file  Physical Activity:   . Days of Exercise per Week: Not on file  . Minutes of Exercise per Session: Not on file  Stress:   . Feeling of Stress : Not on file  Social Connections:   .  Frequency of Communication with Friends and Family: Not on file  . Frequency of Social Gatherings with Friends and Family: Not on file  . Attends Religious Services: Not on file  . Active Member of Clubs or Organizations: Not on file  . Attends Archivist Meetings: Not on file  . Marital Status: Not on file  Intimate Partner Violence:   . Fear of Current or Ex-Partner: Not on file  . Emotionally Abused: Not on file  . Physically Abused: Not on file  . Sexually Abused: Not on file   Family History  Problem Relation Age of Onset  . Anxiety disorder Mother   . Depression Mother   . Depression Maternal Grandmother   . Alcohol abuse Brother   . Cancer - Other Sister   . ADD / ADHD Neg Hx   . Bipolar disorder Neg Hx   . Dementia Neg Hx   . Drug abuse Neg Hx   . OCD Neg Hx   . Paranoid behavior Neg Hx   . Schizophrenia Neg Hx   . Seizures Neg Hx   . Sexual abuse Neg Hx   . Physical abuse Neg Hx     OBJECTIVE:  Vitals:   03/23/20 1204  BP: (!) 151/66  Resp: 20  Temp: 97.9 F (36.6 C)  SpO2: 94%    General appearance: ALERT; in no acute distress.  Head: NCAT Lungs: Normal respiratory effort CV: Dorsalis pedis pulse 2+; cap refill < 2 seconds Musculoskeletal: LT foot Inspection: LT foot with swelling and overlying erythema Palpation: Diffusely TTP over proximal 4-5th MTs, medial and lateral malleoli ROM: FROM active and passive Strength:  5/5 dorsiflexion, 5/5 plantar flexion Skin: warm and dry Neurologic: Ambulates with a walker; Sensation intact about the lower extremities Psychological: alert and  cooperative; normal mood and affect  DIAGNOSTIC STUDIES:  DG Ankle Complete Left  Result Date: 03/23/2020 CLINICAL DATA:  Foot swelling.  Redness. EXAM: LEFT ANKLE COMPLETE - 3+ VIEW COMPARISON:  04/12/2011 FINDINGS: There is diffuse soft tissue swelling identified. No acute fracture or subluxation identified. Small plantar and posterior calcaneal heel spurs. IMPRESSION: 1. Diffuse soft tissue swelling.  No acute bone abnormality. 2. Heel spurs. Electronically Signed   By: Kerby Moors M.D.   On: 03/23/2020 12:12   DG Foot Complete Left  Result Date: 03/23/2020 CLINICAL DATA:  Left ankle and foot pain, swelling and redness. No known injury. EXAM: LEFT FOOT - COMPLETE 3+ VIEW COMPARISON:  None. FINDINGS: No acute bony or joint abnormality is identified. The patient is status post hallux valgus repair. There is moderate to moderately severe first MTP osteoarthritis. No soft tissue gas or foreign body. Pins in the first metatarsal for osteotomy fixation noted. Calcaneal spurring is seen. IMPRESSION: Soft tissue swelling without underlying acute bony or joint abnormality. First MTP osteoarthritis. Electronically Signed   By: Inge Rise M.D.   On: 03/23/2020 12:13    X-rays negative for bony abnormalities including fracture, or dislocation.    I have reviewed the x-rays myself and the radiologist interpretation. I am in agreement with the radiologist interpretation.     ASSESSMENT & PLAN:  1. Pain and swelling of toe of left foot   2. Pain and swelling of left ankle     Meds ordered this encounter  Medications  . predniSONE (DELTASONE) 20 MG tablet    Sig: Take 1 tablet (20 mg total) by mouth daily with breakfast for 5 days.    Dispense:  5 tablet  Refill:  0    Order Specific Question:   Supervising Provider    Answer:   Raylene Everts [[id number]]   Unable to rule out blood clot in urgent care setting.  Offered patient further evaluation and management in the ED.  Patient  declines at this time and would like to try outpatient therapy first.  Aware of the risk associated with this decision including missed diagnosis, organ damage, organ failure, and/or death.  Patient aware and in agreement.     X-rays negative for fracture or dislocation.  Positive for arthritis Continue conservative management of rest, ice, and elevation  Prednisone prescribed.  Take as directed and to completion Follow up with PCP if symptoms persist Return or go to the ER if you have any new or worsening symptoms (fever, chills, chest pain, shortness of breath, increased pain, redness, swelling, etc...)    Reviewed expectations re: course of current medical issues. Questions answered. Outlined signs and symptoms indicating need for more acute intervention. Patient verbalized understanding. After Visit Summary given.    Lestine Box, PA-C 03/23/20 1242

## 2020-04-06 ENCOUNTER — Other Ambulatory Visit (HOSPITAL_COMMUNITY): Payer: Self-pay | Admitting: Psychiatry

## 2020-04-06 NOTE — Telephone Encounter (Signed)
Call for appt, she will need 30 min

## 2020-04-10 ENCOUNTER — Ambulatory Visit (INDEPENDENT_AMBULATORY_CARE_PROVIDER_SITE_OTHER): Payer: Medicare HMO | Admitting: *Deleted

## 2020-04-10 DIAGNOSIS — I639 Cerebral infarction, unspecified: Secondary | ICD-10-CM | POA: Diagnosis not present

## 2020-04-10 LAB — CUP PACEART REMOTE DEVICE CHECK
Date Time Interrogation Session: 20210910025033
Implantable Pulse Generator Implant Date: 20210325

## 2020-04-11 NOTE — Progress Notes (Signed)
[facility] Summary Report / Loop Recorder 

## 2020-04-13 ENCOUNTER — Other Ambulatory Visit: Payer: Self-pay

## 2020-04-13 ENCOUNTER — Encounter (HOSPITAL_COMMUNITY): Payer: Self-pay | Admitting: Psychiatry

## 2020-04-13 ENCOUNTER — Telehealth (INDEPENDENT_AMBULATORY_CARE_PROVIDER_SITE_OTHER): Payer: Medicare HMO | Admitting: Psychiatry

## 2020-04-13 DIAGNOSIS — F330 Major depressive disorder, recurrent, mild: Secondary | ICD-10-CM | POA: Diagnosis not present

## 2020-04-13 MED ORDER — TRAZODONE HCL 50 MG PO TABS: 50.0000 mg | ORAL_TABLET | Freq: Every day | ORAL | 2 refills | Status: DC

## 2020-04-13 MED ORDER — RISPERIDONE 1 MG PO TABS: ORAL_TABLET | ORAL | 2 refills | Status: DC

## 2020-04-13 MED ORDER — ESCITALOPRAM OXALATE 20 MG PO TABS: 20.0000 mg | ORAL_TABLET | Freq: Every day | ORAL | 2 refills | Status: DC

## 2020-04-13 MED ORDER — VENLAFAXINE HCL ER 150 MG PO CP24: ORAL_CAPSULE | ORAL | 2 refills | Status: DC

## 2020-04-13 MED ORDER — ALPRAZOLAM 0.5 MG PO TABS: 0.5000 mg | ORAL_TABLET | Freq: Every day | ORAL | 2 refills | Status: DC

## 2020-04-13 NOTE — Progress Notes (Signed)
Virtual Visit via Telephone Note  I connected with [name] on 04/13/20 at  2:40 PM EDT by telephone and verified that I am speaking with the correct person using two identifiers.   I discussed the limitations, risks, security and privacy concerns of performing an evaluation and management service by telephone and the availability of in person appointments. I also discussed with the patient that there may be a patient responsible charge related to this service. The patient expressed understanding and agreed to proceed.     I discussed the assessment and treatment plan with the patient. The patient was provided an opportunity to ask questions and all were answered. The patient agreed with the plan and demonstrated an understanding of the instructions.   The patient was advised to call back or seek an in-person evaluation if the symptoms worsen or if the condition fails to improve as anticipated.  I provided 15 minutes of non-face-to-face time during this encounter. Location: Provider Home, patient home  Levonne Spiller, MD  [facility] MD/PA/NP OP Progress Note  04/13/2020 2:57 PM RUBENA ROSEMAN  MRN:  [id number]  Chief Complaint:  Chief Complaint    Depression; Anxiety; Memory Loss; Follow-up     HPI: This patient is an 84 year old divorced white female who lives alone in [place].  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  The patient returns for follow-up after 3 months.  I spoke with her and also with her son.  The patient has a history of depression anxiety and mild dementia.  The patient seemed to be in pretty good spirits today.  She has a home health aide who comes in every day but Tuesday and Sunday.  Her son states that she really does not do well when she is all alone and she gets depressed.  However when around people she does quite well.  She states that she is sleeping well and denies visual or  auditory hallucinations or paranoia.  She denies being seriously depressed.  She denies significant anxiety.  She denies having severe pain right now although she never did have the back surgery she had hoped for.  She does have problems with short-term memory.  However she is aware of her setting her situation who I am .  She is adamant about not wanting to go into a nursing care facility.  Her son thinks it for this time being she is doing okay where she is Visit Diagnosis:    ICD-10-CM   1. Major depressive disorder, recurrent episode, mild with anxious distress (HCC)  F33.0 ALPRAZolam (XANAX) 0.5 MG tablet    Past Psychiatric History: Psychiatric hospitalization in her 29s since then outpatient treatment  Past Medical History:  Past Medical History:  Diagnosis Date  . Anxiety   . Arthritis   . Back pain   . Colon polyps   . Dementia ([place])    early signs per daughter in law  . Depression   . Diabetes mellitus, type II ([place])    diet controlled  . Hypercholesterolemia   . Invasive ductal carcinoma of breast, female, right ([place])   . Osteopenia 11/26/2016  . Skin-picking disorder    has 3 open wounds on right wrist that are being treated. About a quarter in size.  . Stroke [facility])     Past Surgical History:  Procedure Laterality Date  . APPENDECTOMY    . BACK SURGERY    .  BREAST BIOPSY Right 10/09/2016   Procedure: BREAST BIOPSY WITH NEEDLE LOCALIZATION;  Surgeon: [name], MD;  Location: [facility] ORS;  Service: General;  Laterality: Right;  . BREAST SURGERY Right   . BUBBLE STUDY  03/23/2019   Procedure: BUBBLE STUDY;  Surgeon: [name], MD;  Location: [facility] ENDOSCOPY;  Service: Cardiovascular;;  . BUNIONECTOMY Bilateral   . FACIAL COSMETIC SURGERY     drooping eyelid and brow  . HERNIA REPAIR Right    Inguinal x2  . implantable loop recorder placement  10/21/2019    Medtronic Reveal [place] model [id number] [id number]) implanted by Dr [name] for cryptogenic stroke indication  .  INGUINAL LYMPH NODE BIOPSY Left 03/10/2020   Procedure: LEFT INGUINAL LYMPH NODE BIOPSY;  Surgeon: [name], MD;  Location: [facility]S;  Service: General;  Laterality: Left;  . KNEE ARTHROSCOPY Right   . SHOULDER SURGERY Right    rotator cuff repair and removal of bone spur  . TEE WITHOUT CARDIOVERSION N/A 03/23/2019   Procedure: TRANSESOPHAGEAL ECHOCARDIOGRAM (TEE);  Surgeon: [name], MD;  Location: [facility] ENDOSCOPY;  Service: Cardiovascular;  Laterality: N/A;  . TEMPOROMANDIBULAR JOINT SURGERY Right   . TOTAL KNEE ARTHROPLASTY Right 05/05/2018   Procedure: TOTAL KNEE ARTHROPLASTY;  Surgeon: [name], MD;  Location: [facility] ORS;  Service: Orthopedics;  Laterality: Right;    Family Psychiatric History: see below  Family History:  Family History  Problem Relation Age of Onset  . Anxiety disorder Mother   . Depression Mother   . Depression Maternal Grandmother   . Alcohol abuse Brother   . Cancer - Other Sister   . ADD / ADHD Neg Hx   . Bipolar disorder Neg Hx   . Dementia Neg Hx   . Drug abuse Neg Hx   . OCD Neg Hx   . Paranoid behavior Neg Hx   . Schizophrenia Neg Hx   . Seizures Neg Hx   . Sexual abuse Neg Hx   . Physical abuse Neg Hx     Social History:  Social History   Socioeconomic History  . Marital status: Divorced    Spouse name: Not on file  . Number of children: Not on file  . Years of education: Not on file  . Highest education level: Not on file  Occupational History  . Occupation: Nurse    Comment: Retired  Tobacco Use  . Smoking status: Former Smoker    Packs/day: 0.50    Years: 40.00    Pack years: 20.00    Types: Cigarettes    Quit date: 09/04/1995    Years since quitting: 24.6  . Smokeless tobacco: Never Used  Vaping Use  . Vaping Use: Never used  Substance and Sexual Activity  . Alcohol use: No  . Drug use: No    Types: Benzodiazepines, Hydrocodone  . Sexual activity: Never  Other Topics Concern  . Not on file  Social History  Narrative   Divorced.  Former smoker, quit in 1997.  Nondrinker.    Social Determinants of Health   Financial Resource Strain:   . Difficulty of Paying Living Expenses: Not on file  Food Insecurity:   . Worried About Charity fundraiser in the Last Year: Not on file  . Ran Out of Food in the Last Year: Not on file  Transportation Needs:   . Lack of Transportation (Medical): Not on file  . Lack of Transportation (Non-Medical): Not on file  Physical Activity:   .  Days of Exercise per Week: Not on file  . Minutes of Exercise per Session: Not on file  Stress:   . Feeling of Stress : Not on file  Social Connections:   . Frequency of Communication with Friends and Family: Not on file  . Frequency of Social Gatherings with Friends and Family: Not on file  . Attends Religious Services: Not on file  . Active Member of Clubs or Organizations: Not on file  . Attends Archivist Meetings: Not on file  . Marital Status: Not on file    Allergies:  Allergies  Allergen Reactions  . Cymbalta [Duloxetine Hcl] Other (See Comments)    Hallucinated and couldn't think; got worse and worse the longer she took this.    Metabolic Disorder Labs: Lab Results  Component Value Date   HGBA1C 6.1 (H) 03/08/2020   MPG 128.37 03/08/2020   MPG 119.76 10/05/2019   No results found for: PROLACTIN Lab Results  Component Value Date   CHOL 172 05/06/2019   TRIG 203 (H) 05/06/2019   HDL 83 05/06/2019   CHOLHDL 2.1 05/06/2019   VLDL 35 03/21/2019   LDLCALC 57 05/06/2019   LDLCALC 153 (H) 03/21/2019   Lab Results  Component Value Date   TSH 1.238 10/07/2019   TSH 1.125 10/05/2019    Therapeutic Level Labs: No results found for: LITHIUM No results found for: VALPROATE No components found for:  CBMZ  Current Medications: Current Outpatient Medications  Medication Sig Dispense Refill  . ALPRAZolam (XANAX) 0.5 MG tablet Take 1 tablet (0.5 mg total) by mouth at bedtime. 30 tablet 2  .  aspirin EC 81 MG tablet Take 81 mg by mouth every morning.    Marland Kitchen atorvastatin (LIPITOR) 40 MG tablet Take 1 tablet (40 mg total) by mouth daily at 6 PM. 90 tablet 2  . calcium carbonate (OSCAL) 1500 (600 Ca) MG TABS tablet Take 600 mg of elemental calcium by mouth daily with supper.     . cholecalciferol (VITAMIN D) 1000 units tablet Take 1 tablet (1,000 Units total) by mouth daily. 30 tablet 11  . cyanocobalamin (,VITAMIN B-12,) 1000 MCG/ML injection Daily for 4 days; then weekly for 1 month and then monthly after that. 12 mL 0  . donepezil (ARICEPT) 5 MG tablet Take 5 mg by mouth daily.    Marland Kitchen escitalopram (LEXAPRO) 20 MG tablet Take 1 tablet (20 mg total) by mouth daily. 30 tablet 2  . gabapentin (NEURONTIN) 300 MG capsule TAKE 1 CAPSULE(300 MG) BY MOUTH AT BEDTIME 30 capsule 2  . letrozole (FEMARA) 2.5 MG tablet TAKE 1 TABLET(2.5 MG) BY MOUTH DAILY 90 tablet 3  . losartan (COZAAR) 25 MG tablet Take 0.5 tablets (12.5 mg total) by mouth at bedtime. 45 tablet 3  . losartan (COZAAR) 25 MG tablet Take 12.5 mg by mouth daily.    . metFORMIN (GLUCOPHAGE) 500 MG tablet Take 1 tablet (500 mg total) by mouth 2 (two) times daily with a meal. 60 tablet 0  . metoprolol succinate (TOPROL XL) 25 MG 24 hr tablet Take 1 tablet (25 mg total) by mouth daily. 30 tablet 11  . ONETOUCH ULTRA test strip daily.    Marland Kitchen oxybutynin (DITROPAN) 5 MG tablet Take 5 mg by mouth at bedtime.    . risperiDONE (RISPERDAL) 1 MG tablet TAKE 1 TABLET(1 MG) BY MOUTH AT BEDTIME 30 tablet 2  . traZODone (DESYREL) 50 MG tablet Take 1 tablet (50 mg total) by mouth at bedtime. 30 tablet  2  . venlafaxine XR (EFFEXOR-XR) 150 MG 24 hr capsule TAKE 1 CAPSULE(150 MG) BY MOUTH TWICE DAILY 60 capsule 2   No current facility-administered medications for this visit.     Musculoskeletal: Strength & Muscle Tone: decreased Gait & Station: unsteady Patient leans: N/A  Psychiatric Specialty Exam: Review of Systems  Musculoskeletal: Positive for  arthralgias, back pain and gait problem.  All other systems reviewed and are negative.   There were no vitals taken for this visit.There is no height or weight on file to calculate BMI.  General Appearance: NA  Eye Contact:  NA  Speech:  Clear and Coherent  Volume:  Normal  Mood:  Euthymic  Affect:  NA  Thought Process:  Goal Directed  Orientation:  Full (Time, Place, and Person)  Thought Content: Rumination   Suicidal Thoughts:  No  Homicidal Thoughts:  No  Memory:  Immediate;   Fair Recent;   Fair Remote;   Fair  Judgement:  Fair  Insight:  Shallow  Psychomotor Activity:  Decreased  Concentration:  Concentration: Fair and Attention Span: Fair  Recall:  [employer] of Knowledge: Fair  Language: Good  Akathisia:  No  Handed:  Right  AIMS (if indicated): not done  Assets:  Communication Skills Desire for Improvement Resilience Social Support  ADL's:  Intact  Cognition: Impaired,  Mild  Sleep:  Good   Screenings: MDI     Office Visit from 02/15/2016 in [facility]  Total Score (max 50) 17    Mini-Mental     Office Visit from 11/22/2019 in [facility] Visit from 09/06/2019 in [facility]  Total Score (max 30 points ) 21 (P)  22       Assessment and Plan: This patient is an 84 year old female with a history of depression anxiety and mild dementia probably due to vascular issues and numerous strokes.  She still seems fairly intact and satisfied with her current living situation as is her son.  She denies significant depression suicidal ideation confusion or agitation.  She will continue Lexapro 20 mg daily for depression as well as Effexor XR 150 mg twice daily for depression, trazodone 50 mg at bedtime for sleep, Risperdal 1 mg at bedtime for paranoid ideation and Xanax 0.5 mg daily as bedtime as needed for anxiety.  She will return to see me in 3 months   Levonne Spiller, MD 04/13/2020, 2:57  PM

## 2020-05-06 ENCOUNTER — Other Ambulatory Visit: Payer: Self-pay | Admitting: Student

## 2020-05-10 LAB — CUP PACEART REMOTE DEVICE CHECK
Date Time Interrogation Session: 20211013025248
Implantable Pulse Generator Implant Date: 20210325

## 2020-05-15 ENCOUNTER — Ambulatory Visit (INDEPENDENT_AMBULATORY_CARE_PROVIDER_SITE_OTHER): Payer: Medicare HMO

## 2020-05-15 DIAGNOSIS — I639 Cerebral infarction, unspecified: Secondary | ICD-10-CM

## 2020-05-19 ENCOUNTER — Telehealth: Payer: Self-pay | Admitting: Internal Medicine

## 2020-05-19 NOTE — Progress Notes (Signed)
[facility] Summary Report / Loop Recorder 

## 2020-05-19 NOTE — Telephone Encounter (Signed)
Spoke w patient's son.  He did not need to speak w me.  He is comfortable with the upcoming appointment 11/18.  He has checked the weight charts and patient's weights are stable.  Will call back if feels pt should be seen sooner or any other concerns arise.

## 2020-05-19 NOTE — Telephone Encounter (Signed)
Pt c/o swelling: STAT is pt has developed SOB within 24 hours  1) How much weight have you gained and in what time span? Has not seen any significant increase in weight   2) If swelling, where is the swelling located? Ankles and Feet   3) Are you currently taking a fluid pill? Not that he is aware of   4) Are you currently SOB? No   5) Do you have a log of your daily weights (if so, list)? Yes, but doesn't have them. Are kept by private sitter.  6) Have you gained 3 pounds in a day or 5 pounds in a week? Doesn't think so   7) Have you traveled recently? No   Amy [name] is requesting he be called back in regards to this due to his mother [name] having dementia & alzheimer's. He also states he is her power of attorney. Trinidy has been scheduled for 06/15/20 with Sharee Pimple in regards to this. Please advise.

## 2020-05-27 ENCOUNTER — Other Ambulatory Visit: Payer: Self-pay | Admitting: Adult Health

## 2020-06-12 ENCOUNTER — Ambulatory Visit (INDEPENDENT_AMBULATORY_CARE_PROVIDER_SITE_OTHER): Payer: Medicare HMO

## 2020-06-12 DIAGNOSIS — I639 Cerebral infarction, unspecified: Secondary | ICD-10-CM

## 2020-06-12 LAB — CUP PACEART REMOTE DEVICE CHECK
Date Time Interrogation Session: 20211115015301
Implantable Pulse Generator Implant Date: 20210325

## 2020-06-14 NOTE — Progress Notes (Signed)
[facility] Summary Report / Loop Recorder 

## 2020-06-15 ENCOUNTER — Ambulatory Visit: Payer: Medicare HMO | Admitting: Cardiology

## 2020-06-28 ENCOUNTER — Encounter: Payer: Self-pay | Admitting: Emergency Medicine

## 2020-06-28 ENCOUNTER — Ambulatory Visit
Admission: EM | Admit: 2020-06-28 | Discharge: 2020-06-28 | Disposition: A | Discharge status: Home / Self Care | Payer: Medicare HMO | Attending: Emergency Medicine | Admitting: Emergency Medicine

## 2020-06-28 DIAGNOSIS — N3001 Acute cystitis with hematuria: Secondary | ICD-10-CM | POA: Diagnosis not present

## 2020-06-28 LAB — POCT URINALYSIS DIP (MANUAL ENTRY)
Bilirubin, UA: NEGATIVE
Glucose, UA: NEGATIVE mg/dL
Ketones, POC UA: NEGATIVE mg/dL
Nitrite, UA: NEGATIVE
Protein Ur, POC: NEGATIVE mg/dL
Spec Grav, UA: >=1.03 — AB (ref 1.010–1.025)
Urobilinogen, UA: 1 E.U./dL
pH, UA: 6.5 (ref 5.0–8.0)

## 2020-06-28 MED ORDER — CEPHALEXIN 500 MG PO CAPS
500.0000 mg | ORAL_CAPSULE | Freq: Two times a day (BID) | ORAL | 0 refills | Status: AC
Start: 2020-06-28 — End: 2020-07-08

## 2020-06-28 NOTE — Discharge Instructions (Signed)
Will cover for UTI today based on urine and symptoms Urine culture sent.  We will call you with the results.   Push fluids and get plenty of rest.   Take antibiotic as directed and to completion Follow up with PCP for recheck this week or next week Return here or go to ER if you have any new or worsening symptoms such as fever, worsening abdominal pain, nausea/vomiting, flank pain, etc..Marland Kitchen

## 2020-06-28 NOTE — ED Provider Notes (Signed)
[facility]   CC: "UTI"  SUBJECTIVE: HPI obtained from patient, patient is a poor historian RUKIA MCGILLIVRAY is a 84 y.o. female who complains of urinary frequency, malodorous urine and dysuria for that began last night.  Patient denies a precipitating event.  Has NOT tried OTC medications.  Symptoms are made worse with urination.  Admits to similar symptoms in the past.  Denies fever, chills, nausea, vomiting, abdominal pain, flank pain, hematuria.    LMP: No LMP recorded. Patient is postmenopausal.  ROS: As in HPI.  All other pertinent ROS negative.     Past Medical History:  Diagnosis Date  . Anxiety   . Arthritis   . Back pain   . Colon polyps   . Dementia ([place])    early signs per daughter in law  . Depression   . Diabetes mellitus, type II ([place])    diet controlled  . Hypercholesterolemia   . Invasive ductal carcinoma of breast, female, right ([place])   . Osteopenia 11/26/2016  . Skin-picking disorder    has 3 open wounds on right wrist that are being treated. About a quarter in size.  . Stroke [facility])    Past Surgical History:  Procedure Laterality Date  . APPENDECTOMY    . BACK SURGERY    . BREAST BIOPSY Right 10/09/2016   Procedure: BREAST BIOPSY WITH NEEDLE LOCALIZATION;  Surgeon: [name], MD;  Location: [facility];  Service: General;  Laterality: Right;  . BREAST SURGERY Right   . BUBBLE STUDY  03/23/2019   Procedure: BUBBLE STUDY;  Surgeon: [name], MD;  Location: [facility] ENDOSCOPY;  Service: Cardiovascular;;  . BUNIONECTOMY Bilateral   . FACIAL COSMETIC SURGERY     drooping eyelid and brow  . HERNIA REPAIR Right    Inguinal x2  . implantable loop recorder placement  10/21/2019    Medtronic Reveal [place] model [id number]) implanted by Dr [name] for cryptogenic stroke indication  . INGUINAL LYMPH NODE BIOPSY Left 03/10/2020   Procedure: LEFT INGUINAL LYMPH NODE BIOPSY;  Surgeon: [name], MD;  Location: [facility];  Service: General;   Laterality: Left;  . KNEE ARTHROSCOPY Right   . SHOULDER SURGERY Right    rotator cuff repair and removal of bone spur  . TEE WITHOUT CARDIOVERSION N/A 03/23/2019   Procedure: TRANSESOPHAGEAL ECHOCARDIOGRAM (TEE);  Surgeon: [name], MD;  Location[facility]COPY;  Service: Cardiovascular;  Laterality: N/A;  . TEMPOROMANDIBULAR JOINT SURGERY Right   . TOTAL KNEE ARTHROPLASTY Right 05/05/2018   Procedure: TOTAL KNEE ARTHROPLASTY;  Surgeon: [name], MD;  Location: [facility];  Service: Orthopedics;  Laterality: Right;   Allergies  Allergen Reactions  . Cymbalta [Duloxetine Hcl] Other (See Comments)    Hallucinated and couldn't think; got worse and worse the longer she took this.   No current facility-administered medications on file prior to encounter.   Current Outpatient Medications on File Prior to Encounter  Medication Sig Dispense Refill  . ALPRAZolam (XANAX) 0.5 MG tablet Take 1 tablet (0.5 mg total) by mouth at bedtime. 30 tablet 2  . aspirin EC 81 MG tablet Take 81 mg by mouth every morning.    Marland Kitchen atorvastatin (LIPITOR) 40 MG tablet Take 1 tablet (40 mg total) by mouth daily at 6 PM. 90 tablet 2  . calcium carbonate (OSCAL) 1500 (600 Ca) MG TABS tablet Take 600 mg of elemental calcium by mouth daily with supper.     . cholecalciferol (VITAMIN D) 1000  units tablet Take 1 tablet (1,000 Units total) by mouth daily. 30 tablet 11  . cyanocobalamin (,VITAMIN B-12,) 1000 MCG/ML injection Daily for 4 days; then weekly for 1 month and then monthly after that. 12 mL 0  . donepezil (ARICEPT) 5 MG tablet Take 5 mg by mouth daily.    Marland Kitchen escitalopram (LEXAPRO) 20 MG tablet Take 1 tablet (20 mg total) by mouth daily. 30 tablet 2  . gabapentin (NEURONTIN) 300 MG capsule TAKE 1 CAPSULE(300 MG) BY MOUTH AT BEDTIME 30 capsule 2  . letrozole (FEMARA) 2.5 MG tablet TAKE 1 TABLET(2.5 MG) BY MOUTH DAILY 90 tablet 3  . losartan (COZAAR) 25 MG tablet TAKE 1/2 TABLET(12.5 MG) BY MOUTH AT  BEDTIME 45 tablet 1  . metFORMIN (GLUCOPHAGE) 500 MG tablet Take 1 tablet (500 mg total) by mouth 2 (two) times daily with a meal. 60 tablet 0  . metoprolol succinate (TOPROL XL) 25 MG 24 hr tablet Take 1 tablet (25 mg total) by mouth daily. 30 tablet 11  . ONETOUCH ULTRA test strip daily.    Marland Kitchen oxybutynin (DITROPAN) 5 MG tablet Take 5 mg by mouth at bedtime.    . risperiDONE (RISPERDAL) 1 MG tablet TAKE 1 TABLET(1 MG) BY MOUTH AT BEDTIME 30 tablet 2  . traZODone (DESYREL) 50 MG tablet Take 1 tablet (50 mg total) by mouth at bedtime. 30 tablet 2  . venlafaxine XR (EFFEXOR-XR) 150 MG 24 hr capsule TAKE 1 CAPSULE(150 MG) BY MOUTH TWICE DAILY 60 capsule 2   Social History   Socioeconomic History  . Marital status: Divorced    Spouse name: Not on file  . Number of children: Not on file  . Years of education: Not on file  . Highest education level: Not on file  Occupational History  . Occupation: Nurse    Comment: Retired  Tobacco Use  . Smoking status: Former Smoker    Packs/day: 0.50    Years: 40.00    Pack years: 20.00    Types: Cigarettes    Quit date: 09/04/1995    Years since quitting: 24.8  . Smokeless tobacco: Never Used  Vaping Use  . Vaping Use: Never used  Substance and Sexual Activity  . Alcohol use: No  . Drug use: No    Types: Benzodiazepines, Hydrocodone  . Sexual activity: Never  Other Topics Concern  . Not on file  Social History Narrative   Divorced.  Former smoker, quit in 1997.  Nondrinker.    Social Determinants of Health   Financial Resource Strain:   . Difficulty of Paying Living Expenses: Not on file  Food Insecurity:   . Worried About Charity fundraiser in the Last Year: Not on file  . Ran Out of Food in the Last Year: Not on file  Transportation Needs:   . Lack of Transportation (Medical): Not on file  . Lack of Transportation (Non-Medical): Not on file  Physical Activity:   . Days of Exercise per Week: Not on file  . Minutes of Exercise per  Session: Not on file  Stress:   . Feeling of Stress : Not on file  Social Connections:   . Frequency of Communication with Friends and Family: Not on file  . Frequency of Social Gatherings with Friends and Family: Not on file  . Attends Religious Services: Not on file  . Active Member of Clubs or Organizations: Not on file  . Attends Archivist Meetings: Not on file  . Marital Status:  Not on file  Intimate Partner Violence:   . Fear of Current or Ex-Partner: Not on file  . Emotionally Abused: Not on file  . Physically Abused: Not on file  . Sexually Abused: Not on file   Family History  Problem Relation Age of Onset  . Anxiety disorder Mother   . Depression Mother   . Depression Maternal Grandmother   . Alcohol abuse Brother   . Cancer - Other Sister   . ADD / ADHD Neg Hx   . Bipolar disorder Neg Hx   . Dementia Neg Hx   . Drug abuse Neg Hx   . OCD Neg Hx   . Paranoid behavior Neg Hx   . Schizophrenia Neg Hx   . Seizures Neg Hx   . Sexual abuse Neg Hx   . Physical abuse Neg Hx     OBJECTIVE:  Vitals:   06/28/20 1057  BP: 131/81  Pulse: 74  Resp: 18  Temp: 97.9 F (36.6 C)  TempSrc: Oral  SpO2: 94%   General appearance: Alert HEENT: NCAT.  Oropharynx clear.  Lungs: clear to auscultation bilaterally without adventitious breath sounds Heart: regular rate and rhythm.   Abdomen: soft; non-distended; no tenderness; bowel sounds present; no guarding or rebound tenderness Back: no CVA tenderness Extremities: no edema; symmetrical with no gross deformities Skin: warm and dry Neurologic: Ambulates with a walker Psychological: alert and cooperative; normal mood and affect  Labs Reviewed  POCT URINALYSIS DIP (MANUAL ENTRY) - Abnormal; Notable for the following components:      Result Value   Clarity, UA cloudy (*)    Spec Grav, UA >=1.030 (*)    Blood, UA trace-intact (*)    Leukocytes, UA Moderate (2+) (*)    All other components within normal limits   URINE CULTURE    ASSESSMENT & PLAN:  1. Acute cystitis with hematuria     Meds ordered this encounter  Medications  . cephALEXin (KEFLEX) 500 MG capsule    Sig: Take 1 capsule (500 mg total) by mouth 2 (two) times daily for 10 days.    Dispense:  20 capsule    Refill:  0    Order Specific Question:   Supervising Provider    Answer:   Raylene Everts [[id number]]   Will cover for UTI today based on urine and symptoms Urine culture sent.  We will call you with the results.   Push fluids and get plenty of rest.   Take antibiotic as directed and to completion Follow up with PCP for recheck this week or next week Return here or go to ER if you have any new or worsening symptoms such as fever, worsening abdominal pain, nausea/vomiting, flank pain, etc...  Outlined signs and symptoms indicating need for more acute intervention. Patient verbalized understanding. After Visit Summary given.     Lestine Box, PA-C 06/28/20 1114

## 2020-06-28 NOTE — ED Triage Notes (Signed)
Pt here d/t possible urinary tract infection, pt reports burning with urination. Unable to state when s/s started.

## 2020-06-29 ENCOUNTER — Other Ambulatory Visit: Payer: Self-pay | Admitting: Internal Medicine

## 2020-07-01 LAB — URINE CULTURE
Culture: >=100000 — AB
Special Requests: NORMAL

## 2020-07-03 ENCOUNTER — Other Ambulatory Visit (HOSPITAL_COMMUNITY): Payer: Self-pay | Admitting: Psychiatry

## 2020-07-03 NOTE — Telephone Encounter (Signed)
Call for appt in next month

## 2020-07-04 ENCOUNTER — Other Ambulatory Visit (HOSPITAL_COMMUNITY): Payer: Self-pay | Admitting: Surgery

## 2020-07-04 DIAGNOSIS — C50911 Malignant neoplasm of unspecified site of right female breast: Secondary | ICD-10-CM

## 2020-07-04 MED ORDER — LETROZOLE 2.5 MG PO TABS: ORAL_TABLET | ORAL | 3 refills | Status: DC

## 2020-07-05 ENCOUNTER — Other Ambulatory Visit (HOSPITAL_COMMUNITY): Payer: Medicare HMO

## 2020-07-05 ENCOUNTER — Ambulatory Visit (HOSPITAL_COMMUNITY): Payer: Medicare HMO | Admitting: Hematology

## 2020-07-05 ENCOUNTER — Ambulatory Visit (HOSPITAL_COMMUNITY): Payer: Medicare HMO

## 2020-07-10 ENCOUNTER — Encounter (HOSPITAL_COMMUNITY): Payer: Self-pay | Admitting: Psychiatry

## 2020-07-10 ENCOUNTER — Emergency Department (HOSPITAL_COMMUNITY): Payer: Medicare HMO

## 2020-07-10 ENCOUNTER — Ambulatory Visit (HOSPITAL_COMMUNITY): Payer: Medicare HMO | Admitting: Hematology

## 2020-07-10 ENCOUNTER — Other Ambulatory Visit: Payer: Self-pay

## 2020-07-10 ENCOUNTER — Ambulatory Visit (HOSPITAL_COMMUNITY): Payer: Medicare HMO

## 2020-07-10 ENCOUNTER — Inpatient Hospital Stay (HOSPITAL_COMMUNITY)
Admission: EM | Admit: 2020-07-10 | Discharge: 2020-07-14 | DRG: 092 | Disposition: A | Discharge status: Home Health | Payer: Medicare HMO | Attending: Family Medicine | Admitting: Family Medicine

## 2020-07-10 ENCOUNTER — Telehealth (INDEPENDENT_AMBULATORY_CARE_PROVIDER_SITE_OTHER): Payer: Medicare HMO | Admitting: Psychiatry

## 2020-07-10 ENCOUNTER — Other Ambulatory Visit (HOSPITAL_COMMUNITY): Payer: Medicare HMO

## 2020-07-10 DIAGNOSIS — F419 Anxiety disorder, unspecified: Secondary | ICD-10-CM | POA: Diagnosis present

## 2020-07-10 DIAGNOSIS — F330 Major depressive disorder, recurrent, mild: Secondary | ICD-10-CM | POA: Diagnosis not present

## 2020-07-10 DIAGNOSIS — R479 Unspecified speech disturbances: Secondary | ICD-10-CM | POA: Diagnosis present

## 2020-07-10 DIAGNOSIS — Z20822 Contact with and (suspected) exposure to covid-19: Secondary | ICD-10-CM | POA: Diagnosis present

## 2020-07-10 DIAGNOSIS — Z7982 Long term (current) use of aspirin: Secondary | ICD-10-CM

## 2020-07-10 DIAGNOSIS — I110 Hypertensive heart disease with heart failure: Secondary | ICD-10-CM | POA: Diagnosis present

## 2020-07-10 DIAGNOSIS — G8929 Other chronic pain: Secondary | ICD-10-CM | POA: Diagnosis present

## 2020-07-10 DIAGNOSIS — I639 Cerebral infarction, unspecified: Secondary | ICD-10-CM | POA: Diagnosis present

## 2020-07-10 DIAGNOSIS — Z79899 Other long term (current) drug therapy: Secondary | ICD-10-CM

## 2020-07-10 DIAGNOSIS — M8580 Other specified disorders of bone density and structure, unspecified site: Secondary | ICD-10-CM | POA: Diagnosis present

## 2020-07-10 DIAGNOSIS — T50915A Adverse effect of multiple unspecified drugs, medicaments and biological substances, initial encounter: Secondary | ICD-10-CM | POA: Diagnosis present

## 2020-07-10 DIAGNOSIS — Z811 Family history of alcohol abuse and dependence: Secondary | ICD-10-CM

## 2020-07-10 DIAGNOSIS — R4182 Altered mental status, unspecified: Secondary | ICD-10-CM | POA: Diagnosis not present

## 2020-07-10 DIAGNOSIS — I5022 Chronic systolic (congestive) heart failure: Secondary | ICD-10-CM | POA: Diagnosis present

## 2020-07-10 DIAGNOSIS — Z818 Family history of other mental and behavioral disorders: Secondary | ICD-10-CM

## 2020-07-10 DIAGNOSIS — L7634 Postprocedural seroma of skin and subcutaneous tissue following other procedure: Secondary | ICD-10-CM | POA: Diagnosis present

## 2020-07-10 DIAGNOSIS — G9341 Metabolic encephalopathy: Secondary | ICD-10-CM | POA: Diagnosis present

## 2020-07-10 DIAGNOSIS — E1149 Type 2 diabetes mellitus with other diabetic neurological complication: Secondary | ICD-10-CM | POA: Diagnosis present

## 2020-07-10 DIAGNOSIS — G928 Other toxic encephalopathy: Principal | ICD-10-CM | POA: Diagnosis present

## 2020-07-10 DIAGNOSIS — C50911 Malignant neoplasm of unspecified site of right female breast: Secondary | ICD-10-CM | POA: Diagnosis present

## 2020-07-10 DIAGNOSIS — Z7984 Long term (current) use of oral hypoglycemic drugs: Secondary | ICD-10-CM

## 2020-07-10 DIAGNOSIS — F0391 Unspecified dementia with behavioral disturbance: Secondary | ICD-10-CM | POA: Diagnosis present

## 2020-07-10 DIAGNOSIS — Z79811 Long term (current) use of aromatase inhibitors: Secondary | ICD-10-CM

## 2020-07-10 DIAGNOSIS — Z96651 Presence of right artificial knee joint: Secondary | ICD-10-CM | POA: Diagnosis present

## 2020-07-10 DIAGNOSIS — Z87891 Personal history of nicotine dependence: Secondary | ICD-10-CM

## 2020-07-10 DIAGNOSIS — R410 Disorientation, unspecified: Secondary | ICD-10-CM | POA: Diagnosis present

## 2020-07-10 DIAGNOSIS — M549 Dorsalgia, unspecified: Secondary | ICD-10-CM | POA: Diagnosis present

## 2020-07-10 DIAGNOSIS — F424 Excoriation (skin-picking) disorder: Secondary | ICD-10-CM | POA: Diagnosis present

## 2020-07-10 DIAGNOSIS — Y838 Other surgical procedures as the cause of abnormal reaction of the patient, or of later complication, without mention of misadventure at the time of the procedure: Secondary | ICD-10-CM | POA: Diagnosis present

## 2020-07-10 DIAGNOSIS — F32A Depression, unspecified: Secondary | ICD-10-CM | POA: Diagnosis present

## 2020-07-10 DIAGNOSIS — Y92009 Unspecified place in unspecified non-institutional (private) residence as the place of occurrence of the external cause: Secondary | ICD-10-CM

## 2020-07-10 DIAGNOSIS — R4189 Other symptoms and signs involving cognitive functions and awareness: Secondary | ICD-10-CM | POA: Diagnosis present

## 2020-07-10 DIAGNOSIS — I69328 Other speech and language deficits following cerebral infarction: Secondary | ICD-10-CM

## 2020-07-10 DIAGNOSIS — C8290 Follicular lymphoma, unspecified, unspecified site: Secondary | ICD-10-CM | POA: Diagnosis present

## 2020-07-10 DIAGNOSIS — E7800 Pure hypercholesterolemia, unspecified: Secondary | ICD-10-CM | POA: Diagnosis present

## 2020-07-10 LAB — CBC WITH DIFFERENTIAL/PLATELET
Abs Immature Granulocytes: 0.02 10*3/uL (ref 0.00–0.07)
Basophils Absolute: 0 10*3/uL (ref 0.0–0.1)
Basophils Relative: 0 %
Eosinophils Absolute: 0.1 10*3/uL (ref 0.0–0.5)
Eosinophils Relative: 1 %
HCT: 37.9 % (ref 36.0–46.0)
Hemoglobin: 12.5 g/dL (ref 12.0–15.0)
Immature Granulocytes: 0 %
Lymphocytes Relative: 15 %
Lymphs Abs: 1.3 10*3/uL (ref 0.7–4.0)
MCH: 29.9 pg (ref 26.0–34.0)
MCHC: 33 g/dL (ref 30.0–36.0)
MCV: 90.7 fL (ref 80.0–100.0)
Monocytes Absolute: 0.6 10*3/uL (ref 0.1–1.0)
Monocytes Relative: 8 %
Neutro Abs: 6.4 10*3/uL (ref 1.7–7.7)
Neutrophils Relative %: 76 %
Platelets: 239 10*3/uL (ref 150–400)
RBC: 4.18 MIL/uL (ref 3.87–5.11)
RDW: 12.4 % (ref 11.5–15.5)
WBC: 8.5 10*3/uL (ref 4.0–10.5)
nRBC: 0 % (ref 0.0–0.2)

## 2020-07-10 LAB — URINALYSIS, ROUTINE W REFLEX MICROSCOPIC
Bilirubin Urine: NEGATIVE
Glucose, UA: NEGATIVE mg/dL
Hgb urine dipstick: NEGATIVE
Ketones, ur: 20 mg/dL — AB
Leukocytes,Ua: NEGATIVE
Nitrite: POSITIVE — AB
Protein, ur: NEGATIVE mg/dL
Specific Gravity, Urine: 1.014 (ref 1.005–1.030)
pH: 7 (ref 5.0–8.0)

## 2020-07-10 LAB — CBG MONITORING, ED: Glucose-Capillary: 91 mg/dL (ref 70–99)

## 2020-07-10 LAB — COMPREHENSIVE METABOLIC PANEL
ALT: 12 U/L (ref 0–44)
AST: 20 U/L (ref 15–41)
Albumin: 4.1 g/dL (ref 3.5–5.0)
Alkaline Phosphatase: 47 U/L (ref 38–126)
Anion gap: 11 (ref 5–15)
BUN: 19 mg/dL (ref 8–23)
CO2: 27 mmol/L (ref 22–32)
Calcium: 9.8 mg/dL (ref 8.9–10.3)
Chloride: 97 mmol/L — ABNORMAL LOW (ref 98–111)
Creatinine, Ser: 0.8 mg/dL (ref 0.44–1.00)
GFR, Estimated: >60 mL/min (ref >60)
Glucose, Bld: 96 mg/dL (ref 70–99)
Potassium: 3.7 mmol/L (ref 3.5–5.1)
Sodium: 135 mmol/L (ref 135–145)
Total Bilirubin: 0.8 mg/dL (ref 0.3–1.2)
Total Protein: 6.6 g/dL (ref 6.5–8.1)

## 2020-07-10 LAB — RESP PANEL BY RT-PCR (FLU A&B, COVID) ARPGX2
Influenza A by PCR: NEGATIVE
Influenza B by PCR: NEGATIVE
SARS Coronavirus 2 by RT PCR: NEGATIVE

## 2020-07-10 LAB — TROPONIN I (HIGH SENSITIVITY)
Troponin I (High Sensitivity): 7 ng/L (ref <18)
Troponin I (High Sensitivity): 9 ng/L (ref <18)

## 2020-07-10 MED ORDER — ALPRAZOLAM 0.5 MG PO TABS: 0.5000 mg | ORAL_TABLET | Freq: Every day | ORAL | 2 refills | Status: DC

## 2020-07-10 MED ORDER — LETROZOLE 2.5 MG PO TABS
2.5000 mg | ORAL_TABLET | Freq: Every day | ORAL | Status: DC
  Administered 2020-07-11 – 2020-07-14 (×4): 2.5 mg via ORAL
  Filled 2020-07-10 (×8): qty 1

## 2020-07-10 MED ORDER — ALPRAZOLAM 0.5 MG PO TABS
0.5000 mg | ORAL_TABLET | Freq: Every day | ORAL | Status: DC
  Administered 2020-07-11 – 2020-07-13 (×3): 0.5 mg via ORAL
  Filled 2020-07-10 (×4): qty 1

## 2020-07-10 MED ORDER — ONDANSETRON HCL 4 MG/2ML IJ SOLN: 4.0000 mg | Freq: Four times a day (QID) | INTRAMUSCULAR | Status: DC | PRN

## 2020-07-10 MED ORDER — ESCITALOPRAM OXALATE 20 MG PO TABS: 20.0000 mg | ORAL_TABLET | Freq: Every day | ORAL | 2 refills | Status: DC

## 2020-07-10 MED ORDER — GABAPENTIN 300 MG PO CAPS: 300.0000 mg | ORAL_CAPSULE | Freq: Every day | ORAL | 2 refills | Status: DC

## 2020-07-10 MED ORDER — ENOXAPARIN SODIUM 40 MG/0.4ML ~~LOC~~ SOLN
40.0000 mg | SUBCUTANEOUS | Status: DC
  Administered 2020-07-10 – 2020-07-13 (×4): 40 mg via SUBCUTANEOUS
  Filled 2020-07-10 (×4): qty 0.4

## 2020-07-10 MED ORDER — INSULIN ASPART 100 UNIT/ML ~~LOC~~ SOLN
0.0000 [IU] | Freq: Every day | SUBCUTANEOUS | Status: DC
  Administered 2020-07-11: 2 [IU] via SUBCUTANEOUS

## 2020-07-10 MED ORDER — TRAZODONE HCL 50 MG PO TABS
50.0000 mg | ORAL_TABLET | Freq: Every day | ORAL | Status: DC
  Administered 2020-07-10 – 2020-07-11 (×2): 50 mg via ORAL
  Filled 2020-07-10 (×2): qty 1

## 2020-07-10 MED ORDER — VENLAFAXINE HCL ER 150 MG PO CP24: ORAL_CAPSULE | ORAL | 2 refills | Status: DC

## 2020-07-10 MED ORDER — ASPIRIN EC 81 MG PO TBEC
81.0000 mg | DELAYED_RELEASE_TABLET | Freq: Every morning | ORAL | Status: DC
  Administered 2020-07-11 – 2020-07-14 (×4): 81 mg via ORAL
  Filled 2020-07-10 (×4): qty 1

## 2020-07-10 MED ORDER — ACETAMINOPHEN 650 MG RE SUPP: 650.0000 mg | Freq: Four times a day (QID) | RECTAL | Status: DC | PRN

## 2020-07-10 MED ORDER — RISPERIDONE 1 MG PO TABS: ORAL_TABLET | ORAL | 2 refills | Status: DC

## 2020-07-10 MED ORDER — TRAZODONE HCL 50 MG PO TABS: 50.0000 mg | ORAL_TABLET | Freq: Every day | ORAL | 2 refills | Status: DC

## 2020-07-10 MED ORDER — INSULIN ASPART 100 UNIT/ML ~~LOC~~ SOLN
0.0000 [IU] | Freq: Three times a day (TID) | SUBCUTANEOUS | Status: DC
  Administered 2020-07-11 (×2): 1 [IU] via SUBCUTANEOUS
  Administered 2020-07-12: 2 [IU] via SUBCUTANEOUS
  Administered 2020-07-12 – 2020-07-13 (×3): 3 [IU] via SUBCUTANEOUS
  Administered 2020-07-13: 2 [IU] via SUBCUTANEOUS
  Administered 2020-07-13: 5 [IU] via SUBCUTANEOUS
  Administered 2020-07-14: 2 [IU] via SUBCUTANEOUS

## 2020-07-10 MED ORDER — ONDANSETRON HCL 4 MG PO TABS: 4.0000 mg | ORAL_TABLET | Freq: Four times a day (QID) | ORAL | Status: DC | PRN

## 2020-07-10 MED ORDER — ACETAMINOPHEN 325 MG PO TABS
650.0000 mg | ORAL_TABLET | Freq: Four times a day (QID) | ORAL | Status: DC | PRN
  Administered 2020-07-12 – 2020-07-13 (×4): 650 mg via ORAL
  Filled 2020-07-10 (×4): qty 2

## 2020-07-10 MED ORDER — ALPRAZOLAM 0.5 MG PO TABS
0.5000 mg | ORAL_TABLET | Freq: Once | ORAL | Status: AC
  Administered 2020-07-10: 0.5 mg via ORAL
  Filled 2020-07-10: qty 1

## 2020-07-10 NOTE — ED Notes (Signed)
ED Provider at bedside. 

## 2020-07-10 NOTE — ED Notes (Signed)
Made aware by U/S tech that pt has nodule to left groin with well healing scar noted.  No redness or swelling noted.  PA informed.

## 2020-07-10 NOTE — ED Triage Notes (Signed)
Pt. Has been on antibiotics for a UTI for three weeks. Pt. Has been having hallucinations and confusion since Saturday.

## 2020-07-10 NOTE — ED Provider Notes (Signed)
[facility] EMERGENCY DEPARTMENT Provider Note   CSN: [id number] Arrival date & time: 07/10/20  1215     History Chief Complaint  Patient presents with  . Altered Mental Status    Amy [name] is a 84 y.o. female.  HPI   84 year old female with a history of anxiety, dementia, depression, diabetes, hypercholesterolemia, CVA, who presents to the emergency department today with her son for evaluation of altered mental status.  Son is at bedside and states that over the last 3 weeks patient has been altered.  She has been more confused than normal, has had difficulty with word finding, has had hallucinations and also experiencing paranoia as well.  He states that at baseline she does have some dementia but she normally is oriented to self place and date.  He states originally they thought she had a urinary tract infection, she was seen in urgent care and given a prescription for antibiotics however after finishing antibiotics and following up with her PCP her symptoms have not resolved.  She is still having difficulty finding her words and has also been wandering outside of her home.  At this time the patient denies any fevers, cough, chest pain, shortness of breath, abdominal pain, vomiting or diarrhea.  She is complaining of some back pain which is chronic in nature per her son.  Past Medical History:  Diagnosis Date  . Anxiety   . Arthritis   . Back pain   . Colon polyps   . Dementia ([place])    early signs per daughter in law  . Depression   . Diabetes mellitus, type II ([place])    diet controlled  . Hypercholesterolemia   . Invasive ductal carcinoma of breast, female, right ([place])   . Osteopenia 11/26/2016  . Skin-picking disorder    has 3 open wounds on right wrist that are being treated. About a quarter in size.  . Stroke [facility])     Patient Active Problem List   Diagnosis Date Noted  . Lymphadenopathy, inguinal   . Lymphadenopathy, retroperitoneal 02/16/2020  . Cryptogenic  stroke ([place]) 10/21/2019  . Palpitations 10/21/2019  . Cognitive impairment   . UTI (urinary tract infection) 10/05/2019  . Dehydration   . Major depressive disorder, recurrent episode, mild with anxious distress ([place])   . Acute lower UTI   . Deficiency of vitamin B12   . Physical deconditioning   . Acute metabolic encephalopathy 53/66/4403  . Acute encephalopathy 08/16/2019  . Mild renal insufficiency 08/16/2019  . Chronic systolic CHF (congestive heart failure) ([place]) 08/16/2019  . Cardiac LV ejection fraction 30-35% 03/29/2019  . Dyslipidemia associated with type 2 diabetes mellitus ([place]) 03/29/2019  . Major depression with psychotic features ([place]) 03/29/2019  . History of CVA (cerebrovascular accident) 03/20/2019  . Edema of both lower legs due to peripheral venous insufficiency 10/12/2018  . Anemia 05/12/2018  . S/P total knee replacement, right 05/05/18 05/12/2018  . Type 2 diabetes mellitus with neurological complications ([place]) 47/42/5956  . Primary osteoarthritis of right knee   . Status post surgery 01/26/2018  . Surgery, elective 01/26/2018  . Degenerative spondylolisthesis 01/26/2018  . Osteoporosis 12/13/2016  . Osteopenia 11/26/2016  . Invasive ductal carcinoma of breast, female, right ([place])   . Lumbago 03/22/2014  . Stiffness of joint, not elsewhere classified, pelvic region and thigh 03/22/2014  . Muscle weakness (generalized) 03/22/2014  . Abnormality of gait 03/22/2014  . Depression with anxiety 09/03/2012  . Insomnia secondary to depression with anxiety 09/03/2012  .  Pain 09/03/2012    Past Surgical History:  Procedure Laterality Date  . APPENDECTOMY    . BACK SURGERY    . BREAST BIOPSY Right 10/09/2016   Procedure: BREAST BIOPSY WITH NEEDLE LOCALIZATION;  Surgeon: [name], MD;  Location: [facility] ORS;  Service: General;  Laterality: Right;  . BREAST SURGERY Right   . BUBBLE STUDY  03/23/2019   Procedure: BUBBLE STUDY;  Surgeon: [name], MD;   Location: [facility] ENDOSCOPY;  Service: Cardiovascular;;  . BUNIONECTOMY Bilateral   . FACIAL COSMETIC SURGERY     drooping eyelid and brow  . HERNIA REPAIR Right    Inguinal x2  . implantable loop recorder placement  10/21/2019    [employer]onic Reveal [facility] model LNQ [id number]) implanted by Dr [name] for cryptogenic stroke indication  . INGUINAL LYMPH NODE BIOPSY Left 03/10/2020   Procedure: LEFT INGUINAL LYMPH NODE BIOPSY;  Surgeon: [name], MD;  Location: [facility] ORS;  Service: General;  Laterality: Left;  . KNEE ARTHROSCOPY Right   . SHOULDER SURGERY Right    rotator cuff repair and removal of bone spur  . TEE WITHOUT CARDIOVERSION N/A 03/23/2019   Procedure: TRANSESOPHAGEAL ECHOCARDIOGRAM (TEE);  Surgeon: [name], MD;  Location: [facility] ENDOSCOPY;  Service: Cardiovascular;  Laterality: N/A;  . TEMPOROMANDIBULAR JOINT SURGERY Right   . TOTAL KNEE ARTHROPLASTY Right 05/05/2018   Procedure: TOTAL KNEE ARTHROPLASTY;  Surgeon: [name], MD;  Location: [facility] ORS;  Service: Orthopedics;  Laterality: Right;     OB History   No obstetric history on file.     Family History  Problem Relation Age of Onset  . Anxiety disorder Mother   . Depression Mother   . Depression Maternal Grandmother   . Alcohol abuse Brother   . Cancer - Other Sister   . ADD / ADHD Neg Hx   . Bipolar disorder Neg Hx   . Dementia Neg Hx   . Drug abuse Neg Hx   . OCD Neg Hx   . Paranoid behavior Neg Hx   . Schizophrenia Neg Hx   . Seizures Neg Hx   . Sexual abuse Neg Hx   . Physical abuse Neg Hx     Social History   Tobacco Use  . Smoking status: Former Smoker    Packs/day: 0.50    Years: 40.00    Pack years: 20.00    Types: Cigarettes    Quit date: 09/04/1995    Years since quitting: 24.8  . Smokeless tobacco: Never Used  Vaping Use  . Vaping Use: Never used  Substance Use Topics  . Alcohol use: No  . Drug use: No    Types: Benzodiazepines, Hydrocodone    Home Medications Prior to  Admission medications   Medication Sig Start Date End Date Taking? Authorizing Provider  ALPRAZolam Duanne Moron) 0.5 MG tablet Take 1 tablet (0.5 mg total) by mouth at bedtime. 07/10/20  Yes Cloria Spring, MD  aspirin EC 81 MG tablet Take 81 mg by mouth every morning.   Yes [provider]  calcium carbonate (OSCAL) 1500 (600 Ca) MG TABS tablet Take 600 mg of elemental calcium by mouth daily with supper.    Yes [provider]  cholecalciferol (VITAMIN D) 1000 units tablet Take 1 tablet (1,000 Units total) by mouth daily. 10/23/16  Yes Baird Cancer, PA-C  cyanocobalamin (,VITAMIN B-12,) 1000 MCG/ML injection Daily for 4 days; then weekly for 1 month and then monthly after that. 08/19/19  Yes Dyann Kief,  Clifton James, MD  donepezil (ARICEPT) 5 MG tablet Take 5 mg by mouth daily. 01/18/20  Yes [provider]  escitalopram (LEXAPRO) 20 MG tablet Take 1 tablet (20 mg total) by mouth daily. 07/10/20  Yes Cloria Spring, MD  gabapentin (NEURONTIN) 300 MG capsule Take 1 capsule (300 mg total) by mouth at bedtime. 07/10/20  Yes Cloria Spring, MD  ibuprofen (ADVIL) 800 MG tablet Take 800 mg by mouth 3 (three) times daily as needed. 05/29/20  Yes [provider]  letrozole ([place]) 2.5 MG tablet TAKE 1 TABLET(2.5 MG) BY MOUTH DAILY 07/04/20  Yes Nicholas Lose, MD  losartan (COZAAR) 25 MG tablet TAKE 1/2 TABLET(12.5 MG) BY MOUTH AT BEDTIME Patient taking differently: Take 25 mg by mouth at bedtime. 05/09/20  Yes Allred, Jeneen Rinks, MD  metFORMIN (GLUCOPHAGE) 500 MG tablet Take 1 tablet (500 mg total) by mouth 2 (two) times daily with a meal. 04/09/19  Yes Gerlene Fee, NP  metoprolol succinate (TOPROL-XL) 25 MG 24 hr tablet Take 1 tablet (25 mg total) by mouth daily. Please keep upcoming appt in January 2022 before anymore refills. Thank you 06/29/20  Yes Fay Records, MD  oxybutynin (DITROPAN) 5 MG tablet Take 5 mg by mouth at bedtime. 11/30/19  Yes [provider]  risperiDONE  (RISPERDAL) 1 MG tablet TAKE 1 TABLET(1 MG) BY MOUTH AT BEDTIME 07/10/20  Yes Cloria Spring, MD  traZODone (DESYREL) 50 MG tablet Take 1 tablet (50 mg total) by mouth at bedtime. 07/10/20  Yes Cloria Spring, MD  venlafaxine XR (EFFEXOR-XR) 150 MG 24 hr capsule TAKE 1 CAPSULE(150 MG) BY MOUTH TWICE DAILY 07/10/20  Yes Cloria Spring, MD  atorvastatin (LIPITOR) 40 MG tablet Take 1 tablet (40 mg total) by mouth daily at 6 PM. Patient not taking: No sig reported 09/06/19   Frann Rider, NP  [facility] ULTRA test strip daily. 03/09/20   [provider]    Allergies    Cymbalta [duloxetine hcl]  Review of Systems   Review of Systems  Unable to perform ROS: Dementia    Physical Exam Updated Vital Signs BP (!) 168/85 (BP Location: Right Arm)   Pulse 67   Temp 98.5 F (36.9 C) (Oral)   Resp 17   Ht 5\' 6"  (1.676 m)   Wt 74.6 kg   SpO2 95%   BMI 26.55 kg/m   Physical Exam Vitals and nursing note reviewed.  Constitutional:      General: She is not in acute distress.    Appearance: She is well-developed and well-nourished.  HENT:     Head: Normocephalic and atraumatic.     Mouth/Throat:     Mouth: Mucous membranes are dry.  Eyes:     Conjunctiva/sclera: Conjunctivae normal.  Cardiovascular:     Rate and Rhythm: Normal rate and regular rhythm.     Heart sounds: Normal heart sounds. No murmur heard.   Pulmonary:     Effort: Pulmonary effort is normal. No respiratory distress.     Breath sounds: Normal breath sounds. No wheezing, rhonchi or rales.  Abdominal:     General: Bowel sounds are normal.     Palpations: Abdomen is soft.     Tenderness: There is no abdominal tenderness. There is no guarding or rebound.  Musculoskeletal:     Cervical back: Neck supple.     Right lower leg: No edema.     Left lower leg: Edema present.     Comments: No ttp to  the CTL spine. Firm mobile mass to the left inguinal area  Skin:    General: Skin is warm and dry.  Neurological:      Mental Status: She is alert.     Comments: Mental Status:  Alert, oriented to self only. Some aphasia noted on exam and answers questions inappropriately. Able to follow simple commands Cranial Nerves:  II: pupils equal, round, reactive to light III,IV, VI: ptosis not present, extra-ocular motions intact bilaterally  V,VII: smile symmetric, facial light touch sensation equal VIII: hearing grossly normal to voice  X: uvula elevates symmetrically  XI: bilateral shoulder shrug symmetric and strong XII: midline tongue extension without fassiculations Motor:  Normal tone. 5/5 strength of BUE and BLE major muscle groups including strong and equal grip strength and dorsiflexion/plantar flexion Sensory: light touch normal in all extremities.   Psychiatric:        Mood and Affect: Mood and affect normal.     ED Results / Procedures / Treatments   Labs (all labs ordered are listed, but only abnormal results are displayed) Labs Reviewed  COMPREHENSIVE METABOLIC PANEL - Abnormal; Notable for the following components:      Result Value   Chloride 97 (*)    All other components within normal limits  CBC WITH DIFFERENTIAL/PLATELET  URINALYSIS, ROUTINE W REFLEX MICROSCOPIC  TROPONIN I (HIGH SENSITIVITY)  TROPONIN I (HIGH SENSITIVITY)    EKG None  Radiology DG Chest 1 View  Result Date: 07/10/2020 CLINICAL DATA:  84 year old female with altered mental status. EXAM: CHEST  1 VIEW COMPARISON:  Chest radiograph dated 12/11/2019. FINDINGS: Minimal left lung base atelectasis. No focal consolidation, pleural effusion, pneumothorax. Stable cardiac silhouette. Atherosclerotic calcification of the aorta. Loop recorder device. No acute osseous pathology. Partially visualized thoracolumbar fusion hardware. IMPRESSION: No active cardiopulmonary disease. Electronically Signed   By: Anner Crete M.D.   On: 07/10/2020 17:05   CT Head Wo Contrast  Result Date: 07/10/2020 CLINICAL DATA:   Hallucinations, confusion. EXAM: CT HEAD WITHOUT CONTRAST TECHNIQUE: Contiguous axial images were obtained from the base of the skull through the vertex without intravenous contrast. COMPARISON:  Dec 11, 2019. FINDINGS: Brain: Mild chronic ischemic white matter disease is noted. No mass effect or midline shift is noted. Ventricular size is within normal limits. There is no evidence of mass lesion, hemorrhage or acute infarction. Vascular: No hyperdense vessel or unexpected calcification. Skull: Normal. Negative for fracture or focal lesion. Sinuses/Orbits: No acute finding. Other: None. IMPRESSION: Mild chronic ischemic white matter disease. No acute intracranial abnormality seen. Electronically Signed   By: Marijo Conception M.D.   On: 07/10/2020 17:57   US Venous Img Lower  Left (DVT Study)  Result Date: 07/10/2020 CLINICAL DATA:  Pain and swelling LEFT leg, altered mental status EXAM: LEFT LOWER EXTREMITY VENOUS DOPPLER ULTRASOUND TECHNIQUE: Gray-scale sonography with compression, as well as color and duplex ultrasound, were performed to evaluate the deep venous system(s) from the level of the common femoral vein through the popliteal and proximal calf veins. COMPARISON:  06/04/2019 Correlation: PET-CT 02/14/2020 FINDINGS: VENOUS Normal compressibility of the common femoral, superficial femoral, and popliteal veins, as well as the visualized calf veins. Visualized portions of profunda femoral vein and great saphenous vein unremarkable. No filling defects to suggest DVT on grayscale or color Doppler imaging. Doppler waveforms show normal direction of venous flow, normal respiratory plasticity and response to augmentation. Limited views of the contralateral common femoral vein are unremarkable. OTHER Large multiloculated cystic mass in the LEFT  inguinal region 10.8 x 4.0 x 5.7 cm in size; patient had multiple enlarged hypermetabolic LEFT inguinal lymph nodes at this site on prior PET-CT exam, chart indicating  prior LEFT inguinal lymph node excisions on 03/10/2020 consistent with a postoperative collection. Limitations: None IMPRESSION: No evidence of deep venous thrombosis in the LEFT lower extremity. Large multiloculated fluid collection in the LEFT inguinal region 10.8 x 4.0 x 5.7 cm most likely representing a postoperative collection; this could represent lymphocele, seroma, or old hematoma. Electronically Signed   By: Lavonia Dana M.D.   On: 07/10/2020 17:45    Procedures Procedures (including critical care time)  Medications Ordered in ED Medications  ALPRAZolam Duanne Moron) tablet 0.5 mg (0.5 mg Oral Given 07/10/20 1928)    ED Course  I have reviewed the triage vital signs and the nursing notes.  Pertinent labs & imaging results that were available during my care of the patient were reviewed by me and considered in my medical decision making (see chart for details).    MDM Rules/Calculators/A&P                          84 year old female presenting emergency department today for evaluation of AMS ongoing for 3 weeks  Reviewed/interpreted labs Cbc unremarkable CMP unremarkable Trop neg UA pending on admission  EKG with afib and prolonged qtc  Reviewed/interpreted imaging CT head - Mild chronic ischemic white matter disease. No acute intracranial abnormality seen. CXR -  No active cardiopulmonary disease. RLE [place] - No evidence of deep venous thrombosis in the LEFT lower extremity. Large multiloculated fluid collection in the LEFT inguinal region 10.8 x 4.0 x 5.7 cm most likely representing a postoperative collection; this could represent lymphocele, seroma, or old hematoma.  7:15 PM CONSULT with Dr. Rory Percy of neurology who recommends that pt have MRI w/wo contrast.   7:40 PM CONSULT with Dr. Denton Brick who accepts patient for admission   Final Clinical Impression(s) / ED Diagnoses Final diagnoses:  Altered mental status, unspecified altered mental status type    Rx / DC Orders ED  Discharge Orders    None       Bishop [place] 07/10/20 1949    Milton Ferguson, MD 07/11/20 2216

## 2020-07-10 NOTE — H&P (Addendum)
History and Physical    Amy [name] VOZ:[id number] DOB: 08/23/1935 DOA: 07/10/2020  PCP: [name], MD   Patient coming from: Home  I have personally briefly reviewed patient's old medical records in [place]  Chief Complaint: Difficulty with speech  HPI: Amy [name] is a 84 y.o. female with medical history significant for systolic CHF with EF of 30 to 35%, cryptogenic stroke, diabetes mellitus, cognitive impairments. Patient was brought to the ED with complaints of paranoia, hallucinations, confusion, difficulty speaking, wandering.  Patient had a follow-up visit with telepsych today, per notes patient was very confused, patient wandered out of her house with her dog a couple of nights ago, and was seen by neighbor and returned home.  This morning she was trying to leave the house again.  She is also having a lot of word finding difficulty. On my evaluation patient is awake alert able to give me some history.  Initially her speech was moderately fluent when talking to me, but gradually she started having progressive difficulty getting her words out.  She tells me she has had a stroke before, but her speech is worse today.  Patient was treated for urinary tract infection 12/1 ED visit,  She had urinary symptoms, she was prescribed a course of cephalexin. Subsequent urine cultures showed sensitivity to cephalosporins resistance to ampicillin, ampicillin/sulbactam.  Today patient denies dysuria, she denies chest pain or difficulty breathing or pain anywhere.  ED Course: Blood pressure systolic 425Z to 563O, O2 sats greater than 92% on room air.  Temperature 98.5.  UA with positive nitrite rare bacteria.  Unremarkable CBC BMP.  Head  CT without acute abnormality.  Portable chest x-ray without acute disease.  Left venous ultrasound negative for DVT, shows large multiloculated fluid collection in the left groin.  Review of Systems: As per HPI all other systems reviewed and  negative.  Past Medical History:  Diagnosis Date  . Anxiety   . Arthritis   . Back pain   . Colon polyps   . Dementia ([place])    early signs per daughter in law  . Depression   . Diabetes mellitus, type II ([place])    diet controlled  . Hypercholesterolemia   . Invasive ductal carcinoma of breast, female, right ([place])   . Osteopenia 11/26/2016  . Skin-picking disorder    has 3 open wounds on right wrist that are being treated. About a quarter in size.  . Stroke [facility])     Past Surgical History:  Procedure Laterality Date  . APPENDECTOMY    . BACK SURGERY    . BREAST BIOPSY Right 10/09/2016   Procedure: BREAST BIOPSY WITH NEEDLE LOCALIZATION;  Surgeon: [name], MD;  Location: [facility] ORS;  Service: General;  Laterality: Right;  . BREAST SURGERY Right   . BUBBLE STUDY  03/23/2019   Procedure: BUBBLE STUDY;  Surgeon: [name], MD;  Location: [facility] ENDOSCOPY;  Service: Cardiovascular;;  . BUNIONECTOMY Bilateral   . FACIAL COSMETIC SURGERY     drooping eyelid and brow  . HERNIA REPAIR Right    Inguinal x2  . implantable loop recorder placement  10/21/2019    Medtronic Reveal [place] model LNQ 11 [id number]) implanted by Dr [name] for cryptogenic stroke indication  . INGUINAL LYMPH NODE BIOPSY Left 03/10/2020   Procedure: LEFT INGUINAL LYMPH NODE BIOPSY;  Surgeon: [name], MD;  Location: [facility] ORS;  Service: General;  Laterality: Left;  . KNEE ARTHROSCOPY Right   . SHOULDER SURGERY Right  rotator cuff repair and removal of bone spur  . TEE WITHOUT CARDIOVERSION N/A 03/23/2019   Procedure: TRANSESOPHAGEAL ECHOCARDIOGRAM (TEE);  Surgeon: [name], MD;  Location: [facility] ENDOSCOPY;  Service: Cardiovascular;  Laterality: N/A;  . TEMPOROMANDIBULAR JOINT SURGERY Right   . TOTAL KNEE ARTHROPLASTY Right 05/05/2018   Procedure: TOTAL KNEE ARTHROPLASTY;  Surgeon: [name], MD;  Location: [facility] ORS;  Service: Orthopedics;  Laterality: Right;     reports that she quit  smoking about 24 years ago. Her smoking use included cigarettes. She has a 20.00 pack-year smoking history. She has never used smokeless tobacco. She reports that she does not drink alcohol and does not use drugs.  Allergies  Allergen Reactions  . Cymbalta [Duloxetine Hcl] Other (See Comments)    Hallucinated and couldn't think; got worse and worse the longer she took this.    Family History  Problem Relation Age of Onset  . Anxiety disorder Mother   . Depression Mother   . Depression Maternal Grandmother   . Alcohol abuse Brother   . Cancer - Other Sister   . ADD / ADHD Neg Hx   . Bipolar disorder Neg Hx   . Dementia Neg Hx   . Drug abuse Neg Hx   . OCD Neg Hx   . Paranoid behavior Neg Hx   . Schizophrenia Neg Hx   . Seizures Neg Hx   . Sexual abuse Neg Hx   . Physical abuse Neg Hx     Prior to Admission medications   Medication Sig Start Date End Date Taking? Authorizing Provider  ALPRAZolam Duanne Moron) 0.5 MG tablet Take 1 tablet (0.5 mg total) by mouth at bedtime. 07/10/20  Yes Cloria Spring, MD  aspirin EC 81 MG tablet Take 81 mg by mouth every morning.   Yes [provider]  calcium carbonate (OSCAL) 1500 (600 Ca) MG TABS tablet Take 600 mg of elemental calcium by mouth daily with supper.    Yes [provider]  cholecalciferol (VITAMIN D) 1000 units tablet Take 1 tablet (1,000 Units total) by mouth daily. 10/23/16  Yes Baird Cancer, PA-C  cyanocobalamin (,VITAMIN B-12,) 1000 MCG/ML injection Daily for 4 days; then weekly for 1 month and then monthly after that. 08/19/19  Yes Barton Dubois, MD  donepezil (ARICEPT) 5 MG tablet Take 5 mg by mouth daily. 01/18/20  Yes [provider]  escitalopram (LEXAPRO) 20 MG tablet Take 1 tablet (20 mg total) by mouth daily. 07/10/20  Yes Cloria Spring, MD  gabapentin (NEURONTIN) 300 MG capsule Take 1 capsule (300 mg total) by mouth at bedtime. 07/10/20  Yes Cloria Spring, MD  ibuprofen (ADVIL) 800 MG  tablet Take 800 mg by mouth 3 (three) times daily as needed. 05/29/20  Yes [provider]  letrozole ([place]) 2.5 MG tablet TAKE 1 TABLET(2.5 MG) BY MOUTH DAILY 07/04/20  Yes Nicholas Lose, MD  losartan (COZAAR) 25 MG tablet TAKE 1/2 TABLET(12.5 MG) BY MOUTH AT BEDTIME Patient taking differently: Take 25 mg by mouth at bedtime. 05/09/20  Yes Allred, Jeneen Rinks, MD  metFORMIN (GLUCOPHAGE) 500 MG tablet Take 1 tablet (500 mg total) by mouth 2 (two) times daily with a meal. 04/09/19  Yes Gerlene Fee, NP  metoprolol succinate (TOPROL-XL) 25 MG 24 hr tablet Take 1 tablet (25 mg total) by mouth daily. Please keep upcoming appt in January 2022 before anymore refills. Thank you 06/29/20  Yes Fay Records, MD  oxybutynin (DITROPAN) 5 MG tablet  Take 5 mg by mouth at bedtime. 11/30/19  Yes [provider]  risperiDONE (RISPERDAL) 1 MG tablet TAKE 1 TABLET(1 MG) BY MOUTH AT BEDTIME 07/10/20  Yes Cloria Spring, MD  traZODone (DESYREL) 50 MG tablet Take 1 tablet (50 mg total) by mouth at bedtime. 07/10/20  Yes Cloria Spring, MD  venlafaxine XR (EFFEXOR-XR) 150 MG 24 hr capsule TAKE 1 CAPSULE(150 MG) BY MOUTH TWICE DAILY 07/10/20  Yes Cloria Spring, MD  atorvastatin (LIPITOR) 40 MG tablet Take 1 tablet (40 mg total) by mouth daily at 6 PM. Patient not taking: No sig reported 09/06/19   Frann Rider, NP  [facility] ULTRA test strip daily. 03/09/20   [provider]    Physical Exam: Vitals:   07/10/20 1700 07/10/20 1730 07/10/20 1800 07/10/20 1921  BP: (!) 144/76 (!) 144/95 (!) 150/86 (!) 168/85  Pulse: 77 76 78 67  Resp: 16 16 15 17   Temp:      TempSrc:      SpO2: 98% 98% 98% 95%  Weight:      Height:        Constitutional: NAD, calm, comfortable Vitals:   07/10/20 1700 07/10/20 1730 07/10/20 1800 07/10/20 1921  BP: (!) 144/76 (!) 144/95 (!) 150/86 (!) 168/85  Pulse: 77 76 78 67  Resp: 16 16 15 17   Temp:      TempSrc:      SpO2: 98% 98% 98% 95%  Weight:      Height:        Eyes: PERRL, lids and conjunctivae normal ENMT: Mucous membranes are moist.  Neck: normal, supple, no masses, no thyromegaly Respiratory: clear to auscultation bilaterally, no wheezing, no crackles. Normal respiratory effort. No accessory muscle use.  Cardiovascular: Regular rate and rhythm, Left LE- trace pitting extremity edema . 2+ pedal pulses.   Abdomen: no tenderness, no masses palpated. No hepatosplenomegaly. Bowel sounds positive.  Musculoskeletal: no clubbing / cyanosis. No joint deformity upper and lower extremities. Good ROM, no contractures. Normal muscle tone.  Skin: Left groin swelling, ~ 8-10 cm across, not very apparent unless palpated, firm, not mobile, not tender, no erythema or differential warmth, 5cm surgical incision just superior to swelling no rashes, lesions, ulcers. No induration Neurologic: Progressive difficulty finding words with prolonged sitting sentences, otherwise speech is coherent, not slurred, otherwise no apparent cranial nerve abnormality, no apparent facial asymmetry, 5/5 strength in all extremities, sensation intact. Psychiatric: Normal judgment and insight. Alert and oriented x 3-person, place and situation.  Labs on Admission: I have personally reviewed following labs and imaging studies  CBC: Recent Labs  Lab 07/10/20 1557  WBC 8.5  NEUTROABS 6.4  HGB 12.5  HCT 37.9  MCV 90.7  PLT 300   Basic Metabolic Panel: Recent Labs  Lab 07/10/20 1557  NA 135  K 3.7  CL 97*  CO2 27  GLUCOSE 96  BUN 19  CREATININE 0.80  CALCIUM 9.8   Liver Function Tests: Recent Labs  Lab 07/10/20 1557  AST 20  ALT 12  ALKPHOS 47  BILITOT 0.8  PROT 6.6  ALBUMIN 4.1   Urine analysis:    Component Value Date/Time   COLORURINE YELLOW 12/11/2019 1818   APPEARANCEUR CLEAR 12/11/2019 1818   LABSPEC 1.015 12/11/2019 1818   PHURINE 6.0 12/11/2019 [place] 12/11/2019 [place] 12/11/2019 1818   BILIRUBINUR negative  06/28/2020 1108   KETONESUR negative 06/28/2020 1108   KETONESUR 5 (A) 12/11/2019 1818  PROTEINUR negative 06/28/2020 1108   PROTEINUR NEGATIVE 12/11/2019 1818   UROBILINOGEN 1.0 06/28/2020 1108   NITRITE Negative 06/28/2020 1108   NITRITE NEGATIVE 12/11/2019 1818   LEUKOCYTESUR Moderate (2+) (A) 06/28/2020 1108   LEUKOCYTESUR NEGATIVE 12/11/2019 1818    Radiological Exams on Admission: DG Chest 1 View  Result Date: 07/10/2020 CLINICAL DATA:  84 year old female with altered mental status. EXAM: CHEST  1 VIEW COMPARISON:  Chest radiograph dated 12/11/2019. FINDINGS: Minimal left lung base atelectasis. No focal consolidation, pleural effusion, pneumothorax. Stable cardiac silhouette. Atherosclerotic calcification of the aorta. Loop recorder device. No acute osseous pathology. Partially visualized thoracolumbar fusion hardware. IMPRESSION: No active cardiopulmonary disease. Electronically Signed   By: Anner Crete M.D.   On: 07/10/2020 17:05   CT Head Wo Contrast  Result Date: 07/10/2020 CLINICAL DATA:  Hallucinations, confusion. EXAM: CT HEAD WITHOUT CONTRAST TECHNIQUE: Contiguous axial images were obtained from the base of the skull through the vertex without intravenous contrast. COMPARISON:  Dec 11, 2019. FINDINGS: Brain: Mild chronic ischemic white matter disease is noted. No mass effect or midline shift is noted. Ventricular size is within normal limits. There is no evidence of mass lesion, hemorrhage or acute infarction. Vascular: No hyperdense vessel or unexpected calcification. Skull: Normal. Negative for fracture or focal lesion. Sinuses/Orbits: No acute finding. Other: None. IMPRESSION: Mild chronic ischemic white matter disease. No acute intracranial abnormality seen. Electronically Signed   By: Marijo Conception M.D.   On: 07/10/2020 17:57   US Venous Img Lower  Left (DVT Study)  Result Date: 07/10/2020 CLINICAL DATA:  Pain and swelling LEFT leg, altered mental status EXAM:  LEFT LOWER EXTREMITY VENOUS DOPPLER ULTRASOUND TECHNIQUE: Gray-scale sonography with compression, as well as color and duplex ultrasound, were performed to evaluate the deep venous system(s) from the level of the common femoral vein through the popliteal and proximal calf veins. COMPARISON:  06/04/2019 Correlation: PET-CT 02/14/2020 FINDINGS: VENOUS Normal compressibility of the common femoral, superficial femoral, and popliteal veins, as well as the visualized calf veins. Visualized portions of profunda femoral vein and great saphenous vein unremarkable. No filling defects to suggest DVT on grayscale or color Doppler imaging. Doppler waveforms show normal direction of venous flow, normal respiratory plasticity and response to augmentation. Limited views of the contralateral common femoral vein are unremarkable. OTHER Large multiloculated cystic mass in the LEFT inguinal region 10.8 x 4.0 x 5.7 cm in size; patient had multiple enlarged hypermetabolic LEFT inguinal lymph nodes at this site on prior PET-CT exam, chart indicating prior LEFT inguinal lymph node excisions on 03/10/2020 consistent with a postoperative collection. Limitations: None IMPRESSION: No evidence of deep venous thrombosis in the LEFT lower extremity. Large multiloculated fluid collection in the LEFT inguinal region 10.8 x 4.0 x 5.7 cm most likely representing a postoperative collection; this could represent lymphocele, seroma, or old hematoma. Electronically Signed   By: Lavonia Dana M.D.   On: 07/10/2020 17:45    EKG: Independently reviewed.  Sinus rhythm, QTC prolonged 504 (EKG device reading rhythm as atrial fibrillation, but P waves are present, rhythm is mostly regular except for PAC, Sinus arrhythmia.  Assessment/Plan Principal Problem:   Difficulty with speech Active Problems:   Acute metabolic encephalopathy   Invasive ductal carcinoma of breast, female, right ([facility])   Type 2 diabetes mellitus with neurological complications  ([facility])   Chronic systolic CHF (congestive heart failure) (HCC)   Cognitive impairment   Cryptogenic stroke ([facility])   Speech difficulty, metabolic encephalopathy-with history of cryptogenic stroke  and cognitive impairment.  Also with paranoia and hallucinations.  No other focal neurologic deficits.  Head CT without acute abnormality.  UA today positive nitrates, but urinary symptoms have resolved, patient appropriately treated with keflex.  Port chest x-ray unremarkable. - EDP talked with neurologist on-call Dr. Malen Gauze, recommended MRI brain with and without contrast. -Bedside swallow evaluation -Further stroke work-up pending MRI findings - lipid panel, hemoglobin A1c in a.m. -Allow for permissive hypertension hold antihypertensives -Resume aspirin, statins -On quite a few psychoactive agents, held for now.  Right groin swelling-  Large multiloculated fluid collection in the LEFT inguinal region 10.8 x 4.0 x 5.7 cm most likely representing a postoperative collection; this could represent lymphocele, seroma, or old hematoma. - Gen surg consult.  Controlled diabetes mellitus-random glucose 96. - SSI- S -Hold Metformin  Hypertension-elevated. -Hold losartan, metoprolol for now  Systolic CHF-stable and compensated, last echo 09/2019 EF 35 to 40% with grade 1 diastolic dysfunction.  History of cognitive impairment,, depression, anxiety - Home meds include, quite a few psychoactive agents-  gabapentin,  risperidone, venlafaxine, lexapro, donepezil.  Hold for now with altered mental status. -Resume 0.5mg  Xanax nightly  Hx of stage 1A right breast cancer- s/p lumpectomy 2018 currently on Letrozole.  Follows with Dr. Delton Coombes.   DVT prophylaxis: Lovenox Code Status: Full code Family Communication: None at bedside Consults called: Gen Surg Admission status: Obs, tele     Bethena Roys MD [facility]  07/10/2020, 9:54 PM

## 2020-07-10 NOTE — Progress Notes (Signed)
Virtual Visit via Telephone Note  I connected with Amy [name] on 07/10/20 at 11:20 AM EST by telephone and verified that I am speaking with the correct person using two identifiers.  Location: Patient: home Provider: office   I discussed the limitations, risks, security and privacy concerns of performing an evaluation and management service by telephone and the availability of in person appointments. I also discussed with the patient that there may be a patient responsible charge related to this service. The patient expressed understanding and agreed to proceed.     I discussed the assessment and treatment plan with the patient. The patient was provided an opportunity to ask questions and all were answered. The patient agreed with the plan and demonstrated an understanding of the instructions.   The patient was advised to call back or seek an in-person evaluation if the symptoms worsen or if the condition fails to improve as anticipated.  I provided 15 minutes of non-face-to-face time during this encounter.   Levonne Spiller, MD  [facility] MD/PA/NP OP Progress Note  07/10/2020 11:51 AM Amy [name]  MRN:  [id number]  Chief Complaint:  Chief Complaint    Anxiety; Depression; Memory Loss; Follow-up     HPI: This patient is an 84 year old divorced white female who lives alone in [place].  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  She has 2 grown adopted children and 2 grandchildren.  She also has a caregiver who comes in 5 days a week.  The patient returns for follow-up after 3 months.  I spoke with her and her son.  Today she seems very confused.  She tells me that she has been outside walking around which really was not the case.  She had a urinary tract infection diagnosed on December 1.  She went through a trial of antibiotic that she finished and then saw her primary doctor last week at [facility] clinic.  Apparently the UTI has cleared up but she remains  increasingly confused.  She tells me and her son the 2 women were in her bathtub taking a bath last night.  Apparently a couple of nights ago she wandered out of her house with her dog and the neighbor saw her and returned to her.  This morning she was trying to leave the house again.  She is having a lot of difficulty with word finding today.  She is somewhat irritable and does not want to go the emergency room which I think is what needs to happen to make sure she is not having a new brain an event or recurrent UTI or dehydration etc.  The son describes his latest bout of confusion is fairly abrupt.  She does have underlying dementia.  He is going to do everything he can to try to get her to the emergency room for evaluation today. Visit Diagnosis:    ICD-10-CM   1. Major depressive disorder, recurrent episode, mild with anxious distress (HCC)  F33.0 ALPRAZolam (XANAX) 0.5 MG tablet    Past Psychiatric History: Psychiatric hospitalization in her 68s, since then outpatient treatment  Past Medical History:  Past Medical History:  Diagnosis Date  . Anxiety   . Arthritis   . Back pain   . Colon polyps   . Dementia ([place])    early signs per daughter in law  . Depression   . Diabetes mellitus, type II ([place])    diet controlled  . Hypercholesterolemia   . Invasive ductal  carcinoma of breast, female, right ([place])   . Osteopenia 11/26/2016  . Skin-picking disorder    has 3 open wounds on right wrist that are being treated. About a quarter in size.  . Stroke [facility])     Past Surgical History:  Procedure Laterality Date  . APPENDECTOMY    . BACK SURGERY    . BREAST BIOPSY Right 10/09/2016   Procedure: BREAST BIOPSY WITH NEEDLE LOCALIZATION;  Surgeon: [name], MD;  Location: [facility] ORS;  Service: General;  Laterality: Right;  . BREAST SURGERY Right   . BUBBLE STUDY  03/23/2019   Procedure: BUBBLE STUDY;  Surgeon: [name], MD;  Location: [facility] ENDOSCOPY;  Service: Cardiovascular;;  .  BUNIONECTOMY Bilateral   . FACIAL COSMETIC SURGERY     drooping eyelid and brow  . HERNIA REPAIR Right    Inguinal x2  . implantable loop recorder placement  10/21/2019    Medtronic Reveal [place] model [id number] [id number]) implanted by Dr [name] for cryptogenic stroke indication  . INGUINAL LYMPH NODE BIOPSY Left 03/10/2020   Procedure: LEFT INGUINAL LYMPH NODE BIOPSY;  Surgeon: [name], MD;  Location: [facility] ORS;  Service: General;  Laterality: Left;  . KNEE ARTHROSCOPY Right   . SHOULDER SURGERY Right    rotator cuff repair and removal of bone spur  . TEE WITHOUT CARDIOVERSION N/A 03/23/2019   Procedure: TRANSESOPHAGEAL ECHOCARDIOGRAM (TEE);  Surgeon: [name], MD;  Location: [facility] ENDOSCOPY;  Service: Cardiovascular;  Laterality: N/A;  . TEMPOROMANDIBULAR JOINT SURGERY Right   . TOTAL KNEE ARTHROPLASTY Right 05/05/2018   Procedure: TOTAL KNEE ARTHROPLASTY;  Surgeon: [name], MD;  Location: [facility] ORS;  Service: Orthopedics;  Laterality: Right;    Family Psychiatric History: see below  Family History:  Family History  Problem Relation Age of Onset  . Anxiety disorder Mother   . Depression Mother   . Depression Maternal Grandmother   . Alcohol abuse Brother   . Cancer - Other Sister   . ADD / ADHD Neg Hx   . Bipolar disorder Neg Hx   . Dementia Neg Hx   . Drug abuse Neg Hx   . OCD Neg Hx   . Paranoid behavior Neg Hx   . Schizophrenia Neg Hx   . Seizures Neg Hx   . Sexual abuse Neg Hx   . Physical abuse Neg Hx     Social History:  Social History   Socioeconomic History  . Marital status: Divorced    Spouse name: Not on file  . Number of children: Not on file  . Years of education: Not on file  . Highest education level: Not on file  Occupational History  . Occupation: Nurse    Comment: Retired  Tobacco Use  . Smoking status: Former Smoker    Packs/day: 0.50    Years: 40.00    Pack years: 20.00    Types: Cigarettes    Quit date: 09/04/1995    Years  since quitting: 24.8  . Smokeless tobacco: Never Used  Vaping Use  . Vaping Use: Never used  Substance and Sexual Activity  . Alcohol use: No  . Drug use: No    Types: Benzodiazepines, Hydrocodone  . Sexual activity: Never  Other Topics Concern  . Not on file  Social History Narrative   Divorced.  Former smoker, quit in 1997.  Nondrinker.    Social Determinants of Health   Financial Resource Strain: Not on file  Food Insecurity: Not on file  Transportation Needs: Not on file  Physical Activity: Not on file  Stress: Not on file  Social Connections: Not on file    Allergies:  Allergies  Allergen Reactions  . Cymbalta [Duloxetine Hcl] Other (See Comments)    Hallucinated and couldn't think; got worse and worse the longer she took this.    Metabolic Disorder Labs: Lab Results  Component Value Date   HGBA1C 6.1 (H) 03/08/2020   MPG 128.37 03/08/2020   MPG 119.76 10/05/2019   No results found for: PROLACTIN Lab Results  Component Value Date   CHOL 172 05/06/2019   TRIG 203 (H) 05/06/2019   HDL 83 05/06/2019   CHOLHDL 2.1 05/06/2019   VLDL 35 03/21/2019   LDLCALC 57 05/06/2019   LDLCALC 153 (H) 03/21/2019   Lab Results  Component Value Date   TSH 1.238 10/07/2019   TSH 1.125 10/05/2019    Therapeutic Level Labs: No results found for: LITHIUM No results found for: VALPROATE No components found for:  CBMZ  Current Medications: Current Outpatient Medications  Medication Sig Dispense Refill  . ALPRAZolam (XANAX) 0.5 MG tablet Take 1 tablet (0.5 mg total) by mouth at bedtime. 30 tablet 2  . aspirin EC 81 MG tablet Take 81 mg by mouth every morning.    Marland Kitchen atorvastatin (LIPITOR) 40 MG tablet Take 1 tablet (40 mg total) by mouth daily at 6 PM. 90 tablet 2  . calcium carbonate (OSCAL) 1500 (600 Ca) MG TABS tablet Take 600 mg of elemental calcium by mouth daily with supper.     . cholecalciferol (VITAMIN D) 1000 units tablet Take 1 tablet (1,000 Units total) by mouth  daily. 30 tablet 11  . cyanocobalamin (,VITAMIN B-12,) 1000 MCG/ML injection Daily for 4 days; then weekly for 1 month and then monthly after that. 12 mL 0  . donepezil (ARICEPT) 5 MG tablet Take 5 mg by mouth daily.    Marland Kitchen escitalopram (LEXAPRO) 20 MG tablet Take 1 tablet (20 mg total) by mouth daily. 30 tablet 2  . gabapentin (NEURONTIN) 300 MG capsule Take 1 capsule (300 mg total) by mouth at bedtime. 30 capsule 2  . letrozole (FEMARA) 2.5 MG tablet TAKE 1 TABLET(2.5 MG) BY MOUTH DAILY 90 tablet 3  . losartan (COZAAR) 25 MG tablet TAKE 1/2 TABLET(12.5 MG) BY MOUTH AT BEDTIME 45 tablet 1  . metFORMIN (GLUCOPHAGE) 500 MG tablet Take 1 tablet (500 mg total) by mouth 2 (two) times daily with a meal. 60 tablet 0  . metoprolol succinate (TOPROL-XL) 25 MG 24 hr tablet Take 1 tablet (25 mg total) by mouth daily. Please keep upcoming appt in January 2022 before anymore refills. Thank you 30 tablet 1  . ONETOUCH ULTRA test strip daily.    Marland Kitchen oxybutynin (DITROPAN) 5 MG tablet Take 5 mg by mouth at bedtime.    . risperiDONE (RISPERDAL) 1 MG tablet TAKE 1 TABLET(1 MG) BY MOUTH AT BEDTIME 30 tablet 2  . traZODone (DESYREL) 50 MG tablet Take 1 tablet (50 mg total) by mouth at bedtime. 30 tablet 2  . venlafaxine XR (EFFEXOR-XR) 150 MG 24 hr capsule TAKE 1 CAPSULE(150 MG) BY MOUTH TWICE DAILY 60 capsule 2   No current facility-administered medications for this visit.     Musculoskeletal: Strength & Muscle Tone: decreased Gait & Station: unsteady Patient leans: N/A  Psychiatric Specialty Exam: Review of Systems  Musculoskeletal: Positive for arthralgias and back pain.  Psychiatric/Behavioral: Positive for confusion. The patient is nervous/anxious.   All other systems  reviewed and are negative.   There were no vitals taken for this visit.There is no height or weight on file to calculate BMI.  General Appearance: NA  Eye Contact:  NA  Speech:  Clear and Coherent  Volume:  Decreased  Mood:  Anxious and  Irritable  Affect:  NA  Thought Process:  Disorganized and Descriptions of Associations: Loose  Orientation:  Other:  person, place  Thought Content: Illogical and Hallucinations: Visual   Suicidal Thoughts:  No  Homicidal Thoughts:  No  Memory:  Immediate;   Poor Recent;   Poor Remote;   Poor  Judgement:  Impaired  Insight:  Lacking  Psychomotor Activity:  Decreased  Concentration:  Concentration: Fair and Attention Span: Fair  Recall:  Poor  Fund of Knowledge: Fair  Language: Good  Akathisia:  No  Handed:  Right  AIMS (if indicated): not done  Assets:  Communication Skills Resilience Social Support  ADL's:  Intact  Cognition: Impaired,  Mild  Sleep:  Fair   Screenings: MDI   Flowsheet Row Office Visit from 02/15/2016 in [facility]  Total Score (max 50) 17    Mini-Mental   Flowsheet Row Office Visit from 11/22/2019 in [facility] Visit from 09/06/2019 in [facility]  Total Score (max 30 points ) 21 (P)  22       Assessment and Plan: This patient is an 84 year old female with a history of depression anxiety and dementia.  She has had vascular issues numerous strokes.  She seems to be declining in terms of her mental status and needs immediate evaluation my opinion.  Her son agrees and he is going to take her to the emergency room today.  For now she will continue all her current medications which include Lexapro 20 mg daily for depression, Effexor XR 150 mg twice daily for depression, trazodone 50 mg at bedtime for sleep, Risperdal 1 mg at bedtime for paranoid ideation and Xanax 0.5 mg daily at bedtime as needed for anxiety.  She will return to see me in 4 weeks and I will follow her ED progress   Levonne Spiller, MD 07/10/2020, 11:51 AM

## 2020-07-11 ENCOUNTER — Other Ambulatory Visit (HOSPITAL_COMMUNITY): Payer: Medicare HMO

## 2020-07-11 ENCOUNTER — Ambulatory Visit (HOSPITAL_COMMUNITY): Payer: Medicare HMO | Admitting: Oncology

## 2020-07-11 ENCOUNTER — Observation Stay (HOSPITAL_COMMUNITY): Payer: Medicare HMO

## 2020-07-11 ENCOUNTER — Other Ambulatory Visit: Payer: Self-pay

## 2020-07-11 ENCOUNTER — Ambulatory Visit (HOSPITAL_COMMUNITY): Payer: Medicare HMO

## 2020-07-11 ENCOUNTER — Ambulatory Visit (HOSPITAL_COMMUNITY): Payer: Medicare HMO | Admitting: Hematology

## 2020-07-11 ENCOUNTER — Encounter (HOSPITAL_COMMUNITY): Payer: Self-pay | Admitting: Internal Medicine

## 2020-07-11 DIAGNOSIS — L7634 Postprocedural seroma of skin and subcutaneous tissue following other procedure: Secondary | ICD-10-CM

## 2020-07-11 DIAGNOSIS — R479 Unspecified speech disturbances: Secondary | ICD-10-CM | POA: Diagnosis not present

## 2020-07-11 LAB — LIPID PANEL
Cholesterol: 208 mg/dL — ABNORMAL HIGH (ref 0–200)
HDL: 74 mg/dL (ref >40)
LDL Cholesterol: 110 mg/dL — ABNORMAL HIGH (ref 0–99)
Total CHOL/HDL Ratio: 2.8 RATIO
Triglycerides: 120 mg/dL (ref <150)
VLDL: 24 mg/dL (ref 0–40)

## 2020-07-11 LAB — HEMOGLOBIN A1C
Hgb A1c MFr Bld: 6.1 % — ABNORMAL HIGH (ref 4.8–5.6)
Mean Plasma Glucose: 128.37 mg/dL

## 2020-07-11 LAB — GLUCOSE, CAPILLARY
Glucose-Capillary: 125 mg/dL — ABNORMAL HIGH (ref 70–99)
Glucose-Capillary: 135 mg/dL — ABNORMAL HIGH (ref 70–99)
Glucose-Capillary: 160 mg/dL — ABNORMAL HIGH (ref 70–99)

## 2020-07-11 LAB — CBG MONITORING, ED: Glucose-Capillary: 128 mg/dL — ABNORMAL HIGH (ref 70–99)

## 2020-07-11 LAB — VITAMIN B12: Vitamin B-12: 1435 pg/mL — ABNORMAL HIGH (ref 180–914)

## 2020-07-11 MED ORDER — ESCITALOPRAM OXALATE 10 MG PO TABS
20.0000 mg | ORAL_TABLET | Freq: Every day | ORAL | Status: DC
Start: 2020-07-11 — End: 2020-07-11

## 2020-07-11 MED ORDER — VENLAFAXINE HCL ER 75 MG PO CP24
150.0000 mg | ORAL_CAPSULE | Freq: Every day | ORAL | Status: DC
  Administered 2020-07-12 – 2020-07-14 (×3): 150 mg via ORAL
  Filled 2020-07-11 (×3): qty 2

## 2020-07-11 MED ORDER — DONEPEZIL HCL 5 MG PO TABS
5.0000 mg | ORAL_TABLET | Freq: Every day | ORAL | Status: DC
  Administered 2020-07-11 – 2020-07-14 (×4): 5 mg via ORAL
  Filled 2020-07-11 (×4): qty 1

## 2020-07-11 MED ORDER — TUBERCULIN PPD 5 UNIT/0.1ML ID SOLN
5.0000 [IU] | Freq: Once | INTRADERMAL | Status: AC
  Administered 2020-07-11: 5 [IU] via INTRADERMAL
  Filled 2020-07-11: qty 0.1

## 2020-07-11 MED ORDER — ALPRAZOLAM 0.25 MG PO TABS
0.2500 mg | ORAL_TABLET | Freq: Three times a day (TID) | ORAL | Status: DC | PRN
  Administered 2020-07-11 – 2020-07-12 (×2): 0.25 mg via ORAL
  Filled 2020-07-11: qty 1

## 2020-07-11 MED ORDER — RISPERIDONE 0.5 MG PO TABS
0.5000 mg | ORAL_TABLET | Freq: Every day | ORAL | Status: DC
  Administered 2020-07-11 – 2020-07-13 (×3): 0.5 mg via ORAL
  Filled 2020-07-11 (×3): qty 1

## 2020-07-11 MED ORDER — LIDOCAINE HCL (PF) 1 % IJ SOLN
30.0000 mL | Freq: Once | INTRAMUSCULAR | Status: AC
  Administered 2020-07-11: 30 mL via INTRADERMAL
  Filled 2020-07-11: qty 30

## 2020-07-11 NOTE — Progress Notes (Signed)
Amy [name] admitted to room 316. Dr. Sloan Leiter at bedside and discussed plan of care with RN, patient, and son. Fall risk precautions in place. Patient expresses no needs at this time.

## 2020-07-11 NOTE — TOC Initial Note (Signed)
Transition of Care [facility]) - Initial/Assessment Note    Patient Details  Name: Amy [name] MRN: [id number] Date of Birth: 1936/06/04  Transition of Care [facility]) CM/SW Contact:    Salome Arnt, [place] Phone Number: 07/11/2020, 9:26 AM  Clinical Narrative:  Pt admitted due to speech difficulty, metabolic encephalopathy. LCSW received message from RN reporting pt's son wanted to talk to [facility] about d/c planning. Per chart, pt oriented to self only. Pt's son, Roderic Palau reports pt lives alone. 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 Her sitter assists with bathing, household tasks, meals, and taking medications. Pt ambulates with a walker. Roderic Palau is concerned about pt when she is home alone. He reports pt has fallen several times, she forgets to take her medications sometimes, she recently walked to her next door neighbor's house at night, and yesterday was standing in the door telling her sitter she was going home when she was already at home. Roderic Palau indicates pt was diagnosed with dementia almost a year ago. They have tried to convince pt she can't live at home anymore, but pt has been refusing placement. Roderic Palau states he is HCPOA. LCSW explained that as long as pt has capacity, she can make decisions. LCSW will discuss with MD regarding capacity. LCSW discussed placement at SNF vs ALF and he is aware of each level of care and understands PT will need to evaluate pt for recommendation. Pt has been to SNF 3 times after surgeries and a stroke. TOC will continue to follow and assist with d/c planning following PT evaluation.                  Expected Discharge Plan:  (TBD) Barriers to Discharge: Continued Medical Work up   Patient Goals and CMS Choice Patient states their goals for this hospitalization and ongoing recovery are:: Son feels pt will need placement.      Expected Discharge Plan and Services Expected Discharge Plan:  (TBD) In-house Referral: Clinical Social Work      Living arrangements for the past 2 months: Single Family Home                                      Prior Living Arrangements/Services Living arrangements for the past 2 months: Single Family Home Lives with:: Self Patient language and need for interpreter reviewed:: Yes Do you feel safe going back to the place where you live?: No   Son feels pt is not safe at home.  Need for Family Participation in Patient Care: Yes (Comment)   Current home services: DME,Safety alert,Sitter (walker, wheelchair, potty chair) Criminal Activity/Legal Involvement Pertinent to Current Situation/Hospitalization: No - Comment as needed  Activities of Daily Living      Permission Sought/Granted                  Emotional Assessment       Orientation: : Oriented to Self Alcohol / Substance Use: Not Applicable Psych Involvement: Outpatient Provider  Admission diagnosis:  Acute metabolic encephalopathy [H65.79] Patient Active Problem List   Diagnosis Date Noted  . Difficulty with speech 07/10/2020  . Lymphadenopathy, inguinal   . Lymphadenopathy, retroperitoneal 02/16/2020  . Cryptogenic stroke ([place]) 10/21/2019  . Palpitations 10/21/2019  . Cognitive impairment   . UTI (urinary tract infection) 10/05/2019  . Dehydration   . Major depressive disorder, recurrent episode, mild with anxious distress ([place])   .  Acute lower UTI   . Deficiency of vitamin B12   . Physical deconditioning   . Acute metabolic encephalopathy 17/40/8144  . Acute encephalopathy 08/16/2019  . Mild renal insufficiency 08/16/2019  . Chronic systolic CHF (congestive heart failure) ([place]) 08/16/2019  . Cardiac LV ejection fraction 30-35% 03/29/2019  . Dyslipidemia associated with type 2 diabetes mellitus ([place]) 03/29/2019  . Major depression with psychotic features ([place]) 03/29/2019  . History of CVA (cerebrovascular accident) 03/20/2019  . Edema of both lower legs due to peripheral venous insufficiency 10/12/2018   . Anemia 05/12/2018  . S/P total knee replacement, right 05/05/18 05/12/2018  . Type 2 diabetes mellitus with neurological complications ([place]) 81/85/6314  . Primary osteoarthritis of right knee   . Status post surgery 01/26/2018  . Surgery, elective 01/26/2018  . Degenerative spondylolisthesis 01/26/2018  . Osteoporosis 12/13/2016  . Osteopenia 11/26/2016  . Invasive ductal carcinoma of breast, female, right ([place])   . Lumbago 03/22/2014  . Stiffness of joint, not elsewhere classified, pelvic region and thigh 03/22/2014  . Muscle weakness (generalized) 03/22/2014  . Abnormality of gait 03/22/2014  . Depression with anxiety 09/03/2012  . Insomnia secondary to depression with anxiety 09/03/2012  . Pain 09/03/2012   PCP:  [name], MD Pharmacy:   [facility], [address] [place] [address] Phone: [phone] Fax: [phone]     Social Determinants of Health (SDOH) Interventions    Readmission Risk Interventions Readmission Risk Prevention Plan 08/19/2019  Transportation Screening Complete  PCP or Specialist Appt within 3-5 Days Not Complete  HRI or [place] Complete  Social Work Consult for [place] Planning/Counseling Complete  Palliative Care Screening Not Complete  Medication Review Press photographer) Complete  Some recent data might be hidden

## 2020-07-11 NOTE — Care Management Obs Status (Signed)
[facility] NOTIFICATION   Patient Details  Name: Amy [name] MRN: [id number] Date of Birth: 1936/07/16   Medicare Observation Status Notification Given:  Yes (mailed to son Patrici Ranks [address], [place], [place] [address])    Tommy Medal 07/11/2020, 3:50 PM

## 2020-07-11 NOTE — Evaluation (Signed)
Physical Therapy Evaluation Patient Details Name: Amy [name] MRN: [id number] DOB: [birth year] Today's Date: 07/11/2020   History of Present Illness  Amy [name] is a 84 y.o. female with medical history significant for systolic CHF with EF of 30 to 35%, cryptogenic stroke, diabetes mellitus, cognitive impairments.  Patient was brought to the ED with complaints of paranoia, hallucinations, confusion, difficulty speaking, wandering.  Patient had a follow-up visit with telepsych today, per notes patient was very confused, patient wandered out of her house with her dog a couple of nights ago, and was seen by neighbor and returned home.  This morning she was trying to leave the house again.  She is also having a lot of word finding difficulty.  On my evaluation patient is awake alert able to give me some history.  Initially her speech was moderately fluent when talking to me, but gradually she started having progressive difficulty getting her words out.  She tells me she has had a stroke before, but her speech is worse today.     Patient was treated for urinary tract infection 12/1 ED visit,  She had urinary symptoms, she was prescribed a course of cephalexin. Subsequent urine cultures showed sensitivity to cephalosporins resistance to ampicillin, ampicillin/sulbactam.  Today patient denies dysuria, she denies chest pain or difficulty breathing or pain anywhere.    Clinical Impression  Patient presents to PT in chair and states very ready to go home. Able to generally answer questions required for subjective, but demos increased annoyance at number of questions asked. Transfer from chair to stance with MinA, and ambulates easily, but increasingly moves posterior to the RW, and demos varied cadence during ambulation, with increasing fatigue after approx 15 ft. Able to complete stand to sit transfer safely with supervision and verbal cues. Nurse notified of seated position in chair with chair alarm on.  Patient will benefit from continued physical therapy in hospital and recommended venue below to increase strength, balance, endurance for safe ADLs and gait.    Follow Up Recommendations Home health PT;Supervision - Intermittent;Supervision for mobility/OOB    Equipment Recommendations  None recommended by PT    Recommendations for Other Services       Precautions / Restrictions Precautions Precautions: Fall Restrictions Weight Bearing Restrictions: No      Mobility  Bed Mobility                    Transfers Overall transfer level: Needs assistance Equipment used: Rolling walker (2 wheeled) Transfers: Sit to/from Stand;Stand Pivot Transfers Sit to Stand: Min assist;Min guard Stand pivot transfers: Min assist;Min guard       General transfer comment: with RW, decreased eccentric control descending  Ambulation/Gait Ambulation/Gait assistance: Min assist;Min guard Gait Distance (Feet): 35 Feet Assistive device: Rolling walker (2 wheeled) Gait Pattern/deviations: Step-through pattern;Decreased stride length;Decreased step length - right;Decreased step length - left Gait velocity: slightly below normal   General Gait Details: reliance on momentum, easily fatigued  Stairs            Wheelchair Mobility    Modified Rankin (Stroke Patients Only)       Balance Overall balance assessment: Needs assistance Sitting-balance support: Bilateral upper extremity supported Sitting balance-Leahy Scale: Fair Sitting balance - Comments: EOB,   Standing balance support: During functional activity Standing balance-Leahy Scale: Fair Standing balance comment: with RW  Pertinent Vitals/Pain Pain Assessment: No/denies pain    Home Living Family/patient expects to be discharged to:: Private residence Living Arrangements: Alone Available Help at Discharge: Family;Available PRN/intermittently;Other (Comment) (sitter daily, no  overnight support) Type of Home: House Home Access: Stairs to enter Entrance Stairs-Rails: Right (states rails, doesnt specify) Entrance Stairs-Number of Steps: 2 Home Layout: Two level;Able to live on main level with bedroom/bathroom Home Equipment: Gilford Rile - 2 wheels;Cane - single point;Bedside commode;Tub bench      Prior Function Level of Independence: Needs assistance   Gait / Transfers Assistance Needed: household ambulator with Rollator  ADL's / Homemaking Assistance Needed: assisted by family for community ADLs and some household  Comments: Actuary to help with tasks and safety     Hand Dominance   Dominant Hand: Right    Extremity/Trunk Assessment   Upper Extremity Assessment Upper Extremity Assessment: Overall WFL for tasks assessed    Lower Extremity Assessment Lower Extremity Assessment: Generalized weakness    Cervical / Trunk Assessment Cervical / Trunk Assessment: Normal  Communication   Communication: No difficulties  Cognition Arousal/Alertness: Awake/alert Behavior During Therapy: WFL for tasks assessed/performed Overall Cognitive Status: Within Functional Limits for tasks assessed                                        General Comments      Exercises     Assessment/Plan    PT Assessment Patient needs continued PT services  PT Problem List Decreased strength;Decreased mobility;Decreased safety awareness;Decreased activity tolerance;Decreased balance       PT Treatment Interventions DME instruction;Therapeutic exercise;Gait training;Balance training;Stair training;Neuromuscular re-education;Functional mobility training;Therapeutic activities;Patient/family education    PT Goals (Current goals can be found in the Care Plan section)  Acute Rehab PT Goals Patient Stated Goal: return home PT Goal Formulation: With patient Time For Goal Achievement: 07/25/20 Potential to Achieve Goals: Good    Frequency Min 3X/week    Barriers to discharge        Co-evaluation               AM-PAC PT "6 Clicks" Mobility  Outcome Measure Help needed turning from your back to your side while in a flat bed without using bedrails?: A Little Help needed moving from lying on your back to sitting on the side of a flat bed without using bedrails?: A Little Help needed moving to and from a bed to a chair (including a wheelchair)?: A Little Help needed standing up from a chair using your arms (e.g., wheelchair or bedside chair)?: A Little Help needed to walk in hospital room?: A Little Help needed climbing 3-5 steps with a railing? : A Lot 6 Click Score: 17    End of Session   Activity Tolerance: Patient tolerated treatment well;Patient limited by fatigue Patient left: in chair;with call bell/phone within reach;with chair alarm set Nurse Communication: Mobility status PT Visit Diagnosis: Unsteadiness on feet (R26.81);Other abnormalities of gait and mobility (R26.89);Repeated falls (R29.6);Muscle weakness (generalized) (M62.81)    Time: 6384-5364 PT Time Calculation (min) (ACUTE ONLY): 12 min   Charges:   PT Evaluation $PT Eval Low Complexity: 1 Low PT Treatments $Therapeutic Activity: 8-22 mins        2:58 PM,07/11/20 Domenic Moras, PT, DPT Physical Therapist at [facility]

## 2020-07-11 NOTE — Plan of Care (Signed)
  Problem: Acute Rehab PT Goals(only PT should resolve) Goal: Pt Will Go Supine/Side To Sit Outcome: Progressing Flowsheets (Taken 07/11/2020 1500) Pt will go Supine/Side to Sit: with supervision Goal: Patient Will Transfer Sit To/From Stand Outcome: Progressing Flowsheets (Taken 07/11/2020 1500) Patient will transfer sit to/from stand: with min guard assist Goal: Pt Will Transfer Bed To Chair/Chair To Bed Outcome: Progressing Flowsheets (Taken 07/11/2020 1500) Pt will Transfer Bed to Chair/Chair to Bed: min guard assist Goal: Pt Will Ambulate Outcome: Progressing Flowsheets (Taken 07/11/2020 1500) Pt will Ambulate:  50 feet  with supervision  with rolling walker  3:01 PM,07/11/20 Domenic Moras, PT, DPT Physical Therapist at [facility]

## 2020-07-11 NOTE — Progress Notes (Signed)
PROGRESS NOTE    Amy [name]  OQH:[id number] DOB: [birth year] DOA: 07/10/2020 PCP: [name], MD    Brief Narrative:  84 year old female with history of systolic congestive heart failure with known ejection fraction of 35%, cryptogenic stroke, type 2 diabetes, cognitive impairments and dementia for at least 1 year, vitamin B12 deficiency brought to the ER by her son with paranoia, hallucinations and confusion, difficulty speaking and wandering around the house.  She does live in her own house with help of home health aide partial time.  Otherwise she is by herself.  Her family is pursuing her to go to assisted living facility, however patient has not agreed to eat.  Recent visit to ER with UTI and treated completely.  In the emergency room, hemodynamically stable, CT head unremarkable.   Assessment & Plan:   Principal Problem:   Difficulty with speech Active Problems:   Invasive ductal carcinoma of breast, female, right ([place])   Type 2 diabetes mellitus with neurological complications (HCC)   Chronic systolic CHF (congestive heart failure) (HCC)   Acute metabolic encephalopathy   Cognitive impairment   Cryptogenic stroke ([place])  Dementia with behavior disturbances/speech difficulty and paranoia: No evidence of active bacterial infection.  No evidence of acute abnormalities on CT head or MRI of the brain. Probably progressive dementia with underlying multiple medical issues and previous stroke. Agree with monitoring in the hospital because of unsafe disposition. B12 is adequate more than 1400. Lipid panels and A1c are stable.  Electrolytes are normal. Patient is on multiple antipsychotic medications, will resume to regulate her behavior, regulate sleep and wake cycle. Work with PT OT.  Tuberculin test in anticipation of assisted living facility placement. Speech therapy to do cognitive test MMSE.  Right groin swelling: Seroma after lymph node excision.  She has known follicular  lymphoma that is not treated.  Followed by oncology.  Also has history of breast cancer on anastrozole.  Type 2 diabetes, controlled: On Metformin.  Currently on insulin, however she will go back on Metformin.  Hypertension: Blood pressure stable.  Resume home medications.  Chronic systolic congestive heart failure: Stable and compensated.   DVT prophylaxis: enoxaparin (LOVENOX) injection 40 mg Start: 07/10/20 2230   Code Status: Full code Family Communication: Patient's son on the phone Disposition Plan: Status is: Observation  The patient will require care spanning > 2 midnights and should be moved to inpatient because: Unsafe d/c plan  Dispo: The patient is from: Home              Anticipated d/c is to: ALF              Anticipated d/c date is: 2 days              Patient currently is not medically stable to d/c.         Consultants:   Neurology on the phone  Procedures:   None  Antimicrobials:   None   Subjective: Patient seen and examined.  Arrived on the floor.  Pleasantly confused.  Denies any complaints.  Her son was on the phone.  Patient was not sure whether she wants to go home or she wants to go to assisted living facility.  She is thinking about it.  Objective: Vitals:   07/11/20 0600 07/11/20 0715 07/11/20 0800 07/11/20 0940  BP: (!) 152/89 (!) 154/82 (!) 152/125 (!) 158/73  Pulse: 72 71 76 66  Resp: 19 15 14 17   Temp:  (!) 97.1 F (  36.2 C)  98 F (36.7 C)  TempSrc:  Oral  Oral  SpO2: 96% 95% 97% 98%  Weight:    69.3 kg  Height:        Intake/Output Summary (Last 24 hours) at 07/11/2020 1141 Last data filed at 07/11/2020 0905 Gross per 24 hour  Intake --  Output 1700 ml  Net -1700 ml   Filed Weights   07/10/20 1312 07/11/20 0940  Weight: 74.6 kg 69.3 kg    Examination:  General exam: Appears calm and comfortable  Respiratory system: Clear to auscultation. Respiratory effort normal. Cardiovascular system: S1 & S2 heard, RRR. No  JVD, murmurs, rubs, gallops or clicks. No pedal edema. Gastrointestinal system: Abdomen is nondistended, soft and nontender. No organomegaly or masses felt. Normal bowel sounds heard. Central nervous system: Alert and oriented x2.  She is oriented to place and person but not time. Extremities: Symmetric 5 x 5 power. Skin: No rashes, lesions or ulcers Psychiatry: Judgement and insight appear normal. Mood & affect flat.    Data Reviewed: I have personally reviewed following labs and imaging studies  CBC: Recent Labs  Lab 07/10/20 1557  WBC 8.5  NEUTROABS 6.4  HGB 12.5  HCT 37.9  MCV 90.7  PLT 101   Basic Metabolic Panel: Recent Labs  Lab 07/10/20 1557  NA 135  K 3.7  CL 97*  CO2 27  GLUCOSE 96  BUN 19  CREATININE 0.80  CALCIUM 9.8   GFR: Estimated Creatinine Clearance: 50.8 mL/min (by C-G formula based on SCr of 0.8 mg/dL). Liver Function Tests: Recent Labs  Lab 07/10/20 1557  AST 20  ALT 12  ALKPHOS 47  BILITOT 0.8  PROT 6.6  ALBUMIN 4.1   No results for input(s): LIPASE, AMYLASE in the last 168 hours. No results for input(s): AMMONIA in the last 168 hours. Coagulation Profile: No results for input(s): INR, PROTIME in the last 168 hours. Cardiac Enzymes: No results for input(s): CKTOTAL, CKMB, CKMBINDEX, TROPONINI in the last 168 hours. BNP (last 3 results) No results for input(s): PROBNP in the last 8760 hours. HbA1C: Recent Labs    07/10/20 1557  HGBA1C 6.1*   CBG: Recent Labs  Lab 07/10/20 2239 07/11/20 0817  GLUCAP 91 128*   Lipid Profile: Recent Labs    07/11/20 0401  CHOL 208*  HDL 74  LDLCALC 110*  TRIG 120  CHOLHDL 2.8   Thyroid Function Tests: No results for input(s): TSH, T4TOTAL, FREET4, T3FREE, THYROIDAB in the last 72 hours. Anemia Panel: Recent Labs    07/11/20 0401  VITAMINB12 1,435*   Sepsis Labs: No results for input(s): PROCALCITON, LATICACIDVEN in the last 168 hours.  Recent Results (from the past 240 hour(s))   Resp Panel by RT-PCR (Flu A&B, Covid) Nasopharyngeal Swab     Status: None   Collection Time: 07/10/20  9:35 PM   Specimen: Nasopharyngeal Swab; Nasopharyngeal(NP) swabs in vial transport medium  Result Value Ref Range Status   SARS Coronavirus 2 by RT PCR NEGATIVE NEGATIVE Final    Comment: (NOTE) SARS-CoV-2 target nucleic acids are NOT DETECTED.  The SARS-CoV-2 RNA is generally detectable in upper respiratory specimens during the acute phase of infection. The lowest concentration of SARS-CoV-2 viral copies this assay can detect is 138 copies/mL. A negative result does not preclude SARS-Cov-2 infection and should not be used as the sole basis for treatment or other patient management decisions. A negative result may occur with  improper specimen collection/handling, submission of specimen other than  nasopharyngeal swab, presence of viral mutation(s) within the areas targeted by this assay, and inadequate number of viral copies(<138 copies/mL). A negative result must be combined with clinical observations, patient history, and epidemiological information. The expected result is Negative.  Fact Sheet for Patients:  EntrepreneurPulse.com.au  Fact Sheet for Healthcare Providers:  IncredibleEmployment.be  This test is no t yet approved or cleared by the [place] FDA and  has been authorized for detection and/or diagnosis of SARS-CoV-2 by FDA under an Emergency Use Authorization (EUA). This EUA will remain  in effect (meaning this test can be used) for the duration of the COVID-19 declaration under Section 564(b)(1) of the Act, 21 U.S.C.section 360bbb-3(b)(1), unless the authorization is terminated  or revoked sooner.       Influenza A by PCR NEGATIVE NEGATIVE Final   Influenza B by PCR NEGATIVE NEGATIVE Final    Comment: (NOTE) The Xpert Xpress SARS-CoV-2/FLU/RSV plus assay is intended as an aid in the diagnosis of influenza from  Nasopharyngeal swab specimens and should not be used as a sole basis for treatment. Nasal washings and aspirates are unacceptable for Xpert Xpress SARS-CoV-2/FLU/RSV testing.  Fact Sheet for Patients: EntrepreneurPulse.com.au  Fact Sheet for Healthcare Providers: IncredibleEmployment.be  This test is not yet approved or cleared by the [place] FDA and has been authorized for detection and/or diagnosis of SARS-CoV-2 by FDA under an Emergency Use Authorization (EUA). This EUA will remain in effect (meaning this test can be used) for the duration of the COVID-19 declaration under Section 564(b)(1) of the Act, 21 U.S.C. section 360bbb-3(b)(1), unless the authorization is terminated or revoked.  Performed at [facility], [address], [place], [place] [address]          Radiology Studies: DG Chest 1 View  Result Date: 07/10/2020 CLINICAL DATA:  84 year old female with altered mental status. EXAM: CHEST  1 VIEW COMPARISON:  Chest radiograph dated 12/11/2019. FINDINGS: Minimal left lung base atelectasis. No focal consolidation, pleural effusion, pneumothorax. Stable cardiac silhouette. Atherosclerotic calcification of the aorta. Loop recorder device. No acute osseous pathology. Partially visualized thoracolumbar fusion hardware. IMPRESSION: No active cardiopulmonary disease. Electronically Signed   By: Anner Crete M.D.   On: 07/10/2020 17:05   CT Head Wo Contrast  Result Date: 07/10/2020 CLINICAL DATA:  Hallucinations, confusion. EXAM: CT HEAD WITHOUT CONTRAST TECHNIQUE: Contiguous axial images were obtained from the base of the skull through the vertex without intravenous contrast. COMPARISON:  Dec 11, 2019. FINDINGS: Brain: Mild chronic ischemic white matter disease is noted. No mass effect or midline shift is noted. Ventricular size is within normal limits. There is no evidence of mass lesion, hemorrhage or acute infarction. Vascular: No  hyperdense vessel or unexpected calcification. Skull: Normal. Negative for fracture or focal lesion. Sinuses/Orbits: No acute finding. Other: None. IMPRESSION: Mild chronic ischemic white matter disease. No acute intracranial abnormality seen. Electronically Signed   By: Marijo Conception M.D.   On: 07/10/2020 17:57   MR BRAIN WO CONTRAST  Result Date: 07/11/2020 CLINICAL DATA:  Altered mental status, aphasia EXAM: MRI HEAD WITHOUT CONTRAST TECHNIQUE: Multiplanar, multiecho pulse sequences of the brain and surrounding structures were obtained without intravenous contrast. COMPARISON:  08/17/2019 FINDINGS: Significant motion artifact is present. Several sequences are nondiagnostic. T2 FLAIR sequence was not obtained. There is no acute infarction. No mass effect or hydrocephalus. Probable chronic microvascular ischemic changes. IMPRESSION: Significantly motion degraded.  No acute infarction. Electronically Signed   By: Macy Mis M.D.   On: 07/11/2020 09:51  US Venous Img Lower  Left (DVT Study)  Result Date: 07/10/2020 CLINICAL DATA:  Pain and swelling LEFT leg, altered mental status EXAM: LEFT LOWER EXTREMITY VENOUS DOPPLER ULTRASOUND TECHNIQUE: Gray-scale sonography with compression, as well as color and duplex ultrasound, were performed to evaluate the deep venous system(s) from the level of the common femoral vein through the popliteal and proximal calf veins. COMPARISON:  06/04/2019 Correlation: PET-CT 02/14/2020 FINDINGS: VENOUS Normal compressibility of the common femoral, superficial femoral, and popliteal veins, as well as the visualized calf veins. Visualized portions of profunda femoral vein and great saphenous vein unremarkable. No filling defects to suggest DVT on grayscale or color Doppler imaging. Doppler waveforms show normal direction of venous flow, normal respiratory plasticity and response to augmentation. Limited views of the contralateral common femoral vein are unremarkable. OTHER  Large multiloculated cystic mass in the LEFT inguinal region 10.8 x 4.0 x 5.7 cm in size; patient had multiple enlarged hypermetabolic LEFT inguinal lymph nodes at this site on prior PET-CT exam, chart indicating prior LEFT inguinal lymph node excisions on 03/10/2020 consistent with a postoperative collection. Limitations: None IMPRESSION: No evidence of deep venous thrombosis in the LEFT lower extremity. Large multiloculated fluid collection in the LEFT inguinal region 10.8 x 4.0 x 5.7 cm most likely representing a postoperative collection; this could represent lymphocele, seroma, or old hematoma. Electronically Signed   By: Lavonia Dana M.D.   On: 07/10/2020 17:45        Scheduled Meds: . ALPRAZolam  0.5 mg Oral QHS  . aspirin EC  81 mg Oral q morning - 10a  . donepezil  5 mg Oral Daily  . enoxaparin (LOVENOX) injection  40 mg Subcutaneous Q24H  . escitalopram  20 mg Oral Daily  . insulin aspart  0-5 Units Subcutaneous QHS  . insulin aspart  0-9 Units Subcutaneous TID WC  . letrozole  2.5 mg Oral Daily  . risperiDONE  0.5 mg Oral QHS  . traZODone  50 mg Oral QHS  . tuberculin  5 Units Intradermal Once  . [START ON 07/12/2020] venlafaxine XR  150 mg Oral Q breakfast   Continuous Infusions:   LOS: 0 days    Time spent: 30 minutes    Barb Merino, MD [facility] Pager [phone]

## 2020-07-11 NOTE — ED Notes (Signed)
Report called to [phone] 

## 2020-07-11 NOTE — Consult Note (Signed)
[place]  Reason for Consult: Post operative fluid collection  Referring Physician:  Dr. Laurena Bering   Chief Complaint    Altered Mental Status      HPI: Amy [name] is a 84 y.o. female with lymphoma who had a lymph node biopsy from her left groin region in August with Dr. Arnoldo Morale. She comes in with altered mental status and UTI and was found wandering outside of her home. She was noted to have swelling in the left leg and groin and an [place] was done that demonstrated over a 10cm fluid pocket in the left groin but no signs of DVT. We were consulted to determine etiology and plans for fluid collection.  She is somewhat confused and says she hurts everywhere.  I spoke with her son, Amy [name] and obtained permission for aspiration for possible seroma, hematoma versus lymphocele, and discussed the risk of bleeding, infection, needing additional procedures.  No drainage or redness in the area.   Past Medical History:  Diagnosis Date  . Anxiety   . Arthritis   . Back pain   . Colon polyps   . Dementia ([place])    early signs per daughter in law  . Depression   . Diabetes mellitus, type II ([place])    diet controlled  . Hypercholesterolemia   . Invasive ductal carcinoma of breast, female, right ([place])   . Osteopenia 11/26/2016  . Skin-picking disorder    has 3 open wounds on right wrist that are being treated. About a quarter in size.  . Stroke [facility])     Past Surgical History:  Procedure Laterality Date  . APPENDECTOMY    . BACK SURGERY    . BREAST BIOPSY Right 10/09/2016   Procedure: BREAST BIOPSY WITH NEEDLE LOCALIZATION;  Surgeon: [name], MD;  Location: [facility] ORS;  Service: General;  Laterality: Right;  . BREAST SURGERY Right   . BUBBLE STUDY  03/23/2019   Procedure: BUBBLE STUDY;  Surgeon: [name], MD;  Location: [facility] ENDOSCOPY;  Service: Cardiovascular;;  . BUNIONECTOMY Bilateral   . FACIAL COSMETIC SURGERY     drooping eyelid and brow  .  HERNIA REPAIR Right    Inguinal x2  . implantable loop recorder placement  10/21/2019    Medtronic Reveal Woodruff model LNQ 11 [id number]) implanted by Dr [name] for cryptogenic stroke indication  . INGUINAL LYMPH NODE BIOPSY Left 03/10/2020   Procedure: LEFT INGUINAL LYMPH NODE BIOPSY;  Surgeon: [name], MD;  Location: [facility] ORS;  Service: General;  Laterality: Left;  . KNEE ARTHROSCOPY Right   . SHOULDER SURGERY Right    rotator cuff repair and removal of bone spur  . TEE WITHOUT CARDIOVERSION N/A 03/23/2019   Procedure: TRANSESOPHAGEAL ECHOCARDIOGRAM (TEE);  Surgeon: [name], MD;  Location: [facility] ENDOSCOPY;  Service: Cardiovascular;  Laterality: N/A;  . TEMPOROMANDIBULAR JOINT SURGERY Right   . TOTAL KNEE ARTHROPLASTY Right 05/05/2018   Procedure: TOTAL KNEE ARTHROPLASTY;  Surgeon: [name], MD;  Location: [facility] ORS;  Service: Orthopedics;  Laterality: Right;    Family History  Problem Relation Age of Onset  . Anxiety disorder Mother   . Depression Mother   . Depression Maternal Grandmother   . Alcohol abuse Brother   . Cancer - Other Sister   . ADD / ADHD Neg Hx   . Bipolar disorder Neg Hx   . Dementia Neg Hx   . Drug abuse Neg Hx   . OCD Neg Hx   . Paranoid  behavior Neg Hx   . Schizophrenia Neg Hx   . Seizures Neg Hx   . Sexual abuse Neg Hx   . Physical abuse Neg Hx     Social History   Tobacco Use  . Smoking status: Former Smoker    Packs/day: 0.50    Years: 40.00    Pack years: 20.00    Types: Cigarettes    Quit date: 09/04/1995    Years since quitting: 24.8  . Smokeless tobacco: Never Used  Vaping Use  . Vaping Use: Never used  Substance Use Topics  . Alcohol use: No  . Drug use: No    Types: Benzodiazepines, Hydrocodone    Medications:  I have reviewed the patient's current medications. Prior to Admission:  Medications Prior to Admission  Medication Sig Dispense Refill Last Dose  . ALPRAZolam (XANAX) 0.5 MG tablet Take 1 tablet (0.5 mg  total) by mouth at bedtime. 30 tablet 2 07/09/2020 at Unknown time  . aspirin EC 81 MG tablet Take 81 mg by mouth every morning.   07/10/2020 at Unknown time  . calcium carbonate (OSCAL) 1500 (600 Ca) MG TABS tablet Take 600 mg of elemental calcium by mouth daily with supper.    07/10/2020 at Unknown time  . cholecalciferol (VITAMIN D) 1000 units tablet Take 1 tablet (1,000 Units total) by mouth daily. 30 tablet 11 07/10/2020 at Unknown time  . cyanocobalamin (,VITAMIN B-12,) 1000 MCG/ML injection Daily for 4 days; then weekly for 1 month and then monthly after that. 12 mL 0 07/10/2020 at Unknown time  . donepezil (ARICEPT) 5 MG tablet Take 5 mg by mouth daily.   07/09/2020 at Unknown time  . escitalopram (LEXAPRO) 20 MG tablet Take 1 tablet (20 mg total) by mouth daily. 30 tablet 2 07/10/2020 at Unknown time  . gabapentin (NEURONTIN) 300 MG capsule Take 1 capsule (300 mg total) by mouth at bedtime. 30 capsule 2 07/09/2020 at Unknown time  . ibuprofen (ADVIL) 800 MG tablet Take 800 mg by mouth 3 (three) times daily as needed.   Past Week at Unknown time  . letrozole (FEMARA) 2.5 MG tablet TAKE 1 TABLET(2.5 MG) BY MOUTH DAILY 90 tablet 3 07/10/2020 at Unknown time  . losartan (COZAAR) 25 MG tablet TAKE 1/2 TABLET(12.5 MG) BY MOUTH AT BEDTIME (Patient taking differently: Take 25 mg by mouth at bedtime.) 45 tablet 1 07/09/2020 at Unknown time  . metFORMIN (GLUCOPHAGE) 500 MG tablet Take 1 tablet (500 mg total) by mouth 2 (two) times daily with a meal. 60 tablet 0 07/10/2020 at Unknown time  . metoprolol succinate (TOPROL-XL) 25 MG 24 hr tablet Take 1 tablet (25 mg total) by mouth daily. Please keep upcoming appt in January 2022 before anymore refills. Thank you 30 tablet 1 07/10/2020 at 0900  . oxybutynin (DITROPAN) 5 MG tablet Take 5 mg by mouth at bedtime.   07/09/2020 at Unknown time  . risperiDONE (RISPERDAL) 1 MG tablet TAKE 1 TABLET(1 MG) BY MOUTH AT BEDTIME 30 tablet 2 07/09/2020 at Unknown time   . traZODone (DESYREL) 50 MG tablet Take 1 tablet (50 mg total) by mouth at bedtime. 30 tablet 2 07/09/2020 at Unknown time  . venlafaxine XR (EFFEXOR-XR) 150 MG 24 hr capsule TAKE 1 CAPSULE(150 MG) BY MOUTH TWICE DAILY 60 capsule 2 07/10/2020 at Unknown time  . atorvastatin (LIPITOR) 40 MG tablet Take 1 tablet (40 mg total) by mouth daily at 6 PM. (Patient not taking: No sig reported) 90 tablet 2 Not Taking  at Unknown time  . ONETOUCH ULTRA test strip daily.      Scheduled: . ALPRAZolam  0.5 mg Oral QHS  . aspirin EC  81 mg Oral q morning - 10a  . donepezil  5 mg Oral Daily  . enoxaparin (LOVENOX) injection  40 mg Subcutaneous Q24H  . insulin aspart  0-5 Units Subcutaneous QHS  . insulin aspart  0-9 Units Subcutaneous TID WC  . letrozole  2.5 mg Oral Daily  . risperiDONE  0.5 mg Oral QHS  . traZODone  50 mg Oral QHS  . tuberculin  5 Units Intradermal Once  . [START ON 07/12/2020] venlafaxine XR  150 mg Oral Q breakfast   Continuous:  IHK:[id number] **OR** acetaminophen, ALPRAZolam, ondansetron **OR** ondansetron (ZOFRAN) IV  Allergies  Allergen Reactions  . Cymbalta [Duloxetine Hcl] Other (See Comments)    Hallucinated and couldn't think; got worse and worse the longer she took this.     ROS:  A comprehensive review of systems was negative except for: Constitutional: positive for malaise and confusion Genitourinary: positive for UTI Musculoskeletal: positive for left groin fluid collection and left leg swelling   Blood pressure (!) 154/82, pulse 71, temperature (!) 97.1 F (36.2 C), temperature source Oral, resp. rate 15, height 5\' 6"  (1.676 m), weight 74.6 kg, SpO2 95 %. Physical Exam Vitals reviewed.  HENT:     Head: Normocephalic.     Nose: Nose normal.  Eyes:     Pupils: Pupils are equal, round, and reactive to light.  Cardiovascular:     Rate and Rhythm: Normal rate.  Pulmonary:     Effort: Pulmonary effort is normal.  Abdominal:     General: There is no  distension.     Palpations: Abdomen is soft.     Tenderness: There is no abdominal tenderness.  Musculoskeletal:     Comments: Left groin incision with some fluctuance and minor swelling under incision, no erythema or drainage   Skin:    General: Skin is warm.  Neurological:     Mental Status: She is alert. Mental status is at baseline.     Results: Results for orders placed or performed during the hospital encounter of 07/10/20 (from the past 48 hour(s))  Comprehensive metabolic panel     Status: Abnormal   Collection Time: 07/10/20  3:57 PM  Result Value Ref Range   Sodium 135 135 - 145 mmol/L   Potassium 3.7 3.5 - 5.1 mmol/L   Chloride 97 (L) 98 - 111 mmol/L   CO2 27 22 - 32 mmol/L   Glucose, Bld 96 70 - 99 mg/dL    Comment: Glucose reference range applies only to samples taken after fasting for at least 8 hours.   BUN 19 8 - 23 mg/dL   Creatinine, Ser 0.80 0.44 - 1.00 mg/dL   Calcium 9.8 8.9 - 10.3 mg/dL   Total Protein 6.6 6.5 - 8.1 g/dL   Albumin 4.1 3.5 - 5.0 g/dL   AST 20 15 - 41 U/L   ALT 12 0 - 44 U/L   Alkaline Phosphatase 47 38 - 126 U/L   Total Bilirubin 0.8 0.3 - 1.2 mg/dL   GFR, Estimated >60 >60 mL/min    Comment: (NOTE) Calculated using the CKD-EPI Creatinine Equation (2021)    Anion gap 11 5 - 15    Comment: Performed at [facility], [address] [place], [place] [address]  CBC with Differential     Status: None   Collection Time: 07/10/20  3:57 PM  Result Value Ref Range   WBC 8.5 4.0 - 10.5 K/uL   RBC 4.18 3.87 - 5.11 MIL/uL   Hemoglobin 12.5 12.0 - 15.0 g/dL   HCT 37.9 36.0 - 46.0 %   MCV 90.7 80.0 - 100.0 fL   MCH 29.9 26.0 - 34.0 pg   MCHC 33.0 30.0 - 36.0 g/dL   RDW 12.4 11.5 - 15.5 %   Platelets 239 150 - 400 K/uL   nRBC 0.0 0.0 - 0.2 %   Neutrophils Relative % 76 %   Neutro Abs 6.4 1.7 - 7.7 K/uL   Lymphocytes Relative 15 %   Lymphs Abs 1.3 0.7 - 4.0 K/uL   Monocytes Relative 8 %   Monocytes Absolute 0.6 0.1 - 1.0 K/uL    Eosinophils Relative 1 %   Eosinophils Absolute 0.1 0.0 - 0.5 K/uL   Basophils Relative 0 %   Basophils Absolute 0.0 0.0 - 0.1 K/uL   Immature Granulocytes 0 %   Abs Immature Granulocytes 0.02 0.00 - 0.07 K/uL    Comment: Performed at [facility], [address] [place], [place] [address]  Troponin I (High Sensitivity)     Status: None   Collection Time: 07/10/20  3:57 PM  Result Value Ref Range   Troponin I (High Sensitivity) 7 <18 ng/L    Comment: (NOTE) Elevated high sensitivity troponin I (hsTnI) values and significant  changes across serial measurements may suggest ACS but many other  chronic and acute conditions are known to elevate hsTnI results.  Refer to the "Links" section for chest pain algorithms and additional  guidance. Performed at [facility], [address] [place], [place] [address]   Hemoglobin A1c     Status: Abnormal   Collection Time: 07/10/20  3:57 PM  Result Value Ref Range   Hgb A1c MFr Bld 6.1 (H) 4.8 - 5.6 %    Comment: (NOTE) Pre diabetes:          5.7%-6.4%  Diabetes:              >6.4%  Glycemic control for   <7.0% adults with diabetes    Mean Plasma Glucose 128.37 mg/dL    Comment: Performed at [facility] [address] [place], [place] [address]  Urinalysis, Routine w reflex microscopic Urine, Clean Catch     Status: Abnormal   Collection Time: 07/10/20  7:20 PM  Result Value Ref Range   Color, Urine YELLOW YELLOW   APPearance CLEAR CLEAR   Specific Gravity, Urine 1.014 1.005 - 1.030   pH 7.0 5.0 - 8.0   Glucose, UA NEGATIVE NEGATIVE mg/dL   Hgb urine dipstick NEGATIVE NEGATIVE   Bilirubin Urine NEGATIVE NEGATIVE   Ketones, ur 20 (A) NEGATIVE mg/dL   Protein, ur NEGATIVE NEGATIVE mg/dL   Nitrite POSITIVE (A) NEGATIVE   Leukocytes,Ua NEGATIVE NEGATIVE   RBC / HPF 0-5 0 - 5 RBC/hpf   WBC, UA 0-5 0 - 5 WBC/hpf   Bacteria, UA RARE (A) NONE SEEN   Mucus PRESENT     Comment: Performed at [facility], [address]  [place], [place] [address]  Troponin I (High Sensitivity)     Status: None   Collection Time: 07/10/20  8:42 PM  Result Value Ref Range   Troponin I (High Sensitivity) 9 <18 ng/L    Comment: (NOTE) Elevated high sensitivity troponin I (hsTnI) values and significant  changes across serial measurements may suggest ACS but many other  chronic and acute conditions are known to elevate hsTnI results.  Refer to the "Links" section for chest pain algorithms and additional  guidance. Performed at [facility], [address] [place], [place] [address]   Resp Panel by RT-PCR (Flu A&B, Covid) Nasopharyngeal Swab     Status: None   Collection Time: 07/10/20  9:35 PM   Specimen: Nasopharyngeal Swab; Nasopharyngeal(NP) swabs in vial transport medium  Result Value Ref Range   SARS Coronavirus 2 by RT PCR NEGATIVE NEGATIVE    Comment: (NOTE) SARS-CoV-2 target nucleic acids are NOT DETECTED.  The SARS-CoV-2 RNA is generally detectable in upper respiratory specimens during the acute phase of infection. The lowest concentration of SARS-CoV-2 viral copies this assay can detect is 138 copies/mL. A negative result does not preclude SARS-Cov-2 infection and should not be used as the sole basis for treatment or other patient management decisions. A negative result may occur with  improper specimen collection/handling, submission of specimen other than nasopharyngeal swab, presence of viral mutation(s) within the areas targeted by this assay, and inadequate number of viral copies(<138 copies/mL). A negative result must be combined with clinical observations, patient history, and epidemiological information. The expected result is Negative.  Fact Sheet for Patients:  EntrepreneurPulse.com.au  Fact Sheet for Healthcare Providers:  IncredibleEmployment.be  This test is no t yet approved or cleared by the [place] FDA and  has been authorized for detection and/or  diagnosis of SARS-CoV-2 by FDA under an Emergency Use Authorization (EUA). This EUA will remain  in effect (meaning this test can be used) for the duration of the COVID-19 declaration under Section 564(b)(1) of the Act, 21 U.S.C.section 360bbb-3(b)(1), unless the authorization is terminated  or revoked sooner.       Influenza A by PCR NEGATIVE NEGATIVE   Influenza B by PCR NEGATIVE NEGATIVE    Comment: (NOTE) The Xpert Xpress SARS-CoV-2/FLU/RSV plus assay is intended as an aid in the diagnosis of influenza from Nasopharyngeal swab specimens and should not be used as a sole basis for treatment. Nasal washings and aspirates are unacceptable for Xpert Xpress SARS-CoV-2/FLU/RSV testing.  Fact Sheet for Patients: EntrepreneurPulse.com.au  Fact Sheet for Healthcare Providers: IncredibleEmployment.be  This test is not yet approved or cleared by the [place] FDA and has been authorized for detection and/or diagnosis of SARS-CoV-2 by FDA under an Emergency Use Authorization (EUA). This EUA will remain in effect (meaning this test can be used) for the duration of the COVID-19 declaration under Section 564(b)(1) of the Act, 21 U.S.C. section 360bbb-3(b)(1), unless the authorization is terminated or revoked.  Performed at [facility], [address] [place], [place] [address]   CBG monitoring, ED     Status: None   Collection Time: 07/10/20 10:39 PM  Result Value Ref Range   Glucose-Capillary 91 70 - 99 mg/dL    Comment: Glucose reference range applies only to samples taken after fasting for at least 8 hours.  Lipid panel     Status: Abnormal   Collection Time: 07/11/20  4:01 AM  Result Value Ref Range   Cholesterol 208 (H) 0 - 200 mg/dL   Triglycerides 120 <150 mg/dL   HDL 74 >40 mg/dL   Total CHOL/HDL Ratio 2.8 RATIO   VLDL 24 0 - 40 mg/dL   LDL Cholesterol 110 (H) 0 - 99 mg/dL    Comment:        Total Cholesterol/HDL:CHD Risk Coronary  Heart Disease Risk Table  Men   Women  1/2 Average Risk   3.4   3.3  Average Risk       5.0   4.4  2 X Average Risk   9.6   7.1  3 X Average Risk  23.4   11.0        Use the calculated Patient Ratio above and the CHD Risk Table to determine the patient's CHD Risk.        ATP III CLASSIFICATION (LDL):  <100     mg/dL   Optimal  100-129  mg/dL   Near or Above                    Optimal  130-159  mg/dL   Borderline  160-189  mg/dL   High  >190     mg/dL   Very High Performed at [facility], [address] [place], [place] [address]   CBG monitoring, ED     Status: Abnormal   Collection Time: 07/11/20  8:17 AM  Result Value Ref Range   Glucose-Capillary 128 (H) 70 - 99 mg/dL    Comment: Glucose reference range applies only to samples taken after fasting for at least 8 hours.   Personally reviewed [place]- Fluid collection with one loculation superficial  DG Chest 1 View  Result Date: 07/10/2020 CLINICAL DATA:  84 year old female with altered mental status. EXAM: CHEST  1 VIEW COMPARISON:  Chest radiograph dated 12/11/2019. FINDINGS: Minimal left lung base atelectasis. No focal consolidation, pleural effusion, pneumothorax. Stable cardiac silhouette. Atherosclerotic calcification of the aorta. Loop recorder device. No acute osseous pathology. Partially visualized thoracolumbar fusion hardware. IMPRESSION: No active cardiopulmonary disease. Electronically Signed   By: Anner Crete M.D.   On: 07/10/2020 17:05   CT Head Wo Contrast  Result Date: 07/10/2020 CLINICAL DATA:  Hallucinations, confusion. EXAM: CT HEAD WITHOUT CONTRAST TECHNIQUE: Contiguous axial images were obtained from the base of the skull through the vertex without intravenous contrast. COMPARISON:  Dec 11, 2019. FINDINGS: Brain: Mild chronic ischemic white matter disease is noted. No mass effect or midline shift is noted. Ventricular size is within normal limits. There is no evidence of mass lesion,  hemorrhage or acute infarction. Vascular: No hyperdense vessel or unexpected calcification. Skull: Normal. Negative for fracture or focal lesion. Sinuses/Orbits: No acute finding. Other: None. IMPRESSION: Mild chronic ischemic white matter disease. No acute intracranial abnormality seen. Electronically Signed   By: Marijo Conception M.D.   On: 07/10/2020 17:57   US Venous Img Lower  Left (DVT Study)  Result Date: 07/10/2020 CLINICAL DATA:  Pain and swelling LEFT leg, altered mental status EXAM: LEFT LOWER EXTREMITY VENOUS DOPPLER ULTRASOUND TECHNIQUE: Gray-scale sonography with compression, as well as color and duplex ultrasound, were performed to evaluate the deep venous system(s) from the level of the common femoral vein through the popliteal and proximal calf veins. COMPARISON:  06/04/2019 Correlation: PET-CT 02/14/2020 FINDINGS: VENOUS Normal compressibility of the common femoral, superficial femoral, and popliteal veins, as well as the visualized calf veins. Visualized portions of profunda femoral vein and great saphenous vein unremarkable. No filling defects to suggest DVT on grayscale or color Doppler imaging. Doppler waveforms show normal direction of venous flow, normal respiratory plasticity and response to augmentation. Limited views of the contralateral common femoral vein are unremarkable. OTHER Large multiloculated cystic mass in the LEFT inguinal region 10.8 x 4.0 x 5.7 cm in size; patient had multiple enlarged hypermetabolic LEFT inguinal lymph nodes at this site on  prior PET-CT exam, chart indicating prior LEFT inguinal lymph node excisions on 03/10/2020 consistent with a postoperative collection. Limitations: None IMPRESSION: No evidence of deep venous thrombosis in the LEFT lower extremity. Large multiloculated fluid collection in the LEFT inguinal region 10.8 x 4.0 x 5.7 cm most likely representing a postoperative collection; this could represent lymphocele, seroma, or old hematoma.  Electronically Signed   By: Lavonia Dana M.D.   On: 07/10/2020 17:45   Preprocedure diagnosis: Post operative fluid collection Post procedure diagnosis: Post operative seroma  Procedure: Aspiration of seroma cavity   Description: A time out was performed at the bedside verifying the patient, date of birth and procedure and procedure side. The left groin was prepped with chloraprep. Lidocaine 10 cc was injected into the skin. A 18 gauge needle and syringe were used to aspirate back 30 cc of serous colored fluid with minor cloudy appearance. Sent for culture and triglycerides. The area was bandaged with gauze and a pressure dressing.   Assessment & Plan:  JOLYNDA TOWNLEY is a 84 y.o. female with a left groin seroma post operative after a lymph node biopsy. The seroma has been aspirated and does not appear infected or like a lymph leak.  Will test to be sure given the time since her procedure in August. Hopefully this is just a seroma and it will decompress and collapse with this single aspiration.   -Cultures/ fluid sent for triglycerides  -Son updated   All questions were answered to the satisfaction of the patient and family.  Discussed with Dr. Laurena Bering.    Virl Cagey 07/11/2020, 8:54 AM

## 2020-07-12 DIAGNOSIS — F0391 Unspecified dementia with behavioral disturbance: Secondary | ICD-10-CM | POA: Diagnosis present

## 2020-07-12 DIAGNOSIS — I639 Cerebral infarction, unspecified: Secondary | ICD-10-CM | POA: Diagnosis not present

## 2020-07-12 DIAGNOSIS — Z96651 Presence of right artificial knee joint: Secondary | ICD-10-CM | POA: Diagnosis present

## 2020-07-12 DIAGNOSIS — Z7984 Long term (current) use of oral hypoglycemic drugs: Secondary | ICD-10-CM | POA: Diagnosis not present

## 2020-07-12 DIAGNOSIS — E1149 Type 2 diabetes mellitus with other diabetic neurological complication: Secondary | ICD-10-CM | POA: Diagnosis present

## 2020-07-12 DIAGNOSIS — R479 Unspecified speech disturbances: Secondary | ICD-10-CM | POA: Diagnosis not present

## 2020-07-12 DIAGNOSIS — F32A Depression, unspecified: Secondary | ICD-10-CM | POA: Diagnosis present

## 2020-07-12 DIAGNOSIS — R4189 Other symptoms and signs involving cognitive functions and awareness: Secondary | ICD-10-CM | POA: Diagnosis present

## 2020-07-12 DIAGNOSIS — Z87891 Personal history of nicotine dependence: Secondary | ICD-10-CM | POA: Diagnosis not present

## 2020-07-12 DIAGNOSIS — C8290 Follicular lymphoma, unspecified, unspecified site: Secondary | ICD-10-CM | POA: Diagnosis present

## 2020-07-12 DIAGNOSIS — F419 Anxiety disorder, unspecified: Secondary | ICD-10-CM | POA: Diagnosis present

## 2020-07-12 DIAGNOSIS — Y838 Other surgical procedures as the cause of abnormal reaction of the patient, or of later complication, without mention of misadventure at the time of the procedure: Secondary | ICD-10-CM | POA: Diagnosis present

## 2020-07-12 DIAGNOSIS — I5022 Chronic systolic (congestive) heart failure: Secondary | ICD-10-CM | POA: Diagnosis present

## 2020-07-12 DIAGNOSIS — Z811 Family history of alcohol abuse and dependence: Secondary | ICD-10-CM | POA: Diagnosis not present

## 2020-07-12 DIAGNOSIS — Z7982 Long term (current) use of aspirin: Secondary | ICD-10-CM | POA: Diagnosis not present

## 2020-07-12 DIAGNOSIS — R410 Disorientation, unspecified: Secondary | ICD-10-CM | POA: Diagnosis present

## 2020-07-12 DIAGNOSIS — Z79811 Long term (current) use of aromatase inhibitors: Secondary | ICD-10-CM | POA: Diagnosis not present

## 2020-07-12 DIAGNOSIS — I110 Hypertensive heart disease with heart failure: Secondary | ICD-10-CM | POA: Diagnosis present

## 2020-07-12 DIAGNOSIS — C50911 Malignant neoplasm of unspecified site of right female breast: Secondary | ICD-10-CM | POA: Diagnosis present

## 2020-07-12 DIAGNOSIS — Z818 Family history of other mental and behavioral disorders: Secondary | ICD-10-CM | POA: Diagnosis not present

## 2020-07-12 DIAGNOSIS — R4182 Altered mental status, unspecified: Secondary | ICD-10-CM | POA: Diagnosis present

## 2020-07-12 DIAGNOSIS — E7800 Pure hypercholesterolemia, unspecified: Secondary | ICD-10-CM | POA: Diagnosis present

## 2020-07-12 DIAGNOSIS — G8929 Other chronic pain: Secondary | ICD-10-CM | POA: Diagnosis present

## 2020-07-12 DIAGNOSIS — Y92009 Unspecified place in unspecified non-institutional (private) residence as the place of occurrence of the external cause: Secondary | ICD-10-CM | POA: Diagnosis not present

## 2020-07-12 DIAGNOSIS — L7634 Postprocedural seroma of skin and subcutaneous tissue following other procedure: Secondary | ICD-10-CM | POA: Diagnosis present

## 2020-07-12 DIAGNOSIS — Z20822 Contact with and (suspected) exposure to covid-19: Secondary | ICD-10-CM | POA: Diagnosis present

## 2020-07-12 DIAGNOSIS — T50915A Adverse effect of multiple unspecified drugs, medicaments and biological substances, initial encounter: Secondary | ICD-10-CM | POA: Diagnosis present

## 2020-07-12 DIAGNOSIS — Z79899 Other long term (current) drug therapy: Secondary | ICD-10-CM | POA: Diagnosis not present

## 2020-07-12 DIAGNOSIS — M549 Dorsalgia, unspecified: Secondary | ICD-10-CM | POA: Diagnosis present

## 2020-07-12 DIAGNOSIS — G928 Other toxic encephalopathy: Secondary | ICD-10-CM | POA: Diagnosis present

## 2020-07-12 LAB — TRIGLYCERIDES, BODY FLUIDS: Triglycerides, Fluid: 12 mg/dL

## 2020-07-12 LAB — GLUCOSE, CAPILLARY
Glucose-Capillary: 151 mg/dL — ABNORMAL HIGH (ref 70–99)
Glucose-Capillary: 219 mg/dL — ABNORMAL HIGH (ref 70–99)
Glucose-Capillary: 236 mg/dL — ABNORMAL HIGH (ref 70–99)
Glucose-Capillary: 118 mg/dL — ABNORMAL HIGH (ref 70–99)

## 2020-07-12 NOTE — Evaluation (Signed)
Speech Language Pathology Evaluation Patient Details Name: Amy [name] MRN: [id number] DOB: 02/09/1936 Today's Date: 07/12/2020 Time: 1740-8144 SLP Time Calculation (min) (ACUTE ONLY): 29.43 min  Problem List:  Patient Active Problem List   Diagnosis Date Noted  . Confusion 07/12/2020  . Postprocedural seroma of skin and subcutaneous tissue following other procedure   . Difficulty with speech 07/10/2020  . Lymphadenopathy, inguinal   . Lymphadenopathy, retroperitoneal 02/16/2020  . Cryptogenic stroke ([place]) 10/21/2019  . Palpitations 10/21/2019  . Cognitive impairment   . UTI (urinary tract infection) 10/05/2019  . Dehydration   . Major depressive disorder, recurrent episode, mild with anxious distress ([place])   . Acute lower UTI   . Deficiency of vitamin B12   . Physical deconditioning   . Acute metabolic encephalopathy 81/85/6314  . Acute encephalopathy 08/16/2019  . Mild renal insufficiency 08/16/2019  . Chronic systolic CHF (congestive heart failure) ([place]) 08/16/2019  . Cardiac LV ejection fraction 30-35% 03/29/2019  . Dyslipidemia associated with type 2 diabetes mellitus ([place]) 03/29/2019  . Major depression with psychotic features ([place]) 03/29/2019  . History of CVA (cerebrovascular accident) 03/20/2019  . Edema of both lower legs due to peripheral venous insufficiency 10/12/2018  . Anemia 05/12/2018  . S/P total knee replacement, right 05/05/18 05/12/2018  . Type 2 diabetes mellitus with neurological complications ([place]) 97/08/6376  . Primary osteoarthritis of right knee   . Status post surgery 01/26/2018  . Surgery, elective 01/26/2018  . Degenerative spondylolisthesis 01/26/2018  . Osteoporosis 12/13/2016  . Osteopenia 11/26/2016  . Invasive ductal carcinoma of breast, female, right ([place])   . Lumbago 03/22/2014  . Stiffness of joint, not elsewhere classified, pelvic region and thigh 03/22/2014  . Muscle weakness (generalized) 03/22/2014  . Abnormality of gait  03/22/2014  . Depression with anxiety 09/03/2012  . Insomnia secondary to depression with anxiety 09/03/2012  . Pain 09/03/2012   Past Medical History:  Past Medical History:  Diagnosis Date  . Anxiety   . Arthritis   . Back pain   . Colon polyps   . Dementia ([place])    early signs per daughter in law  . Depression   . Diabetes mellitus, type II ([place])    diet controlled  . Hypercholesterolemia   . Invasive ductal carcinoma of breast, female, right ([place])   . Osteopenia 11/26/2016  . Skin-picking disorder    has 3 open wounds on right wrist that are being treated. About a quarter in size.  . Stroke [facility])    Past Surgical History:  Past Surgical History:  Procedure Laterality Date  . APPENDECTOMY    . BACK SURGERY    . BREAST BIOPSY Right 10/09/2016   Procedure: BREAST BIOPSY WITH NEEDLE LOCALIZATION;  Surgeon: [name], MD;  Location: [facility]S;  Service: General;  Laterality: Right;  . BREAST SURGERY Right   . BUBBLE STUDY  03/23/2019   Procedure: BUBBLE STUDY;  Surgeon: [name], MD;  Location: [facility] END[facility]COPY;  Service: Cardiovascular;;  . BUNIONECTOMY Bilateral   . FACIAL COSMETIC SURGERY     drooping eyelid and brow  . HERNIA REPAIR Right    Inguinal x2  . implantable loop recorder placement  10/21/2019    Medtronic Reveal [place] model LNQ [id number]) implanted by Dr [name] for cryptogenic stroke indication  . INGUINAL LYMPH NODE BIOPSY Left 03/10/2020   Procedure: LEFT INGUINAL LYMPH NODE BIOPSY;  Surgeon: [name], MD;  Location: [facility] ORS;  Service: General;  Laterality: Left;  . KNEE ARTHROSCOPY  Right   . SHOULDER SURGERY Right    rotator cuff repair and removal of bone spur  . TEE WITHOUT CARDIOVERSION N/A 03/23/2019   Procedure: TRANSESOPHAGEAL ECHOCARDIOGRAM (TEE);  Surgeon: [name], MD;  Location: [facility] END[facility]COPY;  Service: Cardiovascular;  Laterality: N/A;  . TEMPOROMANDIBULAR JOINT SURGERY Right   . TOTAL KNEE ARTHROPLASTY Right 05/05/2018    Procedure: TOTAL KNEE ARTHROPLASTY;  Surgeon: [name], MD;  Location: [facility] ORS;  Service: Orthopedics;  Laterality: Right;   HPI:  Amy [name] is a 84 y.o. female with medical history significant for systolic CHF with EF of 30 to 35%, cryptogenic stroke, diabetes mellitus, cognitive impairments.  Patient was brought to the ED with complaints of paranoia, hallucinations, confusion, difficulty speaking, wandering.  Patient had a follow-up visit with telepsych today, per notes patient was very confused, patient wandered out of her house with her dog a couple of nights ago, and was seen by neighbor and returned home.  This morning she was trying to leave the house again.  She is also having a lot of word finding difficulty.  On my evaluation patient is awake alert able to give me some history.  Initially her speech was moderately fluent when talking to me, but gradually she started having progressive difficulty getting her words out.  She tells me she has had a stroke before, but her speech is worse today.     Patient was treated for urinary tract infection 12/1 ED visit,  She had urinary symptoms, she was prescribed a course of cephalexin. Subsequent urine cultures showed sensitivity to cephalosporins resistance to ampicillin, ampicillin/sulbactam.  Today patient denies dysuria, she denies chest pain or difficulty breathing or pain anywhere.   Assessment / Plan / Recommendation Clinical Impression  Pt was administered the [facility] SLUMS Examination; Pt achieved a score of 13/30 indicating moderate cognitive imparment. Pt was oriented to self, person, situation and time, however she was not completely oriented to recent events or able to call the name of the hospital or the year. Pt recalled her address, details of her routine, caregivers, animals and other biographical information without incident. Short term recall was impaired and she struggled with generational naming when assessed during  structured assessment. She demonstrated basic calculations and other executive functioning abilities. When asked about going to an ALF, she reported "I don't want to go, I have two dogs that I just love, they are all I have really; I know that may not be the most practical thing to go back home, but that's what I want to do." Recommend [place] ST to complete more in depth evaluation and to treat as indicated in the home environment to facilitate independence and safety in the home. All further ST needs can be met by [facility]; ST will sign off at this time. Thank you for this referral,    SLP Assessment  SLP Recommendation/Assessment: All further Speech Lanaguage Pathology  needs can be addressed in the next venue of care SLP Visit Diagnosis: Cognitive communication deficit (R41.841)    Follow Up Recommendations  Home health SLP    Frequency and Duration min 1 x/week  1 week      SLP Evaluation Cognition  Overall Cognitive Status: History of cognitive impairments - at baseline Arousal/Alertness: Awake/alert Orientation Level: Oriented to person;Oriented to time;Oriented to situation Attention: Focused Focused Attention: Appears intact Memory: Impaired Memory Impairment: Decreased short term memory Decreased Short Term Memory: Verbal basic Awareness: Appears intact Problem Solving: Appears intact Executive  Function: Reasoning;Decision Making Reasoning: Impaired Reasoning Impairment: Verbal basic Decision Making: Impaired Decision Making Impairment: Verbal complex Safety/Judgment: Appears intact       Comprehension  Auditory Comprehension Overall Auditory Comprehension: Appears within functional limits for tasks assessed Visual Recognition/Discrimination Discrimination: Not tested Reading Comprehension Reading Status: Within funtional limits    Expression Expression Primary Mode of Expression: Verbal Verbal Expression Overall Verbal Expression: Appears within functional limits for tasks  assessed Written Expression Dominant Hand: Right Written Expression: Not tested   Oral / Motor  Oral Motor/Sensory Function Overall Oral Motor/Sensory Function: Within functional limits Motor Speech Overall Motor Speech: Appears within functional limits for tasks assessed      Kaprice Kage H. Roddie Mc, CCC-SLP Speech Language Pathologist        Wende Bushy 07/12/2020, 2:45 PM

## 2020-07-12 NOTE — Progress Notes (Signed)
PROGRESS NOTE   Amy [name]  GYJ:[id number] DOB: Aug 18, 1935 DOA: 07/10/2020 PCP: [name], MD   Brief Narrative:    84 year old white female DM TY 2 Systolic heart failure EF 35% 02/2019 Cryptogenic stroke 03/20/2019 affecting right side Anxiety depression Prior thoracolumbar decompression and fusion with instrumentation 01/2018 Dr. Trenton Gammon Invasive ductal CA right breast status post mastectomy on Femara followed by Dr. Tiburcio Bash to [facility], ED complains paranoia, hallucination, difficulty speaking with word finding difficulty-recently Rx UTI 06/28/2020 ED visit Keflex Per son--finished Abx recently--met with PCP 12/7--physically was unable to articulate and left house at night and walked to [facility] 12/13 the sitter came and patient wanted to leave " top go home" and thought it was night instead of day   Prior to UTI was able to dress self go to RR by self and need help bathing--she is somewhat coherent at time sis confused and remembering thing sna people she is wnl   Head CT negative Portable CXR negative venous ultrasound negative for DVT but showed large multiloculated fluid collection in the left groin therefore general surgery consulted   Assessment & Plan:   Principal Problem:   Difficulty with speech Active Problems:   Invasive ductal carcinoma of breast, female, right ([place])   Type 2 diabetes mellitus with neurological complications (HCC)   Chronic systolic CHF (congestive heart failure) (HCC)   Acute metabolic encephalopathy   Cognitive impairment   Cryptogenic stroke (HCC)   Postprocedural seroma of skin and subcutaneous tissue following other procedure   1. Acute toxic metabolic encephalopathy a. Quite sleepy still but seems to be able to verbalize yes or no and respond to simple questions b. Continue to hold multiple medications from admission including gabapentin 300, have cut back Risperdal 2.5 at bedtime discontinue trazodone c. Suspect this  is all related to polypharmacy 2. Multiloculated fluid collection left inguinal region 10.8 X4.0X 5.7 a. Aspiration performed with thanks by general surgery b. Gram stain 12/14 shows no specific findings-cultures are pending but do not anticipate will need further work-up or medications 3. DM TY 2 on Metformin a1c 6.1 a. CBGs are reasonably well controlled 1 50-1 60 range continue sliding scale blood sugar b. Resume Metformin in 24 hours 4. HTN 5. Systolic heart failure compensated EF 35 to 40% a. Losartan 12.5 has been held, metoprolol 25 XL held b. Resume in the next several days 6. Cognitive impairment a. Aricept 5 mg continued b. Has some underlying dementia and would hold off on Ditropan 5 and other medications that can cause confusion 7. Stage Ia breast cancer status post lumpectomy 2018 a. Outpatient follow-up with oncology  DVT prophylaxis: Lovenox Code Status: Full Family Communication: called Lenna Sciara jones son [phone] Disposition:  Status is: Observation  The patient will require care spanning > 2 midnights and should be moved to inpatient because: Persistent severe electrolyte disturbances, Ongoing active pain requiring inpatient pain management and Ongoing diagnostic testing needed not appropriate for outpatient work up  Dispo: The patient is from: Home              Anticipated d/c is to: ALF              Anticipated d/c date is: 2 days              Patient currently is not medically stable to d/c.       Consultants:   None  Procedures: No  Antimicrobials: None   Subjective: Arousable and answers in monosyllables  way but not terribly confused Seems quite sleepy overall though  Objective: Vitals:   07/11/20 0940 07/11/20 1219 07/11/20 2120 07/12/20 0357  BP: (!) 158/73 (!) 151/80 (!) 162/73 135/80  Pulse: 66 72 75 (!) 106  Resp: $Remo'17 20 18 19  'crXiy$ Temp: 98 F (36.7 C) 97.9 F (36.6 C) 97.9 F (36.6 C) 98.1 F (36.7 C)  TempSrc: Oral Oral    SpO2: 98% 99% 97%  94%  Weight: 69.3 kg     Height:        Intake/Output Summary (Last 24 hours) at 07/12/2020 3419 Last data filed at 07/11/2020 1700 Gross per 24 hour  Intake 120 ml  Output 825 ml  Net -705 ml   Filed Weights   07/10/20 1312 07/11/20 0940  Weight: 74.6 kg 69.3 kg    Examination:  EOMI NCAT no focal deficit slow speech Chest clear no added sound Raises arms above bed does not seem to have any focal deficits but then becomes somewhat reluctant to move and refuses further exam S1-S2 no murmur Chest clear no added sound  Data Reviewed: I have personally reviewed following labs and imaging studies   Radiology Studies: DG Chest 1 View  Result Date: 07/10/2020 CLINICAL DATA:  84 year old female with altered mental status. EXAM: CHEST  1 VIEW COMPARISON:  Chest radiograph dated 12/11/2019. FINDINGS: Minimal left lung base atelectasis. No focal consolidation, pleural effusion, pneumothorax. Stable cardiac silhouette. Atherosclerotic calcification of the aorta. Loop recorder device. No acute osseous pathology. Partially visualized thoracolumbar fusion hardware. IMPRESSION: No active cardiopulmonary disease. Electronically Signed   By: Anner Crete M.D.   On: 07/10/2020 17:05   CT Head Wo Contrast  Result Date: 07/10/2020 CLINICAL DATA:  Hallucinations, confusion. EXAM: CT HEAD WITHOUT CONTRAST TECHNIQUE: Contiguous axial images were obtained from the base of the skull through the vertex without intravenous contrast. COMPARISON:  Dec 11, 2019. FINDINGS: Brain: Mild chronic ischemic white matter disease is noted. No mass effect or midline shift is noted. Ventricular size is within normal limits. There is no evidence of mass lesion, hemorrhage or acute infarction. Vascular: No hyperdense vessel or unexpected calcification. Skull: Normal. Negative for fracture or focal lesion. Sinuses/Orbits: No acute finding. Other: None. IMPRESSION: Mild chronic ischemic white matter disease. No acute  intracranial abnormality seen. Electronically Signed   By: Marijo Conception M.D.   On: 07/10/2020 17:57   MR BRAIN WO CONTRAST  Result Date: 07/11/2020 CLINICAL DATA:  Altered mental status, aphasia EXAM: MRI HEAD WITHOUT CONTRAST TECHNIQUE: Multiplanar, multiecho pulse sequences of the brain and surrounding structures were obtained without intravenous contrast. COMPARISON:  08/17/2019 FINDINGS: Significant motion artifact is present. Several sequences are nondiagnostic. T2 FLAIR sequence was not obtained. There is no acute infarction. No mass effect or hydrocephalus. Probable chronic microvascular ischemic changes. IMPRESSION: Significantly motion degraded.  No acute infarction. Electronically Signed   By: Macy Mis M.D.   On: 07/11/2020 09:51   US Venous Img Lower  Left (DVT Study)  Result Date: 07/10/2020 CLINICAL DATA:  Pain and swelling LEFT leg, altered mental status EXAM: LEFT LOWER EXTREMITY VENOUS DOPPLER ULTRASOUND TECHNIQUE: Gray-scale sonography with compression, as well as color and duplex ultrasound, were performed to evaluate the deep venous system(s) from the level of the common femoral vein through the popliteal and proximal calf veins. COMPARISON:  06/04/2019 Correlation: PET-CT 02/14/2020 FINDINGS: VENOUS Normal compressibility of the common femoral, superficial femoral, and popliteal veins, as well as the visualized calf veins. Visualized portions of profunda femoral  vein and great saphenous vein unremarkable. No filling defects to suggest DVT on grayscale or color Doppler imaging. Doppler waveforms show normal direction of venous flow, normal respiratory plasticity and response to augmentation. Limited views of the contralateral common femoral vein are unremarkable. OTHER Large multiloculated cystic mass in the LEFT inguinal region 10.8 x 4.0 x 5.7 cm in size; patient had multiple enlarged hypermetabolic LEFT inguinal lymph nodes at this site on prior PET-CT exam, chart indicating  prior LEFT inguinal lymph node excisions on 03/10/2020 consistent with a postoperative collection. Limitations: None IMPRESSION: No evidence of deep venous thrombosis in the LEFT lower extremity. Large multiloculated fluid collection in the LEFT inguinal region 10.8 x 4.0 x 5.7 cm most likely representing a postoperative collection; this could represent lymphocele, seroma, or old hematoma. Electronically Signed   By: Lavonia Dana M.D.   On: 07/10/2020 17:45     Scheduled Meds: . ALPRAZolam  0.5 mg Oral QHS  . aspirin EC  81 mg Oral q morning - 10a  . donepezil  5 mg Oral Daily  . enoxaparin (LOVENOX) injection  40 mg Subcutaneous Q24H  . insulin aspart  0-5 Units Subcutaneous QHS  . insulin aspart  0-9 Units Subcutaneous TID WC  . letrozole  2.5 mg Oral Daily  . risperiDONE  0.5 mg Oral QHS  . traZODone  50 mg Oral QHS  . tuberculin  5 Units Intradermal Once  . venlafaxine XR  150 mg Oral Q breakfast   Continuous Infusions:   LOS: 0 days    Time spent: 65 min  Nita Sells, MD [facility] To contact the attending provider between 7A-7P or the covering provider during after hours 7P-7A, please log into the web site [email or web address] and access using universal [place] password for that web site. If you do not have the password, please call the hospital operator.  07/12/2020, 7:16 AM

## 2020-07-12 NOTE — Progress Notes (Signed)
[facility]  Notified son that all work up with culture and triglycerides negative. Post op seroma. If reaccumulates can call the office for another aspiration.  No dressing on it when I saw her.  Area soft and no signs of reaccumulation, no erythema and dressing replaced.  Curlene Labrum, MD [facility] [address] [place],  [phone] (office)

## 2020-07-12 NOTE — TOC Progression Note (Addendum)
Transition of Care [facility]) - Progression Note   Patient Details  Name: Amy [name] MRN: [id number] Date of Birth: Apr 17, 1936  Transition of Care [facility]) CM/SW [place], LCSW Phone Number: 07/12/2020, 11:28 AM  Clinical Narrative: PT evaluation recommended HH. Due to limited [facility] agencies accepting [facility], referral was made to Judson Roch with [facility].  CSW spoke with son regarding his request that patient be placed at an ALF. CSW explained that in the past 24 hours the patient has been oriented x3-4, so even though the patient has a dementia diagnosis she does not necessarily meet the legal criteria for lacking capacity. Son reiterated he has durable POA and HCPOA for the patient. CSW explained that TOC cannot compel the patient to go to an ALF without her consent given her orientation. CSW also explained that since the patient was referred to [facility] and a social worker was requested, that if it is determined the patient lacks capacity the SW can assist the son with transitioning her to [place] [facility] as the [facility] part of [facility] works closely with the [place]. Son feels patient does not have capacity and reported he has been trying to get the patient to an ALF for about a year, but cannot get her in the car.  CSW discussed referral to DSS to see if they would agree to see the patient to assess for capacity. Son agreeable to referral. Voicemail left with DSS supervisor and return call requested. TOC to follow.  Addendum: 12:10pm-CSW received call back from [place], Jolene Schimke. Per Ms. Bobette Mo, the patient seems to have capacity at this time and the patient would need to lose capacity in order for the [facility] to kick in. DSS will not be able to assess the patient in her home.  Expected Discharge Plan: [place] (TBD) Barriers to Discharge: Continued Medical Work up  Expected Discharge Plan and Services Expected Discharge Plan: [place] (TBD) In-house Referral: Clinical Social Work Post Acute Care Choice: [place] arrangements for the past 2 months: [place]: PT,RN,Social Work [place]: [place] Date [place]: 07/12/20 Time [place]: [phone] Representative spoke with at [place]: [place]  Readmission Risk Interventions Readmission Risk Prevention Plan 08/19/2019  Transportation Screening Complete  PCP or Specialist Appt within 3-5 Days Not Complete  HRI or [place] Complete  Social Work Consult for [place] Planning/Counseling [place] Not Complete  Medication Review Press photographer) Complete  Some recent data might be hidden

## 2020-07-12 NOTE — Progress Notes (Signed)
OT Cancellation Note  Patient Details Name: Amy [name] MRN: [id number] DOB: 06/30/1936   Cancelled Treatment:    Reason Eval/Treat Not Completed: Pain limiting ability to participate. Attempting to work with pt this am, pt reporting discomfort in buttocks and incision site at groin. Pt repeating that she just wanted to get comfortable, OT attempting to motivate to reposition however pt refusing all options for positioning and unable to be successfully redirected. Will attempt at a later time when pt more willing to cooperate.   [name], OTR/L  [phone] 07/12/2020, 8:17 AM

## 2020-07-12 NOTE — Progress Notes (Signed)
OT Cancellation Note  Patient Details Name: Amy [name] MRN: [id number] DOB: Sep 12, 1935   Cancelled Treatment:    Reason Eval/Treat Not Completed: Patient at procedure or test/ unavailable. Attempted to see patient for OT evaluation this afternoon. Pt was with Nurse tech and was receiving a bed bath. Will re-attempt evaluation when able.    Ailene Ravel, OTR/L,CBIS  [phone]  07/12/2020, 3:07 PM

## 2020-07-12 NOTE — Progress Notes (Signed)
Initial visit for spiritual support for [name]. She was resting during this brief visit.

## 2020-07-13 ENCOUNTER — Ambulatory Visit (INDEPENDENT_AMBULATORY_CARE_PROVIDER_SITE_OTHER): Payer: Medicare HMO

## 2020-07-13 DIAGNOSIS — I639 Cerebral infarction, unspecified: Secondary | ICD-10-CM

## 2020-07-13 LAB — COMPREHENSIVE METABOLIC PANEL
ALT: 11 U/L (ref 0–44)
AST: 14 U/L — ABNORMAL LOW (ref 15–41)
Albumin: 3.6 g/dL (ref 3.5–5.0)
Alkaline Phosphatase: 45 U/L (ref 38–126)
Anion gap: 9 (ref 5–15)
BUN: 15 mg/dL (ref 8–23)
CO2: 29 mmol/L (ref 22–32)
Calcium: 9.3 mg/dL (ref 8.9–10.3)
Chloride: 100 mmol/L (ref 98–111)
Creatinine, Ser: 0.83 mg/dL (ref 0.44–1.00)
GFR, Estimated: >60 mL/min (ref >60)
Glucose, Bld: 175 mg/dL — ABNORMAL HIGH (ref 70–99)
Potassium: 3.6 mmol/L (ref 3.5–5.1)
Sodium: 138 mmol/L (ref 135–145)
Total Bilirubin: 0.7 mg/dL (ref 0.3–1.2)
Total Protein: 6.2 g/dL — ABNORMAL LOW (ref 6.5–8.1)

## 2020-07-13 LAB — GLUCOSE, CAPILLARY
Glucose-Capillary: 170 mg/dL — ABNORMAL HIGH (ref 70–99)
Glucose-Capillary: 285 mg/dL — ABNORMAL HIGH (ref 70–99)
Glucose-Capillary: 241 mg/dL — ABNORMAL HIGH (ref 70–99)
Glucose-Capillary: 164 mg/dL — ABNORMAL HIGH (ref 70–99)

## 2020-07-13 LAB — CBC WITH DIFFERENTIAL/PLATELET
Abs Immature Granulocytes: 0.03 10*3/uL (ref 0.00–0.07)
Basophils Absolute: 0 10*3/uL (ref 0.0–0.1)
Basophils Relative: 0 %
Eosinophils Absolute: 0.2 10*3/uL (ref 0.0–0.5)
Eosinophils Relative: 2 %
HCT: 41.4 % (ref 36.0–46.0)
Hemoglobin: 13.6 g/dL (ref 12.0–15.0)
Immature Granulocytes: 0 %
Lymphocytes Relative: 14 %
Lymphs Abs: 1 10*3/uL (ref 0.7–4.0)
MCH: 30.2 pg (ref 26.0–34.0)
MCHC: 32.9 g/dL (ref 30.0–36.0)
MCV: 91.8 fL (ref 80.0–100.0)
Monocytes Absolute: 0.7 10*3/uL (ref 0.1–1.0)
Monocytes Relative: 9 %
Neutro Abs: 5.3 10*3/uL (ref 1.7–7.7)
Neutrophils Relative %: 75 %
Platelets: 230 10*3/uL (ref 150–400)
RBC: 4.51 MIL/uL (ref 3.87–5.11)
RDW: 12.4 % (ref 11.5–15.5)
WBC: 7.2 10*3/uL (ref 4.0–10.5)
nRBC: 0 % (ref 0.0–0.2)

## 2020-07-13 MED ORDER — METOPROLOL SUCCINATE ER 25 MG PO TB24
25.0000 mg | ORAL_TABLET | Freq: Every day | ORAL | Status: DC
  Administered 2020-07-13 – 2020-07-14 (×2): 25 mg via ORAL
  Filled 2020-07-13 (×2): qty 1

## 2020-07-13 NOTE — Progress Notes (Signed)
Physical Therapy Treatment Patient Details Name: Amy [name] MRN: [id number] DOB: 06/14/1936 Today's Date: 07/13/2020    History of Present Illness Amy [name] is a 84 y.o. female with medical history significant for systolic CHF with EF of 30 to 35%, cryptogenic stroke, diabetes mellitus, cognitive impairments.  Patient was brought to the ED with complaints of paranoia, hallucinations, confusion, difficulty speaking, wandering.  Patient had a follow-up visit with telepsych today, per notes patient was very confused, patient wandered out of her house with her dog a couple of nights ago, and was seen by neighbor and returned home.  This morning she was trying to leave the house again.  She is also having a lot of word finding difficulty.  On my evaluation patient is awake alert able to give me some history.  Initially her speech was moderately fluent when talking to me, but gradually she started having progressive difficulty getting her words out.  She tells me she has had a stroke before, but her speech is worse today.     Patient was treated for urinary tract infection 12/1 ED visit,  She had urinary symptoms, she was prescribed a course of cephalexin. Subsequent urine cultures showed sensitivity to cephalosporins resistance to ampicillin, ampicillin/sulbactam.  Today patient denies dysuria, she denies chest pain or difficulty breathing or pain anywhere.    PT Comments    Patient in bed where she stated she did not want to walk or move with PT, but would complete bed exercises detailed below, to remain strong. She continued to verbalize that she was going home and not to a SNF. She was able to complete her bed exercises well with intermittent fatigue, and promised PT that she would walk with PT in the morning. Patient will benefit from continued physical therapy in hospital and recommended venue below to increase strength, balance, endurance for safe ADLs and gait.    Follow Up  Recommendations  Home health PT;Supervision - Intermittent;Supervision for mobility/OOB     Equipment Recommendations  None recommended by PT    Recommendations for Other Services       Precautions / Restrictions Precautions Precautions: None Restrictions Weight Bearing Restrictions: No    Mobility  Bed Mobility                  Transfers                    Ambulation/Gait                 Stairs             Wheelchair Mobility    Modified Rankin (Stroke Patients Only)       Balance                                            Cognition Arousal/Alertness: Awake/alert Behavior During Therapy: WFL for tasks assessed/performed Overall Cognitive Status: History of cognitive impairments - at baseline                                        Exercises Total Joint Exercises Ankle Circles/Pumps: AROM;Strengthening;Both;10 reps;Supine Gluteal Sets: AROM;Both;Strengthening;Supine;10 reps Heel Slides: AROM;Strengthening;Both;10 reps;Supine    General Comments        Pertinent Vitals/Pain Pain Assessment: No/denies  pain    Home Living                      Prior Function            PT Goals (current goals can now be found in the care plan section) Acute Rehab PT Goals Patient Stated Goal: return home PT Goal Formulation: With patient Time For Goal Achievement: 07/25/20 Potential to Achieve Goals: Good Progress towards PT goals: Progressing toward goals    Frequency    Min 3X/week      PT Plan Current plan remains appropriate    Co-evaluation              AM-PAC PT "6 Clicks" Mobility   Outcome Measure  Help needed turning from your back to your side while in a flat bed without using bedrails?: A Little Help needed moving from lying on your back to sitting on the side of a flat bed without using bedrails?: A Little   Help needed standing up from a chair using your arms  (e.g., wheelchair or bedside chair)?: A Little Help needed to walk in hospital room?: A Little Help needed climbing 3-5 steps with a railing? : A Lot 6 Click Score: 14    End of Session   Activity Tolerance: Patient limited by fatigue Patient left: in bed;with call bell/phone within reach Nurse Communication: Mobility status PT Visit Diagnosis: Unsteadiness on feet (R26.81);Other abnormalities of gait and mobility (R26.89);Repeated falls (R29.6);Muscle weakness (generalized) (M62.81)     Time: 0881-1031 PT Time Calculation (min) (ACUTE ONLY): 10 min  Charges:  $Therapeutic Exercise: 8-22 mins                     3:47 PM,07/13/20 Domenic Moras, PT, DPT Physical Therapist at [facility]

## 2020-07-13 NOTE — Evaluation (Signed)
Occupational Therapy Evaluation Patient Details Name: Amy [name] MRN: [id number] DOB: 05-25-1936 Today's Date: 07/13/2020    History of Present Illness Amy [name] is a 84 y.o. female with medical history significant for systolic CHF with EF of 30 to 35%, cryptogenic stroke, diabetes mellitus, cognitive impairments.  Patient was brought to the ED with complaints of paranoia, hallucinations, confusion, difficulty speaking, wandering.  Patient had a follow-up visit with telepsych today, per notes patient was very confused, patient wandered out of her house with her dog a couple of nights ago, and was seen by neighbor and returned home.  This morning she was trying to leave the house again.  She is also having a lot of word finding difficulty.  On my evaluation patient is awake alert able to give me some history.  Initially her speech was moderately fluent when talking to me, but gradually she started having progressive difficulty getting her words out.  She tells me she has had a stroke before, but her speech is worse today.     Patient was treated for urinary tract infection 12/1 ED visit,  She had urinary symptoms, she was prescribed a course of cephalexin. Subsequent urine cultures showed sensitivity to cephalosporins resistance to ampicillin, ampicillin/sulbactam.  Today patient denies dysuria, she denies chest pain or difficulty breathing or pain anywhere.   Clinical Impression   Pt agreeable to OT evaluation, oriented to person, place, and time, unsure of situation.  Pt performing seated ADLs with set-up, declining to attempting standing tasks at sink today. Pt completing mobility and transfer tasks at supervision/min guard level. Of note: during session pt stating "I just don't like this", when questioned about what, pt reporting "all these kids painting their shirts." Recommend HH OT services on discharge to improve safety and independence during ADLs.     Follow Up Recommendations   Home health OT;Supervision - Intermittent    Equipment Recommendations  None recommended by OT       Precautions / Restrictions Precautions Precautions: Fall Restrictions Weight Bearing Restrictions: No      Mobility Bed Mobility Overal bed mobility: Needs Assistance Bed Mobility: Supine to Sit     Supine to sit: Supervision          Transfers Overall transfer level: Needs assistance Equipment used: Rolling walker (2 wheeled) Transfers: Sit to/from [employer] Sit to Stand: Min guard Stand pivot transfers: Min guard                ADL either performed or assessed with clinical judgement   ADL Overall ADL's : Needs assistance/impaired Eating/Feeding: Modified independent;Sitting Eating/Feeding Details (indicate cue type and reason): pt sitting up in chair, reaching for drink, no difficulty Grooming: Set up;Sitting           Upper Body Dressing : Minimal assistance;Sitting Upper Body Dressing Details (indicate cue type and reason): pt doffing gown with assist to untie back, donning with assist to tie back     Toilet Transfer: Min Geophysical data processor Details (indicate cue type and reason): simulated with transfer to chair                 Vision Baseline Vision/History: No visual deficits Patient Visual Report: No change from baseline Vision Assessment?: No apparent visual deficits            Pertinent Vitals/Pain Pain Assessment: No/denies pain     Hand Dominance Right   Extremity/Trunk Assessment Upper Extremity Assessment Upper Extremity Assessment: Generalized weakness  Lower Extremity Assessment Lower Extremity Assessment: Defer to PT evaluation   Cervical / Trunk Assessment Cervical / Trunk Assessment: Normal   Communication Communication Communication: No difficulties   Cognition Arousal/Alertness: Awake/alert Behavior During Therapy: WFL for tasks assessed/performed Overall Cognitive  Status: History of cognitive impairments - at baseline                                                Home Living   Living Arrangements: Alone Available Help at Discharge: Family;Available PRN/intermittently;Other (Comment) Type of Home: House Home Access: Stairs to enter [employer] of Steps: 2 Entrance Stairs-Rails: Right Home Layout: Two level;Able to live on main level with bedroom/bathroom Alternate Level Stairs-Number of Steps: states does not go upstairs Alternate Level Stairs-Rails: Left Bathroom Shower/Tub: Teacher, early years/pre: Standard     Home Equipment: Environmental consultant - 2 wheels;Cane - single point;Bedside commode;Tub bench      Lives With: Alone    Prior Functioning/Environment Level of Independence: Needs assistance  Gait / Transfers Assistance Needed: household ambulator with Rollator ADL's / Homemaking Assistance Needed: assisted by family for community ADLs and some household            OT Problem List: Decreased strength;Decreased activity tolerance;Impaired balance (sitting and/or standing);Decreased cognition;Decreased safety awareness;Decreased knowledge of use of DME or AE      OT Treatment/Interventions: Self-care/ADL training;Therapeutic exercise;Therapeutic activities;Patient/family education;DME and/or AE instruction    OT Goals(Current goals can be found in the care plan section) Acute Rehab OT Goals Patient Stated Goal: return home OT Goal Formulation: With patient Time For Goal Achievement: 07/27/20 Potential to Achieve Goals: Good  OT Frequency: Min 1X/week   Barriers to D/C: Decreased caregiver support                End of Session Equipment Utilized During Treatment: Gait belt;Rolling walker  Activity Tolerance: Patient tolerated treatment well Patient left: in chair;with call bell/phone within reach;with chair alarm set  OT Visit Diagnosis: Muscle weakness (generalized) (M62.81)                 Time: 4540-9811 OT Time Calculation (min): 21 min Charges:  OT General Charges $OT Visit: 1 Visit OT Evaluation $OT Eval Low Complexity: 1 Low   [name], OTR/L  [phone] 07/13/2020, 8:23 AM

## 2020-07-13 NOTE — Progress Notes (Signed)
PROGRESS NOTE   Amy [name]  SAY:[id number] DOB: [birth year] DOA: 07/10/2020 PCP: [name], MD   Brief Narrative:    84 year old white female DM TY 2 Systolic heart failure EF 35% 02/2019 Cryptogenic stroke 03/20/2019 affecting right side Anxiety depression Prior thoracolumbar decompression and fusion with instrumentation 01/2018 Dr. Trenton Gammon Invasive ductal CA right breast status post mastectomy on Femara followed by Dr. Tiburcio Bash to [facility], ED complains paranoia, hallucination, difficulty speaking with word finding difficulty-recently Rx UTI 06/28/2020 ED visit Keflex Per son--finished Abx recently--met with PCP 12/7--physically was unable to articulate and left house at night and walked to [facility] 12/13 the sitter came and patient wanted to leave " top go home" and thought it was night instead of day   Prior to UTI was able to dress self go to RR by self and need help bathing--she is somewhat coherent at time sis confused and remembering thing sna people she is wnl   Head CT negative Portable CXR negative venous ultrasound negative for DVT but showed large multiloculated fluid collection in the left groin therefore general surgery consulted   Assessment & Plan:   Principal Problem:   Difficulty with speech Active Problems:   Invasive ductal carcinoma of breast, female, right ([place])   Type 2 diabetes mellitus with neurological complications (HCC)   Chronic systolic CHF (congestive heart failure) (HCC)   Acute metabolic encephalopathy   Cognitive impairment   Cryptogenic stroke (HCC)   Postprocedural seroma of skin and subcutaneous tissue following other procedure   Confusion   1. Acute toxic metabolic encephalopathy a. More awake alert--note SLUMS test 13/30, answering 1/2 of the capacity questions b. Continue to hold multiple medications from admission including gabapentin 300, have cut back Risperdal 2.5 at bedtime discontinue trazodone c. Suspect this is  all related to polypharmacy 2. Multiloculated fluid collection left inguinal region 10.8 X4.0X 5.7 a. Aspiration performed with thanks by general surgery b. Gram stain 12/14 shows no specific findings-cultures NGTD since 12/14 3. DM TY 2 on Metformin a1c 6.1 a. CBGs are reasonably well controlled 170-280 range continue sliding scale blood sugar b. Resume Metformin in 24 hours 4. HTN 5. Systolic heart failure compensated EF 35 to 40% a. Losartan 12.5 has been held, metoprolol 25 XL resumed on 12/26 given tachycardia b. Monitor trends carefully over next 24 h 6. Cognitive impairment a. Aricept 5 mg continued b. Has some underlying dementia and would hold off on Ditropan 5 and other medications that can cause confusion 7. Stage Ia breast cancer status post lumpectomy 2018 a. Outpatient follow-up with oncology  DVT prophylaxis: Lovenox Code Status: Full Family Communication: called J jones son [phone] and updated Disposition:  Status is: Observation  The patient will require care spanning > 2 midnights and should be moved to inpatient because: Persistent severe electrolyte disturbances, Ongoing active pain requiring inpatient pain management and Ongoing diagnostic testing needed not appropriate for outpatient work up  Dispo: The patient is from: Home              Anticipated d/c is to: ALF              Anticipated d/c date is: 2 days              Patient currently is not medically stable to d/c.   Consultants:   None  Procedures: No  Antimicrobials: None   Subjective:  Agitated "I dont want to go to Nursing home" No other c/o  Objective: Vitals:  07/12/20 0357 07/12/20 1415 07/12/20 2006 07/13/20 0457  BP: 135/80 109/78 140/76 140/79  Pulse: (!) 106 (!) 108 85 76  Resp: $Remo'19 19 19 17  'oTaaF$ Temp: 98.1 F (36.7 C) (!) 97.5 F (36.4 C) 97.7 F (36.5 C) (!) 97.5 F (36.4 C)  TempSrc:  Oral    SpO2: 94% 94% 97% 94%  Weight:      Height:        Intake/Output Summary  (Last 24 hours) at 07/13/2020 1334 Last data filed at 07/13/2020 7121 Gross per 24 hour  Intake 240 ml  Output 1000 ml  Net -760 ml   Filed Weights   07/10/20 1312 07/11/20 0940  Weight: 74.6 kg 69.3 kg    Examination:  EOMI NCAT no focal deficit  Chest clear no added sound Raises arms above bed does not seem to have any focal deficits but then becomes somewhat reluctant to move and refuses further exam S1-S2 no murmur Chest clear no added sound  Data Reviewed: I have personally reviewed following labs and imaging studies  Sodium 138, K 3.6 Wbc 7.2 Hb 13.6  Radiology Studies: No results found.   Scheduled Meds: . ALPRAZolam  0.5 mg Oral QHS  . aspirin EC  81 mg Oral q morning - 10a  . donepezil  5 mg Oral Daily  . enoxaparin (LOVENOX) injection  40 mg Subcutaneous Q24H  . insulin aspart  0-5 Units Subcutaneous QHS  . insulin aspart  0-9 Units Subcutaneous TID WC  . letrozole  2.5 mg Oral Daily  . metoprolol succinate  25 mg Oral Daily  . risperiDONE  0.5 mg Oral QHS  . venlafaxine XR  150 mg Oral Q breakfast   Continuous Infusions:   LOS: 1 day    Time spent: 90 min  Nita Sells, MD [facility] To contact the attending provider between 7A-7P or the covering provider during after hours 7P-7A, please log into the web site [email or web address] and access using universal [place] password for that web site. If you do not have the password, please call the hospital operator.  07/13/2020, 1:34 PM

## 2020-07-13 NOTE — Plan of Care (Signed)
  Problem: Acute Rehab OT Goals (only OT should resolve) Goal: Pt. Will Perform Grooming Flowsheets (Taken 07/13/2020 0831) Pt Will Perform Grooming:  with supervision  standing Goal: Pt. Will Perform Upper Body Dressing Flowsheets (Taken 07/13/2020 0831) Pt Will Perform Upper Body Dressing:  with modified independence  sitting Goal: Pt. Will Perform Lower Body Dressing Flowsheets (Taken 07/13/2020 0831) Pt Will Perform Lower Body Dressing:  with supervision  sitting/lateral leans  sit to/from stand Goal: Pt. Will Transfer To Toilet Flowsheets (Taken 07/13/2020 0831) Pt Will Transfer to Toilet:  with supervision  ambulating  regular height toilet  grab bars Goal: Pt. Will Perform Toileting-Clothing Manipulation Flowsheets (Taken 07/13/2020 0831) Pt Will Perform Toileting - Clothing Manipulation and hygiene:  with supervision  sitting/lateral leans  sit to/from stand

## 2020-07-14 LAB — GLUCOSE, CAPILLARY
Glucose-Capillary: 173 mg/dL — ABNORMAL HIGH (ref 70–99)
Glucose-Capillary: 183 mg/dL — ABNORMAL HIGH (ref 70–99)

## 2020-07-14 LAB — CBC WITH DIFFERENTIAL/PLATELET
Abs Immature Granulocytes: 0.03 10*3/uL (ref 0.00–0.07)
Basophils Absolute: 0 10*3/uL (ref 0.0–0.1)
Basophils Relative: 0 %
Eosinophils Absolute: 0.2 10*3/uL (ref 0.0–0.5)
Eosinophils Relative: 3 %
HCT: 39.6 % (ref 36.0–46.0)
Hemoglobin: 13 g/dL (ref 12.0–15.0)
Immature Granulocytes: 1 %
Lymphocytes Relative: 15 %
Lymphs Abs: 0.9 10*3/uL (ref 0.7–4.0)
MCH: 30.2 pg (ref 26.0–34.0)
MCHC: 32.8 g/dL (ref 30.0–36.0)
MCV: 91.9 fL (ref 80.0–100.0)
Monocytes Absolute: 0.5 10*3/uL (ref 0.1–1.0)
Monocytes Relative: 9 %
Neutro Abs: 4.1 10*3/uL (ref 1.7–7.7)
Neutrophils Relative %: 72 %
Platelets: 241 10*3/uL (ref 150–400)
RBC: 4.31 MIL/uL (ref 3.87–5.11)
RDW: 12.4 % (ref 11.5–15.5)
WBC: 5.7 10*3/uL (ref 4.0–10.5)
nRBC: 0 % (ref 0.0–0.2)

## 2020-07-14 LAB — COMPREHENSIVE METABOLIC PANEL
ALT: 11 U/L (ref 0–44)
AST: 15 U/L (ref 15–41)
Albumin: 3.6 g/dL (ref 3.5–5.0)
Alkaline Phosphatase: 42 U/L (ref 38–126)
Anion gap: 9 (ref 5–15)
BUN: 15 mg/dL (ref 8–23)
CO2: 29 mmol/L (ref 22–32)
Calcium: 9.4 mg/dL (ref 8.9–10.3)
Chloride: 100 mmol/L (ref 98–111)
Creatinine, Ser: 0.77 mg/dL (ref 0.44–1.00)
GFR, Estimated: >60 mL/min (ref >60)
Glucose, Bld: 180 mg/dL — ABNORMAL HIGH (ref 70–99)
Potassium: 3.6 mmol/L (ref 3.5–5.1)
Sodium: 138 mmol/L (ref 135–145)
Total Bilirubin: 0.6 mg/dL (ref 0.3–1.2)
Total Protein: 6.2 g/dL — ABNORMAL LOW (ref 6.5–8.1)

## 2020-07-14 LAB — AEROBIC CULTURE W GRAM STAIN (SUPERFICIAL SPECIMEN)
Culture: NO GROWTH
Gram Stain: NONE SEEN

## 2020-07-14 MED ORDER — VENLAFAXINE HCL ER 150 MG PO CP24: 150.0000 mg | ORAL_CAPSULE | Freq: Every day | ORAL | Status: DC

## 2020-07-14 MED ORDER — GABAPENTIN 100 MG PO CAPS: 100.0000 mg | ORAL_CAPSULE | Freq: Every day | ORAL | 2 refills | Status: DC

## 2020-07-14 MED ORDER — RISPERIDONE 0.5 MG PO TABS: 0.5000 mg | ORAL_TABLET | Freq: Every day | ORAL | Status: DC

## 2020-07-14 MED ORDER — GABAPENTIN 100 MG PO CAPS: 300.0000 mg | ORAL_CAPSULE | Freq: Every day | ORAL | 2 refills | Status: DC

## 2020-07-14 NOTE — Discharge Summary (Signed)
Physician Discharge Summary  MESA JANUS IFO:[id number] DOB: 1936/03/25 DOA: 07/10/2020  PCP: [name], MD  Admit date: 07/10/2020 Discharge date: 07/14/2020  Time spent: 27 minutes  Recommendations for Outpatient Follow-up:  1. Needs outpatient coordination with psychology 2. Note medication changes of discontinuation of trazodone cut back dose of gabapentin and lower dose of Risperdal 3. Needs Chem-12 CBC 1 week 4. Recommend continued follow-up with her oncologist improved  Discharge Diagnoses:  Principal Problem:   Difficulty with speech Active Problems:   Invasive ductal carcinoma of breast, female, right ([place])   Type 2 diabetes mellitus with neurological complications (HCC)   Chronic systolic CHF (congestive heart failure) (HCC)   Acute metabolic encephalopathy   Cognitive impairment   Cryptogenic stroke (HCC)   Postprocedural seroma of skin and subcutaneous tissue following other procedure   Confusion   Discharge Condition: Improved  Diet recommendation: Heart healthy  Filed Weights   07/10/20 1312 07/11/20 0940  Weight: 74.6 kg 69.3 kg    History of present illness:  84 year old white female DM TY 2 Systolic heart failure EF 35% 02/2019 Cryptogenic stroke 03/20/2019 affecting right side Anxiety depression Prior thoracolumbar decompression and fusion with instrumentation 01/2018 Dr. Trenton Gammon Invasive ductal CA right breast status post mastectomy on Femara followed by Dr. Tiburcio Bash to [facility], ED complains paranoia, hallucination, difficulty speaking with word finding difficulty-recently Rx UTI 06/28/2020 ED visit Keflex Per son--finished Abx recently--met with PCP 12/7--physically was unable to articulate and left house at night and walked to [facility] 12/13 the sitter came and patient wanted to leave " top go home" and thought it was night instead of day   Prior to UTI was able to dress self go to RR by self and need help bathing--she is  somewhat coherent at time sis confused and remembering thing sna people she is wnl   Head CT negative Portable CXR negative venous ultrasound negative for DVT but showed large multiloculated fluid collection in the left groin therefore general surgery consulted   Hospital Course:   1. Acute toxic metabolic encephalopathy a. More awake alert--note SLUMS test 13/30, answering 1/2 of the capacity questions b. Medications adjusted cut back dose of gabapentin to 100 cut back Resporal to 0.5 mg at bedtime discontinue trazodone c. Suspect this is all related to polypharmacy 2. Multiloculated fluid collection left inguinal region 10.8 X4.0X 5.7 a. Aspiration performed with thanks by general surgery b. Gram stain 12/14 shows no specific findings-cultures NGTD since 12/14 3. DM TY 2 on Metformin a1c 6.1 a. CBGs ranging 1 60-1 80 on sliding scale b. Resume Metformin on discharge home 4. HTN 5. Systolic heart failure compensated EF 35 to 40% a. Losartan 12.5 has been held, metoprolol 25 XL resumed on 12/26 given tachycardia b. Monitor trends carefully over next 24 h 6. Cognitive impairment a. Aricept 5 mg continued b. Has some underlying dementia and would hold off on Ditropan 5 and other medications that can cause confusion 7. Stage Ia breast cancer status post lumpectomy 2018 a. Outpatient follow-up with oncology  Consultations:  General surgery  Discharge Exam: Vitals:   07/13/20 2041 07/14/20 0408  BP: (!) 153/85 (!) 141/77  Pulse: 80 84  Resp: 19 19  Temp: 98.1 F (36.7 C) 98.5 F (36.9 C)  SpO2: 97% 97%    General: Awake alert coherent no distress EOMI NCAT Pleasant is able to tell me the year and place but cannot tell me date or president Cardiovascular: S1-S2 no murmur no rub  no gallop Respiratory: Clinically clear no added sound no rales no rhonchi Abdomen soft no rebound no guarding Neurologically intact no focal deficit  Discharge Instructions   Discharge  Instructions    Diet - low sodium heart healthy   Complete by: As directed    Discharge instructions   Complete by: As directed    Please get follow up with psychology in the OP setting She should get labs in 1-2 weeks  Patient should have reduction of polypharmacy and multiple medications which has been attempted this admission she should also follow-up with her oncologist   Increase activity slowly   Complete by: As directed    No wound care   Complete by: As directed      Allergies as of 07/14/2020      Reactions   Cymbalta [duloxetine Hcl] Other (See Comments)   Hallucinated and couldn't think; got worse and worse the longer she took this.      Medication List    STOP taking these medications   atorvastatin 40 MG tablet Commonly known as: LIPITOR   cyanocobalamin 1000 MCG/ML injection Commonly known as: (VITAMIN B-12)   gabapentin 300 MG capsule Commonly known as: NEURONTIN   ibuprofen 800 MG tablet Commonly known as: ADVIL   losartan 25 MG tablet Commonly known as: COZAAR   OneTouch Ultra test strip Generic drug: glucose blood   traZODone 50 MG tablet Commonly known as: DESYREL     TAKE these medications   ALPRAZolam 0.5 MG tablet Commonly known as: XANAX Take 1 tablet (0.5 mg total) by mouth at bedtime.   aspirin EC 81 MG tablet Take 81 mg by mouth every morning.   calcium carbonate 1500 (600 Ca) MG Tabs tablet Commonly known as: OSCAL Take 600 mg of elemental calcium by mouth daily with supper.   cholecalciferol 1000 units tablet Commonly known as: VITAMIN D Take 1 tablet (1,000 Units total) by mouth daily.   donepezil 5 MG tablet Commonly known as: ARICEPT Take 5 mg by mouth daily.   escitalopram 20 MG tablet Commonly known as: LEXAPRO Take 1 tablet (20 mg total) by mouth daily.   letrozole 2.5 MG tablet Commonly known as: FEMARA TAKE 1 TABLET(2.5 MG) BY MOUTH DAILY   metFORMIN 500 MG tablet Commonly known as: GLUCOPHAGE Take 1 tablet  (500 mg total) by mouth 2 (two) times daily with a meal.   metoprolol succinate 25 MG 24 hr tablet Commonly known as: TOPROL-XL Take 1 tablet (25 mg total) by mouth daily. Please keep upcoming appt in January 2022 before anymore refills. Thank you   oxybutynin 5 MG tablet Commonly known as: DITROPAN Take 5 mg by mouth at bedtime.   risperiDONE 0.5 MG tablet Commonly known as: RISPERDAL Take 1 tablet (0.5 mg total) by mouth at bedtime. What changed:   medication strength  how much to take  how to take this  when to take this  additional instructions   venlafaxine XR 150 MG 24 hr capsule Commonly known as: EFFEXOR-XR Take 1 capsule (150 mg total) by mouth daily with breakfast. What changed:   how much to take  how to take this  when to take this  additional instructions      Allergies  Allergen Reactions  . Cymbalta [Duloxetine Hcl] Other (See Comments)    Hallucinated and couldn't think; got worse and worse the longer she took this.    Follow-up Information    [facility] Follow up.   Why: PT,  RN, Education officer, museum               The results of significant diagnostics from this hospitalization (including imaging, microbiology, ancillary and laboratory) are listed below for reference.    Significant Diagnostic Studies: DG Chest 1 View  Result Date: 07/10/2020 CLINICAL DATA:  84 year old female with altered mental status. EXAM: CHEST  1 VIEW COMPARISON:  Chest radiograph dated 12/11/2019. FINDINGS: Minimal left lung base atelectasis. No focal consolidation, pleural effusion, pneumothorax. Stable cardiac silhouette. Atherosclerotic calcification of the aorta. Loop recorder device. No acute osseous pathology. Partially visualized thoracolumbar fusion hardware. IMPRESSION: No active cardiopulmonary disease. Electronically Signed   By: Anner Crete M.D.   On: 07/10/2020 17:05   CT Head Wo Contrast  Result Date: 07/10/2020 CLINICAL DATA:   Hallucinations, confusion. EXAM: CT HEAD WITHOUT CONTRAST TECHNIQUE: Contiguous axial images were obtained from the base of the skull through the vertex without intravenous contrast. COMPARISON:  Dec 11, 2019. FINDINGS: Brain: Mild chronic ischemic white matter disease is noted. No mass effect or midline shift is noted. Ventricular size is within normal limits. There is no evidence of mass lesion, hemorrhage or acute infarction. Vascular: No hyperdense vessel or unexpected calcification. Skull: Normal. Negative for fracture or focal lesion. Sinuses/Orbits: No acute finding. Other: None. IMPRESSION: Mild chronic ischemic white matter disease. No acute intracranial abnormality seen. Electronically Signed   By: Marijo Conception M.D.   On: 07/10/2020 17:57   MR BRAIN WO CONTRAST  Result Date: 07/11/2020 CLINICAL DATA:  Altered mental status, aphasia EXAM: MRI HEAD WITHOUT CONTRAST TECHNIQUE: Multiplanar, multiecho pulse sequences of the brain and surrounding structures were obtained without intravenous contrast. COMPARISON:  08/17/2019 FINDINGS: Significant motion artifact is present. Several sequences are nondiagnostic. T2 FLAIR sequence was not obtained. There is no acute infarction. No mass effect or hydrocephalus. Probable chronic microvascular ischemic changes. IMPRESSION: Significantly motion degraded.  No acute infarction. Electronically Signed   By: Macy Mis M.D.   On: 07/11/2020 09:51   US Venous Img Lower  Left (DVT Study)  Result Date: 07/10/2020 CLINICAL DATA:  Pain and swelling LEFT leg, altered mental status EXAM: LEFT LOWER EXTREMITY VENOUS DOPPLER ULTRASOUND TECHNIQUE: Gray-scale sonography with compression, as well as color and duplex ultrasound, were performed to evaluate the deep venous system(s) from the level of the common femoral vein through the popliteal and proximal calf veins. COMPARISON:  06/04/2019 Correlation: PET-CT 02/14/2020 FINDINGS: VENOUS Normal compressibility of the  common femoral, superficial femoral, and popliteal veins, as well as the visualized calf veins. Visualized portions of profunda femoral vein and great saphenous vein unremarkable. No filling defects to suggest DVT on grayscale or color Doppler imaging. Doppler waveforms show normal direction of venous flow, normal respiratory plasticity and response to augmentation. Limited views of the contralateral common femoral vein are unremarkable. OTHER Large multiloculated cystic mass in the LEFT inguinal region 10.8 x 4.0 x 5.7 cm in size; patient had multiple enlarged hypermetabolic LEFT inguinal lymph nodes at this site on prior PET-CT exam, chart indicating prior LEFT inguinal lymph node excisions on 03/10/2020 consistent with a postoperative collection. Limitations: None IMPRESSION: No evidence of deep venous thrombosis in the LEFT lower extremity. Large multiloculated fluid collection in the LEFT inguinal region 10.8 x 4.0 x 5.7 cm most likely representing a postoperative collection; this could represent lymphocele, seroma, or old hematoma. Electronically Signed   By: Lavonia Dana M.D.   On: 07/10/2020 17:45    Microbiology: Recent Results (from the past 240  hour(s))  Resp Panel by RT-PCR (Flu A&B, Covid) Nasopharyngeal Swab     Status: None   Collection Time: 07/10/20  9:35 PM   Specimen: Nasopharyngeal Swab; Nasopharyngeal(NP) swabs in vial transport medium  Result Value Ref Range Status   SARS Coronavirus 2 by RT PCR NEGATIVE NEGATIVE Final    Comment: (NOTE) SARS-CoV-2 target nucleic acids are NOT DETECTED.  The SARS-CoV-2 RNA is generally detectable in upper respiratory specimens during the acute phase of infection. The lowest concentration of SARS-CoV-2 viral copies this assay can detect is 138 copies/mL. A negative result does not preclude SARS-Cov-2 infection and should not be used as the sole basis for treatment or other patient management decisions. A negative result may occur with  improper  specimen collection/handling, submission of specimen other than nasopharyngeal swab, presence of viral mutation(s) within the areas targeted by this assay, and inadequate number of viral copies(<138 copies/mL). A negative result must be combined with clinical observations, patient history, and epidemiological information. The expected result is Negative.  Fact Sheet for Patients:  EntrepreneurPulse.com.au  Fact Sheet for Healthcare Providers:  IncredibleEmployment.be  This test is no t yet approved or cleared by the [place] FDA and  has been authorized for detection and/or diagnosis of SARS-CoV-2 by FDA under an Emergency Use Authorization (EUA). This EUA will remain  in effect (meaning this test can be used) for the duration of the COVID-19 declaration under Section 564(b)(1) of the Act, 21 U.S.C.section 360bbb-3(b)(1), unless the authorization is terminated  or revoked sooner.       Influenza A by PCR NEGATIVE NEGATIVE Final   Influenza B by PCR NEGATIVE NEGATIVE Final    Comment: (NOTE) The Xpert Xpress SARS-CoV-2/FLU/RSV plus assay is intended as an aid in the diagnosis of influenza from Nasopharyngeal swab specimens and should not be used as a sole basis for treatment. Nasal washings and aspirates are unacceptable for Xpert Xpress SARS-CoV-2/FLU/RSV testing.  Fact Sheet for Patients: EntrepreneurPulse.com.au  Fact Sheet for Healthcare Providers: IncredibleEmployment.be  This test is not yet approved or cleared by the [place] FDA and has been authorized for detection and/or diagnosis of SARS-CoV-2 by FDA under an Emergency Use Authorization (EUA). This EUA will remain in effect (meaning this test can be used) for the duration of the COVID-19 declaration under Section 564(b)(1) of the Act, 21 U.S.C. section 360bbb-3(b)(1), unless the authorization is terminated or revoked.  Performed at  [facility], [address] [place], [place] [address]   Aerobic Culture (superficial specimen)     Status: None (Preliminary result)   Collection Time: 07/11/20  3:54 PM   Specimen: Wound  Result Value Ref Range Status   Specimen Description WOUND LEFT GROIN  Final   Special Requests ASPIRATION OF FLUID  Final   Gram Stain NO WBC SEEN NO ORGANISMS SEEN   Final   Culture   Final    NO GROWTH 2 DAYS Performed at [facility], [place] [address], [place], [place] [address]    Report Status PENDING  Incomplete     Labs: Basic Metabolic Panel: Recent Labs  Lab 07/10/20 1557 07/13/20 0601 07/14/20 0510  NA 135 138 138  K 3.7 3.6 3.6  CL 97* 100 100  CO2 $Re'27 29 29  'gUN$ GLUCOSE 96 175* 180*  BUN $Re'19 15 15  'SLZ$ CREATININE 0.80 0.83 0.77  CALCIUM 9.8 9.3 9.4   Liver Function Tests: Recent Labs  Lab 07/10/20 1557 07/13/20 0601 07/14/20 0510  AST 20 14* 15  ALT '12 11 11  '$ ALKPHOS 47 45 42  BILITOT 0.8 0.7 0.6  PROT 6.6 6.2* 6.2*  ALBUMIN 4.1 3.6 3.6   No results for input(s): LIPASE, AMYLASE in the last 168 hours. No results for input(s): AMMONIA in the last 168 hours. CBC: Recent Labs  Lab 07/10/20 1557 07/13/20 0601 07/14/20 0510  WBC 8.5 7.2 5.7  NEUTROABS 6.4 5.3 4.1  HGB 12.5 13.6 13.0  HCT 37.9 41.4 39.6  MCV 90.7 91.8 91.9  PLT 239 230 241   Cardiac Enzymes: No results for input(s): CKTOTAL, CKMB, CKMBINDEX, TROPONINI in the last 168 hours. BNP: BNP (last 3 results) No results for input(s): BNP in the last 8760 hours.  ProBNP (last 3 results) No results for input(s): PROBNP in the last 8760 hours.  CBG: Recent Labs  Lab 07/13/20 1145 07/13/20 1606 07/13/20 2121 07/14/20 0746 07/14/20 0755  GLUCAP 285* 241* 164* 173* 183*       Signed:  Nita Sells MD   [facility] 07/14/2020, 8:34 AM

## 2020-07-14 NOTE — Progress Notes (Signed)
Pt Iv was removed, Clean, dry and intact, Discharge paper work was given to son and went over. Prescription was giving to son to take to the pharmacy. Pt wheeled down to main entrance with all belongings.

## 2020-07-14 NOTE — Progress Notes (Signed)
Physical Therapy Treatment Patient Details Name: Amy [name] MRN: [id number] DOB: 25-Dec-1935 Today's Date: 07/14/2020    History of Present Illness Amy [name] is a 84 y.o. female with medical history significant for systolic CHF with EF of 30 to 35%, cryptogenic stroke, diabetes mellitus, cognitive impairments.  Patient was brought to the ED with complaints of paranoia, hallucinations, confusion, difficulty speaking, wandering.  Patient had a follow-up visit with telepsych today, per notes patient was very confused, patient wandered out of her house with her dog a couple of nights ago, and was seen by neighbor and returned home.  This morning she was trying to leave the house again.  She is also having a lot of word finding difficulty.  On my evaluation patient is awake alert able to give me some history.  Initially her speech was moderately fluent when talking to me, but gradually she started having progressive difficulty getting her words out.  She tells me she has had a stroke before, but her speech is worse today.     Patient was treated for urinary tract infection 12/1 ED visit,  She had urinary symptoms, she was prescribed a course of cephalexin. Subsequent urine cultures showed sensitivity to cephalosporins resistance to ampicillin, ampicillin/sulbactam.  Today patient denies dysuria, she denies chest pain or difficulty breathing or pain anywhere.    PT Comments    Patient present in chair after finishing breakfast and agreed to ambulate with PT. Completed brief therapeutic activities detailed below to ensure appropriate movement. Able to transition with supervision to [facility] during sit to stand and stand pivot transfers with appropriate safety measures. Ambulate with RW without LOB, limited by slight fatigue. Patient returned to chair with family in room. Patient will benefit from continued physical therapy in hospital and recommended venue below to increase strength, balance,  endurance for safe ADLs and gait.   Follow Up Recommendations  Home health PT;Supervision - Intermittent;Supervision for mobility/OOB     Equipment Recommendations  None recommended by PT    Recommendations for Other Services       Precautions / Restrictions Precautions Precautions: Fall Restrictions Weight Bearing Restrictions: No    Mobility  Bed Mobility                  Transfers Overall transfer level: Needs assistance Equipment used: Rolling walker (2 wheeled) Transfers: Sit to/from Stand;Stand Pivot Transfers Sit to Stand: Min guard Stand pivot transfers: Min guard       General transfer comment: with RW, decreased eccentric control descending  Ambulation/Gait Ambulation/Gait assistance: Min guard Gait Distance (Feet): 50 Feet Assistive device: Rolling walker (2 wheeled) Gait Pattern/deviations: Step-through pattern;Decreased stride length;Decreased step length - right;Decreased step length - left;Shuffle Gait velocity: decreased   General Gait Details: easily fatigued, slow movements   Stairs             Wheelchair Mobility    Modified Rankin (Stroke Patients Only)       Balance Overall balance assessment: Needs assistance Sitting-balance support: Bilateral upper extremity supported Sitting balance-Leahy Scale: Fair Sitting balance - Comments: seated in chair   Standing balance support: During functional activity Standing balance-Leahy Scale: Fair Standing balance comment: with RW                            Cognition Arousal/Alertness: Awake/alert Behavior During Therapy: WFL for tasks assessed/performed Overall Cognitive Status: Within Functional Limits for tasks assessed  Exercises General Exercises - Lower Extremity Ankle Circles/Pumps: AROM;Strengthening;Both;10 reps;Seated Hip Flexion/Marching: AROM;Strengthening;Both;10 reps    General Comments         Pertinent Vitals/Pain Pain Assessment: No/denies pain    Home Living                      Prior Function            PT Goals (current goals can now be found in the care plan section) Acute Rehab PT Goals Patient Stated Goal: return home PT Goal Formulation: With patient Time For Goal Achievement: 07/25/20 Potential to Achieve Goals: Good Progress towards PT goals: Progressing toward goals    Frequency    Min 3X/week      PT Plan Current plan remains appropriate    Co-evaluation              AM-PAC PT "6 Clicks" Mobility   Outcome Measure  Help needed turning from your back to your side while in a flat bed without using bedrails?: A Little Help needed moving from lying on your back to sitting on the side of a flat bed without using bedrails?: A Little   Help needed standing up from a chair using your arms (e.g., wheelchair or bedside chair)?: A Little Help needed to walk in hospital room?: A Little Help needed climbing 3-5 steps with a railing? : A Lot 6 Click Score: 14    End of Session   Activity Tolerance: Patient limited by fatigue Patient left: in bed;with call bell/phone within reach Nurse Communication: Mobility status PT Visit Diagnosis: Unsteadiness on feet (R26.81);Other abnormalities of gait and mobility (R26.89);Repeated falls (R29.6);Muscle weakness (generalized) (M62.81)     Time: 3202-3343 PT Time Calculation (min) (ACUTE ONLY): 10 min  Charges:  $Gait Training: 8-22 mins                     9:38 AM,07/14/20 Domenic Moras, PT, DPT Physical Therapist at [facility]

## 2020-07-14 NOTE — TOC Transition Note (Signed)
Transition of Care [facility]) - CM/SW Discharge Note  Patient Details  Name: Amy [name] MRN: [id number] Date of Birth: 08/11/1935  Transition of Care [facility]) CM/SW Contact:  Sherie Don, LCSW Phone Number: 07/14/2020, 10:57 AM  Clinical Narrative: Patient ready for discharge. HH orders have been place. Sarah with [facility] updated. TOC signing off.  Final next level of care: [place] Barriers to Discharge: Barriers Resolved  Patient Goals and CMS Choice Patient states their goals for this hospitalization and ongoing recovery are:: Discharge with [facility]; continue to pursue ALF placement CMS Medicare.gov Compare Post Acute Care list provided to:: Patient Represenative (must comment) Choice offered to / list presented to : Adult Children  Discharge Plan and Services In-house Referral: Clinical Social Work Post Acute Care Choice: Home Health          DME Arranged: N/A DME Agency: NA HH Arranged: RN,PT,Speech Therapy,Social Work [employer] Agency: [place] Date [place]: 07/14/20 Time [place]: [phone] Representative spoke with at [place]: [place]  Readmission Risk Interventions Readmission Risk Prevention Plan 08/19/2019  Transportation Screening Complete  PCP or Specialist Appt within 3-5 Days Not Complete  HRI or [place] Complete  Social Work Consult for [place] Planning/Counseling [place] Not Complete  Medication Review Press photographer) Complete  Some recent data might be hidden

## 2020-07-17 ENCOUNTER — Telehealth: Payer: Self-pay | Admitting: Emergency Medicine

## 2020-07-17 LAB — CUP PACEART REMOTE DEVICE CHECK
Date Time Interrogation Session: 20211218015330
Implantable Pulse Generator Implant Date: 20210325

## 2020-07-17 NOTE — Telephone Encounter (Signed)
Agree with plan. Can consider virtual video visit if more convenient for family

## 2020-07-17 NOTE — Telephone Encounter (Signed)
Spoke with son and he would like to coordinate visit with Enedina Finner PA on the date of visit with Kathlen Mody PA . Patient has difficulty riding in the car and attending provider appointments.Son made aware that will try to schedule appointments on same date if possible.

## 2020-07-17 NOTE — Telephone Encounter (Signed)
The pt son called to get help sending a manual transmission. Transmission received. I told him the nurse will call him back.

## 2020-07-17 NOTE — Telephone Encounter (Signed)
afib observed on ILR.  Longest episode was 1 hour and 38 minutes. Patient has had confusion and recent hospitalization for AMS.  There is concern that she may not be a candidate for [place] due to this.  We will bring in to the office to see EP PA. Hopefully her son will come with her for further discussions of possible [place] therapy.  I will alert neurology also.  Thompson Grayer MD, [place] 07/17/2020 4:11 PM

## 2020-07-17 NOTE — Telephone Encounter (Addendum)
Patient confused asked that I speak to home health nurse Janett Billow. Janett Billow asked patient to explain remote transmission and verbalized she did not understand process of remote transmission and loop recorder.  Contacted son Jenny Reichmann who is on [place] and he will be going to visit his mother today and will send remote transmission . Device clinic # provided for assistance.  Patient has LINQ  And need forced transmission to view all episodes of possible AF.

## 2020-07-25 ENCOUNTER — Telehealth (HOSPITAL_COMMUNITY): Payer: Self-pay

## 2020-07-25 NOTE — Telephone Encounter (Signed)
Medication problem - patient's son left a message that pt was recently in the [facility] ED where they made some medication changes to her psychiatric medications and now pt is experiencing more anxiety and agitation.  Collateral requests Dr. Tenny Craw review the recent medication changes and advise if patient should continue with lower dosage of Risperdal, no Trazodone and changes to Gabapentin.  Collateral requests Dr. Tenny Craw contact them back with recommendations due to pt's increased anxiety.  No current appt.scheduled.

## 2020-07-27 NOTE — Progress Notes (Signed)
[facility] Summary Report / Loop Recorder 

## 2020-07-31 NOTE — Progress Notes (Signed)
Virtual Visit via Video Note   This visit type was conducted due to national recommendations for restrictions regarding the COVID-19 Pandemic (e.g. social distancing) in an effort to limit this patient's exposure and mitigate transmission in our community.  Due to her co-morbid illnesses, this patient is at least at moderate risk for complications without adequate follow up.  This format is felt to be most appropriate for this patient at this time.  All issues noted in this document were discussed and addressed.  A limited physical exam was performed with this format.  Please refer to the patient's chart for her consent to telehealth for [facility].  Video Connection Lost Video connection was lost at < 50% of the duration of this visit, at which time the remainder of the visit was completed via audio only.     Date:  08/01/2020   ID:  Ulla Gallo, DOB [birth year], MRN [id number] The patient was identified using 2 identifiers.  Patient Location: Home Provider Location: Office/Clinic  PCP:  Pearson Grippe, MD  Cardiologist:  Dietrich Pates, MD   Electrophysiologist:  Hillis Range, MD   Evaluation Performed:  Follow-Up Visit  Chief Complaint:  Follow-up (CHF)    Patient Profile: JENYA PUTZ is a 85 y.o. female with:  Heart failure with reduced ejection fraction   Echo 02/2019: EF 35-40  Echo 3/21: EF 35-40  Myoview 4/21: EF 48, no ischemia  Breast CAs/p lumpectomy   Diabetes mellitus   Hypertension   Hx of cryptogenic CVA   S/p ILR  Paroxysmal atrial fibrillation  AF burden 0.3% (07/15/20 ILR interrogation)  Anxiety/Depression   S/p thoracolumbar fusion 2019    Dementia   Prior CV Studies: Myoview 11/09/19 EF 48, apical thinning, no ischemia, low risk  Echocardiogram 09/27/19 EF 35-40, global HK, Gr 1 DD, normal RVSF, mild MR, trivial AI  Event monitor 06/2019 SInus rhythm, sinus tachycardia   Rates 61 to 131 bpm    Occasional PVC Symptoms of fatigue  did not correlate with any signif arrhythmia or extreme heart rates  TEE 03/23/2019 EF 30-35, no RWMA, small effusion, small Patent Foramen Ovale,   Echocardiogram 03/21/2019 EF 35-40   History of Present Illness:   Ms. Foskey saw Dr. Tenny Craw in February 2021 and Dr. Johney Frame in March 2021.  Cardiomyopathy was diagnosed in August 2020 when she presented with a stroke.  She has dementia and invasive evaluation for ischemia has been avoided.  She did have a Myoview in April 2021 that demonstrated no ischemia.  She has been managed medically.  She was admitted in 06/2020 with metabolic encephalopathy due to polypharmacy.  ILR interrogation last month demonstrated atrial fibrillation (longest episode 1 hour 38 minutes).  There is some concern that the patient may not be a candidate for anticoagulation.  She has an appointment next week with Francis Dowse, PA-C with EP to discuss candidacy for anticoagulation.  Review of the chart also indicates that follow-up with neurology is being coordinated.  Notes indicate she is being referred back by her PCP to cardiology for evaluation of leg edema.  Her son, Christiane Ha, helped with the phone call.  DPR on file.  She has a long hx of leg edema.  It seems to be stable.  She has not had orthopnea, paroxysmal nocturnal dyspnea, chest pain, syncope.  She has not had significant shortness of breath.  She is fairly sedentary.  She lives alone.  She has a sitter 5 days a week for 5 hours.  She has neighbors and her son who check on her several times a day.  She has no sitter on Tues and Sun.  She has fallen 2-3 times in the last 6 mos.  She has not had significant injuries.  She has a pill dispenser on a timer.  So, she is able to take her medications regularly.  Her Losartan, Atorvastatin were DC'd during her recent admission due to memory issues and low BP.  Her BP has been higher lately.  Her son will send BP readings via MyChart for review.  If possible, I will resume  Losartan depending on her BP readings.    Past Medical History:  Diagnosis Date  . Anxiety   . Arthritis   . Back pain   . Colon polyps   . Dementia ([place])    early signs per daughter in law  . Depression   . Diabetes mellitus, type II ([place])    diet controlled  . Hypercholesterolemia   . Invasive ductal carcinoma of breast, female, right ([place])   . Osteopenia 11/26/2016  . Skin-picking disorder    has 3 open wounds on right wrist that are being treated. About a quarter in size.  . Stroke [facility])    Past Surgical History:  Procedure Laterality Date  . APPENDECTOMY    . BACK SURGERY    . BREAST BIOPSY Right 10/09/2016   Procedure: BREAST BIOPSY WITH NEEDLE LOCALIZATION;  Surgeon: [name], MD;  Location: [facility] ORS;  Service: General;  Laterality: Right;  . BREAST SURGERY Right   . BUBBLE STUDY  03/23/2019   Procedure: BUBBLE STUDY;  Surgeon: [name], MD;  Location: [facility] ENDOSCOPY;  Service: Cardiovascular;;  . BUNIONECTOMY Bilateral   . FACIAL COSMETIC SURGERY     drooping eyelid and brow  . HERNIA REPAIR Right    Inguinal x2  . implantable loop recorder placement  10/21/2019    Medtronic Reveal [place] model LNQ 11 [id number]) implanted by Dr [name] for cryptogenic stroke indication  . INGUINAL LYMPH NODE BIOPSY Left 03/10/2020   Procedure: LEFT INGUINAL LYMPH NODE BIOPSY;  Surgeon: [name], MD;  Location: [facility] ORS;  Service: General;  Laterality: Left;  . KNEE ARTHROSCOPY Right   . SHOULDER SURGERY Right    rotator cuff repair and removal of bone spur  . TEE WITHOUT CARDIOVERSION N/A 03/23/2019   Procedure: TRANSESOPHAGEAL ECHOCARDIOGRAM (TEE);  Surgeon: [name], MD;  Location: [facility] ENDOSCOPY;  Service: Cardiovascular;  Laterality: N/A;  . TEMPOROMANDIBULAR JOINT SURGERY Right   . TOTAL KNEE ARTHROPLASTY Right 05/05/2018   Procedure: TOTAL KNEE ARTHROPLASTY;  Surgeon: [name], MD;  Location: [facility] ORS;  Service: Orthopedics;  Laterality: Right;      Current Meds  Medication Sig  . ALPRAZolam (XANAX) 0.5 MG tablet Take 1 tablet (0.5 mg total) by mouth at bedtime.  Marland Kitchen aspirin EC 81 MG tablet Take 81 mg by mouth every morning.  . calcium carbonate (OSCAL) 1500 (600 Ca) MG TABS tablet Take 600 mg of elemental calcium by mouth daily with supper.   . cholecalciferol (VITAMIN D) 1000 units tablet Take 1 tablet (1,000 Units total) by mouth daily.  Marland Kitchen donepezil (ARICEPT) 5 MG tablet Take 5 mg by mouth daily.  Marland Kitchen escitalopram (LEXAPRO) 20 MG tablet Take 1 tablet (20 mg total) by mouth daily.  Derrill Memo ON 08/02/2020] furosemide (LASIX) 20 MG tablet Take 1 tablet (20 mg total) by mouth 3 (three) times a week.  . gabapentin (NEURONTIN) 100  MG capsule Take 1 capsule (100 mg total) by mouth at bedtime.  Marland Kitchen letrozole (FEMARA) 2.5 MG tablet TAKE 1 TABLET(2.5 MG) BY MOUTH DAILY  . metFORMIN (GLUCOPHAGE) 500 MG tablet Take 1 tablet (500 mg total) by mouth 2 (two) times daily with a meal.  . metoprolol succinate (TOPROL-XL) 25 MG 24 hr tablet Take 1 tablet (25 mg total) by mouth daily. Please keep upcoming appt in January 2022 before anymore refills. Thank you  . oxybutynin (DITROPAN) 5 MG tablet Take 5 mg by mouth at bedtime.  . risperiDONE (RISPERDAL) 0.5 MG tablet Take 1 tablet (0.5 mg total) by mouth at bedtime.  Marland Kitchen venlafaxine XR (EFFEXOR-XR) 150 MG 24 hr capsule Take 1 capsule (150 mg total) by mouth daily with breakfast.     Allergies:   Cymbalta [duloxetine hcl]   Social History   Tobacco Use  . Smoking status: Former Smoker    Packs/day: 0.50    Years: 40.00    Pack years: 20.00    Types: Cigarettes    Quit date: 09/04/1995    Years since quitting: 24.9  . Smokeless tobacco: Never Used  Vaping Use  . Vaping Use: Never used  Substance Use Topics  . Alcohol use: No  . Drug use: No    Types: Benzodiazepines, Hydrocodone     Family Hx: The patient's family history includes Alcohol abuse in her brother; Anxiety disorder in her mother; Cancer  - Other in her sister; Depression in her maternal grandmother and mother. There is no history of ADD / ADHD, Bipolar disorder, Dementia, Drug abuse, OCD, Paranoid behavior, Schizophrenia, Seizures, Sexual abuse, or Physical abuse.  ROS:   Please see the history of present illness.    No hx of prior internal bleeding or blood transfusions. She has not had recent melena, hematochezia, hematuria.   Labs/Other Tests and Data Reviewed:    EKG:  No ECG reviewed.  Recent Labs: 10/07/2019: Magnesium 1.9; TSH 1.238 07/14/2020: ALT 11; BUN 15; Creatinine, Ser 0.77; Hemoglobin 13.0; Platelets 241; Potassium 3.6; Sodium 138   Recent Lipid Panel Lab Results  Component Value Date/Time   CHOL 208 (H) 07/11/2020 04:01 AM   CHOL 172 05/06/2019 02:51 PM   TRIG 120 07/11/2020 04:01 AM   HDL 74 07/11/2020 04:01 AM   HDL 83 05/06/2019 02:51 PM   CHOLHDL 2.8 07/11/2020 04:01 AM   LDLCALC 110 (H) 07/11/2020 04:01 AM   LDLCALC 57 05/06/2019 02:51 PM    Wt Readings from Last 3 Encounters:  08/01/20 158 lb 9.6 oz (71.9 kg)  07/11/20 152 lb 12.5 oz (69.3 kg)  03/16/20 164 lb 6.4 oz (74.6 kg)     Risk Assessment/Calculations:     CHA2DS2-VASc Score = 8  This indicates a 10.8% annual risk of stroke. The patient's score is based upon: CHF History: Yes HTN History: Yes Diabetes History: Yes Stroke History: Yes Vascular Disease History: No Age Score: 2 Gender Score: 1     Objective:    Vital Signs:  Wt 158 lb 9.6 oz (71.9 kg)   BMI 25.60 kg/m    VITAL SIGNS:  reviewed GEN:  no acute distress PSYCH:  normal affect  ASSESSMENT & PLAN:    1. HFrEF (heart failure with reduced ejection fraction) (HCC) EF ~40%.  Myoview in 4/21 without ischemia.  She is no longer on angiotensin receptor blocker due to low BP.  Her carvedilol was changed to Metoprolol succinate in the last year, but I cannot locate in her chart  why she had this change.  She has chronic leg edema.  Her weights are stable.   Evaluation is limited given her virtual visit today.  Due to her issues with falling and no significant worsening of symptoms, I will give her a very low dose of Furosemide to start.  We can try to get her in for an in person visit in the next 6 weeks to adjust further if needed.  If her BP is high enough, I will try to add Losartan back.    -Continue current dose of beta-blocker  -Start Furosemide 20 mg once daily on Mon, Wed, Fri only.  -BMET, BNP in 2 weeks  -F/u with Dr. [name] in [place] if possible in the next 6 weeks  -Will try to resume Losartan if BP high enough  2. Cryptogenic stroke ([place]) F/u with neuro as planned.   3. Paroxysmal atrial fibrillation (HCC) CHA2DS2-VASc Score = 8 [CHF History: Yes, HTN History: Yes, Diabetes History: Yes, Stroke History: Yes, Vascular Disease History: No, Age Score: 2, Gender Score: 1].  Therefore, the patient's annual risk of stroke is 10.8 %.   I had a long d/w her son today and he is comfortable with her taking anticoagulation.  This is my first meeting with her, but I think she is a fair candidate for anticoagulation.  I am concerned about her dementia, living alone and hx of falling.  She has a Actuary fo 5 days a week for 5 hours.  She is to see neuro soon.  I will review further with her primary cardiologist, Dr. [name] so we can make a decision regarding anticoagulation.  Her weight is > 60 kg and her SCr is < 1.5.  She would need Apixaban 5 mg twice daily (and stop ASA) if we decide to place her on anticoagulation.      Time:   Today, I have spent 19 minutes with the patient with telehealth technology discussing the above problems.     Medication Adjustments/Labs and Tests Ordered: Current medicines are reviewed at length with the patient today.  Concerns regarding medicines are outlined above.   Tests Ordered: Orders Placed This Encounter  Procedures  . Basic metabolic panel  . Pro b natriuretic peptide (BNP)    Medication Changes: Meds  ordered this encounter  Medications  . furosemide (LASIX) 20 MG tablet    Sig: Take 1 tablet (20 mg total) by mouth 3 (three) times a week.    Dispense:  45 tablet    Refill:  3    Follow Up:  In Person in 6 week(s)  Signed, Richardson Dopp, PA-C  08/01/2020 5:20 PM    [facility]

## 2020-08-01 ENCOUNTER — Encounter: Payer: Self-pay | Admitting: Physician Assistant

## 2020-08-01 ENCOUNTER — Telehealth (INDEPENDENT_AMBULATORY_CARE_PROVIDER_SITE_OTHER): Payer: Medicare HMO | Admitting: Physician Assistant

## 2020-08-01 ENCOUNTER — Other Ambulatory Visit: Payer: Self-pay

## 2020-08-01 VITALS — Wt 158.6 lb

## 2020-08-01 DIAGNOSIS — I5020 Unspecified systolic (congestive) heart failure: Secondary | ICD-10-CM

## 2020-08-01 DIAGNOSIS — I639 Cerebral infarction, unspecified: Secondary | ICD-10-CM

## 2020-08-01 DIAGNOSIS — I480 Paroxysmal atrial fibrillation: Secondary | ICD-10-CM

## 2020-08-01 MED ORDER — FUROSEMIDE 20 MG PO TABS: 20.0000 mg | ORAL_TABLET | ORAL | 3 refills | Status: DC

## 2020-08-01 NOTE — Patient Instructions (Signed)
Medication Instructions:  Your physician has recommended you make the following change in your medication:   1) Start Furosemide 20 mg, 1 tablet by mouth three times a week on Monday, Wednesday, Friday  *If you need a refill on your cardiac medications before your next appointment, please call your pharmacy*  Lab Work: Your physician recommends that you return for lab work in 2 weeks. You may go to the [facility] in [place].  Testing/Procedures: None ordered today  Follow-Up: At [facility], you and your health needs are our priority.  As part of our continuing mission to provide you with exceptional heart care, we have created designated Provider Care Teams.  These Care Teams include your primary Cardiologist (physician) and Advanced Practice Providers (APPs -  Physician Assistants and Nurse Practitioners) who all work together to provide you with the care you need, when you need it.  Your next appointment:   6 week(s)  The format for your next appointment:   In Person  Provider:   You may see Dietrich Pates, MD or one of the following Advanced Practice Providers on your designated Care Team:    Randall An, PA-C   Jacolyn Reedy, PA-C   Other Instructions Wear compression stockings if possible

## 2020-08-01 NOTE — Progress Notes (Deleted)
Cardiology Office Note Date:  08/01/2020  Patient ID:  Amy [name], Amy [name] 1936-07-23, MRN [id number] PCP:  Pearson Grippe, MD  Cardiologist:  Dr. Tenny Craw Electrophysiologist: Dr. Johney Frame  ***refresh   Chief Complaint: ***  History of Present Illness: Amy [name] is a 85 y.o. female with history of DM, HLD, breast CA (s/p lumpectomy) stroke, NICM, chronic CHF (systolic), dementia   She comes in today to be seen for Dr. Johney Frame, last seen by him in march 2021 for her loop implant.  Subsequently/most recently her loop detected AFib, Dr. Johney Frame noted 07/17/20 that the pt has had some hospitalization with confusion and worried this may make her unfavorable OAC candidate, he  recommended she come in with her son for discussion and evaluation for possible a/c therapy.  He was also going to let her neurology team know of the finding.  She saw Wende Mott, PA 08/01/20 ***   *** symptoms *** AMS, falls, bleeding, contraindications? *** symptoms *** HASBLED   Device information MDT ILR, implanted 10/21/19 2/2 cryptogenic stroke   Past Medical History:  Diagnosis Date  . Anxiety   . Arthritis   . Back pain   . Colon polyps   . Dementia (HCC)    early signs per daughter in law  . Depression   . Diabetes mellitus, type II (HCC)    diet controlled  . Hypercholesterolemia   . Invasive ductal carcinoma of breast, female, right (HCC)   . Osteopenia 11/26/2016  . Skin-picking disorder    has 3 open wounds on right wrist that are being treated. About a quarter in size.  . Stroke [facility])     Past Surgical History:  Procedure Laterality Date  . APPENDECTOMY    . BACK SURGERY    . BREAST BIOPSY Right 10/09/2016   Procedure: BREAST BIOPSY WITH NEEDLE LOCALIZATION;  Surgeon: Franky Macho, MD;  Location: [facility] ORS;  Service: General;  Laterality: Right;  . BREAST SURGERY Right   . BUBBLE STUDY  03/23/2019   Procedure: BUBBLE STUDY;  Surgeon: Dolores Patty, MD;  Location: [facility] ENDOSCOPY;   Service: Cardiovascular;;  . BUNIONECTOMY Bilateral   . FACIAL COSMETIC SURGERY     drooping eyelid and brow  . HERNIA REPAIR Right    Inguinal x2  . implantable loop recorder placement  10/21/2019    Medtronic Reveal [place] model LNQ 11 [id number]) implanted by Dr Johney Frame for cryptogenic stroke indication  . INGUINAL LYMPH NODE BIOPSY Left 03/10/2020   Procedure: LEFT INGUINAL LYMPH NODE BIOPSY;  Surgeon: Franky Macho, MD;  Location: [facility] ORS;  Service: General;  Laterality: Left;  . KNEE ARTHROSCOPY Right   . SHOULDER SURGERY Right    rotator cuff repair and removal of bone spur  . TEE WITHOUT CARDIOVERSION N/A 03/23/2019   Procedure: TRANSESOPHAGEAL ECHOCARDIOGRAM (TEE);  Surgeon: Dolores Patty, MD;  Location: [facility] ENDOSCOPY;  Service: Cardiovascular;  Laterality: N/A;  . TEMPOROMANDIBULAR JOINT SURGERY Right   . TOTAL KNEE ARTHROPLASTY Right 05/05/2018   Procedure: TOTAL KNEE ARTHROPLASTY;  Surgeon: Vickki Hearing, MD;  Location: [facility] ORS;  Service: Orthopedics;  Laterality: Right;    Current Outpatient Medications  Medication Sig Dispense Refill  . ALPRAZolam (XANAX) 0.5 MG tablet Take 1 tablet (0.5 mg total) by mouth at bedtime. 30 tablet 2  . aspirin EC 81 MG tablet Take 81 mg by mouth every morning.    . calcium carbonate (OSCAL) 1500 (600 Ca) MG TABS tablet Take 600 mg of elemental  calcium by mouth daily with supper.     . cholecalciferol (VITAMIN D) 1000 units tablet Take 1 tablet (1,000 Units total) by mouth daily. 30 tablet 11  . donepezil (ARICEPT) 5 MG tablet Take 5 mg by mouth daily.    Marland Kitchen escitalopram (LEXAPRO) 20 MG tablet Take 1 tablet (20 mg total) by mouth daily. 30 tablet 2  . gabapentin (NEURONTIN) 100 MG capsule Take 1 capsule (100 mg total) by mouth at bedtime. 30 capsule 2  . letrozole (FEMARA) 2.5 MG tablet TAKE 1 TABLET(2.5 MG) BY MOUTH DAILY 90 tablet 3  . metFORMIN (GLUCOPHAGE) 500 MG tablet Take 1 tablet (500 mg total) by mouth 2 (two) times daily with a  meal. 60 tablet 0  . metoprolol succinate (TOPROL-XL) 25 MG 24 hr tablet Take 1 tablet (25 mg total) by mouth daily. Please keep upcoming appt in January 2022 before anymore refills. Thank you 30 tablet 1  . oxybutynin (DITROPAN) 5 MG tablet Take 5 mg by mouth at bedtime.    . risperiDONE (RISPERDAL) 0.5 MG tablet Take 1 tablet (0.5 mg total) by mouth at bedtime.    Marland Kitchen venlafaxine XR (EFFEXOR-XR) 150 MG 24 hr capsule Take 1 capsule (150 mg total) by mouth daily with breakfast. 30 capsule    No current facility-administered medications for this visit.    Allergies:   Cymbalta [duloxetine hcl]   Social History:  The patient  reports that she quit smoking about 24 years ago. Her smoking use included cigarettes. She has a 20.00 pack-year smoking history. She has never used smokeless tobacco. She reports that she does not drink alcohol and does not use drugs.   Family History:  The patient's family history includes Alcohol abuse in her brother; Anxiety disorder in her mother; Cancer - Other in her sister; Depression in her maternal grandmother and mother.  ROS:  Please see the history of present illness.    All other systems are reviewed and otherwise negative.   PHYSICAL EXAM:  VS:  There were no vitals taken for this visit. BMI: There is no height or weight on file to calculate BMI. Well nourished, well developed, in no acute distress HEENT: normocephalic, atraumatic Neck: no JVD, carotid bruits or masses Cardiac:  *** RRR; no significant murmurs, no rubs, or gallops Lungs:  *** CTA b/l, no wheezing, rhonchi or rales Abd: soft, nontender MS: no deformity or *** atrophy Ext: *** no edema Skin: warm and dry, no rash Neuro:  No gross deficits appreciated Psych: euthymic mood, full affect  *** ILR site is stable, no tethering or discomfort   EKG:  Not done today  Device interrogation done today and reviewed by myself:  ***  11/09/19: stress myoview  The left ventricular ejection  fraction is mildly decreased (45-54%).  Nuclear stress EF: 48%.  There was no ST segment deviation noted during stress.  This is a low risk study.   Apical thinning not thought to be significant No ischemia EF estimated at 48% with apical hypokinesis suggest MRI/Echo correlation of EF    09/27/2019: TTE IMPRESSIONS  1. Left ventricular ejection fraction, by estimation, is 35 to 40%. The  left ventricle has moderately decreased function. The left ventricle  demonstrates global hypokinesis. Left ventricular diastolic parameters are  consistent with Grade I diastolic  dysfunction (impaired relaxation).  2. Right ventricular systolic function is normal. The right ventricular  size is normal. Tricuspid regurgitation signal is inadequate for assessing  PA pressure.  3. The mitral  valve is grossly normal with mild leaflet thickening. Mild  mitral valve regurgitation.  4. The aortic valve is tricuspid. Aortic valve regurgitation is trivial.  5. The inferior vena cava is normal in size with greater than 50%  respiratory variability, suggesting right atrial pressure of 3 mmHg.  6. Interatrial shunting not evident baed on limited images, although PFO  previously reported by TEE August 2020.    Recent Labs: 10/07/2019: Magnesium 1.9; TSH 1.238 07/14/2020: ALT 11; BUN 15; Creatinine, Ser 0.77; Hemoglobin 13.0; Platelets 241; Potassium 3.6; Sodium 138  07/11/2020: Cholesterol 208; HDL 74; LDL Cholesterol 110; Total CHOL/HDL Ratio 2.8; Triglycerides 120; VLDL 24   CrCl cannot be calculated (Unknown ideal weight.).   Wt Readings from Last 3 Encounters:  07/11/20 152 lb 12.5 oz (69.3 kg)  03/16/20 164 lb 6.4 oz (74.6 kg)  03/08/20 159 lb (72.1 kg)     Other studies reviewed: Additional studies/records reviewed today include: summarized above  ASSESSMENT AND PLAN:  1. Cryptogenic stroke 2. Newly discovered AFib via loop     CHA2DS2Vasc is 7     ***  3. NICM 4. Chronic CHF  (systolic)     ***       Disposition: F/u with ***  Current medicines are reviewed at length with the patient today.  The patient did not have any concerns regarding medicines.  Venetia Night, PA-C 08/01/2020 11:41 AM     [facility] [name] [address] [phone] (office)  [phone] (fax)

## 2020-08-03 ENCOUNTER — Other Ambulatory Visit: Payer: Self-pay

## 2020-08-03 ENCOUNTER — Inpatient Hospital Stay (HOSPITAL_BASED_OUTPATIENT_CLINIC_OR_DEPARTMENT_OTHER): Payer: Medicare HMO | Admitting: Hematology

## 2020-08-03 ENCOUNTER — Inpatient Hospital Stay (HOSPITAL_COMMUNITY): Payer: Medicare HMO | Attending: Hematology

## 2020-08-03 ENCOUNTER — Inpatient Hospital Stay (HOSPITAL_COMMUNITY): Payer: Medicare HMO

## 2020-08-03 ENCOUNTER — Other Ambulatory Visit (HOSPITAL_COMMUNITY): Payer: Self-pay | Admitting: Hematology

## 2020-08-03 VITALS — BP 145/81 | HR 76 | Temp 98.2°F | Resp 17 | Wt 157.7 lb

## 2020-08-03 DIAGNOSIS — M7989 Other specified soft tissue disorders: Secondary | ICD-10-CM | POA: Insufficient documentation

## 2020-08-03 DIAGNOSIS — Z79811 Long term (current) use of aromatase inhibitors: Secondary | ICD-10-CM | POA: Diagnosis not present

## 2020-08-03 DIAGNOSIS — Z170 Estrogen receptor positive status [ER+]: Secondary | ICD-10-CM | POA: Diagnosis not present

## 2020-08-03 DIAGNOSIS — Z79899 Other long term (current) drug therapy: Secondary | ICD-10-CM | POA: Insufficient documentation

## 2020-08-03 DIAGNOSIS — I700 Atherosclerosis of aorta: Secondary | ICD-10-CM | POA: Insufficient documentation

## 2020-08-03 DIAGNOSIS — C50911 Malignant neoplasm of unspecified site of right female breast: Secondary | ICD-10-CM

## 2020-08-03 DIAGNOSIS — C8238 Follicular lymphoma grade IIIa, lymph nodes of multiple sites: Secondary | ICD-10-CM | POA: Insufficient documentation

## 2020-08-03 DIAGNOSIS — R590 Localized enlarged lymph nodes: Secondary | ICD-10-CM

## 2020-08-03 DIAGNOSIS — M8580 Other specified disorders of bone density and structure, unspecified site: Secondary | ICD-10-CM | POA: Diagnosis not present

## 2020-08-03 DIAGNOSIS — M549 Dorsalgia, unspecified: Secondary | ICD-10-CM | POA: Insufficient documentation

## 2020-08-03 DIAGNOSIS — M818 Other osteoporosis without current pathological fracture: Secondary | ICD-10-CM

## 2020-08-03 DIAGNOSIS — K449 Diaphragmatic hernia without obstruction or gangrene: Secondary | ICD-10-CM | POA: Diagnosis not present

## 2020-08-03 DIAGNOSIS — F419 Anxiety disorder, unspecified: Secondary | ICD-10-CM | POA: Insufficient documentation

## 2020-08-03 LAB — CBC WITH DIFFERENTIAL/PLATELET
Abs Immature Granulocytes: 0.03 10*3/uL (ref 0.00–0.07)
Basophils Absolute: 0 10*3/uL (ref 0.0–0.1)
Basophils Relative: 0 %
Eosinophils Absolute: 0.1 10*3/uL (ref 0.0–0.5)
Eosinophils Relative: 2 %
HCT: 37.7 % (ref 36.0–46.0)
Hemoglobin: 12.4 g/dL (ref 12.0–15.0)
Immature Granulocytes: 0 %
Lymphocytes Relative: 12 %
Lymphs Abs: 0.9 10*3/uL (ref 0.7–4.0)
MCH: 30.3 pg (ref 26.0–34.0)
MCHC: 32.9 g/dL (ref 30.0–36.0)
MCV: 92.2 fL (ref 80.0–100.0)
Monocytes Absolute: 0.4 10*3/uL (ref 0.1–1.0)
Monocytes Relative: 5 %
Neutro Abs: 5.9 10*3/uL (ref 1.7–7.7)
Neutrophils Relative %: 81 %
Platelets: 232 10*3/uL (ref 150–400)
RBC: 4.09 MIL/uL (ref 3.87–5.11)
RDW: 12.4 % (ref 11.5–15.5)
WBC: 7.4 10*3/uL (ref 4.0–10.5)
nRBC: 0 % (ref 0.0–0.2)

## 2020-08-03 LAB — COMPREHENSIVE METABOLIC PANEL
ALT: 11 U/L (ref 0–44)
AST: 19 U/L (ref 15–41)
Albumin: 3.7 g/dL (ref 3.5–5.0)
Alkaline Phosphatase: 51 U/L (ref 38–126)
Anion gap: 11 (ref 5–15)
BUN: 15 mg/dL (ref 8–23)
CO2: 28 mmol/L (ref 22–32)
Calcium: 9.7 mg/dL (ref 8.9–10.3)
Chloride: 100 mmol/L (ref 98–111)
Creatinine, Ser: 0.91 mg/dL (ref 0.44–1.00)
GFR, Estimated: >60 mL/min (ref >60)
Glucose, Bld: 189 mg/dL — ABNORMAL HIGH (ref 70–99)
Potassium: 3.7 mmol/L (ref 3.5–5.1)
Sodium: 139 mmol/L (ref 135–145)
Total Bilirubin: 0.7 mg/dL (ref 0.3–1.2)
Total Protein: 6.5 g/dL (ref 6.5–8.1)

## 2020-08-03 LAB — LACTATE DEHYDROGENASE: LDH: 154 U/L (ref 98–192)

## 2020-08-03 MED ORDER — DENOSUMAB 60 MG/ML ~~LOC~~ SOSY: 60.0000 mg | PREFILLED_SYRINGE | Freq: Once | SUBCUTANEOUS | Status: DC

## 2020-08-03 NOTE — Progress Notes (Signed)
Patient assessed and labs reviewed by Dr. Ellin Saba. Okay for injection today. Primary nurse aware.

## 2020-08-03 NOTE — Progress Notes (Signed)
[facility] [address] [place] [address]   Patient Care Team: [name], MD as PCP - General (Internal Medicine) Fay Records, MD as PCP - Cardiology (Cardiology) Thompson Grayer, MD as PCP - Electrophysiology (Cardiology)  SUMMARY OF ONCOLOGIC HISTORY: Oncology History  Invasive ductal carcinoma of breast, female, right ([place])  10/09/2016 Procedure   Left breast lumpectomy by Dr. Arnoldo Morale   10/16/2016 Pathology Results   Invasive, grade 1, ductal carcinoma, spanning 0.18 cmIn greatest dimension.  Low and intermediate grade DCIS with associated calcifications.  Intraductal papilloma formation with associated low-grade DCIS.  DCIS is less than 0.1 cm from the margin in multiple areas including intermediate ductal carcinoma in situ adjacent to cauterized edge.    10/16/2016 Pathology Results   ER +100%, PR +90%, HER-2 negative.   10/23/2016 Cancer Staging   Cancer Staging Invasive ductal carcinoma of breast, female, right [facility]) Staging form: Breast, AJCC 8th Edition - Pathologic stage from 10/23/2016: Stage Unknown (pT1a, pNX, cM0, G1, ER: Positive, PR: Positive, HER2: Negative) - Signed by Baird Cancer, PA-C on 10/23/2016    11/14/2016 Imaging   CT CAP-  1. No compelling findings of metastatic breast cancer. 2. Acute mild diverticulitis of the descending colon. 3. Further distally in the descending colon but there is a cluster of small lymph nodes adjacent to the colon. Although possibly related to the diverticulitis this is a bit distend and this type of clustered nodes is unusual in this location. Following resolution of the acute process, consider colonoscopy to exclude an underlying occult cancer. 4. 3.7 cm fatty mass along the lumen of the descending colon favors a lipoma or fatty polyp. 5. Aortic Atherosclerosis (ICD10-I70.0). 6. Minimal tree-in-bud reticulonodular opacities in the left lower lobe characteristic for atypical infectious  bronchiolitis. 7. Left groin hernia containing adipose tissue, probably a direct inguinal hernia. 8. Exaggerated lumbar lordosis with grade 1 degenerative anterolisthesis at L4-5.   11/26/2016 Imaging   Bone density- BMD as determined from Fem[facility] Neck Left is 0.820 g/cm2 with a T-Score of -1.6.  This patient is considered OSTEOPENIC according to [facility]) criteria.     CHIEF COMPLIANT: Follow-up for right breast cancer and follicular lymphoma   INTERVAL HISTORY: [name] is a 85 y.o. female here today for follow up of her right breast cancer and follicular lymphoma. Her last visit was on 03/16/2020.   Today she is accompanied by her son. She went to [facility] on 12/13 after developing AMS due to a UTI; she was discharged 4 days later. Her son has noticed that her memory has been declining. She denies having any F/C or night sweats. She was recently prescribed Lasix for her leg swelling. She continues taking letrozole daily. She continues taking calcium and vitamin D daily.   REVIEW OF SYSTEMS:   Review of Systems  Constitutional: Positive for appetite change (90%). Negative for chills, diaphoresis and fever.  Cardiovascular: Positive for leg swelling (on Lasix).  Musculoskeletal: Positive for back pain (4/10 back pain).  Psychiatric/Behavioral: The patient is nervous/anxious.   All other systems reviewed and are negative.   I have reviewed the past medical history, past surgical history, social history and family history with the patient and they are unchanged from previous note.   ALLERGIES:   is allergic to cymbalta [duloxetine hcl].   MEDICATIONS:  Current Outpatient Medications  Medication Sig Dispense Refill  . ALPRAZolam (XANAX) 0.5 MG tablet Take 1 tablet (0.5 mg  total) by mouth at bedtime. 30 tablet 2  . aspirin EC 81 MG tablet Take 81 mg by mouth every morning.    . calcium carbonate (OSCAL) 1500 (600 Ca) MG TABS tablet Take 600 mg of  elemental calcium by mouth daily with supper.     . cholecalciferol (VITAMIN D) 1000 units tablet Take 1 tablet (1,000 Units total) by mouth daily. 30 tablet 11  . donepezil (ARICEPT) 5 MG tablet Take 5 mg by mouth daily.    Marland Kitchen escitalopram (LEXAPRO) 20 MG tablet Take 1 tablet (20 mg total) by mouth daily. 30 tablet 2  . furosemide (LASIX) 20 MG tablet Take 1 tablet (20 mg total) by mouth 3 (three) times a week. 45 tablet 3  . gabapentin (NEURONTIN) 100 MG capsule Take 1 capsule (100 mg total) by mouth at bedtime. 30 capsule 2  . letrozole (FEMARA) 2.5 MG tablet TAKE 1 TABLET(2.5 MG) BY MOUTH DAILY 90 tablet 3  . metFORMIN (GLUCOPHAGE) 500 MG tablet Take 1 tablet (500 mg total) by mouth 2 (two) times daily with a meal. 60 tablet 0  . metoprolol succinate (TOPROL-XL) 25 MG 24 hr tablet Take 1 tablet (25 mg total) by mouth daily. Please keep upcoming appt in January 2022 before anymore refills. Thank you 30 tablet 1  . oxybutynin (DITROPAN) 5 MG tablet Take 5 mg by mouth at bedtime.    . risperiDONE (RISPERDAL) 0.5 MG tablet Take 1 tablet (0.5 mg total) by mouth at bedtime.    Marland Kitchen venlafaxine XR (EFFEXOR-XR) 150 MG 24 hr capsule Take 1 capsule (150 mg total) by mouth daily with breakfast. 30 capsule    No current facility-administered medications for this visit.     PHYSICAL EXAMINATION: Performance status (ECOG): 1 - Symptomatic but completely ambulatory  Vitals:   08/03/20 1214 08/03/20 1224  BP: (!) 145/81 (!) 145/81  Pulse: 76   Resp: 17 17  Temp: 98.2 F (36.8 C) 98.2 F (36.8 C)  SpO2: 97% 97%   Wt Readings from Last 3 Encounters:  08/03/20 157 lb 11.2 oz (71.5 kg)  08/01/20 158 lb 9.6 oz (71.9 kg)  07/11/20 152 lb 12.5 oz (69.3 kg)   Physical Exam Vitals reviewed.  Constitutional:      Appearance: Normal appearance.     Comments: In wheelchair  Cardiovascular:     Rate and Rhythm: Normal rate and regular rhythm.     Pulses: Normal pulses.     Heart sounds: Normal heart  sounds.  Pulmonary:     Effort: Pulmonary effort is normal.     Breath sounds: Normal breath sounds.  Chest:  Breasts:     Right: Normal. No bleeding, inverted nipple, mass, nipple discharge, skin change (lumpectomy scar well healed), tenderness, axillary adenopathy or supraclavicular adenopathy.     Left: Normal. No bleeding, inverted nipple, mass, nipple discharge, skin change, tenderness, axillary adenopathy or supraclavicular adenopathy.    Abdominal:     Palpations: Abdomen is soft. There is no hepatomegaly, splenomegaly or mass.     Tenderness: There is no abdominal tenderness.     Hernia: No hernia is present.  Musculoskeletal:     Right lower leg: Edema present.     Left lower leg: Edema present.  Lymphadenopathy:     Cervical: No cervical adenopathy.     Upper Body:     Right upper body: No supraclavicular, axillary or pectoral adenopathy.     Left upper body: No supraclavicular, axillary or pectoral adenopathy.  Lower Body: No right inguinal adenopathy. No left inguinal adenopathy.  Neurological:     General: No focal deficit present.     Mental Status: She is alert and oriented to person, place, and time.  Psychiatric:        Mood and Affect: Mood normal.        Behavior: Behavior normal.     Breast Exam Chaperone: Milinda Antis, MD     LABORATORY DATA:  I have reviewed the data as listed CMP Latest Ref Rng & Units 08/03/2020 07/14/2020 07/13/2020  Glucose 70 - 99 mg/dL 189(H) 180(H) 175(H)  BUN 8 - 23 mg/dL _0 Creatinine 0.44 - 1.00 mg/dL 0.91 0.77 0.83  Sodium 135 - 145 mmol/L 139 138 138  Potassium 3.5 - 5.1 mmol/L 3.7 3.6 3.6  Chloride 98 - 111 mmol/L 100 100 100  CO2 22 - 32 mmol/L _1 Calcium 8.9 - 10.3 mg/dL 9.7 9.4 9.3  Total Protein 6.5 - 8.1 g/dL 6.5 6.2(L) 6.2(L)  Total Bilirubin 0.3 - 1.2 mg/dL 0.7 0.6 0.7  Alkaline Phos 38 - 126 U/L 51 42 45  AST 15 - 41 U/L 19 15 14(L)  ALT 0 - 44 U/L _2 Lab Results  Component  Value Date   CAN153 18.8 02/14/2020   CAN153 15.7 12/01/2018   Lab Results  Component Value Date   WBC 7.4 08/03/2020   HGB 12.4 08/03/2020   HCT 37.7 08/03/2020   MCV 92.2 08/03/2020   PLT 232 08/03/2020   NEUTROABS 5.9 08/03/2020    ASSESSMENT:  1.Stage Ia right breast cancer: -Status post lumpectomy with Dr. Arnoldo Morale on 10/09/2016. Pathology showed IDC of right breast, ER/PR positive, HER-2 negative. -Did not receive radiation. Started on anastrozole in March 2018. She did not take anastrozole from 05/17/2017 through 10/15/2017 likely from intolerance. -She was subsequently started on letrozole 2.5 mg daily. -Mammogram on 12/07/2019 was BI-RADS Category 2.  2.Osteopenia: -DEXA scan on 11/26/2017 shows T score of -1.7. -Prolia every 6 months started on 12/19/2016.  3. Hypermetabolic retroperitoneal, pelvic and left inguinal adenopathy: -PET scan on 02/14/2020 shows hypermetabolic lower retroperitoneal, common iliac and external iliac, left obturator, left pelvic sidewall, left sciatic notch and left inguinal adenopathy. Left upper inguinal lymph node measures 2.4 cm in short axis, SUV 11.9. Left lower inguinal lymph node SUV 13.4. Left pelvic sidewall lymph node SUV 12.1. -Left inguinal biopsy on 08/06/3233 shows follicular lymphoma, grade 3A.  Lymphoid follicles are composed by mixture of large lymphoid cells including large cleaved lymphocytes with vesicular chromatin and small nucleoli admixed with small round to angulated lymphoid cells.  Large lymphoid cells within the follicles are present more than 15 cells per hpf.  Ki-67 is 25%. -Given her advanced age and performance status, we chose for active surveillance.   PLAN:  1.Stage Ia right breast cancer: -She is continuing letrozole without any problems. -Physical examination today did not reveal any palpable masses.  Right breast lumpectomy site is within normal limits.  No palpable adenopathy. -We will order mammogram  to be done in May of this year.  2.Osteopenia: -Continue Prolia every 6 months.  Continue calcium and vitamin D.  3.  Stage III A follicular lymphoma, large cell type (grade 3A): -She does not have any fevers, night sweats or weight loss.  No severe fatigue. -No pain in the lymph node areas. -LDH is normal.  LFTs and CBC are normal. -We will continue to monitor closely. -  RTC 6 months for follow-up.   Breast Cancer therapy associated bone loss: I have recommended calcium, Vitamin D and weight bearing exercises.   No orders of the defined types were placed in this encounter.  The patient has a good understanding of the overall plan. she agrees with it. she will call with any problems that may develop before the next visit here.    Derek Jack, MD [facility] [phone]   I, Milinda Antis, am acting as a scribe for Dr. Sanda Linger.  I, Derek Jack MD, have reviewed the above documentation for accuracy and completeness, and I agree with the above.

## 2020-08-03 NOTE — Patient Instructions (Signed)
[facility] at [facility] Discharge Instructions  You were seen today by Dr. Ellin Saba. He went over your recent results. You received your Prolia injection today. You will be scheduled for an MRI of your breasts after 5/11. Dr. Ellin Saba will see you back in 6 months for labs and follow up.   Thank you for choosing [facility] at [facility] to provide your oncology and hematology care.  To afford each patient quality time with our provider, please arrive at least 15 minutes before your scheduled appointment time.   If you have a lab appointment with the Cancer Center please come in thru the Main Entrance and check in at the main information desk  You need to re-schedule your appointment should you arrive 10 or more minutes late.  We strive to give you quality time with our providers, and arriving late affects you and other patients whose appointments are after yours.  Also, if you no show three or more times for appointments you may be dismissed from the clinic at the providers discretion.     Again, thank you for choosing [facility].  Our hope is that these requests will decrease the amount of time that you wait before being seen by our physicians.       _____________________________________________________________  Should you have questions after your visit to [facility], please contact our office at [phone] between the hours of 8:00 a.m. and 4:30 p.m.  Voicemails left after 4:00 p.m. will not be returned until the following business day.  For prescription refill requests, have your pharmacy contact our office and allow 72 hours.    Cancer Center Support Programs:   > Cancer Support Group  2nd Tuesday of the month 1pm-2pm, Journey Room

## 2020-08-03 NOTE — Progress Notes (Signed)
Prior authorization from insurance for Prolia is still pending.  We are holding Prolia today and rescheduling patient for next week.

## 2020-08-07 ENCOUNTER — Encounter: Payer: Medicare HMO | Admitting: Physician Assistant

## 2020-08-10 ENCOUNTER — Telehealth: Payer: Self-pay | Admitting: Internal Medicine

## 2020-08-10 NOTE — Telephone Encounter (Signed)
Pt seen by Kathleen Argue Paroxysmal atrial fibrillatoin seen on monitor Would stop aspirin and start Eliquis 5 mg 2x per day Follow    Watch for falls   If falls may have to step back to aspirin

## 2020-08-10 NOTE — Telephone Encounter (Signed)
Patient's son called back. Informed him of Dr. [name]' message. Patient's son stated that patient has had 3 to 4 falls during the last 3 to 4 months, which included 2 falls in one week. He would like to have a virtual visit with Dr. [name] or see Dr. [name] in [place]. Will keep patient on Aspirin for now, until Dr. [name] can advise on Eliquis, because patient seems to fall a lot.

## 2020-08-10 NOTE — Telephone Encounter (Signed)
Duplicate/error

## 2020-08-10 NOTE — Telephone Encounter (Signed)
Patient's son is returning call.

## 2020-08-10 NOTE — Telephone Encounter (Signed)
Left message for patient's son (DPR) to call back.

## 2020-08-11 NOTE — Telephone Encounter (Signed)
Spoke to son  Pt had a fall before thanksgiving   Was in bathroom  Difficult night   [address] between toilet and tub THe 2nd fall in early December she fell in yard  Going to neighbors house  With this wuold remain on ASA for now.

## 2020-08-14 ENCOUNTER — Ambulatory Visit (INDEPENDENT_AMBULATORY_CARE_PROVIDER_SITE_OTHER): Payer: Medicare HMO

## 2020-08-14 DIAGNOSIS — I639 Cerebral infarction, unspecified: Secondary | ICD-10-CM | POA: Diagnosis not present

## 2020-08-16 ENCOUNTER — Other Ambulatory Visit (HOSPITAL_COMMUNITY): Payer: Self-pay

## 2020-08-16 DIAGNOSIS — C50911 Malignant neoplasm of unspecified site of right female breast: Secondary | ICD-10-CM

## 2020-08-17 ENCOUNTER — Other Ambulatory Visit: Payer: Self-pay

## 2020-08-17 ENCOUNTER — Inpatient Hospital Stay (HOSPITAL_COMMUNITY): Payer: Medicare HMO

## 2020-08-17 ENCOUNTER — Encounter (HOSPITAL_COMMUNITY): Payer: Self-pay

## 2020-08-17 VITALS — BP 146/83 | HR 78 | Temp 96.8°F | Resp 16

## 2020-08-17 DIAGNOSIS — C50911 Malignant neoplasm of unspecified site of right female breast: Secondary | ICD-10-CM | POA: Diagnosis not present

## 2020-08-17 DIAGNOSIS — M818 Other osteoporosis without current pathological fracture: Secondary | ICD-10-CM

## 2020-08-17 LAB — CBC WITH DIFFERENTIAL/PLATELET
Abs Immature Granulocytes: 0.03 10*3/uL (ref 0.00–0.07)
Basophils Absolute: 0 10*3/uL (ref 0.0–0.1)
Basophils Relative: 0 %
Eosinophils Absolute: 0.1 10*3/uL (ref 0.0–0.5)
Eosinophils Relative: 2 %
HCT: 39.1 % (ref 36.0–46.0)
Hemoglobin: 12.8 g/dL (ref 12.0–15.0)
Immature Granulocytes: 0 %
Lymphocytes Relative: 14 %
Lymphs Abs: 1 10*3/uL (ref 0.7–4.0)
MCH: 30.1 pg (ref 26.0–34.0)
MCHC: 32.7 g/dL (ref 30.0–36.0)
MCV: 92 fL (ref 80.0–100.0)
Monocytes Absolute: 0.5 10*3/uL (ref 0.1–1.0)
Monocytes Relative: 7 %
Neutro Abs: 5.2 10*3/uL (ref 1.7–7.7)
Neutrophils Relative %: 77 %
Platelets: 228 10*3/uL (ref 150–400)
RBC: 4.25 MIL/uL (ref 3.87–5.11)
RDW: 12.4 % (ref 11.5–15.5)
WBC: 6.8 10*3/uL (ref 4.0–10.5)
nRBC: 0 % (ref 0.0–0.2)

## 2020-08-17 LAB — LACTATE DEHYDROGENASE: LDH: 157 U/L (ref 98–192)

## 2020-08-17 LAB — COMPREHENSIVE METABOLIC PANEL
ALT: 11 U/L (ref 0–44)
AST: 18 U/L (ref 15–41)
Albumin: 3.8 g/dL (ref 3.5–5.0)
Alkaline Phosphatase: 47 U/L (ref 38–126)
Anion gap: 10 (ref 5–15)
BUN: 17 mg/dL (ref 8–23)
CO2: 27 mmol/L (ref 22–32)
Calcium: 9.5 mg/dL (ref 8.9–10.3)
Chloride: 98 mmol/L (ref 98–111)
Creatinine, Ser: 0.91 mg/dL (ref 0.44–1.00)
GFR, Estimated: >60 mL/min (ref >60)
Glucose, Bld: 138 mg/dL — ABNORMAL HIGH (ref 70–99)
Potassium: 4.3 mmol/L (ref 3.5–5.1)
Sodium: 135 mmol/L (ref 135–145)
Total Bilirubin: 0.4 mg/dL (ref 0.3–1.2)
Total Protein: 6.4 g/dL — ABNORMAL LOW (ref 6.5–8.1)

## 2020-08-17 LAB — CUP PACEART REMOTE DEVICE CHECK
Date Time Interrogation Session: 20220120015516
Implantable Pulse Generator Implant Date: 20210325

## 2020-08-17 MED ORDER — DENOSUMAB 60 MG/ML ~~LOC~~ SOSY
60.0000 mg | PREFILLED_SYRINGE | Freq: Once | SUBCUTANEOUS | Status: AC
  Administered 2020-08-17: 60 mg via SUBCUTANEOUS
  Filled 2020-08-17: qty 1

## 2020-08-17 NOTE — Progress Notes (Signed)
Amy [name] tolerated Prolia injection well without complaints or incident. Calcium 9.5 today and pt denied any tooth or jaw pain and no recent or future dental visits prior to administering this medication. VSS. Pt discharged via wheelchair in satisfactory condition accompanied by caregiver

## 2020-08-17 NOTE — Patient Instructions (Signed)
[facility] at [facility] Discharge Instructions  Received Prolia injection today. Follow-up as scheduled   Thank you for choosing [facility] at [facility] to provide your oncology and hematology care.  To afford each patient quality time with our provider, please arrive at least 15 minutes before your scheduled appointment time.   If you have a lab appointment with the Cancer Center please come in thru the Main Entrance and check in at the main information desk.  You need to re-schedule your appointment should you arrive 10 or more minutes late.  We strive to give you quality time with our providers, and arriving late affects you and other patients whose appointments are after yours.  Also, if you no show three or more times for appointments you may be dismissed from the clinic at the providers discretion.     Again, thank you for choosing [facility].  Our hope is that these requests will decrease the amount of time that you wait before being seen by our physicians.       _____________________________________________________________  Should you have questions after your visit to [facility], please contact our office at [phone] and follow the prompts.  Our office hours are 8:00 a.m. and 4:30 p.m. Monday - Friday.  Please note that voicemails left after 4:00 p.m. may not be returned until the following business day.  We are closed weekends and major holidays.  You do have access to a nurse 24-7, just call the main number to the clinic [phone] and do not press any options, hold on the line and a nurse will answer the phone.    For prescription refill requests, have your pharmacy contact our office and allow 72 hours.    Due to Covid, you will need to wear a mask upon entering the hospital. If you do not have a mask, a mask will be given to you at the Main Entrance upon arrival. For doctor visits, patients may have  1 support person age 18 or older with them. For treatment visits, patients can not have anyone with them due to social distancing guidelines and our immunocompromised population.     

## 2020-08-21 NOTE — Telephone Encounter (Signed)
LMOM for son to call office back to sch appt for pt. Office number left on voicemail.

## 2020-08-22 LAB — BASIC METABOLIC PANEL
BUN/Creatinine Ratio: 16 (ref 12–28)
BUN: 14 mg/dL (ref 8–27)
CO2: 27 mmol/L (ref 20–29)
Calcium: 9.3 mg/dL (ref 8.7–10.3)
Chloride: 101 mmol/L (ref 96–106)
Creatinine, Ser: 0.85 mg/dL (ref 0.57–1.00)
GFR calc Af Amer: 74 mL/min/{1.73_m2} (ref >59)
GFR calc non Af Amer: 64 mL/min/{1.73_m2} (ref >59)
Glucose: 149 mg/dL — ABNORMAL HIGH (ref 65–99)
Potassium: 4 mmol/L (ref 3.5–5.2)
Sodium: 143 mmol/L (ref 134–144)

## 2020-08-22 LAB — PRO B NATRIURETIC PEPTIDE: NT-Pro BNP: 120 pg/mL (ref 0–738)

## 2020-08-29 NOTE — Progress Notes (Signed)
[facility] Summary Report / Loop Recorder 

## 2020-09-05 DIAGNOSIS — R030 Elevated blood-pressure reading, without diagnosis of hypertension: Secondary | ICD-10-CM | POA: Insufficient documentation

## 2020-09-05 DIAGNOSIS — S3210XA Unspecified fracture of sacrum, initial encounter for closed fracture: Secondary | ICD-10-CM | POA: Insufficient documentation

## 2020-09-05 DIAGNOSIS — Z6827 Body mass index (BMI) 27.0-27.9, adult: Secondary | ICD-10-CM | POA: Insufficient documentation

## 2020-09-06 ENCOUNTER — Telehealth: Payer: Self-pay

## 2020-09-06 NOTE — Telephone Encounter (Signed)
   [facility] Pre-operative Risk Assessment    HEARTCARE STAFF: - Please ensure there is not already an duplicate clearance open for this procedure. - Under Visit Info/Reason for Call, type in Other and utilize the format Clearance MM/DD/YY or Clearance TBD. Do not use dashes or single digits. - If request is for dental extraction, please clarify the # of teeth to be extracted.  Request for surgical clearance:  1. What type of surgery is being performed? L3-S1 POSTERIOR LATERAL FUSION W/S2 ALAR ILIAC FIXATION   2. When is this surgery scheduled? TBD   3. What type of clearance is required (medical clearance vs. Pharmacy clearance to hold med vs. Both)? BOTH  4. Are there any medications that need to be held prior to surgery and how long? NEED INSTRUCTION FOR ASA  5. Practice name and name of physician performing surgery? [facility]   6. What is the office phone number? [phone]   7.   What is the office fax number? [phone]  8.   Anesthesia type (None, local, MAC, general) ? GENERAL   Jacinta Shoe 09/06/2020, 5:16 PM  _________________________________________________________________   (provider comments below)

## 2020-09-07 ENCOUNTER — Telehealth: Payer: Self-pay | Admitting: Emergency Medicine

## 2020-09-07 NOTE — Telephone Encounter (Signed)
Faxed signed surgical clearance for patient to [facility] @ [phone].  Received OK transmission.

## 2020-09-07 NOTE — Telephone Encounter (Signed)
DPR ok to s/w pt's son Amy [name] who has been made aware pt will need an appt for pre op clearance. Pt's son asked if appt could be in the [facility] office as this is easier on the pt. Pre op appt has been made to see Amy [name], [facility] 10/09/20 1 pm same day Dr. [name] is in the office as well. Amy [name] is fine with appt not until 10/09/20 as this is not an urgent surgery per pt's son and is a bit nervous about the surgery. Amy [name] states the pt is afraid once she has this surgery she may end up in a SNF and is very nervous about the surgery. I will forward notes to MD for upcoming appt. Will send FYI to requesting office pt has appt 10/09/20.

## 2020-09-07 NOTE — Telephone Encounter (Signed)
   Primary Cardiologist: [name], MD  Chart reviewed as part of pre-operative protocol coverage. Because of Amy [name]'s past medical history and time since last visit, she will require a follow-up visit in order to better assess preoperative cardiovascular risk.  She has complex medical history with cardiomyopathy, dementia, encephalopathy, atrial fib, DM, stroke. She was seen virtually in January 2022 with initiation of Lasix and recommendation for in-person visit to follow up. She has only had telemedicine evaluations since last March. Needs in-person OV for clearance given procedure and medical history.  Pre-op covering staff: - Please schedule appointment and call patient to inform them. If patient already had an upcoming appointment within acceptable timeframe, please add "pre-op clearance" to the appointment notes so provider is aware. - Please contact requesting surgeon's office via preferred method (i.e, phone, fax) to inform them of need for appointment prior to surgery.  Does not appear to be on aspirin for prior cardiac reasons, only stroke, so would defer to primary care provider or neurology for input on holding for procedure. Not on [place] otherwise.  Charlie Pitter, PA-C  09/07/2020, 11:52 AM

## 2020-09-08 ENCOUNTER — Other Ambulatory Visit: Payer: Self-pay | Admitting: Neurosurgery

## 2020-09-08 ENCOUNTER — Other Ambulatory Visit (HOSPITAL_COMMUNITY): Payer: Self-pay | Admitting: Neurosurgery

## 2020-09-08 DIAGNOSIS — S3210XA Unspecified fracture of sacrum, initial encounter for closed fracture: Secondary | ICD-10-CM

## 2020-09-08 DIAGNOSIS — S322XXA Fracture of coccyx, initial encounter for closed fracture: Secondary | ICD-10-CM

## 2020-09-14 ENCOUNTER — Ambulatory Visit (INDEPENDENT_AMBULATORY_CARE_PROVIDER_SITE_OTHER): Payer: Medicare HMO

## 2020-09-14 ENCOUNTER — Other Ambulatory Visit (HOSPITAL_COMMUNITY): Payer: Self-pay | Admitting: Psychiatry

## 2020-09-14 DIAGNOSIS — I639 Cerebral infarction, unspecified: Secondary | ICD-10-CM | POA: Diagnosis not present

## 2020-09-14 NOTE — Telephone Encounter (Signed)
Call son to schedule appt

## 2020-09-15 ENCOUNTER — Other Ambulatory Visit (HOSPITAL_COMMUNITY): Payer: Self-pay | Admitting: Psychiatry

## 2020-09-18 IMAGING — DX DG HAND COMPLETE 3+V*L*
3 series · 3 of 3 positions shown · non-contrast
Comparison: None.

CLINICAL DATA: Bruising, fall

EXAM:
LEFT HAND - COMPLETE 3+ VIEW

[hand ap]
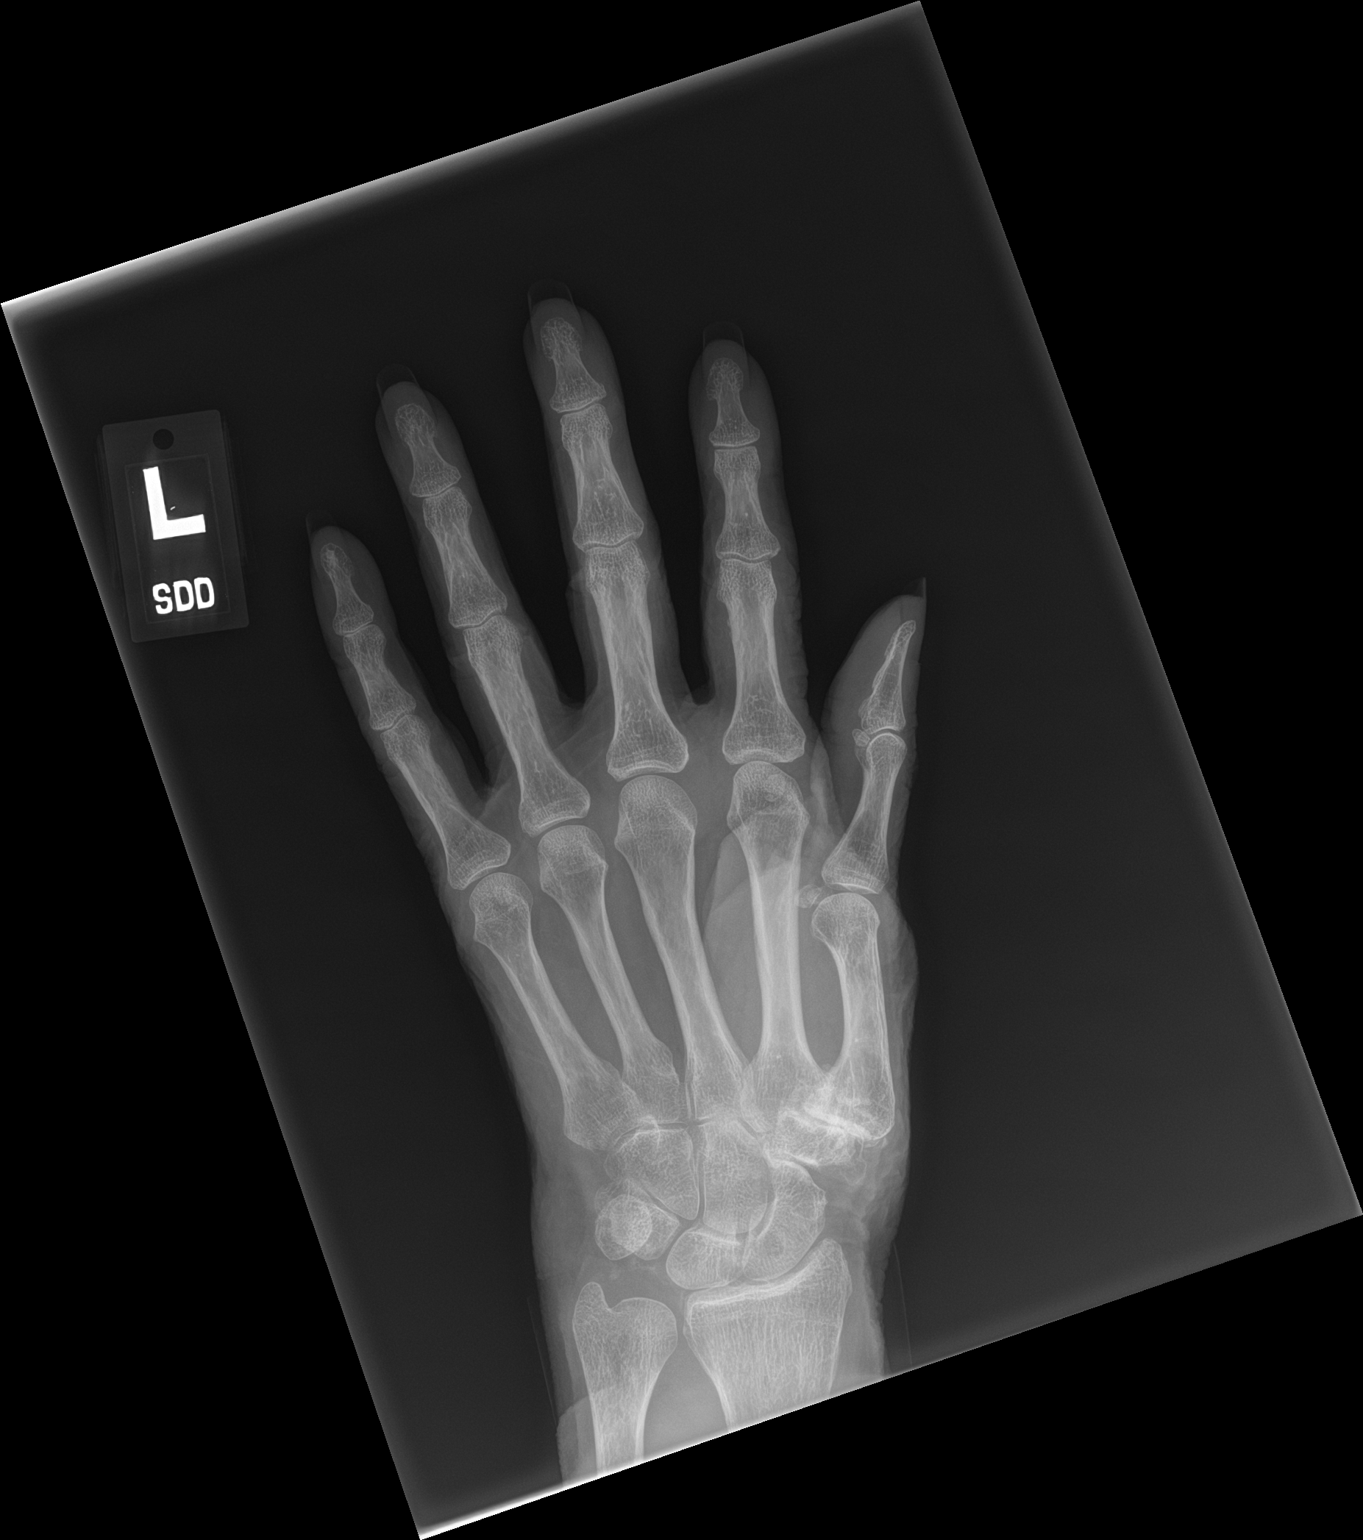

[hand obl]
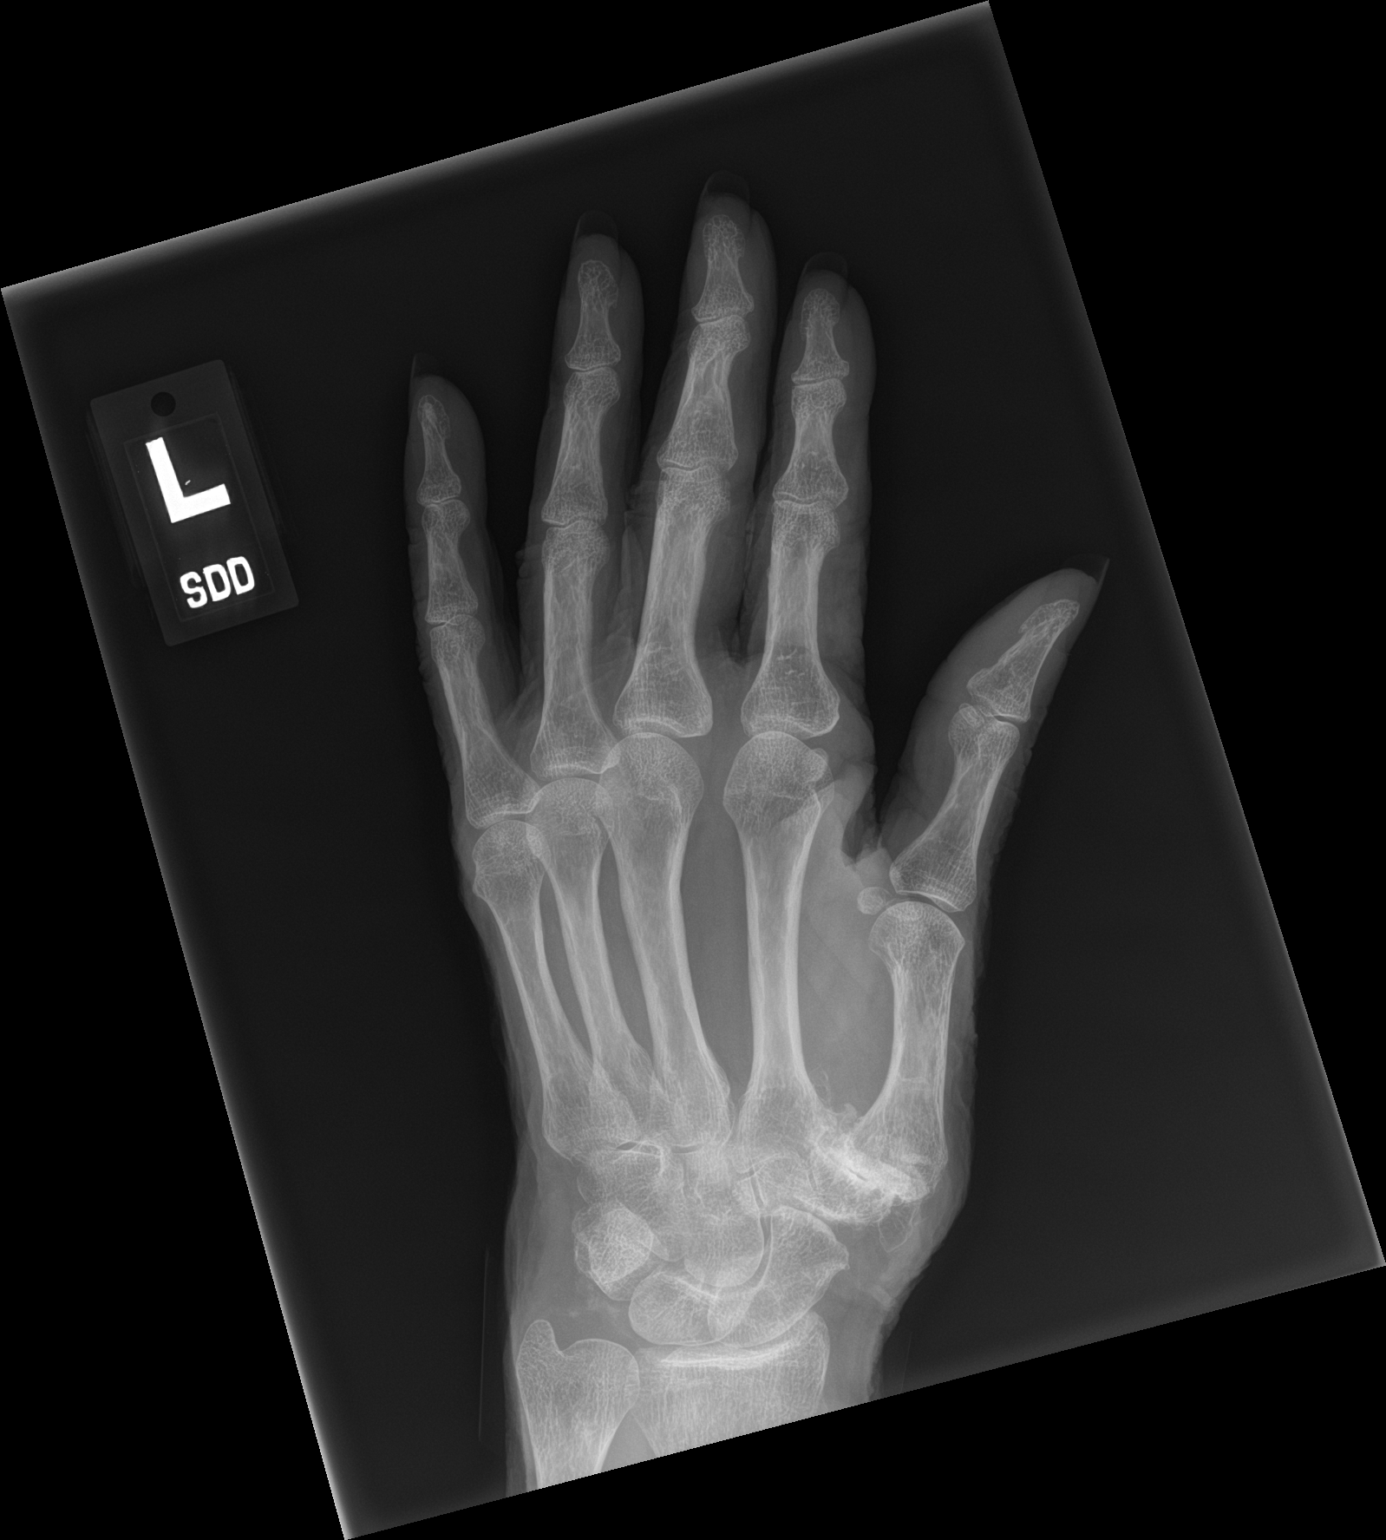

[hand lat]
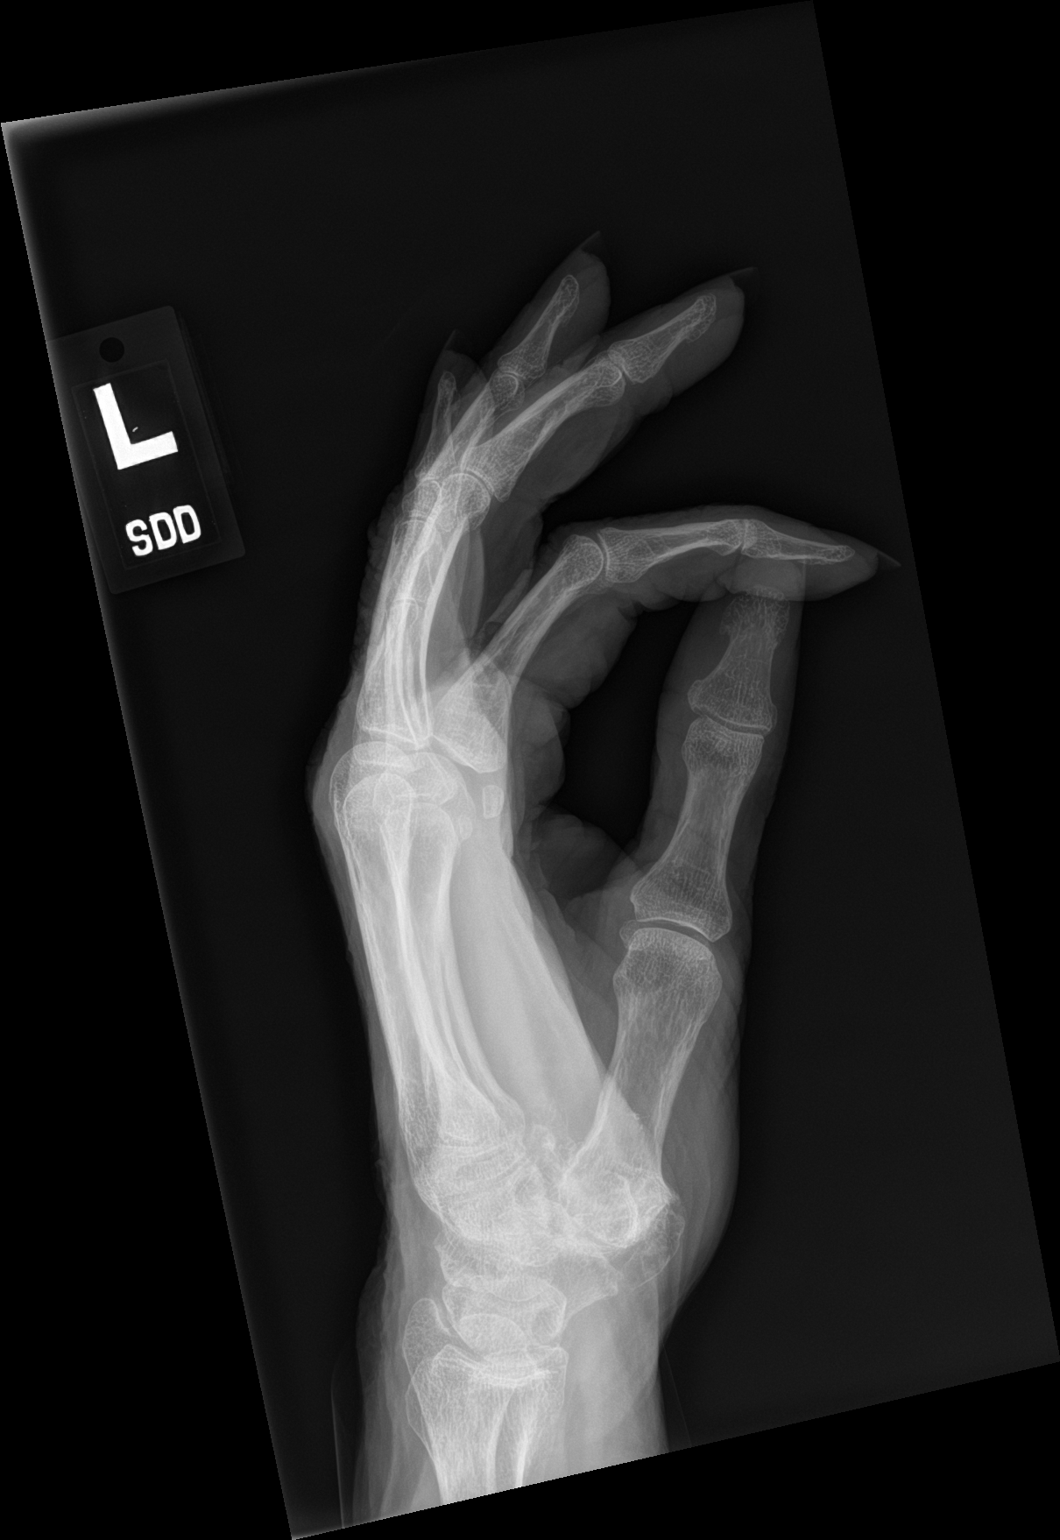

[3 of 3 positions shown; findings below may reference images not displayed]

FINDINGS: No fracture or malalignment. Advanced degenerative change at the
first CMC joint. Mild cartilaginous calcification.
IMPRESSION: No acute osseous abnormality.

## 2020-09-18 IMAGING — CT CT CERVICAL SPINE W/O CM
3 of 4 series · 12 of 33 positions shown, 14 images · non-contrast
Comparison: No prior CT.

CLINICAL DATA: Fall 4 days ago striking head.

Polytrauma, critical, head/C-spine injury suspected
EXAM:
CT CERVICAL SPINE WITHOUT CONTRAST
TECHNIQUE: Multidetector CT imaging of the cervical spine was performed without
intravenous contrast. Multiplanar CT image reconstructions were also
generated.

[Series 5: sagittal bone · sagittal · 0.26mm/px · 5 of 61 slices shown, 6 images]
[im 21/61  bone]
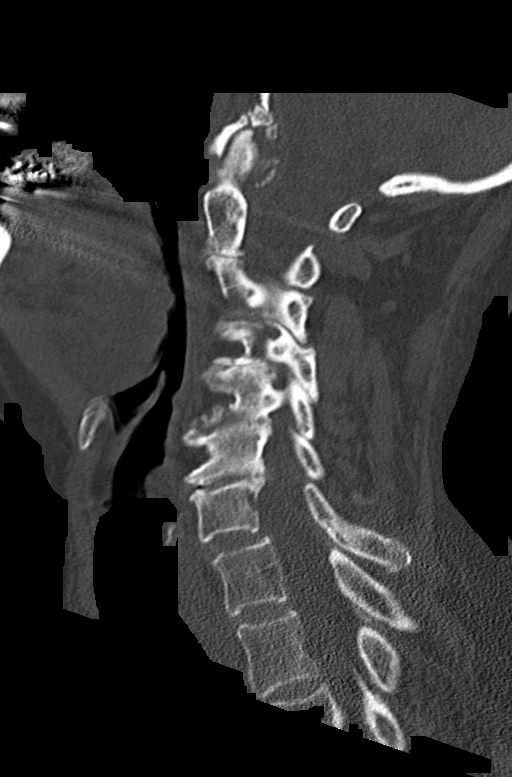
[im 26/61  bone]
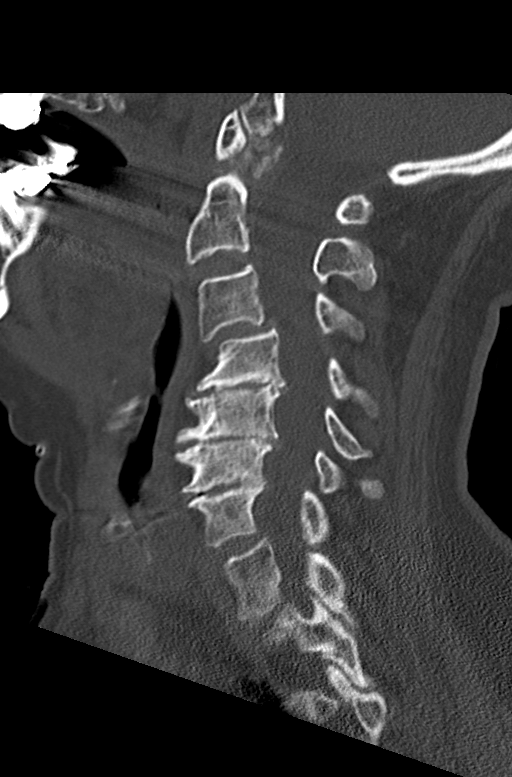
[im 31/61  soft-tissue]
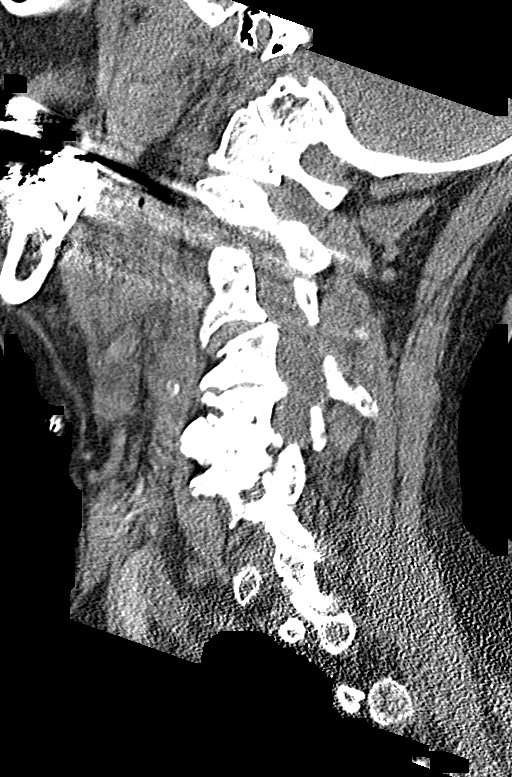
[im 31/61  bone]
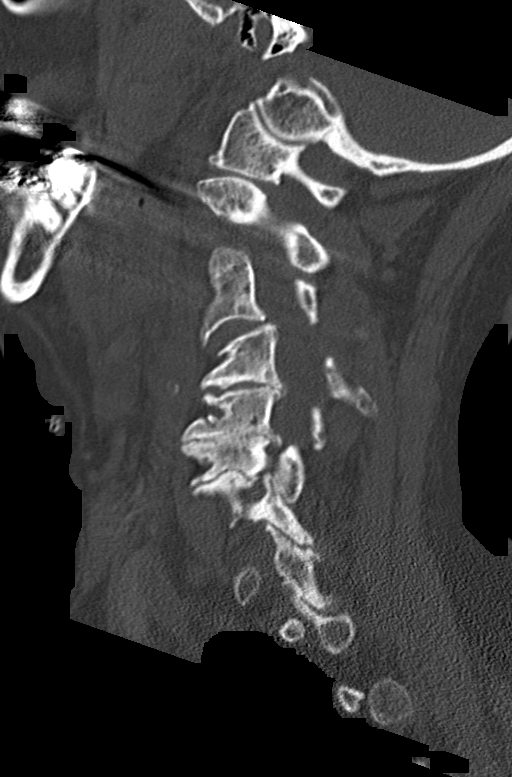
[im 36/61  bone]
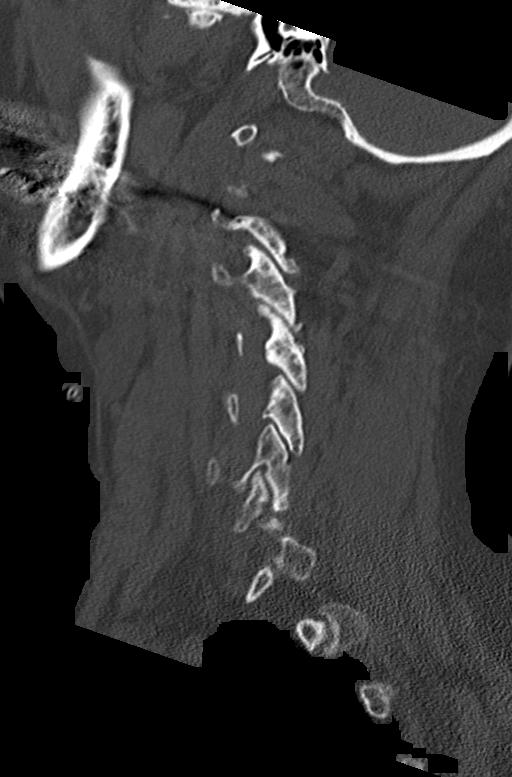
[im 41/61  bone]
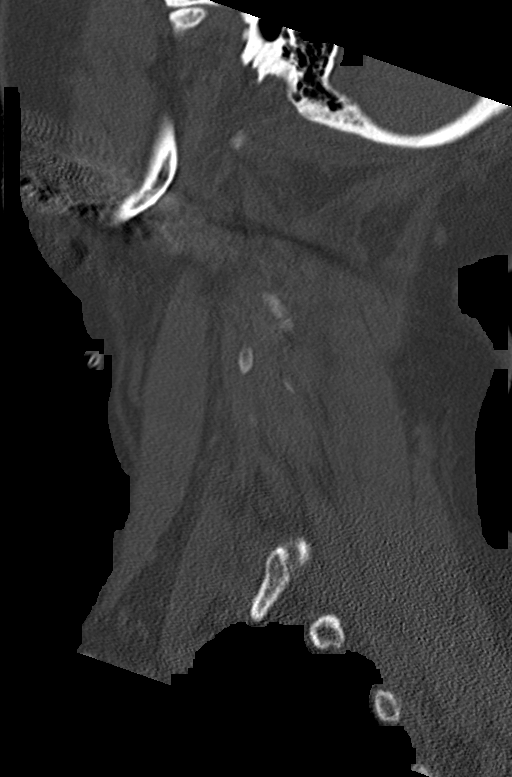

[Series 6: coronal bone · coronal · 0.25mm/px · 3 of 61 slices shown]
[im 13/61  bone]
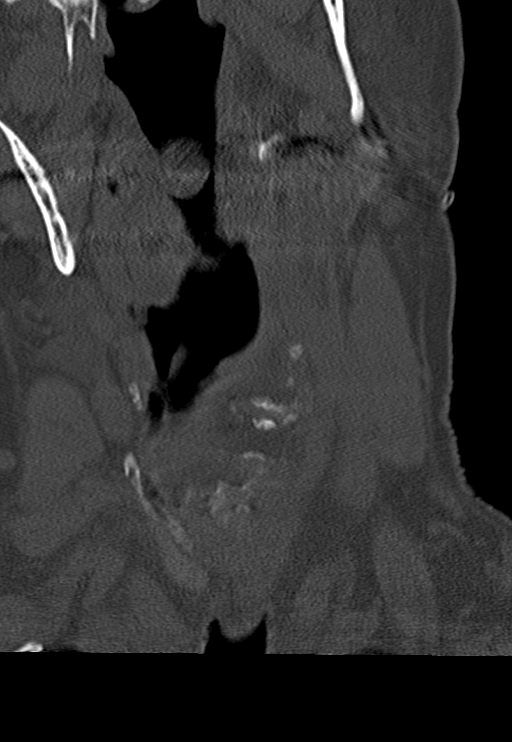
[im 25/61  bone]
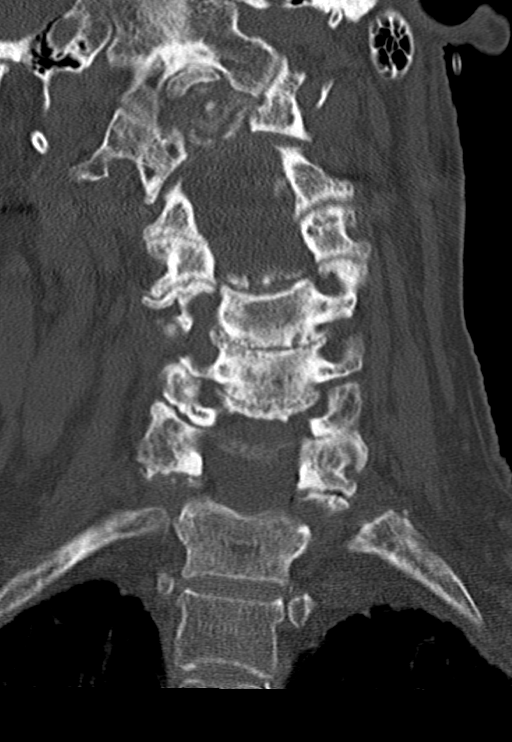
[im 37/61  bone]
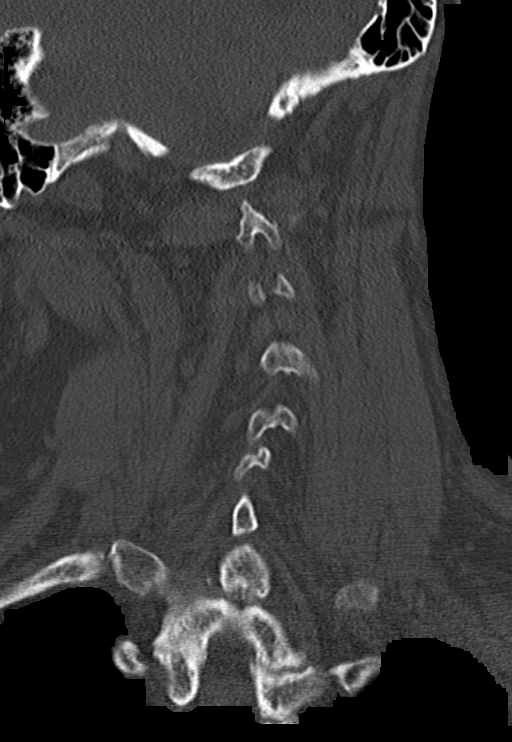

[Series 7: orthogonal axials · axial · 0.21mm/px · z∈[-136,-68]mm · 4 of 87 slices shown, 5 images]
[im 15/87  soft-tissue]
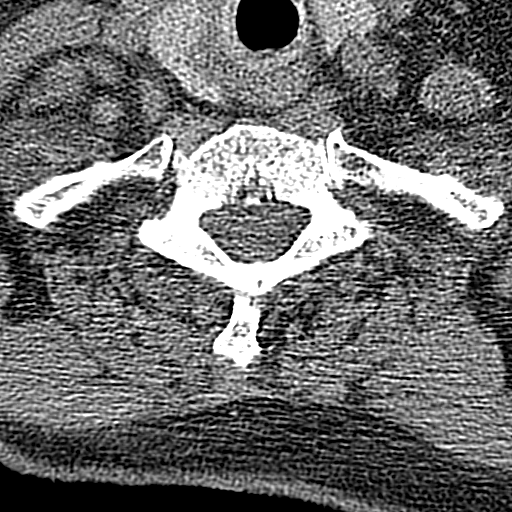
[im 15/87  bone]
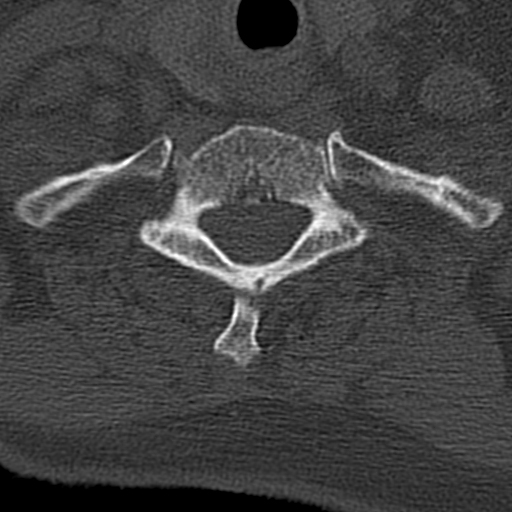
[im 29/87  bone]
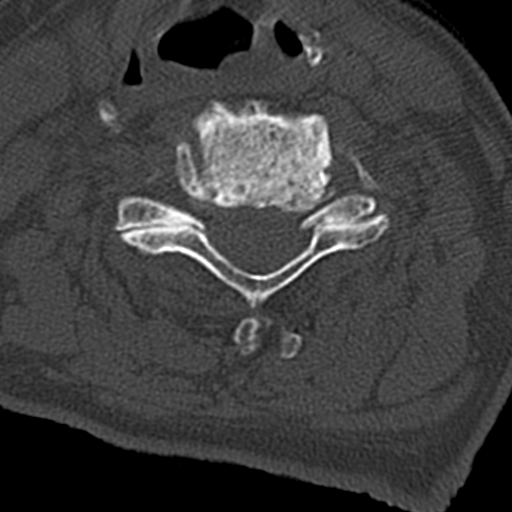
[im 58/87  bone]
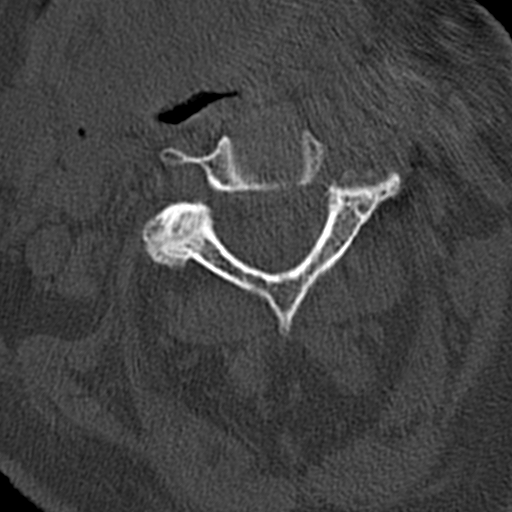
[im 72/87  bone]
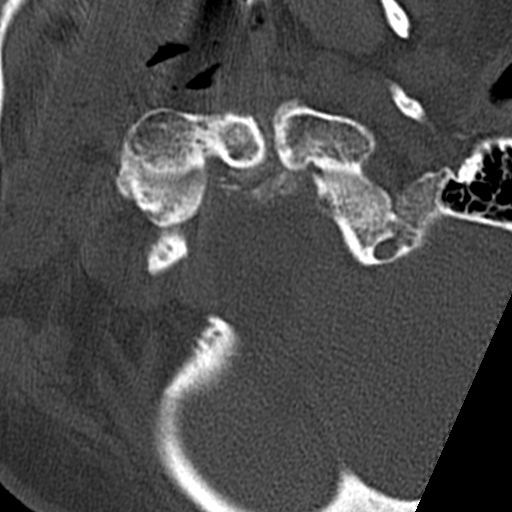

[12 of 33 positions shown; findings below may reference images not displayed]

FINDINGS: Alignment: Patient is tilted in the scanner with broad-based
leftward curvature. Grade 1 anterolisthesis of C3 on C4, minimal
retrolisthesis of C5 on C6, and anterolisthesis of C7 on T1 all
appear degenerative and facet mediated. No evidence of traumatic
subluxation.

Skull base and vertebrae: No acute fracture. Vertebral body heights
are maintained. The dens and skull base are intact. Degenerative
fragmentation at C1-C2.

Soft tissues and spinal canal: No prevertebral fluid or swelling. No
visible canal hematoma.

Disc levels: Disc space narrowing and endplate spurring from C3-C4
through C6-C7, most prominent at C5-C6. Prominent multilevel facet
hypertrophy.

Upper chest: No acute findings allowing for motion. 3 mm right upper
lobe pulmonary nodule, series 3, image 83, stable dating back to
11/13/2016 chest CT and considered benign.

Other: None.
IMPRESSION: Degenerative change in the cervical spine without acute fracture or
subluxation.

Broad-based leftward curvature may be scoliosis or positional.

## 2020-09-19 ENCOUNTER — Ambulatory Visit: Payer: Medicare HMO | Admitting: Internal Medicine

## 2020-09-19 LAB — CUP PACEART REMOTE DEVICE CHECK
Date Time Interrogation Session: 20220222020432
Implantable Pulse Generator Implant Date: 20210325

## 2020-09-20 NOTE — Progress Notes (Signed)
[facility] Summary Report / Loop Recorder 

## 2020-09-21 ENCOUNTER — Other Ambulatory Visit: Payer: Self-pay

## 2020-09-21 ENCOUNTER — Telehealth (INDEPENDENT_AMBULATORY_CARE_PROVIDER_SITE_OTHER): Payer: Medicare HMO | Admitting: Psychiatry

## 2020-09-21 ENCOUNTER — Encounter (HOSPITAL_COMMUNITY): Payer: Self-pay | Admitting: Psychiatry

## 2020-09-21 ENCOUNTER — Encounter (HOSPITAL_COMMUNITY): Payer: Self-pay

## 2020-09-21 DIAGNOSIS — F330 Major depressive disorder, recurrent, mild: Secondary | ICD-10-CM

## 2020-09-21 MED ORDER — ESCITALOPRAM OXALATE 20 MG PO TABS: 20.0000 mg | ORAL_TABLET | Freq: Every day | ORAL | 2 refills | Status: DC

## 2020-09-21 MED ORDER — VENLAFAXINE HCL ER 150 MG PO CP24: ORAL_CAPSULE | ORAL | 2 refills | Status: DC

## 2020-09-21 MED ORDER — ALPRAZOLAM 0.5 MG PO TABS: 0.5000 mg | ORAL_TABLET | Freq: Every day | ORAL | 2 refills | Status: DC

## 2020-09-21 MED ORDER — RISPERIDONE 0.5 MG PO TABS: 0.5000 mg | ORAL_TABLET | Freq: Every day | ORAL | Status: DC

## 2020-09-21 MED ORDER — DONEPEZIL HCL 5 MG PO TABS: 5.0000 mg | ORAL_TABLET | Freq: Every day | ORAL | 2 refills | Status: DC

## 2020-09-21 NOTE — Progress Notes (Addendum)
Virtual Visit via Telephone Note  I connected with Amy [name] on 09/25/20 at 10:40 AM EST by telephone and verified that I am speaking with the correct person using two identifiers.  Location: Patient:home Provider:home   I discussed the limitations, risks, security and privacy concerns of performing an evaluation and management service by telephone and the availability of in person appointments. I also discussed with the patient that there may be a patient responsible charge related to this service. The patient expressed understanding and agreed to proceed.     I discussed the assessment and treatment plan with the patient. The patient was provided an opportunity to ask questions and all were answered. The patient agreed with the plan and demonstrated an understanding of the instructions.   The patient was advised to call back or seek an in-person evaluation if the symptoms worsen or if the condition fails to improve as anticipated.  I provided 15 minutes of non-face-to-face time during this encounter.   Levonne Spiller, MD  [facility] MD/PA/NP OP Progress Note  09/21/2020 10:58 AM Amy [name]  MRN:  [id number]  Chief Complaint: depression, anxiety, confusion HPI: This patient is an 85 year old divorced white female who lives alone in [place].  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  She has 2 grown adopted children and 2 grandchildren.  She also has a caregiver who comes in 5 days a week.  The patient son returns for follow-up by phone today.  He was not with the patient.  I last saw her on 07/10/2020 and at that point she was very confused.  I suggested he bring her to the emergency room.  She was admitted for altered mental status.  It was thought this was due to polypharmacy and her trazodone was discontinued Risperdal was decreased to 0.5 mg and gabapentin was decreased as well.  The son reports that overall she is doing okay.  She still has days where she  is more confused particularly when she has to be alone.  At 1 point she again wandered over to the next-door neighbor's house.  This is only been one time however.  Most of the time she is doing fairly well is sleeping well and he does not seem to be particularly depressed.  I tried to call the patient at her home but she did not answer. Visit Diagnosis:    ICD-10-CM   1. Major depressive disorder, recurrent episode, mild with anxious distress (HCC)  F33.0 ALPRAZolam (XANAX) 0.5 MG tablet    Past Psychiatric History: Psychiatric hospitalization in her 46s, since then outpatient treatment  Past Medical History:  Past Medical History:  Diagnosis Date  . Anxiety   . Arthritis   . Back pain   . Colon polyps   . Dementia ([place])    early signs per daughter in law  . Depression   . Diabetes mellitus, type II ([place])    diet controlled  . Hypercholesterolemia   . Invasive ductal carcinoma of breast, female, right ([place])   . Osteopenia 11/26/2016  . Skin-picking disorder    has 3 open wounds on right wrist that are being treated. About a quarter in size.  . Stroke [facility])     Past Surgical History:  Procedure Laterality Date  . APPENDECTOMY    . BACK SURGERY    . BREAST BIOPSY Right 10/09/2016   Procedure: BREAST BIOPSY WITH NEEDLE LOCALIZATION;  Surgeon: [name], MD;  Location: [facility] ORS;  Service: General;  Laterality: Right;  . BREAST SURGERY Right   . BUBBLE STUDY  03/23/2019   Procedure: BUBBLE STUDY;  Surgeon: [name], MD;  Location: [facility] ENDOSCOPY;  Service: Cardiovascular;;  . BUNIONECTOMY Bilateral   . FACIAL COSMETIC SURGERY     drooping eyelid and brow  . HERNIA REPAIR Right    Inguinal x2  . implantable loop recorder placement  10/21/2019    Medtronic Reveal [place] model [id number]) implanted by Dr [name] for cryptogenic stroke indication  . INGUINAL LYMPH NODE BIOPSY Left 03/10/2020   Procedure: LEFT INGUINAL LYMPH NODE BIOPSY;  Surgeon: [name], MD;   Location: [facility] ORS;  Service: General;  Laterality: Left;  . KNEE ARTHROSCOPY Right   . SHOULDER SURGERY Right    rotator cuff repair and removal of bone spur  . TEE WITHOUT CARDIOVERSION N/A 03/23/2019   Procedure: TRANSESOPHAGEAL ECHOCARDIOGRAM (TEE);  Surgeon: [name], MD;  Location: [facility] ENDOSCOPY;  Service: Cardiovascular;  Laterality: N/A;  . TEMPOROMANDIBULAR JOINT SURGERY Right   . TOTAL KNEE ARTHROPLASTY Right 05/05/2018   Procedure: TOTAL KNEE ARTHROPLASTY;  Surgeon: [name], MD;  Location: [facility] ORS;  Service: Orthopedics;  Laterality: Right;    Family Psychiatric History: see below   Family History:  Family History  Problem Relation Age of Onset  . Anxiety disorder Mother   . Depression Mother   . Depression Maternal Grandmother   . Alcohol abuse Brother   . Cancer - Other Sister   . ADD / ADHD Neg Hx   . Bipolar disorder Neg Hx   . Dementia Neg Hx   . Drug abuse Neg Hx   . OCD Neg Hx   . Paranoid behavior Neg Hx   . Schizophrenia Neg Hx   . Seizures Neg Hx   . Sexual abuse Neg Hx   . Physical abuse Neg Hx     Social History:  Social History   Socioeconomic History  . Marital status: Divorced    Spouse name: Not on file  . Number of children: Not on file  . Years of education: Not on file  . Highest education level: Not on file  Occupational History  . Occupation: Nurse    Comment: Retired  Tobacco Use  . Smoking status: Former Smoker    Packs/day: 0.50    Years: 40.00    Pack years: 20.00    Types: Cigarettes    Quit date: 09/04/1995    Years since quitting: 25.0  . Smokeless tobacco: Never Used  Vaping Use  . Vaping Use: Never used  Substance and Sexual Activity  . Alcohol use: No  . Drug use: No    Types: Benzodiazepines, Hydrocodone  . Sexual activity: Never  Other Topics Concern  . Not on file  Social History Narrative   Divorced.  Former smoker, quit in 1997.  Nondrinker.    Social Determinants of Health   Financial  Resource Strain: Not on file  Food Insecurity: Not on file  Transportation Needs: Not on file  Physical Activity: Not on file  Stress: Not on file  Social Connections: Not on file    Allergies:  Allergies  Allergen Reactions  . Cymbalta [Duloxetine Hcl] Other (See Comments)    Hallucinated and couldn't think; got worse and worse the longer she took this.    Metabolic Disorder Labs: Lab Results  Component Value Date   HGBA1C 6.1 (H) 07/10/2020   MPG 128.37 07/10/2020   MPG 128.37  03/08/2020   No results found for: PROLACTIN Lab Results  Component Value Date   CHOL 208 (H) 07/11/2020   TRIG 120 07/11/2020   HDL 74 07/11/2020   CHOLHDL 2.8 07/11/2020   VLDL 24 07/11/2020   LDLCALC 110 (H) 07/11/2020   LDLCALC 57 05/06/2019   Lab Results  Component Value Date   TSH 1.238 10/07/2019   TSH 1.125 10/05/2019    Therapeutic Level Labs: No results found for: LITHIUM No results found for: VALPROATE No components found for:  CBMZ  Current Medications: Current Outpatient Medications  Medication Sig Dispense Refill  . ALPRAZolam (XANAX) 0.5 MG tablet Take 1 tablet (0.5 mg total) by mouth at bedtime. 30 tablet 2  . aspirin EC 81 MG tablet Take 81 mg by mouth every morning.    . calcium carbonate (OSCAL) 1500 (600 Ca) MG TABS tablet Take 600 mg of elemental calcium by mouth daily with supper.     . cholecalciferol (VITAMIN D) 1000 units tablet Take 1 tablet (1,000 Units total) by mouth daily. 30 tablet 11  . donepezil (ARICEPT) 5 MG tablet Take 1 tablet (5 mg total) by mouth daily. 30 tablet 2  . escitalopram (LEXAPRO) 20 MG tablet Take 1 tablet (20 mg total) by mouth daily. 30 tablet 2  . furosemide (LASIX) 20 MG tablet Take 1 tablet (20 mg total) by mouth 3 (three) times a week. 45 tablet 3  . gabapentin (NEURONTIN) 100 MG capsule Take 1 capsule (100 mg total) by mouth at bedtime. 30 capsule 2  . gabapentin (NEURONTIN) 300 MG capsule Take 300 mg by mouth daily.    Marland Kitchen  ibuprofen (ADVIL) 800 MG tablet Take 800 mg by mouth 3 (three) times daily as needed.    Marland Kitchen letrozole (FEMARA) 2.5 MG tablet TAKE 1 TABLET(2.5 MG) BY MOUTH DAILY 90 tablet 3  . metFORMIN (GLUCOPHAGE) 500 MG tablet Take 1 tablet (500 mg total) by mouth 2 (two) times daily with a meal. 60 tablet 0  . metoprolol succinate (TOPROL-XL) 25 MG 24 hr tablet Take 1 tablet (25 mg total) by mouth daily. Please keep upcoming appt in January 2022 before anymore refills. Thank you 30 tablet 1  . oxybutynin (DITROPAN) 5 MG tablet Take 5 mg by mouth at bedtime.    . risperiDONE (RISPERDAL) 0.5 MG tablet Take 1 tablet (0.5 mg total) by mouth at bedtime.    Marland Kitchen venlafaxine XR (EFFEXOR-XR) 150 MG 24 hr capsule TAKE 1 CAPSULE(150 MG) BY MOUTH TWICE DAILY 60 capsule 2   No current facility-administered medications for this visit.     Musculoskeletal: Strength & Muscle Tone: decreased Gait & Station: unsteady Patient leans: N/A  Psychiatric Specialty Exam: Review of Systems  Musculoskeletal: Positive for arthralgias, back pain and gait problem.  Psychiatric/Behavioral: Positive for confusion. The patient is nervous/anxious.   All other systems reviewed and are negative.   There were no vitals taken for this visit.There is no height or weight on file to calculate BMI.  General Appearance: NA  Eye Contact:  NA  Speech:  NA  Volume:  na  Mood:  NA  Affect:  NA  Thought Process:  NA  Orientation:  NA  Thought Content: NA   Suicidal Thoughts:  No  Homicidal Thoughts:  No  Memory:  NA  Judgement:  Poor  Insight:  Shallow  Psychomotor Activity:  Decreased  Concentration:  Concentration: Poor and Attention Span: Poor  Recall:  [place] of Knowledge: Fair  Language: NA  Akathisia:  No  Handed:  Right  AIMS (if indicated): not done  Assets:  Communication Skills Resilience Social Support  ADL's:  Intact  Cognition: Impaired,  Mild  Sleep:  Good   Screenings: MDI   Flowsheet Row Office Visit  from 02/15/2016 in [facility]  Total Score (max 50) 17    Mini-Mental   Flowsheet Row Office Visit from 11/22/2019 in [facility] Visit from 09/06/2019 in [facility]  Total Score (max 30 points ) 21 (P)  22       Assessment and Plan: This patient is an 85 year old female with a history of depression anxiety dementia history of strokes declining mental status and chronic pain.  Unfortunately I was not able to contact her today but her son gave me an update.  For now I think we should continue what she is doing as she is fairly stable.  She will continue Lexapro 20 mg as well as Effexor XR 150 mg twice daily for depression, Risperdal 0.5 mg at bedtime for paranoid ideation, Aricept 5 mg daily for dementia and Xanax 0.5 mg daily at bedtime as needed for anxiety or sleep.  She will return to see me in 70-month   Levonne Spiller, MD 09/21/2020, 10:58 AM

## 2020-09-26 ENCOUNTER — Ambulatory Visit (HOSPITAL_COMMUNITY): Payer: Medicare HMO

## 2020-09-26 ENCOUNTER — Ambulatory Visit (HOSPITAL_COMMUNITY): Admission: RE | Admit: 2020-09-26 | Payer: Medicare HMO | Source: Ambulatory Visit

## 2020-09-26 ENCOUNTER — Encounter (HOSPITAL_COMMUNITY): Payer: Self-pay

## 2020-09-26 NOTE — Progress Notes (Signed)
Cardiology Office Note    Date:  10/09/2020   ID:  EMBREE [name], DOB 1935/09/15, MRN [id number]   PCP:  [name], [place]  Cardiologist:  [name], MD  Advanced Practice Provider:  No care team member to display Electrophysiologist:  Thompson Grayer, MD   [phone]   No chief complaint on file.   History of Present Illness:  Amy [name] is a 85 y.o. female with history of systolic CHF EF 35 to 89% on echo 09/2019, Myoview 10/2019 EF 48% no ischemia, PAF A. fib burden 0.3% on ILR interrogation 07/15/2020, cryptogenic stroke, dementia.  Cardiomyopathy initially diagnosed 02/2019 when she presented with a stroke.  Because of dementia invasive evaluation for ischemia has been avoided.  Myoview 10/2019 no ischemia managed medically.  Patient had telemedicine visit with Richardson Dopp, PA-C 08/01/2020 for chronic leg edema and was started on low dose Lasix 20 mg Monday Wednesday Friday.  Plan to resume losartan if blood pressure was high enough.  CHA2DS2-VASc was 8 and the concern was about her dementia living alone and history of falling.  She has a sitter 5 days a week for 5 hours.  He deferred final decision to Dr. [name] who spoke with patient's son and she had had a fall before Thanksgiving and a second fall in December.  She recommended continuing aspirin and no anticoagulation  Patient added onto my schedule for preoperative clearance before undergoing spinal surgery by [facility].  ILR 09/14/2020 no arrhythmias or A. Fib. 07/2020 NSR with PAC's. Patient's son says they decided against having surgery. Patient tripped over wheelchair last night when letting dog in and fell-neighbor came to help her up.  She's had 2 falls this week and has leg swelling that has worsened. Taking lasix 3 times a week. Eats mostly processed foods and meals brought in. Has a sitter 5 days/week 9-2.  Past Medical History:  Diagnosis Date  . Anxiety   .  Arthritis   . Back pain   . Colon polyps   . Dementia ([place])    early signs per daughter in law  . Depression   . Diabetes mellitus, type II ([place])    diet controlled  . Hypercholesterolemia   . Invasive ductal carcinoma of breast, female, right ([place])   . Osteopenia 11/26/2016  . Skin-picking disorder    has 3 open wounds on right wrist that are being treated. About a quarter in size.  . Stroke [facility])     Past Surgical History:  Procedure Laterality Date  . APPENDECTOMY    . BACK SURGERY    . BREAST BIOPSY Right 10/09/2016   Procedure: BREAST BIOPSY WITH NEEDLE LOCALIZATION;  Surgeon: [name], MD;  Location: [facility] ORS;  Service: General;  Laterality: Right;  . BREAST SURGERY Right   . BUBBLE STUDY  03/23/2019   Procedure: BUBBLE STUDY;  Surgeon: [name], MD;  Location: [facility] ENDOSCOPY;  Service: Cardiovascular;;  . BUNIONECTOMY Bilateral   . FACIAL COSMETIC SURGERY     drooping eyelid and brow  . HERNIA REPAIR Right    Inguinal x2  . implantable loop recorder placement  10/21/2019    Medtronic Reveal Sylvia model LNQ [id number]) implanted by Dr [name] for cryptogenic stroke indication  . INGUINAL LYMPH NODE BIOPSY Left 03/10/2020   Procedure: LEFT INGUINAL LYMPH NODE BIOPSY;  Surgeon: [name], MD;  Location: [facility] ORS;  Service: General;  Laterality: Left;  .  KNEE ARTHROSCOPY Right   . SHOULDER SURGERY Right    rotator cuff repair and removal of bone spur  . TEE WITHOUT CARDIOVERSION N/A 03/23/2019   Procedure: TRANSESOPHAGEAL ECHOCARDIOGRAM (TEE);  Surgeon: [name], MD;  Location: [facility] ENDOSCOPY;  Service: Cardiovascular;  Laterality: N/A;  . TEMPOROMANDIBULAR JOINT SURGERY Right   . TOTAL KNEE ARTHROPLASTY Right 05/05/2018   Procedure: TOTAL KNEE ARTHROPLASTY;  Surgeon: [name], MD;  Location: [facility] ORS;  Service: Orthopedics;  Laterality: Right;    Current Medications: Current Meds  Medication Sig  . ALPRAZolam (XANAX) 0.5 MG tablet Take 1  tablet (0.5 mg total) by mouth at bedtime.  Marland Kitchen aspirin EC 81 MG tablet Take 81 mg by mouth every morning.  . calcium carbonate (OSCAL) 1500 (600 Ca) MG TABS tablet Take 600 mg of elemental calcium by mouth daily with supper.   . cholecalciferol (VITAMIN D) 1000 units tablet Take 1 tablet (1,000 Units total) by mouth daily.  Marland Kitchen donepezil (ARICEPT) 5 MG tablet Take 1 tablet (5 mg total) by mouth daily.  Marland Kitchen escitalopram (LEXAPRO) 20 MG tablet TAKE 1 TABLET(20 MG) BY MOUTH DAILY  . gabapentin (NEURONTIN) 300 MG capsule Take 300 mg by mouth 2 (two) times daily.  Marland Kitchen letrozole (FEMARA) 2.5 MG tablet TAKE 1 TABLET(2.5 MG) BY MOUTH DAILY  . metFORMIN (GLUCOPHAGE) 500 MG tablet Take 1 tablet (500 mg total) by mouth 2 (two) times daily with a meal.  . metoprolol succinate (TOPROL-XL) 25 MG 24 hr tablet Take 1 tablet (25 mg total) by mouth daily. Please keep upcoming appt in January 2022 before anymore refills. Thank you  . oxybutynin (DITROPAN) 5 MG tablet Take 5 mg by mouth at bedtime.  . risperiDONE (RISPERDAL) 0.5 MG tablet Take 1 tablet (0.5 mg total) by mouth at bedtime.  . traMADol (ULTRAM) 50 MG tablet Take 50 mg by mouth every 6 (six) hours as needed.  . venlafaxine XR (EFFEXOR-XR) 150 MG 24 hr capsule TAKE 1 CAPSULE(150 MG) BY MOUTH TWICE DAILY  . [DISCONTINUED] furosemide (LASIX) 20 MG tablet Take 1 tablet (20 mg total) by mouth 3 (three) times a week.     Allergies:   Cymbalta [duloxetine hcl] and Duloxetine   Social History   Socioeconomic History  . Marital status: Divorced    Spouse name: Not on file  . Number of children: Not on file  . Years of education: Not on file  . Highest education level: Not on file  Occupational History  . Occupation: Nurse    Comment: Retired  Tobacco Use  . Smoking status: Former Smoker    Packs/day: 0.50    Years: 40.00    Pack years: 20.00    Types: Cigarettes    Quit date: 09/04/1995    Years since quitting: 25.1  . Smokeless tobacco: Never Used   Vaping Use  . Vaping Use: Never used  Substance and Sexual Activity  . Alcohol use: No  . Drug use: No    Types: Benzodiazepines, Hydrocodone  . Sexual activity: Never  Other Topics Concern  . Not on file  Social History Narrative   Divorced.  Former smoker, quit in 1997.  Nondrinker.    Social Determinants of Health   Financial Resource Strain: Not on file  Food Insecurity: Not on file  Transportation Needs: Not on file  Physical Activity: Not on file  Stress: Not on file  Social Connections: Not on file     Family History:  The patient's family  history includes Alcohol abuse in her brother; Anxiety disorder in her mother; Cancer - Other in her sister; Depression in her maternal grandmother and mother.   ROS:   Please see the history of present illness.    ROS All other systems reviewed and are negative.   PHYSICAL EXAM:   VS:  BP 126/84   Pulse 67   Ht 5\' 4"  (1.626 m)   Wt 161 lb (73 kg)   SpO2 94%   BMI 27.64 kg/m   Physical Exam  GEN: Well nourished, well developed, in no acute distress  Neck: no JVD, carotid bruits, or masses Cardiac:RRR; no murmurs, rubs, or gallops  Respiratory:  clear to auscultation bilaterally, normal work of breathing GI: soft, nontender, nondistended, + BS Ext: plus 2 edema right, plus 1 on left, without cyanosis, clubbing, or edema, Good distal pulses bilaterally Neuro:  Alert and Oriented x 3 Psych: euthymic mood, full affect  Wt Readings from Last 3 Encounters:  10/09/20 161 lb (73 kg)  08/03/20 157 lb 11.2 oz (71.5 kg)  08/01/20 158 lb 9.6 oz (71.9 kg)      Studies/Labs Reviewed:   EKG:  EKG is not ordered today.    Recent Labs: 08/17/2020: ALT 11; Hemoglobin 12.8; Platelets 228 08/21/2020: BUN 14; Creatinine, Ser 0.85; NT-Pro BNP 120; Potassium 4.0; Sodium 143   Lipid Panel    Component Value Date/Time   CHOL 208 (H) 07/11/2020 0401   CHOL 172 05/06/2019 1451   TRIG 120 07/11/2020 0401   HDL 74 07/11/2020 0401   HDL  83 05/06/2019 1451   CHOLHDL 2.8 07/11/2020 0401   VLDL 24 07/11/2020 0401   LDLCALC 110 (H) 07/11/2020 0401   LDLCALC 57 05/06/2019 1451    Additional studies/ records that were reviewed today include:  NST 09/27/19 EF 48, apical thinning, no ischemia, low risk   Echocardiogram 09/27/19 EF 35-40, global HK, Gr 1 DD, normal RVSF, mild MR, trivial AI   Event monitor 06/2019 SInus rhythm, sinus tachycardia   Rates 61 to 131 bpm    Occasional PVC Symptoms of fatigue did not correlate with any signif arrhythmia or extreme heart rates   TEE 03/23/2019 EF 30-35, no RWMA, small effusion, small Patent Foramen Ovale,    Echocardiogram 03/21/2019 EF 35-40        Risk Assessment/Calculations:    CHA2DS2-VASc Score = 8  This indicates a 10.8% annual risk of stroke. The patient's score is based upon: CHF History: Yes HTN History: Yes Diabetes History: Yes Stroke History: Yes Vascular Disease History: No Age Score: 2 Gender Score: 1        ASSESSMENT:    1. Preoperative clearance   2. Paroxysmal atrial fibrillation (HCC)   3. NICM (nonischemic cardiomyopathy) ([place])   4. Acute on chronic combined systolic and diastolic CHF (congestive heart failure) ([place])   5. Cryptogenic stroke ([place])   6. Frequent falls      PLAN:  In order of problems listed above:  Preop clearance for L3-S1 posterior lateral fusion with S2 ALAR iliac fixation [facility]. Patient is no longer planning to have surgery.   PAF CHA2DS2-VASc equals 8 but not on anticoagulation due to frequent falls and dementia. LINQ report past 2 months have showed NSR with PAC's. 2 falls this past week.   Cardiomyopathy diagnosed 02/2020 presenting for a stroke.   Because of dementia no invasive evaluation.  NST no ischemia 10/2019.  Most recent echo 09/2019 LVEF 35 to 40%  started on low-dose Lasix last office visit. Still has a lot of swelling R>L. Will increase Lasix to 40 mg daily for 3 days then back  to 20 mg 5 days a week when she has a sitter with her.  Acute on chronic systolic and diastolic CHF-see above. Will also check labs and 2 gm sodium diet  Cryptogenic stroke  Frequent falls with to this week.  Discussed with patient and son need for possible placement which they are considering.  Patient usually falls when she is alone.  She has a sitter 5 days a week from 9 AM to 2 PM but otherwise is alone.  Shared Decision Making/Informed Consent        Medication Adjustments/Labs and Tests Ordered: Current medicines are reviewed at length with the patient today.  Concerns regarding medicines are outlined above.  Medication changes, Labs and Tests ordered today are listed in the Patient Instructions below. Patient Instructions   Medication Instructions:  INCREASE Furosemide to two 20 mg tablets twice a day for 3 days, then 1 20 mg tablet once a day 5 days a week.  *If you need a refill on your cardiac medications before your next appointment, please call your pharmacy*   Lab Work: BMET/BNP If you have labs (blood work) drawn today and your tests are completely normal, you will receive your results only by: Marland Kitchen MyChart Message (if you have MyChart) OR . A paper copy in the mail If you have any lab test that is abnormal or we need to change your treatment, we will call you to review the results.   Testing/Procedures: None   Follow-Up: At [facility], you and your health needs are our priority.  As part of our continuing mission to provide you with exceptional heart care, we have created designated Provider Care Teams.  These Care Teams include your primary Cardiologist (physician) and Advanced Practice Providers (APPs -  Physician Assistants and Nurse Practitioners) who all work together to provide you with the care you need, when you need it.  We recommend signing up for the patient portal called "MyChart".  Sign up information is provided on this After Visit Summary.  MyChart  is used to connect with patients for Virtual Visits (Telemedicine).  Patients are able to view lab/test results, encounter notes, upcoming appointments, etc.  Non-urgent messages can be sent to your provider as well.   To learn more about what you can do with MyChart, go to [email or web address].    Your next appointment:   3-4 week(s)  The format for your next appointment:   In Person  Provider:   You will see one of the following Advanced Practice Providers on your designated Care Team:    [place], PA-C   [name], [place]    Other Instructions   Two Gram Sodium Diet 2000 mg  What is Sodium? Sodium is a mineral found naturally in many foods. The most significant source of sodium in the diet is table salt, which is about 40% sodium.  Processed, convenience, and preserved foods also contain a large amount of sodium.  The body needs only 500 mg of sodium daily to function,  A normal diet provides more than enough sodium even if you do not use salt.  Why Limit Sodium? A build up of sodium in the body can cause thirst, increased blood pressure, shortness of breath, and water retention.  Decreasing sodium in the diet can reduce edema and risk of heart attack or stroke associated  with high blood pressure.  Keep in mind that there are many other factors involved in these health problems.  Heredity, obesity, lack of exercise, cigarette smoking, stress and what you eat all play a role.  General Guidelines:  Do not add salt at the table or in cooking.  One teaspoon of salt contains over 2 grams of sodium.  Read food labels  Avoid processed and convenience foods  Ask your dietitian before eating any foods not dicussed in the menu planning guidelines  Consult your physician if you wish to use a salt substitute or a sodium containing medication such as antacids.  Limit milk and milk products to 16 oz (2 cups) per day.  Shopping Hints:  READ LABELS!! "Dietetic" does not  necessarily mean low sodium.  Salt and other sodium ingredients are often added to foods during processing.   Menu Planning Guidelines Food Group Choose More Often Avoid  Beverages (see also the milk group All fruit juices, low-sodium, salt-free vegetables juices, low-sodium carbonated beverages Regular vegetable or tomato juices, commercially softened water used for drinking or cooking  Breads and Cereals Enriched white, wheat, rye and pumpernickel bread, hard rolls and dinner rolls; muffins, cornbread and waffles; most dry cereals, cooked cereal without added salt; unsalted crackers and breadsticks; low sodium or homemade bread crumbs Bread, rolls and crackers with salted tops; quick breads; instant hot cereals; pancakes; commercial bread stuffing; self-rising flower and biscuit mixes; regular bread crumbs or cracker crumbs  Desserts and Sweets Desserts and sweets mad with mild should be within allowance Instant pudding mixes and cake mixes  Fats Butter or margarine; vegetable oils; unsalted salad dressings, regular salad dressings limited to 1 Tbs; light, sour and heavy cream Regular salad dressings containing bacon fat, bacon bits, and salt pork; snack dips made with instant soup mixes or processed cheese; salted nuts  Fruits Most fresh, frozen and canned fruits Fruits processed with salt or sodium-containing ingredient (some dried fruits are processed with sodium sulfites        Vegetables Fresh, frozen vegetables and low- sodium canned vegetables Regular canned vegetables, sauerkraut, pickled vegetables, and others prepared in brine; frozen vegetables in sauces; vegetables seasoned with ham, bacon or salt pork  Condiments, Sauces, Miscellaneous  Salt substitute with physician's approval; pepper, herbs, spices; vinegar, lemon or lime juice; hot pepper sauce; garlic powder, onion powder, low sodium soy sauce (1 Tbs.); low sodium condiments (ketchup, chili sauce, mustard) in limited amounts (1  tsp.) fresh ground horseradish; unsalted tortilla chips, pretzels, potato chips, popcorn, salsa (1/4 cup) Any seasoning made with salt including garlic salt, celery salt, onion salt, and seasoned salt; sea salt, rock salt, kosher salt; meat tenderizers; monosodium glutamate; mustard, regular soy sauce, barbecue, sauce, chili sauce, teriyaki sauce, steak sauce, Worcestershire sauce, and most flavored vinegars; canned gravy and mixes; regular condiments; salted snack foods, olives, picles, relish, horseradish sauce, catsup   Food preparation: Try these seasonings Meats:    Pork Sage, onion Serve with applesauce  Chicken Poultry seasoning, thyme, parsley Serve with cranberry sauce  Lamb Curry powder, rosemary, garlic, thyme Serve with mint sauce or jelly  Veal Marjoram, basil Serve with current jelly, cranberry sauce  Beef Pepper, bay leaf Serve with dry mustard, unsalted chive butter  Fish [place] leaf, dill Serve with unsalted lemon butter, unsalted parsley butter  Vegetables:    Asparagus Lemon juice   Broccoli Lemon juice   Carrots Mustard dressing parsley, mint, nutmeg, glazed with unsalted butter and sugar   Green beans  Marjoram, lemon juice, nutmeg,dill seed   Tomatoes Basil, marjoram, onion   Spice /blend for [employer]" 4 tsp ground thyme 1 tsp ground sage 3 tsp ground rosemary 4 tsp ground marjoram   Test your knowledge 1. A product that says "Salt Free" may still contain sodium. True or False 2. Garlic Powder and Hot Pepper Sauce an be used as alternative seasonings.True or False 3. Processed foods have more sodium than fresh foods.  True or False 4. Canned Vegetables have less sodium than froze True or False  WAYS TO DECREASE YOUR SODIUM INTAKE 1. Avoid the use of added salt in cooking and at the table.  Table salt (and other prepared seasonings which contain salt) is probably one of the greatest sources of sodium in the diet.  Unsalted foods can gain flavor from the sweet, sour,  and butter taste sensations of herbs and spices.  Instead of using salt for seasoning, try the following seasonings with the foods listed.  Remember: how you use them to enhance natural food flavors is limited only by your creativity... Allspice-Meat, fish, eggs, fruit, peas, red and yellow vegetables Almond Extract-Fruit baked goods Anise Seed-Sweet breads, fruit, carrots, beets, cottage cheese, cookies (tastes like licorice) Basil-Meat, fish, eggs, vegetables, rice, vegetables salads, soups, sauces Bay Leaf-Meat, fish, stews, poultry Burnet-Salad, vegetables (cucumber-like flavor) Caraway Seed-Bread, cookies, cottage cheese, meat, vegetables, cheese, rice Cardamon-Baked goods, fruit, soups Celery Powder or seed-Salads, salad dressings, sauces, meatloaf, soup, bread.Do not use  celery salt Chervil-Meats, salads, fish, eggs, vegetables, cottage cheese (parsley-like flavor) Chili Power-Meatloaf, chicken cheese, corn, eggplant, egg dishes Chives-Salads cottage cheese, egg dishes, soups, vegetables, sauces Cilantro-Salsa, casseroles Cinnamon-Baked goods, fruit, pork, lamb, chicken, carrots Cloves-Fruit, baked goods, fish, pot roast, green beans, beets, carrots Coriander-Pastry, cookies, meat, salads, cheese (lemon-orange flavor) Cumin-Meatloaf, fish,cheese, eggs, cabbage,fruit pie (caraway flavor) [employer], fruit, eggs, fish, poultry, cottage cheese, vegetables Dill Seed-Meat, cottage cheese, poultry, vegetables, fish, salads, bread Fennel Seed-Bread, cookies, apples, pork, eggs, fish, beets, cabbage, cheese, Licorice-like flavor Garlic-(buds or powder) Salads, meat, poultry, fish, bread, butter, vegetables, potatoes.Do not  use garlic salt Ginger-Fruit, vegetables, baked goods, meat, fish, poultry Horseradish Root-Meet, vegetables, butter Lemon Juice or Extract-Vegetables, fruit, tea, baked goods, fish salads Mace-Baked goods fruit, vegetables, fish, poultry (taste like nutmeg) Maple  Extract-Syrups Marjoram-Meat, chicken, fish, vegetables, breads, green salads (taste like Sage) Mint-Tea, lamb, sherbet, vegetables, desserts, carrots, cabbage Mustard, Dry or Seed-Cheese, eggs, meats, vegetables, poultry Nutmeg-Baked goods, fruit, chicken, eggs, vegetables, desserts Onion Powder-Meat, fish, poultry, vegetables, cheese, eggs, bread, rice salads (Do not use   Onion salt) Orange Extract-Desserts, baked goods Oregano-Pasta, eggs, cheese, onions, pork, lamb, fish, chicken, vegetables, green salads Paprika-Meat, fish, poultry, eggs, cheese, vegetables Parsley Flakes-Butter, vegetables, meat fish, poultry, eggs, bread, salads (certain forms may   Contain sodium Pepper-Meat fish, poultry, vegetables, eggs Peppermint Extract-Desserts, baked goods Poppy Seed-Eggs, bread, cheese, fruit dressings, baked goods, noodles, vegetables, cottage  [employer], poultry, meat, fish, cauliflower, turnips,eggs bread Saffron-Rice, bread, veal, chicken, fish, eggs Sage-Meat, fish, poultry, onions, eggplant, tomateos, pork, stews Savory-Eggs, salads, poultry, meat, rice, vegetables, soups, pork Tarragon-Meat, poultry, fish, eggs, butter, vegetables (licorice-like flavor)  Thyme-Meat, poultry, fish, eggs, vegetables, (clover-like flavor), sauces, soups Tumeric-Salads, butter, eggs, fish, rice, vegetables (saffron-like flavor) Vanilla Extract-Baked goods, candy Vinegar-Salads, vegetables, meat marinades Walnut Extract-baked goods, candy  2. Choose your Foods Wisely   The following is a list of foods to avoid which are high in sodium:  Meats-Avoid all smoked, canned, salt cured, dried  and kosher meat and fish as well as Anchovies   [employer] meats:Bologna, Liverwurst, Pastrami Canned meat or fish  Marinated herring Caviar    Pepperoni Corned Beef   Pizza Dried chipped beef  Salami Frozen breaded fish or meat Salt pork Frankfurters or hot  dogs  Sardines Gefilte fish   Sausage Ham (boiled ham, Proscuitto Smoked butt    spiced ham)   Spam      TV Dinners Vegetables Canned vegetables (Regular) Relish Canned mushrooms  Sauerkraut Olives    Tomato juice Pickles  Bakery and Dessert Products Canned puddings  Cream pies Cheesecake   Decorated cakes Cookies  Beverages/Juices Tomato juice, regular  Gatorade   V-8 vegetable juice, regular  Breads and Cereals Biscuit mixes   Salted potato chips, corn chips, pretzels Bread stuffing mixes  Salted crackers and rolls Pancake and waffle mixes Self-rising flour  Seasonings Accent    Meat sauces Barbecue sauce  Meat tenderizer Catsup    Monosodium glutamate (MSG) Celery salt   Onion salt Chili sauce   Prepared mustard Garlic salt   Salt, seasoned salt, sea salt Gravy mixes   Soy sauce Horseradish   Steak sauce Ketchup   Tartar sauce Lite salt    Teriyaki sauce Marinade mixes   Worcestershire sauce  Others Baking powder   Cocoa and cocoa mixes Baking soda   Commercial casserole mixes Candy-caramels, chocolate  Dehydrated soups    Bars, fudge,nougats  Instant rice and pasta mixes Canned broth or soup  Maraschino cherries Cheese, aged and processed cheese and cheese spreads  Learning Assessment Quiz  Indicated T (for True) or F (for False) for each of the following statements:  1. _____ Fresh fruits and vegetables and unprocessed grains are generally low in sodium 2. _____ Water may contain a considerable amount of sodium, depending on the source 3. _____ You can always tell if a food is high in sodium by tasting it 4. _____ Certain laxatives my be high in sodium and should be avoided unless prescribed   by a physician or pharmacist 5. _____ Salt substitutes may be used freely by anyone on a sodium restricted diet 6. _____ Sodium is present in table salt, food additives and as a natural component of   most foods 7. _____ Table salt is approximately 90%  sodium 8. _____ Limiting sodium intake may help prevent excess fluid accumulation in the body 9. _____ On a sodium-restricted diet, seasonings such as bouillon soy sauce, and    cooking wine should be used in place of table salt 10. _____ On an ingredient list, a product which lists monosodium glutamate as the first   ingredient is an appropriate food to include on a low sodium diet  Circle the best answer(s) to the following statements (Hint: there may be more than one correct answer)  11. On a low-sodium diet, some acceptable snack items are:    A. Olives  F. Bean dip   K. Grapefruit juice    B. Salted Pretzels G. Commercial Popcorn   L. Canned peaches    C. Carrot Sticks  H. Bouillon   M. Unsalted nuts   D. [place] fries  I. Peanut butter crackers N. Salami   E. Sweet pickles J. Tomato Juice   O. Pizza  12.  Seasonings that may be used freely on a reduced - sodium diet include   A. Lemon wedges F.Monosodium glutamate K. Celery seed    B.Soysauce   G.  Pepper   L. Mustard powder   C. Sea salt  H. Cooking wine  M. Onion flakes   D. Vinegar  E. Prepared horseradish N. Salsa   E. Sage   J. Worcestershire sauce  O. Chutney      Signed, [name], PA-C  10/09/2020 1:43 PM    [facility] [address] [place], [place], [place]  [address] Phone: [phone]; Fax: [phone]

## 2020-10-07 ENCOUNTER — Other Ambulatory Visit (HOSPITAL_COMMUNITY): Payer: Self-pay | Admitting: Psychiatry

## 2020-10-09 ENCOUNTER — Ambulatory Visit: Payer: Medicare HMO | Admitting: Physician Assistant

## 2020-10-09 ENCOUNTER — Other Ambulatory Visit (HOSPITAL_COMMUNITY)
Admission: RE | Admit: 2020-10-09 | Discharge: 2020-10-09 | Disposition: A | Discharge status: Home / Self Care | Payer: Medicare HMO | Source: Ambulatory Visit | Attending: Physician Assistant | Admitting: Physician Assistant

## 2020-10-09 ENCOUNTER — Telehealth: Payer: Self-pay

## 2020-10-09 ENCOUNTER — Other Ambulatory Visit: Payer: Self-pay

## 2020-10-09 ENCOUNTER — Encounter: Payer: Self-pay | Admitting: Physician Assistant

## 2020-10-09 VITALS — BP 126/84 | HR 67 | Ht 64.0 in | Wt 161.0 lb

## 2020-10-09 DIAGNOSIS — I428 Other cardiomyopathies: Secondary | ICD-10-CM | POA: Insufficient documentation

## 2020-10-09 DIAGNOSIS — I639 Cerebral infarction, unspecified: Secondary | ICD-10-CM

## 2020-10-09 DIAGNOSIS — I480 Paroxysmal atrial fibrillation: Secondary | ICD-10-CM | POA: Diagnosis not present

## 2020-10-09 DIAGNOSIS — Z01818 Encounter for other preprocedural examination: Secondary | ICD-10-CM

## 2020-10-09 DIAGNOSIS — I5043 Acute on chronic combined systolic (congestive) and diastolic (congestive) heart failure: Secondary | ICD-10-CM

## 2020-10-09 DIAGNOSIS — R296 Repeated falls: Secondary | ICD-10-CM

## 2020-10-09 LAB — BRAIN NATRIURETIC PEPTIDE: B Natriuretic Peptide: 31 pg/mL (ref 0.0–100.0)

## 2020-10-09 LAB — BASIC METABOLIC PANEL
Anion gap: 10 (ref 5–15)
BUN: 24 mg/dL — ABNORMAL HIGH (ref 8–23)
CO2: 28 mmol/L (ref 22–32)
Calcium: 9 mg/dL (ref 8.9–10.3)
Chloride: 100 mmol/L (ref 98–111)
Creatinine, Ser: 0.97 mg/dL (ref 0.44–1.00)
GFR, Estimated: 58 mL/min — ABNORMAL LOW (ref >60)
Glucose, Bld: 80 mg/dL (ref 70–99)
Potassium: 4.5 mmol/L (ref 3.5–5.1)
Sodium: 138 mmol/L (ref 135–145)

## 2020-10-09 MED ORDER — FUROSEMIDE 20 MG PO TABS: ORAL_TABLET | ORAL | 3 refills | Status: DC

## 2020-10-09 NOTE — Telephone Encounter (Signed)
Spoke to patients son. He verbalized understanding and had no questions or concerns at this time.

## 2020-10-09 NOTE — Telephone Encounter (Signed)
-----   Message from [name], PA-C sent at 10/09/2020  4:03 PM EDT ----- Kidney function stable. Continue current plan. thanks

## 2020-10-09 NOTE — Patient Instructions (Signed)
Medication Instructions:  INCREASE Furosemide to two 20 mg tablets twice a day for 3 days, then 1 20 mg tablet once a day 5 days a week.  *If you need a refill on your cardiac medications before your next appointment, please call your pharmacy*   Lab Work: BMET/BNP If you have labs (blood work) drawn today and your tests are completely normal, you will receive your results only by: Marland Kitchen MyChart Message (if you have MyChart) OR . A paper copy in the mail If you have any lab test that is abnormal or we need to change your treatment, we will call you to review the results.   Testing/Procedures: None   Follow-Up: At [facility], you and your health needs are our priority.  As part of our continuing mission to provide you with exceptional heart care, we have created designated Provider Care Teams.  These Care Teams include your primary Cardiologist (physician) and Advanced Practice Providers (APPs -  Physician Assistants and Nurse Practitioners) who all work together to provide you with the care you need, when you need it.  We recommend signing up for the patient portal called "MyChart".  Sign up information is provided on this After Visit Summary.  MyChart is used to connect with patients for Virtual Visits (Telemedicine).  Patients are able to view lab/test results, encounter notes, upcoming appointments, etc.  Non-urgent messages can be sent to your provider as well.   To learn more about what you can do with MyChart, go to [email or web address].    Your next appointment:   3-4 week(s)  The format for your next appointment:   In Person  Provider:   You will see one of the following Advanced Practice Providers on your designated Care Team:    [place], PA-C   [name], [place]    Other Instructions   Two Gram Sodium Diet 2000 mg  What is Sodium? Sodium is a mineral found naturally in many foods. The most significant source of sodium in the diet is table salt, which  is about 40% sodium.  Processed, convenience, and preserved foods also contain a large amount of sodium.  The body needs only 500 mg of sodium daily to function,  A normal diet provides more than enough sodium even if you do not use salt.  Why Limit Sodium? A build up of sodium in the body can cause thirst, increased blood pressure, shortness of breath, and water retention.  Decreasing sodium in the diet can reduce edema and risk of heart attack or stroke associated with high blood pressure.  Keep in mind that there are many other factors involved in these health problems.  Heredity, obesity, lack of exercise, cigarette smoking, stress and what you eat all play a role.  General Guidelines:  Do not add salt at the table or in cooking.  One teaspoon of salt contains over 2 grams of sodium.  Read food labels  Avoid processed and convenience foods  Ask your dietitian before eating any foods not dicussed in the menu planning guidelines  Consult your physician if you wish to use a salt substitute or a sodium containing medication such as antacids.  Limit milk and milk products to 16 oz (2 cups) per day.  Shopping Hints:  READ LABELS!! "Dietetic" does not necessarily mean low sodium.  Salt and other sodium ingredients are often added to foods during processing.   Menu Planning Guidelines Food Group Choose More Often Avoid  Beverages (see also the milk group  All fruit juices, low-sodium, salt-free vegetables juices, low-sodium carbonated beverages Regular vegetable or tomato juices, commercially softened water used for drinking or cooking  Breads and Cereals Enriched white, wheat, rye and pumpernickel bread, hard rolls and dinner rolls; muffins, cornbread and waffles; most dry cereals, cooked cereal without added salt; unsalted crackers and breadsticks; low sodium or homemade bread crumbs Bread, rolls and crackers with salted tops; quick breads; instant hot cereals; pancakes; commercial bread  stuffing; self-rising flower and biscuit mixes; regular bread crumbs or cracker crumbs  Desserts and Sweets Desserts and sweets mad with mild should be within allowance Instant pudding mixes and cake mixes  Fats Butter or margarine; vegetable oils; unsalted salad dressings, regular salad dressings limited to 1 Tbs; light, sour and heavy cream Regular salad dressings containing bacon fat, bacon bits, and salt pork; snack dips made with instant soup mixes or processed cheese; salted nuts  Fruits Most fresh, frozen and canned fruits Fruits processed with salt or sodium-containing ingredient (some dried fruits are processed with sodium sulfites        Vegetables Fresh, frozen vegetables and low- sodium canned vegetables Regular canned vegetables, sauerkraut, pickled vegetables, and others prepared in brine; frozen vegetables in sauces; vegetables seasoned with ham, bacon or salt pork  Condiments, Sauces, Miscellaneous  Salt substitute with physician's approval; pepper, herbs, spices; vinegar, lemon or lime juice; hot pepper sauce; garlic powder, onion powder, low sodium soy sauce (1 Tbs.); low sodium condiments (ketchup, chili sauce, mustard) in limited amounts (1 tsp.) fresh ground horseradish; unsalted tortilla chips, pretzels, potato chips, popcorn, salsa (1/4 cup) Any seasoning made with salt including garlic salt, celery salt, onion salt, and seasoned salt; sea salt, rock salt, kosher salt; meat tenderizers; monosodium glutamate; mustard, regular soy sauce, barbecue, sauce, chili sauce, teriyaki sauce, steak sauce, Worcestershire sauce, and most flavored vinegars; canned gravy and mixes; regular condiments; salted snack foods, olives, picles, relish, horseradish sauce, catsup   Food preparation: Try these seasonings Meats:    Pork Sage, onion Serve with applesauce  Chicken Poultry seasoning, thyme, parsley Serve with cranberry sauce  Lamb Curry powder, rosemary, garlic, thyme Serve with mint sauce  or jelly  Veal Marjoram, basil Serve with current jelly, cranberry sauce  Beef Pepper, bay leaf Serve with dry mustard, unsalted chive butter  Fish [place] leaf, dill Serve with unsalted lemon butter, unsalted parsley butter  Vegetables:    Asparagus Lemon juice   Broccoli Lemon juice   Carrots Mustard dressing parsley, mint, nutmeg, glazed with unsalted butter and sugar   Green beans Marjoram, lemon juice, nutmeg,dill seed   Tomatoes Basil, marjoram, onion   Spice /blend for [employer]" 4 tsp ground thyme 1 tsp ground sage 3 tsp ground rosemary 4 tsp ground marjoram   Test your knowledge 1. A product that says "Salt Free" may still contain sodium. True or False 2. Garlic Powder and Hot Pepper Sauce an be used as alternative seasonings.True or False 3. Processed foods have more sodium than fresh foods.  True or False 4. Canned Vegetables have less sodium than froze True or False  WAYS TO DECREASE YOUR SODIUM INTAKE 1. Avoid the use of added salt in cooking and at the table.  Table salt (and other prepared seasonings which contain salt) is probably one of the greatest sources of sodium in the diet.  Unsalted foods can gain flavor from the sweet, sour, and butter taste sensations of herbs and spices.  Instead of using salt for seasoning, try the following seasonings  with the foods listed.  Remember: how you use them to enhance natural food flavors is limited only by your creativity... Allspice-Meat, fish, eggs, fruit, peas, red and yellow vegetables Almond Extract-Fruit baked goods Anise Seed-Sweet breads, fruit, carrots, beets, cottage cheese, cookies (tastes like licorice) Basil-Meat, fish, eggs, vegetables, rice, vegetables salads, soups, sauces Bay Leaf-Meat, fish, stews, poultry Burnet-Salad, vegetables (cucumber-like flavor) Caraway Seed-Bread, cookies, cottage cheese, meat, vegetables, cheese, rice Cardamon-Baked goods, fruit, soups Celery Powder or seed-Salads, salad dressings,  sauces, meatloaf, soup, bread.Do not use  celery salt Chervil-Meats, salads, fish, eggs, vegetables, cottage cheese (parsley-like flavor) Chili Power-Meatloaf, chicken cheese, corn, eggplant, egg dishes Chives-Salads cottage cheese, egg dishes, soups, vegetables, sauces Cilantro-Salsa, casseroles Cinnamon-Baked goods, fruit, pork, lamb, chicken, carrots Cloves-Fruit, baked goods, fish, pot roast, green beans, beets, carrots Coriander-Pastry, cookies, meat, salads, cheese (lemon-orange flavor) Cumin-Meatloaf, fish,cheese, eggs, cabbage,fruit pie (caraway flavor) [employer], fruit, eggs, fish, poultry, cottage cheese, vegetables Dill Seed-Meat, cottage cheese, poultry, vegetables, fish, salads, bread Fennel Seed-Bread, cookies, apples, pork, eggs, fish, beets, cabbage, cheese, Licorice-like flavor Garlic-(buds or powder) Salads, meat, poultry, fish, bread, butter, vegetables, potatoes.Do not  use garlic salt Ginger-Fruit, vegetables, baked goods, meat, fish, poultry Horseradish Root-Meet, vegetables, butter Lemon Juice or Extract-Vegetables, fruit, tea, baked goods, fish salads Mace-Baked goods fruit, vegetables, fish, poultry (taste like nutmeg) Maple Extract-Syrups Marjoram-Meat, chicken, fish, vegetables, breads, green salads (taste like Sage) Mint-Tea, lamb, sherbet, vegetables, desserts, carrots, cabbage Mustard, Dry or Seed-Cheese, eggs, meats, vegetables, poultry Nutmeg-Baked goods, fruit, chicken, eggs, vegetables, desserts Onion Powder-Meat, fish, poultry, vegetables, cheese, eggs, bread, rice salads (Do not use   Onion salt) Orange Extract-Desserts, baked goods Oregano-Pasta, eggs, cheese, onions, pork, lamb, fish, chicken, vegetables, green salads Paprika-Meat, fish, poultry, eggs, cheese, vegetables Parsley Flakes-Butter, vegetables, meat fish, poultry, eggs, bread, salads (certain forms may   Contain sodium Pepper-Meat fish, poultry, vegetables, eggs Peppermint  Extract-Desserts, baked goods Poppy Seed-Eggs, bread, cheese, fruit dressings, baked goods, noodles, vegetables, cottage  [employer], poultry, meat, fish, cauliflower, turnips,eggs bread Saffron-Rice, bread, veal, chicken, fish, eggs Sage-Meat, fish, poultry, onions, eggplant, tomateos, pork, stews Savory-Eggs, salads, poultry, meat, rice, vegetables, soups, pork Tarragon-Meat, poultry, fish, eggs, butter, vegetables (licorice-like flavor)  Thyme-Meat, poultry, fish, eggs, vegetables, (clover-like flavor), sauces, soups Tumeric-Salads, butter, eggs, fish, rice, vegetables (saffron-like flavor) Vanilla Extract-Baked goods, candy Vinegar-Salads, vegetables, meat marinades Walnut Extract-baked goods, candy  2. Choose your Foods Wisely   The following is a list of foods to avoid which are high in sodium:  Meats-Avoid all smoked, canned, salt cured, dried and kosher meat and fish as well as Anchovies   [employer] meats:Bologna, Liverwurst, Pastrami Canned meat or fish  Marinated herring Caviar    Pepperoni Corned Beef   Pizza Dried chipped beef  Salami Frozen breaded fish or meat Salt pork Frankfurters or hot dogs  Sardines Gefilte fish   Sausage Ham (boiled ham, Proscuitto Smoked butt    spiced ham)   Spam      TV Dinners Vegetables Canned vegetables (Regular) Relish Canned mushrooms  Sauerkraut Olives    Tomato juice Pickles  Bakery and Dessert Products Canned puddings  Cream pies Cheesecake   Decorated cakes Cookies  Beverages/Juices Tomato juice, regular  Gatorade   V-8 vegetable juice, regular  Breads and Cereals Biscuit mixes   Salted potato chips, corn chips, pretzels Bread stuffing mixes  Salted crackers and rolls Pancake and waffle mixes Self-rising flour  Seasonings Accent    Meat sauces Barbecue  sauce  Meat tenderizer Catsup    Monosodium glutamate (MSG) Celery salt   Onion salt Chili sauce   Prepared  mustard Garlic salt   Salt, seasoned salt, sea salt Gravy mixes   Soy sauce Horseradish   Steak sauce Ketchup   Tartar sauce Lite salt    Teriyaki sauce Marinade mixes   Worcestershire sauce  Others Baking powder   Cocoa and cocoa mixes Baking soda   Commercial casserole mixes Candy-caramels, chocolate  Dehydrated soups    Bars, fudge,nougats  Instant rice and pasta mixes Canned broth or soup  Maraschino cherries Cheese, aged and processed cheese and cheese spreads  Learning Assessment Quiz  Indicated T (for True) or F (for False) for each of the following statements:  1. _____ Fresh fruits and vegetables and unprocessed grains are generally low in sodium 2. _____ Water may contain a considerable amount of sodium, depending on the source 3. _____ You can always tell if a food is high in sodium by tasting it 4. _____ Certain laxatives my be high in sodium and should be avoided unless prescribed   by a physician or pharmacist 5. _____ Salt substitutes may be used freely by anyone on a sodium restricted diet 6. _____ Sodium is present in table salt, food additives and as a natural component of   most foods 7. _____ Table salt is approximately 90% sodium 8. _____ Limiting sodium intake may help prevent excess fluid accumulation in the body 9. _____ On a sodium-restricted diet, seasonings such as bouillon soy sauce, and    cooking wine should be used in place of table salt 10. _____ On an ingredient list, a product which lists monosodium glutamate as the first   ingredient is an appropriate food to include on a low sodium diet  Circle the best answer(s) to the following statements (Hint: there may be more than one correct answer)  11. On a low-sodium diet, some acceptable snack items are:    A. Olives  F. Bean dip   K. Grapefruit juice    B. Salted Pretzels G. Commercial Popcorn   L. Canned peaches    C. Carrot Sticks  H. Bouillon   M. Unsalted nuts   D. [place] fries  I. Peanut  butter crackers N. Salami   E. Sweet pickles J. Tomato Juice   O. Pizza  12.  Seasonings that may be used freely on a reduced - sodium diet include   A. Lemon wedges F.Monosodium glutamate K. Celery seed    B.Soysauce   G. Pepper   L. Mustard powder   C. Sea salt  H. Cooking wine  M. Onion flakes   D. Vinegar  E. Prepared horseradish N. Salsa   E. Sage   J. Worcestershire sauce  O. Chutney

## 2020-10-16 ENCOUNTER — Ambulatory Visit (INDEPENDENT_AMBULATORY_CARE_PROVIDER_SITE_OTHER): Payer: Medicare HMO

## 2020-10-16 DIAGNOSIS — I480 Paroxysmal atrial fibrillation: Secondary | ICD-10-CM

## 2020-10-22 LAB — CUP PACEART REMOTE DEVICE CHECK
Date Time Interrogation Session: 20220327031057
Implantable Pulse Generator Implant Date: 20210325

## 2020-10-24 ENCOUNTER — Ambulatory Visit: Payer: Medicare HMO | Admitting: Neurology

## 2020-10-24 NOTE — Progress Notes (Signed)
[facility] Summary Report / Loop Recorder 

## 2020-10-31 ENCOUNTER — Telehealth (HOSPITAL_COMMUNITY): Payer: Self-pay | Admitting: Psychiatry

## 2020-10-31 NOTE — Telephone Encounter (Signed)
Called to schedule f/u appt, left vm 

## 2020-11-06 NOTE — Progress Notes (Deleted)
Cardiology Office Note    Date:  11/06/2020   ID:  Amy [name], DOB 1935/10/10, MRN [id number]   PCP:  [name], [place]  Cardiologist:  [name], MD *** Advanced Practice Provider:  No care team member to display Electrophysiologist:  Thompson Grayer, MD   [phone]   No chief complaint on file.   History of Present Illness:  Amy [name] is a 85 y.o. female with history of systolic CHF EF 35 to 56% on echo 09/2019, Myoview 10/2019 EF 48% no ischemia, PAF A. fib burden 0.3% on ILR interrogation 07/15/2020, cryptogenic stroke, dementia.  Cardiomyopathy initially diagnosed 02/2019 when she presented with a stroke.  Because of dementia invasive evaluation for ischemia has been avoided.  Myoview 10/2019 no ischemia managed medically.  Patient had telemedicine visit with Amy Dopp, PA-C 08/01/2020 for chronic leg edema and was started on low dose Lasix 20 mg Monday Wednesday Friday.  Plan to resume losartan if blood pressure was high enough.  CHA2DS2-VASc was 8 and the concern was about her dementia living alone and history of falling.  She has a sitter 5 days a week for 5 hours.  He deferred final decision to Amy. [name] who spoke with patient's son and she had had a fall before Thanksgiving and a second fall in December.  She recommended continuing aspirin and no anticoagulation    I saw patient 10/09/20 and she had 2 falls that week, increased edema, eating processed food ands taking lasix 3 times/week. I increased lasix 40 mg daily for 3 days then 20 mg 5 days a week when she has a sitter with her because of fall risk.       Past Medical History:  Diagnosis Date  . Anxiety   . Arthritis   . Back pain   . Colon polyps   . Dementia ([place])    early [name] per daughter in law  . Depression   . Diabetes mellitus, type II ([place])    diet controlled  . Hypercholesterolemia   . Invasive ductal carcinoma of breast, female, right ([place])   .  Osteopenia 11/26/2016  . Skin-picking disorder    has 3 open wounds on right wrist that are being treated. About a quarter in size.  . Stroke Amy [name])     Past Surgical History:  Procedure Laterality Date  . APPENDECTOMY    . BACK SURGERY    . BREAST BIOPSY Right 10/09/2016   Procedure: BREAST BIOPSY WITH NEEDLE LOCALIZATION;  Surgeon: Amy Signs, MD;  Location: [facility];  Service: General;  Laterality: Right;  . BREAST SURGERY Right   . BUBBLE STUDY  03/23/2019   Procedure: BUBBLE STUDY;  Surgeon: Amy Artist, MD;  Location: [name] END[facility]COPY;  Service: Cardiovascular;;  . BUNIONECTOMY Bilateral   . FACIAL COSMETIC SURGERY     drooping eyelid and brow  . HERNIA REPAIR Right    Inguinal x2  . implantable loop recorder placement  10/21/2019    Medtronic Reveal [place] model LNQ [id number] S) implanted by Amy [name] for cryptogenic stroke indication  . INGUINAL LYMPH NODE BIOPSY Left 03/10/2020   Procedure: LEFT INGUINAL LYMPH NODE BIOPSY;  Surgeon: Amy Signs, MD;  Location: [facility];  Service: General;  Laterality: Left;  . KNEE ARTHROSCOPY Right   . SHOULDER SURGERY Right    rotator cuff repair and removal of bone spur  . TEE WITHOUT CARDIOVERSION N/A 03/23/2019   Procedure: TRANSESOPHAGEAL ECHOCARDIOGRAM (TEE);  Surgeon: Amy Artist, MD;  Location: [facility] ENDOSCOPY;  Service: Cardiovascular;  Laterality: N/A;  . TEMPOROMANDIBULAR JOINT SURGERY Right   . TOTAL KNEE ARTHROPLASTY Right 05/05/2018   Procedure: TOTAL KNEE ARTHROPLASTY;  Surgeon: Amy Civil, MD;  Location: [facility];  Service: Orthopedics;  Laterality: Right;    Current Medications: No outpatient medications have been marked as taking for the 11/13/20 encounter (Appointment) with Amy Burn, PA-C.     Allergies:   Cymbalta [duloxetine hcl] and Duloxetine   Social History   Socioeconomic History  . Marital status: Divorced    Spouse name: Not on file  . Number of children: Not on file  . Years of  education: Not on file  . Highest education level: Not on file  Occupational History  . Occupation: Nurse    Comment: Retired  Tobacco Use  . Smoking status: Former Smoker    Packs/day: 0.50    Years: 40.00    Pack years: 20.00    Types: Cigarettes    Quit date: 09/04/1995    Years since quitting: 25.1  . Smokeless tobacco: Never Used  Vaping Use  . Vaping Use: Never used  Substance and Sexual Activity  . Alcohol use: No  . Drug use: No    Types: Benzodiazepines, Hydrocodone  . Sexual activity: Never  Other Topics Concern  . Not on file  Social History Narrative   Divorced.  Former smoker, quit in 1997.  Nondrinker.    Social Determinants of Health   Financial Resource Strain: Not on file  Food Insecurity: Not on file  Transportation Needs: Not on file  Physical Activity: Not on file  Stress: Not on file  Social Connections: Not on file     Family History:  The patient's ***family history includes Alcohol abuse in her brother; Anxiety disorder in her mother; Cancer - Other in her sister; Depression in her maternal grandmother and mother.   ROS:   Please see the history of present illness.    ROS All other systems reviewed and are negative.   PHYSICAL EXAM:   VS:  There were no vitals taken for this visit.  Physical Exam  GEN: Well nourished, well developed, in no acute distress  HEENT: normal  Neck: no JVD, carotid bruits, or masses Cardiac:RRR; no murmurs, rubs, or gallops  Respiratory:  clear to auscultation bilaterally, normal work of breathing GI: soft, nontender, nondistended, + BS Ext: without cyanosis, clubbing, or edema, Good distal pulses bilaterally MS: no deformity or atrophy  Skin: warm and dry, no rash Neuro:  Alert and Oriented x 3, Strength and sensation are intact Psych: euthymic mood, full affect  Wt Readings from Last 3 Encounters:  10/09/20 161 lb (73 kg)  08/03/20 157 lb 11.2 oz (71.5 kg)  08/01/20 158 lb 9.6 oz (71.9 kg)       Studies/Labs Reviewed:   EKG:  EKG is*** ordered today.  The ekg ordered today demonstrates ***  Recent Labs: 08/17/2020: ALT 11; Hemoglobin 12.8; Platelets 228 08/21/2020: NT-Pro BNP 120 10/09/2020: B Natriuretic Peptide 31.0; BUN 24; Creatinine, Ser 0.97; Potassium 4.5; Sodium 138   Lipid Panel    Component Value Date/Time   CHOL 208 (H) 07/11/2020 0401   CHOL 172 05/06/2019 1451   TRIG 120 07/11/2020 0401   HDL 74 07/11/2020 0401   HDL 83 05/06/2019 1451   CHOLHDL 2.8 07/11/2020 0401   VLDL 24 07/11/2020 0401   LDLCALC 110 (H) 07/11/2020 0401   LDLCALC 57  05/06/2019 1451    Additional studies/ records that were reviewed today include:  NST 09/27/19 EF 48, apical thinning, no ischemia, low risk   Echocardiogram 09/27/19 EF 35-40, global HK, Gr 1 DD, normal RVSF, mild MR, trivial AI   Event monitor 06/2019 SInus rhythm, sinus tachycardia   Rates 61 to 131 bpm    Occasional PVC Symptoms of fatigue did not correlate with any signif arrhythmia or extreme heart rates   TEE 03/23/2019 EF 30-35, no RWMA, small effusion, small Patent Foramen Ovale,    Echocardiogram 03/21/2019 EF 35-40          Risk Assessment/Calculations:   {Does this patient have ATRIAL FIBRILLATION?:[id number]}     ASSESSMENT:    No diagnosis found.   PLAN:  In order of problems listed above:   PAF CHA2DS2-VASc equals 8 but not on anticoagulation due to frequent falls and dementia. LINQ report past 2 months have showed NSR with PAC's. 2 falls this past week.     Cardiomyopathy diagnosed 02/2020 presenting for a stroke.   Because of dementia no invasive evaluation.  NST no ischemia 10/2019.  Most recent echo 09/2019 LVEF 35 to 40% started on low-dose.   Acute on chronic systolic and diastolic CHF-see above. Will also check labs and 2 gm sodium diet   Cryptogenic stroke   Frequent falls with to this week.  Discussed with patient and son need for possible placement which they are considering.   Patient usually falls when she is alone.  She has a sitter 5 days a week from 9 AM to 2 PM but otherwise is alone.   Shared Decision Making/Informed Consent   {Are you ordering a CV Procedure (e.g. stress test, cath, DCCV, TEE, etc)?   Press F2        :[id number]}    Medication Adjustments/Labs and Tests Ordered: Current medicines are reviewed at length with the patient today.  Concerns regarding medicines are outlined above.  Medication changes, Labs and Tests ordered today are listed in the Patient Instructions below. There are no Patient Instructions on file for this visit.   Sumner Boast, PA-C  11/06/2020 8:45 AM    [facility] [place] [address], [place], [place]  [address] Phone: [phone]; Fax: [phone]

## 2020-11-13 ENCOUNTER — Ambulatory Visit: Payer: Medicare HMO | Admitting: Physician Assistant

## 2020-11-16 ENCOUNTER — Ambulatory Visit (INDEPENDENT_AMBULATORY_CARE_PROVIDER_SITE_OTHER): Payer: Medicare HMO

## 2020-11-16 DIAGNOSIS — I639 Cerebral infarction, unspecified: Secondary | ICD-10-CM

## 2020-11-16 LAB — CUP PACEART REMOTE DEVICE CHECK
Date Time Interrogation Session: 20220420232055
Implantable Pulse Generator Implant Date: 20210325

## 2020-11-23 ENCOUNTER — Telehealth (HOSPITAL_COMMUNITY): Payer: Self-pay | Admitting: *Deleted

## 2020-11-23 NOTE — Telephone Encounter (Signed)
noted 

## 2020-11-23 NOTE — Telephone Encounter (Signed)
Patient son called and would like for provider to please call him back. Per pt son there was a discuss about patient getting a neurology test completed. Per pt son, patient already had this test done and it is within [facility] system on 11/22/2019. Patient would like to talk with provider and would like her to please call [phone]

## 2020-11-23 NOTE — Telephone Encounter (Signed)
Advised son to gain gaudianship asap so she can be safely placed

## 2020-11-26 ENCOUNTER — Inpatient Hospital Stay (HOSPITAL_COMMUNITY)
Admission: EM | Admit: 2020-11-26 | Discharge: 2020-12-01 | DRG: 689 | Disposition: A | Discharge status: Skilled Nursing Facility | Payer: Medicare HMO | Attending: Internal Medicine | Admitting: Internal Medicine

## 2020-11-26 ENCOUNTER — Emergency Department (HOSPITAL_COMMUNITY): Payer: Medicare HMO

## 2020-11-26 ENCOUNTER — Other Ambulatory Visit: Payer: Self-pay

## 2020-11-26 ENCOUNTER — Encounter (HOSPITAL_COMMUNITY): Payer: Self-pay

## 2020-11-26 ENCOUNTER — Observation Stay (HOSPITAL_COMMUNITY): Payer: Medicare HMO

## 2020-11-26 DIAGNOSIS — Z853 Personal history of malignant neoplasm of breast: Secondary | ICD-10-CM

## 2020-11-26 DIAGNOSIS — E538 Deficiency of other specified B group vitamins: Secondary | ICD-10-CM | POA: Diagnosis present

## 2020-11-26 DIAGNOSIS — Z515 Encounter for palliative care: Secondary | ICD-10-CM | POA: Diagnosis not present

## 2020-11-26 DIAGNOSIS — R0602 Shortness of breath: Secondary | ICD-10-CM

## 2020-11-26 DIAGNOSIS — Z8673 Personal history of transient ischemic attack (TIA), and cerebral infarction without residual deficits: Secondary | ICD-10-CM | POA: Diagnosis not present

## 2020-11-26 DIAGNOSIS — G4700 Insomnia, unspecified: Secondary | ICD-10-CM | POA: Diagnosis present

## 2020-11-26 DIAGNOSIS — N3000 Acute cystitis without hematuria: Secondary | ICD-10-CM | POA: Diagnosis not present

## 2020-11-26 DIAGNOSIS — G9341 Metabolic encephalopathy: Secondary | ICD-10-CM | POA: Diagnosis present

## 2020-11-26 DIAGNOSIS — Z20822 Contact with and (suspected) exposure to covid-19: Secondary | ICD-10-CM | POA: Diagnosis present

## 2020-11-26 DIAGNOSIS — Z96651 Presence of right artificial knee joint: Secondary | ICD-10-CM | POA: Diagnosis present

## 2020-11-26 DIAGNOSIS — M8580 Other specified disorders of bone density and structure, unspecified site: Secondary | ICD-10-CM | POA: Diagnosis present

## 2020-11-26 DIAGNOSIS — I480 Paroxysmal atrial fibrillation: Secondary | ICD-10-CM | POA: Diagnosis present

## 2020-11-26 DIAGNOSIS — R296 Repeated falls: Secondary | ICD-10-CM | POA: Diagnosis present

## 2020-11-26 DIAGNOSIS — E1149 Type 2 diabetes mellitus with other diabetic neurological complication: Secondary | ICD-10-CM | POA: Diagnosis present

## 2020-11-26 DIAGNOSIS — F0391 Unspecified dementia with behavioral disturbance: Secondary | ICD-10-CM | POA: Diagnosis present

## 2020-11-26 DIAGNOSIS — E876 Hypokalemia: Secondary | ICD-10-CM | POA: Diagnosis not present

## 2020-11-26 DIAGNOSIS — Z87891 Personal history of nicotine dependence: Secondary | ICD-10-CM | POA: Diagnosis not present

## 2020-11-26 DIAGNOSIS — Z66 Do not resuscitate: Secondary | ICD-10-CM | POA: Diagnosis present

## 2020-11-26 DIAGNOSIS — B9620 Unspecified Escherichia coli [E. coli] as the cause of diseases classified elsewhere: Secondary | ICD-10-CM | POA: Diagnosis present

## 2020-11-26 DIAGNOSIS — I639 Cerebral infarction, unspecified: Secondary | ICD-10-CM | POA: Diagnosis not present

## 2020-11-26 DIAGNOSIS — Z7984 Long term (current) use of oral hypoglycemic drugs: Secondary | ICD-10-CM

## 2020-11-26 DIAGNOSIS — E7800 Pure hypercholesterolemia, unspecified: Secondary | ICD-10-CM | POA: Diagnosis present

## 2020-11-26 DIAGNOSIS — F32A Depression, unspecified: Secondary | ICD-10-CM | POA: Diagnosis present

## 2020-11-26 DIAGNOSIS — I5022 Chronic systolic (congestive) heart failure: Secondary | ICD-10-CM | POA: Diagnosis present

## 2020-11-26 DIAGNOSIS — I110 Hypertensive heart disease with heart failure: Secondary | ICD-10-CM | POA: Diagnosis present

## 2020-11-26 DIAGNOSIS — Z7982 Long term (current) use of aspirin: Secondary | ICD-10-CM

## 2020-11-26 DIAGNOSIS — F424 Excoriation (skin-picking) disorder: Secondary | ICD-10-CM | POA: Diagnosis present

## 2020-11-26 DIAGNOSIS — Z888 Allergy status to other drugs, medicaments and biological substances status: Secondary | ICD-10-CM

## 2020-11-26 DIAGNOSIS — F419 Anxiety disorder, unspecified: Secondary | ICD-10-CM | POA: Diagnosis present

## 2020-11-26 DIAGNOSIS — N179 Acute kidney failure, unspecified: Secondary | ICD-10-CM | POA: Diagnosis not present

## 2020-11-26 DIAGNOSIS — F0390 Unspecified dementia without behavioral disturbance: Secondary | ICD-10-CM

## 2020-11-26 DIAGNOSIS — F330 Major depressive disorder, recurrent, mild: Secondary | ICD-10-CM

## 2020-11-26 DIAGNOSIS — N390 Urinary tract infection, site not specified: Principal | ICD-10-CM | POA: Diagnosis present

## 2020-11-26 DIAGNOSIS — Z79899 Other long term (current) drug therapy: Secondary | ICD-10-CM

## 2020-11-26 DIAGNOSIS — Z7189 Other specified counseling: Secondary | ICD-10-CM | POA: Diagnosis not present

## 2020-11-26 LAB — CBC WITH DIFFERENTIAL/PLATELET
Abs Immature Granulocytes: 0.02 10*3/uL (ref 0.00–0.07)
Basophils Absolute: 0 10*3/uL (ref 0.0–0.1)
Basophils Relative: 0 %
Eosinophils Absolute: 0.1 10*3/uL (ref 0.0–0.5)
Eosinophils Relative: 2 %
HCT: 41.7 % (ref 36.0–46.0)
Hemoglobin: 13.4 g/dL (ref 12.0–15.0)
Immature Granulocytes: 0 %
Lymphocytes Relative: 19 %
Lymphs Abs: 1.1 10*3/uL (ref 0.7–4.0)
MCH: 29.3 pg (ref 26.0–34.0)
MCHC: 32.1 g/dL (ref 30.0–36.0)
MCV: 91.2 fL (ref 80.0–100.0)
Monocytes Absolute: 0.4 10*3/uL (ref 0.1–1.0)
Monocytes Relative: 6 %
Neutro Abs: 4.2 10*3/uL (ref 1.7–7.7)
Neutrophils Relative %: 73 %
Platelets: 220 10*3/uL (ref 150–400)
RBC: 4.57 MIL/uL (ref 3.87–5.11)
RDW: 12.7 % (ref 11.5–15.5)
WBC: 5.8 10*3/uL (ref 4.0–10.5)
nRBC: 0 % (ref 0.0–0.2)

## 2020-11-26 LAB — RESP PANEL BY RT-PCR (FLU A&B, COVID) ARPGX2
Influenza A by PCR: NEGATIVE
Influenza B by PCR: NEGATIVE
SARS Coronavirus 2 by RT PCR: NEGATIVE

## 2020-11-26 LAB — COMPREHENSIVE METABOLIC PANEL
ALT: 13 U/L (ref 0–44)
AST: 20 U/L (ref 15–41)
Albumin: 3.7 g/dL (ref 3.5–5.0)
Alkaline Phosphatase: 43 U/L (ref 38–126)
Anion gap: 8 (ref 5–15)
BUN: 21 mg/dL (ref 8–23)
CO2: 29 mmol/L (ref 22–32)
Calcium: 9.8 mg/dL (ref 8.9–10.3)
Chloride: 102 mmol/L (ref 98–111)
Creatinine, Ser: 1.02 mg/dL — ABNORMAL HIGH (ref 0.44–1.00)
GFR, Estimated: 55 mL/min — ABNORMAL LOW (ref >60)
Glucose, Bld: 127 mg/dL — ABNORMAL HIGH (ref 70–99)
Potassium: 4.5 mmol/L (ref 3.5–5.1)
Sodium: 139 mmol/L (ref 135–145)
Total Bilirubin: 0.5 mg/dL (ref 0.3–1.2)
Total Protein: 6.5 g/dL (ref 6.5–8.1)

## 2020-11-26 LAB — URINALYSIS, ROUTINE W REFLEX MICROSCOPIC
Bilirubin Urine: NEGATIVE
Glucose, UA: NEGATIVE mg/dL
Hgb urine dipstick: NEGATIVE
Ketones, ur: NEGATIVE mg/dL
Leukocytes,Ua: NEGATIVE
Nitrite: POSITIVE — AB
Protein, ur: NEGATIVE mg/dL
Specific Gravity, Urine: 1.014 (ref 1.005–1.030)
pH: 6 (ref 5.0–8.0)

## 2020-11-26 LAB — GLUCOSE, CAPILLARY: Glucose-Capillary: 91 mg/dL (ref 70–99)

## 2020-11-26 MED ORDER — INSULIN ASPART 100 UNIT/ML IJ SOLN
0.0000 [IU] | Freq: Three times a day (TID) | INTRAMUSCULAR | Status: DC
  Administered 2020-11-27 (×2): 2 [IU] via SUBCUTANEOUS
  Administered 2020-11-28 (×3): 1 [IU] via SUBCUTANEOUS
  Administered 2020-11-29: 3 [IU] via SUBCUTANEOUS
  Administered 2020-11-29 (×2): 2 [IU] via SUBCUTANEOUS
  Administered 2020-11-30: 3 [IU] via SUBCUTANEOUS
  Administered 2020-11-30: 1 [IU] via SUBCUTANEOUS
  Administered 2020-11-30 – 2020-12-01 (×2): 2 [IU] via SUBCUTANEOUS
  Administered 2020-12-01: 1 [IU] via SUBCUTANEOUS

## 2020-11-26 MED ORDER — INSULIN ASPART 100 UNIT/ML IJ SOLN: 0.0000 [IU] | Freq: Every day | INTRAMUSCULAR | Status: DC

## 2020-11-26 MED ORDER — ASPIRIN EC 81 MG PO TBEC
81.0000 mg | DELAYED_RELEASE_TABLET | Freq: Every morning | ORAL | Status: DC
  Administered 2020-11-28 – 2020-12-01 (×4): 81 mg via ORAL
  Filled 2020-11-26 (×5): qty 1

## 2020-11-26 MED ORDER — ONDANSETRON HCL 4 MG/2ML IJ SOLN: 4.0000 mg | Freq: Four times a day (QID) | INTRAMUSCULAR | Status: DC | PRN

## 2020-11-26 MED ORDER — ENSURE ENLIVE PO LIQD
237.0000 mL | Freq: Two times a day (BID) | ORAL | Status: DC
  Administered 2020-11-28 – 2020-12-01 (×6): 237 mL via ORAL

## 2020-11-26 MED ORDER — SODIUM CHLORIDE 0.9 % IV SOLN
1.0000 g | Freq: Once | INTRAVENOUS | Status: AC
  Administered 2020-11-26: 1 g via INTRAVENOUS
  Filled 2020-11-26: qty 10

## 2020-11-26 MED ORDER — CEFTRIAXONE SODIUM 1 G IJ SOLR
1.0000 g | INTRAMUSCULAR | Status: DC
  Administered 2020-11-27 – 2020-11-28 (×2): 1 g via INTRAVENOUS
  Filled 2020-11-26 (×2): qty 10

## 2020-11-26 MED ORDER — ALPRAZOLAM 0.5 MG PO TABS
0.5000 mg | ORAL_TABLET | Freq: Every day | ORAL | Status: DC
  Administered 2020-11-27 – 2020-11-30 (×4): 0.5 mg via ORAL
  Filled 2020-11-26 (×5): qty 1

## 2020-11-26 MED ORDER — ACETAMINOPHEN 650 MG RE SUPP: 650.0000 mg | Freq: Four times a day (QID) | RECTAL | Status: DC | PRN

## 2020-11-26 MED ORDER — ONDANSETRON HCL 4 MG PO TABS: 4.0000 mg | ORAL_TABLET | Freq: Four times a day (QID) | ORAL | Status: DC | PRN

## 2020-11-26 MED ORDER — ACETAMINOPHEN 325 MG PO TABS
650.0000 mg | ORAL_TABLET | Freq: Four times a day (QID) | ORAL | Status: DC | PRN
  Administered 2020-11-27 – 2020-12-01 (×4): 650 mg via ORAL
  Filled 2020-11-26 (×4): qty 2

## 2020-11-26 MED ORDER — METOPROLOL SUCCINATE ER 25 MG PO TB24
25.0000 mg | ORAL_TABLET | Freq: Every day | ORAL | Status: DC
  Administered 2020-11-28 – 2020-12-01 (×4): 25 mg via ORAL
  Filled 2020-11-26 (×5): qty 1

## 2020-11-26 MED ORDER — ENOXAPARIN SODIUM 40 MG/0.4ML IJ SOSY
40.0000 mg | PREFILLED_SYRINGE | INTRAMUSCULAR | Status: DC
  Administered 2020-11-27 – 2020-11-29 (×3): 40 mg via SUBCUTANEOUS
  Filled 2020-11-26 (×4): qty 0.4

## 2020-11-26 MED ORDER — POLYETHYLENE GLYCOL 3350 17 G PO PACK: 17.0000 g | PACK | Freq: Every day | ORAL | Status: DC | PRN

## 2020-11-26 NOTE — ED Notes (Signed)
Pt will not sign MSE form, thinks that her mother is supposed to sign. Mother is dead.

## 2020-11-26 NOTE — ED Notes (Signed)
Report given to Bre, RN.

## 2020-11-26 NOTE — ED Triage Notes (Signed)
Per EMS, pt has dementia. Has home caregiver Mon-Fri and on weekends lives alone. Family found that pt had used her walker and wandered out of her house, across the street to another house looking for her mother who has passed away years ago. Pt confused to events, location. Per EMS, son has been trying to have pt see her PCP for possible UTI recently as well. Family talked pt into coming to ER today for a check-up. Pt is alert to self, city. [place] is [place] the police station. Denies pain or dyspnea. RR even and nonlabored. Strong smell of urine noted.

## 2020-11-26 NOTE — ED Provider Notes (Signed)
[facility] Provider Note   CSN: [id number] Arrival date & time: 11/26/20  1123     History Chief Complaint  Patient presents with  . Dementia    Amy [name] is a 85 y.o. female.  85 year old female brought in by EMS, per EMS, pt has dementia. Has home caregiver Mon-Fri and on weekends lives alone. Family found that pt had used her walker and wandered out of her house, across the street to another house looking for her mother who has passed away years ago. Pt confused to events, location. Per EMS, son has been trying to have pt see her PCP for possible UTI recently as well. Family talked pt into coming to ER today for a check-up. Pt is alert to self, city. Thinks she is at the police station. Denies pain or dyspnea. RR even and nonlabored. Strong smell of urine noted. Level 5 caveat applies.         Past Medical History:  Diagnosis Date  . Anxiety   . Arthritis   . Back pain   . Colon polyps   . Dementia ([place])    early signs per daughter in law  . Depression   . Diabetes mellitus, type II ([place])    diet controlled  . Hypercholesterolemia   . Invasive ductal carcinoma of breast, female, right ([place])   . Osteopenia 11/26/2016  . Skin-picking disorder    has 3 open wounds on right wrist that are being treated. About a quarter in size.  . Stroke [facility])     Patient Active Problem List   Diagnosis Date Noted  . Confusion 07/12/2020  . Postprocedural seroma of skin and subcutaneous tissue following other procedure   . Difficulty with speech 07/10/2020  . Lymphadenopathy, inguinal   . Lymphadenopathy, retroperitoneal 02/16/2020  . Cryptogenic stroke ([place]) 10/21/2019  . Palpitations 10/21/2019  . Cognitive impairment   . UTI (urinary tract infection) 10/05/2019  . Dehydration   . Major depressive disorder, recurrent episode, mild with anxious distress ([place])   . Acute lower UTI   . Deficiency of vitamin B12   . Physical deconditioning   . Acute  metabolic encephalopathy 29/92/4268  . Acute encephalopathy 08/16/2019  . Mild renal insufficiency 08/16/2019  . Chronic systolic CHF (congestive heart failure) ([place]) 08/16/2019  . Cardiac LV ejection fraction 30-35% 03/29/2019  . Dyslipidemia associated with type 2 diabetes mellitus ([place]) 03/29/2019  . Major depression with psychotic features ([place]) 03/29/2019  . History of CVA (cerebrovascular accident) 03/20/2019  . Edema of both lower legs due to peripheral venous insufficiency 10/12/2018  . Anemia 05/12/2018  . S/P total knee replacement, right 05/05/18 05/12/2018  . Type 2 diabetes mellitus with neurological complications ([place]) 34/19/6222  . Primary osteoarthritis of right knee   . Status post surgery 01/26/2018  . Surgery, elective 01/26/2018  . Degenerative spondylolisthesis 01/26/2018  . Osteoporosis 12/13/2016  . Osteopenia 11/26/2016  . Invasive ductal carcinoma of breast, female, right ([place])   . Lumbago 03/22/2014  . Stiffness of joint, not elsewhere classified, pelvic region and thigh 03/22/2014  . Muscle weakness (generalized) 03/22/2014  . Abnormality of gait 03/22/2014  . Depression with anxiety 09/03/2012  . Insomnia secondary to depression with anxiety 09/03/2012  . Pain 09/03/2012    Past Surgical History:  Procedure Laterality Date  . APPENDECTOMY    . BACK SURGERY    . BREAST BIOPSY Right 10/09/2016   Procedure: BREAST BIOPSY WITH NEEDLE LOCALIZATION;  Surgeon: [name],  MD;  Location: [facility] ORS;  Service: General;  Laterality: Right;  . BREAST SURGERY Right   . BUBBLE STUDY  03/23/2019   Procedure: BUBBLE STUDY;  Surgeon: [name], MD;  Location: [facility] ENDOSCOPY;  Service: Cardiovascular;;  . BUNIONECTOMY Bilateral   . FACIAL COSMETIC SURGERY     drooping eyelid and brow  . HERNIA REPAIR Right    Inguinal x2  . implantable loop recorder placement  10/21/2019    Medtronic Reveal [place] model LNQ 11 [id number]) implanted by Dr [name] for cryptogenic  stroke indication  . INGUINAL LYMPH NODE BIOPSY Left 03/10/2020   Procedure: LEFT INGUINAL LYMPH NODE BIOPSY;  Surgeon: [name], MD;  Location: [facility] ORS;  Service: General;  Laterality: Left;  . KNEE ARTHROSCOPY Right   . SHOULDER SURGERY Right    rotator cuff repair and removal of bone spur  . TEE WITHOUT CARDIOVERSION N/A 03/23/2019   Procedure: TRANSESOPHAGEAL ECHOCARDIOGRAM (TEE);  Surgeon: [name], MD;  Location[facility] ENDOSCOPY;  Service: Cardiovascular;  Laterality: N/A;  . TEMPOROMANDIBULAR JOINT SURGERY Right   . TOTAL KNEE ARTHROPLASTY Right 05/05/2018   Procedure: TOTAL KNEE ARTHROPLASTY;  Surgeon: [name], MD;  Location: [facility] ORS;  Service: Orthopedics;  Laterality: Right;     OB History   No obstetric history on file.     Family History  Problem Relation Age of Onset  . Anxiety disorder Mother   . Depression Mother   . Depression Maternal Grandmother   . Alcohol abuse Brother   . Cancer - Other Sister   . ADD / ADHD Neg Hx   . Bipolar disorder Neg Hx   . Dementia Neg Hx   . Drug abuse Neg Hx   . OCD Neg Hx   . Paranoid behavior Neg Hx   . Schizophrenia Neg Hx   . Seizures Neg Hx   . Sexual abuse Neg Hx   . Physical abuse Neg Hx     Social History   Tobacco Use  . Smoking status: Former Smoker    Packs/day: 0.50    Years: 40.00    Pack years: 20.00    Types: Cigarettes    Quit date: 09/04/1995    Years since quitting: 25.2  . Smokeless tobacco: Never Used  Vaping Use  . Vaping Use: Never used  Substance Use Topics  . Alcohol use: No  . Drug use: No    Types: Benzodiazepines, Hydrocodone    Home Medications Prior to Admission medications   Medication Sig Start Date End Date Taking? Authorizing Provider  ALPRAZolam Duanne Moron) 0.5 MG tablet Take 1 tablet (0.5 mg total) by mouth at bedtime. 09/21/20   Cloria Spring, MD  aspirin EC 81 MG tablet Take 81 mg by mouth every morning.    [provider]  calcium carbonate (OSCAL)  1500 (600 Ca) MG TABS tablet Take 600 mg of elemental calcium by mouth daily with supper.     [provider]  cholecalciferol (VITAMIN D) 1000 units tablet Take 1 tablet (1,000 Units total) by mouth daily. 10/23/16   Baird Cancer, PA-C  donepezil (ARICEPT) 5 MG tablet Take 1 tablet (5 mg total) by mouth daily. 09/21/20   Cloria Spring, MD  escitalopram (LEXAPRO) 20 MG tablet TAKE 1 TABLET(20 MG) BY MOUTH DAILY Patient taking differently: Take 20 mg by mouth daily. 10/09/20   Cloria Spring, MD  furosemide (LASIX) 20 MG tablet Take 2 tablets twice a day for three (3)  days, then take 1 tablet once daily 5 days a week. 10/09/20   [name], PA-C  gabapentin (NEURONTIN) 300 MG capsule Take 300 mg by mouth 2 (two) times daily. 08/15/20   [provider]  letrozole ([place]) 2.5 MG tablet TAKE 1 TABLET(2.5 MG) BY MOUTH DAILY 07/04/20   Nicholas Lose, MD  metFORMIN (GLUCOPHAGE) 500 MG tablet Take 1 tablet (500 mg total) by mouth 2 (two) times daily with a meal. 04/09/19   Nyoka Cowden, Phylis Bougie, NP  metoprolol succinate (TOPROL-XL) 25 MG 24 hr tablet Take 1 tablet (25 mg total) by mouth daily. Please keep upcoming appt in January 2022 before anymore refills. Thank you 06/29/20   Fay Records, MD  oxybutynin (DITROPAN) 5 MG tablet Take 5 mg by mouth at bedtime. 11/30/19   [provider]  risperiDONE (RISPERDAL) 0.5 MG tablet Take 1 tablet (0.5 mg total) by mouth at bedtime. 09/21/20   Cloria Spring, MD  traMADol (ULTRAM) 50 MG tablet Take 50 mg by mouth every 6 (six) hours as needed. 09/25/20   [provider]  venlafaxine XR (EFFEXOR-XR) 150 MG 24 hr capsule TAKE 1 CAPSULE(150 MG) BY MOUTH TWICE DAILY 09/21/20   Cloria Spring, MD    Allergies    Cymbalta [duloxetine hcl] and Duloxetine  Review of Systems   Review of Systems  Unable to perform ROS: Dementia    Physical Exam Updated Vital Signs BP (!) 166/74   Pulse 70   Temp 97.7 F (36.5 C) (Oral)   Resp  20   Ht 5\' 4"  (1.626 m)   Wt 73 kg   SpO2 97%   BMI 27.62 kg/m   Physical Exam Vitals and nursing note reviewed.  Constitutional:      Appearance: Normal appearance.  HENT:     Head: Normocephalic and atraumatic.     Mouth/Throat:     Mouth: Mucous membranes are moist.  Eyes:     Conjunctiva/sclera: Conjunctivae normal.  Cardiovascular:     Rate and Rhythm: Normal rate and regular rhythm.  Pulmonary:     Effort: Pulmonary effort is normal.     Breath sounds: Normal breath sounds.  Abdominal:     Palpations: Abdomen is soft.     Tenderness: There is abdominal tenderness in the right lower quadrant, suprapubic area and left lower quadrant.  Musculoskeletal:     Cervical back: Neck supple.  Skin:    General: Skin is warm and dry.  Neurological:     General: No focal deficit present.     Mental Status: She is alert.     GCS: GCS eye subscore is 4. GCS verbal subscore is 4. GCS motor subscore is 6.     ED Results / Procedures / Treatments   Labs (all labs ordered are listed, but only abnormal results are displayed) Labs Reviewed  COMPREHENSIVE METABOLIC PANEL - Abnormal; Notable for the following components:      Result Value   Glucose, Bld 127 (*)    Creatinine, Ser 1.02 (*)    GFR, Estimated 55 (*)    All other components within normal limits  URINALYSIS, ROUTINE W REFLEX MICROSCOPIC - Abnormal; Notable for the following components:   APPearance HAZY (*)    Nitrite POSITIVE (*)    Bacteria, UA RARE (*)    All other components within normal limits  RESP PANEL BY RT-PCR (FLU A&B, COVID) ARPGX2  URINE CULTURE  CULTURE, BLOOD (ROUTINE X 2)  CULTURE, BLOOD (ROUTINE  X 2)  CBC WITH DIFFERENTIAL/PLATELET    EKG None  Radiology No results found.  Procedures Procedures   Medications Ordered in ED Medications  cefTRIAXone (ROCEPHIN) 1 g in sodium chloride 0.9 % 100 mL IVPB (has no administration in time range)    ED Course  I have reviewed the triage vital  signs and the nursing notes.  Pertinent labs & imaging results that were available during my care of the patient were reviewed by me and considered in my medical decision making (see chart for details).  Clinical Course as of 11/26/20 Mikki Santee [birth year]  [id number] 85 year old female with history of dementia here after having wandered from her home.  Reportedly family are concerned that she might have a UTI.  Patient herself denies any complaints.  Oriented to self.  Getting some screening labs urinalysis. [MB]  1813 Urinalysis concerning for UTI with nitrite positive urine with rare bacteria will be sent for culture.  Patient will be started on Rocephin, hospitalist paged for consult for admission for UTI with decline in mental status. Meaning labs are reassuring including CBC which is unremarkable, CMP without significant changes and respiratory panel which is negative for COVID and flu. Informed patient she will need an IV for her IV antibiotics and admission to the hospital.  Patient requests that I call her mother Yvonnia and inform her that she is not worried. [LM]  1829 Discussed with Dr. Denton Brick with [facility] who will consult for admission. [LM]    Clinical Course User Index [LM] Tacy Learn, PA-C [MB] Hayden Rasmussen, MD   MDM Rules/Calculators/A&P                          Final Clinical Impression(s) / ED Diagnoses Final diagnoses:  Urinary tract infection in female  Dementia without behavioral disturbance, unspecified dementia type [facility])    Rx / DC Orders ED Discharge Orders    None       Tacy Learn, PA-C 11/26/20 1830    Hayden Rasmussen, MD 11/27/20 [phone]

## 2020-11-26 NOTE — ED Notes (Signed)
Patient had sizeable amount of incontinence at this time. Patient's linen and sheets changed at this time and provided with chuck pad and new brief. Purewick hooked to suction and is intact. Patient voices no complaints at this time. Son (POA) at bedside and states he wants social work to follow up with patient for SNF placement.

## 2020-11-26 NOTE — ED Notes (Signed)
Called Med Surg to give report on pt, this nurse notified that Amy [name] will be taking patient and will have to call this nurse back.

## 2020-11-26 NOTE — ED Notes (Signed)
Pt remains continent at this time, no urine production via Purewick. Pt denies urge to urinate.

## 2020-11-26 NOTE — H&P (Signed)
History and Physical    Amy [name] QAS:[id number] DOB: 11/09/1935 DOA: 11/26/2020  PCP: Amy Gravel, MD   Patient coming from: Home  I have personally briefly reviewed patient's old medical records in [place]  Chief Complaint: Wandering  HPI: Amy [name] is a 85 y.o. female with medical history significant for dementia, stroke, systolic CHF, diabetes mellitus. Patient was brought to the ED via EMS.  History is obtained from chart review and from talking to patient's son Amy [name] on the phone, who is patient's healthcare power of attorney. As at the time of my evaluation, patient is confused.,  Able to tell me her name but not where she is, telling me she came to the hospital because of her mother,   Patient's son reported that patient usually has a caregiver to sits with her during the day, the caregiver is off on Sundays.  Patient's son, saw patient walk out of the house today [place], saying she was going to look for her mom who died 15 years ago.  He talked to her through the camera, trying to get her to get back home, but she did not.  She walked across the street and was banging on the door of the house opposite ( a cousin's house).  Patient was diagnosed with dementia April 2021, and since then patient's dementia has progressed.  Patient is unable to care for self, and needs assistance with most activities of daily living.  Patient is unable to cook, when the caregiver leaves, patient will not eat, is unable to warm up her food with the microwave, will not take her medications which are in her pillbox and despite alarms to help, she would sit in a her diaper all night.  At baseline, patient sometimes recognizes family.  Patient's son reports that patient's current living situation is not safe for patient anymore.  ED Course: Blood pressure systolic 798X to 211H, otherwise stable vitals.  WBC 5.8.  UA positive nitrite rare bacteria.  Stable creatinine 1.02.   IV ceftriaxone started.  Hospitalist to admit.  Review of Systems: Unable to ascertain due to altered mental status  Past Medical History:  Diagnosis Date  . Anxiety   . Arthritis   . Back pain   . Colon polyps   . Dementia ([place])    early signs per daughter in law  . Depression   . Diabetes mellitus, type II ([place])    diet controlled  . Hypercholesterolemia   . Invasive ductal carcinoma of breast, female, right ([place])   . Osteopenia 11/26/2016  . Skin-picking disorder    has 3 open wounds on right wrist that are being treated. About a quarter in size.  . Stroke [facility])     Past Surgical History:  Procedure Laterality Date  . APPENDECTOMY    . BACK SURGERY    . BREAST BIOPSY Right 10/09/2016   Procedure: BREAST BIOPSY WITH NEEDLE LOCALIZATION;  Surgeon: [name], MD;  Location: [facility] ORS;  Service: General;  Laterality: Right;  . BREAST SURGERY Right   . BUBBLE STUDY  03/23/2019   Procedure: BUBBLE STUDY;  Surgeon: [name], MD;  Location: [facility] ENDOSCOPY;  Service: Cardiovascular;;  . BUNIONECTOMY Bilateral   . FACIAL COSMETIC SURGERY     drooping eyelid and brow  . HERNIA REPAIR Right    Inguinal x2  . implantable loop recorder placement  10/21/2019    Medtronic Reveal [place] model LNQ 11 [id number]) implanted by Dr  Allred for cryptogenic stroke indication  . INGUINAL LYMPH NODE BIOPSY Left 03/10/2020   Procedure: LEFT INGUINAL LYMPH NODE BIOPSY;  Surgeon: [name], MD;  Location: [facility] ORS;  Service: General;  Laterality: Left;  . KNEE ARTHROSCOPY Right   . SHOULDER SURGERY Right    rotator cuff repair and removal of bone spur  . TEE WITHOUT CARDIOVERSION N/A 03/23/2019   Procedure: TRANSESOPHAGEAL ECHOCARDIOGRAM (TEE);  Surgeon: [name], MD;  Location: [facility] ENDOSCOPY;  Service: Cardiovascular;  Laterality: N/A;  . TEMPOROMANDIBULAR JOINT SURGERY Right   . TOTAL KNEE ARTHROPLASTY Right 05/05/2018   Procedure: TOTAL KNEE ARTHROPLASTY;  Surgeon: [name], MD;  Location: [facility] ORS;  Service: Orthopedics;  Laterality: Right;     reports that she quit smoking about 25 years ago. Her smoking use included cigarettes. She has a 20.00 pack-year smoking history. She has never used smokeless tobacco. She reports that she does not drink alcohol and does not use drugs.  Allergies  Allergen Reactions  . Cymbalta [Duloxetine Hcl] Other (See Comments)    Hallucinated and couldn't think; got worse and worse the longer she took this.  . Duloxetine     Family History  Problem Relation Age of Onset  . Anxiety disorder Mother   . Depression Mother   . Depression Maternal Grandmother   . Alcohol abuse Brother   . Cancer - Other Sister   . ADD / ADHD Neg Hx   . Bipolar disorder Neg Hx   . Dementia Neg Hx   . Drug abuse Neg Hx   . OCD Neg Hx   . Paranoid behavior Neg Hx   . Schizophrenia Neg Hx   . Seizures Neg Hx   . Sexual abuse Neg Hx   . Physical abuse Neg Hx     Prior to Admission medications   Medication Sig Start Date End Date Taking? Authorizing Provider  ALPRAZolam Duanne Moron) 0.5 MG tablet Take 1 tablet (0.5 mg total) by mouth at bedtime. 09/21/20   Cloria Spring, MD  aspirin EC 81 MG tablet Take 81 mg by mouth every morning.    [provider]  calcium carbonate (OSCAL) 1500 (600 Ca) MG TABS tablet Take 600 mg of elemental calcium by mouth daily with supper.     [provider]  cholecalciferol (VITAMIN D) 1000 units tablet Take 1 tablet (1,000 Units total) by mouth daily. 10/23/16   Baird Cancer, PA-C  donepezil (ARICEPT) 5 MG tablet Take 1 tablet (5 mg total) by mouth daily. 09/21/20   Cloria Spring, MD  escitalopram (LEXAPRO) 20 MG tablet TAKE 1 TABLET(20 MG) BY MOUTH DAILY Patient taking differently: Take 20 mg by mouth daily. 10/09/20   Cloria Spring, MD  furosemide (LASIX) 20 MG tablet Take 2 tablets twice a day for three (3) days, then take 1 tablet once daily 5 days a week. 10/09/20   [name],  PA-C  gabapentin (NEURONTIN) 300 MG capsule Take 300 mg by mouth 2 (two) times daily. 08/15/20   [provider]  letrozole ([place]) 2.5 MG tablet TAKE 1 TABLET(2.5 MG) BY MOUTH DAILY 07/04/20   Nicholas Lose, MD  metFORMIN (GLUCOPHAGE) 500 MG tablet Take 1 tablet (500 mg total) by mouth 2 (two) times daily with a meal. 04/09/19   Nyoka Cowden, Phylis Bougie, NP  metoprolol succinate (TOPROL-XL) 25 MG 24 hr tablet Take 1 tablet (25 mg total) by mouth daily. Please keep upcoming appt in January 2022 before anymore  refills. Thank you 06/29/20   Fay Records, MD  oxybutynin (DITROPAN) 5 MG tablet Take 5 mg by mouth at bedtime. 11/30/19   [provider]  risperiDONE (RISPERDAL) 0.5 MG tablet Take 1 tablet (0.5 mg total) by mouth at bedtime. 09/21/20   Cloria Spring, MD  traMADol (ULTRAM) 50 MG tablet Take 50 mg by mouth every 6 (six) hours as needed. 09/25/20   [provider]  venlafaxine XR (EFFEXOR-XR) 150 MG 24 hr capsule TAKE 1 CAPSULE(150 MG) BY MOUTH TWICE DAILY 09/21/20   Cloria Spring, MD    Physical Exam: Vitals:   11/26/20 1400 11/26/20 1430 11/26/20 1639 11/26/20 1730  BP: (!) 146/76 (!) 148/66 (!) 172/69 (!) 166/74  Pulse: 72 77 68 70  Resp: 20 20  20   Temp:      TempSrc:      SpO2: 96% 97% 96% 97%  Weight:      Height:        Constitutional: NAD, calm, comfortable Vitals:   11/26/20 1400 11/26/20 1430 11/26/20 1639 11/26/20 1730  BP: (!) 146/76 (!) 148/66 (!) 172/69 (!) 166/74  Pulse: 72 77 68 70  Resp: 20 20  20   Temp:      TempSrc:      SpO2: 96% 97% 96% 97%  Weight:      Height:       Eyes: PERRL, lids and conjunctivae normal ENMT: Mucous membranes are moist. Neck: normal, supple, no masses, no thyromegaly Respiratory: clear to auscultation bilaterally, no wheezing, no crackles. Normal respiratory effort. No accessory muscle use.  Cardiovascular: Regular rate and rhythm, no murmurs / rubs / gallops. No extremity edema. 2+ pedal pulses.   Abdomen: no  tenderness, no masses palpated. No hepatosplenomegaly. Bowel sounds positive.  Musculoskeletal: no clubbing / cyanosis. No joint deformity upper and lower extremities. Good ROM, no contractures. Normal muscle tone.  Skin: no rashes, lesions, ulcers. No induration Neurologic: No apparent cranial nerve abnormality, 4+5 strength in all extremities. Psychiatric: Awake and alert oriented to person only.  Not oriented to place time or situation..   Labs on Admission: I have personally reviewed following labs and imaging studies  CBC: Recent Labs  Lab 11/26/20 1214  WBC 5.8  NEUTROABS 4.2  HGB 13.4  HCT 41.7  MCV 91.2  PLT [id number]   Basic Metabolic Panel: Recent Labs  Lab 11/26/20 1214  NA 139  K 4.5  CL 102  CO2 29  GLUCOSE 127*  BUN 21  CREATININE 1.02*  CALCIUM 9.8   Liver Function Tests: Recent Labs  Lab 11/26/20 1214  AST 20  ALT 13  ALKPHOS 43  BILITOT 0.5  PROT 6.5  ALBUMIN 3.7   BNP (last 3 results) Recent Labs    08/21/20 0958  PROBNP 120   Urine analysis:    Component Value Date/Time   COLORURINE YELLOW 11/26/2020 1641   APPEARANCEUR HAZY (A) 11/26/2020 1641   LABSPEC 1.014 11/26/2020 1641   PHURINE 6.0 11/26/2020 [place] 11/26/2020 [place] 11/26/2020 [place] 11/26/2020 1641   BILIRUBINUR negative 06/28/2020 1108   [place] 11/26/2020 1641   PROTEINUR NEGATIVE 11/26/2020 1641   UROBILINOGEN 1.0 06/28/2020 1108   NITRITE POSITIVE (A) 11/26/2020 1641   LEUKOCYTESUR NEGATIVE 11/26/2020 1641    Radiological Exams on Admission: No results found.  EKG: None  Assessment/Plan Principal Problem:   Acute metabolic encephalopathy Active Problems:   Type 2 diabetes mellitus  with neurological complications ([place])   History of CVA (cerebrovascular accident)   Chronic systolic CHF (congestive heart failure) (HCC)   UTI (urinary tract infection)   Cryptogenic stroke (H[facility])   Acute metabolic  encephalopathy with baseline dementia, urinary tract infection-wandering, confused.  UA suggestive of UTI with positive nitrites.  Rules out for sepsis.  LAst urine cultures 06/2020 E. coli sensitive to cephalosporins.  Advanced dementia, needs assistance with most activities of daily living, can no longer care for self. -Obtain head CT -IV ceftriaxone 1 g daily -Follow-up urine cultures -Patient is on quite a few psychoactive agents, donepezil, Lexapro, gabapentin, tramadol, risperidone, venlafaxine, and oxybutynin, held for now with altered mental status - PT and TOC evaluation -Patient will need SNF placement  Chronic systolic heart failure-stable and compensated.  Last echo 09/2019 EF 35 to 40% with grade 1 diastolic dysfunction.  Diabetes mellitus-random glucose 127 - HgbA1C - SSI- S -Hold metformin for now  Paroxysmal atrial fibrillation- caught on loop recorder.  CHA2DS2-VASc equals 8 but not on anticoagulation due to frequent falls and dementia  Hypertension-elevated. -Resume metoprolol  Hx of stage 1A right breast cancer- s/p lumpectomy 2018 currently on Letrozole.  Follows with Dr. Delton Coombes  Cryptogenic stroke-  Stable. -Resume aspirin.  Not on statin.  DVT prophylaxis: Lovenox Code Status: DNR-confirmed with sonRoderic Palau on the phone.  Patient has a living will. Family Communication: Patient son - Amy [name] on the phone, who is HCPOA.  All questions answered, plan of care explained. He explained and emphasized that patient cannot go back to previous living situation.  Wants to be involved in discharge care plan. Disposition Plan:  ~ 2 days Consults called: none Admission status: Inpatient, MedSurg. I certify that at the point of admission it is my clinical judgment that the patient will require inpatient hospital care spanning beyond 2 midnights from the point of admission due to high intensity of service, high risk for further deterioration and high frequency of  surveillance required.    Bethena Roys MD [facility]  11/26/2020, 8:56 PM

## 2020-11-26 NOTE — ED Notes (Signed)
Information passed on to PA.

## 2020-11-26 NOTE — ED Notes (Addendum)
Son has POA and healthcare POA. States he is concerned she is a danger to herself as she has seemed increasingly confused over the past week, wandering more into the road. Son is concerned she may be hit by a car as she is not answering him when he asks her to return to the house. Pt has been refusing to see a doctor about possible UTI.  Son is Laurence Aly, [phone]. Son asks that no one else in the family is given information other than himself.

## 2020-11-26 NOTE — ED Notes (Signed)
Pt had soiled brief with a lot of urine. Brief removed, hygiene care performed. Purewick placed, explained the process to pt. Pt remains confused of why she is here, thinks her mother is waiting for her at home. Asking to go home. POC has been explained.

## 2020-11-27 ENCOUNTER — Encounter (HOSPITAL_COMMUNITY): Payer: Self-pay | Admitting: Internal Medicine

## 2020-11-27 ENCOUNTER — Inpatient Hospital Stay (HOSPITAL_COMMUNITY)
Admit: 2020-11-27 | Discharge: 2020-11-27 | Disposition: A | Discharge status: Home / Self Care | Payer: Medicare HMO | Attending: Internal Medicine | Admitting: Internal Medicine

## 2020-11-27 DIAGNOSIS — I5022 Chronic systolic (congestive) heart failure: Secondary | ICD-10-CM

## 2020-11-27 DIAGNOSIS — G9341 Metabolic encephalopathy: Secondary | ICD-10-CM

## 2020-11-27 DIAGNOSIS — Z515 Encounter for palliative care: Secondary | ICD-10-CM

## 2020-11-27 DIAGNOSIS — N390 Urinary tract infection, site not specified: Principal | ICD-10-CM

## 2020-11-27 DIAGNOSIS — E1149 Type 2 diabetes mellitus with other diabetic neurological complication: Secondary | ICD-10-CM

## 2020-11-27 DIAGNOSIS — Z7189 Other specified counseling: Secondary | ICD-10-CM

## 2020-11-27 LAB — GLUCOSE, CAPILLARY
Glucose-Capillary: 101 mg/dL — ABNORMAL HIGH (ref 70–99)
Glucose-Capillary: 165 mg/dL — ABNORMAL HIGH (ref 70–99)
Glucose-Capillary: 185 mg/dL — ABNORMAL HIGH (ref 70–99)
Glucose-Capillary: 118 mg/dL — ABNORMAL HIGH (ref 70–99)

## 2020-11-27 MED ORDER — QUETIAPINE FUMARATE 25 MG PO TABS
25.0000 mg | ORAL_TABLET | Freq: Two times a day (BID) | ORAL | Status: DC
  Administered 2020-11-27 – 2020-12-01 (×8): 25 mg via ORAL
  Filled 2020-11-27 (×9): qty 1

## 2020-11-27 MED ORDER — HALOPERIDOL LACTATE 5 MG/ML IJ SOLN
1.0000 mg | Freq: Four times a day (QID) | INTRAMUSCULAR | Status: DC | PRN
  Administered 2020-11-27 (×2): 1 mg via INTRAVENOUS
  Filled 2020-11-27 (×2): qty 1

## 2020-11-27 NOTE — TOC Progression Note (Addendum)
CSW spoke with Amy [name] at the [facility], she states pt has been accepted as long as she has her COVID vaccinations. CSW spoke with pts son who states pt has been vaccinated and he is almost certain she is boosted also. Amy [name] will start auth and update CSW when she hears back.   CSW updated that pt is in need of referral to outpatient palliative with [facility]. CSW made referral. TOC to follow.

## 2020-11-27 NOTE — Plan of Care (Signed)
  Problem: Acute Rehab PT Goals(only PT should resolve) Goal: Pt Will Go Supine/Side To Sit Outcome: Progressing Flowsheets (Taken 11/27/2020 1449) Pt will go Supine/Side to Sit:  with min guard assist  with minimal assist Goal: Patient Will Transfer Sit To/From Stand Outcome: Progressing Flowsheets (Taken 11/27/2020 1449) Patient will transfer sit to/from stand:  with min guard assist  with minimal assist Goal: Pt Will Transfer Bed To Chair/Chair To Bed Outcome: Progressing Flowsheets (Taken 11/27/2020 1449) Pt will Transfer Bed to Chair/Chair to Bed:  with min assist  min guard assist Goal: Pt Will Ambulate Outcome: Progressing Flowsheets (Taken 11/27/2020 1449) Pt will Ambulate:  50 feet  with min guard assist  with rolling walker  with minimal assist   2:50 PM, 11/27/20 Lonell Grandchild, MPT Physical Therapist with [facility] 336 [phone] office [phone] mobile phone

## 2020-11-27 NOTE — Progress Notes (Addendum)
[facility] PROGRESS NOTE   ELEASE SWARM JIR:[id number] DOB: [birth year] DOA: 11/26/2020  PCP: [name], MD  Brief History/Interval Summary: 85 y.o. female with medical history significant for dementia, stroke, systolic CHF, diabetes mellitus. Patient was brought to the ED via EMS.    History was primarily obtained from patient's son who is the patient's healthcare power of attorney.  Apparently patient has been wandering out of her house.  She has been progressively getting worse with her dementia.  Has not been able to take care of herself at home.  She lives by herself.  There was concern for medical reasons for acute worsening is her mentation.  She was brought into the ED.  She was hospitalized for further management due to concern for UTI.  Consultants: Palliative care  Procedures: None  Antibiotics: Anti-infectives (From admission, onward)   Start     Dose/Rate Route Frequency Ordered Stop   11/27/20 1800  cefTRIAXone (ROCEPHIN) 1 g in sodium chloride 0.9 % 100 mL IVPB        1 g 200 mL/hr over 30 Minutes Intravenous Every 24 hours 11/26/20 2138     11/26/20 1800  cefTRIAXone (ROCEPHIN) 1 g in sodium chloride 0.9 % 100 mL IVPB        1 g 200 mL/hr over 30 Minutes Intravenous  Once 11/26/20 1755 11/26/20 1946      Subjective/Interval History: Patient noted to be agitated this morning.  She is trying to get out of her bed.  She had to be redirected.  She was finally taken to a chair.  Does not appear to be in any discomfort.  Was able to walk to the chair with the use of her walker.     Assessment/Plan:  Progressive dementia versus acute metabolic encephalopathy CT head did not show any acute findings.  Age-related atrophy was noted.  UA was noted to be abnormal and the patient was started on antibiotics.   She is noted to be quite agitated.  Will use Seroquel and Haldol.  She was taking Seroquel prior to admission.  If her dementia is getting worse she may  benefit from palliative care consultation to address goals of care. Patient's vitamin B12 level was 1435 in December 2021.  RPR was negative last year.  Check her folic acid, TSH.   It is unlikely that the patient is experiencing any seizure/epilepsy.  EEG could be considered to complete the work-up but the patient may not be able to cooperate in her current agitated state.  Urinary tract infection UA was noted to be abnormal.  She was started on ceftriaxone.  Follow-up on urine cultures.  Chronic systolic CHF Echocardiogram from March 2021 showed EF to be 35 to 40% with grade 1 diastolic dysfunction.  No clear evidence for volume overload currently.  She is noted to be on aspirin.  Also on metoprolol which is being continued.  Diabetes mellitus type 2 Monitor CBGs.  Check HbA1c.  SSI.  History of paroxysmal atrial fibrillation Apparently caught on loop recorder.  On beta-blocker.  Not on anticoagulation due to frequent falls and dementia.  Essential hypertension Continue metoprolol.  History of stage I A breast cancer involving right breast She is status postlumpectomy in 2018.  Currently on letrozole.  Follows with oncology at the [facility].  History of cryptogenic stroke On aspirin.  Not noted to be on statin.    DVT Prophylaxis: Lovenox Code Status: DNR Family Communication: No family at bedside.  Will update son later today. Disposition Plan: Will likely need to go to skilled nursing facility as she is an unsafe discharge  Status is: Inpatient  Remains inpatient appropriate because:Altered mental status, Unsafe d/c plan, IV treatments appropriate due to intensity of illness or inability to take PO and Inpatient level of care appropriate due to severity of illness   Dispo: The patient is from: Home              Anticipated d/c is to: To be determined              Patient currently is not medically stable to d/c.   Difficult to place patient  No      Medications:  Scheduled: . ALPRAZolam  0.5 mg Oral QHS  . aspirin EC  81 mg Oral q morning  . enoxaparin (LOVENOX) injection  40 mg Subcutaneous Q24H  . feeding supplement  237 mL Oral BID BM  . insulin aspart  0-5 Units Subcutaneous QHS  . insulin aspart  0-9 Units Subcutaneous TID WC  . metoprolol succinate  25 mg Oral Daily  . QUEtiapine  25 mg Oral BID   Continuous: . cefTRIAXone (ROCEPHIN)  IV     WGN:FAOZHYQMVHQIO **OR** acetaminophen, haloperidol lactate, ondansetron **OR** ondansetron (ZOFRAN) IV, polyethylene glycol   Objective:  Vital Signs  Vitals:   11/26/20 2131 11/27/20 0200 11/27/20 0558 11/27/20 1030  BP: (!) 149/68 (!) 141/75 (!) 158/72 (!) 154/77  Pulse: 83 60 62 79  Resp: 18 17 18    Temp: 98.1 F (36.7 C) 98 F (36.7 C) 98.1 F (36.7 C) (!) 97.5 F (36.4 C)  TempSrc:   Oral Oral  SpO2: 98% 97% 97% 100%  Weight:      Height:        Intake/Output Summary (Last 24 hours) at 11/27/2020 1050 Last data filed at 11/27/2020 0700 Gross per 24 hour  Intake 600 ml  Output 330 ml  Net 270 ml   Filed Weights   11/26/20 1138  Weight: 73 kg    General appearance: Awake alert agitated. Resp: Clear to auscultation bilaterally.  Normal effort Cardio: S1-S2 is normal regular.  No S3-S4.  No rubs murmurs or bruit GI: Abdomen is soft.  Nontender nondistended.  Bowel sounds are present normal.  No masses organomegaly Extremities: No edema.  Noted to be moving all of her extremities Neurologic: Disoriented.  No focal neurological deficits.    Lab Results:  Data Reviewed: I have personally reviewed following labs and imaging studies  CBC: Recent Labs  Lab 11/26/20 1214  WBC 5.8  NEUTROABS 4.2  HGB 13.4  HCT 41.7  MCV 91.2  PLT 962    Basic Metabolic Panel: Recent Labs  Lab 11/26/20 1214  NA 139  K 4.5  CL 102  CO2 29  GLUCOSE 127*  BUN 21  CREATININE 1.02*  CALCIUM 9.8    GFR: Estimated Creatinine Clearance: 41.6 mL/min (A)  (by C-G formula based on SCr of 1.02 mg/dL (H)).  Liver Function Tests: Recent Labs  Lab 11/26/20 1214  AST 20  ALT 13  ALKPHOS 43  BILITOT 0.5  PROT 6.5  ALBUMIN 3.7     BNP (last 3 results) Recent Labs    08/21/20 0958  PROBNP 120    HbA1C: No results for input(s): HGBA1C in the last 72 hours.  CBG: Recent Labs  Lab 11/26/20 2158 11/27/20 0716  GLUCAP 91 101*     Recent Results (from the past 240  hour(s))  Resp Panel by RT-PCR (Flu A&B, Covid) Nasopharyngeal Swab     Status: None   Collection Time: 11/26/20 12:49 PM   Specimen: Nasopharyngeal Swab; Nasopharyngeal(NP) swabs in vial transport medium  Result Value Ref Range Status   SARS Coronavirus 2 by RT PCR NEGATIVE NEGATIVE Final    Comment: (NOTE) SARS-CoV-2 target nucleic acids are NOT DETECTED.  The SARS-CoV-2 RNA is generally detectable in upper respiratory specimens during the acute phase of infection. The lowest concentration of SARS-CoV-2 viral copies this assay can detect is 138 copies/mL. A negative result does not preclude SARS-Cov-2 infection and should not be used as the sole basis for treatment or other patient management decisions. A negative result may occur with  improper specimen collection/handling, submission of specimen other than nasopharyngeal swab, presence of viral mutation(s) within the areas targeted by this assay, and inadequate number of viral copies(<138 copies/mL). A negative result must be combined with clinical observations, patient history, and epidemiological information. The expected result is Negative.  Fact Sheet for Patients:  EntrepreneurPulse.com.au  Fact Sheet for Healthcare Providers:  IncredibleEmployment.be  This test is no t yet approved or cleared by the [place] FDA and  has been authorized for detection and/or diagnosis of SARS-CoV-2 by FDA under an Emergency Use Authorization (EUA). This EUA will remain  in  effect (meaning this test can be used) for the duration of the COVID-19 declaration under Section 564(b)(1) of the Act, 21 U.S.C.section 360bbb-3(b)(1), unless the authorization is terminated  or revoked sooner.       Influenza A by PCR NEGATIVE NEGATIVE Final   Influenza B by PCR NEGATIVE NEGATIVE Final    Comment: (NOTE) The Xpert Xpress SARS-CoV-2/FLU/RSV plus assay is intended as an aid in the diagnosis of influenza from Nasopharyngeal swab specimens and should not be used as a sole basis for treatment. Nasal washings and aspirates are unacceptable for Xpert Xpress SARS-CoV-2/FLU/RSV testing.  Fact Sheet for Patients: EntrepreneurPulse.com.au  Fact Sheet for Healthcare Providers: IncredibleEmployment.be  This test is not yet approved or cleared by the [place] FDA and has been authorized for detection and/or diagnosis of SARS-CoV-2 by FDA under an Emergency Use Authorization (EUA). This EUA will remain in effect (meaning this test can be used) for the duration of the COVID-19 declaration under Section 564(b)(1) of the Act, 21 U.S.C. section 360bbb-3(b)(1), unless the authorization is terminated or revoked.  Performed at [facility], [address], [place], [place] [address]   Culture, blood (routine x 2)     Status: None (Preliminary result)   Collection Time: 11/26/20  6:32 PM   Specimen: Right Antecubital; Blood  Result Value Ref Range Status   Specimen Description RIGHT ANTECUBITAL  Final   Special Requests   Final    Blood Culture results may not be optimal due to an inadequate volume of blood received in culture bottles BOTTLES DRAWN AEROBIC AND ANAEROBIC   Culture   Final    NO GROWTH < 24 HOURS Performed at [facility], [address] [place], [place] [address]    Report Status PENDING  Incomplete  Culture, blood (routine x 2)     Status: None (Preliminary result)   Collection Time: 11/26/20  6:42 PM   Specimen: Left  Antecubital; Blood  Result Value Ref Range Status   Specimen Description LEFT ANTECUBITAL  Final   Special Requests   Final    Blood Culture results may not be optimal due to an inadequate volume of blood received  in culture bottles BOTTLES DRAWN AEROBIC AND ANAEROBIC   Culture   Final    NO GROWTH < 24 HOURS Performed at [facility], [address], [place], [place] [address]    Report Status PENDING  Incomplete      Radiology Studies: CT HEAD WO CONTRAST  Result Date: 11/26/2020 CLINICAL DATA:  Initial evaluation for acute confusion. EXAM: CT HEAD WITHOUT CONTRAST TECHNIQUE: Contiguous axial images were obtained from the base of the skull through the vertex without intravenous contrast. COMPARISON:  Prior MRI from 07/11/2020 FINDINGS: Brain: Generalized age-related cerebral atrophy with moderate chronic small vessel ischemic disease. No acute intracranial hemorrhage. No acute large vessel territory infarct. No mass lesion, midline shift or mass effect. No hydrocephalus or extra-axial fluid collection. Vascular: No hyperdense vessel. Calcified atherosclerosis present at the skull base. Skull: Scalp soft tissues and calvarium within normal limits. Sinuses/Orbits: Globes and orbital soft tissues demonstrate no acute finding. Paranasal sinuses are clear. No mastoid effusion. Other: None. IMPRESSION: 1. No acute intracranial abnormality. 2. Generalized age-related cerebral atrophy with moderate chronic small vessel ischemic disease. Electronically Signed   By: Jeannine Boga M.D.   On: 11/26/2020 21:15       LOS: 1 day   [facility] Pager on [email or web address]  11/27/2020, 10:50 AM

## 2020-11-27 NOTE — Progress Notes (Addendum)
Initial Nutrition Assessment  DOCUMENTATION CODES:   Not applicable  INTERVENTION:  Ensure Enlive po daily- provides 350 kcal and 20 grams of protein  [place] yogurt with breakfast daily  Recommend obtain current weight  NUTRITION DIAGNOSIS:   Inadequate oral intake related to acute illness (acute metabolic encephalopathy) as evidenced by meal completion < 25%.   GOAL:  Patient will meet greater than or equal to 90% of their needs   MONITOR:  PO intake,Supplement acceptance,Labs,Skin,Weight trends  REASON FOR ASSESSMENT:   Malnutrition Screening Tool    ASSESSMENT: Patient is a 85 yo female with hx of HF, stroke, DM and dementia.  She presents from home with progressive dementia and concern for her safety due to wandering out of her house. Acute metabolic encephalopathy. Disposition SNF.   Patient is not hungry at lunch today and did not eat breakfast. Meal intake 0% today. She is at risk for malnutrition given her poor appetite.   Patient weight history is stable. 69-73 kg for most of the past 13 months.   Medications: Insulin, Seroquel  Antibiotic-rocephin  Labs: BMP Latest Ref Rng & Units 11/26/2020 10/09/2020 08/21/2020  Glucose 70 - 99 mg/dL 127(H) 80 149(H)  BUN 8 - 23 mg/dL 21 24(H) 14  Creatinine 0.44 - 1.00 mg/dL 1.02(H) 0.97 0.85  BUN/Creat Ratio 12 - 28 - - 16  Sodium 135 - 145 mmol/L 139 138 143  Potassium 3.5 - 5.1 mmol/L 4.5 4.5 4.0  Chloride 98 - 111 mmol/L 102 100 101  CO2 22 - 32 mmol/L 29 28 27   Calcium 8.9 - 10.3 mg/dL 9.8 9.0 9.3     NUTRITION - FOCUSED PHYSICAL EXAM:  Nutrition-Focused physical exam completed. Findings are mild buccal fat depletion, mild clavicle muscle depletion, and no edema.     Diet Order:   Diet Order            Diet regular Room service appropriate? Yes; Fluid consistency: Thin  Diet effective now                 EDUCATION NEEDS:  Not appropriate for education at this time  Skin:  Skin Assessment: Reviewed RN  Assessment  Last BM:  Prior to admission  Height:   Ht Readings from Last 1 Encounters:  11/26/20 5\' 4"  (1.626 m)    Weight:   Wt Readings from Last 1 Encounters:  11/26/20 73 kg    Ideal Body Weight:   55 kg  BMI:  Body mass index is 27.62 kg/m.  Estimated Nutritional Needs:   Kcal:  1700-1850  Protein:  85-90 gr  Fluid:  < 2 liters daily   Colman Cater MS,RD,CSG,LDN CONTACT: [email or web address]

## 2020-11-27 NOTE — Consult Note (Signed)
Consultation Note Date: 11/27/2020   Patient Name: Amy [name]  DOB: 08-14-1935  MRN: [id number]  Age / Sex: 85 y.o., female  PCP: [name], MD Referring Physician: Bonnielee Haff, MD  Reason for Consultation: Establishing goals of care and Psychosocial/spiritual support  HPI/Patient Profile: 85 y.o. female  with past medical history of dementia, stroke, systolic heart failure, DM, anxiety/depression, osteopenia, invasive ductal carcinoma of right breast, 2018 inguinal hernia repair, knee arthroscopy, shoulder surgery rotator cuff repair, 20-pack-year smoking history admitted on 11/26/2020 with progressive dementia versus acute metabolic encephalopathy, UTI.   Clinical Assessment and Goals of Care: I have reviewed medical records including EPIC notes, labs and imaging, received report from bedside nursing staff, examined the patient.  Amy [name] is lying quietly in bed.  She appears acutely/chronically ill and quite frail.  I asked her name, but she tells me, "just let me alone".  She declines to speak with me, and rolls over in the bed facing away from me.  I am not sure that she can make her basic needs known.  There is no family at bedside at this time.  I return later in the day.  Amy [name] is sitting up in the bed.  She will tell me her name, but continues to tell me that she is "hurting all over".  Nursing staff notified.  Amy [name] seems to be inconsolable.  There is no family at bedside at this time.  Call to son, Patrici Ranks to discuss diagnosis prognosis, [place], EOL wishes, disposition and options.  I introduced Palliative Medicine as specialized medical care for people living with serious illness. It focuses on providing relief from the symptoms and stress of a serious illness.   We discussed a brief life review of the patient.  Son Roderic Palau states that Mrs. Shimkus has suffered with  depression her entire life.  He states that she is alienated her family, and seems to be disengaged if she does not have a sitter there with her.  He feels that she is no longer safe to live alone.   I would agree.  We discussed her current illness and what it means in the larger context of her on-going co-morbidities.  Natural disease trajectory and expectations at EOL were discussed.  We talked about progression of dementia, what is normal and expected including, but not limited to, functional declines and nutritional declines.  Roderic Palau states that Education officer, museum at [employer] center is known to he and his family.  He shares that their goal is for Amy [name] to have short-term rehab, and go from there.  Whether she needs ALF or long-term care remains to be seen.  I share with Roderic Palau that he is wise to be prepared for possible outcomes.  Advanced directives, concepts specific to code status, artifical feeding and hydration, and rehospitalization were considered and briefly discussed.  Continue goals of care discussions with outpatient palliative services.  Roderic Palau shares that he has had difficulty with end-of-life discussions with Amy [name].  I share that  due to her advancing memory loss, she no longer has the ability to make these choices.  I share that he would be her surrogate decision maker, and due to her mental state, I would not burden her with difficult choices.  He agrees.  Hospice and Palliative Care services outpatient were explained and offered.  Roderic Palau is agreeable to outpatient palliative services to follow Amy [name].  [place].   Questions and concerns were addressed.  The family was encouraged to call with questions or concerns.   Conference with attending, bedside nursing staff, transition of care team related to patient condition, needs, goals of care.   HCPOA   NEXT OF KIN -son, Patrici Ranks    SUMMARY OF RECOMMENDATIONS   At  this point continue to treat the treatable but no CPR or intubation Short-term rehab at [facility] if will transition to ALF versus [place] outpatient palliative services to follow.   Code Status/Advance Care Planning:  DNR  Symptom Management:   Per hospitalist, no additional needs at this time.  Palliative Prophylaxis:   Frequent Pain Assessment, Oral Care and Turn Reposition  Additional Recommendations (Limitations, Scope, Preferences):  At this point continue to treat the treatable but no CPR or intubation.  Psycho-social/Spiritual:   Desire for further Chaplaincy support:no  Additional Recommendations: Caregiving  Support/Resources and Education on Hospice  Prognosis:   Unable to determine, based on outcomes.  Would benefit from "treat the treatable" hospice care.  Discharge Planning: To be determined, seems unsafe to go home, unsure if she is willing to participate in rehab      Primary Diagnoses: Present on Admission: . UTI (urinary tract infection) . Acute metabolic encephalopathy . Type 2 diabetes mellitus with neurological complications ([place]) . Cryptogenic stroke ([place]) . Chronic systolic CHF (congestive heart failure) ([place])   I have reviewed the medical record, interviewed the patient and family, and examined the patient. The following aspects are pertinent.  Past Medical History:  Diagnosis Date  . Anxiety   . Arthritis   . Back pain   . Colon polyps   . Dementia ([place])    early signs per daughter in law  . Depression   . Diabetes mellitus, type II ([place])    diet controlled  . Hypercholesterolemia   . Invasive ductal carcinoma of breast, female, right ([place])   . Osteopenia 11/26/2016  . Skin-picking disorder    has 3 open wounds on right wrist that are being treated. About a quarter in size.  . Stroke [facility])    Social History   Socioeconomic History  . Marital status: Divorced    Spouse name: Not on file  . Number of children:  Not on file  . Years of education: Not on file  . Highest education level: Not on file  Occupational History  . Occupation: Nurse    Comment: Retired  Tobacco Use  . Smoking status: Former Smoker    Packs/day: 0.50    Years: 40.00    Pack years: 20.00    Types: Cigarettes    Quit date: 09/04/1995    Years since quitting: 25.2  . Smokeless tobacco: Never Used  Vaping Use  . Vaping Use: Never used  Substance and Sexual Activity  . Alcohol use: No  . Drug use: No    Types: Benzodiazepines, Hydrocodone  . Sexual activity: Never  Other Topics Concern  . Not on file  Social History Narrative   Divorced.  Former smoker, quit in  1997.  Nondrinker.    Social Determinants of Health   Financial Resource Strain: Not on file  Food Insecurity: Not on file  Transportation Needs: Not on file  Physical Activity: Not on file  Stress: Not on file  Social Connections: Not on file   Family History  Problem Relation Age of Onset  . Anxiety disorder Mother   . Depression Mother   . Depression Maternal Grandmother   . Alcohol abuse Brother   . Cancer - Other Sister   . ADD / ADHD Neg Hx   . Bipolar disorder Neg Hx   . Dementia Neg Hx   . Drug abuse Neg Hx   . OCD Neg Hx   . Paranoid behavior Neg Hx   . Schizophrenia Neg Hx   . Seizures Neg Hx   . Sexual abuse Neg Hx   . Physical abuse Neg Hx    Scheduled Meds: . ALPRAZolam  0.5 mg Oral QHS  . aspirin EC  81 mg Oral q morning  . enoxaparin (LOVENOX) injection  40 mg Subcutaneous Q24H  . feeding supplement  237 mL Oral BID BM  . insulin aspart  0-5 Units Subcutaneous QHS  . insulin aspart  0-9 Units Subcutaneous TID WC  . metoprolol succinate  25 mg Oral Daily  . QUEtiapine  25 mg Oral BID   Continuous Infusions: . cefTRIAXone (ROCEPHIN)  IV     PRN Meds:.acetaminophen **OR** acetaminophen, haloperidol lactate, ondansetron **OR** ondansetron (ZOFRAN) IV, polyethylene glycol Medications Prior to Admission:  Prior to Admission  medications   Medication Sig Start Date End Date Taking? Authorizing Provider  Acetaminophen-Codeine 300-15 MG TABS Take 1 tablet by mouth 2 (two) times daily as needed. 11/02/20  Yes [provider]  ALPRAZolam Duanne Moron) 0.5 MG tablet Take 1 tablet (0.5 mg total) by mouth at bedtime. 09/21/20  Yes Cloria Spring, MD  aspirin EC 81 MG tablet Take 81 mg by mouth every morning.   Yes [provider]  calcium carbonate (OSCAL) 1500 (600 Ca) MG TABS tablet Take 600 mg of elemental calcium by mouth daily with supper.    Yes [provider]  cholecalciferol (VITAMIN D) 1000 units tablet Take 1 tablet (1,000 Units total) by mouth daily. 10/23/16  Yes Kefalas, Manon Hilding, PA-C  donepezil (ARICEPT) 5 MG tablet Take 1 tablet (5 mg total) by mouth daily. 09/21/20  Yes Cloria Spring, MD  escitalopram (LEXAPRO) 20 MG tablet TAKE 1 TABLET(20 MG) BY MOUTH DAILY Patient taking differently: Take 20 mg by mouth daily. 10/09/20  Yes Cloria Spring, MD  gabapentin (NEURONTIN) 100 MG capsule Take 200 mg by mouth at bedtime. 10/26/20  Yes [provider]  letrozole ([place]) 2.5 MG tablet TAKE 1 TABLET(2.5 MG) BY MOUTH DAILY 07/04/20  Yes Nicholas Lose, MD  metFORMIN (GLUCOPHAGE) 500 MG tablet Take 1 tablet (500 mg total) by mouth 2 (two) times daily with a meal. 04/09/19  Yes Gerlene Fee, NP  metoprolol succinate (TOPROL-XL) 25 MG 24 hr tablet Take 1 tablet (25 mg total) by mouth daily. Please keep upcoming appt in January 2022 before anymore refills. Thank you 06/29/20  Yes Fay Records, MD  oxybutynin (DITROPAN) 5 MG tablet Take 5 mg by mouth at bedtime. 11/30/19  Yes [provider]  QUEtiapine (SEROQUEL) 25 MG tablet Take 25 mg by mouth daily. 11/22/20  Yes [provider]  risperiDONE (RISPERDAL) 1 MG tablet Take 1 mg by mouth at bedtime. 11/11/20  Yes  [provider]  venlafaxine XR (EFFEXOR-XR) 150 MG 24 hr capsule TAKE 1 CAPSULE(150 MG) BY MOUTH TWICE DAILY  09/21/20  Yes Cloria Spring, MD  furosemide (LASIX) 20 MG tablet Take 2 tablets twice a day for three (3) days, then take 1 tablet once daily 5 days a week. Patient not taking: Reported on 11/27/2020 10/09/20   [name], PA-C  risperiDONE (RISPERDAL) 0.5 MG tablet Take 1 tablet (0.5 mg total) by mouth at bedtime. Patient not taking: Reported on 11/27/2020 09/21/20   Cloria Spring, MD   Allergies  Allergen Reactions  . Cymbalta [Duloxetine Hcl] Other (See Comments)    Hallucinated and couldn't think; got worse and worse the longer she took this.  . Duloxetine    Review of Systems  Unable to perform ROS: Dementia    Physical Exam Vitals and nursing note reviewed.  Constitutional:      General: She is in acute distress.     Appearance: She is ill-appearing.  HENT:     Head: Atraumatic.     Mouth/Throat:     Mouth: Mucous membranes are dry.  Cardiovascular:     Rate and Rhythm: Normal rate.  Pulmonary:     Effort: Pulmonary effort is normal. No respiratory distress.  Skin:    General: Skin is warm and dry.  Neurological:     Mental Status: She is alert.     Comments: Does not answer questions  Psychiatric:     Comments: Fearful and withdrawn     Vital Signs: BP (!) 154/77 (BP Location: Left Arm)   Pulse 79   Temp (!) 97.5 F (36.4 C) (Oral)   Resp 18   Ht 5\' 4"  (1.626 m)   Wt 73 kg   SpO2 100%   BMI 27.62 kg/m  Pain Scale: PAINAD   Pain Score: 0-No pain   SpO2: SpO2: 100 % O2 Device:SpO2: 100 % O2 Flow Rate: .   IO: Intake/output summary:   Intake/Output Summary (Last 24 hours) at 11/27/2020 1418 Last data filed at 11/27/2020 1300 Gross per 24 hour  Intake 1080 ml  Output 330 ml  Net 750 ml    LBM:   Baseline Weight: Weight: 73 kg Most recent weight: Weight: 73 kg     Palliative Assessment/Data:   Flowsheet Rows   Flowsheet Row Most Recent Value  Intake Tab   Referral Department Hospitalist  Unit at Time of Referral Med/Surg Unit  Palliative  Care Primary Diagnosis Sepsis/Infectious Disease  Date Notified 11/27/20  Palliative Care Type New Palliative care  Reason for referral Clarify Goals of Care  Date of Admission 11/26/20  Date first seen by Palliative Care 11/27/20  # of days Palliative referral response time 0 Day(s)  # of days IP prior to Palliative referral 1  Clinical Assessment   Palliative Performance Scale Score 20%  Pain Max last 24 hours Not able to report  Pain Min Last 24 hours Not able to report  Dyspnea Max Last 24 Hours Not able to report  Dyspnea Min Last 24 hours Not able to report  Psychosocial & Spiritual Assessment   Palliative Care Outcomes       Time In: 1320 Time Out: 1430 Time Total: 70 minutes  Greater than 50%  of this time was spent counseling and coordinating care related to the above assessment and plan.  Signed by: Drue Novel, NP   Please contact Palliative Medicine Team phone at [phone] for questions and concerns.  For individual provider: See Shea Evans

## 2020-11-27 NOTE — TOC Initial Note (Signed)
Transition of Care [facility]) - Initial/Assessment Note    Patient Details  Name: Amy [name] MRN: [id number] Date of Birth: 11/23/1935  Transition of [facility]) CM/SW Contact:    Iona Beard, [place] Phone Number: 11/27/2020, 2:32 PM  Clinical Narrative:                 [facility] consulted for SNF placement. CSW spoke with pts son Patrici Ranks [facility]) to complete assessment. Per Mr. Ronnald Ramp pts dementia is progressing and she is unable to return home as she has been living alone and it is no longer safe for pt. Before being brought to the hospital pt had walked across the road looking for her mother who has passed away many years ago. Per Mr. Ronnald Ramp the family would be interested in ALF placement, CSW spoke about ALF being private pay and inquired about how independent/ambulatory pt is. Per Mr. Ronnald Ramp pt is not able to bathe herself and cannot fix her own food. Pt can go to the bathroom but chooses not to when her caregiver is not in the home. Pt has a caregiver M-F from 9-2. CSW reached out to [facility] who states they have one case of COVID but they are still able to admit new residents. However, CSW informed that PT is recommending SNF. Per Mr. Ronnald Ramp they would be agreeable with pt going to [facility] for SNF and then working with SNF for further needs. TOC to follow.   Expected Discharge Plan: Skilled Nursing Facility Barriers to Discharge: Continued Medical Work up   Patient Goals and CMS Choice Patient states their goals for this hospitalization and ongoing recovery are:: Deciding between SNF vs ALF CMS Medicare.gov Compare Post Acute Care list provided to:: Patient Represenative (must comment) Choice offered to / list presented to : [place] / Guardian  Expected Discharge Plan and Services Expected Discharge Plan: Skilled Nursing Facility In-house Referral: Clinical Social Work Discharge Planning Services: CM Consult Post Acute Care Choice: [place]                    DME Arranged: N/A DME Agency: NA       HH Arranged: NA HH Agency: NA        Prior Living Arrangements/Services   Lives with:: Self Patient language and need for interpreter reviewed:: Yes Do you feel safe going back to the place where you live?: Yes      Need for Family Participation in Patient Care: Yes (Comment) Care giver support system in place?: Yes (comment) Current home services: DME,Sitter Criminal Activity/Legal Involvement Pertinent to Current Situation/Hospitalization: No - Comment as needed  Activities of Daily Living   ADL Screening (condition at time of admission) Patient's cognitive ability adequate to safely complete daily activities?: No Is the patient deaf or have difficulty hearing?: Yes Does the patient have difficulty seeing, even when wearing glasses/contacts?: No Does the patient have difficulty concentrating, remembering, or making decisions?: Yes Patient able to express need for assistance with ADLs?: No Does the patient have difficulty walking or climbing stairs?: Yes Weakness of Legs: Both Weakness of Arms/Hands: Both  Permission Sought/Granted                  Emotional Assessment Appearance:: Appears stated age Attitude/Demeanor/Rapport: Unable to Assess Affect (typically observed): Unable to Assess Orientation: : Fluctuating Orientation (Suspected and/or reported [place]owners) Alcohol / Substance Use: Not Applicable Psych Involvement: No (comment)  Admission diagnosis:  UTI (urinary tract infection) [N39.0]  Urinary tract infection in female [N39.0] Dementia without behavioral disturbance, unspecified dementia type ([place]) [F03.90] Patient Active Problem List   Diagnosis Date Noted  . Sacral fracture, closed ([place]) 09/05/2020  . Body mass index (BMI) 27.0-27.9, adult 09/05/2020  . Elevated blood-pressure reading, without diagnosis of hypertension 09/05/2020  . Confusion 07/12/2020  . Postprocedural seroma of skin and subcutaneous  tissue following other procedure   . Difficulty with speech 07/10/2020  . Lymphadenopathy, inguinal   . Lymphadenopathy, retroperitoneal 02/16/2020  . Cryptogenic stroke ([place]) 10/21/2019  . Palpitations 10/21/2019  . Cognitive impairment   . UTI (urinary tract infection) 10/05/2019  . Dehydration   . Major depressive disorder, recurrent episode, mild with anxious distress ([place])   . Acute lower UTI   . Deficiency of vitamin B12   . Physical deconditioning   . Acute metabolic encephalopathy 55/73/2202  . Acute encephalopathy 08/16/2019  . Mild renal insufficiency 08/16/2019  . Chronic systolic CHF (congestive heart failure) ([place]) 08/16/2019  . Cardiac LV ejection fraction 30-35% 03/29/2019  . Dyslipidemia associated with type 2 diabetes mellitus ([place]) 03/29/2019  . Major depression with psychotic features ([place]) 03/29/2019  . History of CVA (cerebrovascular accident) 03/20/2019  . Pseudoarthrosis of cervical spine ([place]) 03/10/2019  . Edema of both lower legs due to peripheral venous insufficiency 10/12/2018  . Anemia 05/12/2018  . S/P total knee replacement, right 05/05/18 05/12/2018  . Type 2 diabetes mellitus with neurological complications ([place]) 54/27/0623  . Primary osteoarthritis of right knee   . Lower limb pain, inferior, left 04/02/2018  . Status post surgery 01/26/2018  . Surgery, elective 01/26/2018  . Degenerative spondylolisthesis 01/26/2018  . Adult idiopathic generalized osteoporosis 09/25/2017  . Postmenopausal osteoporosis 09/25/2017  . Postmenopausal state 09/25/2017  . Acquired scoliosis 08/14/2017  . Osteoporosis 12/13/2016  . Osteopenia 11/26/2016  . Invasive ductal carcinoma of breast, female, right ([place])   . Lumbago 03/22/2014  . Stiffness of joint, not elsewhere classified, pelvic region and thigh 03/22/2014  . Muscle weakness (generalized) 03/22/2014  . Abnormality of gait 03/22/2014  . Depression with anxiety 09/03/2012  . Insomnia secondary to  depression with anxiety 09/03/2012  . Pain 09/03/2012   PCP:  [name], MD Pharmacy:   [facility], [place] [place] [address] Phone: [phone] Fax: [phone]     Social Determinants of Health (SDOH) Interventions    Readmission Risk Interventions Readmission Risk Prevention Plan 11/27/2020 08/19/2019  Transportation Screening Complete Complete  PCP or Specialist Appt within 3-5 Days - Not Complete  HRI or [place] Complete Complete  Social Work Consult for [place] Planning/Counseling Complete Complete  Palliative Care Screening Not Applicable Not Complete  Medication Review Press photographer) Complete Complete  Some recent data might be hidden

## 2020-11-27 NOTE — Evaluation (Signed)
Physical Therapy Evaluation Patient Details Name: Amy [name] MRN: [id number] DOB: June 11, 1936 Today's Date: 11/27/2020   History of Present Illness  Amy [name] is a 85 y.o. female with medical history significant for dementia, stroke, systolic CHF, diabetes mellitus.  Patient was brought to the ED via EMS.  History is obtained from chart review and from talking to patient's son Amy [name] on the phone, who is patient's healthcare power of attorney. As at the time of my evaluation, patient is confused.,  Able to tell me her name but not where she is, telling me she came to the hospital because of her mother,    Clinical Impression  Patient demonstrates slow labored movement for sitting up at bedside, unsteady on feet requiring use of RW due to generalized weakness/fatigue and limited to ambulation in room.  Patient tolerated sitting up in chair after therapy - nursing staff notified.  Patient will benefit from continued physical therapy in hospital and recommended venue below to increase strength, balance, endurance for safe ADLs and gait.      Follow Up Recommendations SNF;Supervision for mobility/OOB;Supervision/Assistance - 24 hour    Equipment Recommendations  Rolling walker with 5" wheels    Recommendations for Other Services       Precautions / Restrictions Precautions Precautions: Fall Restrictions Weight Bearing Restrictions: No      Mobility  Bed Mobility Overal bed mobility: Needs Assistance Bed Mobility: Supine to Sit;Sit to Supine     Supine to sit: Min assist Sit to supine: Mod assist   General bed mobility comments: increased time, labored movement    Transfers Overall transfer level: Needs assistance Equipment used: Rolling walker (2 wheeled) Transfers: Sit to/from [facility] Sit to Stand: Min assist Stand pivot transfers: Min assist       General transfer comment: slow labored  movement  Ambulation/Gait Ambulation/Gait assistance: Mod assist;Min assist Gait Distance (Feet): 15 Feet Assistive device: Rolling walker (2 wheeled) Gait Pattern/deviations: Decreased step length - left;Decreased stance time - right;Decreased stride length Gait velocity: decreased   General Gait Details: very unsteady on feet having to lean on nearby objects for support, required use of RW for safety, limited mostly due to fatigue  Stairs            Wheelchair Mobility    Modified Rankin (Stroke Patients Only)       Balance Overall balance assessment: Needs assistance Sitting-balance support: Feet supported;No upper extremity supported Sitting balance-Leahy Scale: Good Sitting balance - Comments: seated at EOB   Standing balance support: During functional activity;No upper extremity supported Standing balance-Leahy Scale: Poor Standing balance comment: fair using RW                             Pertinent Vitals/Pain Pain Assessment: No/denies pain    Home Living Family/patient expects to be discharged to:: Private residence Living Arrangements: Alone Available Help at Discharge: Family;Available PRN/intermittently;Other (Comment) Type of Home: House Home Access: Stairs to enter Entrance Stairs-Rails: Right Entrance Stairs-Number of Steps: 2 Home Layout: Two level;Able to live on main level with bedroom/bathroom Home Equipment: Gilford Rile - 2 wheels;Cane - single point;Bedside commode;Tub bench      Prior Function Level of Independence: Needs assistance   Gait / Transfers Assistance Needed: household ambulator with Rollator  ADL's / Homemaking Assistance Needed: assisted by family for community ADLs and some household, has caregiver during the day 6 days/week  Hand Dominance   Dominant Hand: Right    Extremity/Trunk Assessment   Upper Extremity Assessment Upper Extremity Assessment: Generalized weakness    Lower Extremity  Assessment Lower Extremity Assessment: Generalized weakness    Cervical / Trunk Assessment Cervical / Trunk Assessment: Kyphotic  Communication   Communication: Expressive difficulties;Other (comment) (Had difficulty understanding patient)  Cognition Arousal/Alertness: Awake/alert Behavior During Therapy: WFL for tasks assessed/performed Overall Cognitive Status: History of cognitive impairments - at baseline                                        General Comments      Exercises     Assessment/Plan    PT Assessment Patient needs continued PT services  PT Problem List Decreased strength;Decreased activity tolerance;Decreased balance;Decreased mobility       PT Treatment Interventions DME instruction;Gait training;Functional mobility training;Therapeutic activities;Therapeutic exercise;Stair training;Balance training;Patient/family education    PT Goals (Current goals can be found in the Care Plan section)  Acute Rehab PT Goals Patient Stated Goal: return home PT Goal Formulation: With patient Time For Goal Achievement: 12/11/20 Potential to Achieve Goals: Good    Frequency Min 3X/week   Barriers to discharge        Co-evaluation               AM-PAC PT "6 Clicks" Mobility  Outcome Measure Help needed turning from your back to your side while in a flat bed without using bedrails?: A Little Help needed moving from lying on your back to sitting on the side of a flat bed without using bedrails?: A Lot Help needed moving to and from a bed to a chair (including a wheelchair)?: A Lot Help needed standing up from a chair using your arms (e.g., wheelchair or bedside chair)?: A Little Help needed to walk in hospital room?: A Lot Help needed climbing 3-5 steps with a railing? : A Lot 6 Click Score: 14    End of Session   Activity Tolerance: Patient tolerated treatment well;Patient limited by fatigue Patient left: in chair;with call bell/phone within  reach Nurse Communication: Mobility status PT Visit Diagnosis: Unsteadiness on feet (R26.81);Other abnormalities of gait and mobility (R26.89);Muscle weakness (generalized) (M62.81)    Time: 9326-7124 PT Time Calculation (min) (ACUTE ONLY): 28 min   Charges:   PT Evaluation $PT Eval Moderate Complexity: 1 Mod PT Treatments $Therapeutic Activity: 23-37 mins        2:45 PM, 11/27/20 Amy [name], MPT Physical Therapist with [facility] 336 [phone] office [phone] mobile phone

## 2020-11-27 NOTE — Progress Notes (Signed)
EEG Completed; Results Pending  

## 2020-11-27 NOTE — Procedures (Signed)
Patient Name: Amy [name]  MRN: [id number]  Epilepsy Attending: Lora Havens  Referring Physician/Provider: Dr Bonnielee Haff Date: 11/27/2020 Duration: 23.57 mins  Patient history: 85yo F with ams. EEG to evaluate for seizure.  Level of alertness: Awake  AEDs during EEG study: Xanax  Technical aspects: This EEG study was done with scalp electrodes positioned according to the 10-20 International system of electrode placement. Electrical activity was acquired at a sampling rate of 500Hz  and reviewed with a high frequency filter of 70Hz  and a low frequency filter of 1Hz . EEG data were recorded continuously and digitally stored.   Description: The posterior dominant rhythm consists of 8 Hz activity of moderate voltage (25-35 uV) seen predominantly in posterior head regions, symmetric and reactive to eye opening and eye closing. EEG showed intermittent generalized 3 to 6 Hz theta-delta slowing. Hyperventilation and photic stimulation were not performed.     ABNORMALITY - Intermittent slow, generalized  IMPRESSION: This study is suggestive of mild diffuse encephalopathy, nonspecific etiology. No seizures or epileptiform discharges were seen throughout the recording.  [name]

## 2020-11-27 NOTE — NC FL2 (Signed)
[place] LEVEL OF CARE SCREENING TOOL     IDENTIFICATION  Patient Name: Amy [name] Birthdate: 11/26/1935 Sex: female Admission Date (Current Location): 11/26/2020  [facility] and [place] Number:  [employer] and Address:  [place] [address] [place]      Provider Number: [id number]  Attending Physician Name and Address:  Bonnielee Haff, MD  Relative Name and Phone Number:  Azzie Glatter)   [phone]    Current Level of Care: Hospital Recommended Level of Care: [place] Prior Approval Number:    Date Approved/Denied:   PASRR Number:    Discharge Plan: SNF    Current Diagnoses: Patient Active Problem List   Diagnosis Date Noted  . Sacral fracture, closed (Home) 09/05/2020  . Body mass index (BMI) 27.0-27.9, adult 09/05/2020  . Elevated blood-pressure reading, without diagnosis of hypertension 09/05/2020  . Confusion 07/12/2020  . Postprocedural seroma of skin and subcutaneous tissue following other procedure   . Difficulty with speech 07/10/2020  . Lymphadenopathy, inguinal   . Lymphadenopathy, retroperitoneal 02/16/2020  . Cryptogenic stroke ([place]) 10/21/2019  . Palpitations 10/21/2019  . Cognitive impairment   . UTI (urinary tract infection) 10/05/2019  . Dehydration   . Major depressive disorder, recurrent episode, mild with anxious distress ([place])   . Acute lower UTI   . Deficiency of vitamin B12   . Physical deconditioning   . Acute metabolic encephalopathy 84/69/6295  . Acute encephalopathy 08/16/2019  . Mild renal insufficiency 08/16/2019  . Chronic systolic CHF (congestive heart failure) ([place]) 08/16/2019  . Cardiac LV ejection fraction 30-35% 03/29/2019  . Dyslipidemia associated with type 2 diabetes mellitus ([place]) 03/29/2019  . Major depression with psychotic features ([place]) 03/29/2019  . History of CVA (cerebrovascular accident) 03/20/2019  . Pseudoarthrosis of cervical  spine ([place]) 03/10/2019  . Edema of both lower legs due to peripheral venous insufficiency 10/12/2018  . Anemia 05/12/2018  . S/P total knee replacement, right 05/05/18 05/12/2018  . Type 2 diabetes mellitus with neurological complications ([place]) 28/41/3244  . Primary osteoarthritis of right knee   . Lower limb pain, inferior, left 04/02/2018  . Status post surgery 01/26/2018  . Surgery, elective 01/26/2018  . Degenerative spondylolisthesis 01/26/2018  . Adult idiopathic generalized osteoporosis 09/25/2017  . Postmenopausal osteoporosis 09/25/2017  . Postmenopausal state 09/25/2017  . Acquired scoliosis 08/14/2017  . Osteoporosis 12/13/2016  . Osteopenia 11/26/2016  . Invasive ductal carcinoma of breast, female, right ([place])   . Lumbago 03/22/2014  . Stiffness of joint, not elsewhere classified, pelvic region and thigh 03/22/2014  . Muscle weakness (generalized) 03/22/2014  . Abnormality of gait 03/22/2014  . Depression with anxiety 09/03/2012  . Insomnia secondary to depression with anxiety 09/03/2012  . Pain 09/03/2012    Orientation RESPIRATION BLADDER Height & Weight     Self  Normal External catheter Weight: 160 lb 15 oz (73 kg) Height:  5\' 4"  (162.6 cm)  BEHAVIORAL SYMPTOMS/MOOD NEUROLOGICAL BOWEL NUTRITION STATUS      Continent Diet (See d/c summary)  AMBULATORY STATUS COMMUNICATION OF NEEDS Skin   Extensive Assist Verbally Normal                       Personal Care Assistance Level of Assistance  Feeding,Bathing,Dressing,Total care Bathing Assistance: Maximum assistance Feeding assistance: Limited assistance Dressing Assistance: Maximum assistance Total Care Assistance: Maximum assistance   Functional Limitations Info  Hearing,Sight,Speech Sight Info: Impaired Hearing Info: Adequate Speech Info: Adequate  SPECIAL CARE FACTORS FREQUENCY  PT (By licensed PT),OT (By licensed OT)     PT Frequency: 5 times weekly OT Frequency: 5 times weekly             Contractures Contractures Info: Not present    Additional Factors Info  Code Status,Allergies Code Status Info: DNR Allergies Info: Cymbalta (duloxetine hcl) and Duloxetine           Current Medications (11/27/2020):  This is the current hospital active medication list Current Facility-Administered Medications  Medication Dose Route Frequency Provider Last Rate Last Admin  . acetaminophen (TYLENOL) tablet 650 mg  650 mg Oral Q6H PRN Emokpae, Ejiroghene E, MD       Or  . acetaminophen (TYLENOL) suppository 650 mg  650 mg Rectal Q6H PRN Emokpae, Ejiroghene E, MD      . ALPRAZolam (XANAX) tablet 0.5 mg  0.5 mg Oral QHS Emokpae, Ejiroghene E, MD      . aspirin EC tablet 81 mg  81 mg Oral q morning Emokpae, Ejiroghene E, MD      . cefTRIAXone (ROCEPHIN) 1 g in sodium chloride 0.9 % 100 mL IVPB  1 g Intravenous Q24H Emokpae, Ejiroghene E, MD      . enoxaparin (LOVENOX) injection 40 mg  40 mg Subcutaneous Q24H Emokpae, Ejiroghene E, MD      . feeding supplement (ENSURE ENLIVE / ENSURE PLUS) liquid 237 mL  237 mL Oral BID BM Emokpae, Ejiroghene E, MD      . haloperidol lactate (HALDOL) injection 1 mg  1 mg Intravenous Q6H PRN Bonnielee Haff, MD   1 mg at 11/27/20 1112  . insulin aspart (novoLOG) injection 0-5 Units  0-5 Units Subcutaneous QHS Emokpae, Ejiroghene E, MD      . insulin aspart (novoLOG) injection 0-9 Units  0-9 Units Subcutaneous TID WC Emokpae, Ejiroghene E, MD   2 Units at 11/27/20 1117  . metoprolol succinate (TOPROL-XL) 24 hr tablet 25 mg  25 mg Oral Daily Emokpae, Ejiroghene E, MD      . ondansetron (ZOFRAN) tablet 4 mg  4 mg Oral Q6H PRN Emokpae, Ejiroghene E, MD       Or  . ondansetron (ZOFRAN) injection 4 mg  4 mg Intravenous Q6H PRN Emokpae, Ejiroghene E, MD      . polyethylene glycol (MIRALAX / GLYCOLAX) packet 17 g  17 g Oral Daily PRN Emokpae, Ejiroghene E, MD      . QUEtiapine (SEROQUEL) tablet 25 mg  25 mg Oral BID Bonnielee Haff, MD         Discharge  Medications: Please see discharge summary for a list of discharge medications.  Relevant Imaging Results:  Relevant Lab Results:   Additional Information SS#: [id number]  Iona Beard, LCSWA

## 2020-11-28 LAB — CBC
HCT: 41.2 % (ref 36.0–46.0)
Hemoglobin: 13.4 g/dL (ref 12.0–15.0)
MCH: 29 pg (ref 26.0–34.0)
MCHC: 32.5 g/dL (ref 30.0–36.0)
MCV: 89.2 fL (ref 80.0–100.0)
Platelets: 230 10*3/uL (ref 150–400)
RBC: 4.62 MIL/uL (ref 3.87–5.11)
RDW: 12.7 % (ref 11.5–15.5)
WBC: 8 10*3/uL (ref 4.0–10.5)
nRBC: 0 % (ref 0.0–0.2)

## 2020-11-28 LAB — GLUCOSE, CAPILLARY
Glucose-Capillary: 141 mg/dL — ABNORMAL HIGH (ref 70–99)
Glucose-Capillary: 124 mg/dL — ABNORMAL HIGH (ref 70–99)
Glucose-Capillary: 125 mg/dL — ABNORMAL HIGH (ref 70–99)
Glucose-Capillary: 135 mg/dL — ABNORMAL HIGH (ref 70–99)

## 2020-11-28 LAB — COMPREHENSIVE METABOLIC PANEL
ALT: 12 U/L (ref 0–44)
AST: 19 U/L (ref 15–41)
Albumin: 4 g/dL (ref 3.5–5.0)
Alkaline Phosphatase: 38 U/L (ref 38–126)
Anion gap: 10 (ref 5–15)
BUN: 13 mg/dL (ref 8–23)
CO2: 27 mmol/L (ref 22–32)
Calcium: 8.9 mg/dL (ref 8.9–10.3)
Chloride: 101 mmol/L (ref 98–111)
Creatinine, Ser: 0.73 mg/dL (ref 0.44–1.00)
GFR, Estimated: >60 mL/min (ref >60)
Glucose, Bld: 127 mg/dL — ABNORMAL HIGH (ref 70–99)
Potassium: 3.4 mmol/L — ABNORMAL LOW (ref 3.5–5.1)
Sodium: 138 mmol/L (ref 135–145)
Total Bilirubin: 1 mg/dL (ref 0.3–1.2)
Total Protein: 6.6 g/dL (ref 6.5–8.1)

## 2020-11-28 LAB — FOLATE: Folate: 12.5 ng/mL (ref >5.9)

## 2020-11-28 LAB — HEMOGLOBIN A1C
Hgb A1c MFr Bld: 6.2 % — ABNORMAL HIGH (ref 4.8–5.6)
Mean Plasma Glucose: 131.24 mg/dL

## 2020-11-28 LAB — T4, FREE: Free T4: 1.08 ng/dL (ref 0.61–1.12)

## 2020-11-28 LAB — TSH: TSH: 2.074 u[IU]/mL (ref 0.350–4.500)

## 2020-11-28 MED ORDER — POTASSIUM CHLORIDE 20 MEQ PO PACK
20.0000 meq | PACK | Freq: Two times a day (BID) | ORAL | Status: AC
  Administered 2020-11-28 (×2): 20 meq via ORAL
  Filled 2020-11-28 (×2): qty 1

## 2020-11-28 MED ORDER — ESCITALOPRAM OXALATE 10 MG PO TABS
20.0000 mg | ORAL_TABLET | Freq: Every day | ORAL | Status: DC
  Administered 2020-11-28 – 2020-12-01 (×4): 20 mg via ORAL
  Filled 2020-11-28 (×4): qty 2

## 2020-11-28 MED ORDER — DONEPEZIL HCL 5 MG PO TABS
5.0000 mg | ORAL_TABLET | Freq: Every day | ORAL | Status: DC
  Administered 2020-11-28 – 2020-12-01 (×4): 5 mg via ORAL
  Filled 2020-11-28 (×4): qty 1

## 2020-11-28 MED ORDER — RISPERIDONE 1 MG PO TABS
1.0000 mg | ORAL_TABLET | Freq: Every day | ORAL | Status: DC
  Administered 2020-11-28 – 2020-11-30 (×3): 1 mg via ORAL
  Filled 2020-11-28 (×3): qty 1

## 2020-11-28 NOTE — Progress Notes (Signed)
[facility] PROGRESS NOTE   Amy [name] [id number] DOB: [birth year] DOA: 11/26/2020  PCP: [name], MD  Brief History/Interval Summary: 85 y.o. female with medical history significant for dementia, stroke, systolic CHF, diabetes mellitus. Patient was brought to the ED via EMS.    History was primarily obtained from patient's son who is the patient's healthcare power of attorney.  Apparently patient has been wandering out of her house.  She has been progressively getting worse with her dementia.  Has not been able to take care of herself at home.  She lives by herself.  There was concern for medical reasons for acute worsening is her mentation.  She was brought into the ED.  She was hospitalized for further management due to concern for UTI.  Consultants: Palliative care  Procedures: None  Antibiotics: Anti-infectives (From admission, onward)   Start     Dose/Rate Route Frequency Ordered Stop   11/27/20 1800  cefTRIAXone (ROCEPHIN) 1 g in sodium chloride 0.9 % 100 mL IVPB        1 g 200 mL/hr over 30 Minutes Intravenous Every 24 hours 11/26/20 2138     11/26/20 1800  cefTRIAXone (ROCEPHIN) 1 g in sodium chloride 0.9 % 100 mL IVPB        1 g 200 mL/hr over 30 Minutes Intravenous  Once 11/26/20 1755 11/26/20 1946      Subjective/Interval History: Patient remains distracted and agitated.  Does not answer any questions appropriately.      Assessment/Plan:  Progressive dementia versus acute metabolic encephalopathy CT head did not show any acute findings.  Age-related atrophy was noted.  UA was noted to be abnormal and the patient was started on antibiotics.   Patient's metabolic work-up has been unremarkable so far including normal folic acid level and normal thyroid function test. Patient's vitamin B12 level was 1435 in December 2021.  RPR was negative last year.  EEG was also done which did not show any epileptiform activity. Patient was quite agitated yesterday and  was trying to get out of the bed.  She was given Haldol and Seroquel with improvement.  Home medication list reviewed.  Will add back the Risperdal that she was taking at nighttime.   Hoping that patient mentation improves as her UTI is treated.   Urinary tract infection with E. coli UA was noted to be abnormal.  She was started on ceftriaxone.  Urine culture growing E. coli.  Waiting on final sensitivities.  Continue ceftriaxone.    Chronic systolic CHF Echocardiogram from March 2021 showed EF to be 35 to 40% with grade 1 diastolic dysfunction.   Continue metoprolol.  Also noted to be on aspirin.  Seems to be euvolemic.  Diabetes mellitus type 2 CBGs are reasonably well controlled.  HbA1c 6.2.  Continue SSI.  On metformin at home which is currently on hold.  History of paroxysmal atrial fibrillation Apparently caught on loop recorder.  On beta-blocker.  Not on anticoagulation due to frequent falls and dementia.  Essential hypertension/hypokalemia Continue metoprolol.  Reasonably well controlled.  Occasional high readings likely due to agitation.  Replace potassium.  Check magnesium.  History of stage I A breast cancer involving right breast She is status postlumpectomy in 2018.  Currently on letrozole.  Follows with oncology at the [facility].  History of cryptogenic stroke On aspirin.  Not noted to be on statin.  Goals of care Appreciate palliative care assistance.  Plan is for patient to go to  skilled nursing facility for rehab with close follow-up by palliative care.   DVT Prophylaxis: Lovenox Code Status: DNR Family Communication: No family at bedside.  Will update son later today. Disposition Plan: Will likely need to go to skilled nursing facility as she is an unsafe discharge  Status is: Inpatient  Remains inpatient appropriate because:Altered mental status, Unsafe d/c plan, IV treatments appropriate due to intensity of illness or inability to take PO and  Inpatient level of care appropriate due to severity of illness   Dispo: The patient is from: Home              Anticipated d/c is to: To be determined              Patient currently is not medically stable to d/c.   Difficult to place patient No      Medications:  Scheduled: . ALPRAZolam  0.5 mg Oral QHS  . aspirin EC  81 mg Oral q morning  . donepezil  5 mg Oral Daily  . enoxaparin (LOVENOX) injection  40 mg Subcutaneous Q24H  . escitalopram  20 mg Oral Daily  . feeding supplement  237 mL Oral BID BM  . insulin aspart  0-5 Units Subcutaneous QHS  . insulin aspart  0-9 Units Subcutaneous TID WC  . metoprolol succinate  25 mg Oral Daily  . potassium chloride  20 mEq Oral BID  . QUEtiapine  25 mg Oral BID  . risperiDONE  1 mg Oral QHS   Continuous: . cefTRIAXone (ROCEPHIN)  IV 200 mL/hr at 11/27/20 1758   [facility]:WEXHBZJIRCVEL **OR** acetaminophen, haloperidol lactate, ondansetron **OR** ondansetron (ZOFRAN) IV, polyethylene glycol   Objective:  Vital Signs  Vitals:   11/27/20 1030 11/27/20 2041 11/28/20 0642 11/28/20 0811  BP: (!) 154/77 (!) 159/97 (!) 139/103 (!) 149/64  Pulse: 79 (!) 103 73 100  Resp:  18 15   Temp: (!) 97.5 F (36.4 C) 97.8 F (36.6 C) 98.2 F (36.8 C)   TempSrc: Oral  Oral   SpO2: 100% 99% 96%   Weight:      Height:        Intake/Output Summary (Last 24 hours) at 11/28/2020 1150 Last data filed at 11/28/2020 0900 Gross per 24 hour  Intake 1000.7 ml  Output --  Net 1000.7 ml   Filed Weights   11/26/20 1138  Weight: 73 kg    General appearance: Noted to be awake alert.  Confused and slightly agitated.  Less so compared to yesterday. Resp: Clear to auscultation bilaterally.  Normal effort Cardio: S1-S2 is normal regular.  No S3-S4.  No rubs murmurs or bruit GI: Abdomen is soft.  Nontender nondistended.  Bowel sounds are present normal.  No masses organomegaly Extremities: No edema.  Neurologic:   No focal neurological deficits.     Lab  Results:  Data Reviewed: I have personally reviewed following labs and imaging studies  CBC: Recent Labs  Lab 11/26/20 1214 11/28/20 0404  WBC 5.8 8.0  NEUTROABS 4.2  --   HGB 13.4 13.4  HCT 41.7 41.2  MCV 91.2 89.2  PLT 220 381    Basic Metabolic Panel: Recent Labs  Lab 11/26/20 1214 11/28/20 0404  NA 139 138  K 4.5 3.4*  CL 102 101  CO2 29 27  GLUCOSE 127* 127*  BUN 21 13  CREATININE 1.02* 0.73  CALCIUM 9.8 8.9    GFR: Estimated Creatinine Clearance: 53.1 mL/min (by C-G formula based on SCr of 0.73 mg/dL).  Liver Function Tests: Recent Labs  Lab 11/26/20 1214 11/28/20 0404  AST 20 19  ALT 13 12  ALKPHOS 43 38  BILITOT 0.5 1.0  PROT 6.5 6.6  ALBUMIN 3.7 4.0     BNP (last 3 results) Recent Labs    08/21/20 0958  PROBNP 120    HbA1C: Recent Labs    11/28/20 0404  HGBA1C 6.2*    CBG: Recent Labs  Lab 11/27/20 1114 11/27/20 1613 11/27/20 2045 11/28/20 0732 11/28/20 1103  GLUCAP 165* 185* 118* 141* 124*     Recent Results (from the past 240 hour(s))  Resp Panel by RT-PCR (Flu A&B, Covid) Nasopharyngeal Swab     Status: None   Collection Time: 11/26/20 12:49 PM   Specimen: Nasopharyngeal Swab; Nasopharyngeal(NP) swabs in vial transport medium  Result Value Ref Range Status   SARS Coronavirus 2 by RT PCR NEGATIVE NEGATIVE Final    Comment: (NOTE) SARS-CoV-2 target nucleic acids are NOT DETECTED.  The SARS-CoV-2 RNA is generally detectable in upper respiratory specimens during the acute phase of infection. The lowest concentration of SARS-CoV-2 viral copies this assay can detect is 138 copies/mL. A negative result does not preclude SARS-Cov-2 infection and should not be used as the sole basis for treatment or other patient management decisions. A negative result may occur with  improper specimen collection/handling, submission of specimen other than nasopharyngeal swab, presence of viral mutation(s) within the areas targeted by this  assay, and inadequate number of viral copies(<138 copies/mL). A negative result must be combined with clinical observations, patient history, and epidemiological information. The expected result is Negative.  Fact Sheet for Patients:  EntrepreneurPulse.com.au  Fact Sheet for Healthcare Providers:  IncredibleEmployment.be  This test is no t yet approved or cleared by the [place] FDA and  has been authorized for detection and/or diagnosis of SARS-CoV-2 by FDA under an Emergency Use Authorization (EUA). This EUA will remain  in effect (meaning this test can be used) for the duration of the COVID-19 declaration under Section 564(b)(1) of the Act, 21 U.S.C.section 360bbb-3(b)(1), unless the authorization is terminated  or revoked sooner.       Influenza A by PCR NEGATIVE NEGATIVE Final   Influenza B by PCR NEGATIVE NEGATIVE Final    Comment: (NOTE) The Xpert Xpress SARS-CoV-2/FLU/RSV plus assay is intended as an aid in the diagnosis of influenza from Nasopharyngeal swab specimens and should not be used as a sole basis for treatment. Nasal washings and aspirates are unacceptable for Xpert Xpress SARS-CoV-2/FLU/RSV testing.  Fact Sheet for Patients: EntrepreneurPulse.com.au  Fact Sheet for Healthcare Providers: IncredibleEmployment.be  This test is not yet approved or cleared by the [place] FDA and has been authorized for detection and/or diagnosis of SARS-CoV-2 by FDA under an Emergency Use Authorization (EUA). This EUA will remain in effect (meaning this test can be used) for the duration of the COVID-19 declaration under Section 564(b)(1) of the Act, 21 U.S.C. section 360bbb-3(b)(1), unless the authorization is terminated or revoked.  Performed at [facility], [address], [place], [place] [address]   Urine culture     Status: Abnormal (Preliminary result)   Collection Time: 11/26/20  4:41  PM   Specimen: Urine, Clean Catch  Result Value Ref Range Status   Specimen Description   Final    URINE, CLEAN CATCH Performed at [facility], [address] [place], [place] [address]    Special Requests   Final    NONE Performed at [facility], [address], [place], [place] [address]    Culture (A)  Final    >=100,000 COLONIES/mL ESCHERICHIA COLI SUSCEPTIBILITIES TO FOLLOW Performed at [facility], [place] [address] [place], [place] [address]    Report Status PENDING  Incomplete  Culture, blood (routine x 2)     Status: None (Preliminary result)   Collection Time: 11/26/20  6:32 PM   Specimen: Right Antecubital; Blood  Result Value Ref Range Status   Specimen Description RIGHT ANTECUBITAL  Final   Special Requests   Final    Blood Culture results may not be optimal due to an inadequate volume of blood received in culture bottles BOTTLES DRAWN AEROBIC AND ANAEROBIC   Culture   Final    NO GROWTH 2 DAYS Performed at [facility], [address], [place], [place] [address]    Report Status PENDING  Incomplete  Culture, blood (routine x 2)     Status: None (Preliminary result)   Collection Time: 11/26/20  6:42 PM   Specimen: Left Antecubital; Blood  Result Value Ref Range Status   Specimen Description LEFT ANTECUBITAL  Final   Special Requests   Final    Blood Culture results may not be optimal due to an inadequate volume of blood received in culture bottles BOTTLES DRAWN AEROBIC AND ANAEROBIC   Culture   Final    NO GROWTH 2 DAYS Performed at [facility], [address], [place], [place] [address]    Report Status PENDING  Incomplete      Radiology Studies: CT HEAD WO CONTRAST  Result Date: 11/26/2020 CLINICAL DATA:  Initial evaluation for acute confusion. EXAM: CT HEAD WITHOUT CONTRAST TECHNIQUE: Contiguous axial images were obtained from the base of the skull through the vertex without intravenous contrast. COMPARISON:  Prior MRI from 07/11/2020 FINDINGS:  Brain: Generalized age-related cerebral atrophy with moderate chronic small vessel ischemic disease. No acute intracranial hemorrhage. No acute large vessel territory infarct. No mass lesion, midline shift or mass effect. No hydrocephalus or extra-axial fluid collection. Vascular: No hyperdense vessel. Calcified atherosclerosis present at the skull base. Skull: Scalp soft tissues and calvarium within normal limits. Sinuses/Orbits: Globes and orbital soft tissues demonstrate no acute finding. Paranasal sinuses are clear. No mastoid effusion. Other: None. IMPRESSION: 1. No acute intracranial abnormality. 2. Generalized age-related cerebral atrophy with moderate chronic small vessel ischemic disease. Electronically Signed   By: Jeannine Boga M.D.   On: 11/26/2020 21:15   EEG adult  Result Date: 11/27/2020 Lora Havens, MD     11/27/2020  6:18 PM Patient Name: Amy [name] MRN: [id number] Epilepsy Attending: Lora Havens Referring Physician/Provider: Dr Bonnielee Haff Date: 11/27/2020 Duration: 23.57 mins Patient history: 85yo F with ams. EEG to evaluate for seizure. Level of alertness: Awake AEDs during EEG study: Xanax Technical aspects: This EEG study was done with scalp electrodes positioned according to the 10-20 International system of electrode placement. Electrical activity was acquired at a sampling rate of 500Hz  and reviewed with a high frequency filter of 70Hz  and a low frequency filter of 1Hz . EEG data were recorded continuously and digitally stored. Description: The posterior dominant rhythm consists of 8 Hz activity of moderate voltage (25-35 uV) seen predominantly in posterior head regions, symmetric and reactive to eye opening and eye closing. EEG showed intermittent generalized 3 to 6 Hz theta-delta slowing. Hyperventilation and photic stimulation were not performed.   ABNORMALITY - Intermittent slow, generalized IMPRESSION: This study is suggestive of mild diffuse encephalopathy,  nonspecific etiology.  No seizures or epileptiform discharges were seen throughout the recording. [place]       LOS: 2 days   Hezakiah Champeau [employer] Pager on [email or web address]  11/28/2020, 11:50 AM

## 2020-11-28 NOTE — Progress Notes (Signed)
Physical Therapy Note  Patient Details  Name: Amy [name] MRN: [id number] Date of Birth: [birth year] Today's Date: 11/28/2020    Pt refused therapy today X 2 attempts.  No reason given  Teena Irani, PTA/CLT [phone]    Roseanne Reno B  11/28/2020, 3:41PM

## 2020-11-28 NOTE — Progress Notes (Signed)
Palliative: Amy [name] is sitting up in bed with her lunch tray in front of her.  She appears acutely/chronically ill and quite frail.  She will make an somewhat keep eye contact.  She is able to tell me her name only, with known memory loss.  She appears fearful and mistrustful.  She voices no concerns or questions at this time.  Dietary aide and I help her get a meal set up.  She is able to feed herself and is eating as I leave.  Conference with attending, bedside nursing staff, transition of care team related to patient condition, needs, goals of care, disposition.  Plan: Continue to treat the treatable but no CPR or intubation.  Short-term rehab.  Anticipate need for ALF versus LTC.  Outpatient palliative services to follow.  25 minutes Quinn Axe, NP Palliative medicine team Team phone [phone] Greater than 50% of this time was spent counseling and coordinating care related to the above assessment and plan.

## 2020-11-29 DIAGNOSIS — Z8673 Personal history of transient ischemic attack (TIA), and cerebral infarction without residual deficits: Secondary | ICD-10-CM

## 2020-11-29 DIAGNOSIS — B9620 Unspecified Escherichia coli [E. coli] as the cause of diseases classified elsewhere: Secondary | ICD-10-CM

## 2020-11-29 DIAGNOSIS — I639 Cerebral infarction, unspecified: Secondary | ICD-10-CM

## 2020-11-29 LAB — BASIC METABOLIC PANEL
Anion gap: 9 (ref 5–15)
BUN: 18 mg/dL (ref 8–23)
CO2: 26 mmol/L (ref 22–32)
Calcium: 8.6 mg/dL — ABNORMAL LOW (ref 8.9–10.3)
Chloride: 104 mmol/L (ref 98–111)
Creatinine, Ser: 0.78 mg/dL (ref 0.44–1.00)
GFR, Estimated: >60 mL/min (ref >60)
Glucose, Bld: 151 mg/dL — ABNORMAL HIGH (ref 70–99)
Potassium: 3.4 mmol/L — ABNORMAL LOW (ref 3.5–5.1)
Sodium: 139 mmol/L (ref 135–145)

## 2020-11-29 LAB — GLUCOSE, CAPILLARY
Glucose-Capillary: 158 mg/dL — ABNORMAL HIGH (ref 70–99)
Glucose-Capillary: 184 mg/dL — ABNORMAL HIGH (ref 70–99)
Glucose-Capillary: 229 mg/dL — ABNORMAL HIGH (ref 70–99)
Glucose-Capillary: 147 mg/dL — ABNORMAL HIGH (ref 70–99)

## 2020-11-29 LAB — CBC WITH DIFFERENTIAL/PLATELET
Abs Immature Granulocytes: 0.01 10*3/uL (ref 0.00–0.07)
Basophils Absolute: 0 10*3/uL (ref 0.0–0.1)
Basophils Relative: 0 %
Eosinophils Absolute: 0.2 10*3/uL (ref 0.0–0.5)
Eosinophils Relative: 2 %
HCT: 41.5 % (ref 36.0–46.0)
Hemoglobin: 13.5 g/dL (ref 12.0–15.0)
Immature Granulocytes: 0 %
Lymphocytes Relative: 22 %
Lymphs Abs: 1.6 10*3/uL (ref 0.7–4.0)
MCH: 29.4 pg (ref 26.0–34.0)
MCHC: 32.5 g/dL (ref 30.0–36.0)
MCV: 90.4 fL (ref 80.0–100.0)
Monocytes Absolute: 0.7 10*3/uL (ref 0.1–1.0)
Monocytes Relative: 9 %
Neutro Abs: 4.8 10*3/uL (ref 1.7–7.7)
Neutrophils Relative %: 67 %
Platelets: 229 10*3/uL (ref 150–400)
RBC: 4.59 MIL/uL (ref 3.87–5.11)
RDW: 12.8 % (ref 11.5–15.5)
WBC: 7.3 10*3/uL (ref 4.0–10.5)
nRBC: 0 % (ref 0.0–0.2)

## 2020-11-29 LAB — URINE CULTURE: Culture: >=100000 — AB

## 2020-11-29 LAB — MAGNESIUM: Magnesium: 1.8 mg/dL (ref 1.7–2.4)

## 2020-11-29 MED ORDER — POLYVINYL ALCOHOL 1.4 % OP SOLN
1.0000 [drp] | OPHTHALMIC | Status: DC | PRN
  Administered 2020-11-29: 1 [drp] via OPHTHALMIC
  Filled 2020-11-29: qty 15

## 2020-11-29 MED ORDER — CEPHALEXIN 500 MG PO CAPS
500.0000 mg | ORAL_CAPSULE | Freq: Three times a day (TID) | ORAL | Status: DC
  Administered 2020-11-29 – 2020-12-01 (×6): 500 mg via ORAL
  Filled 2020-11-29 (×6): qty 1

## 2020-11-29 MED ORDER — MAGNESIUM SULFATE 2 GM/50ML IV SOLN
2.0000 g | Freq: Once | INTRAVENOUS | Status: AC
  Administered 2020-11-29: 2 g via INTRAVENOUS
  Filled 2020-11-29: qty 50

## 2020-11-29 MED ORDER — POTASSIUM CHLORIDE CRYS ER 20 MEQ PO TBCR
40.0000 meq | EXTENDED_RELEASE_TABLET | Freq: Two times a day (BID) | ORAL | Status: AC
  Administered 2020-11-29 (×2): 40 meq via ORAL
  Filled 2020-11-29 (×2): qty 2

## 2020-11-29 NOTE — Progress Notes (Addendum)
Patient's BP is 114/46. MD [facility] notified. Will continue to monitor

## 2020-11-29 NOTE — TOC Progression Note (Signed)
CSW received call from [place] at [facility] who states Amy [name] has denied auth for pt to go to SNF. CSW reached out to Attending to inform of Peer to Peer information. The number to complete the peer to peer is [phone], option 3. It will expire at 12 noon tomorrow. The reference number is [id number]. TOC to follow.

## 2020-11-29 NOTE — Progress Notes (Addendum)
Palliative: Mrs. Simons is sitting in the [place] chair in her room.  She greets me making and somewhat keeping eye contact.  She has her pure wick catheter in her hand.  She has known dementia, but is able to tell me her name, and that we are in the hospital.  I believe that she can make her basic needs known.  There is no family at bedside at this time.  We talked about short-term rehab for strength and balance.  Mrs. Cavan tells me that she has got her mobility.  We continue to talk about how to care for her, and the more we speak, the more mistrustful she appears.  Bedside nursing staff is present.  At this point I do not believe that Mrs. Bevacqua is able to make her own choices.  Call to son, healthcare surrogate, Patrici Ranks.  He shares that he understands that Mrs. Mowbray is a little better today, eating better and able to feed herself.  I share our meeting from this morning, that she indeed looks improved, but continues to seem fearful.  We talked about UTI and the treatment plan, discharged to SNF for rehab and "routine".  Roderic Palau asks if there has been any testing or issues with her "slow growing lymphoma".  He shares that family is to follow-up with Dr. Raliegh Ip in July. There are considering how to care for her, and Roderic Palau shares that Mrs. Deupree had forgotten that she had lymphoma during their last discussion about it.  It seems that Roderic Palau sees her clearly, and desires to care for her in a respectful and loving manner.  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   Conference with attending, bedside nursing staff, transition of care team related to patient condition, needs, goals of care.  Plan:   At this point, I do not believe that Mrs. Sitton is able to make her own choices.  Continue to treat the treatable but no CPR or intubation.  At this point goal is short-term rehab, has been accepted to [facility].  Anticipate need for ALF versus  LTC  35 minutes  Quinn Axe, NP Palliative medicine team Team phone [phone] Greater than 50% of this time was spent counseling and coordinating care related to the above assessment and plan.

## 2020-11-29 NOTE — Progress Notes (Signed)
Patient is sitting up in recliner and has ate almost all of her lunch and has fed herself today.

## 2020-11-29 NOTE — Progress Notes (Signed)
PROGRESS NOTE    Amy [name]  MWU:[id number] DOB: September 30, 1935 DOA: 11/26/2020 PCP: [name], MD   Brief Narrative:  Patient is an 85 year old Caucasian female with a past medical history significant for but not limited to dementia, history of CVA, history of systolic CHF, diabetes mellitus type 2 as well as other comorbidities who brought to the ED via EMS.  History is primarily obtained from patient's son who is the healthcare power of attorney.  Apparently the patient been wandering outside of her house symptoms progressively getting worse with her dementia and he has not been able to care for herself at home.  She currently lives by herself and there is also concerns medical reasons for her acute resume her medications and she was worked up and found to have a UTI.  She is being treated for a UTI but mentation remains the same.  PT OT initially recommended SNF but she has been unfortunately denied by her insurance and currently awaiting peer-to-peer.  We will need to look into other disposition planning and if she is not accepted to SNF.  Palliative care was consulted for further goals of care discussion spoke with patient's son today.  Assessment & Plan:   Principal Problem:   Acute metabolic encephalopathy Active Problems:   Type 2 diabetes mellitus with neurological complications (HCC)   History of CVA (cerebrovascular accident)   Chronic systolic CHF (congestive heart failure) (HCC)   UTI (urinary tract infection)   Cryptogenic stroke (HCC)  Progressive dementia versus acute metabolic encephalopathy; Likley progressive Dementia -CT head did not show any acute findings.  Age-related atrophy was noted.   -UA was noted to be abnormal and the patient was started on antibiotics as below -Patient's metabolic work-up has been unremarkable so far including normal folic acid level and normal thyroid function test.  -Patient's vitamin B12 level was 1435 in December 2021.   -RPR was  negative last year.  EEG was also done which did not show any epileptiform activity. -Patient was quite agitated the day before yesterday and was trying to get out of the bed.   -She was given Haldol and Seroquel with improvement.  Home medication list reviewed.  Will add back the Risperdal that she was taking at nighttime.   -Hoping that patient mentation improves as her UTI is treated but still pleasantly demented  -PT/OT initially recommended SNF but she was denied by [employer]; Awaiting call back from Peer to Peer  Urinary tract infection with E. coli -UA was noted to be abnormal.   -She was started on ceftriaxone.   -Urine culture growing E. Coli with Resistance only to Ampicillin and Ampicillin/Sulbactam.   -Continued IV Ceftriaxone and changed to po Keflex 500 mg TID.   -PT/OT consulted   Chronic Systolic CHF -Echocardiogram from March 2021 showed EF to be 35 to 40% with grade 1 diastolic dysfunction.    -Continue metoprolol Succinate.   -Also noted to be on aspirin.  Seems to be euvolemic. -Continue to Monitor for S/Sx of Volume Overload  Diabetes Mellitus Type 2 -CBGs are reasonably well controlled -Patient's hemoglobin A1c was 6.2 -Continue with sliding scale -CBG's ranging from 124-184  Essential Hypertension -C/w Metoprolol Succinate 25 mg po Daily  -Continue to Monitor BP per Protocol -Last BP was   Chronic Dementia with Behavioral Disturbances  -Appears baseline -C/w Quetiapine 25 mg po BID and Risperidone 1 mg po qHS -C/w Haloperidol Lactate 1 mg IV q6hprn Agitation  -EEG done and showed "This  study is suggestive of mild diffuse encephalopathy, nonspecific etiology. No seizures or epileptiform discharges were seen throughout the recording." -C/w Donepezil 5 mg po Daily and Escitalopram 20 mg po Daily and Alprazolam 0.5 mg po qHS -C/w Delirium Precautions and Re-Orientation  Hypokalemia -Patient's K+ was 3.4 -Replete with Potassium Chloride 40 mEQ BID  x2 -Continue to Monitor and check Mag Level in the AM and replete as Necessary -Repeat CMP within 1 week   History of stage Ia breast cancer involving the right breast -She is status postlumpectomy in 2018  -currently on Letrozole 5 mg -Follow-up with oncology at the [facility]  History of cryptogenic stroke -On aspirin 81 mg p.o. daily -Currently not noted to be on a statin  Goals of Care:  -Currently she is DO NOT RESUSCITATE -Appreciate palliative care assistance and evaluation -Plan Was for the patient to go to skilled nursing rehabilitation and close follow-up with palliative care as an outpatient but Insurance denied her Authorization -Called for Peer to Peer at 15:40 and awaiting call back reference #[id number]   DVT prophylaxis: Enoxaparin 40 mg sq q24h Code Status: DO NOT RESUSCITATE  Family Communication: No family present at bedside Disposition Plan: Was to go to SNF but now Denied; Awaiting Peer to Peer call back; may need Private Pay for [place] vs. Home with Home Health   Status is: Inpatient  Remains inpatient appropriate because:Unsafe d/c plan, IV treatments appropriate due to intensity of illness or inability to take PO and Inpatient level of care appropriate due to severity of illness   Dispo: The patient is from: Home              Anticipated d/c is to: TBD; Originally was to SNF              Patient currently is medically stable to d/c.   Difficult to place patient No  Consultants:   None   Procedures:  EEG Description: The posterior dominant rhythm consists of 8 Hz activity of moderate voltage (25-35 uV) seen predominantly in posterior head regions, symmetric and reactive to eye opening and eye closing. EEG showed intermittent generalized 3 to 6 Hz theta-delta slowing. Hyperventilation and photic stimulation were not performed.     ABNORMALITY - Intermittent slow, generalized  IMPRESSION: This study is suggestive of mild  diffuse encephalopathy, nonspecific etiology. No seizures or epileptiform discharges were seen throughout the recording.   Antimicrobials: Anti-infectives (From admission, onward)   Start     Dose/Rate Route Frequency Ordered Stop   11/29/20 1600  cephALEXin (KEFLEX) capsule 500 mg        500 mg Oral 3 times daily 11/29/20 1541     11/27/20 1800  cefTRIAXone (ROCEPHIN) 1 g in sodium chloride 0.9 % 100 mL IVPB  Status:  Discontinued        1 g 200 mL/hr over 30 Minutes Intravenous Every 24 hours 11/26/20 2138 11/29/20 1540   11/26/20 1800  cefTRIAXone (ROCEPHIN) 1 g in sodium chloride 0.9 % 100 mL IVPB        1 g 200 mL/hr over 30 Minutes Intravenous  Once 11/26/20 1755 11/26/20 1946        Subjective: Seen and examined at bedside and she is currently confused and want to get up out of the bed.  No nausea or vomiting.  Wants to sit in the chair.  Denies any lightheadedness or dizziness.  No other concerns present this time is still very pleasantly confused  and somewhat agitated.  Objective: Vitals:   11/28/20 2055 11/29/20 0430 11/29/20 0828 11/29/20 1301  BP: (!) 158/86 112/74 130/80 (!) 114/46  Pulse: 84 84 93 76  Resp: 17 19  20   Temp: 98.1 F (36.7 C) 98.7 F (37.1 C)  98 F (36.7 C)  TempSrc: Oral     SpO2: 98% 97%  94%  Weight:      Height:        Intake/Output Summary (Last 24 hours) at 11/29/2020 1548 Last data filed at 11/29/2020 1500 Gross per 24 hour  Intake 1137.64 ml  Output 400 ml  Net 737.64 ml   Filed Weights   11/26/20 1138  Weight: 73 kg   Examination: Physical Exam:  Constitutional: Thin chronically ill-appearing Caucasian female who is pleasantly demented and NAD and appears mildly agitated Eyes: Lids and conjunctivae normal, sclerae anicteric  ENMT: External Ears, Nose appear normal. Grossly normal hearing.  Neck: Appears normal, supple, no cervical masses, normal ROM, no appreciable thyromegaly; no JVD Respiratory: Diminished to auscultation  bilaterally with coarse breath sounds, no wheezing, rales, rhonchi or crackles. Normal respiratory effort and patient is not tachypenic. No accessory muscle use.  Unlabored breathing Cardiovascular: RRR, no murmurs / rubs / gallops.  No appreciable extremity edema. Abdomen: Soft, non-tender, non-distended. Bowel sounds positive.  GU: Deferred. Musculoskeletal: No clubbing / cyanosis of digits/nails. No joint deformity upper and lower extremities.  Skin: No rashes, lesions, ulcers on limited skin evaluation. No induration; Warm and dry.  Neurologic: CN 2-12 grossly intact with no focal deficits. Romberg sign and cerebellar reflexes not assessed.  Psychiatric: Impaired judgment and insight.  Pleasantly demented and awake and alert and oriented x1. Normal mood and appropriate affect.   Data Reviewed: I have personally reviewed following labs and imaging studies  CBC: Recent Labs  Lab 11/26/20 1214 11/28/20 0404 11/29/20 0344  WBC 5.8 8.0 7.3  NEUTROABS 4.2  --  4.8  HGB 13.4 13.4 13.5  HCT 41.7 41.2 41.5  MCV 91.2 89.2 90.4  PLT 220 230 [id number]   Basic Metabolic Panel: Recent Labs  Lab 11/26/20 1214 11/28/20 0404 11/29/20 0344  NA 139 138 139  K 4.5 3.4* 3.4*  CL 102 101 104  CO2 29 27 26   GLUCOSE 127* 127* 151*  BUN 21 13 18   CREATININE 1.02* 0.73 0.78  CALCIUM 9.8 8.9 8.6*  MG  --   --  1.8   GFR: Estimated Creatinine Clearance: 53.1 mL/min (by C-G formula based on SCr of 0.78 mg/dL). Liver Function Tests: Recent Labs  Lab 11/26/20 1214 11/28/20 0404  AST 20 19  ALT 13 12  ALKPHOS 43 38  BILITOT 0.5 1.0  PROT 6.5 6.6  ALBUMIN 3.7 4.0   No results for input(s): LIPASE, AMYLASE in the last 168 hours. No results for input(s): AMMONIA in the last 168 hours. Coagulation Profile: No results for input(s): INR, PROTIME in the last 168 hours. Cardiac Enzymes: No results for input(s): CKTOTAL, CKMB, CKMBINDEX, TROPONINI in the last 168 hours. BNP (last 3 results) Recent  Labs    08/21/20 0958  PROBNP 120   HbA1C: Recent Labs    11/28/20 0404  HGBA1C 6.2*   CBG: Recent Labs  Lab 11/28/20 1103 11/28/20 1605 11/28/20 2157 11/29/20 0810 11/29/20 1107  GLUCAP 124* 125* 135* 158* 184*   Lipid Profile: No results for input(s): CHOL, HDL, LDLCALC, TRIG, CHOLHDL, LDLDIRECT in the last 72 hours. Thyroid Function Tests: Recent Labs    11/28/20 0404  TSH 2.074  FREET4 1.08   Anemia Panel: Recent Labs    11/28/20 0404  FOLATE 12.5   Sepsis Labs: No results for input(s): PROCALCITON, LATICACIDVEN in the last 168 hours.  Recent Results (from the past 240 hour(s))  Resp Panel by RT-PCR (Flu A&B, Covid) Nasopharyngeal Swab     Status: None   Collection Time: 11/26/20 12:49 PM   Specimen: Nasopharyngeal Swab; Nasopharyngeal(NP) swabs in vial transport medium  Result Value Ref Range Status   SARS Coronavirus 2 by RT PCR NEGATIVE NEGATIVE Final    Comment: (NOTE) SARS-CoV-2 target nucleic acids are NOT DETECTED.  The SARS-CoV-2 RNA is generally detectable in upper respiratory specimens during the acute phase of infection. The lowest concentration of SARS-CoV-2 viral copies this assay can detect is 138 copies/mL. A negative result does not preclude SARS-Cov-2 infection and should not be used as the sole basis for treatment or other patient management decisions. A negative result may occur with  improper specimen collection/handling, submission of specimen other than nasopharyngeal swab, presence of viral mutation(s) within the areas targeted by this assay, and inadequate number of viral copies(<138 copies/mL). A negative result must be combined with clinical observations, patient history, and epidemiological information. The expected result is Negative.  Fact Sheet for Patients:  EntrepreneurPulse.com.au  Fact Sheet for Healthcare Providers:  IncredibleEmployment.be  This test is no t yet approved or  cleared by the [place] FDA and  has been authorized for detection and/or diagnosis of SARS-CoV-2 by FDA under an Emergency Use Authorization (EUA). This EUA will remain  in effect (meaning this test can be used) for the duration of the COVID-19 declaration under Section 564(b)(1) of the Act, 21 U.S.C.section 360bbb-3(b)(1), unless the authorization is terminated  or revoked sooner.       Influenza A by PCR NEGATIVE NEGATIVE Final   Influenza B by PCR NEGATIVE NEGATIVE Final    Comment: (NOTE) The Xpert Xpress SARS-CoV-2/FLU/RSV plus assay is intended as an aid in the diagnosis of influenza from Nasopharyngeal swab specimens and should not be used as a sole basis for treatment. Nasal washings and aspirates are unacceptable for Xpert Xpress SARS-CoV-2/FLU/RSV testing.  Fact Sheet for Patients: EntrepreneurPulse.com.au  Fact Sheet for Healthcare Providers: IncredibleEmployment.be  This test is not yet approved or cleared by the [place] FDA and has been authorized for detection and/or diagnosis of SARS-CoV-2 by FDA under an Emergency Use Authorization (EUA). This EUA will remain in effect (meaning this test can be used) for the duration of the COVID-19 declaration under Section 564(b)(1) of the Act, 21 U.S.C. section 360bbb-3(b)(1), unless the authorization is terminated or revoked.  Performed at [facility], [address], [place], [place] [address]   Urine culture     Status: Abnormal   Collection Time: 11/26/20  4:41 PM   Specimen: Urine, Clean Catch  Result Value Ref Range Status   Specimen Description   Final    URINE, CLEAN CATCH Performed at [facility], [address], [place], [place] [address]    Special Requests   Final    NONE Performed at [facility], [address] [place], [place] [address]    Culture >=100,000 COLONIES/mL ESCHERICHIA COLI (A)  Final   Report Status 11/29/2020 FINAL  Final   Organism ID,  Bacteria ESCHERICHIA COLI (A)  Final      Susceptibility   Escherichia coli - MIC*    AMPICILLIN >=32 RESISTANT Resistant     CEFAZOLIN <=4 SENSITIVE Sensitive  CEFEPIME <=0.12 SENSITIVE Sensitive     CEFTRIAXONE <=0.25 SENSITIVE Sensitive     CIPROFLOXACIN <=0.25 SENSITIVE Sensitive     GENTAMICIN <=1 SENSITIVE Sensitive     IMIPENEM <=0.25 SENSITIVE Sensitive     NITROFURANTOIN <=16 SENSITIVE Sensitive     TRIMETH/SULFA <=20 SENSITIVE Sensitive     AMPICILLIN/SULBACTAM >=32 RESISTANT Resistant     PIP/TAZO <=4 SENSITIVE Sensitive     * >=100,000 COLONIES/mL ESCHERICHIA COLI  Culture, blood (routine x 2)     Status: None (Preliminary result)   Collection Time: 11/26/20  6:32 PM   Specimen: Right Antecubital; Blood  Result Value Ref Range Status   Specimen Description RIGHT ANTECUBITAL  Final   Special Requests   Final    Blood Culture results may not be optimal due to an inadequate volume of blood received in culture bottles BOTTLES DRAWN AEROBIC AND ANAEROBIC   Culture   Final    NO GROWTH 3 DAYS Performed at [facility], [address], [place], [place] [address]    Report Status PENDING  Incomplete  Culture, blood (routine x 2)     Status: None (Preliminary result)   Collection Time: 11/26/20  6:42 PM   Specimen: Left Antecubital; Blood  Result Value Ref Range Status   Specimen Description LEFT ANTECUBITAL  Final   Special Requests   Final    Blood Culture results may not be optimal due to an inadequate volume of blood received in culture bottles BOTTLES DRAWN AEROBIC AND ANAEROBIC   Culture   Final    NO GROWTH 3 DAYS Performed at [facility], [address], [place], [place] [address]    Report Status PENDING  Incomplete    RN Pressure Injury Documentation:     Estimated body mass index is 27.62 kg/m as calculated from the following:   Height as of this encounter: 5\' 4"  (1.626 m).   Weight as of this encounter: 73 kg.  Malnutrition Type:  Nutrition Problem:  Inadequate oral intake Etiology: acute illness (acute metabolic encephalopathy)  Malnutrition Characteristics:  Signs/Symptoms: meal completion < 25%  Nutrition Interventions:  Interventions: Ensure Enlive (each supplement provides 350kcal and 20 grams of protein)   Radiology Studies: EEG adult  Result Date: 12/16/2020 Lora Havens, MD     12/16/2020  6:18 PM Patient Name: Amy [name] MRN: [id number] Epilepsy Attending: Lora Havens Referring Physician/Provider: Dr Bonnielee Haff Date: 16-Dec-2020 Duration: 23.57 mins Patient history: 85yo F with ams. EEG to evaluate for seizure. Level of alertness: Awake AEDs during EEG study: Xanax Technical aspects: This EEG study was done with scalp electrodes positioned according to the 10-20 International system of electrode placement. Electrical activity was acquired at a sampling rate of 500Hz  and reviewed with a high frequency filter of 70Hz  and a low frequency filter of 1Hz . EEG data were recorded continuously and digitally stored. Description: The posterior dominant rhythm consists of 8 Hz activity of moderate voltage (25-35 uV) seen predominantly in posterior head regions, symmetric and reactive to eye opening and eye closing. EEG showed intermittent generalized 3 to 6 Hz theta-delta slowing. Hyperventilation and photic stimulation were not performed.   ABNORMALITY - Intermittent slow, generalized IMPRESSION: This study is suggestive of mild diffuse encephalopathy, nonspecific etiology. No seizures or epileptiform discharges were seen throughout the recording. Priyanka Barbra Sarks   Scheduled Meds: . ALPRAZolam  0.5 mg Oral QHS  . aspirin EC  81 mg Oral q morning  . cephALEXin  500 mg Oral TID  .  donepezil  5 mg Oral Daily  . enoxaparin (LOVENOX) injection  40 mg Subcutaneous Q24H  . escitalopram  20 mg Oral Daily  . feeding supplement  237 mL Oral BID BM  . insulin aspart  0-5 Units Subcutaneous QHS  . insulin aspart  0-9 Units Subcutaneous  TID WC  . metoprolol succinate  25 mg Oral Daily  . potassium chloride  40 mEq Oral BID  . QUEtiapine  25 mg Oral BID  . risperiDONE  1 mg Oral QHS   Continuous Infusions:   LOS: 3 days   Kerney Elbe, DO [facility] PAGER is on [place]  If 7PM-7AM, please contact night-coverage [email or web address]

## 2020-11-29 NOTE — Discharge Summary (Signed)
Physician Discharge Summary  Amy [name] [id number] DOB: 1935-10-12 DOA: 11/26/2020  PCP: [name], MD  Admit date: 11/26/2020 Discharge date: 11/30/2020  Admitted From: Home Disposition: ALF Memory Care Unit   Recommendations for Outpatient Follow-up:  1. Follow up with PCP in 1-2 weeks 2. Have Palliative Care follow up at ALF 3. Please obtain CMP/CBC , Mag, Phos in one week 4. Please follow up on the following pending results:  Home Health: No Equipment/Devices: None  Discharge Condition: Stable CODE STATUS: DO NOT RESUSCITATE Diet recommendation: Regular Diet   Brief/Interim Summary: The Patient is an 85 year old Caucasian female with a past medical history significant for but not limited to dementia, history of CVA, history of systolic CHF, diabetes mellitus type 2 as well as other comorbidities who brought to the ED via EMS.  History is primarily obtained from patient's son who is the healthcare power of attorney.  Apparently the patient been wandering outside of her house symptoms progressively getting worse with her dementia and he has not been able to care for herself at home.  She currently lives by herself and there is also concerns medical reasons for her acute resume her medications and she was worked up and found to have a UTI.  She is being treated for a UTI but mentation remains the same.  PT OT initially recommended SNF but she has been unfortunately denied by her insurance and did not get a call back for the peer to peer so now she will have regular ALF memory care unit.  She is medically stable to be discharged and did have a slight AKI but upon rehydration and reevaluation AKI is improved and resolved.  Discharge Diagnoses:  Principal Problem:   Acute metabolic encephalopathy Active Problems:   Type 2 diabetes mellitus with neurological complications (HCC)   History of CVA (cerebrovascular accident)   Chronic systolic CHF (congestive heart failure) (HCC)   UTI  (urinary tract infection)   Cryptogenic stroke (HCC)   Progressive dementia versus acute metabolic encephalopathy; Likley progressive Dementia -CT head did not show any acute findings. Age-related atrophy was noted.  -UA was noted to be abnormal and the patient was started on antibiotics as below -Patient's metabolic work-up has been unremarkable so far including normal folic acid level and normal thyroid function test. -Patient's vitamin B12 level was 1435 in December 2021.  -RPR was negative last year. EEG was also done which did not show any epileptiform activity. -Patient was quite agitated the day before yesterday and was trying to get out of the bed.  -She was given Haldol and Seroquel with improvement. Home medication list reviewed. Will add back the Risperdal that she was taking at nighttime.  -Hoping that patient mentation improves as her UTI is treated but still pleasantly demented  -PT/OT initially recommended SNF but she was denied by [employer]; Awaiting call back from Peer to Peer  Urinary tract infectionwith E. coli -UA was noted to be abnormal.  -She was started on ceftriaxone.  -Urine culture growing E. Coli with Resistance only to Ampicillin and Ampicillin/Sulbactam.  -Continued IV Ceftriaxone and changed to po Keflex 500 mg TID.  -PT/OT consulted   Chronic Systolic CHF -Echocardiogram from March 2021 showed EF to be 35 to 40% with grade 1 diastolic dysfunction. -Continue metoprolol Succinate.  -Also noted to be on aspirin. Seems to be euvolemic. -Continue to Monitor for S/Sx of Volume Overload now that she is being given IV fluid hydration  AKI -Patient's BUN/Cr went from  13/0.73 -> 18/0.78 -> 24/1.03 -> 20/0.87 -Given a NS bolus this AM and started on gentle Maintenance IVF -Avoid further nephrotoxic medications, contrast dyes, hypotension renally dose medications -Encouraged p.o. intake -Repeat CMP at the facility  Diabetes Mellitus Type  2 -CBGs are reasonably well controlled -Patient's hemoglobin A1c was 6.2 -Continue with sliding scale -CBG's ranging from 124-184  Essential Hypertension -C/w Metoprolol Succinate 25 mg po Daily  -Continue to Monitor BP per Protocol -Last BP was 153/76  Chronic Dementia with Behavioral Disturbances  -Appears baseline -C/w Quetiapine 25 mg po BID and Risperidone 1 mg po qHS -C/w Haloperidol Lactate 1 mg IV q6hprn Agitation  -EEG done and showed "This study is suggestive of mild diffuse encephalopathy, nonspecific etiology.No seizures or epileptiform discharges were seen throughout the recording." -C/w Donepezil 5 mg po Daily and Escitalopram 20 mg po Daily and Alprazolam 0.5 mg po qHS -C/w Delirium Precautions and Re-Orientation  Hypokalemia -Patient's K+ was 3.4 -Replete with Potassium Chloride 40 mEQ BID x2 -Continue to Monitor and check Mag Level in the AM and replete as Necessary -Repeat CMP within 1 week   History of stage Ia breast cancer involving the right breast -She is status postlumpectomy in 2018  -currently on Letrozole 5 mg -Follow-up [facility] oncology [facility]  History of cryptogenic stroke -On aspirin 81 mg p.o. daily -Currently not noted to be on a statin  Goals of Care:  -Currently she is DO NOT RESUSCITATE -Appreciate palliative care assistance and evaluation -Plan Was for the patient to go to skilled nursing rehabilitation and close follow-up with palliative care as an outpatient but Insurance denied her Authorization -Called for Peer to Peer at 15:40  Yesterday and awaiting call back reference [id number]. Likely she will be denied and now will go to ALF   Discharge Instructions   Allergies as of 11/30/2020      Reactions   Cymbalta [duloxetine Hcl] Other (See Comments)   Hallucinated and couldn't think; got worse and worse the longer she took this.   Duloxetine       Medication List    STOP taking these medications    Acetaminophen-Codeine 300-15 MG Tabs   furosemide 20 MG tablet Commonly known as: LASIX   venlafaxine XR 150 MG 24 hr capsule Commonly known as: EFFEXOR-XR     TAKE these medications   acetaminophen 325 MG tablet Commonly known as: TYLENOL Take 2 tablets (650 mg total) by mouth every 6 (six) hours as needed for mild pain (or Fever >/= 101).   ALPRAZolam 0.5 MG tablet Commonly known as: XANAX Take 1 tablet (0.5 mg total) by mouth at bedtime.   aspirin EC 81 MG tablet Take 81 mg by mouth every morning.   calcium carbonate 1500 (600 Ca) MG Tabs tablet Commonly known as: OSCAL Take 600 mg of elemental calcium by mouth daily with supper.   cephALEXin 500 MG capsule Commonly known as: KEFLEX Take 1 capsule (500 mg total) by mouth 3 (three) times daily for 3 days.   cholecalciferol 1000 units tablet Commonly known as: VITAMIN D Take 1 tablet (1,000 Units total) by mouth daily.   donepezil 5 MG tablet Commonly known as: ARICEPT Take 1 tablet (5 mg total) by mouth daily.   escitalopram 20 MG tablet Commonly known as: LEXAPRO TAKE 1 TABLET(20 MG) BY MOUTH DAILY What changed: See the new instructions.   feeding supplement Liqd Take 237 mLs by mouth 2 (two) times daily between meals.  gabapentin 100 MG capsule Commonly known as: NEURONTIN Take 200 mg by mouth at bedtime.   letrozole 2.5 MG tablet Commonly known as: FEMARA TAKE 1 TABLET(2.5 MG) BY MOUTH DAILY   metFORMIN 500 MG tablet Commonly known as: GLUCOPHAGE Take 1 tablet (500 mg total) by mouth 2 (two) times daily with a meal.   metoprolol succinate 25 MG 24 hr tablet Commonly known as: TOPROL-XL Take 1 tablet (25 mg total) by mouth daily. Please keep upcoming appt in January 2022 before anymore refills. Thank you   multivitamin with minerals Tabs tablet Take 1 tablet by mouth daily. Start taking on: Dec 01, 2020   ondansetron 4 MG tablet Commonly known as: ZOFRAN Take 1 tablet (4 mg total) by mouth  every 6 (six) hours as needed for nausea.   oxybutynin 5 MG tablet Commonly known as: DITROPAN Take 5 mg by mouth at bedtime.   polyethylene glycol 17 g packet Commonly known as: MIRALAX / GLYCOLAX Take 17 g by mouth daily as needed for mild constipation.   polyvinyl alcohol 1.4 % ophthalmic solution Commonly known as: LIQUIFILM TEARS Place 1 drop into both eyes as needed for dry eyes.   QUEtiapine 25 MG tablet Commonly known as: SEROQUEL Take 1 tablet (25 mg total) by mouth 2 (two) times daily. What changed: when to take this   risperiDONE 1 MG tablet Commonly known as: RISPERDAL Take 1 mg by mouth at bedtime. What changed: Another medication with the same name was removed. Continue taking this medication, and follow the directions you see here.       Allergies  Allergen Reactions  . Cymbalta [Duloxetine Hcl] Other (See Comments)    Hallucinated and couldn't think; got worse and worse the longer she took this.  . Duloxetine     Consultations:  None  Procedures/Studies: CT HEAD WO CONTRAST  Result Date: 11/26/2020 CLINICAL DATA:  Initial evaluation for acute confusion. EXAM: CT HEAD WITHOUT CONTRAST TECHNIQUE: Contiguous axial images were obtained from the base of the skull through the vertex without intravenous contrast. COMPARISON:  Prior MRI from 07/11/2020 FINDINGS: Brain: Generalized age-related cerebral atrophy with moderate chronic small vessel ischemic disease. No acute intracranial hemorrhage. No acute large vessel territory infarct. No mass lesion, midline shift or mass effect. No hydrocephalus or extra-axial fluid collection. Vascular: No hyperdense vessel. Calcified atherosclerosis present at the skull base. Skull: Scalp soft tissues and calvarium within normal limits. Sinuses/Orbits: Globes and orbital soft tissues demonstrate no acute finding. Paranasal sinuses are clear. No mastoid effusion. Other: None. IMPRESSION: 1. No acute intracranial abnormality. 2.  Generalized age-related cerebral atrophy with moderate chronic small vessel ischemic disease. Electronically Signed   By: Jeannine Boga M.D.   On: 11/26/2020 21:15   EEG adult  Result Date: 11/27/2020 Lora Havens, MD     11/27/2020  6:18 PM Patient Name: Amy [name] MRN: [id number] Epilepsy Attending: Lora Havens Referring Physician/Provider: Dr Bonnielee Haff Date: 11/27/2020 Duration: 23.57 mins Patient history: 85yo F with ams. EEG to evaluate for seizure. Level of alertness: Awake AEDs during EEG study: Xanax Technical aspects: This EEG study was done with scalp electrodes positioned according to the 10-20 International system of electrode placement. Electrical activity was acquired at a sampling rate of 500Hz  and reviewed with a high frequency filter of 70Hz  and a low frequency filter of 1Hz . EEG data were recorded continuously and digitally stored. Description: The posterior dominant rhythm consists of 8 Hz activity of moderate voltage (25-35 uV) seen  predominantly in posterior head regions, symmetric and reactive to eye opening and eye closing. EEG showed intermittent generalized 3 to 6 Hz theta-delta slowing. Hyperventilation and photic stimulation were not performed.   ABNORMALITY - Intermittent slow, generalized IMPRESSION: This study is suggestive of mild diffuse encephalopathy, nonspecific etiology. No seizures or epileptiform discharges were seen throughout the recording. Lora Havens   CUP PACEART REMOTE DEVICE CHECK  Result Date: 11/16/2020 ILR summary report received. Battery status OK. Normal device function. No new symptom, tachy, brady, or pause episodes. No new AF episodes. Monthly summary reports and ROV/PRN Kathy Breach, RN, CCDS, [employer]   Subjective: Seen and examined at bedside and she was resting in the bed comfortably.  No nausea or vomiting.  Denies any chest pain or shortness breath.  No other concerns or complaints this time.  She is  medically stable to be discharged to ALF and this will be done tomorrow.  Discharge Exam: Vitals:   11/29/20 2157 11/30/20 1029  BP: 140/60 (!) 137/54  Pulse: 83 89  Resp: 20   Temp: 97.8 F (36.6 C)   SpO2: 94% 96%   Vitals:   11/29/20 0828 11/29/20 1301 11/29/20 2157 11/30/20 1029  BP: 130/80 (!) 114/46 140/60 (!) 137/54  Pulse: 93 76 83 89  Resp:  20 20   Temp:  98 F (36.7 C) 97.8 F (36.6 C)   TempSrc:   Oral   SpO2:  94% 94% 96%  Weight:      Height:       General: Pt is awake and in no acute distress but is pleasantly demented  Cardiovascular: RRR, S1/S2 +, no rubs, no gallops Respiratory: Diminished bilaterally, no wheezing, no rhonchi Abdominal: Soft, NT, ND, bowel sounds + Extremities: no edema, no cyanosis  The results of significant diagnostics from this hospitalization (including imaging, microbiology, ancillary and laboratory) are listed below for reference.     Microbiology: Recent Results (from the past 240 hour(s))  Resp Panel by RT-PCR (Flu A&B, Covid) Nasopharyngeal Swab     Status: None   Collection Time: 11/26/20 12:49 PM   Specimen: Nasopharyngeal Swab; Nasopharyngeal(NP) swabs in vial transport medium  Result Value Ref Range Status   SARS Coronavirus 2 by RT PCR NEGATIVE NEGATIVE Final    Comment: (NOTE) SARS-CoV-2 target nucleic acids are NOT DETECTED.  The SARS-CoV-2 RNA is generally detectable in upper respiratory specimens during the acute phase of infection. The lowest concentration of SARS-CoV-2 viral copies this assay can detect is 138 copies/mL. A negative result does not preclude SARS-Cov-2 infection and should not be used as the sole basis for treatment or other patient management decisions. A negative result may occur with  improper specimen collection/handling, submission of specimen other than nasopharyngeal swab, presence of viral mutation(s) within the areas targeted by this assay, and inadequate number of viral copies(<138  copies/mL). A negative result must be combined with clinical observations, patient history, and epidemiological information. The expected result is Negative.  Fact Sheet for Patients:  EntrepreneurPulse.com.au  Fact Sheet for Healthcare Providers:  IncredibleEmployment.be  This test is no t yet approved or cleared by the [place] FDA and  has been authorized for detection and/or diagnosis of SARS-CoV-2 by FDA under an Emergency Use Authorization (EUA). This EUA will remain  in effect (meaning this test can be used) for the duration of the COVID-19 declaration under Section 564(b)(1) of the Act, 21 U.S.C.section 360bbb-3(b)(1), unless the authorization is terminated  or revoked sooner.  Influenza A by PCR NEGATIVE NEGATIVE Final   Influenza B by PCR NEGATIVE NEGATIVE Final    Comment: (NOTE) The Xpert Xpress SARS-CoV-2/FLU/RSV plus assay is intended as an aid in the diagnosis of influenza from Nasopharyngeal swab specimens and should not be used as a sole basis for treatment. Nasal washings and aspirates are unacceptable for Xpert Xpress SARS-CoV-2/FLU/RSV testing.  Fact Sheet for Patients: EntrepreneurPulse.com.au  Fact Sheet for Healthcare Providers: IncredibleEmployment.be  This test is not yet approved or cleared by the [place] FDA and has been authorized for detection and/or diagnosis of SARS-CoV-2 by FDA under an Emergency Use Authorization (EUA). This EUA will remain in effect (meaning this test can be used) for the duration of the COVID-19 declaration under Section 564(b)(1) of the Act, 21 U.S.C. section 360bbb-3(b)(1), unless the authorization is terminated or revoked.  Performed at [facility], [address] [place], [place] [address]   Urine culture     Status: Abnormal   Collection Time: 11/26/20  4:41 PM   Specimen: Urine, Clean Catch  Result Value Ref Range Status    Specimen Description   Final    URINE, CLEAN CATCH Performed at [facility], [address] [place], [place] [address]    Special Requests   Final    NONE Performed at [facility], [address], [place], [place] [address]    Culture >=100,000 COLONIES/mL ESCHERICHIA COLI (A)  Final   Report Status 11/29/2020 FINAL  Final   Organism ID, Bacteria ESCHERICHIA COLI (A)  Final      Susceptibility   Escherichia coli - MIC*    AMPICILLIN >=32 RESISTANT Resistant     CEFAZOLIN <=4 SENSITIVE Sensitive     CEFEPIME <=0.12 SENSITIVE Sensitive     CEFTRIAXONE <=0.25 SENSITIVE Sensitive     CIPROFLOXACIN <=0.25 SENSITIVE Sensitive     GENTAMICIN <=1 SENSITIVE Sensitive     IMIPENEM <=0.25 SENSITIVE Sensitive     NITROFURANTOIN <=16 SENSITIVE Sensitive     TRIMETH/SULFA <=20 SENSITIVE Sensitive     AMPICILLIN/SULBACTAM >=32 RESISTANT Resistant     PIP/TAZO <=4 SENSITIVE Sensitive     * >=100,000 COLONIES/mL ESCHERICHIA COLI  Culture, blood (routine x 2)     Status: None (Preliminary result)   Collection Time: 11/26/20  6:32 PM   Specimen: Right Antecubital; Blood  Result Value Ref Range Status   Specimen Description RIGHT ANTECUBITAL  Final   Special Requests   Final    Blood Culture results may not be optimal due to an inadequate volume of blood received in culture bottles BOTTLES DRAWN AEROBIC AND ANAEROBIC   Culture   Final    NO GROWTH 3 DAYS Performed at [facility], [address], [place], [place] [address]    Report Status PENDING  Incomplete  Culture, blood (routine x 2)     Status: None (Preliminary result)   Collection Time: 11/26/20  6:42 PM   Specimen: Left Antecubital; Blood  Result Value Ref Range Status   Specimen Description LEFT ANTECUBITAL  Final   Special Requests   Final    Blood Culture results may not be optimal due to an inadequate volume of blood received in culture bottles BOTTLES DRAWN AEROBIC AND ANAEROBIC   Culture   Final    NO GROWTH 3 DAYS Performed  at [facility], [address] [place], [place] [address]    Report Status PENDING  Incomplete     Labs: BNP (last 3 results) Recent Labs  10/09/20 1426  BNP 35.3   Basic Metabolic Panel: Recent Labs  Lab 11/26/20 1214 11/28/20 0404 11/29/20 0344 11/30/20 0559  NA 139 138 139 138  K 4.5 3.4* 3.4* 5.0  CL 102 101 104 108  CO2 29 27 26 25   GLUCOSE 127* 127* 151* 170*  BUN 21 13 18  24*  CREATININE 1.02* 0.73 0.78 1.03*  CALCIUM 9.8 8.9 8.6* 8.3*  MG  --   --  1.8 2.2  PHOS  --   --   --  2.6   Liver Function Tests: Recent Labs  Lab 11/26/20 1214 11/28/20 0404  AST 20 19  ALT 13 12  ALKPHOS 43 38  BILITOT 0.5 1.0  PROT 6.5 6.6  ALBUMIN 3.7 4.0   No results for input(s): LIPASE, AMYLASE in the last 168 hours. No results for input(s): AMMONIA in the last 168 hours. CBC: Recent Labs  Lab 11/26/20 1214 11/28/20 0404 11/29/20 0344  WBC 5.8 8.0 7.3  NEUTROABS 4.2  --  4.8  HGB 13.4 13.4 13.5  HCT 41.7 41.2 41.5  MCV 91.2 89.2 90.4  PLT 220 230 229   Cardiac Enzymes: No results for input(s): CKTOTAL, CKMB, CKMBINDEX, TROPONINI in the last 168 hours. BNP: Invalid input(s): POCBNP CBG: Recent Labs  Lab 11/29/20 0810 11/29/20 1107 11/29/20 1602 11/29/20 2159 11/30/20 0810  GLUCAP 158* 184* 229* 147* 164*   D-Dimer No results for input(s): DDIMER in the last 72 hours. Hgb A1c Recent Labs    11/28/20 0404  HGBA1C 6.2*   Lipid Profile No results for input(s): CHOL, HDL, LDLCALC, TRIG, CHOLHDL, LDLDIRECT in the last 72 hours. Thyroid function studies Recent Labs    11/28/20 0404  TSH 2.074   Anemia work up Recent Labs    11/28/20 0404  FOLATE 12.5   Urinalysis    Component Value Date/Time   COLORURINE YELLOW 11/26/2020 1641   APPEARANCEUR HAZY (A) 11/26/2020 1641   LABSPEC 1.014 11/26/2020 1641   PHURINE 6.0 11/26/2020 1641   GLUCOSEU NEGATIVE 11/26/2020 1641   [place] 11/26/2020 1641   [place] 11/26/2020 1641    BILIRUBINUR negative 06/28/2020 1108   [place] 11/26/2020 1641   PROTEINUR NEGATIVE 11/26/2020 1641   UROBILINOGEN 1.0 06/28/2020 1108   NITRITE POSITIVE (A) 11/26/2020 1641   LEUKOCYTESUR NEGATIVE 11/26/2020 1641   Sepsis Labs Invalid input(s): PROCALCITONIN,  WBC,  LACTICIDVEN Microbiology Recent Results (from the past 240 hour(s))  Resp Panel by RT-PCR (Flu A&B, Covid) Nasopharyngeal Swab     Status: None   Collection Time: 11/26/20 12:49 PM   Specimen: Nasopharyngeal Swab; Nasopharyngeal(NP) swabs in vial transport medium  Result Value Ref Range Status   SARS Coronavirus 2 by RT PCR NEGATIVE NEGATIVE Final    Comment: (NOTE) SARS-CoV-2 target nucleic acids are NOT DETECTED.  The SARS-CoV-2 RNA is generally detectable in upper respiratory specimens during the acute phase of infection. The lowest concentration of SARS-CoV-2 viral copies this assay can detect is 138 copies/mL. A negative result does not preclude SARS-Cov-2 infection and should not be used as the sole basis for treatment or other patient management decisions. A negative result may occur with  improper specimen collection/handling, submission of specimen other than nasopharyngeal swab, presence of viral mutation(s) within the areas targeted by this assay, and inadequate number of viral copies(<138 copies/mL). A negative result must be combined with clinical observations, patient history, and epidemiological information. The expected result is Negative.  Fact Sheet for Patients:  EntrepreneurPulse.com.au  Fact  Sheet for Healthcare Providers:  IncredibleEmployment.be  This test is no t yet approved or cleared by the [place] FDA and  has been authorized for detection and/or diagnosis of SARS-CoV-2 by FDA under an Emergency Use Authorization (EUA). This EUA will remain  in effect (meaning this test can be used) for the duration of the COVID-19 declaration  under Section 564(b)(1) of the Act, 21 U.S.C.section 360bbb-3(b)(1), unless the authorization is terminated  or revoked sooner.       Influenza A by PCR NEGATIVE NEGATIVE Final   Influenza B by PCR NEGATIVE NEGATIVE Final    Comment: (NOTE) The Xpert Xpress SARS-CoV-2/FLU/RSV plus assay is intended as an aid in the diagnosis of influenza from Nasopharyngeal swab specimens and should not be used as a sole basis for treatment. Nasal washings and aspirates are unacceptable for Xpert Xpress SARS-CoV-2/FLU/RSV testing.  Fact Sheet for Patients: EntrepreneurPulse.com.au  Fact Sheet for Healthcare Providers: IncredibleEmployment.be  This test is not yet approved or cleared by the [place] FDA and has been authorized for detection and/or diagnosis of SARS-CoV-2 by FDA under an Emergency Use Authorization (EUA). This EUA will remain in effect (meaning this test can be used) for the duration of the COVID-19 declaration under Section 564(b)(1) of the Act, 21 U.S.C. section 360bbb-3(b)(1), unless the authorization is terminated or revoked.  Performed at [facility], [address], [place], [place] [address]   Urine culture     Status: Abnormal   Collection Time: 11/26/20  4:41 PM   Specimen: Urine, Clean Catch  Result Value Ref Range Status   Specimen Description   Final    URINE, CLEAN CATCH Performed at [facility], [address], [place], [place] [address]    Special Requests   Final    NONE Performed at [facility], [address], [place], [place] [address]    Culture >=100,000 COLONIES/mL ESCHERICHIA COLI (A)  Final   Report Status 11/29/2020 FINAL  Final   Organism ID, Bacteria ESCHERICHIA COLI (A)  Final      Susceptibility   Escherichia coli - MIC*    AMPICILLIN >=32 RESISTANT Resistant     CEFAZOLIN <=4 SENSITIVE Sensitive     CEFEPIME <=0.12 SENSITIVE Sensitive     CEFTRIAXONE <=0.25 SENSITIVE Sensitive     CIPROFLOXACIN  <=0.25 SENSITIVE Sensitive     GENTAMICIN <=1 SENSITIVE Sensitive     IMIPENEM <=0.25 SENSITIVE Sensitive     NITROFURANTOIN <=16 SENSITIVE Sensitive     TRIMETH/SULFA <=20 SENSITIVE Sensitive     AMPICILLIN/SULBACTAM >=32 RESISTANT Resistant     PIP/TAZO <=4 SENSITIVE Sensitive     * >=100,000 COLONIES/mL ESCHERICHIA COLI  Culture, blood (routine x 2)     Status: None (Preliminary result)   Collection Time: 11/26/20  6:32 PM   Specimen: Right Antecubital; Blood  Result Value Ref Range Status   Specimen Description RIGHT ANTECUBITAL  Final   Special Requests   Final    Blood Culture results may not be optimal due to an inadequate volume of blood received in culture bottles BOTTLES DRAWN AEROBIC AND ANAEROBIC   Culture   Final    NO GROWTH 3 DAYS Performed at [facility], [address] [place],  [address]    Report Status PENDING  Incomplete  Culture, blood (routine x 2)     Status: None (Preliminary result)   Collection Time: 11/26/20  6:42 PM   Specimen: Left Antecubital; Blood  Result Value Ref Range Status   Specimen  Description LEFT ANTECUBITAL  Final   Special Requests   Final    Blood Culture results may not be optimal due to an inadequate volume of blood received in culture bottles BOTTLES DRAWN AEROBIC AND ANAEROBIC   Culture   Final    NO GROWTH 3 DAYS Performed at [facility], [address] [place], [place] [address]    Report Status PENDING  Incomplete   Time coordinating discharge: 35 minutes  SIGNED:  Kerney Elbe, DO [facility] 11/30/2020, 11:07 AM Pager is on [place]  If 7PM-7AM, please contact night-coverage [email or web address]

## 2020-11-29 NOTE — Care Management Important Message (Signed)
Important Message  Patient Details  Name: Amy [name] MRN: [id number] Date of Birth: April 08, 1936   Medicare Important Message Given:  Yes     Tommy Medal 11/29/2020, 3:55 PM

## 2020-11-30 ENCOUNTER — Inpatient Hospital Stay (HOSPITAL_COMMUNITY): Payer: Medicare HMO

## 2020-11-30 DIAGNOSIS — N179 Acute kidney failure, unspecified: Secondary | ICD-10-CM

## 2020-11-30 LAB — RESP PANEL BY RT-PCR (FLU A&B, COVID) ARPGX2
Influenza A by PCR: NEGATIVE
Influenza B by PCR: NEGATIVE
SARS Coronavirus 2 by RT PCR: NEGATIVE

## 2020-11-30 LAB — GLUCOSE, CAPILLARY
Glucose-Capillary: 164 mg/dL — ABNORMAL HIGH (ref 70–99)
Glucose-Capillary: 128 mg/dL — ABNORMAL HIGH (ref 70–99)
Glucose-Capillary: 222 mg/dL — ABNORMAL HIGH (ref 70–99)
Glucose-Capillary: 178 mg/dL — ABNORMAL HIGH (ref 70–99)

## 2020-11-30 LAB — BASIC METABOLIC PANEL
Anion gap: 5 (ref 5–15)
BUN: 24 mg/dL — ABNORMAL HIGH (ref 8–23)
CO2: 25 mmol/L (ref 22–32)
Calcium: 8.3 mg/dL — ABNORMAL LOW (ref 8.9–10.3)
Chloride: 108 mmol/L (ref 98–111)
Creatinine, Ser: 1.03 mg/dL — ABNORMAL HIGH (ref 0.44–1.00)
GFR, Estimated: 54 mL/min — ABNORMAL LOW (ref >60)
Glucose, Bld: 170 mg/dL — ABNORMAL HIGH (ref 70–99)
Potassium: 5 mmol/L (ref 3.5–5.1)
Sodium: 138 mmol/L (ref 135–145)

## 2020-11-30 LAB — COMPREHENSIVE METABOLIC PANEL
ALT: 19 U/L (ref 0–44)
AST: 19 U/L (ref 15–41)
Albumin: 3.5 g/dL (ref 3.5–5.0)
Alkaline Phosphatase: 39 U/L (ref 38–126)
Anion gap: 5 (ref 5–15)
BUN: 20 mg/dL (ref 8–23)
CO2: 25 mmol/L (ref 22–32)
Calcium: 8 mg/dL — ABNORMAL LOW (ref 8.9–10.3)
Chloride: 109 mmol/L (ref 98–111)
Creatinine, Ser: 0.87 mg/dL (ref 0.44–1.00)
GFR, Estimated: >60 mL/min (ref >60)
Glucose, Bld: 124 mg/dL — ABNORMAL HIGH (ref 70–99)
Potassium: 4.7 mmol/L (ref 3.5–5.1)
Sodium: 139 mmol/L (ref 135–145)
Total Bilirubin: 0.4 mg/dL (ref 0.3–1.2)
Total Protein: 6.2 g/dL — ABNORMAL LOW (ref 6.5–8.1)

## 2020-11-30 LAB — MAGNESIUM: Magnesium: 2.2 mg/dL (ref 1.7–2.4)

## 2020-11-30 LAB — PHOSPHORUS: Phosphorus: 2.6 mg/dL (ref 2.5–4.6)

## 2020-11-30 MED ORDER — ACETAMINOPHEN 325 MG PO TABS: 650.0000 mg | ORAL_TABLET | Freq: Four times a day (QID) | ORAL | Status: DC | PRN

## 2020-11-30 MED ORDER — HEPARIN SODIUM (PORCINE) 5000 UNIT/ML IJ SOLN
5000.0000 [IU] | Freq: Three times a day (TID) | INTRAMUSCULAR | Status: DC
  Administered 2020-11-30 – 2020-12-01 (×3): 5000 [IU] via SUBCUTANEOUS
  Filled 2020-11-30 (×3): qty 1

## 2020-11-30 MED ORDER — QUETIAPINE FUMARATE 25 MG PO TABS: 25.0000 mg | ORAL_TABLET | Freq: Two times a day (BID) | ORAL | Status: DC

## 2020-11-30 MED ORDER — ALPRAZOLAM 0.5 MG PO TABS: 0.5000 mg | ORAL_TABLET | Freq: Every day | ORAL | 0 refills | Status: DC

## 2020-11-30 MED ORDER — POLYVINYL ALCOHOL 1.4 % OP SOLN: 1.0000 [drp] | OPHTHALMIC | 0 refills | Status: DC | PRN

## 2020-11-30 MED ORDER — SODIUM CHLORIDE 0.9 % IV SOLN: INTRAVENOUS | Status: DC

## 2020-11-30 MED ORDER — POLYETHYLENE GLYCOL 3350 17 G PO PACK: 17.0000 g | PACK | Freq: Every day | ORAL | 0 refills | Status: DC | PRN

## 2020-11-30 MED ORDER — ADULT MULTIVITAMIN W/MINERALS CH
1.0000 | ORAL_TABLET | Freq: Every day | ORAL | Status: DC
  Administered 2020-11-30 – 2020-12-01 (×2): 1 via ORAL
  Filled 2020-11-30 (×2): qty 1

## 2020-11-30 MED ORDER — CEPHALEXIN 500 MG PO CAPS: 500.0000 mg | ORAL_CAPSULE | Freq: Three times a day (TID) | ORAL | 0 refills | Status: AC

## 2020-11-30 MED ORDER — ONDANSETRON HCL 4 MG PO TABS: 4.0000 mg | ORAL_TABLET | Freq: Four times a day (QID) | ORAL | 0 refills | Status: DC | PRN

## 2020-11-30 MED ORDER — SODIUM CHLORIDE 0.9 % IV BOLUS
500.0000 mL | Freq: Once | INTRAVENOUS | Status: AC
  Administered 2020-11-30: 500 mL via INTRAVENOUS

## 2020-11-30 MED ORDER — ADULT MULTIVITAMIN W/MINERALS CH: 1.0000 | ORAL_TABLET | Freq: Every day | ORAL | Status: DC

## 2020-11-30 MED ORDER — ENSURE ENLIVE PO LIQD: 237.0000 mL | Freq: Two times a day (BID) | ORAL | 12 refills | Status: DC

## 2020-11-30 NOTE — Progress Notes (Signed)
Palliative: Amy [name] is lying quietly in bed.  She wakes easily when I walk into the room.  She appears acutely/chronically ill and quite frail.  She is alert, oriented to person and situation, but with known memory loss.  She is able to make her basic needs known.  There is no family at bedside at this time.  Amy [name] tells me that she is not feeling well, sharing that she does not feel that she is getting better.  I try to reassure her.  Call to son, Amy [name].  He shares that they are looking to discharge to [facility] in [place].  He shares that he is both healthcare and [place], and has handled [name]'s affairs for several years.  He is agreeable to outpatient palliative services, but request [facility], as they will be communicating with him, not Amy [name].  If Amy [name] will not accept her in [place], the other choice would be [facility].  This information is shared with Amy [name].  Conference with attending, bedside nursing staff, transition of care team related to patient condition, needs, goals of care, disposition.  Plan: Discharged to [facility] in [place].  Requesting outpatient palliative services with [facility] of [facility].  DNR status, goldenrod form completed.  25 minutes  Quinn Axe, NP Palliative medicine team Team phone [phone] Greater than 50% of this time was spent counseling and coordinating care related to the above assessment and plan.

## 2020-11-30 NOTE — Progress Notes (Signed)
Nutrition Follow-up  DOCUMENTATION CODES:   Not applicable  INTERVENTION:   -Continue Ensure Enlive po BID, each supplement provides 350 kcal and 20 grams of protein -Continue [place] yogurt with breakfast daily -MVI with minerals daily  NUTRITION DIAGNOSIS:   Inadequate oral intake related to acute illness (acute metabolic encephalopathy) as evidenced by meal completion < 25%.  Ongoing  GOAL:   Patient will meet greater than or equal to 90% of their needs  Progressing   MONITOR:   PO intake,Supplement acceptance,Labs,Skin,Weight trends  REASON FOR ASSESSMENT:   Malnutrition Screening Tool    ASSESSMENT:   Patient is a 85 yo female with hx of HF, stroke, DM and dementia.  She presents from home with progressive dementia and concern for her safety due to wandering out of her house. Acute metabolic encephalopathy. Disposition SNF.  Reviewed I/O's: -132 ml x 24 hours and +2 L x 24 hours  UOP: 1 L x 24 hours  Spoke with pt at bedside. She answered "I don't know" or "yes" to all questions asked. Noted breakfast tray on tray table, untouched. Per chart review, pt is able to feed herself. Documented meal completions 25-75%. Pt is also consuming Ensure Enlive supplements.   Medications reviewed and include sodium chloride infusion @ 75 ml/hr.   Per TOC notes, insurance requesting peer to peer for discharge to SNF.   Labs reviewed: CBGS: [id number] (inpatient orders for glycemic control are 0-5 units insulin aspart daily at bedtime and 0-9 units insulin aspart TID with meals).   Diet Order:   Diet Order            Diet regular Room service appropriate? Yes; Fluid consistency: Thin  Diet effective now                 EDUCATION NEEDS:   Not appropriate for education at this time  Skin:  Skin Assessment: Reviewed RN Assessment  Last BM:  Unknown  Height:   Ht Readings from Last 1 Encounters:  11/26/20 5\' 4"  (1.626 m)    Weight:   Wt Readings from Last 1  Encounters:  11/26/20 73 kg    Ideal Body Weight:  54.5 kg  BMI:  Body mass index is 27.62 kg/m.  Estimated Nutritional Needs:   Kcal:  1700-1850  Protein:  85-90 gr  Fluid:  < 2 liters daily    Loistine Chance, RD, LDN, [place] Registered Dietitian II Certified Diabetes Care and Education Specialist Please refer to [facility] for RD and/or RD on-call/weekend/after hours pager

## 2020-11-30 NOTE — TOC Progression Note (Signed)
Transition of Care [facility]) - Progression Note    Patient Details  Name: Amy [name] MRN: [id number] Date of Birth: 09-13-1935  Transition of Care [facility]) CM/SW [place], [place] Phone Number: 11/30/2020, 1:47 PM  Clinical Narrative:    Pt was denied insurance auth for SNF. CSW reached out to pts son/POA about new plan for d/c. He states that pt cannot return home as she lives alone and needs more care. Family is interested in [facility]. CSW reached out to [facility] and spoke with April who states she does have private rooms available in the memory care unit. CSW informed pts son and provided him with Aprils number to reach out to gain more information about facility. CSW was updated by pts son that they are interested in moving forward with [facility]. CSW has faxed all requested information to April phone: [phone]  And fax: [phone]. Due to paperwork needing to be completed by facility MD they cannot accept pt until tomorrow morning. CSW updated pts son of this and he is understanding. Facility also requests that pt have COVID test and chest xray completed, CSW faxed results to [facility]. Facility also requests that orders for hospital bed and walker be sent to [facility]. CSW faxed requested orders and notes to [facility]. TOC to follow.    Expected Discharge Plan: [place] Barriers to Discharge: Continued Medical Work up  Expected Discharge Plan and Services Expected Discharge Plan: [place] In-house Referral: Clinical Social Work Discharge Planning Services: CM Consult Post Acute Care Choice: [place]                   DME Arranged: N/A DME Agency: NA       HH Arranged: NA HH Agency: NA         Social Determinants of Health (SDOH) Interventions    Readmission Risk Interventions Readmission Risk Prevention Plan 11/27/2020 08/19/2019  Transportation Screening Complete  Complete  PCP or Specialist Appt within 3-5 Days - Not Complete  HRI or Home Care Consult Complete Complete  Social Work Consult for [place] Planning/Counseling Complete Complete  Palliative Care Screening Not Applicable Not Complete  Medication Review Press photographer) Complete Complete  Some recent data might be hidden

## 2020-11-30 NOTE — TOC Progression Note (Signed)
Patient requires frequent re-positioning of the body in ways that cannot be achieved with an ordinary bed or wedge pillow, to eliminate pain, reduce pressure, and the head of the bed to be elevated more than 30 degrees most of the time due to Chronic systolic congestive heart failure.

## 2020-12-01 LAB — BASIC METABOLIC PANEL
Anion gap: 3 — ABNORMAL LOW (ref 5–15)
BUN: 21 mg/dL (ref 8–23)
CO2: 25 mmol/L (ref 22–32)
Calcium: 8.3 mg/dL — ABNORMAL LOW (ref 8.9–10.3)
Chloride: 110 mmol/L (ref 98–111)
Creatinine, Ser: 0.76 mg/dL (ref 0.44–1.00)
GFR, Estimated: >60 mL/min (ref >60)
Glucose, Bld: 180 mg/dL — ABNORMAL HIGH (ref 70–99)
Potassium: 4.6 mmol/L (ref 3.5–5.1)
Sodium: 138 mmol/L (ref 135–145)

## 2020-12-01 LAB — CULTURE, BLOOD (ROUTINE X 2)
Culture: NO GROWTH
Culture: NO GROWTH

## 2020-12-01 LAB — GLUCOSE, CAPILLARY
Glucose-Capillary: 169 mg/dL — ABNORMAL HIGH (ref 70–99)
Glucose-Capillary: 137 mg/dL — ABNORMAL HIGH (ref 70–99)

## 2020-12-01 NOTE — Discharge Summary (Addendum)
Physician Discharge Summary  Amy [name] [id number] DOB: 1936-04-04 DOA: 11/26/2020  PCP: [name], MD  Admit date: 11/26/2020 Discharge date: 12/01/2020  Admitted From: Home Disposition: ALF Memory Care Unit   Recommendations for Outpatient Follow-up:  1. Follow up with PCP in 1-2 weeks 2. Have Palliative Care follow up at ALF 3. Please obtain CMP/CBC , Mag, Phos in one week 4. Please follow up on the following pending results:  Home Health: No Equipment/Devices: None  Discharge Condition: Stable CODE STATUS: DO NOT RESUSCITATE Diet recommendation: Regular Diet   Brief/Interim Summary: The Patient is an 85 year old Caucasian female with a past medical history significant for but not limited to dementia, history of CVA, history of systolic CHF, diabetes mellitus type 2 as well as other comorbidities who brought to the ED via EMS.  History is primarily obtained from patient's son who is the healthcare power of attorney.  Apparently the patient been wandering outside of her house symptoms progressively getting worse with her dementia and he has not been able to care for herself at home.  She currently lives by herself and there is also concerns medical reasons for her acute resume her medications and she was worked up and found to have a UTI.  She is being treated for a UTI but mentation remains the same.  PT OT initially recommended SNF but she has been unfortunately denied by her insurance and did not get a call back for the peer to peer so now she will have regular ALF memory care unit.  She is medically stable to be discharged and did have a slight AKI but upon rehydration and reevaluation AKI is improved and resolved.  ADDENDUM 12/01/20: Patient did not go yesterday because paperwork that needed to be completed by facility MD. She remains medically stable to D/C to ALF at this point. Repeat CXR was unremarkable and COVID Testing was Negative.   Discharge Diagnoses:  Principal  Problem:   Acute metabolic encephalopathy Active Problems:   Type 2 diabetes mellitus with neurological complications (HCC)   History of CVA (cerebrovascular accident)   Chronic systolic CHF (congestive heart failure) (HCC)   UTI (urinary tract infection)   Cryptogenic stroke (HCC)   Progressive dementia versus acute metabolic encephalopathy; Likley progressive Dementia -CT head did not show any acute findings. Age-related atrophy was noted.  -UA was noted to be abnormal and the patient was started on antibiotics as below -Patient's metabolic work-up has been unremarkable so far including normal folic acid level and normal thyroid function test. -Patient's vitamin B12 level was 1435 in December 2021.  -RPR was negative last year. EEG was also done which did not show any epileptiform activity. -Patient was quite agitated the day before yesterday and was trying to get out of the bed.  -She was given Haldol and Seroquel with improvement. Home medication list reviewed. Will add back the Risperdal that she was taking at nighttime.  -Hoping that patient mentation improves as her UTI is treated but still pleasantly demented  -PT/OT initially recommended SNF but she was denied by [employer]; Awaiting call back from Peer to Peer  Urinary tract infectionwith E. coli -UA was noted to be abnormal.  -She was started on ceftriaxone.  -Urine culture growing E. Coli with Resistance only to Ampicillin and Ampicillin/Sulbactam.  -Continued IV Ceftriaxone and changed to po Keflex 500 mg TID.  -PT/OT consulted   Chronic Systolic CHF -Echocardiogram from March 2021 showed EF to be 35 to 40% with grade 1  diastolic dysfunction. -Continue metoprolol Succinate.  -Also noted to be on aspirin. Seems to be euvolemic. -Continue to Monitor for S/Sx of Volume Overload now that she is being given IV fluid hydration  AKI -Patient's BUN/Cr went from 13/0.73 -> 18/0.78 -> 24/1.03 -> 20/0.87 ->  21/0.76 -Given a NS bolus this AM and started on gentle Maintenance IVF -Avoid further nephrotoxic medications, contrast dyes, hypotension renally dose medications -Encouraged p.o. intake -Repeat CMP at the facility  Diabetes Mellitus Type 2 -CBGs are reasonably well controlled -Patient's hemoglobin A1c was 6.2 -Continue with sliding scale -CBG's ranging from 124-222  Essential Hypertension -C/w Metoprolol Succinate 25 mg po Daily  -Continue to Monitor BP per Protocol -Last BP was 149/58  Chronic Dementia with Behavioral Disturbances  -Appears baseline -C/w Quetiapine 25 mg po BID and Risperidone 1 mg po qHS -C/w Haloperidol Lactate 1 mg IV q6hprn Agitation  -EEG done and showed "This study is suggestive of mild diffuse encephalopathy, nonspecific etiology.No seizures or epileptiform discharges were seen throughout the recording." -C/w Donepezil 5 mg po Daily and Escitalopram 20 mg po Daily and Alprazolam 0.5 mg po qHS -C/w Delirium Precautions and Re-Orientation  Hypokalemia -Patient's K+ was 3.4 and now improved to 4.5 -Continue to Monitor and  replete as Necessary -Repeat CMP within 1 week   History of stage Ia breast cancer involving the right breast -She is status postlumpectomy in 2018  -currently on Letrozole 5 mg -Follow-up with oncology at the [facility]  History of cryptogenic stroke -On aspirin 81 mg p.o. daily -Currently not noted to be on a statin  Goals of Care:  -Currently she is DO NOT RESUSCITATE -Appreciate palliative care assistance and evaluation -Plan Was for the patient to go to skilled nursing rehabilitation and close follow-up with palliative care as an outpatient but Insurance denied her Authorization -Called for Peer to Peer at 15:40  Yesterday and awaiting call back reference [id number]. Likely she will be denied and now will go to ALF   Discharge Instructions   Allergies as of 12/01/2020      Reactions   Cymbalta  [duloxetine Hcl] Other (See Comments)   Hallucinated and couldn't think; got worse and worse the longer she took this.   Duloxetine       Medication List    STOP taking these medications   Acetaminophen-Codeine 300-15 MG Tabs   furosemide 20 MG tablet Commonly known as: LASIX   venlafaxine XR 150 MG 24 hr capsule Commonly known as: EFFEXOR-XR     TAKE these medications   acetaminophen 325 MG tablet Commonly known as: TYLENOL Take 2 tablets (650 mg total) by mouth every 6 (six) hours as needed for mild pain (or Fever >/= 101).   ALPRAZolam 0.5 MG tablet Commonly known as: XANAX Take 1 tablet (0.5 mg total) by mouth at bedtime.   aspirin EC 81 MG tablet Take 81 mg by mouth every morning.   calcium carbonate 1500 (600 Ca) MG Tabs tablet Commonly known as: OSCAL Take 600 mg of elemental calcium by mouth daily with supper.   cephALEXin 500 MG capsule Commonly known as: KEFLEX Take 1 capsule (500 mg total) by mouth 3 (three) times daily for 3 days.   cholecalciferol 1000 units tablet Commonly known as: VITAMIN D Take 1 tablet (1,000 Units total) by mouth daily.   donepezil 5 MG tablet Commonly known as: ARICEPT Take 1 tablet (5 mg total) by mouth daily.   escitalopram 20 MG tablet Commonly known as:  LEXAPRO TAKE 1 TABLET(20 MG) BY MOUTH DAILY What changed: See the new instructions.   feeding supplement Liqd Take 237 mLs by mouth 2 (two) times daily between meals.   gabapentin 100 MG capsule Commonly known as: NEURONTIN Take 200 mg by mouth at bedtime.   letrozole 2.5 MG tablet Commonly known as: FEMARA TAKE 1 TABLET(2.5 MG) BY MOUTH DAILY   metFORMIN 500 MG tablet Commonly known as: GLUCOPHAGE Take 1 tablet (500 mg total) by mouth 2 (two) times daily with a meal.   metoprolol succinate 25 MG 24 hr tablet Commonly known as: TOPROL-XL Take 1 tablet (25 mg total) by mouth daily. Please keep upcoming appt in January 2022 before anymore refills. Thank you    multivitamin with minerals Tabs tablet Take 1 tablet by mouth daily.   ondansetron 4 MG tablet Commonly known as: ZOFRAN Take 1 tablet (4 mg total) by mouth every 6 (six) hours as needed for nausea.   oxybutynin 5 MG tablet Commonly known as: DITROPAN Take 5 mg by mouth at bedtime.   polyethylene glycol 17 g packet Commonly known as: MIRALAX / GLYCOLAX Take 17 g by mouth daily as needed for mild constipation.   polyvinyl alcohol 1.4 % ophthalmic solution Commonly known as: LIQUIFILM TEARS Place 1 drop into both eyes as needed for dry eyes.   QUEtiapine 25 MG tablet Commonly known as: SEROQUEL Take 1 tablet (25 mg total) by mouth 2 (two) times daily. What changed: when to take this   risperiDONE 1 MG tablet Commonly known as: RISPERDAL Take 1 mg by mouth at bedtime. What changed: Another medication with the same name was removed. Continue taking this medication, and follow the directions you see here.            Durable Medical Equipment  (From admission, onward)         Start     Ordered   11/30/20 1135  For home use only DME Walker  Once       Question:  Patient needs a walker to treat with the following condition  Answer:  Generalized weakness   11/30/20 1135   11/30/20 1135  For home use only DME Hospital bed  Once       Question Answer Comment  Length of Need 6 Months   The above medical condition requires: Patient requires the ability to reposition frequently   Head must be elevated greater than: 30 degrees   Bed type Semi-electric   Support Surface: Gel Overlay      11/30/20 1135          Follow-up Information    [name], MD. Call today.   Specialty: Internal Medicine Why: Follow up within 1-2 weeks Contact information: [place] [address] [phone]        Fay Records, MD .   Specialty: Cardiology Contact information: [address] [place] [address] [phone]         Thompson Grayer, MD .   Specialty: Cardiology Contact information: [address] [place] [address] [phone]              Allergies  Allergen Reactions  . Cymbalta [Duloxetine Hcl] Other (See Comments)    Hallucinated and couldn't think; got worse and worse the longer she took this.  . Duloxetine     Consultations:  None  Procedures/Studies: CT HEAD WO CONTRAST  Result Date: 11/26/2020 CLINICAL DATA:  Initial  evaluation for acute confusion. EXAM: CT HEAD WITHOUT CONTRAST TECHNIQUE: Contiguous axial images were obtained from the base of the skull through the vertex without intravenous contrast. COMPARISON:  Prior MRI from 07/11/2020 FINDINGS: Brain: Generalized age-related cerebral atrophy with moderate chronic small vessel ischemic disease. No acute intracranial hemorrhage. No acute large vessel territory infarct. No mass lesion, midline shift or mass effect. No hydrocephalus or extra-axial fluid collection. Vascular: No hyperdense vessel. Calcified atherosclerosis present at the skull base. Skull: Scalp soft tissues and calvarium within normal limits. Sinuses/Orbits: Globes and orbital soft tissues demonstrate no acute finding. Paranasal sinuses are clear. No mastoid effusion. Other: None. IMPRESSION: 1. No acute intracranial abnormality. 2. Generalized age-related cerebral atrophy with moderate chronic small vessel ischemic disease. Electronically Signed   By: Jeannine Boga M.D.   On: 11/26/2020 21:15   DG CHEST PORT 1 VIEW  Result Date: 11/30/2020 CLINICAL DATA:  Shortness of breath, stroke, diabetes mellitus, breast cancer, dementia, has loop recorder EXAM: PORTABLE CHEST 1 VIEW COMPARISON:  Portable exam 1121 hours compared to 07/10/2020 FINDINGS: Loop recorder projects over mid LEFT chest. Normal heart size and pulmonary vascularity. Tortuous descending thoracic aorta again seen. Minimal scarring at LEFT base. No acute infiltrate, pleural effusion, or  pneumothorax. Prior thoracolumbar fusion. IMPRESSION: No acute abnormalities. Electronically Signed   By: Lavonia Dana M.D.   On: 11/30/2020 13:20   EEG adult  Result Date: 11/27/2020 Lora Havens, MD     11/27/2020  6:18 PM Patient Name: Amy [name] MRN: [id number] Epilepsy Attending: Lora Havens Referring Physician/Provider: Dr Bonnielee Haff Date: 11/27/2020 Duration: 23.57 mins Patient history: 85yo F with ams. EEG to evaluate for seizure. Level of alertness: Awake AEDs during EEG study: Xanax Technical aspects: This EEG study was done with scalp electrodes positioned according to the 10-20 International system of electrode placement. Electrical activity was acquired at a sampling rate of 500Hz  and reviewed with a high frequency filter of 70Hz  and a low frequency filter of 1Hz . EEG data were recorded continuously and digitally stored. Description: The posterior dominant rhythm consists of 8 Hz activity of moderate voltage (25-35 uV) seen predominantly in posterior head regions, symmetric and reactive to eye opening and eye closing. EEG showed intermittent generalized 3 to 6 Hz theta-delta slowing. Hyperventilation and photic stimulation were not performed.   ABNORMALITY - Intermittent slow, generalized IMPRESSION: This study is suggestive of mild diffuse encephalopathy, nonspecific etiology. No seizures or epileptiform discharges were seen throughout the recording. Lora Havens   CUP PACEART REMOTE DEVICE CHECK  Result Date: 11/16/2020 ILR summary report received. Battery status OK. Normal device function. No new symptom, tachy, brady, or pause episodes. No new AF episodes. Monthly summary reports and ROV/PRN Kathy Breach, RN, CCDS, [employer]   Subjective: Seen and examined at bedside and she was resting in the bed comfortably.  No nausea or vomiting.  Denies any chest pain or shortness breath.  No other concerns or complaints this time.  She is medically stable to be  discharged to ALF and this will be done tomorrow.  Discharge Exam: Vitals:   11/30/20 2130 12/01/20 0356  BP: 130/67 (!) 149/58  Pulse: 73 64  Resp: 18 19  Temp: (!) 97.3 F (36.3 C) (!) 97.3 F (36.3 C)  SpO2: 96% 100%   Vitals:   11/30/20 1029 11/30/20 1342 11/30/20 2130 12/01/20 0356  BP: (!) 137/54 (!) 153/76 130/67 (!) 149/58  Pulse: 89 81 73 64  Resp:  16  18 19  Temp:  97.8 F (36.6 C) (!) 97.3 F (36.3 C) (!) 97.3 F (36.3 C)  TempSrc:  Oral    SpO2: 96% 96% 96% 100%  Weight:      Height:       General: Pt is awake and in no acute distress but is pleasantly demented  Cardiovascular: RRR, S1/S2 +, no rubs, no gallops Respiratory: Diminished bilaterally, no wheezing, no rhonchi Abdominal: Soft, NT, ND, bowel sounds + Extremities: no edema, no cyanosis  The results of significant diagnostics from this hospitalization (including imaging, microbiology, ancillary and laboratory) are listed below for reference.     Microbiology: Recent Results (from the past 240 hour(s))  Resp Panel by RT-PCR (Flu A&B, Covid) Nasopharyngeal Swab     Status: None   Collection Time: 11/26/20 12:49 PM   Specimen: Nasopharyngeal Swab; Nasopharyngeal(NP) swabs in vial transport medium  Result Value Ref Range Status   SARS Coronavirus 2 by RT PCR NEGATIVE NEGATIVE Final    Comment: (NOTE) SARS-CoV-2 target nucleic acids are NOT DETECTED.  The SARS-CoV-2 RNA is generally detectable in upper respiratory specimens during the acute phase of infection. The lowest concentration of SARS-CoV-2 viral copies this assay can detect is 138 copies/mL. A negative result does not preclude SARS-Cov-2 infection and should not be used as the sole basis for treatment or other patient management decisions. A negative result may occur with  improper specimen collection/handling, submission of specimen other than nasopharyngeal swab, presence of viral mutation(s) within the areas targeted by this assay, and  inadequate number of viral copies(<138 copies/mL). A negative result must be combined with clinical observations, patient history, and epidemiological information. The expected result is Negative.  Fact Sheet for Patients:  EntrepreneurPulse.com.au  Fact Sheet for Healthcare Providers:  IncredibleEmployment.be  This test is no t yet approved or cleared by the [place] FDA and  has been authorized for detection and/or diagnosis of SARS-CoV-2 by FDA under an Emergency Use Authorization (EUA). This EUA will remain  in effect (meaning this test can be used) for the duration of the COVID-19 declaration under Section 564(b)(1) of the Act, 21 U.S.C.section 360bbb-3(b)(1), unless the authorization is terminated  or revoked sooner.       Influenza A by PCR NEGATIVE NEGATIVE Final   Influenza B by PCR NEGATIVE NEGATIVE Final    Comment: (NOTE) The Xpert Xpress SARS-CoV-2/FLU/RSV plus assay is intended as an aid in the diagnosis of influenza from Nasopharyngeal swab specimens and should not be used as a sole basis for treatment. Nasal washings and aspirates are unacceptable for Xpert Xpress SARS-CoV-2/FLU/RSV testing.  Fact Sheet for Patients: EntrepreneurPulse.com.au  Fact Sheet for Healthcare Providers: IncredibleEmployment.be  This test is not yet approved or cleared by the [place] FDA and has been authorized for detection and/or diagnosis of SARS-CoV-2 by FDA under an Emergency Use Authorization (EUA). This EUA will remain in effect (meaning this test can be used) for the duration of the COVID-19 declaration under Section 564(b)(1) of the Act, 21 U.S.C. section 360bbb-3(b)(1), unless the authorization is terminated or revoked.  Performed at [facility], [address] [place], [place] [address]   Urine culture     Status: Abnormal   Collection Time: 11/26/20  4:41 PM   Specimen: Urine, Clean  Catch  Result Value Ref Range Status   Specimen Description   Final    URINE, CLEAN CATCH Performed at [facility], [address] [place], [place] [address]    Special Requests  Final    NONE Performed at [facility], [address], [place], [place] [address]    Culture >=100,000 COLONIES/mL ESCHERICHIA COLI (A)  Final   Report Status 11/29/2020 FINAL  Final   Organism ID, Bacteria ESCHERICHIA COLI (A)  Final      Susceptibility   Escherichia coli - MIC*    AMPICILLIN >=32 RESISTANT Resistant     CEFAZOLIN <=4 SENSITIVE Sensitive     CEFEPIME <=0.12 SENSITIVE Sensitive     CEFTRIAXONE <=0.25 SENSITIVE Sensitive     CIPROFLOXACIN <=0.25 SENSITIVE Sensitive     GENTAMICIN <=1 SENSITIVE Sensitive     IMIPENEM <=0.25 SENSITIVE Sensitive     NITROFURANTOIN <=16 SENSITIVE Sensitive     TRIMETH/SULFA <=20 SENSITIVE Sensitive     AMPICILLIN/SULBACTAM >=32 RESISTANT Resistant     PIP/TAZO <=4 SENSITIVE Sensitive     * >=100,000 COLONIES/mL ESCHERICHIA COLI  Culture, blood (routine x 2)     Status: None   Collection Time: 11/26/20  6:32 PM   Specimen: Right Antecubital; Blood  Result Value Ref Range Status   Specimen Description RIGHT ANTECUBITAL  Final   Special Requests   Final    Blood Culture results may not be optimal due to an inadequate volume of blood received in culture bottles BOTTLES DRAWN AEROBIC AND ANAEROBIC   Culture   Final    NO GROWTH 5 DAYS Performed at [facility], [address] [place], [place] [address]    Report Status 12/01/2020 FINAL  Final  Culture, blood (routine x 2)     Status: None   Collection Time: 11/26/20  6:42 PM   Specimen: Left Antecubital; Blood  Result Value Ref Range Status   Specimen Description LEFT ANTECUBITAL  Final   Special Requests   Final    Blood Culture results may not be optimal due to an inadequate volume of blood received in culture bottles BOTTLES DRAWN AEROBIC AND ANAEROBIC   Culture   Final    NO GROWTH 5  DAYS Performed at [facility], [address], [place], [place] [address]    Report Status 12/01/2020 FINAL  Final  Resp Panel by RT-PCR (Flu A&B, Covid) Nasopharyngeal Swab     Status: None   Collection Time: 11/30/20 11:50 AM   Specimen: Nasopharyngeal Swab; Nasopharyngeal(NP) swabs in vial transport medium  Result Value Ref Range Status   SARS Coronavirus 2 by RT PCR NEGATIVE NEGATIVE Final    Comment: (NOTE) SARS-CoV-2 target nucleic acids are NOT DETECTED.  The SARS-CoV-2 RNA is generally detectable in upper respiratory specimens during the acute phase of infection. The lowest concentration of SARS-CoV-2 viral copies this assay can detect is 138 copies/mL. A negative result does not preclude SARS-Cov-2 infection and should not be used as the sole basis for treatment or other patient management decisions. A negative result may occur with  improper specimen collection/handling, submission of specimen other than nasopharyngeal swab, presence of viral mutation(s) within the areas targeted by this assay, and inadequate number of viral copies(<138 copies/mL). A negative result must be combined with clinical observations, patient history, and epidemiological information. The expected result is Negative.  Fact Sheet for Patients:  EntrepreneurPulse.com.au  Fact Sheet for Healthcare Providers:  IncredibleEmployment.be  This test is no t yet approved or cleared by the [place] FDA and  has been authorized for detection and/or diagnosis of SARS-CoV-2 by FDA under an Emergency Use Authorization (EUA). This EUA will remain  in effect (meaning this test can be used)  for the duration of the COVID-19 declaration under Section 564(b)(1) of the Act, 21 U.S.C.section 360bbb-3(b)(1), unless the authorization is terminated  or revoked sooner.       Influenza A by PCR NEGATIVE NEGATIVE Final   Influenza B by PCR NEGATIVE NEGATIVE Final    Comment:  (NOTE) The Xpert Xpress SARS-CoV-2/FLU/RSV plus assay is intended as an aid in the diagnosis of influenza from Nasopharyngeal swab specimens and should not be used as a sole basis for treatment. Nasal washings and aspirates are unacceptable for Xpert Xpress SARS-CoV-2/FLU/RSV testing.  Fact Sheet for Patients: EntrepreneurPulse.com.au  Fact Sheet for Healthcare Providers: IncredibleEmployment.be  This test is not yet approved or cleared by the [place] FDA and has been authorized for detection and/or diagnosis of SARS-CoV-2 by FDA under an Emergency Use Authorization (EUA). This EUA will remain in effect (meaning this test can be used) for the duration of the COVID-19 declaration under Section 564(b)(1) of the Act, 21 U.S.C. section 360bbb-3(b)(1), unless the authorization is terminated or revoked.  Performed at [facility], [address] [place], [place] [address]      Labs: BNP (last 3 results) Recent Labs    10/09/20 1426  BNP 0000000   Basic Metabolic Panel: Recent Labs  Lab 11/28/20 0404 11/29/20 0344 11/30/20 0559 11/30/20 1307 12/01/20 0608  NA 138 139 138 139 138  K 3.4* 3.4* 5.0 4.7 4.6  CL 101 104 108 109 110  CO2 27 26 25 25 25   GLUCOSE 127* 151* 170* 124* 180*  BUN 13 18 24* 20 21  CREATININE 0.73 0.78 1.03* 0.87 0.76  CALCIUM 8.9 8.6* 8.3* 8.0* 8.3*  MG  --  1.8 2.2  --   --   PHOS  --   --  2.6  --   --    Liver Function Tests: Recent Labs  Lab 11/26/20 1214 11/28/20 0404 11/30/20 1307  AST 20 19 19   ALT 13 12 19   ALKPHOS 43 38 39  BILITOT 0.5 1.0 0.4  PROT 6.5 6.6 6.2*  ALBUMIN 3.7 4.0 3.5   No results for input(s): LIPASE, AMYLASE in the last 168 hours. No results for input(s): AMMONIA in the last 168 hours. CBC: Recent Labs  Lab 11/26/20 1214 11/28/20 0404 11/29/20 0344  WBC 5.8 8.0 7.3  NEUTROABS 4.2  --  4.8  HGB 13.4 13.4 13.5  HCT 41.7 41.2 41.5  MCV 91.2 89.2 90.4  PLT 220 230 229    Cardiac Enzymes: No results for input(s): CKTOTAL, CKMB, CKMBINDEX, TROPONINI in the last 168 hours. BNP: Invalid input(s): POCBNP CBG: Recent Labs  Lab 11/29/20 2159 11/30/20 0810 11/30/20 1142 11/30/20 1636 11/30/20 2132  GLUCAP 147* 164* 128* 222* 178*   D-Dimer No results for input(s): DDIMER in the last 72 hours. Hgb A1c No results for input(s): HGBA1C in the last 72 hours. Lipid Profile No results for input(s): CHOL, HDL, LDLCALC, TRIG, CHOLHDL, LDLDIRECT in the last 72 hours. Thyroid function studies No results for input(s): TSH, T4TOTAL, T3FREE, THYROIDAB in the last 72 hours.  Invalid input(s): FREET3 Anemia work up No results for input(s): VITAMINB12, FOLATE, FERRITIN, TIBC, IRON, RETICCTPCT in the last 72 hours. Urinalysis    Component Value Date/Time   COLORURINE YELLOW 11/26/2020 1641   APPEARANCEUR HAZY (A) 11/26/2020 1641   LABSPEC 1.014 11/26/2020 1641   PHURINE 6.0 11/26/2020 1641   GLUCOSEU NEGATIVE 11/26/2020 [place] 11/26/2020 [place] 11/26/2020 1641   BILIRUBINUR negative  06/28/2020 1108   [place] 11/26/2020 1641   PROTEINUR NEGATIVE 11/26/2020 1641   UROBILINOGEN 1.0 06/28/2020 1108   NITRITE POSITIVE (A) 11/26/2020 1641   LEUKOCYTESUR NEGATIVE 11/26/2020 1641   Sepsis Labs Invalid input(s): PROCALCITONIN,  WBC,  LACTICIDVEN Microbiology Recent Results (from the past 240 hour(s))  Resp Panel by RT-PCR (Flu A&B, Covid) Nasopharyngeal Swab     Status: None   Collection Time: 11/26/20 12:49 PM   Specimen: Nasopharyngeal Swab; Nasopharyngeal(NP) swabs in vial transport medium  Result Value Ref Range Status   SARS Coronavirus 2 by RT PCR NEGATIVE NEGATIVE Final    Comment: (NOTE) SARS-CoV-2 target nucleic acids are NOT DETECTED.  The SARS-CoV-2 RNA is generally detectable in upper respiratory specimens during the acute phase of infection. The lowest concentration of SARS-CoV-2 viral copies this  assay can detect is 138 copies/mL. A negative result does not preclude SARS-Cov-2 infection and should not be used as the sole basis for treatment or other patient management decisions. A negative result may occur with  improper specimen collection/handling, submission of specimen other than nasopharyngeal swab, presence of viral mutation(s) within the areas targeted by this assay, and inadequate number of viral copies(<138 copies/mL). A negative result must be combined with clinical observations, patient history, and epidemiological information. The expected result is Negative.  Fact Sheet for Patients:  EntrepreneurPulse.com.au  Fact Sheet for Healthcare Providers:  IncredibleEmployment.be  This test is no t yet approved or cleared by the [place] FDA and  has been authorized for detection and/or diagnosis of SARS-CoV-2 by FDA under an Emergency Use Authorization (EUA). This EUA will remain  in effect (meaning this test can be used) for the duration of the COVID-19 declaration under Section 564(b)(1) of the Act, 21 U.S.C.section 360bbb-3(b)(1), unless the authorization is terminated  or revoked sooner.       Influenza A by PCR NEGATIVE NEGATIVE Final   Influenza B by PCR NEGATIVE NEGATIVE Final    Comment: (NOTE) The Xpert Xpress SARS-CoV-2/FLU/RSV plus assay is intended as an aid in the diagnosis of influenza from Nasopharyngeal swab specimens and should not be used as a sole basis for treatment. Nasal washings and aspirates are unacceptable for Xpert Xpress SARS-CoV-2/FLU/RSV testing.  Fact Sheet for Patients: EntrepreneurPulse.com.au  Fact Sheet for Healthcare Providers: IncredibleEmployment.be  This test is not yet approved or cleared by the [place] FDA and has been authorized for detection and/or diagnosis of SARS-CoV-2 by FDA under an Emergency Use Authorization (EUA). This EUA will  remain in effect (meaning this test can be used) for the duration of the COVID-19 declaration under Section 564(b)(1) of the Act, 21 U.S.C. section 360bbb-3(b)(1), unless the authorization is terminated or revoked.  Performed at [facility], [address], [place], [place] [address]   Urine culture     Status: Abnormal   Collection Time: 11/26/20  4:41 PM   Specimen: Urine, Clean Catch  Result Value Ref Range Status   Specimen Description   Final    URINE, CLEAN CATCH Performed at [facility], [address], [place], [place] [address]    Special Requests   Final    NONE Performed at [facility], [address] [place], [place] [address]    Culture >=100,000 COLONIES/mL ESCHERICHIA COLI (A)  Final   Report Status 11/29/2020 FINAL  Final   Organism ID, Bacteria ESCHERICHIA COLI (A)  Final      Susceptibility   Escherichia coli - MIC*    AMPICILLIN >=32 RESISTANT Resistant  CEFAZOLIN <=4 SENSITIVE Sensitive     CEFEPIME <=0.12 SENSITIVE Sensitive     CEFTRIAXONE <=0.25 SENSITIVE Sensitive     CIPROFLOXACIN <=0.25 SENSITIVE Sensitive     GENTAMICIN <=1 SENSITIVE Sensitive     IMIPENEM <=0.25 SENSITIVE Sensitive     NITROFURANTOIN <=16 SENSITIVE Sensitive     TRIMETH/SULFA <=20 SENSITIVE Sensitive     AMPICILLIN/SULBACTAM >=32 RESISTANT Resistant     PIP/TAZO <=4 SENSITIVE Sensitive     * >=100,000 COLONIES/mL ESCHERICHIA COLI  Culture, blood (routine x 2)     Status: None   Collection Time: 11/26/20  6:32 PM   Specimen: Right Antecubital; Blood  Result Value Ref Range Status   Specimen Description RIGHT ANTECUBITAL  Final   Special Requests   Final    Blood Culture results may not be optimal due to an inadequate volume of blood received in culture bottles BOTTLES DRAWN AEROBIC AND ANAEROBIC   Culture   Final    NO GROWTH 5 DAYS Performed at [facility], [address] [place], [place] [address]    Report Status 12/01/2020 FINAL  Final  Culture, blood (routine x 2)      Status: None   Collection Time: 11/26/20  6:42 PM   Specimen: Left Antecubital; Blood  Result Value Ref Range Status   Specimen Description LEFT ANTECUBITAL  Final   Special Requests   Final    Blood Culture results may not be optimal due to an inadequate volume of blood received in culture bottles BOTTLES DRAWN AEROBIC AND ANAEROBIC   Culture   Final    NO GROWTH 5 DAYS Performed at [facility], [address], [place], [place] [address]    Report Status 12/01/2020 FINAL  Final  Resp Panel by RT-PCR (Flu A&B, Covid) Nasopharyngeal Swab     Status: None   Collection Time: 11/30/20 11:50 AM   Specimen: Nasopharyngeal Swab; Nasopharyngeal(NP) swabs in vial transport medium  Result Value Ref Range Status   SARS Coronavirus 2 by RT PCR NEGATIVE NEGATIVE Final    Comment: (NOTE) SARS-CoV-2 target nucleic acids are NOT DETECTED.  The SARS-CoV-2 RNA is generally detectable in upper respiratory specimens during the acute phase of infection. The lowest concentration of SARS-CoV-2 viral copies this assay can detect is 138 copies/mL. A negative result does not preclude SARS-Cov-2 infection and should not be used as the sole basis for treatment or other patient management decisions. A negative result may occur with  improper specimen collection/handling, submission of specimen other than nasopharyngeal swab, presence of viral mutation(s) within the areas targeted by this assay, and inadequate number of viral copies(<138 copies/mL). A negative result must be combined with clinical observations, patient history, and epidemiological information. The expected result is Negative.  Fact Sheet for Patients:  EntrepreneurPulse.com.au  Fact Sheet for Healthcare Providers:  IncredibleEmployment.be  This test is no t yet approved or cleared by the [place] FDA and  has been authorized for detection and/or diagnosis of SARS-CoV-2 by FDA under an Emergency  Use Authorization (EUA). This EUA will remain  in effect (meaning this test can be used) for the duration of the COVID-19 declaration under Section 564(b)(1) of the Act, 21 U.S.C.section 360bbb-3(b)(1), unless the authorization is terminated  or revoked sooner.       Influenza A by PCR NEGATIVE NEGATIVE Final   Influenza B by PCR NEGATIVE NEGATIVE Final    Comment: (NOTE) The Xpert Xpress SARS-CoV-2/FLU/RSV plus assay is intended as an aid in the diagnosis of  influenza from Nasopharyngeal swab specimens and should not be used as a sole basis for treatment. Nasal washings and aspirates are unacceptable for Xpert Xpress SARS-CoV-2/FLU/RSV testing.  Fact Sheet for Patients: EntrepreneurPulse.com.au  Fact Sheet for Healthcare Providers: IncredibleEmployment.be  This test is not yet approved or cleared by the [place] FDA and has been authorized for detection and/or diagnosis of SARS-CoV-2 by FDA under an Emergency Use Authorization (EUA). This EUA will remain in effect (meaning this test can be used) for the duration of the COVID-19 declaration under Section 564(b)(1) of the Act, 21 U.S.C. section 360bbb-3(b)(1), unless the authorization is terminated or revoked.  Performed at [facility], [address] [place], [place] [address]    Time coordinating discharge: 35 minutes  SIGNED:  Kerney Elbe, [facility] 12/01/2020, 8:05 AM Pager is on [place]  If 7PM-7AM, please contact night-coverage [email or web address]

## 2020-12-01 NOTE — TOC Transition Note (Signed)
Transition of Care [facility]) - CM/SW Discharge Note   Patient Details  Name: Amy [name] MRN: [id number] Date of Birth: 05-11-1936  Transition of Care [facility] CM/SW Contact:  Iona Beard, [place] Phone Number: 12/01/2020, 1:11 PM   Clinical Narrative:    CSW spoke with April at [place] who requested CSW scan forms to her email as the fax machine was not working. CSW faxed COVID results, Chest xray results, and the discharge summary. April also request that pt be sent with hard scripts for her narcotics. CSW updated pts Attending of this and he will sign them and they will be with her discharge information. [facility] is ready to receive pt when we are ready for d/c. CSW updated pts RN and Attending of this. Med necessity form has been sent to the nurses station. CSW updated pts son that pt is ready for d/c and will be transported this afternoon, he is understanding of this. TOC signing off.    Final next level of care: Assisted Living Barriers to Discharge: Barriers Resolved   Patient Goals and CMS Choice Patient states their goals for this hospitalization and ongoing recovery are:: Go to [facility] CMS Medicare.gov Compare Post Acute Care list provided to:: Patient Represenative (must comment) Choice offered to / list presented to : [place] / [place]  Discharge Placement              Patient chooses bed at: Other - please specify in the comment section below: ([facility]) Patient to be transferred to facility by: EMS Name of family member notified: Patrici Ranks Patient and family notified of of transfer: 12/01/20  Discharge Plan and Services In-house Referral: Clinical Social Work Discharge Planning Services: CM Consult Post Acute Care Choice: [place]          DME Arranged: Hospital bed,[place] DME Agency: Other - Comment ([facility]) Date DME Agency Contacted: 11/30/20 Time DME Agency Contacted: 31   HH Arranged: NA [place]  Agency: NA        Social Determinants of Health ([place]) Interventions     Readmission Risk Interventions Readmission Risk Prevention Plan 11/27/2020 08/19/2019  Transportation Screening Complete Complete  PCP or Specialist Appt within 3-5 Days - Not Complete  HRI or Home Care Consult Complete Complete  Social Work Consult for [place] Planning/Counseling Complete Complete  Palliative Care Screening Not Applicable Not Complete  Medication Review Press photographer) Complete Complete  Some recent data might be hidden

## 2020-12-01 NOTE — Care Management Important Message (Signed)
Important Message  Patient Details  Name: [name] MRN: [id number] Date of Birth: Nov 13, 1935   Medicare Important Message Given:  Yes     Tommy Medal 12/01/2020, 11:26 AM

## 2020-12-04 NOTE — Progress Notes (Deleted)
Cardiology Office Note    Date:  12/04/2020   ID:  Amy [name], DOB 09-02-1935, MRN [id number]   PCP:  [name], [place]  Cardiologist:  [name], MD *** Advanced Practice Provider:  No care team member to display Electrophysiologist:  Thompson Grayer, MD   [phone]   No chief complaint on file.   History of Present Illness:  Amy [name] is a 85 y.o. female  with history of systolic CHF EF 35 to 30% on echo 09/2019, Myoview 10/2019 EF 48% no ischemia, PAF A. fib burden 0.3% on ILR interrogation 07/15/2020, cryptogenic stroke, dementia.  Cardiomyopathy initially diagnosed 02/2019 when she presented with a stroke.  Because of dementia invasive evaluation for ischemia has been avoided.  Myoview 10/2019 no ischemia managed medically.   Patient had telemedicine visit with Richardson Dopp, PA-C 08/01/2020 for chronic leg edema and was started on low dose Lasix 20 mg Monday Wednesday Friday.  Plan to resume losartan if blood pressure was high enough.  CHA2DS2-VASc was 8 and the concern was about her dementia living alone and history of falling.  She has a sitter 5 days a week for 5 hours.  He deferred final decision to Dr. [name] who spoke with patient's son and she had had a fall before Thanksgiving and a second fall in December.  She recommended continuing aspirin and no anticoagulation   I saw patient 10/09/2020 for preop clearance but she decided to decline surgery.  She had had several falls and we discussed possible placement for her which they were considering.  She had a lot of edema and I increased her Lasix for 3 days.  Patient was discharged 12/01/2020 with diagnosis of progressive dementia versus acute metabolic encephalopathy.  She also had a UTI.  She had behavioral disturbances and EEG showed some mild diffuse encephalopathy.  Palliative care was called and she was sent to skilled nursing facility.    Past Medical History:  Diagnosis Date  .  Anxiety   . Arthritis   . Back pain   . Colon polyps   . Dementia ([place])    early signs per daughter in law  . Depression   . Diabetes mellitus, type II ([place])    diet controlled  . Hypercholesterolemia   . Invasive ductal carcinoma of breast, female, right ([place])   . Osteopenia 11/26/2016  . Skin-picking disorder    has 3 open wounds on right wrist that are being treated. About a quarter in size.  . Stroke [facility])     Past Surgical History:  Procedure Laterality Date  . APPENDECTOMY    . BACK SURGERY    . BREAST BIOPSY Right 10/09/2016   Procedure: BREAST BIOPSY WITH NEEDLE LOCALIZATION;  Surgeon: [name], MD;  Location: [facility]S;  Service: General;  Laterality: Right;  . BREAST SURGERY Right   . BUBBLE STUDY  03/23/2019   Procedure: BUBBLE STUDY;  Surgeon: [name], MD;  Location: [facility] ENDOSCOPY;  Service: Cardiovascular;;  . BUNIONECTOMY Bilateral   . FACIAL COSMETIC SURGERY     drooping eyelid and brow  . HERNIA REPAIR Right    Inguinal x2  . implantable loop recorder placement  10/21/2019    Medtronic Reveal [place] model LNQ 11 [id number]) implanted by Dr [name] for cryptogenic stroke indication  . INGUINAL LYMPH NODE BIOPSY Left 03/10/2020   Procedure: LEFT INGUINAL LYMPH NODE BIOPSY;  Surgeon: [name], MD;  Location: [facility]S;  Service: General;  Laterality: Left;  . KNEE ARTHROSCOPY Right   . SHOULDER SURGERY Right    rotator cuff repair and removal of bone spur  . TEE WITHOUT CARDIOVERSION N/A 03/23/2019   Procedure: TRANSESOPHAGEAL ECHOCARDIOGRAM (TEE);  Surgeon: [name], MD;  Location: [facility] ENDOSCOPY;  Service: Cardiovascular;  Laterality: N/A;  . TEMPOROMANDIBULAR JOINT SURGERY Right   . TOTAL KNEE ARTHROPLASTY Right 05/05/2018   Procedure: TOTAL KNEE ARTHROPLASTY;  Surgeon: [name], MD;  Location: [facility];  Service: Orthopedics;  Laterality: Right;    Current Medications: No outpatient medications have been marked as taking for the  12/11/20 encounter (Appointment) with [name], PA-C.     Allergies:   Cymbalta [duloxetine hcl] and Duloxetine   Social History   Socioeconomic History  . Marital status: Divorced    Spouse name: Not on file  . Number of children: Not on file  . Years of education: Not on file  . Highest education level: Not on file  Occupational History  . Occupation: Nurse    Comment: Retired  Tobacco Use  . Smoking status: Former Smoker    Packs/day: 0.50    Years: 40.00    Pack years: 20.00    Types: Cigarettes    Quit date: 09/04/1995    Years since quitting: 25.2  . Smokeless tobacco: Never Used  Vaping Use  . Vaping Use: Never used  Substance and Sexual Activity  . Alcohol use: No  . Drug use: No    Types: Benzodiazepines, Hydrocodone  . Sexual activity: Never  Other Topics Concern  . Not on file  Social History Narrative   Divorced.  Former smoker, quit in 1997.  Nondrinker.    Social Determinants of Health   Financial Resource Strain: Not on file  Food Insecurity: Not on file  Transportation Needs: Not on file  Physical Activity: Not on file  Stress: Not on file  Social Connections: Not on file     Family History:  The patient's ***family history includes Alcohol abuse in her brother; Anxiety disorder in her mother; Cancer - Other in her sister; Depression in her maternal grandmother and mother.   ROS:   Please see the history of present illness.    ROS All other systems reviewed and are negative.   PHYSICAL EXAM:   VS:  There were no vitals taken for this visit.  Physical Exam  GEN: Well nourished, well developed, in no acute distress  HEENT: normal  Neck: no JVD, carotid bruits, or masses Cardiac:RRR; no murmurs, rubs, or gallops  Respiratory:  clear to auscultation bilaterally, normal work of breathing GI: soft, nontender, nondistended, + BS Ext: without cyanosis, clubbing, or edema, Good distal pulses bilaterally MS: no deformity or atrophy  Skin:  warm and dry, no rash Neuro:  Alert and Oriented x 3, Strength and sensation are intact Psych: euthymic mood, full affect  Wt Readings from Last 3 Encounters:  11/26/20 160 lb 15 oz (73 kg)  10/09/20 161 lb (73 kg)  08/03/20 157 lb 11.2 oz (71.5 kg)      Studies/Labs Reviewed:   EKG:  EKG is*** ordered today.  The ekg ordered today demonstrates ***  Recent Labs: 08/21/2020: NT-Pro BNP 120 10/09/2020: B Natriuretic Peptide 31.0 11/28/2020: TSH 2.074 11/29/2020: Hemoglobin 13.5; Platelets 229 11/30/2020: ALT 19; Magnesium 2.2 12/01/2020: BUN 21; Creatinine, Ser 0.76; Potassium 4.6; Sodium 138   Lipid Panel    Component Value Date/Time   CHOL 208 (H) 07/11/2020  0401   CHOL 172 05/06/2019 1451   TRIG 120 07/11/2020 0401   HDL 74 07/11/2020 0401   HDL 83 05/06/2019 1451   CHOLHDL 2.8 07/11/2020 0401   VLDL 24 07/11/2020 0401   LDLCALC 110 (H) 07/11/2020 0401   LDLCALC 57 05/06/2019 1451    Additional studies/ records that were reviewed today include:  NST 09/27/19 EF 48, apical thinning, no ischemia, low risk   Echocardiogram 09/27/19 EF 35-40, global HK, Gr 1 DD, normal RVSF, mild MR, trivial AI   Event monitor 06/2019 SInus rhythm, sinus tachycardia   Rates 61 to 131 bpm    Occasional PVC Symptoms of fatigue did not correlate with any signif arrhythmia or extreme heart rates   TEE 03/23/2019 EF 30-35, no RWMA, small effusion, small Patent Foramen Ovale,    Echocardiogram 03/21/2019 EF 35-40          Risk Assessment/Calculations:   {Does this patient have ATRIAL FIBRILLATION?[phone]}     ASSESSMENT:    No diagnosis found.   PLAN:  In order of problems listed above:   PAF CHA2DS2-VASc equals 8 but not on anticoagulation due to frequent falls and dementia. LINQ report past 2 months have showed NSR with PAC's.    Cardiomyopathy diagnosed 02/2020 presenting for a stroke.   Because of dementia no invasive evaluation.  NST no ischemia 10/2019.  Most recent echo  09/2019 LVEF 35 to 40% started on low-dose Lasix last office visit. Still has a lot of swelling R>L. Will increase Lasix to 40 mg daily for 3 days then back to 20 mg 5 days a week when she has a sitter with her.   Acute on chronic systolic and diastolic CHF-see above. Will also check labs and 2 gm sodium diet   Cryptogenic stroke   Progressive dementia   Shared Decision Making/Informed Consent   {Are you ordering a CV Procedure (e.g. stress test, cath, DCCV, TEE, etc)?   Press F2        :[id number]}    Medication Adjustments/Labs and Tests Ordered: Current medicines are reviewed at length with the patient today.  Concerns regarding medicines are outlined above.  Medication changes, Labs and Tests ordered today are listed in the Patient Instructions below. There are no Patient Instructions on file for this visit.   Sumner Boast, PA-C  12/04/2020 3:33 PM    [facility] [place], [place], [place]  [address] Phone: [phone]; Fax: [phone]

## 2020-12-05 NOTE — Progress Notes (Signed)
[facility] Summary Report / Loop Recorder 

## 2020-12-11 ENCOUNTER — Ambulatory Visit: Payer: Medicare HMO | Admitting: Physician Assistant

## 2020-12-11 DIAGNOSIS — I5022 Chronic systolic (congestive) heart failure: Secondary | ICD-10-CM

## 2020-12-11 DIAGNOSIS — I480 Paroxysmal atrial fibrillation: Secondary | ICD-10-CM

## 2020-12-11 DIAGNOSIS — I428 Other cardiomyopathies: Secondary | ICD-10-CM

## 2020-12-11 DIAGNOSIS — I639 Cerebral infarction, unspecified: Secondary | ICD-10-CM

## 2020-12-12 ENCOUNTER — Ambulatory Visit (HOSPITAL_COMMUNITY)
Admission: RE | Admit: 2020-12-12 | Discharge: 2020-12-12 | Disposition: A | Discharge status: Home / Self Care | Payer: Medicare HMO | Source: Ambulatory Visit | Attending: Hematology | Admitting: Hematology

## 2020-12-12 ENCOUNTER — Other Ambulatory Visit: Payer: Self-pay

## 2020-12-12 DIAGNOSIS — C50911 Malignant neoplasm of unspecified site of right female breast: Secondary | ICD-10-CM

## 2020-12-12 DIAGNOSIS — Z9011 Acquired absence of right breast and nipple: Secondary | ICD-10-CM | POA: Diagnosis not present

## 2020-12-12 DIAGNOSIS — Z853 Personal history of malignant neoplasm of breast: Secondary | ICD-10-CM | POA: Insufficient documentation

## 2020-12-14 ENCOUNTER — Other Ambulatory Visit: Payer: Self-pay

## 2020-12-14 ENCOUNTER — Telehealth (INDEPENDENT_AMBULATORY_CARE_PROVIDER_SITE_OTHER): Payer: Medicare HMO | Admitting: Psychiatry

## 2020-12-14 ENCOUNTER — Encounter (HOSPITAL_COMMUNITY): Payer: Self-pay | Admitting: Psychiatry

## 2020-12-14 DIAGNOSIS — F330 Major depressive disorder, recurrent, mild: Secondary | ICD-10-CM

## 2020-12-14 MED ORDER — RISPERIDONE 1 MG PO TABS: 1.0000 mg | ORAL_TABLET | Freq: Every day | ORAL | 2 refills | Status: DC

## 2020-12-14 MED ORDER — ESCITALOPRAM OXALATE 20 MG PO TABS: 20.0000 mg | ORAL_TABLET | Freq: Every day | ORAL | 2 refills | Status: DC

## 2020-12-14 MED ORDER — DONEPEZIL HCL 5 MG PO TABS: 5.0000 mg | ORAL_TABLET | Freq: Every day | ORAL | 2 refills | Status: DC

## 2020-12-14 MED ORDER — ALPRAZOLAM 0.5 MG PO TABS: 0.5000 mg | ORAL_TABLET | Freq: Every day | ORAL | 2 refills | Status: DC

## 2020-12-14 MED ORDER — QUETIAPINE FUMARATE 25 MG PO TABS: 25.0000 mg | ORAL_TABLET | Freq: Two times a day (BID) | ORAL | 2 refills | Status: DC

## 2020-12-14 NOTE — Progress Notes (Signed)
Virtual Visit via Telephone Note  I connected with Amy [name] on 12/14/20 at 11:20 AM EDT by telephone and verified that I am speaking with the correct person using two identifiers.  Location: Patient: nursing home Provider: office   I discussed the limitations, risks, security and privacy concerns of performing an evaluation and management service by telephone and the availability of in person appointments. I also discussed with the patient that there may be a patient responsible charge related to this service. The patient expressed understanding and agreed to proceed    I discussed the assessment and treatment plan with the patient. The patient was provided an opportunity to ask questions and all were answered. The patient agreed with the plan and demonstrated an understanding of the instructions.   The patient was advised to call back or seek an in-person evaluation if the symptoms worsen or if the condition fails to improve as anticipated.  I provided 15 minutes of non-face-to-face time during this encounter.   Levonne Spiller, MD  [facility] MD/PA/NP OP Progress Note  12/14/2020 11:47 AM Amy [name]  MRN:  [id number]  Chief Complaint: depression, confusion HPI: This patient is an 85 year old divorced white female who has been living alone in [place].  However since 12/01/2020 she has been living in an assisted living facility, [facility] in [place]  The patient was last seen on 09/21/2020.  Since then her son had called me several times reported that she had become increasingly confused.  He was worried about her living alone.  I talked to him at length about trying to gain power of attorney for medical needs as well as have her deemed incompetent so he could get her placed somewhere.  However on 11/26/2020 she had become acutely confused and wandering she was brought to the hospital and was disoriented and found to have a UTI.  Because of her confusion and wandering  behavior she was placed in an assisted living facility.  Seroquel was also added to her regimen during the day and she is taking respite all at night.  Today I spoke to her nurse at the facility as well as to the patient.  The nurse states that she is still getting used to the new living situation.  In general she is eating and sleeping well.  She is still somewhat confused.  The patient told me that she is living with her parents at home even though she is 65 years old.  She is oriented to person and month only.  However she denies being depressed or suicidal.  She is not significantly anxious.  The nurse does not report any agitation or difficult behaviors.. Visit Diagnosis:    ICD-10-CM   1. Major depressive disorder, recurrent episode, mild with anxious distress (HCC)  F33.0 ALPRAZolam (XANAX) 0.5 MG tablet    Past Psychiatric History: Psychiatric hospitalization in her 57s, since then outpatient treatment  Past Medical History:  Past Medical History:  Diagnosis Date  . Anxiety   . Arthritis   . Back pain   . Colon polyps   . Dementia ([place])    early signs per daughter in law  . Depression   . Diabetes mellitus, type II ([place])    diet controlled  . Hypercholesterolemia   . Invasive ductal carcinoma of breast, female, right ([place])   . Osteopenia 11/26/2016  . Skin-picking disorder    has 3 open wounds on right wrist that are being treated. About a quarter in size.  Marland Kitchen  Stroke [facility])     Past Surgical History:  Procedure Laterality Date  . APPENDECTOMY    . BACK SURGERY    . BREAST BIOPSY Right 10/09/2016   Procedure: BREAST BIOPSY WITH NEEDLE LOCALIZATION;  Surgeon: [name], MD;  Location: [facility]S;  Service: General;  Laterality: Right;  . BREAST SURGERY Right   . BUBBLE STUDY  03/23/2019   Procedure: BUBBLE STUDY;  Surgeon: [name], MD;  Location: [facility] ENDOSCOPY;  Service: Cardiovascular;;  . BUNIONECTOMY Bilateral   . FACIAL COSMETIC SURGERY     drooping eyelid and  brow  . HERNIA REPAIR Right    Inguinal x2  . implantable loop recorder placement  10/21/2019    Medtronic Reveal [place] model [id number]) implanted by Dr [name] for cryptogenic stroke indication  . INGUINAL LYMPH NODE BIOPSY Left 03/10/2020   Procedure: LEFT INGUINAL LYMPH NODE BIOPSY;  Surgeon: [name], MD;  Location: [facility];  Service: General;  Laterality: Left;  . KNEE ARTHROSCOPY Right   . SHOULDER SURGERY Right    rotator cuff repair and removal of bone spur  . TEE WITHOUT CARDIOVERSION N/A 03/23/2019   Procedure: TRANSESOPHAGEAL ECHOCARDIOGRAM (TEE);  Surgeon: [name], MD;  Location: [facility] ENDOSCOPY;  Service: Cardiovascular;  Laterality: N/A;  . TEMPOROMANDIBULAR JOINT SURGERY Right   . TOTAL KNEE ARTHROPLASTY Right 05/05/2018   Procedure: TOTAL KNEE ARTHROPLASTY;  Surgeon: [name], MD;  Location: [facility];  Service: Orthopedics;  Laterality: Right;    Family Psychiatric History: see below  Family History:  Family History  Problem Relation Age of Onset  . Anxiety disorder Mother   . Depression Mother   . Depression Maternal Grandmother   . Alcohol abuse Brother   . Cancer - Other Sister   . ADD / ADHD Neg Hx   . Bipolar disorder Neg Hx   . Dementia Neg Hx   . Drug abuse Neg Hx   . OCD Neg Hx   . Paranoid behavior Neg Hx   . Schizophrenia Neg Hx   . Seizures Neg Hx   . Sexual abuse Neg Hx   . Physical abuse Neg Hx     Social History:  Social History   Socioeconomic History  . Marital status: Divorced    Spouse name: Not on file  . Number of children: Not on file  . Years of education: Not on file  . Highest education level: Not on file  Occupational History  . Occupation: Nurse    Comment: Retired  Tobacco Use  . Smoking status: Former Smoker    Packs/day: 0.50    Years: 40.00    Pack years: 20.00    Types: Cigarettes    Quit date: 09/04/1995    Years since quitting: 25.2  . Smokeless tobacco: Never Used  Vaping Use  .  Vaping Use: Never used  Substance and Sexual Activity  . Alcohol use: No  . Drug use: No    Types: Benzodiazepines, Hydrocodone  . Sexual activity: Never  Other Topics Concern  . Not on file  Social History Narrative   Divorced.  Former smoker, quit in 1997.  Nondrinker.    Social Determinants of Health   Financial Resource Strain: Not on file  Food Insecurity: Not on file  Transportation Needs: Not on file  Physical Activity: Not on file  Stress: Not on file  Social Connections: Not on file    Allergies:  Allergies  Allergen Reactions  . Cymbalta [Duloxetine  Hcl] Other (See Comments)    Hallucinated and couldn't think; got worse and worse the longer she took this.  . Duloxetine     Metabolic Disorder Labs: Lab Results  Component Value Date   HGBA1C 6.2 (H) 11/28/2020   MPG 131.24 11/28/2020   MPG 128.37 07/10/2020   No results found for: PROLACTIN Lab Results  Component Value Date   CHOL 208 (H) 07/11/2020   TRIG 120 07/11/2020   HDL 74 07/11/2020   CHOLHDL 2.8 07/11/2020   VLDL 24 07/11/2020   LDLCALC 110 (H) 07/11/2020   LDLCALC 57 05/06/2019   Lab Results  Component Value Date   TSH 2.074 11/28/2020   TSH 1.238 10/07/2019    Therapeutic Level Labs: No results found for: LITHIUM No results found for: VALPROATE No components found for:  CBMZ  Current Medications: Current Outpatient Medications  Medication Sig Dispense Refill  . acetaminophen (TYLENOL) 325 MG tablet Take 2 tablets (650 mg total) by mouth every 6 (six) hours as needed for mild pain (or Fever >/= 101).    Marland Kitchen ALPRAZolam (XANAX) 0.5 MG tablet Take 1 tablet (0.5 mg total) by mouth at bedtime. 30 tablet 2  . aspirin EC 81 MG tablet Take 81 mg by mouth every morning.    . calcium carbonate (OSCAL) 1500 (600 Ca) MG TABS tablet Take 600 mg of elemental calcium by mouth daily with supper.     . cholecalciferol (VITAMIN D) 1000 units tablet Take 1 tablet (1,000 Units total) by mouth daily. 30  tablet 11  . donepezil (ARICEPT) 5 MG tablet Take 1 tablet (5 mg total) by mouth daily. 30 tablet 2  . escitalopram (LEXAPRO) 20 MG tablet Take 1 tablet (20 mg total) by mouth daily. 30 tablet 2  . feeding supplement (ENSURE ENLIVE / ENSURE PLUS) LIQD Take 237 mLs by mouth 2 (two) times daily between meals. 237 mL 12  . gabapentin (NEURONTIN) 100 MG capsule Take 200 mg by mouth at bedtime.    Marland Kitchen letrozole (FEMARA) 2.5 MG tablet TAKE 1 TABLET(2.5 MG) BY MOUTH DAILY 90 tablet 3  . metFORMIN (GLUCOPHAGE) 500 MG tablet Take 1 tablet (500 mg total) by mouth 2 (two) times daily with a meal. 60 tablet 0  . metoprolol succinate (TOPROL-XL) 25 MG 24 hr tablet Take 1 tablet (25 mg total) by mouth daily. Please keep upcoming appt in January 2022 before anymore refills. Thank you 30 tablet 1  . Multiple Vitamin (MULTIVITAMIN WITH MINERALS) TABS tablet Take 1 tablet by mouth daily.    . ondansetron (ZOFRAN) 4 MG tablet Take 1 tablet (4 mg total) by mouth every 6 (six) hours as needed for nausea. 20 tablet 0  . oxybutynin (DITROPAN) 5 MG tablet Take 5 mg by mouth at bedtime.    . polyethylene glycol (MIRALAX / GLYCOLAX) 17 g packet Take 17 g by mouth daily as needed for mild constipation. 14 each 0  . polyvinyl alcohol (LIQUIFILM TEARS) 1.4 % ophthalmic solution Place 1 drop into both eyes as needed for dry eyes. 15 mL 0  . QUEtiapine (SEROQUEL) 25 MG tablet Take 1 tablet (25 mg total) by mouth 2 (two) times daily. 60 tablet 2  . risperiDONE (RISPERDAL) 1 MG tablet Take 1 tablet (1 mg total) by mouth at bedtime. 30 tablet 2   No current facility-administered medications for this visit.     Musculoskeletal: Strength & Muscle Tone: decreased Gait & Station: unsteady Patient leans: N/A  Psychiatric Specialty Exam: Review of  Systems  Musculoskeletal: Positive for arthralgias, back pain and gait problem.  Psychiatric/Behavioral: Positive for confusion.  All other systems reviewed and are negative.   There  were no vitals taken for this visit.There is no height or weight on file to calculate BMI.  General Appearance: NA  Eye Contact:  NA  Speech:  Clear and Coherent  Volume:  Normal  Mood:  Anxious  Affect:  NA  Thought Process:  Disorganized  Orientation:  Other:  person and month only  Thought Content: Illogical and Tangential   Suicidal Thoughts:  No  Homicidal Thoughts:  No  Memory:  Immediate;   Fair Recent;   Poor Remote;   Poor  Judgement:  Impaired  Insight:  Lacking  Psychomotor Activity:  Decreased  Concentration:  Concentration: Fair and Attention Span: Fair  Recall:  Poor  Fund of Knowledge: Fair  Language: Good  Akathisia:  No  Handed:  Right  AIMS (if indicated): not done  Assets:  Communication Skills Resilience Social Support  ADL's:  Intact  Cognition: Impaired,  Mild  Sleep:  Good   Screenings: MDI   Flowsheet Row Office Visit from 02/15/2016 in [facility]-  Total Score (max 50) 17    Mini-Mental   Flowsheet Row Office Visit from 11/22/2019 in [facility] [place] Visit from 09/06/2019 in [facility]  Total Score (max 30 points ) 21 (P)  22    Flowsheet Row ED to Hosp-Admission (Discharged) from 11/26/2020 in [facility] No Risk       Assessment and Plan: This patient is an 85 year old female with a history of anxiety and depression and now diagnosed with mild dementia and confusion.  She seems fairly stable on her current regimen.  She will continue Xanax 0.5 mg at bedtime for confusion and anxiety, Aricept 5 mg daily for slowing progression of dementia, Lexapro 20 mg daily for depression, Seroquel 25 mg twice daily for agitation and Risperdal 1 mg at bedtime to help with sleep and agitation.  She will return to see me in 2 months   Levonne Spiller, MD 12/14/2020, 11:48 AM

## 2020-12-18 ENCOUNTER — Ambulatory Visit (INDEPENDENT_AMBULATORY_CARE_PROVIDER_SITE_OTHER): Payer: Medicare HMO

## 2020-12-18 DIAGNOSIS — I639 Cerebral infarction, unspecified: Secondary | ICD-10-CM | POA: Diagnosis not present

## 2020-12-20 LAB — CUP PACEART REMOTE DEVICE CHECK
Date Time Interrogation Session: 20220523232337
Implantable Pulse Generator Implant Date: 20210325

## 2021-01-08 NOTE — Progress Notes (Signed)
[facility] Summary Report / Loop Recorder 

## 2021-01-18 ENCOUNTER — Ambulatory Visit (INDEPENDENT_AMBULATORY_CARE_PROVIDER_SITE_OTHER): Payer: Medicare HMO

## 2021-01-18 DIAGNOSIS — I639 Cerebral infarction, unspecified: Secondary | ICD-10-CM | POA: Diagnosis not present

## 2021-01-22 LAB — CUP PACEART REMOTE DEVICE CHECK
Date Time Interrogation Session: 20220625233113
Implantable Pulse Generator Implant Date: 20210325

## 2021-02-01 ENCOUNTER — Ambulatory Visit (INDEPENDENT_AMBULATORY_CARE_PROVIDER_SITE_OTHER): Payer: Medicare HMO | Admitting: Student

## 2021-02-01 ENCOUNTER — Encounter: Payer: Self-pay | Admitting: Student

## 2021-02-01 ENCOUNTER — Other Ambulatory Visit: Payer: Self-pay

## 2021-02-01 VITALS — BP 114/76 | HR 70 | Wt 161.0 lb

## 2021-02-01 DIAGNOSIS — I5022 Chronic systolic (congestive) heart failure: Secondary | ICD-10-CM | POA: Diagnosis not present

## 2021-02-01 DIAGNOSIS — R4189 Other symptoms and signs involving cognitive functions and awareness: Secondary | ICD-10-CM

## 2021-02-01 DIAGNOSIS — I480 Paroxysmal atrial fibrillation: Secondary | ICD-10-CM | POA: Diagnosis not present

## 2021-02-01 DIAGNOSIS — E1169 Type 2 diabetes mellitus with other specified complication: Secondary | ICD-10-CM | POA: Diagnosis not present

## 2021-02-01 NOTE — Progress Notes (Signed)
Cardiology Office Note    Date:  02/01/2021   ID:  Amy [name], DOB 1936-01-08, MRN [id number]  PCP:  Amy Gravel, MD  Cardiologist: Amy Carnes, MD   EP: Dr. Rayann [name]  Chief Complaint  Patient presents with   Follow-up    3 month visit    History of Present Illness:    Amy [name] is a 85 y.o. female with past medical history of HFrEF (EF 35-40% by echo in 09/2019 with NST in 10/2019 showing no ischemia), paroxysmal atrial fibrillation (not on anticoagulation due to frequent falls), HLD, Type 2 DM, dementia and history of breast cancer who presents to the office today for 53-month follow-up.   She was last examined by Amy Barrios, PA-C in 09/2020 for preoperative cardiac clearance for an upcoming spinal surgery. Her son reported they had decided against surgery and she had unfortunately been experiencing more falls. She had not been on anticoagulation due to frequent falls. She did appear volume overloaded on examination and her Lasix was increased to 40mg  daily for 3 days then back to 20mg  daily (5 days per week when sitter present).   She was admitted to [facility] in 11/2020 for worsening memory issues and Head CT showed no acute findings. She did have a UTI and this was appropriately treated with antibiotic therapy. Palliative Care did follow during admission and she was discharged to SNF with outpatient Palliative Services.   In talking with the patient and her son today, she reports overall doing well since her hospitalization. She is now residing at [facility] in [place], [place]. Most history is provided by her son and she did have a recurrent fall earlier this week when getting out of the bed. She was evaluated at the facility and did not require transport. She reports her breathing has been stable and denies any specific dyspnea on exertion, orthopnea or PND. No recent chest pain, palpitations or lower extremity edema. She does report chronic pain along the right side of her  back and this goes down into her right leg. Her son reports this has been occurring for several years and she was not felt to be a surgical candidate.  Past Medical History:  Diagnosis Date   Anxiety    Arthritis    Back pain    CHF (congestive heart failure) (HCC)    a. EF 35-40% by echo in 09/2019 with NST in 10/2019 showing no ischemia   Colon polyps    Dementia ([name])    early signs per daughter in law   Depression    Diabetes mellitus, type II ([place])    diet controlled   Hypercholesterolemia    Invasive ductal carcinoma of breast, female, right ([name])    Osteopenia 11/26/2016   Skin-picking disorder    has 3 open wounds on right wrist that are being treated. About a quarter in size.   Stroke [facility])     Past Surgical History:  Procedure Laterality Date   APPENDECTOMY     BACK SURGERY     BREAST BIOPSY Right 10/09/2016   Procedure: BREAST BIOPSY WITH NEEDLE LOCALIZATION;  Surgeon: [name], MD;  Location: [facility] ORS;  Service: General;  Laterality: Right;   BREAST SURGERY Right    BUBBLE STUDY  03/23/2019   Procedure: BUBBLE STUDY;  Surgeon: [name], MD;  Location: [facility] ENDOSCOPY;  Service: Cardiovascular;;   BUNIONECTOMY Bilateral    FACIAL COSMETIC SURGERY     drooping eyelid and brow  HERNIA REPAIR Right    Inguinal x2   implantable loop recorder placement  10/21/2019    Medtronic Reveal [place] model LNQ 11 [id number]) implanted by Dr Amy [name] for cryptogenic stroke indication   INGUINAL LYMPH NODE BIOPSY Left 03/10/2020   Procedure: LEFT INGUINAL LYMPH NODE BIOPSY;  Surgeon: [name], MD;  Location: [facility] ORS;  Service: General;  Laterality: Left;   KNEE ARTHROSCOPY Right    SHOULDER SURGERY Right    rotator cuff repair and removal of bone spur   TEE WITHOUT CARDIOVERSION N/A 03/23/2019   Procedure: TRANSESOPHAGEAL ECHOCARDIOGRAM (TEE);  Surgeon: [name], MD;  Location: [facility] ENDOSCOPY;  Service: Cardiovascular;  Laterality: N/A;   TEMPOROMANDIBULAR  JOINT SURGERY Right    TOTAL KNEE ARTHROPLASTY Right 05/05/2018   Procedure: TOTAL KNEE ARTHROPLASTY;  Surgeon: [name], MD;  Location: [facility] ORS;  Service: Orthopedics;  Laterality: Right;    Current Medications: Outpatient Medications Prior to Visit  Medication Sig Dispense Refill   acetaminophen (TYLENOL) 325 MG tablet Take 2 tablets (650 mg total) by mouth every 6 (six) hours as needed for mild pain (or Fever >/= 101).     ALPRAZolam (XANAX) 0.5 MG tablet Take 1 tablet (0.5 mg total) by mouth at bedtime. 30 tablet 2   aspirin EC 81 MG tablet Take 81 mg by mouth every morning.     calcium carbonate (OSCAL) 1500 (600 Ca) MG TABS tablet Take 600 mg of elemental calcium by mouth daily with supper.      cholecalciferol (VITAMIN D) 1000 units tablet Take 1 tablet (1,000 Units total) by mouth daily. 30 tablet 11   donepezil (ARICEPT) 5 MG tablet Take 1 tablet (5 mg total) by mouth daily. 30 tablet 2   escitalopram (LEXAPRO) 20 MG tablet Take 1 tablet (20 mg total) by mouth daily. 30 tablet 2   feeding supplement (ENSURE ENLIVE / ENSURE PLUS) LIQD Take 237 mLs by mouth 2 (two) times daily between meals. 237 mL 12   gabapentin (NEURONTIN) 100 MG capsule Take 200 mg by mouth at bedtime.     letrozole (FEMARA) 2.5 MG tablet TAKE 1 TABLET(2.5 MG) BY MOUTH DAILY 90 tablet 3   metFORMIN (GLUCOPHAGE) 500 MG tablet Take 1 tablet (500 mg total) by mouth 2 (two) times daily with a meal. 60 tablet 0   metoprolol succinate (TOPROL-XL) 25 MG 24 hr tablet Take 1 tablet (25 mg total) by mouth daily. Please keep upcoming appt in January 2022 before anymore refills. Thank you 30 tablet 1   Multiple Vitamin (MULTIVITAMIN WITH MINERALS) TABS tablet Take 1 tablet by mouth daily.     ondansetron (ZOFRAN) 4 MG tablet Take 1 tablet (4 mg total) by mouth every 6 (six) hours as needed for nausea. 20 tablet 0   polyvinyl alcohol (LIQUIFILM TEARS) 1.4 % ophthalmic solution Place 1 drop into both eyes as needed for  dry eyes. 15 mL 0   QUEtiapine (SEROQUEL) 25 MG tablet Take 1 tablet (25 mg total) by mouth 2 (two) times daily. 60 tablet 2   risperiDONE (RISPERDAL) 1 MG tablet Take 1 tablet (1 mg total) by mouth at bedtime. 30 tablet 2   oxybutynin (DITROPAN) 5 MG tablet Take 5 mg by mouth at bedtime. (Patient not taking: Reported on 02/01/2021)     polyethylene glycol (MIRALAX / GLYCOLAX) 17 g packet Take 17 g by mouth daily as needed for mild constipation. (Patient not taking: Reported on 02/01/2021) 14 each 0   No facility-administered medications  prior to visit.     Allergies:   Cymbalta [duloxetine hcl] and Duloxetine   Social History   Socioeconomic History   Marital status: Divorced    Spouse name: Not on file   Number of children: Not on file   Years of education: Not on file   Highest education level: Not on file  Occupational History   Occupation: Nurse    Comment: Retired  Tobacco Use   Smoking status: Former    Packs/day: 0.50    Years: 40.00    Pack years: 20.00    Types: Cigarettes    Quit date: 09/04/1995    Years since quitting: 25.4   Smokeless tobacco: Never  Vaping Use   Vaping Use: Never used  Substance and Sexual Activity   Alcohol use: No   Drug use: No    Types: Benzodiazepines, Hydrocodone   Sexual activity: Never  Other Topics Concern   Not on file  Social History Narrative   Divorced.  Former smoker, quit in 1997.  Nondrinker.    Social Determinants of Health   Financial Resource Strain: Not on file  Food Insecurity: Not on file  Transportation Needs: Not on file  Physical Activity: Not on file  Stress: Not on file  Social Connections: Not on file     Family History:  The patient's family history includes Alcohol abuse in her brother; Anxiety disorder in her mother; Cancer - Other in her sister; Depression in her maternal grandmother and mother.   Review of Systems:    Please see the history of present illness.     All other systems reviewed and are  otherwise negative except as noted above.   Physical Exam:    VS:  BP 114/76   Pulse 70   Wt 161 lb (73 kg)   SpO2 94%   BMI 27.64 kg/m    General: Pleasant elderly female appearing in no acute distress. Head: Normocephalic, atraumatic. Neck: No carotid bruits. JVD not elevated.  Lungs: Respirations regular and unlabored, without wheezes or rales.  Heart: Regular rate and rhythm. No S3 or S4.  No murmur, no rubs, or gallops appreciated. Abdomen: Appears non-distended. No obvious abdominal masses. Msk:  Strength and tone appear normal for age. No obvious joint deformities or effusions. Extremities: No clubbing or cyanosis. No pitting edema.  Distal pedal pulses are 2+ bilaterally. Neuro: Alert and oriented X 2 (person, place). Moves all extremities spontaneously. No focal deficits noted. Psych:  Responds to questions appropriately with a normal affect. Skin: No rashes or lesions noted  Wt Readings from Last 3 Encounters:  02/01/21 161 lb (73 kg)  11/26/20 160 lb 15 oz (73 kg)  10/09/20 161 lb (73 kg)     Studies/Labs Reviewed:   EKG:  EKG is not ordered today.    Recent Labs: 08/21/2020: NT-Pro BNP 120 10/09/2020: B Natriuretic Peptide 31.0 11/28/2020: TSH 2.074 11/29/2020: Hemoglobin 13.5; Platelets 229 11/30/2020: ALT 19; Magnesium 2.2 12/01/2020: BUN 21; Creatinine, Ser 0.76; Potassium 4.6; Sodium 138   Lipid Panel    Component Value Date/Time   CHOL 208 (H) 07/11/2020 0401   CHOL 172 05/06/2019 1451   TRIG 120 07/11/2020 0401   HDL 74 07/11/2020 0401   HDL 83 05/06/2019 1451   CHOLHDL 2.8 07/11/2020 0401   VLDL 24 07/11/2020 0401   LDLCALC 110 (H) 07/11/2020 0401   LDLCALC 57 05/06/2019 1451    Additional studies/ records that were reviewed today include:   Echocardiogram: 09/2019 IMPRESSIONS  1. Left ventricular ejection fraction, by estimation, is 35 to 40%. The  left ventricle has moderately decreased function. The left ventricle  demonstrates global  hypokinesis. Left ventricular diastolic parameters are  consistent with Grade I diastolic  dysfunction (impaired relaxation).   2. Right ventricular systolic function is normal. The right ventricular  size is normal. Tricuspid regurgitation signal is inadequate for assessing  PA pressure.   3. The mitral valve is grossly normal with mild leaflet thickening. Mild  mitral valve regurgitation.   4. The aortic valve is tricuspid. Aortic valve regurgitation is trivial.   5. The inferior vena cava is normal in size with greater than 50%  respiratory variability, suggesting right atrial pressure of 3 mmHg.   6. Interatrial shunting not evident baed on limited images, although PFO  previously reported by [facility]EE August 2020.   NST: 10/2019 The left ventricular ejection fraction is mildly decreased (45-54%). Nuclear stress EF: 48%. There was no ST segment deviation noted during stress. This is a low risk study.   Apical thinning not thought to be significant No ischemia EF estimated at 48% with apical hypokinesis suggest MRI/Echo correlation of EF  Assessment:    1. Chronic systolic heart failure (HCC)   2. Paroxysmal atrial fibrillation ([place])   3. Type 2 diabetes mellitus with other specified complication, without long-term current use of insulin ([place])   4. Cognitive impairment      Plan:   In order of problems listed above:  1. HFrEF - Most recent echocardiogram in 09/2019 showed a reduced EF of 35-40% and NST showed no ischemia. Further evaluation was not pursued in the setting of her dementia.  - She denies any recent dyspnea on exertion, orthopnea, PND or lower extremity edema. Does not appear volume overloaded by examination today and she has not been taking Lasix since at SNF. I did recommend that they obtain at minimum a weekly weight on her and I encouraged her son to report back if he notices that she is retaining fluid when he visits as an Rx could be provided for PRN Lasix. Will  continue Toprol-XL 25 mg daily. BP has not allowed for titration of medical therapy and an overall conservative approach has been pursued given her dementia.  2. Paroxysmal Atrial Fibrillation - Noted on her ILR and she has not been on anticoagulation due to frequent falls. Most recent interrogation last month showed no new AF episodes. She is still experiencing frequent falls, even at [facility]. Not an ideal candidate for anticoagulation at this time and family is aware. She remains on ASA 81 mg daily. Continue Toprol-XL 25 mg daily for rate control.  3. Type 2 DM - Hgb A1c was at 6.2 in 11/2020. Followed by her PCP. She remains on Metformin 500 mg twice daily.  4. Dementia - She is now residing in a memory care unit in [place], [place].    Medication Adjustments/Labs and Tests Ordered: Current medicines are reviewed at length with the patient today.  Concerns regarding medicines are outlined above.  Medication changes, Labs and Tests ordered today are listed in the Patient Instructions below. Patient Instructions  Medication Instructions:  Your physician recommends that you continue on your current medications as directed. Please refer to the Current Medication list given to you today.   *If you need a refill on your cardiac medications before your next appointment, please call your pharmacy*   Lab Work: None today  If you have labs (blood work) drawn today and your  tests are completely normal, you will receive your results only by: MyChart Message (if you have MyChart) OR A paper copy in the mail If you have any lab test that is abnormal or we need to change your treatment, we will call you to review the results.   Testing/Procedures: None today    Follow-Up: At [facility], you and your health needs are our priority.  As part of our continuing mission to provide you with exceptional heart care, we have created designated Provider Care Teams.  These Care Teams include your primary  Cardiologist (physician) and Advanced Practice Providers (APPs -  Physician Assistants and Nurse Practitioners) who all work together to provide you with the care you need, when you need it.  We recommend signing up for the patient portal called "MyChart".  Sign up information is provided on this After Visit Summary.  MyChart is used to connect with patients for Virtual Visits (Telemedicine).  Patients are able to view lab/test results, encounter notes, upcoming appointments, etc.  Non-urgent messages can be sent to your provider as well.   To learn more about what you can do with MyChart, go to [email or web address].    Your next appointment:   6 month(s)  The format for your next appointment:   In Person  Provider:   [name], MD   Other Instructions Please obtain weekly weights    Thank you for choosing [facility] !         Signed, Erma Heritage, PA-C  02/01/2021 5:48 PM    [facility] [address] [place], [place] [address] Phone: [phone] Fax: [phone]

## 2021-02-01 NOTE — Patient Instructions (Signed)
Medication Instructions:  Your physician recommends that you continue on your current medications as directed. Please refer to the Current Medication list given to you today.   *If you need a refill on your cardiac medications before your next appointment, please call your pharmacy*   Lab Work: None today  If you have labs (blood work) drawn today and your tests are completely normal, you will receive your results only by: [place] (if you have MyChart) OR A paper copy in the mail If you have any lab test that is abnormal or we need to change your treatment, we will call you to review the results.   Testing/Procedures: None today    Follow-Up: At [facility], you and your health needs are our priority.  As part of our continuing mission to provide you with exceptional heart care, we have created designated Provider Care Teams.  These Care Teams include your primary Cardiologist (physician) and Advanced Practice Providers (APPs -  Physician Assistants and Nurse Practitioners) who all work together to provide you with the care you need, when you need it.  We recommend signing up for the patient portal called "MyChart".  Sign up information is provided on this After Visit Summary.  MyChart is used to connect with patients for Virtual Visits (Telemedicine).  Patients are able to view lab/test results, encounter notes, upcoming appointments, etc.  Non-urgent messages can be sent to your provider as well.   To learn more about what you can do with MyChart, go to [email or web address].    Your next appointment:   6 month(s)  The format for your next appointment:   In Person  Provider:   [name], MD   Other Instructions Please obtain weekly weights    Thank you for choosing [facility] !

## 2021-02-07 ENCOUNTER — Ambulatory Visit (HOSPITAL_COMMUNITY): Payer: Medicare HMO

## 2021-02-07 ENCOUNTER — Other Ambulatory Visit (HOSPITAL_COMMUNITY): Payer: Medicare HMO

## 2021-02-07 ENCOUNTER — Ambulatory Visit (HOSPITAL_COMMUNITY): Payer: Medicare HMO | Admitting: Hematology

## 2021-02-07 NOTE — Progress Notes (Signed)
[facility] Summary Report / Loop Recorder 

## 2021-02-09 ENCOUNTER — Telehealth (HOSPITAL_COMMUNITY): Payer: Self-pay | Admitting: Psychiatry

## 2021-02-09 NOTE — Telephone Encounter (Signed)
Called to schedule f/u appt, no answer and no voicemail

## 2021-02-12 ENCOUNTER — Telehealth (HOSPITAL_COMMUNITY): Payer: Medicare HMO | Admitting: Psychiatry

## 2021-02-15 ENCOUNTER — Inpatient Hospital Stay (HOSPITAL_COMMUNITY): Payer: Medicare HMO

## 2021-02-15 ENCOUNTER — Ambulatory Visit (HOSPITAL_COMMUNITY): Payer: Self-pay

## 2021-02-15 ENCOUNTER — Telehealth (HOSPITAL_COMMUNITY): Payer: Self-pay | Admitting: Hematology

## 2021-02-19 ENCOUNTER — Ambulatory Visit (INDEPENDENT_AMBULATORY_CARE_PROVIDER_SITE_OTHER): Payer: Medicare HMO

## 2021-02-19 DIAGNOSIS — I5022 Chronic systolic (congestive) heart failure: Secondary | ICD-10-CM | POA: Diagnosis not present

## 2021-02-23 LAB — CUP PACEART REMOTE DEVICE CHECK
Date Time Interrogation Session: 20220728233218
Implantable Pulse Generator Implant Date: 20210325

## 2021-03-07 IMAGING — DX DG FOOT COMPLETE 3+V*L*
3 series · 3 of 3 positions shown · non-contrast
Comparison: None.

CLINICAL DATA: Left ankle and foot pain, swelling and redness. No
known injury.

EXAM:
LEFT FOOT - COMPLETE 3+ VIEW

[foot ap]
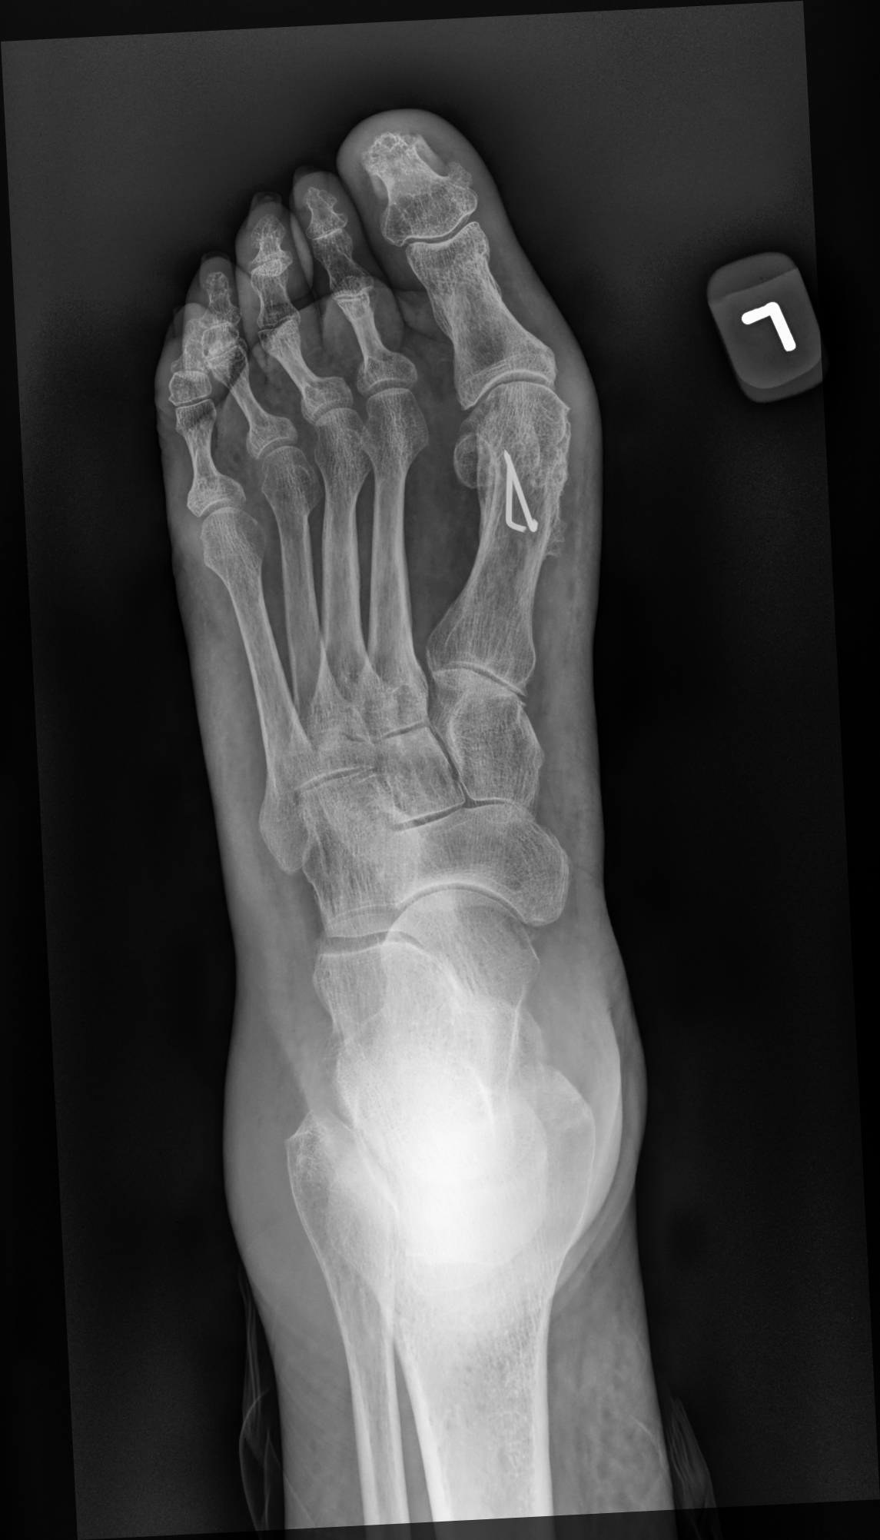

[foot mlo]
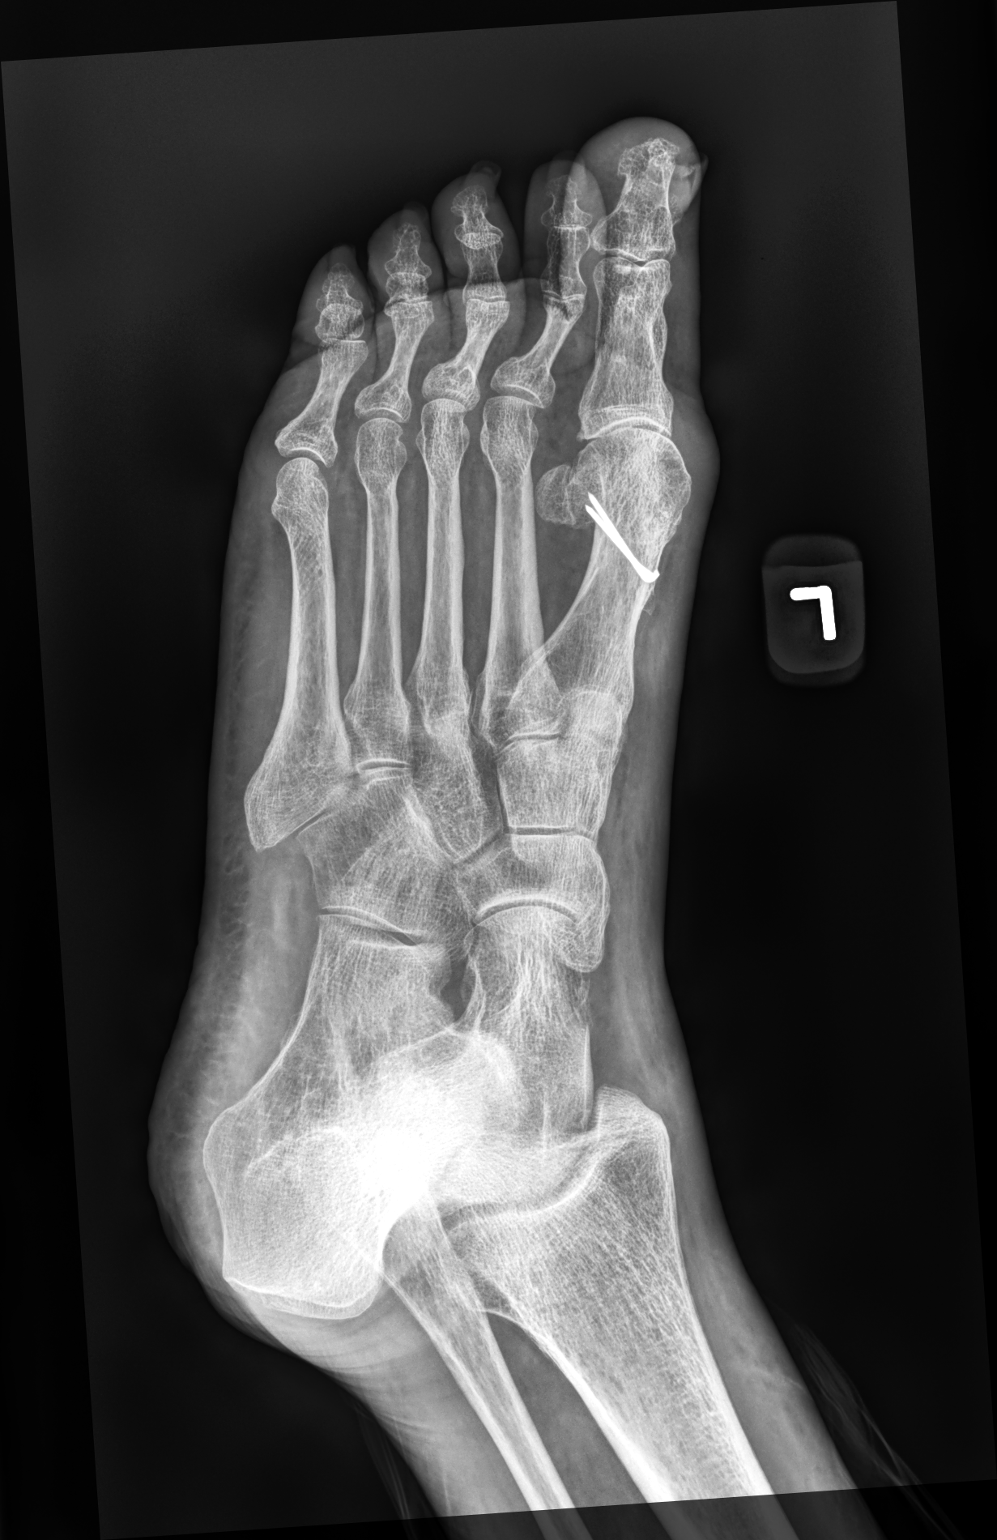

[foot lat]
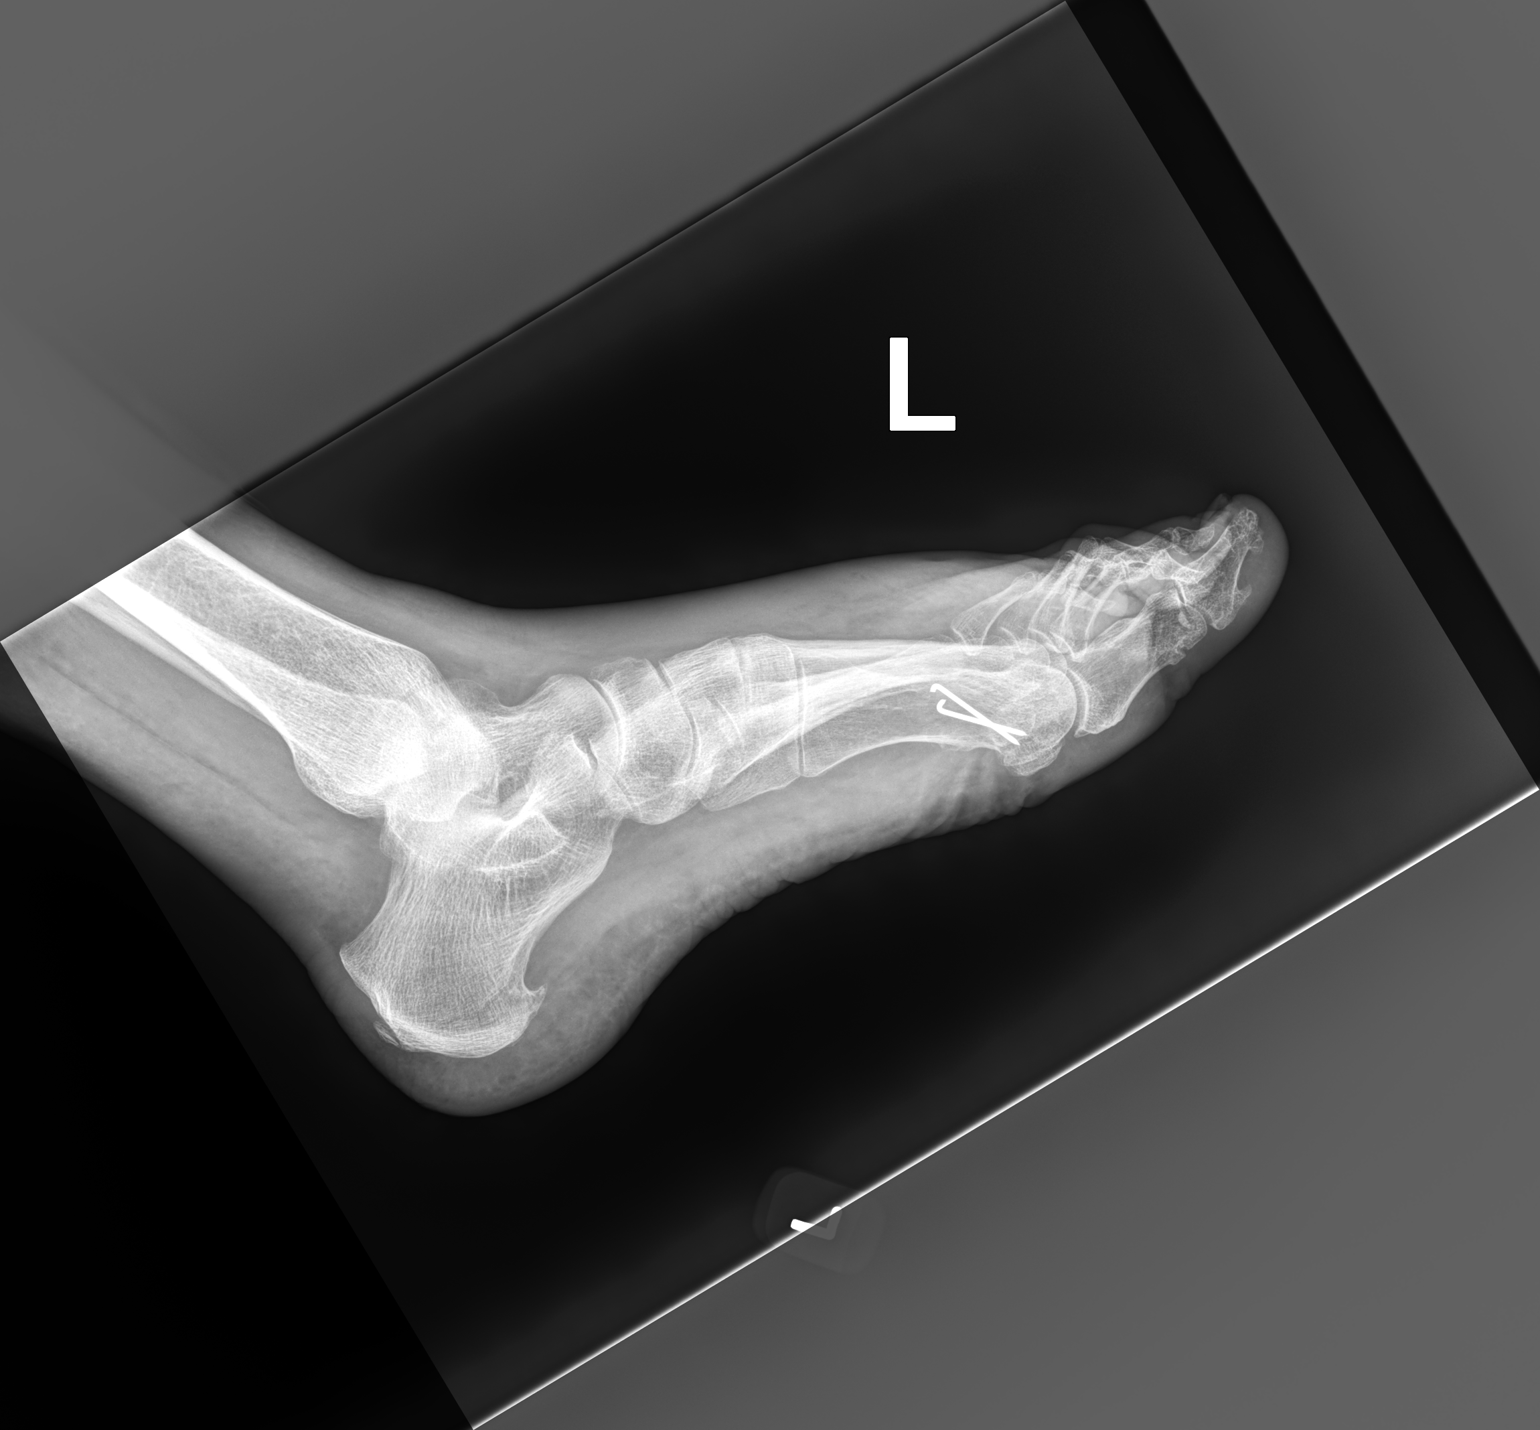

[3 of 3 positions shown; findings below may reference images not displayed]

FINDINGS: No acute bony or joint abnormality is identified. The patient is
status post hallux valgus repair. There is moderate to moderately
severe first MTP osteoarthritis. No soft tissue gas or foreign body.
Pins in the first metatarsal for osteotomy fixation noted. Calcaneal
spurring is seen.
IMPRESSION: Soft tissue swelling without underlying acute bony or joint
abnormality.

First MTP osteoarthritis.

## 2021-03-16 NOTE — Progress Notes (Signed)
[facility] Summary Report / Loop Recorder 

## 2021-03-22 ENCOUNTER — Ambulatory Visit (INDEPENDENT_AMBULATORY_CARE_PROVIDER_SITE_OTHER): Payer: Medicare HMO

## 2021-03-22 DIAGNOSIS — I639 Cerebral infarction, unspecified: Secondary | ICD-10-CM

## 2021-03-26 ENCOUNTER — Ambulatory Visit (HOSPITAL_COMMUNITY): Payer: Medicare HMO | Admitting: Hematology

## 2021-03-28 LAB — CUP PACEART REMOTE DEVICE CHECK
Date Time Interrogation Session: 20220830233822
Implantable Pulse Generator Implant Date: 20210325

## 2021-04-05 NOTE — Progress Notes (Signed)
[facility] Summary Report / Loop Recorder 

## 2021-04-16 NOTE — Progress Notes (Signed)
[facility] [address], [place] [address]   Patient Care Team: [name], MD as PCP - General (Internal Medicine) Fay Records, MD as PCP - Cardiology (Cardiology) Thompson Grayer, MD as PCP - Electrophysiology (Cardiology)  SUMMARY OF ONCOLOGIC HISTORY: Oncology History  Invasive ductal carcinoma of breast, female, right ([place])  10/09/2016 Procedure   Left breast lumpectomy by Dr. Arnoldo Morale   10/16/2016 Pathology Results   Invasive, grade 1, ductal carcinoma, spanning 0.18 cmIn greatest dimension.  Low and intermediate grade DCIS with associated calcifications.  Intraductal papilloma formation with associated low-grade DCIS.  DCIS is less than 0.1 cm from the margin in multiple areas including intermediate ductal carcinoma in situ adjacent to cauterized edge.    10/16/2016 Pathology Results   ER +100%, PR +90%, HER-2 negative.   10/23/2016 Cancer Staging   Cancer Staging Invasive ductal carcinoma of breast, female, right [facility]) Staging form: Breast, AJCC 8th Edition - Pathologic stage from 10/23/2016: Stage Unknown (pT1a, pNX, cM0, G1, ER: Positive, PR: Positive, HER2: Negative) - Signed by Baird Cancer, PA-C on 10/23/2016    11/14/2016 Imaging   CT CAP-  1. No compelling findings of metastatic breast cancer. 2. Acute mild diverticulitis of the descending colon. 3. Further distally in the descending colon but there is a cluster of small lymph nodes adjacent to the colon. Although possibly related to the diverticulitis this is a bit distend and this type of clustered nodes is unusual in this location. Following resolution of the acute process, consider colonoscopy to exclude an underlying occult cancer. 4. 3.7 cm fatty mass along the lumen of the descending colon favors a lipoma or fatty polyp. 5.  Aortic Atherosclerosis (ICD10-I70.0). 6. Minimal tree-in-bud reticulonodular opacities in the left lower lobe characteristic for atypical infectious  bronchiolitis. 7. Left groin hernia containing adipose tissue, probably a direct inguinal hernia. 8. Exaggerated lumbar lordosis with grade 1 degenerative anterolisthesis at L4-5.   11/26/2016 Imaging   Bone density- BMD as determined from [facility] Neck Left is 0.820 g/cm2 with a T-Score of -1.6.  This patient is considered OSTEOPENIC according to [facility]) criteria.     CHIEF COMPLIANT: Follow-up for right breast cancer and follicular lymphoma   INTERVAL HISTORY: Amy [name] is a 85 y.o. female here today for follow up of her right breast cancer and follicular lymphoma. Her last visit was on 08/03/2020.   Today she reports feeling good. She was hospitalized due to a fall in July. She denies pain at the site of her biopsy. She is currently living at a [facility] home in [place]. She has memory loss that waxes and wanes.  REVIEW OF SYSTEMS:   Review of Systems  Constitutional:  Negative for appetite change and fatigue (50%).  Musculoskeletal:  Positive for myalgias (7/10 Buttock).  Psychiatric/Behavioral:  Positive for confusion.   All other systems reviewed and are negative.  I have reviewed the past medical history, past surgical history, social history and family history with the patient and they are unchanged from previous note.   ALLERGIES:   is allergic to cymbalta [duloxetine hcl] and duloxetine.   MEDICATIONS:  Current Outpatient Medications  Medication Sig Dispense Refill   acetaminophen (TYLENOL) 325 MG tablet Take 2 tablets (650 mg total) by mouth every 6 (six) hours as needed for mild pain (or Fever >/= 101).     ALPRAZolam (XANAX) 0.5 MG tablet Take 1 tablet (0.5 mg total) by mouth at  bedtime. 30 tablet 2   aspirin EC 81 MG tablet Take 81 mg by mouth every morning.     calcium carbonate (OSCAL) 1500 (600 Ca) MG TABS tablet Take 600 mg of elemental calcium by mouth daily with supper.      cholecalciferol (VITAMIN D) 1000 units  tablet Take 1 tablet (1,000 Units total) by mouth daily. 30 tablet 11   donepezil (ARICEPT) 5 MG tablet Take 1 tablet (5 mg total) by mouth daily. 30 tablet 2   escitalopram (LEXAPRO) 20 MG tablet Take 1 tablet (20 mg total) by mouth daily. 30 tablet 2   feeding supplement (ENSURE ENLIVE / ENSURE PLUS) LIQD Take 237 mLs by mouth 2 (two) times daily between meals. 237 mL 12   gabapentin (NEURONTIN) 100 MG capsule Take 200 mg by mouth at bedtime.     letrozole (FEMARA) 2.5 MG tablet TAKE 1 TABLET(2.5 MG) BY MOUTH DAILY 90 tablet 3   metFORMIN (GLUCOPHAGE) 500 MG tablet Take 1 tablet (500 mg total) by mouth 2 (two) times daily with a meal. 60 tablet 0   metoprolol succinate (TOPROL-XL) 25 MG 24 hr tablet Take 1 tablet (25 mg total) by mouth daily. Please keep upcoming appt in January 2022 before anymore refills. Thank you 30 tablet 1   Multiple Vitamin (MULTIVITAMIN WITH MINERALS) TABS tablet Take 1 tablet by mouth daily.     ondansetron (ZOFRAN) 4 MG tablet Take 1 tablet (4 mg total) by mouth every 6 (six) hours as needed for nausea. 20 tablet 0   oxybutynin (DITROPAN) 5 MG tablet Take 5 mg by mouth at bedtime. (Patient not taking: Reported on 02/01/2021)     polyethylene glycol (MIRALAX / GLYCOLAX) 17 g packet Take 17 g by mouth daily as needed for mild constipation. (Patient not taking: Reported on 02/01/2021) 14 each 0   polyvinyl alcohol (LIQUIFILM TEARS) 1.4 % ophthalmic solution Place 1 drop into both eyes as needed for dry eyes. 15 mL 0   QUEtiapine (SEROQUEL) 25 MG tablet Take 1 tablet (25 mg total) by mouth 2 (two) times daily. 60 tablet 2   risperiDONE (RISPERDAL) 1 MG tablet Take 1 tablet (1 mg total) by mouth at bedtime. 30 tablet 2   No current facility-administered medications for this visit.     PHYSICAL EXAMINATION: Performance status (ECOG): 1 - Symptomatic but completely ambulatory  There were no vitals filed for this visit. Wt Readings from Last 3 Encounters:  02/01/21 161 lb  (73 kg)  11/26/20 160 lb 15 oz (73 kg)  10/09/20 161 lb (73 kg)   Physical Exam Vitals reviewed.  Constitutional:      Appearance: Normal appearance.     Comments: In wheelchair  Cardiovascular:     Rate and Rhythm: Normal rate and regular rhythm.     Pulses: Normal pulses.     Heart sounds: Normal heart sounds.  Pulmonary:     Effort: Pulmonary effort is normal.     Breath sounds: Normal breath sounds.  Lymphadenopathy:     Cervical: No cervical adenopathy.     Right cervical: No superficial, deep or posterior cervical adenopathy.    Left cervical: No superficial, deep or posterior cervical adenopathy.     Upper Body:     Right upper body: No supraclavicular, axillary or pectoral adenopathy.     Left upper body: No supraclavicular, axillary or pectoral adenopathy.     Lower Body: No left inguinal adenopathy.  Neurological:     General: No focal  deficit present.     Mental Status: She is alert and oriented to person, place, and time.  Psychiatric:        Mood and Affect: Mood normal.        Behavior: Behavior normal.    Breast Exam Chaperone: Amy [name]     LABORATORY DATA:  I have reviewed the data as listed CMP Latest Ref Rng & Units 12/01/2020 11/30/2020 11/30/2020  Glucose 70 - 99 mg/dL 180(H) 124(H) 170(H)  BUN 8 - 23 mg/dL 21 20 24(H)  Creatinine 0.44 - 1.00 mg/dL 0.76 0.87 1.03(H)  Sodium 135 - 145 mmol/L 138 139 138  Potassium 3.5 - 5.1 mmol/L 4.6 4.7 5.0  Chloride 98 - 111 mmol/L 110 109 108  CO2 22 - 32 mmol/L _0 Calcium 8.9 - 10.3 mg/dL 8.3(L) 8.0(L) 8.3(L)  Total Protein 6.5 - 8.1 g/dL - 6.2(L) -  Total Bilirubin 0.3 - 1.2 mg/dL - 0.4 -  Alkaline Phos 38 - 126 U/L - 39 -  AST 15 - 41 U/L - 19 -  ALT 0 - 44 U/L - 19 -   Lab Results  Component Value Date   CAN153 18.8 02/14/2020   CAN153 15.7 12/01/2018   Lab Results  Component Value Date   WBC 7.3 11/29/2020   HGB 13.5 11/29/2020   HCT 41.5 11/29/2020   MCV 90.4 11/29/2020   PLT 229  11/29/2020   NEUTROABS 4.8 11/29/2020    ASSESSMENT:  1.  Stage Ia right breast cancer: -Status post lumpectomy with Dr. Arnoldo Morale on 10/09/2016.  Pathology showed IDC of right breast, ER/PR positive, HER-2 negative. -Did not receive radiation.  Started on anastrozole in March 2018.  She did not take anastrozole from 05/17/2017 through 10/15/2017 likely from intolerance. -She was subsequently started on letrozole 2.5 mg daily. -Mammogram on 12/07/2019 was BI-RADS Category 2.   2.  Osteopenia: -DEXA scan on 11/26/2017 shows T score of -1.7. -Prolia every 6 months started on 12/19/2016.   3.  Hypermetabolic retroperitoneal, pelvic and left inguinal adenopathy: -PET scan on 02/14/2020 shows hypermetabolic lower retroperitoneal, common iliac and external iliac, left obturator, left pelvic sidewall, left sciatic notch and left inguinal adenopathy.  Left upper inguinal lymph node measures 2.4 cm in short axis, SUV 11.9.  Left lower inguinal lymph node SUV 13.4.  Left pelvic sidewall lymph node SUV 12.1. -Left inguinal biopsy on 11/24/7679 shows follicular lymphoma, grade 3A.  Lymphoid follicles are composed by mixture of large lymphoid cells including large cleaved lymphocytes with vesicular chromatin and small nucleoli admixed with small round to angulated lymphoid cells.  Large lymphoid cells within the follicles are present more than 15 cells per hpf.  Ki-67 is 25%. -Given her advanced age and performance status, we chose for active surveillance.   PLAN:  1.  Stage Ia right breast cancer: - She is continuing letrozole without any major problems. - Mammogram on 12/12/2020 was BI-RADS Category 2. - Her dementia has gotten worse.  She is at [facility] home in [place].   2.  Osteopenia: - Calcium today is 9.8.  She will continue Prolia every 6 months.  Continue calcium and vitamin D supplements.   3.  Stage III A follicular lymphoma, large cell type (grade 3A): - No B symptoms.  No significant  lymphadenopathy.  Small lymph nodes in the axillary area. - Reviewed labs today which showed normal LDH.  No significant cytopenias.  No indication for treatment.  Breast Cancer therapy associated  bone loss: I have recommended calcium, Vitamin D and weight bearing exercises.  Orders placed this encounter:  No orders of the defined types were placed in this encounter.   The patient has a good understanding of the overall plan. She agrees with it. She will call with any problems that may develop before the next visit here.  Derek Jack, MD Amy [name] [phone]   I, Amy [name], am acting as a scribe for Dr. Derek Jack.  I, Derek Jack MD, have reviewed the above documentation for accuracy and completeness, and I agree with the above.

## 2021-04-17 ENCOUNTER — Inpatient Hospital Stay (HOSPITAL_COMMUNITY): Payer: Medicare HMO

## 2021-04-17 ENCOUNTER — Inpatient Hospital Stay (HOSPITAL_COMMUNITY): Payer: Medicare HMO | Attending: Hematology | Admitting: Hematology

## 2021-04-17 ENCOUNTER — Other Ambulatory Visit: Payer: Self-pay

## 2021-04-17 VITALS — BP 164/86 | HR 61 | Temp 96.7°F | Resp 16 | Wt 150.1 lb

## 2021-04-17 DIAGNOSIS — C50911 Malignant neoplasm of unspecified site of right female breast: Secondary | ICD-10-CM

## 2021-04-17 DIAGNOSIS — R590 Localized enlarged lymph nodes: Secondary | ICD-10-CM | POA: Insufficient documentation

## 2021-04-17 DIAGNOSIS — Z7984 Long term (current) use of oral hypoglycemic drugs: Secondary | ICD-10-CM | POA: Insufficient documentation

## 2021-04-17 DIAGNOSIS — Z79899 Other long term (current) drug therapy: Secondary | ICD-10-CM | POA: Insufficient documentation

## 2021-04-17 DIAGNOSIS — M8580 Other specified disorders of bone density and structure, unspecified site: Secondary | ICD-10-CM | POA: Diagnosis not present

## 2021-04-17 DIAGNOSIS — Z79811 Long term (current) use of aromatase inhibitors: Secondary | ICD-10-CM | POA: Insufficient documentation

## 2021-04-17 DIAGNOSIS — Z170 Estrogen receptor positive status [ER+]: Secondary | ICD-10-CM | POA: Diagnosis not present

## 2021-04-17 DIAGNOSIS — M818 Other osteoporosis without current pathological fracture: Secondary | ICD-10-CM

## 2021-04-17 LAB — CBC WITH DIFFERENTIAL/PLATELET
Abs Immature Granulocytes: 0.02 10*3/uL (ref 0.00–0.07)
Basophils Absolute: 0 10*3/uL (ref 0.0–0.1)
Basophils Relative: 0 %
Eosinophils Absolute: 0.2 10*3/uL (ref 0.0–0.5)
Eosinophils Relative: 2 %
HCT: 35.7 % — ABNORMAL LOW (ref 36.0–46.0)
Hemoglobin: 11.2 g/dL — ABNORMAL LOW (ref 12.0–15.0)
Immature Granulocytes: 0 %
Lymphocytes Relative: 21 %
Lymphs Abs: 1.5 10*3/uL (ref 0.7–4.0)
MCH: 29.3 pg (ref 26.0–34.0)
MCHC: 31.4 g/dL (ref 30.0–36.0)
MCV: 93.5 fL (ref 80.0–100.0)
Monocytes Absolute: 0.5 10*3/uL (ref 0.1–1.0)
Monocytes Relative: 6 %
Neutro Abs: 5 10*3/uL (ref 1.7–7.7)
Neutrophils Relative %: 71 %
Platelets: 176 10*3/uL (ref 150–400)
RBC: 3.82 MIL/uL — ABNORMAL LOW (ref 3.87–5.11)
RDW: 13 % (ref 11.5–15.5)
WBC: 7.1 10*3/uL (ref 4.0–10.5)
nRBC: 0 % (ref 0.0–0.2)

## 2021-04-17 LAB — COMPREHENSIVE METABOLIC PANEL
ALT: 13 U/L (ref 0–44)
AST: 22 U/L (ref 15–41)
Albumin: 3.9 g/dL (ref 3.5–5.0)
Alkaline Phosphatase: 52 U/L (ref 38–126)
Anion gap: 4 — ABNORMAL LOW (ref 5–15)
BUN: 21 mg/dL (ref 8–23)
CO2: 30 mmol/L (ref 22–32)
Calcium: 9.8 mg/dL (ref 8.9–10.3)
Chloride: 102 mmol/L (ref 98–111)
Creatinine, Ser: 0.96 mg/dL (ref 0.44–1.00)
GFR, Estimated: 59 mL/min — ABNORMAL LOW (ref >60)
Glucose, Bld: 84 mg/dL (ref 70–99)
Potassium: 4.1 mmol/L (ref 3.5–5.1)
Sodium: 136 mmol/L (ref 135–145)
Total Bilirubin: 0.5 mg/dL (ref 0.3–1.2)
Total Protein: 6.6 g/dL (ref 6.5–8.1)

## 2021-04-17 LAB — LACTATE DEHYDROGENASE: LDH: 146 U/L (ref 98–192)

## 2021-04-17 MED ORDER — DENOSUMAB 60 MG/ML ~~LOC~~ SOSY
60.0000 mg | PREFILLED_SYRINGE | Freq: Once | SUBCUTANEOUS | Status: AC
  Administered 2021-04-17: 60 mg via SUBCUTANEOUS
  Filled 2021-04-17: qty 1

## 2021-04-17 NOTE — Progress Notes (Signed)
Patient presents today for Prolia, okay for injection per Dr. Delton Coombes. Patient taking calcium as directed. Denied tooth, jaw, and leg pain. No recent or upcoming dental visits. Labs reviewed. Patient tolerated injection with no complaints voiced. See MAR for details. Patient stable during and after injection. Site clean and dry with no bruising or swelling noted. Band aid applied. Vss with discharge and left in satisfactory condition with no s/s of distress.

## 2021-04-17 NOTE — Patient Instructions (Signed)
[facility]  Discharge Instructions: Thank you for choosing [facility] to provide your oncology and hematology care.  If you have a lab appointment with the [facility], please come in thru the Main Entrance and check in at the main information desk.  Wear comfortable clothing and clothing appropriate for easy access to any Portacath or PICC line.   We strive to give you quality time with your provider. You may need to reschedule your appointment if you arrive late (15 or more minutes).  Arriving late affects you and other patients whose appointments are after yours.  Also, if you miss three or more appointments without notifying the office, you may be dismissed from the clinic at the provider's discretion.      For prescription refill requests, have your pharmacy contact our office and allow 72 hours for refills to be completed.    Today you received the following chemotherapy and/or immunotherapy agents Prolia, return as scheduled.    To help prevent nausea and vomiting after your treatment, we encourage you to take your nausea medication as directed.  BELOW ARE SYMPTOMS THAT SHOULD BE REPORTED IMMEDIATELY: *FEVER GREATER THAN 100.4 F (38 C) OR HIGHER *CHILLS OR SWEATING *NAUSEA AND VOMITING THAT IS NOT CONTROLLED WITH YOUR NAUSEA MEDICATION *UNUSUAL SHORTNESS OF BREATH *UNUSUAL BRUISING OR BLEEDING *URINARY PROBLEMS (pain or burning when urinating, or frequent urination) *BOWEL PROBLEMS (unusual diarrhea, constipation, pain near the anus) TENDERNESS IN MOUTH AND THROAT WITH OR WITHOUT PRESENCE OF ULCERS (sore throat, sores in mouth, or a toothache) UNUSUAL RASH, SWELLING OR PAIN  UNUSUAL VAGINAL DISCHARGE OR ITCHING   Items with * indicate a potential emergency and should be followed up as soon as possible or go to the Emergency Department if any problems should occur.  Please show the CHEMOTHERAPY ALERT CARD or IMMUNOTHERAPY ALERT CARD at check-in to the  Emergency Department and triage nurse.  Should you have questions after your visit or need to cancel or reschedule your appointment, please contact [facility] [phone]  and follow the prompts.  Office hours are 8:00 a.m. to 4:30 p.m. Monday - Friday. Please note that voicemails left after 4:00 p.m. may not be returned until the following business day.  We are closed weekends and major holidays. You have access to a nurse at all times for urgent questions. Please call the main number to the clinic [phone] and follow the prompts.  For any non-urgent questions, you may also contact your provider using MyChart. We now offer e-Visits for anyone 26 and older to request care online for non-urgent symptoms. For details visit [email or web address].   Also download the MyChart app! Go to the app store, search "MyChart", open the app, select [facility], and log in with your MyChart username and password.  Due to Covid, a mask is required upon entering the hospital/clinic. If you do not have a mask, one will be given to you upon arrival. For doctor visits, patients may have 1 support person aged 83 or older with them. For treatment visits, patients cannot have anyone with them due to current Covid guidelines and our immunocompromised population.

## 2021-04-17 NOTE — Patient Instructions (Addendum)
[facility] at [facility] Discharge Instructions  You were seen today by Dr. Delton Coombes. He went over your recent results, and you received your injection. Dr. Delton Coombes will see you back in 6 months for labs and follow up.   Thank you for choosing [facility] at [facility] to provide your oncology and hematology care.  To afford each patient quality time with our provider, please arrive at least 15 minutes before your scheduled appointment time.   If you have a lab appointment with the [facility] please come in thru the Main Entrance and check in at the main information desk  You need to re-schedule your appointment should you arrive 10 or more minutes late.  We strive to give you quality time with our providers, and arriving late affects you and other patients whose appointments are after yours.  Also, if you no show three or more times for appointments you may be dismissed from the clinic at the providers discretion.     Again, thank you for choosing [facility].  Our hope is that these requests will decrease the amount of time that you wait before being seen by our physicians.       _____________________________________________________________  Should you have questions after your visit to [facility], please contact our office at [phone] between the hours of 8:00 a.m. and 4:30 p.m.  Voicemails left after 4:00 p.m. will not be returned until the following business day.  For prescription refill requests, have your pharmacy contact our office and allow 72 hours.    Cancer Center Support Programs:   > Cancer Support Group  2nd Tuesday of the month 1pm-2pm, Journey Room

## 2021-04-17 NOTE — Progress Notes (Signed)
Patient has been assessed, vital signs and labs have been reviewed by Dr. Delton Coombes. ANC, Creatinine, LFTs, and Platelets are within treatment parameters per Dr. Delton Coombes. The patient is good to proceed with Prolia at this time.  Primary RN and pharmacy aware.

## 2021-04-30 ENCOUNTER — Ambulatory Visit (INDEPENDENT_AMBULATORY_CARE_PROVIDER_SITE_OTHER): Payer: Medicare HMO

## 2021-04-30 DIAGNOSIS — I639 Cerebral infarction, unspecified: Secondary | ICD-10-CM

## 2021-04-30 LAB — CUP PACEART REMOTE DEVICE CHECK
Date Time Interrogation Session: 20221002234340
Implantable Pulse Generator Implant Date: 20210325

## 2021-05-08 NOTE — Progress Notes (Signed)
[facility] Summary Report / Loop Recorder 

## 2021-06-04 ENCOUNTER — Ambulatory Visit (INDEPENDENT_AMBULATORY_CARE_PROVIDER_SITE_OTHER): Payer: Medicare HMO

## 2021-06-04 DIAGNOSIS — I639 Cerebral infarction, unspecified: Secondary | ICD-10-CM | POA: Diagnosis not present

## 2021-06-04 LAB — CUP PACEART REMOTE DEVICE CHECK
Date Time Interrogation Session: 20221104234717
Implantable Pulse Generator Implant Date: 20210325

## 2021-06-12 NOTE — Progress Notes (Signed)
[facility] Summary Report / Loop Recorder 

## 2021-07-05 LAB — CUP PACEART REMOTE DEVICE CHECK
Date Time Interrogation Session: 20221207224812
Implantable Pulse Generator Implant Date: 20210325

## 2021-07-09 ENCOUNTER — Ambulatory Visit (INDEPENDENT_AMBULATORY_CARE_PROVIDER_SITE_OTHER): Payer: Medicare HMO

## 2021-07-09 DIAGNOSIS — I639 Cerebral infarction, unspecified: Secondary | ICD-10-CM

## 2021-07-19 NOTE — Progress Notes (Signed)
[facility] Summary Report / Loop Recorder 

## 2021-07-24 ENCOUNTER — Telehealth: Payer: Self-pay

## 2021-07-24 NOTE — Telephone Encounter (Signed)
Dicussed options of explant or leave device in with son.  States patient is currently in the hospital at [facility], [place] and currently resides at a memory care unit. Would like to leave device in per son. Advised if further questions or concerns arise to please feel free to reach out. Appreciative of follow up call .

## 2021-07-24 NOTE — Telephone Encounter (Signed)
Patient son called in stating his mother is in memory care and no longer wishes to do remotes anymore. I have canceled all appointments and taken her out of [facility]. Patient would like a call back from a nurse to discuss if they should have it taken out or leave in

## 2021-09-11 ENCOUNTER — Telehealth (HOSPITAL_COMMUNITY): Payer: Self-pay | Admitting: Psychiatry

## 2021-10-10 ENCOUNTER — Encounter (HOSPITAL_COMMUNITY): Payer: Self-pay

## 2021-10-10 NOTE — Progress Notes (Signed)
Notification received that patient is currently in hospice care. All future appts with Dr. Delton Coombes cancelled. ?

## 2021-10-17 ENCOUNTER — Ambulatory Visit (HOSPITAL_COMMUNITY): Payer: Medicare HMO

## 2021-10-17 ENCOUNTER — Ambulatory Visit (HOSPITAL_COMMUNITY): Payer: Medicare HMO | Admitting: Hematology

## 2021-10-17 ENCOUNTER — Inpatient Hospital Stay (HOSPITAL_COMMUNITY): Payer: Medicare HMO

## 2022-09-26 ENCOUNTER — Encounter: Payer: Self-pay | Admitting: Radiology

## 2023-04-29 DEATH — deceased
